# Patient Record
Sex: Male | Born: 1980 | Race: Black or African American | Hispanic: No | Smoking: Never smoker
Health system: Southern US, Community
[De-identification: ages and names within clinical notes are randomized; demographics above are authoritative.]

## PROBLEM LIST (undated history)

## (undated) DIAGNOSIS — E669 Obesity, unspecified: Secondary | ICD-10-CM

## (undated) DIAGNOSIS — K648 Other hemorrhoids: Secondary | ICD-10-CM

## (undated) DIAGNOSIS — K626 Ulcer of anus and rectum: Secondary | ICD-10-CM

## (undated) DIAGNOSIS — B20 Human immunodeficiency virus [HIV] disease: Secondary | ICD-10-CM

## (undated) DIAGNOSIS — Z21 Asymptomatic human immunodeficiency virus [HIV] infection status: Secondary | ICD-10-CM

## (undated) DIAGNOSIS — C8378 Burkitt lymphoma, lymph nodes of multiple sites: Secondary | ICD-10-CM

## (undated) DIAGNOSIS — K602 Anal fissure, unspecified: Secondary | ICD-10-CM

## (undated) HISTORY — DX: Obesity, unspecified: E66.9

## (undated) HISTORY — DX: Ulcer of anus and rectum: K62.6

## (undated) HISTORY — DX: Human immunodeficiency virus (HIV) disease: B20

## (undated) HISTORY — DX: Other hemorrhoids: K64.8

## (undated) HISTORY — DX: Anal fissure, unspecified: K60.2

## (undated) HISTORY — DX: Asymptomatic human immunodeficiency virus (hiv) infection status: Z21

---

## 2004-04-05 ENCOUNTER — Emergency Department (HOSPITAL_COMMUNITY): Admission: EM | Admit: 2004-04-05 | Discharge: 2004-04-05 | Payer: Self-pay | Admitting: Emergency Medicine

## 2005-04-18 ENCOUNTER — Emergency Department (HOSPITAL_COMMUNITY): Admission: EM | Admit: 2005-04-18 | Discharge: 2005-04-18 | Payer: Self-pay | Admitting: Emergency Medicine

## 2006-11-10 ENCOUNTER — Emergency Department (HOSPITAL_COMMUNITY): Admission: EM | Admit: 2006-11-10 | Discharge: 2006-11-11 | Payer: Self-pay | Admitting: Emergency Medicine

## 2007-03-11 ENCOUNTER — Emergency Department (HOSPITAL_COMMUNITY): Admission: EM | Admit: 2007-03-11 | Discharge: 2007-03-11 | Payer: Self-pay | Admitting: Emergency Medicine

## 2008-10-15 ENCOUNTER — Emergency Department (HOSPITAL_COMMUNITY): Admission: EM | Admit: 2008-10-15 | Discharge: 2008-10-15 | Payer: Self-pay | Admitting: Emergency Medicine

## 2010-08-30 ENCOUNTER — Emergency Department (HOSPITAL_COMMUNITY): Admission: EM | Admit: 2010-08-30 | Discharge: 2010-08-30 | Payer: Self-pay | Admitting: Family Medicine

## 2011-02-27 ENCOUNTER — Inpatient Hospital Stay (INDEPENDENT_AMBULATORY_CARE_PROVIDER_SITE_OTHER)
Admission: RE | Admit: 2011-02-27 | Discharge: 2011-02-27 | Disposition: A | Discharge status: Home / Self Care | Payer: Self-pay | Source: Ambulatory Visit | Attending: Family Medicine | Admitting: Family Medicine

## 2011-02-27 DIAGNOSIS — B9789 Other viral agents as the cause of diseases classified elsewhere: Secondary | ICD-10-CM

## 2011-02-27 DIAGNOSIS — K5289 Other specified noninfective gastroenteritis and colitis: Secondary | ICD-10-CM

## 2011-09-16 LAB — URINALYSIS, ROUTINE W REFLEX MICROSCOPIC
Bilirubin Urine: NEGATIVE
Hgb urine dipstick: NEGATIVE
Ketones, ur: NEGATIVE
Specific Gravity, Urine: 1.033 — ABNORMAL HIGH

## 2011-09-16 LAB — HERPES SIMPLEX VIRUS CULTURE: Culture: DETECTED

## 2012-06-03 ENCOUNTER — Emergency Department (HOSPITAL_COMMUNITY)
Admission: EM | Admit: 2012-06-03 | Discharge: 2012-06-04 | Disposition: A | Discharge status: Home / Self Care | Payer: No Typology Code available for payment source | Attending: Emergency Medicine | Admitting: Emergency Medicine

## 2012-06-03 ENCOUNTER — Emergency Department (HOSPITAL_COMMUNITY): Payer: No Typology Code available for payment source

## 2012-06-03 ENCOUNTER — Encounter (HOSPITAL_COMMUNITY): Payer: Self-pay

## 2012-06-03 DIAGNOSIS — M549 Dorsalgia, unspecified: Secondary | ICD-10-CM | POA: Insufficient documentation

## 2012-06-03 DIAGNOSIS — S161XXA Strain of muscle, fascia and tendon at neck level, initial encounter: Secondary | ICD-10-CM

## 2012-06-03 DIAGNOSIS — S20219A Contusion of unspecified front wall of thorax, initial encounter: Secondary | ICD-10-CM

## 2012-06-03 MED ORDER — IBUPROFEN 400 MG PO TABS
800.0000 mg | ORAL_TABLET | Freq: Once | ORAL | Status: AC
  Administered 2012-06-04: 800 mg via ORAL
  Filled 2012-06-03: qty 2

## 2012-06-03 MED ORDER — OXYCODONE-ACETAMINOPHEN 5-325 MG PO TABS
2.0000 | ORAL_TABLET | Freq: Once | ORAL | Status: AC
  Administered 2012-06-04: 2 via ORAL
  Filled 2012-06-03: qty 2

## 2012-06-03 NOTE — ED Notes (Signed)
Back pain and cramping in muscles, sts migraine headache.
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
## 2012-06-04 MED ORDER — IBUPROFEN 800 MG PO TABS: 600.0000 mg | ORAL_TABLET | Freq: Once | ORAL | Status: AC

## 2012-06-04 MED ORDER — OXYCODONE-ACETAMINOPHEN 5-325 MG PO TABS: 2.0000 | ORAL_TABLET | Freq: Four times a day (QID) | ORAL | Status: AC | PRN

## 2012-06-04 MED ORDER — CYCLOBENZAPRINE HCL 10 MG PO TABS: 5.0000 mg | ORAL_TABLET | Freq: Three times a day (TID) | ORAL | Status: AC | PRN

## 2012-06-04 NOTE — Discharge Instructions (Signed)
Your x-rays are all negative for any fractures, subluxation, or dislocations.  U. been placed in a soft cervical collar for comfort only.  Wear this for the next one to 2 days.  Pupils of the given a prescription for a nonsteroidal as well as a narcotic and muscle relaxer

## 2012-06-04 NOTE — ED Provider Notes (Signed)
Medical screening examination/treatment/procedure(s) were performed by non-physician practitioner and as supervising physician I was immediately available for consultation/collaboration.   Sallee Hogrefe, MD 06/04/12 0600 

## 2012-10-05 ENCOUNTER — Emergency Department (INDEPENDENT_AMBULATORY_CARE_PROVIDER_SITE_OTHER): Payer: Self-pay

## 2012-10-05 ENCOUNTER — Other Ambulatory Visit: Payer: Self-pay

## 2012-10-05 ENCOUNTER — Emergency Department (INDEPENDENT_AMBULATORY_CARE_PROVIDER_SITE_OTHER)
Admission: EM | Admit: 2012-10-05 | Discharge: 2012-10-05 | Disposition: A | Discharge status: Home / Self Care | Payer: Self-pay | Source: Home / Self Care | Attending: Family Medicine | Admitting: Family Medicine

## 2012-10-05 ENCOUNTER — Encounter (HOSPITAL_COMMUNITY): Payer: Self-pay | Admitting: *Deleted

## 2012-10-05 DIAGNOSIS — B349 Viral infection, unspecified: Secondary | ICD-10-CM

## 2012-10-05 DIAGNOSIS — B9789 Other viral agents as the cause of diseases classified elsewhere: Secondary | ICD-10-CM

## 2012-10-05 MED ORDER — HYDROCOD POLST-CHLORPHEN POLST 10-8 MG/5ML PO LQCR: 5.0000 mL | Freq: Two times a day (BID) | ORAL | Status: DC

## 2012-10-05 MED ORDER — ACETAMINOPHEN 325 MG PO TABS
ORAL_TABLET | ORAL | Status: AC
  Filled 2012-10-05: qty 2

## 2012-10-05 NOTE — ED Notes (Signed)
Re-checked patient's temp after x-ray was 103.1 RN and MD made aware

## 2012-10-05 NOTE — ED Provider Notes (Signed)
History     CSN: [id number]  Arrival date & time 10/05/12  1654   First MD Initiated Contact with Patient 10/05/12 1700      Chief Complaint  Patient presents with  . Chest Pain    (Consider location/radiation/quality/duration/timing/severity/associated sxs/prior treatment) Patient is a 30 y.o. male presenting with cough. The history is provided by the patient.  Cough This is a new problem. The current episode started 6 to 12 hours ago. The cough is non-productive. The maximum temperature recorded prior to his arrival was 100 to 100.9 F. The fever has been present for less than 1 day. Associated symptoms include chest pain and shortness of breath. Pertinent negatives include no rhinorrhea.    History reviewed. No pertinent past medical history.  History reviewed. No pertinent past surgical history.  Family History  Problem Relation Age of Onset  . Family history unknown: Yes    History  Substance Use Topics  . Smoking status: Never Smoker   . Smokeless tobacco: Not on file  . Alcohol Use: No      Review of Systems  Constitutional: Positive for fever.  HENT: Negative for congestion, rhinorrhea and postnasal drip.   Respiratory: Positive for cough and shortness of breath.   Cardiovascular: Positive for chest pain.  Gastrointestinal: Negative.     Allergies  Review of patient's allergies indicates no known allergies.  Home Medications   Current Outpatient Rx  Name Route Sig Dispense Refill  . HYDROCOD POLST-CPM POLST ER 10-8 MG/5ML PO LQCR Oral Take 5 mLs by mouth every 12 (twelve) hours. 115 mL 0    BP 123/64  Pulse 88  Temp 103.1 F (39.5 C) (Oral)  Resp 12  SpO2 99%  Physical Exam  Nursing note and vitals reviewed. Constitutional: He is oriented to person, place, and time. He appears well-developed and well-nourished.  HENT:  Head: Normocephalic.  Right Ear: External ear normal.  Left Ear: External ear normal.  Mouth/Throat: Oropharynx is clear  and moist.  Eyes: Conjunctivae normal are normal. Pupils are equal, round, and reactive to light.  Neck: Normal range of motion. Neck supple.  Cardiovascular: Normal rate, regular rhythm, normal heart sounds and intact distal pulses.   Pulmonary/Chest: Effort normal and breath sounds normal. He exhibits tenderness.  Abdominal: Soft. Bowel sounds are normal.  Lymphadenopathy:    He has no cervical adenopathy.  Neurological: He is alert and oriented to person, place, and time.  Skin: Skin is warm and dry.    ED Course  Procedures (including critical care time)  Labs Reviewed - No data to display Dg Chest 2 View  10/05/2012  *RADIOLOGY REPORT*  Clinical Data: Chest pain and fever  CHEST - 2 VIEW  Comparison: None.  Findings: Normal mediastinum and cardiac silhouette.  Normal pulmonary  vasculature.  No evidence of effusion, infiltrate, or pneumothorax.  No acute bony abnormality.  IMPRESSION: No acute cardiopulmonary process.   Original Report Authenticated By: Genevive Bi, M.D.      1. Viral syndrome       MDM  X-rays reviewed and report per radiologist.         Linna Hoff, MD 10/05/12 [phone]

## 2012-10-05 NOTE — ED Notes (Signed)
Pt given tylenol per vo from dr. Melrose Nakayama - temp 102.3

## 2012-10-05 NOTE — ED Notes (Signed)
Pt reports chest pain  - started at 630 am in the right arm - denies n/v/d/radiating pain

## 2013-02-02 ENCOUNTER — Emergency Department (HOSPITAL_COMMUNITY)
Admission: EM | Admit: 2013-02-02 | Discharge: 2013-02-02 | Disposition: A | Discharge status: Home / Self Care | Payer: No Typology Code available for payment source | Attending: Emergency Medicine | Admitting: Emergency Medicine

## 2013-02-02 ENCOUNTER — Emergency Department (HOSPITAL_COMMUNITY): Payer: No Typology Code available for payment source

## 2013-02-02 ENCOUNTER — Encounter (HOSPITAL_COMMUNITY): Payer: Self-pay | Admitting: Emergency Medicine

## 2013-02-02 DIAGNOSIS — S0990XA Unspecified injury of head, initial encounter: Secondary | ICD-10-CM | POA: Insufficient documentation

## 2013-02-02 DIAGNOSIS — Y92410 Unspecified street and highway as the place of occurrence of the external cause: Secondary | ICD-10-CM | POA: Insufficient documentation

## 2013-02-02 DIAGNOSIS — S335XXA Sprain of ligaments of lumbar spine, initial encounter: Secondary | ICD-10-CM | POA: Insufficient documentation

## 2013-02-02 DIAGNOSIS — S39012A Strain of muscle, fascia and tendon of lower back, initial encounter: Secondary | ICD-10-CM

## 2013-02-02 DIAGNOSIS — Y9389 Activity, other specified: Secondary | ICD-10-CM | POA: Insufficient documentation

## 2013-02-02 MED ORDER — CYCLOBENZAPRINE HCL 10 MG PO TABS: 10.0000 mg | ORAL_TABLET | Freq: Two times a day (BID) | ORAL | Status: DC | PRN

## 2013-02-02 MED ORDER — NAPROXEN 500 MG PO TABS: 500.0000 mg | ORAL_TABLET | Freq: Two times a day (BID) | ORAL | Status: DC
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
## 2013-02-03 NOTE — ED Provider Notes (Signed)
Medical screening examination/treatment/procedure(s) were performed by non-physician practitioner and as supervising physician I was immediately available for consultation/collaboration.  Esmerelda Finnigan T Zurisadai Helminiak, MD 02/03/13 2351 

## 2013-04-14 ENCOUNTER — Encounter (HOSPITAL_COMMUNITY): Payer: Self-pay | Admitting: *Deleted

## 2013-04-14 ENCOUNTER — Emergency Department (HOSPITAL_COMMUNITY)
Admission: EM | Admit: 2013-04-14 | Discharge: 2013-04-14 | Disposition: A | Discharge status: Home / Self Care | Payer: No Typology Code available for payment source | Attending: Emergency Medicine | Admitting: Emergency Medicine

## 2013-04-14 DIAGNOSIS — S335XXA Sprain of ligaments of lumbar spine, initial encounter: Secondary | ICD-10-CM | POA: Insufficient documentation

## 2013-04-14 DIAGNOSIS — S298XXA Other specified injuries of thorax, initial encounter: Secondary | ICD-10-CM | POA: Insufficient documentation

## 2013-04-14 DIAGNOSIS — Y92410 Unspecified street and highway as the place of occurrence of the external cause: Secondary | ICD-10-CM | POA: Insufficient documentation

## 2013-04-14 DIAGNOSIS — S39012A Strain of muscle, fascia and tendon of lower back, initial encounter: Secondary | ICD-10-CM

## 2013-04-14 DIAGNOSIS — Y9389 Activity, other specified: Secondary | ICD-10-CM | POA: Insufficient documentation

## 2013-04-14 MED ORDER — NAPROXEN 500 MG PO TABS: 500.0000 mg | ORAL_TABLET | Freq: Two times a day (BID) | ORAL | Status: DC

## 2013-04-14 MED ORDER — METHOCARBAMOL 500 MG PO TABS: 500.0000 mg | ORAL_TABLET | Freq: Two times a day (BID) | ORAL | Status: DC
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
## 2013-04-15 NOTE — ED Provider Notes (Signed)
Medical screening examination/treatment/procedure(s) were performed by non-physician practitioner and as supervising physician I was immediately available for consultation/collaboration.   Kirby Cortese, MD 04/15/13 0011 

## 2013-06-16 ENCOUNTER — Encounter (HOSPITAL_COMMUNITY): Payer: Self-pay | Admitting: *Deleted

## 2013-06-16 ENCOUNTER — Emergency Department (INDEPENDENT_AMBULATORY_CARE_PROVIDER_SITE_OTHER)
Admission: EM | Admit: 2013-06-16 | Discharge: 2013-06-16 | Disposition: A | Discharge status: Home / Self Care | Payer: Self-pay | Source: Home / Self Care | Attending: Emergency Medicine | Admitting: Emergency Medicine

## 2013-06-16 DIAGNOSIS — B9789 Other viral agents as the cause of diseases classified elsewhere: Secondary | ICD-10-CM

## 2013-06-16 DIAGNOSIS — B349 Viral infection, unspecified: Secondary | ICD-10-CM

## 2013-06-16 NOTE — ED Notes (Signed)
Reports waking this morning with some nausea.  Had one episode of vomiting this afternoon.  Denies any other sxs, pain, diarrhea, fevers.  Last BM today - had 2 normal BMs.

## 2013-06-16 NOTE — ED Provider Notes (Signed)
   History    CSN: [id number] Arrival date & time 06/16/13  1747  First MD Initiated Contact with Patient 06/16/13 1819     Chief Complaint  Patient presents with  . Nausea   (Consider location/radiation/quality/duration/timing/severity/associated sxs/prior Treatment) Patient is a 32 y.o. male presenting with vomiting. The history is provided by the patient. No language interpreter was used.  Emesis Severity:  Moderate Timing:  Constant Number of daily episodes:  1 Progression:  Worsening Chronicity:  New Recent urination:  Normal Relieved by:  Nothing Worsened by:  Nothing tried Pt reports he vomitted once.   Pt reports he ate several slices of pizza and feels better.  No further vomitting History reviewed. No pertinent past medical history. History reviewed. No pertinent past surgical history. No family history on file. History  Substance Use Topics  . Smoking status: Never Smoker   . Smokeless tobacco: Not on file  . Alcohol Use: No    Review of Systems  Gastrointestinal: Positive for vomiting.  All other systems reviewed and are negative.    Allergies  Review of patient's allergies indicates no known allergies.  Home Medications   Current Outpatient Rx  Name  Route  Sig  Dispense  Refill  . acetaminophen (TYLENOL) 500 MG tablet   Oral   Take 1,000 mg by mouth every 6 (six) hours as needed for pain.         . methocarbamol (ROBAXIN) 500 MG tablet   Oral   Take 1 tablet (500 mg total) by mouth 2 (two) times daily.   20 tablet   0   . methocarbamol (ROBAXIN) 500 MG tablet   Oral   Take 1 tablet (500 mg total) by mouth 2 (two) times daily.   20 tablet   0   . naproxen (NAPROSYN) 500 MG tablet   Oral   Take 1 tablet (500 mg total) by mouth 2 (two) times daily with a meal.   30 tablet   0   . naproxen (NAPROSYN) 500 MG tablet   Oral   Take 1 tablet (500 mg total) by mouth 2 (two) times daily with a meal.   30 tablet   0    BP 120/66  Pulse 67   Temp(Src) 98.4 F (36.9 C) (Oral)  Resp 20  SpO2 100% Physical Exam  Nursing note and vitals reviewed. Constitutional: He is oriented to person, place, and time. He appears well-developed and well-nourished.  HENT:  Head: Normocephalic and atraumatic.  Eyes: Pupils are equal, round, and reactive to light.  Neck: Normal range of motion.  Cardiovascular: Normal rate.   Pulmonary/Chest: Effort normal.  Abdominal: Soft.  Musculoskeletal: Normal range of motion.  Neurological: He is alert and oriented to person, place, and time. He has normal reflexes.  Skin: Skin is warm.  Psychiatric: He has a normal mood and affect.    ED Course  Procedures (including critical care time) Labs Reviewed - No data to display No results found. 1. Viral illness     MDM  Pt advised clear liquids x 12 hours.   Return if any problems.  Lonia Skinner [place], PA-C 06/16/13 1901

## 2013-06-16 NOTE — ED Provider Notes (Signed)
Medical screening examination/treatment/procedure(s) were performed by non-physician practitioner and as supervising physician I was immediately available for consultation/collaboration.  Leslee Home, M.D.  Reuben Likes, MD 06/16/13 [place]

## 2013-09-02 ENCOUNTER — Emergency Department (HOSPITAL_COMMUNITY)
Admission: EM | Admit: 2013-09-02 | Discharge: 2013-09-02 | Disposition: A | Discharge status: Home / Self Care | Payer: Self-pay | Attending: Emergency Medicine | Admitting: Emergency Medicine

## 2013-09-02 ENCOUNTER — Encounter (HOSPITAL_COMMUNITY): Payer: Self-pay | Admitting: Emergency Medicine

## 2013-09-02 DIAGNOSIS — N4889 Other specified disorders of penis: Secondary | ICD-10-CM

## 2013-09-02 DIAGNOSIS — N489 Disorder of penis, unspecified: Secondary | ICD-10-CM | POA: Insufficient documentation

## 2013-09-02 LAB — URINALYSIS, ROUTINE W REFLEX MICROSCOPIC
Bilirubin Urine: NEGATIVE
Glucose, UA: NEGATIVE mg/dL
Hgb urine dipstick: NEGATIVE
Nitrite: NEGATIVE
Specific Gravity, Urine: 1.025 (ref 1.005–1.030)
pH: 5.5 (ref 5.0–8.0)

## 2013-09-02 MED ORDER — DOXYCYCLINE HYCLATE 100 MG PO CAPS: 100.0000 mg | ORAL_CAPSULE | Freq: Two times a day (BID) | ORAL | Status: DC

## 2013-09-02 MED ORDER — CEFTRIAXONE SODIUM 250 MG IJ SOLR
250.0000 mg | Freq: Once | INTRAMUSCULAR | Status: AC
  Administered 2013-09-02: 250 mg via INTRAMUSCULAR
  Filled 2013-09-02: qty 250

## 2013-09-02 NOTE — ED Notes (Signed)
Pt c/o of lower abd pain along with burning in pelvic area and pain in testicles for about 2 days. Denies urinary problems.

## 2013-09-02 NOTE — ED Provider Notes (Signed)
CSN: [id number]     Arrival date & time 09/02/13  0803 History   First MD Initiated Contact with Patient 09/02/13 [phone]     Chief Complaint  Patient presents with  . Abdominal Pain   (Consider location/radiation/quality/duration/timing/severity/associated sxs/prior Treatment) HPI Patient is 2 days of penile pain. States he has no dysuria and no penile discharge. He has no testicular or scrotal pain. Denies fevers or chills. Patient's have recent unprotected sex and has been diagnosed with gonorrhea in the past. States the symptoms are exactly the same he was to gonorrhea. History reviewed. No pertinent past medical history. History reviewed. No pertinent past surgical history. No family history on file. History  Substance Use Topics  . Smoking status: Never Smoker   . Smokeless tobacco: Not on file  . Alcohol Use: No    Review of Systems  Constitutional: Negative for fever and chills.  Gastrointestinal: Negative for nausea, vomiting, abdominal pain, diarrhea and constipation.  Genitourinary: Positive for penile pain. Negative for dysuria, hematuria, flank pain, discharge, penile swelling, scrotal swelling and testicular pain.  Musculoskeletal: Negative for myalgias and back pain.  Skin: Negative for rash.  Neurological: Negative for numbness.  All other systems reviewed and are negative.    Allergies  Review of patient's allergies indicates no known allergies.  Home Medications  No current outpatient prescriptions on file. BP 138/67  Pulse 61  Temp(Src) 99 F (37.2 C) (Oral)  Resp 18  SpO2 96% Physical Exam  Nursing note and vitals reviewed. Constitutional: He is oriented to person, place, and time. He appears well-developed and well-nourished. No distress.  HENT:  Head: Normocephalic and atraumatic.  Mouth/Throat: Oropharynx is clear and moist.  Eyes: EOM are normal. Pupils are equal, round, and reactive to light.  Neck: Normal range of motion. Neck supple.   Cardiovascular: Normal rate and regular rhythm.   Pulmonary/Chest: Effort normal and breath sounds normal. No respiratory distress. He has no wheezes. He has no rales. He exhibits no tenderness.  Abdominal: Soft. Bowel sounds are normal. He exhibits no distension and no mass. There is no tenderness. There is no rebound and no guarding.  Genitourinary: Penis normal.  No penile discharge. No inguinal hernias present. The scrotal swelling or testicular tenderness. Testicles are in normal lie. No inguinal lymphadenopathy or rashes. No evidence of trauma.  Musculoskeletal: Normal range of motion. He exhibits no edema and no tenderness.  Neurological: He is alert and oriented to person, place, and time.  Skin: Skin is warm and dry. No rash noted. No erythema.  Psychiatric: He has a normal mood and affect. His behavior is normal.    ED Course  Procedures (including critical care time) Labs Review Labs Reviewed  URINALYSIS, ROUTINE W REFLEX MICROSCOPIC   Imaging Review No results found.  MDM  Patient declined GC culturing stating he just wished to be treated. He has no evidence of epididymitis or concern for torsion. He specifically denies any abdominal pain whatsoever. Symptoms are consistent with previous diagnosis of gonorrhea. Patient been advised to practice safe sex.  Patient to followup with [facility] to assure resolution of symptoms. Return precautions given.  Loren Racer, MD 09/02/13 [phone]

## 2013-09-02 NOTE — Progress Notes (Signed)
[facility] provided pt with a list of primary care resources and a [facility] [employer] application.  °

## 2014-05-18 ENCOUNTER — Emergency Department (HOSPITAL_COMMUNITY)
Admission: EM | Admit: 2014-05-18 | Discharge: 2014-05-18 | Disposition: A | Discharge status: Home / Self Care | Payer: Self-pay | Attending: Emergency Medicine | Admitting: Emergency Medicine

## 2014-05-18 ENCOUNTER — Emergency Department (HOSPITAL_COMMUNITY): Payer: Self-pay

## 2014-05-18 ENCOUNTER — Encounter (HOSPITAL_COMMUNITY): Payer: Self-pay | Admitting: Emergency Medicine

## 2014-05-18 DIAGNOSIS — S99929A Unspecified injury of unspecified foot, initial encounter: Secondary | ICD-10-CM

## 2014-05-18 DIAGNOSIS — Y92410 Unspecified street and highway as the place of occurrence of the external cause: Secondary | ICD-10-CM | POA: Insufficient documentation

## 2014-05-18 DIAGNOSIS — Y9389 Activity, other specified: Secondary | ICD-10-CM | POA: Insufficient documentation

## 2014-05-18 DIAGNOSIS — S336XXA Sprain of sacroiliac joint, initial encounter: Secondary | ICD-10-CM | POA: Insufficient documentation

## 2014-05-18 DIAGNOSIS — S8990XA Unspecified injury of unspecified lower leg, initial encounter: Secondary | ICD-10-CM | POA: Insufficient documentation

## 2014-05-18 DIAGNOSIS — Z792 Long term (current) use of antibiotics: Secondary | ICD-10-CM | POA: Insufficient documentation

## 2014-05-18 DIAGNOSIS — S99919A Unspecified injury of unspecified ankle, initial encounter: Secondary | ICD-10-CM

## 2014-05-18 DIAGNOSIS — S39012A Strain of muscle, fascia and tendon of lower back, initial encounter: Secondary | ICD-10-CM

## 2014-05-18 MED ORDER — IBUPROFEN 800 MG PO TABS: 800.0000 mg | ORAL_TABLET | Freq: Three times a day (TID) | ORAL | Status: DC
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
## 2014-05-18 NOTE — ED Provider Notes (Signed)
  Medical screening examination/treatment/procedure(s) were performed by non-physician practitioner and as supervising physician I was immediately available for consultation/collaboration.   EKG Interpretation None         Carmin Muskrat, MD 05/18/14 1545

## 2014-08-25 ENCOUNTER — Emergency Department (HOSPITAL_COMMUNITY): Payer: No Typology Code available for payment source

## 2014-08-25 ENCOUNTER — Encounter (HOSPITAL_COMMUNITY): Payer: Self-pay | Admitting: Emergency Medicine

## 2014-08-25 ENCOUNTER — Emergency Department (HOSPITAL_COMMUNITY)
Admission: EM | Admit: 2014-08-25 | Discharge: 2014-08-25 | Disposition: A | Discharge status: Home / Self Care | Payer: No Typology Code available for payment source | Attending: Emergency Medicine | Admitting: Emergency Medicine

## 2014-08-25 DIAGNOSIS — Z791 Long term (current) use of non-steroidal anti-inflammatories (NSAID): Secondary | ICD-10-CM | POA: Insufficient documentation

## 2014-08-25 DIAGNOSIS — S8010XA Contusion of unspecified lower leg, initial encounter: Secondary | ICD-10-CM | POA: Diagnosis not present

## 2014-08-25 DIAGNOSIS — S79919A Unspecified injury of unspecified hip, initial encounter: Secondary | ICD-10-CM | POA: Diagnosis present

## 2014-08-25 DIAGNOSIS — Y92410 Unspecified street and highway as the place of occurrence of the external cause: Secondary | ICD-10-CM | POA: Diagnosis not present

## 2014-08-25 DIAGNOSIS — R296 Repeated falls: Secondary | ICD-10-CM | POA: Diagnosis not present

## 2014-08-25 DIAGNOSIS — W19XXXA Unspecified fall, initial encounter: Secondary | ICD-10-CM

## 2014-08-25 DIAGNOSIS — Y9389 Activity, other specified: Secondary | ICD-10-CM | POA: Diagnosis not present

## 2014-08-25 DIAGNOSIS — S79929A Unspecified injury of unspecified thigh, initial encounter: Secondary | ICD-10-CM

## 2014-08-25 DIAGNOSIS — S8011XA Contusion of right lower leg, initial encounter: Secondary | ICD-10-CM

## 2014-08-25 MED ORDER — NAPROXEN 500 MG PO TABS: 500.0000 mg | ORAL_TABLET | Freq: Two times a day (BID) | ORAL | Status: DC

## 2014-08-25 MED ORDER — OXYCODONE-ACETAMINOPHEN 5-325 MG PO TABS
2.0000 | ORAL_TABLET | Freq: Once | ORAL | Status: AC
  Administered 2014-08-25: 2 via ORAL
  Filled 2014-08-25: qty 2

## 2014-08-25 MED ORDER — METHOCARBAMOL 500 MG PO TABS
500.0000 mg | ORAL_TABLET | Freq: Once | ORAL | Status: AC
  Administered 2014-08-25: 500 mg via ORAL
  Filled 2014-08-25: qty 1

## 2014-08-25 MED ORDER — METHOCARBAMOL 500 MG PO TABS: 500.0000 mg | ORAL_TABLET | Freq: Two times a day (BID) | ORAL | Status: DC

## 2014-08-25 MED ORDER — HYDROCODONE-ACETAMINOPHEN 5-325 MG PO TABS: 1.0000 | ORAL_TABLET | Freq: Four times a day (QID) | ORAL | Status: DC | PRN
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
## 2014-08-25 NOTE — Discharge Instructions (Signed)
Contusion °A contusion is a deep bruise. Contusions are the result of an injury that caused bleeding under the skin. The contusion may turn blue, purple, or yellow. Minor injuries will give you a painless contusion, but more severe contusions may stay painful and swollen for a few weeks.  °CAUSES  °A contusion is usually caused by a blow, trauma, or direct force to an area of the body. °SYMPTOMS  °· Swelling and redness of the injured area. °· Bruising of the injured area. °· Tenderness and soreness of the injured area. °· Pain. °DIAGNOSIS  °The diagnosis can be made by taking a history and physical exam. An X-ray, CT scan, or MRI may be needed to determine if there were any associated injuries, such as fractures. °TREATMENT  °Specific treatment will depend on what area of the body was injured. In general, the best treatment for a contusion is resting, icing, elevating, and applying cold compresses to the injured area. Over-the-counter medicines may also be recommended for pain control. Ask your caregiver what the best treatment is for your contusion. °HOME CARE INSTRUCTIONS  °· Put ice on the injured area. °· Put ice in a plastic bag. °· Place a towel between your skin and the bag. °· Leave the ice on for 15-20 minutes, 3-4 times a day, or as directed by your health care provider. °· Only take over-the-counter or prescription medicines for pain, discomfort, or fever as directed by your caregiver. Your caregiver may recommend avoiding anti-inflammatory medicines (aspirin, ibuprofen, and naproxen) for 48 hours because these medicines may increase bruising. °· Rest the injured area. °· If possible, elevate the injured area to reduce swelling. °SEEK IMMEDIATE MEDICAL CARE IF:  °· You have increased bruising or swelling. °· You have pain that is getting worse. °· Your swelling or pain is not relieved with medicines. °MAKE SURE YOU:  °· Understand these instructions. °· Will watch your condition. °· Will get help right  away if you are not doing well or get worse. °Document Released: 09/11/2005 Document Revised: 12/07/2013 Document Reviewed: 10/07/2011 °[employer]® Patient Information ©2015 [employer]. This information is not intended to replace advice given to you by your health care provider. Make sure you discuss any questions you have with your health care provider. °RICE: Routine Care for Injuries °The routine care of many injuries includes Rest, Ice, Compression, and Elevation (RICE). °HOME CARE INSTRUCTIONS °· Rest is needed to allow your body to heal. Routine activities can usually be resumed when comfortable. Injured tendons and bones can take up to 6 weeks to heal. Tendons are the cord-like structures that attach muscle to bone. °· Ice following an injury helps keep the swelling down and reduces pain. °¨ Put ice in a plastic bag. °¨ Place a towel between your skin and the bag. °¨ Leave the ice on for 15-20 minutes, 3-4 times a day, or as directed by your health care provider. Do this while awake, for the first 24 to 48 hours. After that, continue as directed by your caregiver. °· Compression helps keep swelling down. It also gives support and helps with discomfort. If an elastic bandage has been applied, it should be removed and reapplied every 3 to 4 hours. It should not be applied tightly, but firmly enough to keep swelling down. Watch fingers or toes for swelling, bluish discoloration, coldness, numbness, or excessive pain. If any of these problems occur, remove the bandage and reapply loosely. Contact your caregiver if these problems continue. °· Elevation helps reduce swelling   and decreases pain. With extremities, such as the arms, hands, legs, and feet, the injured area should be placed near or above the level of the heart, if possible. °SEEK IMMEDIATE MEDICAL CARE IF: °· You have persistent pain and swelling. °· You develop redness, numbness, or unexpected weakness. °· Your symptoms are getting worse rather than  improving after several days. °These symptoms may indicate that further evaluation or further X-rays are needed. Sometimes, X-rays may not show a small broken bone (fracture) until 1 week or 10 days later. Make a follow-up appointment with your caregiver. Ask when your X-ray results will be ready. Make sure you get your X-ray results. °Document Released: 03/16/2001 Document Revised: 12/07/2013 Document Reviewed: 05/03/2011 °[employer]® Patient Information ©2015 [employer]. This information is not intended to replace advice given to you by your health care provider. Make sure you discuss any questions you have with your health care provider. ° °
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
## 2014-08-29 ENCOUNTER — Emergency Department (INDEPENDENT_AMBULATORY_CARE_PROVIDER_SITE_OTHER)
Admission: EM | Admit: 2014-08-29 | Discharge: 2014-08-29 | Disposition: A | Discharge status: Home / Self Care | Payer: Self-pay | Source: Home / Self Care | Attending: Family Medicine | Admitting: Family Medicine

## 2014-08-29 ENCOUNTER — Encounter (HOSPITAL_COMMUNITY): Payer: Self-pay | Admitting: Emergency Medicine

## 2014-08-29 DIAGNOSIS — T887XXA Unspecified adverse effect of drug or medicament, initial encounter: Secondary | ICD-10-CM

## 2014-08-29 DIAGNOSIS — R110 Nausea: Secondary | ICD-10-CM

## 2014-08-29 DIAGNOSIS — M549 Dorsalgia, unspecified: Secondary | ICD-10-CM

## 2014-08-29 MED ORDER — ONDANSETRON 4 MG PO TBDP: 4.0000 mg | ORAL_TABLET | Freq: Four times a day (QID) | ORAL | Status: DC | PRN
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
## 2014-08-29 NOTE — Discharge Instructions (Signed)

## 2014-08-29 NOTE — ED Provider Notes (Signed)
Medical screening examination/treatment/procedure(s) were performed by resident physician or non-physician practitioner and as supervising physician I was immediately available for consultation/collaboration.   Pauline Good MD.   Billy Fischer, MD 08/29/14 2049
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
## 2015-04-10 ENCOUNTER — Emergency Department (HOSPITAL_COMMUNITY): Admission: EM | Admit: 2015-04-10 | Discharge: 2015-04-10 | Payer: Self-pay | Source: Home / Self Care

## 2016-03-15 ENCOUNTER — Emergency Department (HOSPITAL_COMMUNITY)
Admission: EM | Admit: 2016-03-15 | Discharge: 2016-03-15 | Disposition: A | Discharge status: Other Acute Inpatient Hospital | Payer: Self-pay | Source: Home / Self Care

## 2016-03-15 NOTE — ED Notes (Signed)
According    To     [facility]mer     Pt     Was  Supposed  To   Be  A    workmans  Comp    Case

## 2016-05-07 ENCOUNTER — Encounter (HOSPITAL_COMMUNITY): Payer: Self-pay | Admitting: Emergency Medicine

## 2016-05-07 ENCOUNTER — Emergency Department (HOSPITAL_COMMUNITY)
Admission: EM | Admit: 2016-05-07 | Discharge: 2016-05-07 | Disposition: A | Discharge status: Home / Self Care | Payer: No Typology Code available for payment source | Attending: Emergency Medicine | Admitting: Emergency Medicine

## 2016-05-07 DIAGNOSIS — Z79891 Long term (current) use of opiate analgesic: Secondary | ICD-10-CM | POA: Insufficient documentation

## 2016-05-07 DIAGNOSIS — M5416 Radiculopathy, lumbar region: Secondary | ICD-10-CM | POA: Insufficient documentation

## 2016-05-07 MED ORDER — METHOCARBAMOL 500 MG PO TABS
500.0000 mg | ORAL_TABLET | Freq: Once | ORAL | Status: AC
  Administered 2016-05-07: 500 mg via ORAL
  Filled 2016-05-07: qty 1

## 2016-05-07 MED ORDER — METHOCARBAMOL 500 MG PO TABS: 1000.0000 mg | ORAL_TABLET | Freq: Four times a day (QID) | ORAL | Status: DC | PRN

## 2016-05-07 MED ORDER — IBUPROFEN 200 MG PO TABS
400.0000 mg | ORAL_TABLET | Freq: Once | ORAL | Status: AC
  Administered 2016-05-07: 400 mg via ORAL
  Filled 2016-05-07: qty 2

## 2016-05-07 NOTE — Discharge Instructions (Signed)
For pain control you may take up to 800mg  of Motrin (also known as ibuprofen). That is usually 4 over the counter pills,  3 times a day. Take with food to minimize stomach irritation   You can also take  tylenol (acetaminophen) 975mg  (this is 3 over the counter pills) four times a day. Do not drink alcohol or combine with other medications that have acetaminophen as an ingredient (Read the labels!).    For breakthrough pain you may take Robaxin. Do not drink alcohol, drive or operate heavy machinery when taking Robaxin.  Please follow with your primary care doctor in the next 2 days for a check-up. They must obtain records for further management.   Do not hesitate to return to the Emergency Department for any new, worsening or concerning symptoms.    Lumbosacral Radiculopathy Lumbosacral radiculopathy is a condition that involves the spinal nerves and nerve roots in the low back and bottom of the spine. The condition develops when these nerves and nerve roots move out of place or become inflamed and cause symptoms. CAUSES This condition may be caused by:  Pressure from a disk that bulges out of place (herniated disk). A disk is a plate of cartilage that separates bones in the spine.  Disk degeneration.  A narrowing of the bones of the lower back (spinal stenosis).  A tumor.  An infection.  An injury that places sudden pressure on the disks that cushion the bones of your lower spine. RISK FACTORS This condition is more likely to develop in:  Males aged 30-50 years.  Females aged 79-60 years.  People who lift improperly.  People who are overweight or live a sedentary lifestyle.  People who smoke.  People who perform repetitive activities that strain the spine. SYMPTOMS Symptoms of this condition include:  Pain that goes down from the back into the legs (sciatica). This is the most common symptom. The pain may be worse with sitting, coughing, or sneezing.  Pain and numbness  in the arms and legs.  Muscle weakness.  Tingling.  Loss of bladder control or bowel control. DIAGNOSIS This condition is diagnosed with a physical exam and medical history. If the pain is lasting, you may have tests, such as:  MRI scan.  X-ray.  CT scan.  Myelogram.  Nerve conduction study. TREATMENT This condition is often treated with:  Hot packs and ice applied to affected areas.  Stretches to improve flexibility.  Exercises to strengthen back muscles.  Physical therapy.  Pain medicine.  A steroid injection in the spine. In some cases, no treatment is needed. If the condition is long-lasting (chronic), or if symptoms are severe, treatment may involve surgery or lifestyle changes, such as following a weight loss plan. HOME CARE INSTRUCTIONS Medicines  Take medicines only as directed by your health care provider.  Do not drive or operate heavy machinery while taking pain medicine. Injury Care  Apply a heat pack to the injured area as directed by your health care provider.  Apply ice to the affected area:  Put ice in a plastic bag.  Place a towel between your skin and the bag.  Leave the ice on for 20-30 minutes, every 2 hours while you are awake or as needed. Or, leave the ice on for as long as directed by your health care provider. Other Instructions  If you were shown how to do any exercises or stretches, do them as directed by your health care provider.  If your health care provider  prescribed a diet or exercise program, follow it as directed.  Keep all follow-up visits as directed by your health care provider. This is important. SEEK MEDICAL CARE IF:  Your pain does not improve over time even when taking pain medicines. SEEK IMMEDIATE MEDICAL CARE IF:  Your develop severe pain.  Your pain suddenly gets worse.  You develop increasing weakness in your legs.  You lose the ability to control your bladder or bowel.  You have difficulty walking  or balancing.  You have a fever.   This information is not intended to replace advice given to you by your health care provider. Make sure you discuss any questions you have with your health care provider.   Document Released: 12/02/2005 Document Revised: 04/18/2015 Document Reviewed: 11/28/2014 Elsevier Interactive Patient Education [employer].

## 2016-05-07 NOTE — ED Notes (Signed)
Pt has hx of work related back injury x 1 month. Back pain increased today after increased activity

## 2016-05-07 NOTE — ED Provider Notes (Signed)
CSN: [id number]     Arrival date & time 05/07/16  1834 History  By signing my name below, I, Rowan Blase, attest that this documentation has been prepared under the direction and in the presence of non-physician practitioner, Monico Blitz, PA-C. Electronically Signed: Rowan Blase, Scribe. 05/07/2016. 7:25 PM.   Chief Complaint  Patient presents with  . Back Pain    throbbing low back pain    The history is provided by the patient. No language interpreter was used.   HPI Comments:  Logan Ward is a 35 y.o. male who presents to the Emergency Department complaining of 8/10 lower back pain radiating into left buttocks onset today. 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 No alleviating factors noted or treatments attempted PTA. 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 Pt saw a doctor for follow-up a week ago. He is concerned that his current pain is an exacerbation of previous injury. Denies recent injury or trauma.  History reviewed. No pertinent past medical history. History reviewed. No pertinent past surgical history. Family History  Problem Relation Age of Onset  . Hypertension Father    Social History  Substance Use Topics  . Smoking status: Never Smoker   . Smokeless tobacco: None  . Alcohol Use: No    Review of Systems  10 systems reviewed and all are negative for acute change except as noted in the HPI.   Allergies  Review of patient's allergies indicates no known allergies.  Home Medications   Prior to Admission medications   Medication Sig Start Date End Date Taking? Authorizing Provider  doxycycline (VIBRAMYCIN) 100 MG capsule Take 1 capsule (100 mg total) by mouth 2 (two) times daily. One po bid x 7 days 09/02/13   Julianne Rice, MD  HYDROcodone-acetaminophen (NORCO/VICODIN) 5-325 MG per tablet Take 1 tablet by mouth every 6 (six) hours as  needed for moderate pain or severe pain. 08/25/14   Antonietta Breach, PA-C  ibuprofen (ADVIL,MOTRIN) 800 MG tablet Take 1 tablet (800 mg total) by mouth 3 (three) times daily. 05/18/14   Robyn M Hess, PA-C  methocarbamol (ROBAXIN) 500 MG tablet Take 1 tablet (500 mg total) by mouth 2 (two) times daily. 08/25/14   Antonietta Breach, PA-C  naproxen (NAPROSYN) 500 MG tablet Take 1 tablet (500 mg total) by mouth 2 (two) times daily. 08/25/14   Antonietta Breach, PA-C  ondansetron (ZOFRAN-ODT) 4 MG disintegrating tablet Take 1 tablet (4 mg total) by mouth every 6 (six) hours as needed for nausea. PRN for nausea or vomiting 08/29/14   Liam Graham, PA-C   BP 114/61 mmHg  Pulse 65  Temp(Src) 97.6 F (36.4 C) (Oral)  Resp 16  SpO2 99%   Physical Exam  Constitutional: He is oriented to person, place, and time. He appears well-developed and well-nourished.  HENT:  Head: Normocephalic.  Eyes: Conjunctivae and EOM are normal.  Neck: Normal range of motion.  Cardiovascular: Normal rate, regular rhythm and intact distal pulses.   Pulmonary/Chest: Effort normal.  Abdominal: Soft. He exhibits no distension. There is no tenderness.  Musculoskeletal: Normal range of motion.  Neurological: He is alert and oriented to person, place, and time.  No point tenderness to percussion of lumbar spinal processes.  No TTP or paraspinal muscular spasm. Strength is 5 out of 5 to bilateral lower extremities at hip and knee; extensor hallucis longus 5 out of 5. Ankle  strength 5 out of 5, no clonus, neurovascularly intact. No saddle anaesthesia. Patellar reflexes are 2+ bilaterally.      Psychiatric: He has a normal mood and affect.  Nursing note and vitals reviewed.   ED Course  Procedures  DIAGNOSTIC STUDIES:  Oxygen Saturation is 99% on RA, normal by my interpretation.    COORDINATION OF CARE:  7:22 PM Will start pt on high dose Ibuprofen. Discussed treatment plan with pt at bedside and pt agreed to plan.  Labs Review Labs  Reviewed - No data to display  Imaging Review No results found. I have personally reviewed and evaluated these images and lab results as part of my medical decision-making.   EKG Interpretation None      MDM   Final diagnoses:  Left lumbar radiculopathy    Filed Vitals:   05/07/16 1900  BP: 114/61  Pulse: 65  Temp: 97.6 F (36.4 C)  TempSrc: Oral  Resp: 16  SpO2: 99%    Medications  ibuprofen (ADVIL,MOTRIN) tablet 400 mg (400 mg Oral Given 05/07/16 1935)  methocarbamol (ROBAXIN) tablet 500 mg (500 mg Oral Given 05/07/16 1935)    Logan Ward is 35 y.o. male presenting with Exacerbation of back pain, radiates to the left gluteus. 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 Neuro exam without abnormality. Patient given Robaxin  Evaluation does not show pathology that would require ongoing emergent intervention or inpatient treatment. Pt is hemodynamically stable and mentating appropriately. Discussed findings and plan with patient/guardian, who agrees with care plan. All questions answered. Return precautions discussed and outpatient follow up given.    I personally performed the services described in this documentation, which was scribed in my presence. The recorded information has been reviewed and is accurate.   Monico Blitz, PA-C 05/07/16 2131  Julianne Rice, MD 05/07/16 2216

## 2016-06-27 ENCOUNTER — Encounter (HOSPITAL_COMMUNITY): Payer: Self-pay | Admitting: Emergency Medicine

## 2016-06-27 ENCOUNTER — Emergency Department (HOSPITAL_COMMUNITY)
Admission: EM | Admit: 2016-06-27 | Discharge: 2016-06-27 | Disposition: A | Discharge status: Home / Self Care | Payer: Self-pay | Attending: Emergency Medicine | Admitting: Emergency Medicine

## 2016-06-27 ENCOUNTER — Emergency Department (HOSPITAL_COMMUNITY): Payer: Self-pay

## 2016-06-27 DIAGNOSIS — R52 Pain, unspecified: Secondary | ICD-10-CM

## 2016-06-27 DIAGNOSIS — Z792 Long term (current) use of antibiotics: Secondary | ICD-10-CM | POA: Insufficient documentation

## 2016-06-27 DIAGNOSIS — Z791 Long term (current) use of non-steroidal anti-inflammatories (NSAID): Secondary | ICD-10-CM | POA: Insufficient documentation

## 2016-06-27 DIAGNOSIS — R509 Fever, unspecified: Secondary | ICD-10-CM | POA: Insufficient documentation

## 2016-06-27 DIAGNOSIS — Z79899 Other long term (current) drug therapy: Secondary | ICD-10-CM | POA: Insufficient documentation

## 2016-06-27 LAB — COMPREHENSIVE METABOLIC PANEL
ALT: 41 U/L (ref 17–63)
ANION GAP: 9 (ref 5–15)
AST: 55 U/L — ABNORMAL HIGH (ref 15–41)
Albumin: 4.8 g/dL (ref 3.5–5.0)
Alkaline Phosphatase: 75 U/L (ref 38–126)
BUN: 12 mg/dL (ref 6–20)
CHLORIDE: 103 mmol/L (ref 101–111)
CO2: 23 mmol/L (ref 22–32)
Calcium: 8.5 mg/dL — ABNORMAL LOW (ref 8.9–10.3)
Creatinine, Ser: 1.1 mg/dL (ref 0.61–1.24)
Glucose, Bld: 107 mg/dL — ABNORMAL HIGH (ref 65–99)
POTASSIUM: 4.1 mmol/L (ref 3.5–5.1)
SODIUM: 135 mmol/L (ref 135–145)
Total Bilirubin: 2.2 mg/dL — ABNORMAL HIGH (ref 0.3–1.2)
Total Protein: 8.1 g/dL (ref 6.5–8.1)

## 2016-06-27 LAB — CBC WITH DIFFERENTIAL/PLATELET
Basophils Absolute: 0 10*3/uL (ref 0.0–0.1)
Basophils Relative: 0 %
Eosinophils Absolute: 0 10*3/uL (ref 0.0–0.7)
Eosinophils Relative: 0 %
HEMATOCRIT: 41 % (ref 39.0–52.0)
HEMOGLOBIN: 13.3 g/dL (ref 13.0–17.0)
LYMPHS ABS: 0.7 10*3/uL (ref 0.7–4.0)
LYMPHS PCT: 22 %
MCH: 27.9 pg (ref 26.0–34.0)
MCHC: 32.4 g/dL (ref 30.0–36.0)
MCV: 86 fL (ref 78.0–100.0)
MONOS PCT: 10 %
Monocytes Absolute: 0.3 10*3/uL (ref 0.1–1.0)
NEUTROS ABS: 2.1 10*3/uL (ref 1.7–7.7)
NEUTROS PCT: 68 %
Platelets: 125 10*3/uL — ABNORMAL LOW (ref 150–400)
RBC: 4.77 MIL/uL (ref 4.22–5.81)
RDW: 12.9 % (ref 11.5–15.5)
WBC: 3 10*3/uL — ABNORMAL LOW (ref 4.0–10.5)

## 2016-06-27 LAB — URINALYSIS, ROUTINE W REFLEX MICROSCOPIC
Bilirubin Urine: NEGATIVE
Glucose, UA: NEGATIVE mg/dL
Ketones, ur: NEGATIVE mg/dL
Leukocytes, UA: NEGATIVE
Nitrite: NEGATIVE
PH: 6 (ref 5.0–8.0)
Protein, ur: 100 mg/dL — AB
SPECIFIC GRAVITY, URINE: 1.033 — AB (ref 1.005–1.030)

## 2016-06-27 LAB — URINE MICROSCOPIC-ADD ON

## 2016-06-27 LAB — MONONUCLEOSIS SCREEN: MONO SCREEN: NEGATIVE

## 2016-06-27 MED ORDER — SODIUM CHLORIDE 0.9 % IV BOLUS (SEPSIS)
1000.0000 mL | Freq: Once | INTRAVENOUS | Status: AC
  Administered 2016-06-27: 1000 mL via INTRAVENOUS

## 2016-06-27 MED ORDER — ACETAMINOPHEN 325 MG PO TABS
650.0000 mg | ORAL_TABLET | Freq: Once | ORAL | Status: AC | PRN
  Administered 2016-06-27: 650 mg via ORAL
  Filled 2016-06-27: qty 2

## 2016-06-27 NOTE — Progress Notes (Addendum)
CM spoke with pt who confirms uninsured [employer] resident with no pcp.  CM discussed and provided written information to assist pt with determining choice for uninsured accepting pcps, discussed the importance of pcp vs EDP services for f/u care, www.needymeds.org, www.goodrx.com, discounted pharmacies and other [employer] such as [employer] [facility] [employer], affordable care act, financial assistance, uninsured dental services, [name] med assist, DSS and  health department  Reviewed resources for [employer] uninsured accepting pcps like [facility], family medicine at [employer], [facility], [facility], local urgent care centers, [facility], [facility] family practice, general medical clinics, family services of the [place], [facility] urgent care plus others, medication resources, [facility] out patient pharmacies and housing Pt voiced understanding and appreciation of resources provided   Provided [facility] contact information  Entered in d/c instructions Please use the resources provided to you in emergency room by case manager to assist you're your choice of doctor for follow up These [employer] uninsured resources provide possible primary care providers, resources for discounted medications, housing, dental resources, affordable care act information, plus other resources for [employer]

## 2016-06-27 NOTE — Discharge Instructions (Signed)
Use ibuprofen or Tylenol as needed for fever and pain. Follow-up with your primary care provider or the health department within 2 days and be reevaluated. Or you may return to the emergency department to be reevaluated.  Her department if you experience uncontrolled fevers, neck pain, vomiting, diarrhea, abdominal pain, headache or visual changes.  Fever, Adult A fever is an increase in the body's temperature. It is usually defined as a temperature of 100F (38C) or higher. Brief mild or moderate fevers generally have no long-term effects, and they often do not require treatment. Moderate or high fevers may make you feel uncomfortable and can sometimes be a sign of a serious illness or disease. The sweating that may occur with repeated or prolonged fever may also cause dehydration. Fever is confirmed by taking a temperature with a thermometer. A measured temperature can vary with:  Age.  Time of day.  Location of the thermometer:  Mouth (oral).  Rectum (rectal).  Ear (tympanic).  Underarm (axillary).  Forehead (temporal). HOME CARE INSTRUCTIONS Pay attention to any changes in your symptoms. Take these actions to help with your condition:  Take over-the counter and prescription medicines only as told by your health care provider. Follow the dosing instructions carefully.  If you were prescribed an antibiotic medicine, take it as told by your health care provider. Do not stop taking the antibiotic even if you start to feel better.  Rest as needed.  Drink enough fluid to keep your urine clear or pale yellow. This helps to prevent dehydration.  Sponge yourself or bathe with room-temperature water to help reduce your body temperature as needed. Do not use ice water.  Do not overbundle yourself in blankets or heavy clothes. SEEK MEDICAL CARE IF:  You vomit.  You cannot eat or drink without vomiting.  You have diarrhea.  You have pain when you urinate.  Your symptoms do not  improve with treatment.  You develop new symptoms.  You develop excessive weakness. SEEK IMMEDIATE MEDICAL CARE IF:  You have shortness of breath or have trouble breathing.  You are dizzy or you faint.  You are disoriented or confused.  You develop signs of dehydration, such as a dry mouth, decreased urination, or paleness.  You develop severe pain in your abdomen.  You have persistent vomiting or diarrhea.  You develop a skin rash.  Your symptoms suddenly get worse.   This information is not intended to replace advice given to you by your health care provider. Make sure you discuss any questions you have with your health care provider.   Document Released: 05/28/2001 Document Revised: 08/23/2015 Document Reviewed: 01/26/2015 Elsevier Interactive Patient Education [employer].

## 2016-06-27 NOTE — Progress Notes (Signed)
Urine obtained and sent to the lab.(3pm)

## 2016-06-27 NOTE — ED Notes (Addendum)
Pt reports fatigue, generalized body aches, fevers, intermittent blurred vision, intermittent nausea (no v/d), and intermittent bilateral leg numbness for the past 3 days. Fever in triage, took aleve this am. Denies cough, sore throat or abd pain.

## 2016-06-27 NOTE — ED Provider Notes (Signed)
CSN: [id number]     Arrival date & time 06/27/16  0940 History  By signing my name below, I, Eustaquio Maize, attest that this documentation has been prepared under the direction and in the presence of [employer], PA-C.  Electronically Signed: Eustaquio Maize, ED Scribe. 06/27/2016. 12:31 PM.   Chief Complaint  Patient presents with  . Generalized Body Aches  . Fever   The history is provided by the patient. No language interpreter was used.    HPI Comments: Logan Ward is a 35 y.o. male who presents to the Emergency Department complaining of gradual onset, constant, increased fatigue x 3 days. Pt complains of Lightheadedness, sweats, nausea, fever with tmax of 101 yesterday, body aches. Had a headache 3 days ago but it resolved. He also complains of an intermittent pins and needle sensation in his entire body and a chalky taste in his mouth. Pt's temperature in the ED is 100.9. He has been taking Motrin and Aleve with mild relief. 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 No recent tick bite or foreign travel. No new sexual encounters. Denies vomiting, sore throat, pain with swallowing, difficulty swallowing, congestion, rhinorrhea, back pain, neck pain, neck stiffness, eye pain, chest pain, shortness of breath, diarrhea, abdominal pain, post nasal drip, cough, rash, dysuria, penile discharge, genital sores, ear pain, ear discharge, weakness, photophobia, phonophobia, or any other associated symptoms. Pt is not immunocompromised. No recent steroid use.   History reviewed. No pertinent past medical history. History reviewed. No pertinent past surgical history. Family History  Problem Relation Age of Onset  . Hypertension Father    Social History  Substance Use Topics  . Smoking status: Never Smoker   . Smokeless tobacco: None  . Alcohol Use: No    Review of Systems  Constitutional: Positive for fever, diaphoresis and fatigue.  HENT: Negative for congestion, ear  discharge, ear pain, postnasal drip, rhinorrhea, sore throat and trouble swallowing.   Eyes: Negative for photophobia.  Respiratory: Negative for cough and shortness of breath.   Cardiovascular: Negative for chest pain.  Gastrointestinal: Positive for nausea. Negative for vomiting, abdominal pain and diarrhea.  Genitourinary: Negative for dysuria, discharge and genital sores.  Musculoskeletal: Positive for myalgias. Negative for back pain, neck pain and neck stiffness.  Skin: Negative for rash.  Neurological: Positive for light-headedness and headaches. Negative for dizziness, syncope, weakness and numbness.    Allergies  Review of patient's allergies indicates no known allergies.  Home Medications   Prior to Admission medications   Medication Sig Start Date End Date Taking? Authorizing Provider  doxycycline (VIBRAMYCIN) 100 MG capsule Take 1 capsule (100 mg total) by mouth 2 (two) times daily. One po bid x 7 days 09/02/13   Julianne Rice, MD  HYDROcodone-acetaminophen (NORCO/VICODIN) 5-325 MG per tablet Take 1 tablet by mouth every 6 (six) hours as needed for moderate pain or severe pain. 08/25/14   Antonietta Breach, PA-C  ibuprofen (ADVIL,MOTRIN) 800 MG tablet Take 1 tablet (800 mg total) by mouth 3 (three) times daily. 05/18/14   Robyn M Hess, PA-C  methocarbamol (ROBAXIN) 500 MG tablet Take 2 tablets (1,000 mg total) by mouth 4 (four) times daily as needed (Pain). 05/07/16   Nicole Pisciotta, PA-C  naproxen (NAPROSYN) 500 MG tablet Take 1 tablet (500 mg total) by mouth 2 (two) times daily. 08/25/14   Antonietta Breach, PA-C  ondansetron (ZOFRAN-ODT) 4 MG disintegrating tablet Take 1 tablet (4 mg total) by mouth every 6 (six)  hours as needed for nausea. PRN for nausea or vomiting 08/29/14   Liam Graham, PA-C   BP 104/58 mmHg  Pulse 102  Temp(Src) 100.9 F (38.3 C) (Oral)  Resp 16  Ht _0  (1.854 m)  Wt 254 lb (115.214 kg)  BMI 33.52 kg/m2  SpO2 98%   Physical Exam  Constitutional: He is  oriented to person, place, and time. He appears well-developed and well-nourished. No distress.  HENT:  Head: Normocephalic and atraumatic.  Mouth/Throat: Posterior oropharyngeal edema present. No oropharyngeal exudate or posterior oropharyngeal erythema.  Mild tonsillar edema  Eyes: Conjunctivae are normal. Pupils are equal, round, and reactive to light.  Cardiovascular: Normal rate, regular rhythm and normal heart sounds.   Pulmonary/Chest: Effort normal and breath sounds normal. No respiratory distress. He has no wheezes. He has no rales.  Abdominal: Soft. There is no tenderness.  Lymphadenopathy:    He has no cervical adenopathy.  Neurological: He is alert and oriented to person, place, and time. No cranial nerve deficit. Coordination normal.  Skin: No rash noted.    ED Course  Procedures (including critical care time)  DIAGNOSTIC STUDIES: Oxygen Saturation is 98% on RA, normal by my interpretation.    COORDINATION OF CARE: 12:27 PM-Discussed treatment plan which includes mono spot with pt at bedside and pt agreed to plan.   Labs Review Labs Reviewed  COMPREHENSIVE METABOLIC PANEL - Abnormal; Notable for the following:    Glucose, Bld 107 (*)    Calcium 8.5 (*)    AST 55 (*)    Total Bilirubin 2.2 (*)    All other components within normal limits  CBC WITH DIFFERENTIAL/PLATELET - Abnormal; Notable for the following:    WBC 3.0 (*)    Platelets 125 (*)    All other components within normal limits  URINALYSIS, ROUTINE W REFLEX MICROSCOPIC (NOT AT [facility]) - Abnormal; Notable for the following:    Color, Urine AMBER (*)    APPearance CLOUDY (*)    Specific Gravity, Urine 1.033 (*)    Hgb urine dipstick TRACE (*)    Protein, ur 100 (*)    All other components within normal limits  URINE MICROSCOPIC-ADD ON - Abnormal; Notable for the following:    Squamous Epithelial / LPF 0-5 (*)    Bacteria, UA RARE (*)    All other components within normal limits  URINE CULTURE   MONONUCLEOSIS SCREEN    Imaging Review Dg Chest 2 View  06/27/2016  CLINICAL DATA:  Fever EXAM: CHEST  2 VIEW COMPARISON:  10/05/2012 FINDINGS: Cardiomediastinal silhouette is stable. No infiltrate or pleural effusion. No pulmonary edema. Bony thorax is unremarkable. IMPRESSION: No active cardiopulmonary disease. Electronically Signed   By: Lahoma Crocker M.D.   On: 06/27/2016 14:10   I have personally reviewed and evaluated these images and lab results as part of my medical decision-making.   EKG Interpretation None      MDM   Final diagnoses:  Fever, unspecified fever cause  Body aches   Patient presented with fever and tachycardia. Patient met SIRS criteria. Labs were drawn. Patient was given a liter of fluids. No leukocytosis, no acute findings on chest x-ray, urine with trace hemoglobin and rare bacteria. Unsure at this time of etiology of fever.  We'll culture urine. Mildly elevated AST and bilirubin. Other labs unremarkable. Monospot negative.  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  Patient's vital signs improved while in the ED. After liter  fluids patient was no longer febrile or tachycardic and had stable blood pressure. Patient well-appearing in no acute distress. Instructed patient to follow up with his primary care provider within 2 days to have his labs redrawn and to recheck his urine to determine the etiology of the blood in his urine. Also discussed the elevated liver enzyme and bilirubin with the patient and instructed him to follow-up with his primary care provider regarding these labs. Discussed strict return precautions to the ED. Patient was understanding to the discharge instructions.  Case discussed with Dr. Wilson Singer who agrees with the above plan.  I personally performed the services described in this documentation, which was scribed in my presence. The recorded information has been reviewed and is accurate.        Kalman Drape,  PA 06/27/16 2158  Virgel Manifold, MD 06/28/16 [phone]

## 2016-06-29 LAB — URINE CULTURE: Special Requests: NORMAL

## 2016-07-03 ENCOUNTER — Emergency Department (HOSPITAL_COMMUNITY): Payer: No Typology Code available for payment source

## 2016-07-03 ENCOUNTER — Emergency Department (HOSPITAL_COMMUNITY)
Admission: EM | Admit: 2016-07-03 | Discharge: 2016-07-03 | Disposition: A | Discharge status: Home / Self Care | Payer: Self-pay | Attending: Emergency Medicine | Admitting: Emergency Medicine

## 2016-07-03 ENCOUNTER — Encounter (HOSPITAL_COMMUNITY): Payer: Self-pay | Admitting: Emergency Medicine

## 2016-07-03 DIAGNOSIS — S86911A Strain of unspecified muscle(s) and tendon(s) at lower leg level, right leg, initial encounter: Secondary | ICD-10-CM | POA: Insufficient documentation

## 2016-07-03 DIAGNOSIS — Y999 Unspecified external cause status: Secondary | ICD-10-CM | POA: Insufficient documentation

## 2016-07-03 DIAGNOSIS — Y9389 Activity, other specified: Secondary | ICD-10-CM | POA: Insufficient documentation

## 2016-07-03 DIAGNOSIS — Y92410 Unspecified street and highway as the place of occurrence of the external cause: Secondary | ICD-10-CM | POA: Insufficient documentation

## 2016-07-03 DIAGNOSIS — T148XXA Other injury of unspecified body region, initial encounter: Secondary | ICD-10-CM

## 2016-07-03 DIAGNOSIS — S39012A Strain of muscle, fascia and tendon of lower back, initial encounter: Secondary | ICD-10-CM | POA: Insufficient documentation

## 2016-07-03 DIAGNOSIS — S161XXA Strain of muscle, fascia and tendon at neck level, initial encounter: Secondary | ICD-10-CM | POA: Insufficient documentation

## 2016-07-03 MED ORDER — NAPROXEN 500 MG PO TABS: 500.0000 mg | ORAL_TABLET | Freq: Two times a day (BID) | ORAL | Status: DC

## 2016-07-03 MED ORDER — HYDROCODONE-ACETAMINOPHEN 5-325 MG PO TABS
1.0000 | ORAL_TABLET | ORAL | Status: AC
  Administered 2016-07-03: 1 via ORAL
  Filled 2016-07-03: qty 1

## 2016-07-03 MED ORDER — CYCLOBENZAPRINE HCL 10 MG PO TABS: 10.0000 mg | ORAL_TABLET | Freq: Three times a day (TID) | ORAL | Status: DC | PRN
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
## 2016-07-03 NOTE — ED Notes (Signed)
Patient transported to X-ray 

## 2016-07-03 NOTE — Discharge Instructions (Signed)

## 2016-12-30 ENCOUNTER — Encounter (HOSPITAL_COMMUNITY): Payer: Self-pay | Admitting: *Deleted

## 2016-12-30 ENCOUNTER — Ambulatory Visit (HOSPITAL_COMMUNITY)
Admission: EM | Admit: 2016-12-30 | Discharge: 2016-12-30 | Disposition: A | Discharge status: Home / Self Care | Payer: Self-pay | Attending: Family Medicine | Admitting: Family Medicine

## 2016-12-30 DIAGNOSIS — K5900 Constipation, unspecified: Secondary | ICD-10-CM

## 2016-12-30 DIAGNOSIS — K644 Residual hemorrhoidal skin tags: Secondary | ICD-10-CM

## 2016-12-30 MED ORDER — POLYETHYLENE GLYCOL 3350 17 G PO PACK: 17.0000 g | PACK | Freq: Every day | ORAL | 0 refills | Status: DC

## 2016-12-30 MED ORDER — HYDROCORTISONE ACETATE 25 MG RE SUPP: 25.0000 mg | Freq: Two times a day (BID) | RECTAL | 0 refills | Status: DC | PRN

## 2016-12-30 NOTE — ED Provider Notes (Signed)
CSN: [id number]     Arrival date & time 12/30/16  1447 History   First MD Initiated Contact with Patient 12/30/16 1736     Chief Complaint  Patient presents with  . Rectal Bleeding   (Consider location/radiation/quality/duration/timing/severity/associated sxs/prior Treatment) HPI  Logan Ward is a 36 y.o. male presenting to UC with c/o rectal pain and constipation for about 1 week.  He has noticed small amount of blood on his stool and on the toilet paper after a BM.  Denies fever, chills, abdominal pain, nausea or vomiting.  He did take 2 stool softeners but notes he has purposely tried not to have a BM the last 2 days due to fear of the pain with his BMs.  He has tried Preparation-H w/o relief as he states the tingling sensation makes him feel like he has to have a BM.   History reviewed. No pertinent past medical history. History reviewed. No pertinent surgical history. Family History  Problem Relation Age of Onset  . Hypertension Father    Social History  Substance Use Topics  . Smoking status: Never Smoker  . Smokeless tobacco: Not on file  . Alcohol use No    Review of Systems  Constitutional: Negative for chills and fever.  Gastrointestinal: Positive for anal bleeding, blood in stool, constipation and rectal pain. Negative for abdominal pain, diarrhea, nausea and vomiting.  Genitourinary: Negative for frequency, hematuria and urgency.  Musculoskeletal: Negative for back pain and myalgias.    Allergies  Patient has no known allergies.  Home Medications   Prior to Admission medications   Medication Sig Start Date End Date Taking? Authorizing Provider  cyclobenzaprine (FLEXERIL) 10 MG tablet Take 1 tablet (10 mg total) by mouth 3 (three) times daily as needed for muscle spasms. 07/03/16   Dorie Rank, MD  doxycycline (VIBRAMYCIN) 100 MG capsule Take 1 capsule (100 mg total) by mouth 2 (two) times daily. One po bid x 7 days Patient not taking: Reported on 07/03/2016 09/02/13    Julianne Rice, MD  HYDROcodone-acetaminophen (NORCO/VICODIN) 5-325 MG per tablet Take 1 tablet by mouth every 6 (six) hours as needed for moderate pain or severe pain. Patient not taking: Reported on 07/03/2016 08/25/14   Antonietta Breach, PA-C  hydrocortisone (ANUSOL-HC) 25 MG suppository Place 1 suppository (25 mg total) rectally 2 (two) times daily as needed for hemorrhoids or itching. For 7 days 12/30/16   Noland Fordyce, PA-C  naproxen (NAPROSYN) 500 MG tablet Take 1 tablet (500 mg total) by mouth 2 (two) times daily. 07/03/16   Dorie Rank, MD  ondansetron (ZOFRAN-ODT) 4 MG disintegrating tablet Take 1 tablet (4 mg total) by mouth every 6 (six) hours as needed for nausea. PRN for nausea or vomiting Patient not taking: Reported on 07/03/2016 08/29/14   Liam Graham, PA-C  polyethylene glycol (MIRALAX / GLYCOLAX) packet Take 17 g by mouth daily. For up to 1 week 12/30/16   Noland Fordyce, PA-C   Meds Ordered and Administered this Visit  Medications - No data to display  BP 122/70 (BP Location: Left Arm)   Pulse 81   Temp 99.8 F (37.7 C) (Oral)   Resp 16   SpO2 100%  No data found.   Physical Exam  Constitutional: He is oriented to person, place, and time. He appears well-developed and well-nourished. No distress.  HENT:  Head: Normocephalic and atraumatic.  Eyes: EOM are normal.  Neck: Normal range of motion.  Cardiovascular: Normal rate.   Pulmonary/Chest: Effort normal.  Genitourinary:  Genitourinary Comments: External exam: pea-sized external hemorrhoid with raw appearing skin. Scant red blood present on hemorrhoid. No surrounding erythema, tenderness or induration.   Musculoskeletal: Normal range of motion.  Neurological: He is alert and oriented to person, place, and time.  Skin: Skin is warm and dry. He is not diaphoretic.  Psychiatric: He has a normal mood and affect. His behavior is normal.  Nursing note and vitals reviewed.   Urgent Care Course   Clinical Course      Procedures (including critical care time)  Labs Review Labs Reviewed - No data to display  Imaging Review No results found.    MDM   1. External hemorrhoid   2. Constipation, unspecified constipation type    Hx and exam c/w external hemorrhoid.  Advised pt to use stool softener to help ease pain with BMs. Also encouraged sitz baths to help ease discomfort.   May stop using the Preparation-H and start trying prescribed anusol suppositories.   F/u with PCP or [facility] if symptoms not improving in 1 week. Patient verbalized understanding and agreement with treatment plan. Pt info packet on constipation and hemorrhoids provided.     Noland Fordyce, PA-C 12/30/16 [id number]

## 2016-12-30 NOTE — ED Triage Notes (Signed)
Rectal  Pain      And  Bleeding  Not  releived   By       Prep    h    Pt  Has been   Constipated    Prior     To  The  Episode

## 2017-01-03 ENCOUNTER — Encounter (HOSPITAL_COMMUNITY): Payer: Self-pay | Admitting: Emergency Medicine

## 2017-01-03 DIAGNOSIS — K649 Unspecified hemorrhoids: Secondary | ICD-10-CM | POA: Insufficient documentation

## 2017-01-03 DIAGNOSIS — Z79899 Other long term (current) drug therapy: Secondary | ICD-10-CM | POA: Insufficient documentation

## 2017-01-03 NOTE — ED Triage Notes (Signed)
Pt reports being seen at UC 3 days ago for ractal pain and was diagnosed with internal hemorrhoids. Pt has been using medications with no relief. Pt feels as if they have moved further upward.

## 2017-01-04 ENCOUNTER — Emergency Department (HOSPITAL_COMMUNITY)
Admission: EM | Admit: 2017-01-04 | Discharge: 2017-01-04 | Disposition: A | Discharge status: Home / Self Care | Payer: Self-pay | Attending: Emergency Medicine | Admitting: Emergency Medicine

## 2017-01-04 DIAGNOSIS — K649 Unspecified hemorrhoids: Secondary | ICD-10-CM

## 2017-01-04 MED ORDER — DIBUCAINE 1 % RE OINT: 1.0000 "application " | TOPICAL_OINTMENT | RECTAL | 0 refills | Status: DC | PRN

## 2017-01-04 MED ORDER — PSYLLIUM 28 % PO PACK: 1.0000 | PACK | Freq: Two times a day (BID) | ORAL | 0 refills | Status: DC

## 2017-01-04 NOTE — Discharge Instructions (Signed)
Take your medications as prescribed. I also recommend continuing to use your Anusol suppositories to help continue to keep your stool soft.  Continue drinking fluids at home to remain hydrated (1.5-2L per day). I recommend following up with her primary care provider's office listed below within the next week if your symptoms improve. If your symptoms continue to worsen I recommend following up with the general surgery clinic listed below. Return to the emergency department if symptoms significantly worsen or new onset of fever, abdominal pain, vomiting, rectal bleeding, unable to have a bowel movement.

## 2017-01-04 NOTE — ED Provider Notes (Signed)
[facility] DEPT Provider Note   CSN: [id number] Arrival date & time: 01/03/17  1955 By signing my name below, I, Dyke Brackett, attest that this documentation has been prepared under the direction and in the presence of non-physician practitioner, Harlene Ramus, PA-C. Electronically Signed: Dyke Brackett, Scribe. 01/04/2017. 1:48 AM.   History   Chief Complaint Chief Complaint  Patient presents with  . Rectal Pain   HPI Logan Ward is a 36 y.o. male who presents to the Emergency Department complaining of persistent, moderate rectal pain x1.5 weeks. Pt was seen at Urgent Care 3 days ago for rectal pain and was diagnosed with external hemorrhoids. He has taken anusol suppository, applied preparation-H, and taken sitz baths with no relief.  Pt notes associated constipation x 1 week. He states he had a "small drop" of blood in his stool one week ago. He has been taking Miralax daily with some relief of constipation and states he is now having loose BMs.   He has never had this problem before. He denies fever, abdominal pain, nausea, vomiting, or any other associated symptoms.   The history is provided by the patient. No language interpreter was used.   History reviewed. No pertinent past medical history.  There are no active problems to display for this patient.  History reviewed. No pertinent surgical history.   Home Medications    Prior to Admission medications   Medication Sig Start Date End Date Taking? Authorizing Provider  hydrocortisone (ANUSOL-HC) 25 MG suppository Place 1 suppository (25 mg total) rectally 2 (two) times daily as needed for hemorrhoids or itching. For 7 days 12/30/16  Yes Noland Fordyce, PA-C  polyethylene glycol (MIRALAX / GLYCOLAX) packet Take 17 g by mouth daily. For up to 1 week 12/30/16  Yes Noland Fordyce, PA-C  cyclobenzaprine (FLEXERIL) 10 MG tablet Take 1 tablet (10 mg total) by mouth 3 (three) times daily as needed for muscle spasms. Patient not  taking: Reported on 01/04/2017 07/03/16   Dorie Rank, MD  dibucaine (NUPERCAINAL) 1 % OINT Place 1 application rectally as needed for pain. Do not use for more than 1 week 01/04/17   Nona Dell, PA-C  doxycycline (VIBRAMYCIN) 100 MG capsule Take 1 capsule (100 mg total) by mouth 2 (two) times daily. One po bid x 7 days Patient not taking: Reported on 07/03/2016 09/02/13   Julianne Rice, MD  HYDROcodone-acetaminophen (NORCO/VICODIN) 5-325 MG per tablet Take 1 tablet by mouth every 6 (six) hours as needed for moderate pain or severe pain. Patient not taking: Reported on 07/03/2016 08/25/14   Antonietta Breach, PA-C  naproxen (NAPROSYN) 500 MG tablet Take 1 tablet (500 mg total) by mouth 2 (two) times daily. Patient not taking: Reported on 01/04/2017 07/03/16   Dorie Rank, MD  ondansetron (ZOFRAN-ODT) 4 MG disintegrating tablet Take 1 tablet (4 mg total) by mouth every 6 (six) hours as needed for nausea. PRN for nausea or vomiting Patient not taking: Reported on 07/03/2016 08/29/14   Liam Graham, PA-C  psyllium (METAMUCIL SMOOTH TEXTURE) 28 % packet Take 1 packet by mouth 2 (two) times daily. 01/04/17   Nona Dell, PA-C    Family History Family History  Problem Relation Age of Onset  . Hypertension Father     Social History Social History  Substance Use Topics  . Smoking status: Never Smoker  . Smokeless tobacco: Not on file  . Alcohol use No     Allergies   Patient has no known allergies.  Review of Systems Review of Systems  Gastrointestinal: Positive for blood in stool, constipation, diarrhea and rectal pain.  Skin: Positive for wound.  All other systems reviewed and are negative.  Physical Exam Updated Vital Signs BP 118/58 (BP Location: Right Arm)   Pulse 73   Temp 98.9 F (37.2 C) (Oral)   Resp 18   SpO2 96%   Physical Exam  Constitutional: He is oriented to person, place, and time. He appears well-developed and well-nourished.  HENT:  Head:  Normocephalic and atraumatic.  Eyes: Conjunctivae and EOM are normal. Right eye exhibits no discharge. Left eye exhibits no discharge. No scleral icterus.  Cardiovascular: Normal rate, regular rhythm, normal heart sounds and intact distal pulses.   Pulmonary/Chest: Effort normal and breath sounds normal. No respiratory distress. He has no wheezes. He has no rales. He exhibits no tenderness.  Abdominal: Soft. Bowel sounds are normal. He exhibits no distension and no mass. There is no tenderness. There is no rebound and no guarding. No hernia.  Genitourinary: Rectal exam shows external hemorrhoid, internal hemorrhoid and tenderness. Rectal exam shows no fissure.  Genitourinary Comments: Two small non thrombose external hemorrhoids present and one small internal hemorrhoid present. TTP. No gross bleeding.   Musculoskeletal: He exhibits no edema.  Neurological: He is alert and oriented to person, place, and time.  Skin: Skin is warm and dry.  Nursing note and vitals reviewed.  ED Treatments / Results  DIAGNOSTIC STUDIES:  Oxygen Saturation is 96% on RA, adequate by my interpretation.    COORDINATION OF CARE:  1:43 AM Discussed treatment plan with pt at bedside and pt agreed to plan.  Labs (all labs ordered are listed, but only abnormal results are displayed) Labs Reviewed - No data to display  EKG  EKG Interpretation None       Radiology No results found.  Procedures Procedures (including critical care time)  Medications Ordered in ED Medications - No data to display   Initial Impression / Assessment and Plan / ED Course  I have reviewed the triage vital signs and the nursing notes.  Pertinent labs & imaging results that were available during my care of the patient were reviewed by me and considered in my medical decision making (see chart for details).     Patient presents with hemorrhoids on exam. VSS. Patient appears to have two small non-thrombosed external hemorrhoids  and one small nonthrombosed internal hemorrhoid, no active bleeding present. Chart review shows patient was seen at urgent care on 1/15 and was given rx of hydrocortisone cream and anusol suppositories. Reports using the medications without relief. Advised patient to continue using sitz baths at home along with his stool softener. Plan to discharge patient home with dibucaine ointment and continued symptomatically treatment. Patient given information to follow up with PCP outpatient for follow-up evaluation. Discussed return precautions.   Final Clinical Impressions(s) / ED Diagnoses   Final diagnoses:  Hemorrhoids, unspecified hemorrhoid type    New Prescriptions New Prescriptions   DIBUCAINE (NUPERCAINAL) 1 % OINT    Place 1 application rectally as needed for pain. Do not use for more than 1 week   PSYLLIUM (METAMUCIL SMOOTH TEXTURE) 28 % PACKET    Take 1 packet by mouth 2 (two) times daily.   I personally performed the services described in this documentation, which was scribed in my presence. The recorded information has been reviewed and is accurate.    Chesley Noon [place], [place] [id number]    Delora Fuel, MD  01/04/17 0736  

## 2017-02-08 ENCOUNTER — Encounter (HOSPITAL_COMMUNITY): Payer: Self-pay | Admitting: Emergency Medicine

## 2017-02-08 DIAGNOSIS — K626 Ulcer of anus and rectum: Secondary | ICD-10-CM | POA: Insufficient documentation

## 2017-02-08 DIAGNOSIS — Z79899 Other long term (current) drug therapy: Secondary | ICD-10-CM | POA: Insufficient documentation

## 2017-02-08 LAB — CBC
HEMATOCRIT: 35.3 % — AB (ref 39.0–52.0)
HEMOGLOBIN: 11.4 g/dL — AB (ref 13.0–17.0)
MCH: 26.9 pg (ref 26.0–34.0)
MCHC: 32.3 g/dL (ref 30.0–36.0)
MCV: 83.3 fL (ref 78.0–100.0)
Platelets: 216 10*3/uL (ref 150–400)
RBC: 4.24 MIL/uL (ref 4.22–5.81)
RDW: 13.6 % (ref 11.5–15.5)
WBC: 4.5 10*3/uL (ref 4.0–10.5)

## 2017-02-08 NOTE — ED Triage Notes (Signed)
Patient is complaining having hemorrhoids and bleeding from rectum. Patient states that he was here a few weeks back and the bleeding still has not stopped.

## 2017-02-09 ENCOUNTER — Emergency Department (HOSPITAL_COMMUNITY)
Admission: EM | Admit: 2017-02-09 | Discharge: 2017-02-09 | Disposition: A | Discharge status: Home / Self Care | Payer: Self-pay | Attending: Emergency Medicine | Admitting: Emergency Medicine

## 2017-02-09 ENCOUNTER — Emergency Department (HOSPITAL_COMMUNITY): Payer: Self-pay

## 2017-02-09 ENCOUNTER — Encounter (HOSPITAL_COMMUNITY): Payer: Self-pay | Admitting: Radiology

## 2017-02-09 DIAGNOSIS — K626 Ulcer of anus and rectum: Secondary | ICD-10-CM

## 2017-02-09 LAB — COMPREHENSIVE METABOLIC PANEL
ALBUMIN: 3.9 g/dL (ref 3.5–5.0)
ALT: 16 U/L — ABNORMAL LOW (ref 17–63)
ANION GAP: 6 (ref 5–15)
AST: 26 U/L (ref 15–41)
Alkaline Phosphatase: 55 U/L (ref 38–126)
BILIRUBIN TOTAL: 0.7 mg/dL (ref 0.3–1.2)
BUN: 10 mg/dL (ref 6–20)
CHLORIDE: 106 mmol/L (ref 101–111)
CO2: 25 mmol/L (ref 22–32)
Calcium: 9.1 mg/dL (ref 8.9–10.3)
Creatinine, Ser: 1.01 mg/dL (ref 0.61–1.24)
GFR calc Af Amer: >60 mL/min (ref >60)
GFR calc non Af Amer: >60 mL/min (ref >60)
GLUCOSE: 105 mg/dL — AB (ref 65–99)
POTASSIUM: 3.7 mmol/L (ref 3.5–5.1)
Sodium: 137 mmol/L (ref 135–145)
TOTAL PROTEIN: 8.9 g/dL — AB (ref 6.5–8.1)

## 2017-02-09 LAB — ABO/RH: ABO/RH(D): AB POS

## 2017-02-09 LAB — POC OCCULT BLOOD, ED: FECAL OCCULT BLD: POSITIVE — AB

## 2017-02-09 LAB — TYPE AND SCREEN
ABO/RH(D): AB POS
Antibody Screen: NEGATIVE

## 2017-02-09 MED ORDER — IOPAMIDOL (ISOVUE-300) INJECTION 61%
INTRAVENOUS | Status: AC
  Filled 2017-02-09: qty 100

## 2017-02-09 MED ORDER — DIBUCAINE 1 % RE OINT: 1.0000 "application " | TOPICAL_OINTMENT | Freq: Four times a day (QID) | RECTAL | 0 refills | Status: DC | PRN

## 2017-02-09 MED ORDER — IOPAMIDOL (ISOVUE-300) INJECTION 61%
100.0000 mL | Freq: Once | INTRAVENOUS | Status: AC | PRN
  Administered 2017-02-09: 100 mL via INTRAVENOUS

## 2017-02-09 MED ORDER — LIDOCAINE HCL 2 % EX GEL
1.0000 "application " | Freq: Once | CUTANEOUS | Status: AC
  Administered 2017-02-09: 1

## 2017-02-09 MED ORDER — POLYETHYLENE GLYCOL 3350 17 G PO PACK: 17.0000 g | PACK | Freq: Every day | ORAL | 0 refills | Status: DC

## 2017-02-09 MED ORDER — DOCUSATE SODIUM 100 MG PO CAPS: 100.0000 mg | ORAL_CAPSULE | Freq: Two times a day (BID) | ORAL | 0 refills | Status: DC

## 2017-02-09 NOTE — ED Notes (Signed)
No respiratory or acute distress noted alert and oriented x 3 call light in reach. 

## 2017-02-09 NOTE — ED Provider Notes (Signed)
[facility] DEPT Provider Note   CSN: [id number] Arrival date & time: 02/08/17  2315    By signing my name below, I, Macon Large, attest that this documentation has been prepared under the direction and in the presence of Merryl Hacker, MD. Electronically Signed: Macon Large, ED Scribe. 02/09/17. 1:20 AM.  History   Chief Complaint Chief Complaint  Patient presents with  . Rectal Bleeding   The history is provided by the patient. No language interpreter was used.   HPI Comments: Logan Ward is a 36 y.o. male who presents to the Emergency Department complaining of gradually worsening, moderate rectal pain onset ten days. Pt was last seen in the ED on 01/20 for similar symptoms. Per pt, he was diagnosed with external hemorrhoids. He reports associated blood in stool x 1 week. Pt notes he has been having normal stools with some straining. He states his pain is worsened with straining. He notes having gas followed by nausea when attempting to pass a bowel stool. He has taken anusol suppository and taken sitz baths with no relief. Pt also reports he has been waking up with some spotting in his underwear. He denies taking any daily medications or having sexual intercourse with the same sex. Pt denies abdominal pain, vomiting, fever, chills, and dizziness. Pt also denies seeing a GI doctor recently. Of note, patient reports that he has had some persistent symptoms since being seen in January. However, bleeding has become more significant.  History reviewed. No pertinent past medical history.  There are no active problems to display for this patient.   History reviewed. No pertinent surgical history.     Home Medications    Prior to Admission medications   Medication Sig Start Date End Date Taking? Authorizing Provider  cyclobenzaprine (FLEXERIL) 10 MG tablet Take 1 tablet (10 mg total) by mouth 3 (three) times daily as needed for muscle spasms. Patient not taking:  Reported on 01/04/2017 07/03/16   Dorie Rank, MD  dibucaine (NUPERCAINAL) 1 % OINT Place 1 application rectally 4 (four) times daily as needed for pain. 02/09/17   Merryl Hacker, MD  docusate sodium (COLACE) 100 MG capsule Take 1 capsule (100 mg total) by mouth every 12 (twelve) hours. 02/09/17   Merryl Hacker, MD  doxycycline (VIBRAMYCIN) 100 MG capsule Take 1 capsule (100 mg total) by mouth 2 (two) times daily. One po bid x 7 days Patient not taking: Reported on 07/03/2016 09/02/13   Julianne Rice, MD  HYDROcodone-acetaminophen (NORCO/VICODIN) 5-325 MG per tablet Take 1 tablet by mouth every 6 (six) hours as needed for moderate pain or severe pain. Patient not taking: Reported on 07/03/2016 08/25/14   Antonietta Breach, PA-C  hydrocortisone (ANUSOL-HC) 25 MG suppository Place 1 suppository (25 mg total) rectally 2 (two) times daily as needed for hemorrhoids or itching. For 7 days 12/30/16   Noland Fordyce, PA-C  naproxen (NAPROSYN) 500 MG tablet Take 1 tablet (500 mg total) by mouth 2 (two) times daily. Patient not taking: Reported on 01/04/2017 07/03/16   Dorie Rank, MD  ondansetron (ZOFRAN-ODT) 4 MG disintegrating tablet Take 1 tablet (4 mg total) by mouth every 6 (six) hours as needed for nausea. PRN for nausea or vomiting Patient not taking: Reported on 07/03/2016 08/29/14   Liam Graham, PA-C  polyethylene glycol [facility]) packet Take 17 g by mouth daily. 02/09/17   Merryl Hacker, MD  psyllium (METAMUCIL SMOOTH TEXTURE) 28 % packet Take 1 packet by mouth 2 (two) times  daily. 01/04/17   Nona Dell, PA-C    Family History Family History  Problem Relation Age of Onset  . Hypertension Father     Social History Social History  Substance Use Topics  . Smoking status: Never Smoker  . Smokeless tobacco: Never Used  . Alcohol use No     Allergies   Patient has no known allergies.   Review of Systems Review of Systems  Constitutional: Negative for chills and fever.    Gastrointestinal: Positive for blood in stool and nausea. Negative for abdominal pain and vomiting.  Neurological: Negative for dizziness.  All other systems reviewed and are negative.    Physical Exam Updated Vital Signs BP 116/68   Pulse 70   Temp 98.4 F (36.9 C) (Oral)   Resp 20   Ht 6\' 1"  (1.854 m)   Wt 248 lb (112.5 kg)   SpO2 100%   BMI 32.72 kg/m   Physical Exam  Constitutional: He is oriented to person, place, and time. He appears well-developed and well-nourished.  HENT:  Head: Normocephalic and atraumatic.  Cardiovascular: Normal rate and regular rhythm.   Pulmonary/Chest: Effort normal. No respiratory distress.  Abdominal: Soft. Bowel sounds are normal. There is no tenderness. There is no rebound.  Genitourinary:  Genitourinary Comments: No external hemorrhoids noted, mild tenderness to palpation over the posterior wall of the rectum, no fluctuance noted, mucousy stool noted, specks of blood, no gross blood  Musculoskeletal: He exhibits no edema.  Neurological: He is alert and oriented to person, place, and time.  Skin: Skin is warm and dry.  Psychiatric: He has a normal mood and affect.  Nursing note and vitals reviewed.    ED Treatments / Results   DIAGNOSTIC STUDIES: Oxygen Saturation is 99% on RA, normal by my interpretation.    COORDINATION OF CARE: 12:58 AM Discussed treatment plan with pt at bedside and pt agreed to plan.   Labs (all labs ordered are listed, but only abnormal results are displayed) Labs Reviewed  COMPREHENSIVE METABOLIC PANEL - Abnormal; Notable for the following:       Result Value   Glucose, Bld 105 (*)    Total Protein 8.9 (*)    ALT 16 (*)    All other components within normal limits  CBC - Abnormal; Notable for the following:    Hemoglobin 11.4 (*)    HCT 35.3 (*)    All other components within normal limits  POC OCCULT BLOOD, ED - Abnormal; Notable for the following:    Fecal Occult Bld POSITIVE (*)    All other  components within normal limits  TYPE AND SCREEN  ABO/RH    EKG  EKG Interpretation None       Radiology Ct Pelvis W Contrast  Result Date: 02/09/2017 CLINICAL DATA:  36 year old male with rectal pain. Evaluate for abscess. EXAM: CT PELVIS WITH CONTRAST TECHNIQUE: Multidetector CT imaging of the pelvis was performed using the standard protocol following the bolus administration of intravenous contrast. CONTRAST:  120mL ISOVUE-300 IOPAMIDOL (ISOVUE-300) INJECTION 61% COMPARISON:  None. FINDINGS: Urinary Tract: The visualized distal ureters and urinary bladder appear unremarkable. Bowel: There is a focal small outpouching from the left lateral rectal wall which may represent a focal ulcer or diverticula. There is no significant associated inflammatory changes. No evidence of perforation. No drainable fluid collection or abscess identified. No bowel dilatation or active inflammation identified within the pelvis. The visualized appendix appears unremarkable. Vascular/Lymphatic: The visualized vasculature appear unremarkable. Top-normal left perirectal  lymph nodes measure up to 13 mm, likely reactive. Reproductive: The prostate and seminal vesicles are grossly unremarkable. Other:  None Musculoskeletal: No suspicious bone lesions identified. IMPRESSION: 1. Small focal outpouching within the left rectal wall may represent a small ulcer. No associated inflammatory changes or evidence of perforation. No drainable fluid collection or abscess. 2. Multiple mildly enlarged perirectal lymph nodes, likely reactive. Electronically Signed   By: Anner Crete M.D.   On: 02/09/2017 03:20    Procedures Procedures (including critical care time)  Medications Ordered in ED Medications  iopamidol (ISOVUE-300) 61 % injection (not administered)  lidocaine (XYLOCAINE) 2 % jelly 1 application (1 application Other Given 02/09/17 0222)  iopamidol (ISOVUE-300) 61 % injection 100 mL (100 mLs Intravenous Contrast Given  02/09/17 0155)     Initial Impression / Assessment and Plan / ED Course  I have reviewed the triage vital signs and the nursing notes.  Pertinent labs & imaging results that were available during my care of the patient were reviewed by me and considered in my medical decision making (see chart for details).     Patient presents with persistent rectal pain and worsening rectal bleeding. Recent diagnosis of hemorrhoids in January but persistent and worsening symptoms. He is nontoxic. Vital signs are reassuring. No obvious hemorrhoids on exam. Tenderness over the posterior aspect of the rectal vault, no fluctuance or fissure noted, specks of blood but no gross blood. Mucousy stools noted. Hemoglobin is 11.4. Last hemoglobin 13 6 months ago. Given persistent symptoms, CT pelvis obtained to evaluate for deep abscess or other pathology. CT scan shows no evidence of abscess but does note likely rectal ulcer. Patient with no history of IBD. I have discussed with him continuing supportive measures including topical anesthetic and sitz baths. Avoid constipation. Follow-up with gastroenterology and general surgery provided. He was given return precautions regarding worsening bleeding.  After history, exam, and medical workup I feel the patient has been appropriately medically screened and is safe for discharge home. Pertinent diagnoses were discussed with the patient. Patient was given return precautions.  Final Clinical Impressions(s) / ED Diagnoses   Final diagnoses:  Rectal ulcer    New Prescriptions New Prescriptions   DIBUCAINE (NUPERCAINAL) 1 % OINT    Place 1 application rectally 4 (four) times daily as needed for pain.   DOCUSATE SODIUM (COLACE) 100 MG CAPSULE    Take 1 capsule (100 mg total) by mouth every 12 (twelve) hours.   POLYETHYLENE GLYCOL (MIRALAX) PACKET    Take 17 g by mouth daily.   I personally performed the services described in this documentation, which was scribed in my  presence. The recorded information has been reviewed and is accurate.     Merryl Hacker, MD 02/09/17 [phone]

## 2017-02-09 NOTE — ED Notes (Signed)
Bed: WA24 Expected date:  Expected time:  Means of arrival:  Comments: 

## 2017-02-09 NOTE — Discharge Instructions (Signed)
You were seen today for persistent rectal bleeding and pain. It appears to likely have an ulcer. There is no obvious abscess to drain. Continue supportive measures at home including sitz baths, making sure that you do not get constipated, pain medication topically. You need to follow-up with gastroenterology for colonoscopy. You may also need to follow-up with general surgery if symptoms persist. If you have worsening bleeding or any new worsening symptoms you should be reevaluated.

## 2017-02-09 NOTE — ED Notes (Signed)
No respiratory or acute distress noted alert and oriented x 3 no reaction to medication noted call light in reach. 

## 2017-03-12 ENCOUNTER — Encounter (HOSPITAL_COMMUNITY): Payer: Self-pay | Admitting: Emergency Medicine

## 2017-03-12 ENCOUNTER — Ambulatory Visit (HOSPITAL_COMMUNITY)
Admission: EM | Admit: 2017-03-12 | Discharge: 2017-03-12 | Disposition: A | Discharge status: Home / Self Care | Payer: Self-pay | Attending: Family Medicine | Admitting: Family Medicine

## 2017-03-12 DIAGNOSIS — R197 Diarrhea, unspecified: Secondary | ICD-10-CM

## 2017-03-12 DIAGNOSIS — R112 Nausea with vomiting, unspecified: Secondary | ICD-10-CM

## 2017-03-12 MED ORDER — ONDANSETRON 8 MG PO TBDP: 8.0000 mg | ORAL_TABLET | Freq: Three times a day (TID) | ORAL | 0 refills | Status: DC | PRN

## 2017-03-12 NOTE — Discharge Instructions (Signed)
Avoid dairy products for the next 24 hours

## 2017-03-12 NOTE — ED Triage Notes (Signed)
The patient presented to the [name] with a complaint of abdominal cramping with N/V/D that started today. The patient reported that he had a fever last night.

## 2017-03-12 NOTE — ED Provider Notes (Signed)
[facility]    CSN: [id number] Arrival date & time: 03/12/17  1528     History   Chief Complaint Chief Complaint  Patient presents with  . Abdominal Cramping    HPI Logan Ward is a 36 y.o. male.   The patient presented to the [facility] with a complaint of abdominal cramping with N/V/D that started yesterday. He vomited yesterday once and again this morning. He's had loose stool the entire time.. The patient reported that he had a fever last night.       History reviewed. No pertinent past medical history.  There are no active problems to display for this patient.   History reviewed. No pertinent surgical history.     Home Medications    Prior to Admission medications   Medication Sig Start Date End Date Taking? Authorizing Provider  ondansetron (ZOFRAN-ODT) 8 MG disintegrating tablet Take 1 tablet (8 mg total) by mouth every 8 (eight) hours as needed for nausea. 03/12/17   Robyn Haber, MD    Family History Family History  Problem Relation Age of Onset  . Hypertension Father     Social History Social History  Substance Use Topics  . Smoking status: Never Smoker  . Smokeless tobacco: Never Used  . Alcohol use No     Allergies   Patient has no known allergies.   Review of Systems Review of Systems  Constitutional: Positive for fatigue and fever.  HENT: Negative.   Respiratory: Negative.   Gastrointestinal: Positive for diarrhea, nausea and vomiting. Negative for abdominal pain.  Musculoskeletal: Negative.   Neurological: Negative.      Physical Exam Triage Vital Signs ED Triage Vitals  Enc Vitals Group     BP 03/12/17 1602 123/71     Pulse Rate 03/12/17 1602 75     Resp 03/12/17 1602 18     Temp 03/12/17 1602 98.8 F (37.1 C)     Temp Source 03/12/17 1602 Oral     SpO2 03/12/17 1602 99 %     Weight --      Height --      Head Circumference --      Peak Flow --      Pain Score 03/12/17 1600 8     Pain Loc --      Pain  Edu? --      Excl. in [place]? --    No data found.   Updated Vital Signs BP 123/71 (BP Location: Right Arm)   Pulse 75   Temp 98.8 F (37.1 C) (Oral)   Resp 18   SpO2 99%    Physical Exam  Constitutional: He is oriented to person, place, and time. He appears well-developed and well-nourished.  HENT:  Right Ear: External ear normal.  Left Ear: External ear normal.  Mouth/Throat: Oropharynx is clear and moist.  Eyes: Conjunctivae and EOM are normal. Pupils are equal, round, and reactive to light.  Neck: Normal range of motion. Neck supple.  Cardiovascular: Normal rate, regular rhythm and normal heart sounds.   Pulmonary/Chest: Effort normal and breath sounds normal.  Abdominal: Soft. There is no tenderness. There is no rebound and no guarding.  Hyperactive bowel sounds  Musculoskeletal: Normal range of motion.  Neurological: He is alert and oriented to person, place, and time.  Skin: Skin is warm and dry.  Nursing note and vitals reviewed.    UC Treatments / Results  Labs (all labs ordered are listed, but only abnormal results are displayed) Labs  Reviewed - No data to display  EKG  EKG Interpretation None       Radiology No results found.  Procedures Procedures (including critical care time)  Medications Ordered in UC Medications - No data to display   Initial Impression / Assessment and Plan / UC Course  I have reviewed the triage vital signs and the nursing notes.  Pertinent labs & imaging results that were available during my care of the patient were reviewed by me and considered in my medical decision making (see chart for details).     Final Clinical Impressions(s) / UC Diagnoses   Final diagnoses:  Nausea vomiting and diarrhea    New Prescriptions New Prescriptions   ONDANSETRON (ZOFRAN-ODT) 8 MG DISINTEGRATING TABLET    Take 1 tablet (8 mg total) by mouth every 8 (eight) hours as needed for nausea.     Robyn Haber, MD 03/12/17 [phone]

## 2017-06-16 ENCOUNTER — Encounter: Payer: Self-pay | Admitting: Gastroenterology

## 2017-08-05 ENCOUNTER — Other Ambulatory Visit (INDEPENDENT_AMBULATORY_CARE_PROVIDER_SITE_OTHER): Payer: PRIVATE HEALTH INSURANCE

## 2017-08-05 ENCOUNTER — Ambulatory Visit (INDEPENDENT_AMBULATORY_CARE_PROVIDER_SITE_OTHER): Payer: PRIVATE HEALTH INSURANCE | Admitting: Gastroenterology

## 2017-08-05 ENCOUNTER — Encounter (INDEPENDENT_AMBULATORY_CARE_PROVIDER_SITE_OTHER): Payer: Self-pay

## 2017-08-05 ENCOUNTER — Encounter: Payer: Self-pay | Admitting: Gastroenterology

## 2017-08-05 VITALS — BP 122/52 | HR 72 | Ht 72.0 in | Wt 240.0 lb

## 2017-08-05 DIAGNOSIS — K602 Anal fissure, unspecified: Secondary | ICD-10-CM | POA: Diagnosis not present

## 2017-08-05 DIAGNOSIS — D649 Anemia, unspecified: Secondary | ICD-10-CM

## 2017-08-05 DIAGNOSIS — K649 Unspecified hemorrhoids: Secondary | ICD-10-CM

## 2017-08-05 DIAGNOSIS — K625 Hemorrhage of anus and rectum: Secondary | ICD-10-CM | POA: Diagnosis not present

## 2017-08-05 LAB — CBC WITH DIFFERENTIAL/PLATELET
BASOS ABS: 0 10*3/uL (ref 0.0–0.1)
Basophils Relative: 0.5 % (ref 0.0–3.0)
EOS PCT: 0.4 % (ref 0.0–5.0)
Eosinophils Absolute: 0 10*3/uL (ref 0.0–0.7)
HCT: 39.4 % (ref 39.0–52.0)
HEMOGLOBIN: 12.6 g/dL — AB (ref 13.0–17.0)
LYMPHS ABS: 1.4 10*3/uL (ref 0.7–4.0)
LYMPHS PCT: 34.2 % (ref 12.0–46.0)
MCHC: 32.1 g/dL (ref 30.0–36.0)
MCV: 86.3 fl (ref 78.0–100.0)
MONOS PCT: 8.7 % (ref 3.0–12.0)
Monocytes Absolute: 0.4 10*3/uL (ref 0.1–1.0)
NEUTROS PCT: 56.2 % (ref 43.0–77.0)
Neutro Abs: 2.3 10*3/uL (ref 1.4–7.7)
Platelets: 211 10*3/uL (ref 150.0–400.0)
RBC: 4.56 Mil/uL (ref 4.22–5.81)
RDW: 13.9 % (ref 11.5–15.5)
WBC: 4.1 10*3/uL (ref 4.0–10.5)

## 2017-08-05 LAB — FERRITIN: Ferritin: 123.4 ng/mL (ref 22.0–322.0)

## 2017-08-05 MED ORDER — AMBULATORY NON FORMULARY MEDICATION: 1 refills | Status: DC

## 2017-08-05 MED ORDER — NA SULFATE-K SULFATE-MG SULF 17.5-3.13-1.6 GM/177ML PO SOLN: 1.0000 | Freq: Once | ORAL | 0 refills | Status: AC

## 2017-08-05 NOTE — Progress Notes (Signed)
HPI :  36 y/o male here for a new patient visit, self referral, for what he suspect is hemorrhoids.   He endorses red blood in the stools, and toilet, with bowel movements, ongoing for several months. This occurs with every bowel movement. He is having 2-3 BMs per day. He is constipated, having some straining. He is using suppositories PRN.  He has some having some pain when he has a bowel movement but then resolves after he is done.  No abdominal pains. He has had some leakage of blood into his underwear as well. Hgb Feb 24th 11.4, MCV 83.   No prior colonoscopy. No FH of colon cancer or Crohn's disease.   He has lost some weight, about 30 lbs - most of this occurred about 4-6 months ago, and then no further weight loss.  He is eating well. He thinks he is eating slightly less. He thinks perhaps his appetite is down a bit. Initially weight came off quickly, but has been stable over the past 1-2 months.   CT pelvis 02/09/17 - small outpouching of the left rectal wall, ? ulceration?, mildly enlarged lymph nodes.   Past Medical History:  Diagnosis Date  . Hemorrhoids   . Obesity      History reviewed. No pertinent surgical history.   Family History  Problem Relation Age of Onset  . Hypertension Father   . Throat cancer Father   . Heart attack Brother   . Colon cancer Neg Hx   . Esophageal cancer Neg Hx   . Stomach cancer Neg Hx    Social History  Substance Use Topics  . Smoking status: Never Smoker  . Smokeless tobacco: Never Used  . Alcohol use No   No current outpatient prescriptions on file.   No current facility-administered medications for this visit.    No Known Allergies   Review of Systems: All systems reviewed and negative except where noted in HPI.    No results found.  Physical Exam: BP (!) 122/52   Pulse 72   Ht 6' (1.829 m)   Wt 240 lb (108.9 kg)   BMI 32.55 kg/m  Constitutional: Pleasant,well-developed, male in no acute distress. HEENT:  Normocephalic and atraumatic. Conjunctivae are normal. No scleral icterus. Neck supple.  Cardiovascular: Normal rate, regular rhythm.  Pulmonary/chest: Effort normal and breath sounds normal. No wheezing, rales or rhonchi. Abdominal: Soft, nondistended, nontender. Bowel sounds active throughout. There are no masses palpable. No hepatomegaly DRE / Anoscopy - posterior midline anal fissure, internal hemorrhoids - inflamed / friable Extremities: no edema Lymphadenopathy: No cervical adenopathy noted. Neurological: Alert and oriented to person place and time. Skin: Skin is warm and dry. No rashes noted. Psychiatric: Normal mood and affect. Behavior is normal.   ASSESSMENT AND PLAN: 36 year old male here for new patient visit for several months worth of rectal bleeding associated mild anemia with hemoglobin of 11.4 noted in February.  His rectal exam shows a posterior midline anal fissure and inflamed internal hemorrhoids, suspect these are the cause of his bleeding symptoms. However given his reported weight loss and anemia I'm recommending a colonoscopy to ensure no polyp/mass lesion. I discussed colonoscopy with him, risks and benefits, he wished to proceed. This is scheduled tentatively for next week. In the interim we'll repeat his CBC with iron studies to ensure his anemia hasn't worsened in the interim. I'm recommending a daily fiber supplement and he should minimize straining and time spent on the toilet all possible. We will treat  the fissure with topical nitroglycerin ointment 0.125% 3 times a day. Ultimately if his hemorrhoids are thought to be the cause of his bleeding will recommend hemorrhoid banding, however will hold off on that today given the fissure noted on exam and potential for worsening pain.   He and his brother who is present with him during the visit today were in agreement with plan and verbalized understanding.   Cellar, MD [facility] Pager  [phone]

## 2017-08-05 NOTE — Patient Instructions (Addendum)
If you are age 34 or older, your body mass index should be between 23-30. Your Body mass index is 32.55 kg/m. If this is out of the aforementioned range listed, please consider follow up with your Primary Care Provider.  If you are age 95 or younger, your body mass index should be between 19-25. Your Body mass index is 32.55 kg/m. If this is out of the aformentioned range listed, please consider follow up with your Primary Care Provider.   You have been scheduled for a colonoscopy. Please follow written instructions given to you at your visit today.  Please pick up your prep supplies at the pharmacy within the next 1-3 days. If you use inhalers (even only as needed), please bring them with you on the day of your procedure. Your physician has requested that you go to www.startemmi.com and enter the access code given to you at your visit today. This web site gives a general overview about your procedure. However, you should still follow specific instructions given to you by our office regarding your preparation for the procedure.  Your physician has requested that you go to the basement for the following lab work before leaving today:  CBC, TIBD, Ferritin  We have sent the following medications to your pharmacy for you to pick up at your convenience:  Suprep  We have sent a prescription for nitroglycerin 0.125% gel to [facility]. You should apply a pea size amount to your rectum three times daily x 6-8 weeks.  [facility]'s information is below: Address: [address], [place], [place] [address]  [phone]  You have been given a handout on daily fiber supplements.

## 2017-08-06 LAB — IRON AND TIBC
%SAT: 19 % (ref 15–60)
IRON: 62 ug/dL (ref 50–180)
TIBC: 321 ug/dL (ref 250–425)
UIBC: 259 ug/dL

## 2017-08-13 ENCOUNTER — Ambulatory Visit (AMBULATORY_SURGERY_CENTER): Payer: PRIVATE HEALTH INSURANCE | Admitting: Gastroenterology

## 2017-08-13 ENCOUNTER — Encounter: Payer: Self-pay | Admitting: Gastroenterology

## 2017-08-13 VITALS — BP 106/50 | HR 52 | Temp 97.5°F | Resp 16 | Ht 72.0 in | Wt 240.0 lb

## 2017-08-13 DIAGNOSIS — K602 Anal fissure, unspecified: Secondary | ICD-10-CM | POA: Diagnosis not present

## 2017-08-13 DIAGNOSIS — K625 Hemorrhage of anus and rectum: Secondary | ICD-10-CM | POA: Diagnosis present

## 2017-08-13 DIAGNOSIS — K6289 Other specified diseases of anus and rectum: Secondary | ICD-10-CM | POA: Diagnosis not present

## 2017-08-13 MED ORDER — SODIUM CHLORIDE 0.9 % IV SOLN: 500.0000 mL | INTRAVENOUS | Status: DC

## 2017-08-13 NOTE — Progress Notes (Signed)
Called to room to assist during endoscopic procedure.  Patient ID and intended procedure confirmed with present staff. Received instructions for my participation in the procedure from the performing physician.  

## 2017-08-13 NOTE — Op Note (Signed)
[facility] Patient Name: Tasha Jindra Procedure Date: 08/13/2017 7:22 AM MRN: [id number] Endoscopist: Remo Lipps P. Shonda Mandarino MD, MD Age: 36 Referring MD:  Date of Birth: 04-Aug-1981 Gender: Male Account #: [id number] Procedure:                Colonoscopy Indications:              Hematochezia, patient reports improved symptoms                            with topical nitroglycerin Medicines:                Monitored Anesthesia Care Procedure:                Pre-Anesthesia Assessment:                           - Prior to the procedure, a History and Physical                            was performed, and patient medications and                            allergies were reviewed. The patient's tolerance of                            previous anesthesia was also reviewed. The risks                            and benefits of the procedure and the sedation                            options and risks were discussed with the patient.                            All questions were answered, and informed consent                            was obtained. Prior Anticoagulants: The patient has                            taken no previous anticoagulant or antiplatelet                            agents. ASA Grade Assessment: II - A patient with                            mild systemic disease. After reviewing the risks                            and benefits, the patient was deemed in                            satisfactory condition to undergo the procedure.  After obtaining informed consent, the colonoscope                            was passed under direct vision. Throughout the                            procedure, the patient's blood pressure, pulse, and                            oxygen saturations were monitored continuously. The                            Colonoscope was introduced through the anus with                            the intention of advancing to  the cecum. The scope                            was advanced to the sigmoid colon before the                            procedure was aborted. Medications were given. The                            colonoscopy was performed without difficulty. The                            patient tolerated the procedure well. The quality                            of the bowel preparation was inadequate. The rectum                            was photographed. Scope In: 1:76:16 AM Scope Out: 7:45:09 AM Total Procedure Duration: 0 hours 5 minutes 51 seconds  Findings:                 The perianal exam findings include posterior                            midline anal fissure.                           A large amount of solid stool was found in the                            recto-sigmoid colon and in the sigmoid colon,                            precluding visualization. The colonoscope could not                            evaluate beyond the rectosigmoid colon due to  residual stool.                           A patchy areas of ulcerated mucosa was found in the                            rectum. Biopsies were taken with a cold forceps for                            histology.                           Internal hemorrhoids were found during retroflexion. Complications:            No immediate complications. Estimated blood loss:                            Minimal. Estimated Blood Loss:     Estimated blood loss was minimal. Impression:               - Preparation of the colon was inadequate,                            procedure aborted due to poor prep.                           - Anal fissure found on perianal exam.                           - Ulcerated mucosa in the rectum. Biopsied.                           - Internal hemorrhoids.                           Unclear etiology of rectal ulcerations -                            differential includes Crohns's versus infectious                             versus NSAID related. Will await biopsies Recommendation:           - Patient has a contact number available for                            emergencies. The signs and symptoms of potential                            delayed complications were discussed with the                            patient. Return to normal activities tomorrow.                            Written discharge instructions were provided to the  patient.                           - Resume previous diet.                           - Continue present medications.                           - Await pathology results.                           - Repeat colonoscopy will be recommended because                            the bowel preparation was suboptimal. Remo Lipps P. Breann Losano MD, MD 08/13/2017 7:52:19 AM This report has been signed electronically.

## 2017-08-13 NOTE — Progress Notes (Signed)
Pt. Kept in recovery for extended period of time because he wanted to speak with Dr. Havery Moros. He had to wait for Dr. Havery Moros to finish procedure.  Discharged after speaking with Dr. Havery Moros.

## 2017-08-13 NOTE — Patient Instructions (Signed)
YOU HAD AN ENDOSCOPIC PROCEDURE TODAY AT [facility]:   Refer to the procedure report that was given to you for any specific questions about what was found during the examination.  If the procedure report does not answer your questions, please call your gastroenterologist to clarify.  If you requested that your care partner not be given the details of your procedure findings, then the procedure report has been included in a sealed envelope for you to review at your convenience later.  YOU SHOULD EXPECT: Some feelings of bloating in the abdomen. Passage of more gas than usual.  Walking can help get rid of the air that was put into your GI tract during the procedure and reduce the bloating. If you had a lower endoscopy (such as a colonoscopy or flexible sigmoidoscopy) you may notice spotting of blood in your stool or on the toilet paper. If you underwent a bowel prep for your procedure, you may not have a normal bowel movement for a few days.  Please Note:  You might notice some irritation and congestion in your nose or some drainage.  This is from the oxygen used during your procedure.  There is no need for concern and it should clear up in a day or so.  SYMPTOMS TO REPORT IMMEDIATELY:   Following lower endoscopy (colonoscopy or flexible sigmoidoscopy):  Excessive amounts of blood in the stool  Significant tenderness or worsening of abdominal pains  Swelling of the abdomen that is new, acute  Fever of 100F or higher   For urgent or emergent issues, a gastroenterologist can be reached at any hour by calling [phone].   DIET:  We do recommend a small meal at first, but then you may proceed to your regular diet.  Drink plenty of fluids but you should avoid alcoholic beverages for 24 hours.  ACTIVITY:  You should plan to take it easy for the rest of today and you should NOT DRIVE or use heavy machinery until tomorrow (because of the sedation medicines used during the test).     FOLLOW UP: Our staff will call the number listed on your records the next business day following your procedure to check on you and address any questions or concerns that you may have regarding the information given to you following your procedure. If we do not reach you, we will leave a message.  However, if you are feeling well and you are not experiencing any problems, there is no need to return our call.  We will assume that you have returned to your regular daily activities without incident.  If any biopsies were taken you will be contacted by phone or by letter within the next 1-3 weeks.  Please call us at [phone] if you have not heard about the biopsies in 3 weeks.    SIGNATURES/CONFIDENTIALITY: You and/or your care partner have signed paperwork which will be entered into your electronic medical record.  These signatures attest to the fact that that the information above on your After Visit Summary has been reviewed and is understood.  Full responsibility of the confidentiality of this discharge information lies with you and/or your care-partner.  Hemorrhoid information given.  Repeat colonoscopy is recommended due to poor prep.  Dr. Havery Moros would like to review pathology results before scheduling repeat colonoscopy.

## 2017-08-14 ENCOUNTER — Telehealth: Payer: Self-pay

## 2017-08-14 NOTE — Telephone Encounter (Signed)
  Follow up Call-  Call back number 08/13/2017  Post procedure Call Back phone  # [phone]  Permission to leave phone message Yes  Some recent data might be hidden     Patient questions:  Do you have a fever, pain , or abdominal swelling? No. Pain Score  0 *  Have you tolerated food without any problems? Yes.    Have you been able to return to your normal activities? Yes.    Do you have any questions about your discharge instructions: Diet   No. Medications  No. Follow up visit  No.  Do you have questions or concerns about your Care? No.  Actions: * If pain score is 4 or above: No action needed, pain <4.  No problems noted per pt. maw

## 2017-10-30 ENCOUNTER — Encounter: Payer: Self-pay | Admitting: *Deleted

## 2017-11-13 ENCOUNTER — Encounter (HOSPITAL_COMMUNITY): Payer: Self-pay | Admitting: Emergency Medicine

## 2017-11-13 ENCOUNTER — Ambulatory Visit (HOSPITAL_COMMUNITY)
Admission: EM | Admit: 2017-11-13 | Discharge: 2017-11-13 | Disposition: A | Discharge status: Home / Self Care | Payer: Self-pay | Attending: Emergency Medicine | Admitting: Emergency Medicine

## 2017-11-13 ENCOUNTER — Other Ambulatory Visit: Payer: Self-pay

## 2017-11-13 ENCOUNTER — Ambulatory Visit: Payer: PRIVATE HEALTH INSURANCE | Admitting: Gastroenterology

## 2017-11-13 DIAGNOSIS — R197 Diarrhea, unspecified: Secondary | ICD-10-CM

## 2017-11-13 NOTE — Discharge Instructions (Signed)
Push fluid intake.  Bland diet. May try antidiarrheal if symptoms persist in the upcoming few days. Return to be seen if diarrhea persists up to or greater than 10 days or follow up with your primary care provider. Return to be seen if develop increased abdominal pain, fevers, blood to stool, weakness or dizziness.

## 2017-11-13 NOTE — ED Provider Notes (Signed)
[facility]    CSN: [id number] Arrival date & time: 11/13/17  1436     History   Chief Complaint Chief Complaint  Patient presents with  . Abdominal Pain    HPI Logan Ward is a 36 y.o. male.   Logan Ward presents with family with concerns of frequent diarrhea. This started 11/27 originally with headache and some cold symptoms, these have since resolved. Yesterday had approximately 10 loose stools. Today has had 4 already. He feels that anything he eats or drinks comes back out. Without fevers, abdominal pain, nausea, weakness, of vomiting. Denies blood to stool. States stool is yellow colored. Without recent travel and is not on any antibiotics. Has seen gi in the past, states has a follow up appointment on 12/12. States has been urinating and has not noted increased concentration of urine. Denies any pain currently. He does experience mild cramping prior to having stool. 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 Has been drinking water and gatorade.    ROS per HPI.       Past Medical History:  Diagnosis Date  . Anal fissure   . Internal hemorrhoids   . Obesity   . Rectal ulcer     There are no active problems to display for this patient.   History reviewed. No pertinent surgical history.     Home Medications    Prior to Admission medications   Medication Sig Start Date End Date Taking? Authorizing Provider  AMBULATORY NON FORMULARY MEDICATION Medication Name: Nitroglycerine Ointment. 0.125% Apply rectally tid 08/05/17   Armbruster, Carlota Raspberry, MD    Family History Family History  Problem Relation Age of Onset  . Hypertension Father   . Throat cancer Father   . Heart attack Brother   . Colon cancer Neg Hx   . Esophageal cancer Neg Hx   . Stomach cancer Neg Hx     Social History Social History   Tobacco Use  . Smoking status: Never Smoker  . Smokeless tobacco: Never Used    Substance Use Topics  . Alcohol use: No  . Drug use: No     Allergies   Patient has no known allergies.   Review of Systems Review of Systems   Physical Exam Triage Vital Signs ED Triage Vitals  Enc Vitals Group     BP 11/13/17 1448 105/70     Pulse Rate 11/13/17 1448 (!) 111     Resp 11/13/17 1448 18     Temp 11/13/17 1448 98.3 F (36.8 C)     Temp Source 11/13/17 1448 Oral     SpO2 11/13/17 1448 97 %     Weight --      Height --      Head Circumference --      Peak Flow --      Pain Score 11/13/17 1447 3     Pain Loc --      Pain Edu? --      Excl. in [place]? --    No data found.  Updated Vital Signs BP 105/70 (BP Location: Right Arm)   Pulse 87   Temp 98.3 F (36.8 C) (Oral)   Resp 18   SpO2 97%   Visual Acuity Right Eye Distance:   Left Eye Distance:   Bilateral Distance:    Right Eye Near:   Left Eye Near:    Bilateral Near:     Physical  Exam  Constitutional: He is oriented to person, place, and time. He appears well-developed and well-nourished.  Cardiovascular: Regular rhythm. Tachycardia present.  Pulmonary/Chest: Effort normal and breath sounds normal.  Abdominal: Soft. He exhibits no distension. There is no tenderness. There is no rigidity, no rebound, no guarding, no CVA tenderness, no tenderness at McBurney's point and negative Murphy's sign.  Neurological: He is alert and oriented to person, place, and time.  Skin: Skin is warm and dry.     UC Treatments / Results  Labs (all labs ordered are listed, but only abnormal results are displayed) Labs Reviewed - No data to display  EKG  EKG Interpretation None       Radiology No results found.  Procedures Procedures (including critical care time)  Medications Ordered in UC Medications - No data to display   Initial Impression / Assessment and Plan / UC Course  I have reviewed the triage vital signs and the nursing notes.  Pertinent labs & imaging results that were available  during my care of the patient were reviewed by me and considered in my medical decision making (see chart for details).  Clinical Course as of Nov 13 1544  Thu Nov 13, 2017  1509 Patient provided glass of water to increase hydration, will recheck hr  [NB]    Clinical Course User Index [NB] Augusto Gamble B, NP    HR improved with water intake in clinic, patient tolerated. Symptoms consistent with likely viral diarrheal illness. Bland diet, push fluids. Return precautions provided. If develop pain, blood to stool, signs of dehydration, fevers, return to be seen. Keep follow up appointment with GI as already scheduled. If symptomsdo not improve in the next week to return to be seen or to follow up with PCP. Patient verbalized understanding and agreeable to plan.    Final Clinical Impressions(s) / UC Diagnoses   Final diagnoses:  Diarrhea of presumed infectious origin    ED Discharge Orders    None       Controlled Substance Prescriptions [place] Controlled Substance Registry consulted? Not Applicable   Zigmund Gottron, NP 11/13/17 1547    Zigmund Gottron, NP 11/13/17 1549

## 2017-11-13 NOTE — ED Triage Notes (Signed)
Onset Tuesday of slight headache.  On Wednesday started with diarrhea.  4 episodes of diarrhea today.  No vomiting.

## 2017-12-02 ENCOUNTER — Encounter (HOSPITAL_COMMUNITY): Payer: Self-pay | Admitting: Family Medicine

## 2017-12-02 DIAGNOSIS — R112 Nausea with vomiting, unspecified: Secondary | ICD-10-CM | POA: Insufficient documentation

## 2017-12-02 DIAGNOSIS — R1084 Generalized abdominal pain: Secondary | ICD-10-CM | POA: Insufficient documentation

## 2017-12-02 DIAGNOSIS — R197 Diarrhea, unspecified: Secondary | ICD-10-CM | POA: Insufficient documentation

## 2017-12-02 NOTE — ED Triage Notes (Addendum)
Patient reports he is experiencing mid abd pain that started all of a sudden while in the shower. Patient reports he got out of shower, had a small bowel movement, and vomited. Denies any fever. This occurred about 2 hours ago. Also, states he "passed out" while in the bath room. Patient is alert, oriented x 4, and appears in no acute distress. Patient is complaining of the wait time.

## 2017-12-03 ENCOUNTER — Emergency Department (HOSPITAL_COMMUNITY)
Admission: EM | Admit: 2017-12-03 | Discharge: 2017-12-03 | Disposition: A | Discharge status: Home / Self Care | Payer: Self-pay | Attending: Emergency Medicine | Admitting: Emergency Medicine

## 2017-12-03 DIAGNOSIS — R197 Diarrhea, unspecified: Secondary | ICD-10-CM

## 2017-12-03 DIAGNOSIS — R112 Nausea with vomiting, unspecified: Secondary | ICD-10-CM

## 2017-12-03 LAB — COMPREHENSIVE METABOLIC PANEL
ALBUMIN: 3.5 g/dL (ref 3.5–5.0)
ALT: 47 U/L (ref 17–63)
ANION GAP: 6 (ref 5–15)
AST: 62 U/L — AB (ref 15–41)
Alkaline Phosphatase: 69 U/L (ref 38–126)
BILIRUBIN TOTAL: 1.6 mg/dL — AB (ref 0.3–1.2)
BUN: 11 mg/dL (ref 6–20)
CHLORIDE: 105 mmol/L (ref 101–111)
CO2: 27 mmol/L (ref 22–32)
Calcium: 8.5 mg/dL — ABNORMAL LOW (ref 8.9–10.3)
Creatinine, Ser: 0.86 mg/dL (ref 0.61–1.24)
GFR calc Af Amer: >60 mL/min (ref >60)
GLUCOSE: 95 mg/dL (ref 65–99)
POTASSIUM: 3.5 mmol/L (ref 3.5–5.1)
Sodium: 138 mmol/L (ref 135–145)
TOTAL PROTEIN: 7.7 g/dL (ref 6.5–8.1)

## 2017-12-03 LAB — CBC WITH DIFFERENTIAL/PLATELET
BASOS ABS: 0 10*3/uL (ref 0.0–0.1)
BASOS PCT: 1 %
EOS ABS: 0 10*3/uL (ref 0.0–0.7)
EOS PCT: 1 %
HEMATOCRIT: 34 % — AB (ref 39.0–52.0)
Hemoglobin: 10.9 g/dL — ABNORMAL LOW (ref 13.0–17.0)
Lymphocytes Relative: 39 %
Lymphs Abs: 1.6 10*3/uL (ref 0.7–4.0)
MCH: 27.7 pg (ref 26.0–34.0)
MCHC: 32.1 g/dL (ref 30.0–36.0)
MCV: 86.5 fL (ref 78.0–100.0)
MONO ABS: 0.5 10*3/uL (ref 0.1–1.0)
MONOS PCT: 12 %
Neutro Abs: 1.9 10*3/uL (ref 1.7–7.7)
Neutrophils Relative %: 47 %
PLATELETS: 225 10*3/uL (ref 150–400)
RBC: 3.93 MIL/uL — ABNORMAL LOW (ref 4.22–5.81)
RDW: 14.8 % (ref 11.5–15.5)
WBC: 4 10*3/uL (ref 4.0–10.5)

## 2017-12-03 LAB — URINALYSIS, ROUTINE W REFLEX MICROSCOPIC
BILIRUBIN URINE: NEGATIVE
Bacteria, UA: NONE SEEN
GLUCOSE, UA: NEGATIVE mg/dL
KETONES UR: NEGATIVE mg/dL
LEUKOCYTES UA: NEGATIVE
Nitrite: NEGATIVE
PH: 5 (ref 5.0–8.0)
PROTEIN: NEGATIVE mg/dL
Specific Gravity, Urine: 1.023 (ref 1.005–1.030)

## 2017-12-03 LAB — LIPASE, BLOOD: LIPASE: 36 U/L (ref 11–51)

## 2017-12-03 MED ORDER — SODIUM CHLORIDE 0.9 % IV BOLUS (SEPSIS)
1000.0000 mL | Freq: Once | INTRAVENOUS | Status: AC
  Administered 2017-12-03: 1000 mL via INTRAVENOUS

## 2017-12-03 MED ORDER — LOPERAMIDE HCL 2 MG PO CAPS: 2.0000 mg | ORAL_CAPSULE | Freq: Four times a day (QID) | ORAL | 0 refills | Status: DC | PRN

## 2017-12-03 MED ORDER — DICYCLOMINE HCL 20 MG PO TABS: 20.0000 mg | ORAL_TABLET | Freq: Three times a day (TID) | ORAL | 0 refills | Status: DC

## 2017-12-03 MED ORDER — DICYCLOMINE HCL 10 MG/ML IM SOLN
20.0000 mg | Freq: Once | INTRAMUSCULAR | Status: AC
  Administered 2017-12-03: 20 mg via INTRAMUSCULAR
  Filled 2017-12-03 (×2): qty 2

## 2017-12-03 MED ORDER — PROMETHAZINE HCL 25 MG PO TABS: 25.0000 mg | ORAL_TABLET | Freq: Four times a day (QID) | ORAL | 0 refills | Status: DC | PRN

## 2017-12-03 MED ORDER — LOPERAMIDE HCL 2 MG PO CAPS
2.0000 mg | ORAL_CAPSULE | Freq: Once | ORAL | Status: AC
  Administered 2017-12-03: 2 mg via ORAL
  Filled 2017-12-03: qty 1

## 2017-12-03 MED ORDER — ONDANSETRON HCL 4 MG/2ML IJ SOLN
4.0000 mg | Freq: Once | INTRAMUSCULAR | Status: AC
  Administered 2017-12-03: 4 mg via INTRAVENOUS
  Filled 2017-12-03: qty 2

## 2017-12-03 NOTE — Discharge Instructions (Signed)
You may alternate Tylenol 1000 mg every 6 hours as needed for pain and Ibuprofen 800 mg every 8 hours as needed for pain.  Please take Ibuprofen with food. ° ° ° °To find a primary care or specialty doctor please call [phone] or [phone] to access "[facility] Find a Doctor Service." ° °You may also go on the [facility] website at [email or web address] ° °There are also multiple [facility], [facility] and [facility] practices throughout the [place] that are frequently accepting new patients. You may find a clinic that is close to your home and contact them. ° °[facility] -  °[address] °[place] [address] °[phone] ° ° °[facility] -  °[address] ° [place] [address] °[phone] ° ° °[facility] - °[address]  °[place] [address] °[phone] ° ° °

## 2017-12-03 NOTE — ED Provider Notes (Signed)
TIME SEEN: 2:32 AM  CHIEF COMPLAINT: Abdominal pain, nausea and vomiting, diarrhea  HPI: Patient is a 36 year old male with no significant past medical history who presents emergency department with diffuse crampy abdominal pain, nausea, vomiting and diarrhea.  Diarrhea present for several days.  Nausea, vomiting and crampy abdominal pain started tonight.  States that after vomiting multiple times he got up from the toilet had a syncopal event.  Denies chest pain or shortness of breath.  Reports abdominal pain is improving.  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  No recent C. difficile in his facility.  He denies bloody stool or melena, dysuria or hematuria.  No recent travel.  No history of abdominal surgeries.  No fever.  ROS: See HPI Constitutional: no fever  Eyes: no drainage  ENT: no runny nose   Cardiovascular:  no chest pain  Resp: no SOB  GI: Diarrhea and vomiting GU: no dysuria Integumentary: no rash  Allergy: no hives  Musculoskeletal: no leg swelling  Neurological: no slurred speech ROS otherwise negative  PAST MEDICAL HISTORY/PAST SURGICAL HISTORY:  Past Medical History:  Diagnosis Date  . Anal fissure   . Internal hemorrhoids   . Obesity   . Rectal ulcer     MEDICATIONS:  Prior to Admission medications   Medication Sig Start Date End Date Taking? Authorizing Provider  AMBULATORY NON FORMULARY MEDICATION Medication Name: Nitroglycerine Ointment. 0.125% Apply rectally tid 08/05/17   Armbruster, Carlota Raspberry, MD    ALLERGIES:  No Known Allergies  SOCIAL HISTORY:  Social History   Tobacco Use  . Smoking status: Never Smoker  . Smokeless tobacco: Never Used  Substance Use Topics  . Alcohol use: No    FAMILY HISTORY: Family History  Problem Relation Age of Onset  . Hypertension Father   . Throat cancer Father   . Heart attack Brother   . Colon cancer Neg Hx   . Esophageal cancer Neg Hx   . Stomach cancer  Neg Hx     EXAM: BP 121/71 (BP Location: Right Arm)   Pulse (!) 55   Temp 99.1 F (37.3 C) (Oral)   Resp 16   Ht 6\' 1"  (1.854 m)   Wt 106.6 kg (235 lb)   SpO2 100%   BMI 31.00 kg/m  CONSTITUTIONAL: Alert and oriented and responds appropriately to questions. Well-appearing; well-nourished HEAD: Normocephalic EYES: Conjunctivae clear, pupils appear equal, EOMI ENT: normal nose; moist mucous membranes NECK: Supple, no meningismus, no nuchal rigidity, no LAD  CARD: RRR; S1 and S2 appreciated; no murmurs, no clicks, no rubs, no gallops RESP: Normal chest excursion without splinting or tachypnea; breath sounds clear and equal bilaterally; no wheezes, no rhonchi, no rales, no hypoxia or respiratory distress, speaking full sentences ABD/GI: Normal bowel sounds; non-distended; soft, non-tender, no rebound, no guarding, no peritoneal signs, no hepatosplenomegaly, negative Murphy sign, no tenderness at McBurney's point BACK:  The back appears normal and is non-tender to palpation, there is no CVA tenderness EXT: Normal ROM in all joints; non-tender to palpation; no edema; normal capillary refill; no cyanosis, no calf tenderness or swelling    SKIN: Normal color for age and race; warm; no rash NEURO: Moves all extremities equally PSYCH: The patient's mood and manner are appropriate. Grooming and personal hygiene are appropriate.  MEDICAL DECISION MAKING: Patient here with likely viral gastroenteritis.  At this time he is hemodynamically stable with completely benign abdominal exam.  We will treat  symptomatically with IV fluids, Zofran, Imodium, Bentyl.  Will check labs, urine and EKG.  I do not feel this time he needs a CT of his abdomen pelvis.  Doubt appendicitis, cholelithiasis or cholecystitis, pancreatitis, bowel obstruction, diverticulitis, colitis.  ED PROGRESS: 4:25 AM  Pt's labs are unremarkable.  He reports feeling much better.  Repeat abdominal exam benign.  Urine pending.  We will fluid  challenge patient.   5:15 AM  Pt's urine shows no sign of infection and no ketones to suggest dehydration.  Drinking without difficulty.  I feel he is safe to be discharged.  Will discharge with prescriptions of Phenergan, Bentyl, Imodium.  Will provide work note.  Again suspect viral gastroenteritis.  Repeat abdominal exam benign.  At this time, I do not feel there is any life-threatening condition present. I have reviewed and discussed all results (EKG, imaging, lab, urine as appropriate) and exam findings with patient/family. I have reviewed nursing notes and appropriate previous records.  I feel the patient is safe to be discharged home without further emergent workup and can continue workup as an outpatient as needed. Discussed usual and customary return precautions. Patient/family verbalize understanding and are comfortable with this plan.  Outpatient follow-up has been provided if needed. All questions have been answered.    EKG Interpretation  Date/Time:  Wednesday December 03 2017 04:45:17 EST Ventricular Rate:  47 PR Interval:    QRS Duration: 88 QT Interval:  452 QTC Calculation: 400 R Axis:   31 Text Interpretation:  Sinus bradycardia Low voltage, precordial leads No significant change since last tracing other than rate is slower Confirmed by Tamber Burtch, Cyril Mourning [phone]) on 12/03/2017 4:48:30 AM           Dreonna Hussein, Delice Bison, DO 12/03/17 [id number]

## 2017-12-03 NOTE — ED Notes (Signed)
I attempted to collect labs and was unsuccessful. 
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
## 2019-01-28 ENCOUNTER — Ambulatory Visit (HOSPITAL_COMMUNITY)
Admission: EM | Admit: 2019-01-28 | Discharge: 2019-01-28 | Disposition: A | Discharge status: Home / Self Care | Payer: Self-pay | Attending: Family Medicine | Admitting: Family Medicine

## 2019-01-28 ENCOUNTER — Encounter (HOSPITAL_COMMUNITY): Payer: Self-pay | Admitting: Emergency Medicine

## 2019-01-28 DIAGNOSIS — Z0289 Encounter for other administrative examinations: Secondary | ICD-10-CM

## 2019-01-28 NOTE — ED Provider Notes (Signed)
[place]    CSN: [id number] Arrival date & time: 01/28/19  1846     History   Chief Complaint Chief Complaint  Patient presents with  . Letter for School/Work    HPI Logan Ward is a 38 y.o. male.   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  He states that on Monday he has had some nausea and was sent home from work.  He is scheduled to go back to work today but was unable to go without a note from work.  He had no vomiting or diarrhea.  He had no fever or chills.  He has no symptoms of influenza, or infectious disease.  He is here today simply because he needs a work note.  He feels well  Past Medical History:  Diagnosis Date  . Anal fissure   . Internal hemorrhoids   . Obesity   . Rectal ulcer     There are no active problems to display for this patient.   History reviewed. No pertinent surgical history.     Home Medications    Prior to Admission medications   Not on File    Family History Family History  Problem Relation Age of Onset  . Hypertension Father   . Throat cancer Father   . Heart attack Brother   . Colon cancer Neg Hx   . Esophageal cancer Neg Hx   . Stomach cancer Neg Hx     Social History Social History   Tobacco Use  . Smoking status: Never Smoker  . Smokeless tobacco: Never Used  Substance Use Topics  . Alcohol use: No  . Drug use: No     Allergies   Patient has no known allergies.   Review of Systems Review of Systems  Constitutional: Negative for chills and fever.  HENT: Negative for ear pain and sore throat.   Eyes: Negative for pain and visual disturbance.  Respiratory: Negative for cough and shortness of breath.   Cardiovascular: Negative for chest pain and palpitations.  Gastrointestinal: Negative for abdominal pain and vomiting.  Genitourinary: Negative for dysuria and hematuria.  Musculoskeletal: Negative for arthralgias and back pain.  Skin: Negative for color change and rash.    Neurological: Negative for seizures and syncope.  All other systems reviewed and are negative.    Physical Exam Triage Vital Signs ED Triage Vitals [01/28/19 1933]  Enc Vitals Group     BP 120/85     Pulse Rate 77     Resp 18     Temp 97.9 F (36.6 C)     Temp src      SpO2 99 %     Weight      Height      Head Circumference      Peak Flow      Pain Score 0     Pain Loc      Pain Edu?      Excl. in [place]?    No data found.  Updated Vital Signs BP 120/85   Pulse 77   Temp 97.9 F (36.6 C)   Resp 18   SpO2 99%   Visual Acuity Right Eye Distance:   Left Eye Distance:   Bilateral Distance:    Right Eye Near:   Left Eye Near:    Bilateral Near:     Physical Exam Constitutional:      General: He is not in acute distress.    Appearance:  He is well-developed.  HENT:     Head: Normocephalic and atraumatic.  Eyes:     Conjunctiva/sclera: Conjunctivae normal.     Pupils: Pupils are equal, round, and reactive to light.  Neck:     Musculoskeletal: Normal range of motion.  Cardiovascular:     Rate and Rhythm: Normal rate and regular rhythm.     Heart sounds: Normal heart sounds.  Pulmonary:     Effort: Pulmonary effort is normal. No respiratory distress.     Breath sounds: Normal breath sounds.  Abdominal:     General: Bowel sounds are normal. There is no distension.     Palpations: Abdomen is soft.     Tenderness: There is no abdominal tenderness.  Musculoskeletal: Normal range of motion.  Skin:    General: Skin is warm and dry.  Neurological:     General: No focal deficit present.     Mental Status: He is alert. Mental status is at baseline.  Psychiatric:        Mood and Affect: Mood normal.        Thought Content: Thought content normal.      UC Treatments / Results  Labs (all labs ordered are listed, but only abnormal results are displayed) Labs Reviewed - No data to display  EKG None  Radiology No results found.  Procedures Procedures  (including critical care time)  Medications Ordered in UC Medications - No data to display  Initial Impression / Assessment and Plan / UC Course  I have reviewed the triage vital signs and the nursing notes.  Pertinent labs & imaging results that were available during my care of the patient were reviewed by me and considered in my medical decision making (see chart for details).    No evidence of any illness, condition that would place patient or clients of the nursing home at risk Final Clinical Impressions(s) / UC Diagnoses   Final diagnoses:  Encounter for fitness for duty examination     Discharge Instructions     May return to full activity at the nursing home No evidence infection/disease   ED Prescriptions    None     Controlled Substance Prescriptions [place] Controlled Substance Registry consulted? Not Applicable   Raylene Everts, MD 01/28/19 2048

## 2019-01-28 NOTE — Discharge Instructions (Addendum)
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

## 2019-01-28 NOTE — ED Triage Notes (Signed)
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 States he feels 100% better but his job requires him to have a note saying he's healthy to return to work.

## 2019-08-15 ENCOUNTER — Other Ambulatory Visit: Payer: Self-pay

## 2019-08-15 ENCOUNTER — Emergency Department (HOSPITAL_COMMUNITY)
Admission: EM | Admit: 2019-08-15 | Discharge: 2019-08-15 | Disposition: A | Discharge status: Home / Self Care | Payer: Self-pay | Attending: Emergency Medicine | Admitting: Emergency Medicine

## 2019-08-15 ENCOUNTER — Encounter (HOSPITAL_COMMUNITY): Payer: Self-pay | Admitting: Emergency Medicine

## 2019-08-15 DIAGNOSIS — L03818 Cellulitis of other sites: Secondary | ICD-10-CM

## 2019-08-15 DIAGNOSIS — L03811 Cellulitis of head [any part, except face]: Secondary | ICD-10-CM | POA: Insufficient documentation

## 2019-08-15 DIAGNOSIS — L739 Follicular disorder, unspecified: Secondary | ICD-10-CM | POA: Insufficient documentation

## 2019-08-15 MED ORDER — DOXYCYCLINE HYCLATE 100 MG PO CAPS: 100.0000 mg | ORAL_CAPSULE | Freq: Two times a day (BID) | ORAL | 0 refills | Status: DC

## 2019-08-15 MED ORDER — KETOROLAC TROMETHAMINE 60 MG/2ML IM SOLN
60.0000 mg | Freq: Once | INTRAMUSCULAR | Status: AC
  Administered 2019-08-15: 60 mg via INTRAMUSCULAR
  Filled 2019-08-15: qty 2

## 2019-08-15 MED ORDER — TRAMADOL HCL 50 MG PO TABS: 50.0000 mg | ORAL_TABLET | Freq: Four times a day (QID) | ORAL | 0 refills | Status: DC | PRN
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
## 2019-08-15 NOTE — Discharge Instructions (Addendum)
Return here as needed.  Follow-up with your doctor.  Keep the area clean and dry.  You need to buy Hibiclens at the pharmacy as well.  Clean your scalp once a day with this.

## 2019-10-18 ENCOUNTER — Other Ambulatory Visit: Payer: Self-pay

## 2019-10-18 ENCOUNTER — Encounter (HOSPITAL_COMMUNITY): Payer: Self-pay | Admitting: Emergency Medicine

## 2019-10-18 ENCOUNTER — Emergency Department (HOSPITAL_COMMUNITY)
Admission: EM | Admit: 2019-10-18 | Discharge: 2019-10-18 | Disposition: A | Discharge status: Home / Self Care | Payer: Self-pay | Attending: Emergency Medicine | Admitting: Emergency Medicine

## 2019-10-18 DIAGNOSIS — Z20828 Contact with and (suspected) exposure to other viral communicable diseases: Secondary | ICD-10-CM | POA: Insufficient documentation

## 2019-10-18 DIAGNOSIS — R110 Nausea: Secondary | ICD-10-CM | POA: Insufficient documentation

## 2019-10-18 NOTE — ED Triage Notes (Signed)
Possible Covid 19 exposure Sunday. Says he has been nauseous since then. No other complaints.

## 2019-10-18 NOTE — ED Provider Notes (Signed)
[facility] DEPT Provider Note   CSN: [id number] Arrival date & time: 10/18/19  1542     History   Chief Complaint Chief Complaint  Patient presents with  . Nausea    HPI Logan Ward is a 38 y.o. male.     38yo male presents with complaint of nausea. Exposed to person with [place] yesterday as well as 5-6 days ago, person had been tested but was not in quarantine while awaiting results. Patient was exposed to this person 5-6 days ago and then again yesterday for about an hour, indoors without a mask.   Logan Ward was evaluated in Emergency Department on 10/18/2019 for the symptoms described in the history of present illness. He was evaluated in the context of the global COVID-19 pandemic, which necessitated consideration that the patient might be at risk for infection with the SARS-CoV-2 virus that causes COVID-19. Institutional protocols and algorithms that pertain to the evaluation of patients at risk for COVID-19 are in a state of rapid change based on information released by regulatory bodies including the CDC and federal and state organizations. These policies and algorithms were followed during the patient's care in the ED.      Past Medical History:  Diagnosis Date  . Anal fissure   . Internal hemorrhoids   . Obesity   . Rectal ulcer     There are no active problems to display for this patient.   History reviewed. No pertinent surgical history.      Home Medications    Prior to Admission medications   Medication Sig Start Date End Date Taking? Authorizing Provider  doxycycline (VIBRAMYCIN) 100 MG capsule Take 1 capsule (100 mg total) by mouth 2 (two) times daily. 08/15/19   Lawyer, Harrell Gave, PA-C  traMADol (ULTRAM) 50 MG tablet Take 1 tablet (50 mg total) by mouth every 6 (six) hours as needed for severe pain. 08/15/19   Dalia Heading, PA-C    Family History Family History  Problem Relation Age of Onset  .  Hypertension Father   . Throat cancer Father   . Heart attack Brother   . Colon cancer Neg Hx   . Esophageal cancer Neg Hx   . Stomach cancer Neg Hx     Social History Social History   Tobacco Use  . Smoking status: Never Smoker  . Smokeless tobacco: Never Used  Substance Use Topics  . Alcohol use: No  . Drug use: No     Allergies   Patient has no known allergies.   Review of Systems Review of Systems  Constitutional: Negative for chills and fever.  HENT: Negative for congestion, postnasal drip, rhinorrhea, sinus pressure, sinus pain, sneezing and sore throat.   Respiratory: Negative for cough and shortness of breath.   Cardiovascular: Negative for chest pain.  Gastrointestinal: Positive for nausea. Negative for abdominal pain, constipation, diarrhea and vomiting.  Genitourinary: Negative for difficulty urinating.  Musculoskeletal: Negative for arthralgias and myalgias.  Skin: Negative for rash and wound.  Allergic/Immunologic: Negative for immunocompromised state.  Neurological: Negative for weakness and headaches.  Hematological: Negative for adenopathy.  Psychiatric/Behavioral: Negative for confusion.  All other systems reviewed and are negative.    Physical Exam Updated Vital Signs BP 124/80   Pulse (!) 55   Temp 98.3 F (36.8 C) (Oral)   Resp 14   Ht 6\' 1"  (1.854 m)   Wt 106.6 kg   SpO2 100%   BMI 31.00 kg/m   Physical Exam  Vitals signs and nursing note reviewed.  Constitutional:      General: He is not in acute distress.    Appearance: He is well-developed. He is not diaphoretic.  HENT:     Head: Normocephalic and atraumatic.     Right Ear: Tympanic membrane and ear canal normal.     Left Ear: Tympanic membrane and ear canal normal.  Neck:     Musculoskeletal: Neck supple.  Cardiovascular:     Rate and Rhythm: Normal rate and regular rhythm.     Pulses: Normal pulses.     Heart sounds: Normal heart sounds.  Pulmonary:     Effort: Pulmonary  effort is normal.     Breath sounds: Normal breath sounds.  Lymphadenopathy:     Cervical: No cervical adenopathy.  Skin:    General: Skin is warm and dry.     Findings: No erythema or rash.  Neurological:     Mental Status: He is alert and oriented to person, place, and time.  Psychiatric:        Behavior: Behavior normal.      ED Treatments / Results  Labs (all labs ordered are listed, but only abnormal results are displayed) Labs Reviewed  NOVEL CORONAVIRUS, NAA (HOSP ORDER, SEND-OUT TO REF LAB; TAT 18-24 HRS)    EKG None  Radiology No results found.  Procedures Procedures (including critical care time)  Medications Ordered in ED Medications - No data to display   Initial Impression / Assessment and Plan / ED Course  I have reviewed the triage vital signs and the nursing notes.  Pertinent labs & imaging results that were available during my care of the patient were reviewed by me and considered in my medical decision making (see chart for details).  Clinical Course as of Oct 17 1646  Mon Oct 17, 4229  [id number] 38 year old male with complaint of nausea onset yesterday.  Patient is concerned individual who tested positive for Covid, states he is exposed and yesterday as well as a few days last week.  Denies any other symptoms or complaints. Send out Covid testing, advised to quarantine until he knows his results, may return to work with negative Covid swab if he does not develop any further symptoms.  If symptoms persist recommend outpatient testing.   [LM]    Clinical Course User Index [LM] Tacy Learn, PA-C      Final Clinical Impressions(s) / ED Diagnoses   Final diagnoses:  Nausea    ED Discharge Orders    None       Roque Lias 10/18/19 1647    Dorie Rank, MD 10/19/19 [phone]

## 2019-10-19 LAB — NOVEL CORONAVIRUS, NAA (HOSP ORDER, SEND-OUT TO REF LAB; TAT 18-24 HRS): SARS-CoV-2, NAA: NOT DETECTED

## 2019-12-09 ENCOUNTER — Other Ambulatory Visit: Payer: Self-pay

## 2019-12-09 ENCOUNTER — Emergency Department (HOSPITAL_COMMUNITY)
Admission: EM | Admit: 2019-12-09 | Discharge: 2019-12-09 | Disposition: A | Discharge status: Home / Self Care | Payer: Self-pay | Attending: Emergency Medicine | Admitting: Emergency Medicine

## 2019-12-09 ENCOUNTER — Encounter (HOSPITAL_COMMUNITY): Payer: Self-pay

## 2019-12-09 DIAGNOSIS — I10 Essential (primary) hypertension: Secondary | ICD-10-CM | POA: Insufficient documentation

## 2019-12-09 DIAGNOSIS — Z202 Contact with and (suspected) exposure to infections with a predominantly sexual mode of transmission: Secondary | ICD-10-CM | POA: Insufficient documentation

## 2019-12-09 LAB — URINALYSIS, ROUTINE W REFLEX MICROSCOPIC
Bacteria, UA: NONE SEEN
Bilirubin Urine: NEGATIVE
Glucose, UA: NEGATIVE mg/dL
Ketones, ur: NEGATIVE mg/dL
Nitrite: NEGATIVE
Protein, ur: NEGATIVE mg/dL
Specific Gravity, Urine: 1.02 (ref 1.005–1.030)
pH: 6 (ref 5.0–8.0)

## 2019-12-09 MED ORDER — AZITHROMYCIN 250 MG PO TABS
1000.0000 mg | ORAL_TABLET | Freq: Once | ORAL | Status: AC
  Administered 2019-12-09: 17:00:00 1000 mg via ORAL
  Filled 2019-12-09: qty 4

## 2019-12-09 MED ORDER — CEFTRIAXONE SODIUM 250 MG IJ SOLR
250.0000 mg | Freq: Once | INTRAMUSCULAR | Status: AC
  Administered 2019-12-09: 250 mg via INTRAMUSCULAR
  Filled 2019-12-09: qty 250

## 2019-12-09 MED ORDER — LIDOCAINE HCL 1 % IJ SOLN
INTRAMUSCULAR | Status: AC
  Administered 2019-12-09: 20 mL
  Filled 2019-12-09: qty 20

## 2019-12-09 NOTE — ED Triage Notes (Signed)
Pt reports he had unprotected sex on Sunday with somebody who recently tested positive for gonorrhea. Pt denies any symptoms of his own.

## 2019-12-09 NOTE — ED Provider Notes (Signed)
[facility] DEPT Provider Note   CSN: [id number] Arrival date & time: 12/09/19  1507     History Chief Complaint  Patient presents with  . Exposure to STD    Logan Ward is a 38 y.o. male presents today for concern of STI.  Patient reports that his male partner recently tested positive for gonorrhea.  Patient reports that over the past 1-2 days he has noticed a mild burning sensation only with urination that resolves when he stops urinating, nonradiating.  Patient denies any other symptoms or concerns today.  He is requesting testing and treatment for STI.  Denies fever/chills, headache, vision changes, neck pain, chest pain/shortness breath, abdominal pain, nausea/vomiting, rash/lesion, joint pain or any additional concerns. HPI     Past Medical History:  Diagnosis Date  . Anal fissure   . Internal hemorrhoids   . Obesity   . Rectal ulcer     There are no problems to display for this patient.   History reviewed. No pertinent surgical history.     Family History  Problem Relation Age of Onset  . Hypertension Father   . Throat cancer Father   . Heart attack Brother   . Colon cancer Neg Hx   . Esophageal cancer Neg Hx   . Stomach cancer Neg Hx     Social History   Tobacco Use  . Smoking status: Never Smoker  . Smokeless tobacco: Never Used  Substance Use Topics  . Alcohol use: No  . Drug use: No    Home Medications Prior to Admission medications   Medication Sig Start Date End Date Taking? Authorizing Provider  doxycycline (VIBRAMYCIN) 100 MG capsule Take 1 capsule (100 mg total) by mouth 2 (two) times daily. Patient not taking: Reported on 12/09/2019 08/15/19   Dalia Heading, PA-C  traMADol (ULTRAM) 50 MG tablet Take 1 tablet (50 mg total) by mouth every 6 (six) hours as needed for severe pain. Patient not taking: Reported on 12/09/2019 08/15/19   Dalia Heading, PA-C    Allergies    Patient has no known  allergies.  Review of Systems   Review of Systems Ten systems are reviewed and are negative for acute change except as noted in the HPI  Physical Exam Updated Vital Signs BP 133/81   Pulse 73   Temp 98.5 F (36.9 C) (Oral)   Resp 18   SpO2 99%   Physical Exam Constitutional:      General: He is not in acute distress.    Appearance: Normal appearance. He is well-developed. He is not ill-appearing or diaphoretic.  HENT:     Head: Normocephalic and atraumatic.     Right Ear: External ear normal.     Left Ear: External ear normal.     Nose: Nose normal.  Eyes:     General: Vision grossly intact. Gaze aligned appropriately.     Pupils: Pupils are equal, round, and reactive to light.  Neck:     Trachea: Trachea and phonation normal. No tracheal deviation.  Pulmonary:     Effort: Pulmonary effort is normal. No respiratory distress.  Abdominal:     General: There is no distension.     Palpations: Abdomen is soft.     Tenderness: There is no abdominal tenderness. There is no guarding or rebound.  Genitourinary:    Comments: Chaperone present during genital exam [employer].  No external genital lesions noted, no bumps on head of penis, specifically no vesicles concerning for  herpes or chancre suggestive of syphilis.  No pain with palpation of the penis/glans, no discharge or urethritis noted.  Scrotum and testicles without erythema/swelling or tenderness to palpation. Cremasteric reflex intact bilaterally. No palpable hernia noted.  Musculoskeletal:        General: Normal range of motion.     Cervical back: Normal range of motion.  Skin:    General: Skin is warm and dry.  Neurological:     Mental Status: He is alert.     GCS: GCS eye subscore is 4. GCS verbal subscore is 5. GCS motor subscore is 6.     Comments: Speech is clear and goal oriented, follows commands Major Cranial nerves without deficit, no facial droop Moves extremities without ataxia, coordination intact    Psychiatric:        Behavior: Behavior normal.     ED Results / Procedures / Treatments   Labs (all labs ordered are listed, but only abnormal results are displayed) Labs Reviewed  URINALYSIS, ROUTINE W REFLEX MICROSCOPIC - Abnormal; Notable for the following components:      Result Value   Hgb urine dipstick SMALL (*)    Leukocytes,Ua TRACE (*)    All other components within normal limits  RPR  HIV ANTIBODY (ROUTINE TESTING W REFLEX)  GC/CHLAMYDIA PROBE AMP ([place]) NOT AT [facility]    EKG None  Radiology No results found.  Procedures Procedures (including critical care time)  Medications Ordered in ED Medications  cefTRIAXone (ROCEPHIN) injection 250 mg (250 mg Intramuscular Given 12/09/19 1638)  azithromycin (ZITHROMAX) tablet 1,000 mg (1,000 mg Oral Given 12/09/19 1637)  lidocaine (XYLOCAINE) 1 % (with pres) injection (20 mLs  Given 12/09/19 1638)    ED Course  I have reviewed the triage vital signs and the nursing notes.  Pertinent labs & imaging results that were available during my care of the patient were reviewed by me and considered in my medical decision making (see chart for details).    MDM Rules/Calculators/A&P                      38 year old male presents today for concern of gonorrhea, a partner recently tested positive.  Patient reports 1-2 days of mild dysuria and no other symptoms.  Urinalysis today shows small leukocytes, hemoglobin and 21-50 WBCs.  No trichomonas seen.  Patient treated today with 1 g azithromycin and 250 mg Rocephin. Patient advised to inform and treat all sexual partners.  Pt advised on safe sex practices and understands that they have GC/Chlamydia cultures and HIV/syphilis tests pending and will result in 2-3 days. Patient encouraged to follow up at local health department for future STI checks. No concern for prostatitis, epididymitis or other emergent pathologies at this time.  At this time there does not appear to be any  evidence of an acute emergency medical condition and the patient appears stable for discharge with appropriate outpatient follow up. Diagnosis was discussed with patient who verbalizes understanding of care plan and is agreeable to discharge. I have discussed return precautions with patient who verbalizes understanding of return precautions. Patient encouraged to follow-up with their PCP. All questions answered.  Note: Portions of this report may have been transcribed using voice recognition software. Every effort was made to ensure accuracy; however, inadvertent computerized transcription errors may still be present. Final Clinical Impression(s) / ED Diagnoses Final diagnoses:  STD exposure    Rx / DC Orders ED Discharge Orders    None  Gari Crown 12/09/19 1739    Davonna Belling, MD 12/09/19 [phone]

## 2019-12-09 NOTE — Discharge Instructions (Addendum)
You have been diagnosed today with STD exposure.  At this time there does not appear to be the presence of an emergent medical condition, however there is always the potential for conditions to change. Please read and follow the below instructions.  Please return to the Emergency Department immediately for any new or worsening symptoms. Please be sure to follow up with your Primary Care Provider within one week regarding your visit today; please call their office to schedule an appointment even if you are feeling better for a follow-up visit. You have been treated presumptively today for gonorrhea and chlamydia. You have been tested today for gonorrhea and chlamydia as well as HIV and syphilis. These results will be available in approximately 3 days. You may check your MyChart account for results. Please inform all sexual partners of positive results and that they should be tested and treated as well. Please wait 2 weeks and be sure that you and your partners are symptom free before returning to sexual activity. Please use protection with every sexual encounter. Follow Up: Please followup with your primary doctor in 3 days for discussion of your diagnoses and further evaluation after today's visit; if you do not have a primary care doctor use the resource guide provided to find one; Please return to the ER for worsening symptoms, high fevers or persistent vomiting.  Get help right away if: You feel dizzy or faint. You have trouble breathing or have shortness of breath. You develop an irregular heartbeat. You have severe abdominal pain with or without shoulder pain. You develop any bumps or sores (lesions) on your skin. You develop warmth, redness, pain, or swelling around your joints, such as the knee. You have any new/concerning or worsening of symptoms.   Please read the additional information packets attached to your discharge summary.  Do not take your medicine if  develop an itchy rash,  swelling in your mouth or lips, or difficulty breathing; call 911 and seek immediate emergency medical attention if this occurs.  Note: Portions of this text may have been transcribed using voice recognition software. Every effort was made to ensure accuracy; however, inadvertent computerized transcription errors may still be present.

## 2019-12-10 LAB — RPR: RPR Ser Ql: NONREACTIVE

## 2019-12-10 LAB — HIV ANTIBODY (ROUTINE TESTING W REFLEX): HIV Screen 4th Generation wRfx: REACTIVE — AB

## 2019-12-13 LAB — PANEL 083904
HIV 1 AB: POSITIVE — AB
HIV 2 AB: NEGATIVE

## 2019-12-14 ENCOUNTER — Telehealth: Payer: Self-pay | Admitting: Infectious Disease

## 2019-12-14 NOTE — Telephone Encounter (Signed)
Can we send DIS to get pt connected to care. Appears to be new diagnosis

## 2019-12-15 NOTE — Telephone Encounter (Signed)
Thanks so much [name] 

## 2019-12-15 NOTE — Telephone Encounter (Signed)
Sent demographics, labs to DIS. Landis Gandy, RN

## 2020-11-18 ENCOUNTER — Encounter (HOSPITAL_COMMUNITY): Payer: Self-pay | Admitting: Emergency Medicine

## 2020-11-18 ENCOUNTER — Emergency Department (HOSPITAL_COMMUNITY)
Admission: EM | Admit: 2020-11-18 | Discharge: 2020-11-19 | Disposition: A | Discharge status: Home / Self Care | Payer: Self-pay | Attending: Emergency Medicine | Admitting: Emergency Medicine

## 2020-11-18 ENCOUNTER — Other Ambulatory Visit: Payer: Self-pay

## 2020-11-18 DIAGNOSIS — N342 Other urethritis: Secondary | ICD-10-CM

## 2020-11-18 DIAGNOSIS — N341 Nonspecific urethritis: Secondary | ICD-10-CM | POA: Insufficient documentation

## 2020-11-18 LAB — URINALYSIS, ROUTINE W REFLEX MICROSCOPIC
Bilirubin Urine: NEGATIVE
Glucose, UA: NEGATIVE mg/dL
Hgb urine dipstick: NEGATIVE
Ketones, ur: NEGATIVE mg/dL
Leukocytes,Ua: NEGATIVE
Nitrite: NEGATIVE
Protein, ur: NEGATIVE mg/dL
Specific Gravity, Urine: 1.025 (ref 1.005–1.030)
pH: 5 (ref 5.0–8.0)

## 2020-11-18 NOTE — ED Triage Notes (Signed)
Patient states 2 days ago he had sex with multiple people and is now having "tingling" and burning on urination. Patient states no discharge or fevers.

## 2020-11-19 MED ORDER — CEFTRIAXONE SODIUM 1 G IJ SOLR
500.0000 mg | Freq: Once | INTRAMUSCULAR | Status: AC
  Administered 2020-11-19: 500 mg via INTRAMUSCULAR
  Filled 2020-11-19: qty 10

## 2020-11-19 MED ORDER — LIDOCAINE HCL 1 % IJ SOLN
INTRAMUSCULAR | Status: AC
  Administered 2020-11-19: 2.1 mL
  Filled 2020-11-19: qty 20

## 2020-11-19 MED ORDER — DOXYCYCLINE HYCLATE 100 MG PO TABS
100.0000 mg | ORAL_TABLET | Freq: Once | ORAL | Status: AC
  Administered 2020-11-19: 100 mg via ORAL
  Filled 2020-11-19: qty 1

## 2020-11-19 MED ORDER — DOXYCYCLINE HYCLATE 100 MG PO CAPS: 100.0000 mg | ORAL_CAPSULE | Freq: Two times a day (BID) | ORAL | 0 refills | Status: DC

## 2020-11-19 NOTE — ED Notes (Signed)
Patient states he does not want blood drawn.

## 2020-11-19 NOTE — ED Provider Notes (Signed)
[facility] DEPT Provider Note   CSN: [id number] Arrival date & time: 11/18/20  2221   History Chief Complaint  Patient presents with  . SEXUALLY TRANSMITTED DISEASE    Logan Ward is a 39 y.o. male.  The history is provided by the patient.  He comes in complaining of a burning with urination today.  He denies any urethral discharge.  He does admit to having had unprotected sex.  He denies abdominal pain, nausea, vomiting.  He denies fever or chills.  Past Medical History:  Diagnosis Date  . Anal fissure   . Internal hemorrhoids   . Obesity   . Rectal ulcer     There are no problems to display for this patient.   History reviewed. No pertinent surgical history.     Family History  Problem Relation Age of Onset  . Hypertension Father   . Throat cancer Father   . Heart attack Brother   . Colon cancer Neg Hx   . Esophageal cancer Neg Hx   . Stomach cancer Neg Hx     Social History   Tobacco Use  . Smoking status: Never Smoker  . Smokeless tobacco: Never Used  Vaping Use  . Vaping Use: Never used  Substance Use Topics  . Alcohol use: No  . Drug use: No    Home Medications Prior to Admission medications   Medication Sig Start Date End Date Taking? Authorizing Provider  doxycycline (VIBRAMYCIN) 100 MG capsule Take 1 capsule (100 mg total) by mouth 2 (two) times daily. 83/4/19   Delora Fuel, MD    Allergies    Patient has no known allergies.  Review of Systems   Review of Systems  All other systems reviewed and are negative.   Physical Exam Updated Vital Signs BP 131/74 (BP Location: Left Arm)   Pulse 86   Temp 98.3 F (36.8 C) (Oral)   Resp 16   Ht 6\' 1"  (1.854 m)   Wt 117.9 kg   SpO2 99%   BMI 34.30 kg/m   Physical Exam Vitals and nursing note reviewed.   39 year old male, resting comfortably and in no acute distress. Vital signs are normal. Oxygen saturation is 99%, which is normal. Head is  normocephalic and atraumatic. PERRLA, EOMI. Oropharynx is clear. Neck is nontender and supple without adenopathy or JVD. Back is nontender and there is no CVA tenderness. Lungs are clear without rales, wheezes, or rhonchi. Chest is nontender. Heart has regular rate and rhythm without murmur. Abdomen is soft, flat, nontender without masses or hepatosplenomegaly and peristalsis is normoactive. Extremities have no cyanosis or edema, full range of motion is present. Skin is warm and dry without rash. Neurologic: Mental status is normal, cranial nerves are intact, there are no motor or sensory deficits.  ED Results / Procedures / Treatments   Labs (all labs ordered are listed, but only abnormal results are displayed) Labs Reviewed  URINALYSIS, ROUTINE W REFLEX MICROSCOPIC  RPR  HIV ANTIBODY (ROUTINE TESTING W REFLEX)  GC/CHLAMYDIA PROBE AMP ([place]) NOT AT [facility]   Procedures Procedures  Medications Ordered in ED Medications  cefTRIAXone (ROCEPHIN) injection 500 mg (has no administration in time range)  doxycycline (VIBRA-TABS) tablet 100 mg (has no administration in time range)    ED Course  I have reviewed the triage vital signs and the nursing notes.  Pertinent lab results that were available during my care of the patient were reviewed by me and considered in  my medical decision making (see chart for details).  MDM Rules/Calculators/A&P Acute urethritis-probably gonorrhea or chlamydia.  Specimens are sent for STI testing.  RPR and HIV tests have been ordered, although patient is stating that he does not want to have any blood drawn.  Importance of testing for the sexually transmitted infections that are otherwise asymptomatic was stressed.  He is given an injection of ceftriaxone and discharged with prescription for doxycycline.  Old records are reviewed, and he does have prior ED visits for sexually transmitted infections.  Final Clinical Impression(s) / ED Diagnoses Final  diagnoses:  Urethritis    Rx / DC Orders ED Discharge Orders         Ordered    doxycycline (VIBRAMYCIN) 100 MG capsule  2 times daily        11/19/20 0211           Delora Fuel, MD 17/35/67 0107

## 2020-11-20 LAB — GC/CHLAMYDIA PROBE AMP (~~LOC~~) NOT AT ARMC
Chlamydia: POSITIVE — AB
Comment: NEGATIVE
Comment: NORMAL
Neisseria Gonorrhea: NEGATIVE

## 2021-04-25 ENCOUNTER — Encounter (HOSPITAL_COMMUNITY): Payer: Self-pay | Admitting: Emergency Medicine

## 2021-04-25 ENCOUNTER — Other Ambulatory Visit: Payer: Self-pay

## 2021-04-25 ENCOUNTER — Ambulatory Visit (HOSPITAL_COMMUNITY)
Admission: EM | Admit: 2021-04-25 | Discharge: 2021-04-25 | Disposition: A | Discharge status: Home / Self Care | Payer: Self-pay | Attending: Family Medicine | Admitting: Family Medicine

## 2021-04-25 DIAGNOSIS — R21 Rash and other nonspecific skin eruption: Secondary | ICD-10-CM

## 2021-04-25 MED ORDER — PREDNISONE 20 MG PO TABS: 40.0000 mg | ORAL_TABLET | Freq: Every day | ORAL | 0 refills | Status: DC

## 2021-04-25 NOTE — ED Triage Notes (Signed)
Pt presents with rash all over xs 1 week. 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

## 2021-04-25 NOTE — Discharge Instructions (Signed)
You may take Benadryl in addition to the medication prescribed.

## 2021-04-26 NOTE — ED Provider Notes (Signed)
  Logan Ward   [id number] 04/25/21 Arrival Time: [place]:  1. Rash and nonspecific skin eruption   With hive-like properties. Unclear etiology.  Begin trial of: Meds ordered this encounter  Medications  . predniSONE (DELTASONE) 20 MG tablet    Sig: Take 2 tablets (40 mg total) by mouth daily.    Dispense:  10 tablet    Refill:  0     Follow-up Information    [facility].   Specialty: Urgent Care Why: If worsening or failing to improve as anticipated. Contact information: [place] [place] [phone]              Reviewed expectations re: course of current medical issues. Questions answered. Outlined signs and symptoms indicating need for more acute intervention. Patient verbalized understanding. After Visit Summary given.   SUBJECTIVE:  Logan Ward is a 40 y.o. male who presents with a skin complaint. Itchy rash. "All over". Approx 1 week. No new meds or exposures. 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 No tx PTA.  OBJECTIVE: Vitals:   04/25/21 1932  BP: 126/70  Pulse: 72  Resp: 18  Temp: 99 F (37.2 C)  TempSrc: Oral  SpO2: 100%    General appearance: alert; no distress HEENT: [place]; AT Neck: supple with FROM Lungs: clear to auscultation bilaterally Heart: regular rate and rhythm Extremities: no edema; moves all extremities normally Skin: warm and dry; signs of infection: no; smooth, slightly elevated and erythematous plaques of variable size over his torso and extremities Psychological: alert and cooperative; normal mood and affect  No Known Allergies  Past Medical History:  Diagnosis Date  . Anal fissure   . Internal hemorrhoids   . Obesity   . Rectal ulcer    Social History   Socioeconomic History  . Marital status: Single    Spouse name: Not on file  . Number of children: Not on file  . Years of education: Not on file  . Highest education level: Not on  file  Occupational History  . Not on file  Tobacco Use  . Smoking status: Never Smoker  . Smokeless tobacco: Never Used  Vaping Use  . Vaping Use: Never used  Substance and Sexual Activity  . Alcohol use: No  . Drug use: No  . Sexual activity: Not on file  Other Topics Concern  . Not on file  Social History Narrative  . Not on file   Social Determinants of Health   Financial Resource Strain: Not on file  Food Insecurity: Not on file  Transportation Needs: Not on file  Physical Activity: Not on file  Stress: Not on file  Social Connections: Not on file  Intimate Partner Violence: Not on file   Family History  Problem Relation Age of Onset  . Hypertension Father   . Throat cancer Father   . Heart attack Brother   . Colon cancer Neg Hx   . Esophageal cancer Neg Hx   . Stomach cancer Neg Hx    History reviewed. No pertinent surgical history.   Vanessa Kick, MD 04/26/21 [phone]

## 2021-09-22 ENCOUNTER — Ambulatory Visit (HOSPITAL_COMMUNITY)
Admission: EM | Admit: 2021-09-22 | Discharge: 2021-09-22 | Disposition: A | Discharge status: Home / Self Care | Payer: Self-pay | Attending: Physician Assistant | Admitting: Physician Assistant

## 2021-09-22 ENCOUNTER — Other Ambulatory Visit: Payer: Self-pay

## 2021-09-22 DIAGNOSIS — U071 COVID-19: Secondary | ICD-10-CM | POA: Insufficient documentation

## 2021-09-22 NOTE — ED Triage Notes (Addendum)
Pt is present today for a covid test and to be cleared to return to work.

## 2021-09-22 NOTE — Discharge Instructions (Addendum)
Your symptoms have improved so there is no need to start any medication.  We will contact you if your retest is positive but monitor your MyChart for negative results.  If you have any recurrent or worsening symptoms please return for reevaluation.

## 2021-09-22 NOTE — ED Provider Notes (Signed)
[facility]    CSN: [id number] Arrival date & time: 09/22/21  1755      History   Chief Complaint Chief Complaint  Patient presents with   Medical Clearance    HPI Logan Ward is a 40 y.o. male.   Patient presents today with a weeklong history of COVID-19 symptoms.  Reports that his symptoms have since resolved but he is required by his employer to have repeat COVID testing and they would not accept an at-home COVID test.  Reports that 1 week ago (09/15/2021) he developed some congestion and cough.  He then started feeling poorly and took an at-home COVID test that was negative.  He repeated his test on 09/16/2021 and reports it was positive.  He then continued to have congestion symptoms as well as change in sense of taste and smell.  He has been vaccinated boosted.  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  He has not had COVID in the past.  He denies any significant past medical history including asthma, COPD, smoking, diabetes, immunosuppression.  Reports that his symptoms have since resolved and he is no longer experiencing any symptoms but requires work note stating he was evaluated as well as COVID testing.   Past Medical History:  Diagnosis Date   Anal fissure    Internal hemorrhoids    Obesity    Rectal ulcer     There are no problems to display for this patient.   No past surgical history on file.     Home Medications    Prior to Admission medications   Not on File    Family History Family History  Problem Relation Age of Onset   Hypertension Father    Throat cancer Father    Heart attack Brother    Colon cancer Neg Hx    Esophageal cancer Neg Hx    Stomach cancer Neg Hx     Social History Social History   Tobacco Use   Smoking status: Never   Smokeless tobacco: Never  Vaping Use   Vaping Use: Never used  Substance Use Topics   Alcohol use: No   Drug use: No     Allergies   Patient has no  known allergies.   Review of Systems Review of Systems  Constitutional:  Negative for activity change, appetite change, fatigue and fever.  HENT:  Negative for congestion, sinus pressure, sneezing and sore throat.   Respiratory:  Negative for cough and shortness of breath.   Cardiovascular:  Negative for chest pain.  Gastrointestinal:  Negative for abdominal pain, diarrhea, nausea and vomiting.  Neurological:  Negative for dizziness, light-headedness and headaches.    Physical Exam Triage Vital Signs ED Triage Vitals  Enc Vitals Group     BP 09/22/21 1818 111/61     Pulse Rate 09/22/21 1818 67     Resp --      Temp 09/22/21 1818 97.7 F (36.5 C)     Temp src --      SpO2 09/22/21 1818 97 %     Weight --      Height --      Head Circumference --      Peak Flow --      Pain Score 09/22/21 1817 0     Pain Loc --      Pain Edu? --      Excl. in GC? --    No  data found.  Updated Vital Signs BP 111/61   Pulse 67   Temp 97.7 F (36.5 C)   SpO2 97%   Visual Acuity Right Eye Distance:   Left Eye Distance:   Bilateral Distance:    Right Eye Near:   Left Eye Near:    Bilateral Near:     Physical Exam Vitals reviewed.  Constitutional:      General: He is awake.     Appearance: Normal appearance. He is well-developed. He is not ill-appearing.     Comments: Very pleasant male appears stated age in no acute distress sitting comfortably in exam room  HENT:     Head: Normocephalic and atraumatic.     Right Ear: Tympanic membrane, ear canal and external ear normal. Tympanic membrane is not erythematous or bulging.     Left Ear: Tympanic membrane, ear canal and external ear normal. Tympanic membrane is not erythematous or bulging.     Nose: Nose normal.     Mouth/Throat:     Pharynx: Uvula midline. No oropharyngeal exudate or posterior oropharyngeal erythema.  Cardiovascular:     Rate and Rhythm: Normal rate and regular rhythm.     Heart sounds: Normal heart sounds, S1  normal and S2 normal. No murmur heard. Pulmonary:     Effort: Pulmonary effort is normal. No accessory muscle usage or respiratory distress.     Breath sounds: Normal breath sounds. No stridor. No wheezing, rhonchi or rales.     Comments: Clear to auscultation bilaterally Abdominal:     General: Bowel sounds are normal.     Palpations: Abdomen is soft.     Tenderness: There is no abdominal tenderness.  Neurological:     Mental Status: He is alert.  Psychiatric:        Behavior: Behavior is cooperative.     UC Treatments / Results  Labs (all labs ordered are listed, but only abnormal results are displayed) Labs Reviewed  SARS CORONAVIRUS 2 (TAT 6-24 HRS)    EKG   Radiology No results found.  Procedures Procedures (including critical care time)  Medications Ordered in UC Medications - No data to display  Initial Impression / Assessment and Plan / UC Course  I have reviewed the triage vital signs and the nursing notes.  Pertinent labs & imaging results that were available during my care of the patient were reviewed by me and considered in my medical decision making (see chart for details).      Vital signs and physical exam are reassuring today with no indication for emergent evaluation or imaging.  Patient reports that he is currently asymptomatic.  Discussed that there is no recommendation for repeat COVID testing but patient reports this is required by his employer.  PCR COVID test was obtained-results pending.  Discussed that this could be positive for up to 90 days and a positive test does not impact his ability to go back to work.  Patient expressed understanding.  He was given a work excuse note indicating that he was evaluated today with current CDC return to work guidelines.  Patient is able to return to work but continue to mask given positive test date of 09-16-2021.  Discussed alarm symptoms that warrant emergent evaluation.  Strict return precautions given to which  he expressed understanding.  Final Clinical Impressions(s) / UC Diagnoses   Final diagnoses:  [id number]     Discharge Instructions      Your symptoms have improved so there is no need to  start any medication.  We will contact you if your retest is positive but monitor your MyChart for negative results.  If you have any recurrent or worsening symptoms please return for reevaluation.     ED Prescriptions   None    PDMP not reviewed this encounter.   Terrilee Croak, PA-C 09/22/21 1845

## 2021-09-23 LAB — SARS CORONAVIRUS 2 (TAT 6-24 HRS): SARS Coronavirus 2: NEGATIVE

## 2021-12-03 ENCOUNTER — Emergency Department (HOSPITAL_COMMUNITY)
Admission: EM | Admit: 2021-12-03 | Discharge: 2021-12-03 | Disposition: A | Discharge status: Home / Self Care | Payer: Self-pay | Attending: Emergency Medicine | Admitting: Emergency Medicine

## 2021-12-03 ENCOUNTER — Encounter (HOSPITAL_COMMUNITY): Payer: Self-pay

## 2021-12-03 DIAGNOSIS — Z202 Contact with and (suspected) exposure to infections with a predominantly sexual mode of transmission: Secondary | ICD-10-CM | POA: Insufficient documentation

## 2021-12-03 DIAGNOSIS — K6289 Other specified diseases of anus and rectum: Secondary | ICD-10-CM | POA: Insufficient documentation

## 2021-12-03 MED ORDER — DOXYCYCLINE HYCLATE 100 MG PO CAPS: 100.0000 mg | ORAL_CAPSULE | Freq: Two times a day (BID) | ORAL | 0 refills | Status: DC

## 2021-12-03 MED ORDER — STERILE WATER FOR INJECTION IJ SOLN
INTRAMUSCULAR | Status: AC
  Administered 2021-12-03: 09:00:00 1 mL
  Filled 2021-12-03: qty 10

## 2021-12-03 MED ORDER — CEFTRIAXONE SODIUM 1 G IJ SOLR
500.0000 mg | Freq: Once | INTRAMUSCULAR | Status: AC
  Administered 2021-12-03: 09:00:00 500 mg via INTRAMUSCULAR
  Filled 2021-12-03: qty 10

## 2021-12-03 NOTE — ED Triage Notes (Signed)
Pt presents with c/o rectal burning that has been going on for 2 weeks. Pt denies any other symptoms.

## 2021-12-03 NOTE — ED Provider Notes (Signed)
[facility] DEPT Provider Note   CSN: [id number] Arrival date & time: 12/03/21  0802     History Chief Complaint  Patient presents with   Rectal Pain    DEZMOND DOWNIE is a 40 y.o. male.  HPI 40 year old male presents today complaining of rectal burning.  He states that he went to a Dance movement psychotherapist.  He had a sexual encounter with a couple.  He reports that he did not actually place his penis and neither of them or have any receptive intercourse except with toys.  He states that they called him and told him that they had gonorrhea.  He feels he is having symptoms consistent with this.  He reports a past history of gonorrhea about 6 months ago for which she was treated.  He denies ever having chlamydia, syphilis, or HIV.  He states he has been tested in the past.  Although chief complaint says abdominal pain.  Patient denies any abdominal pain.  He denies any other symptoms.  He is not having urinary tract infection symptoms or urethritis symptoms.  Specifically denies any burning with urination.     Past Medical History:  Diagnosis Date   Anal fissure    Internal hemorrhoids    Obesity    Rectal ulcer     There are no problems to display for this patient.   History reviewed. No pertinent surgical history.     Family History  Problem Relation Age of Onset   Hypertension Father    Throat cancer Father    Heart attack Brother    Colon cancer Neg Hx    Esophageal cancer Neg Hx    Stomach cancer Neg Hx     Social History   Tobacco Use   Smoking status: Never   Smokeless tobacco: Never  Vaping Use   Vaping Use: Never used  Substance Use Topics   Alcohol use: No   Drug use: No    Home Medications Prior to Admission medications   Medication Sig Start Date End Date Taking? Authorizing Provider  doxycycline (VIBRAMYCIN) 100 MG capsule Take 1 capsule (100 mg total) by mouth 2 (two) times daily. 12/03/21  Yes Pattricia Boss, MD     Allergies    Patient has no known allergies.  Review of Systems   Review of Systems  All other systems reviewed and are negative.  Physical Exam Updated Vital Signs BP (!) 145/82 (BP Location: Right Arm)    Pulse 72    Temp (!) 97.4 F (36.3 C) (Oral)    Resp 18    SpO2 100%   Physical Exam Vitals and nursing note reviewed.  Constitutional:      Appearance: Normal appearance. He is obese.  HENT:     Head: Normocephalic.     Right Ear: External ear normal.     Left Ear: External ear normal.     Nose: Nose normal.     Mouth/Throat:     Pharynx: Oropharynx is clear.  Eyes:     Pupils: Pupils are equal, round, and reactive to light.  Cardiovascular:     Rate and Rhythm: Normal rate.     Pulses: Normal pulses.  Pulmonary:     Effort: Pulmonary effort is normal.  Abdominal:     General: Abdomen is flat.     Palpations: Abdomen is soft.  Genitourinary:    Rectum: Normal.  Musculoskeletal:        General: Normal range of motion.  Cervical back: Normal range of motion.  Skin:    General: Skin is warm.     Capillary Refill: Capillary refill takes less than 2 seconds.  Neurological:     General: No focal deficit present.     Mental Status: He is alert.  Psychiatric:        Mood and Affect: Mood normal.    ED Results / Procedures / Treatments   Labs (all labs ordered are listed, but only abnormal results are displayed) Labs Reviewed  RPR  HIV ANTIBODY (ROUTINE TESTING W REFLEX)  GC/CHLAMYDIA PROBE AMP ([facility]) NOT AT [facility]    EKG None  Radiology No results found.  Procedures Procedures   Medications Ordered in ED Medications  cefTRIAXone (ROCEPHIN) injection 500 mg (has no administration in time range)    ED Course  I have reviewed the triage vital signs and the nursing notes.  Pertinent labs & imaging results that were available during my care of the patient were reviewed by me and considered in my medical decision making (see chart for  details).    MDM Rules/Calculators/A&P                          Patient presents today with known exposure to sexually transmitted infection.  He is complaining of rectal burning.  He declines urethral swab.  Rectal swab sent.  Patient offered RPR and HIV testing. Plan treat here empirically for GC and chlamydia. Patient advised regarding safer sex. He is advised regarding follow-up at health department   Final Clinical Impression(s) / ED Diagnoses Final diagnoses:  STD exposure    Rx / DC Orders ED Discharge Orders          Ordered    doxycycline (VIBRAMYCIN) 100 MG capsule  2 times daily        12/03/21 5852             Pattricia Boss, MD 12/03/21 [phone]

## 2021-12-03 NOTE — ED Notes (Signed)
Pt refused blood draw

## 2021-12-04 LAB — GC/CHLAMYDIA PROBE AMP (~~LOC~~) NOT AT ARMC
Chlamydia: NEGATIVE
Comment: NEGATIVE
Comment: NORMAL
Neisseria Gonorrhea: NEGATIVE

## 2022-02-10 ENCOUNTER — Other Ambulatory Visit: Payer: Self-pay

## 2022-02-10 ENCOUNTER — Ambulatory Visit (HOSPITAL_COMMUNITY)
Admission: EM | Admit: 2022-02-10 | Discharge: 2022-02-10 | Disposition: A | Discharge status: Home / Self Care | Payer: Self-pay

## 2022-02-10 ENCOUNTER — Encounter (HOSPITAL_COMMUNITY): Payer: Self-pay | Admitting: *Deleted

## 2022-02-10 DIAGNOSIS — R432 Parageusia: Secondary | ICD-10-CM

## 2022-02-10 DIAGNOSIS — U071 COVID-19: Secondary | ICD-10-CM

## 2022-02-10 NOTE — Discharge Instructions (Addendum)
Please remain out of work for 5 additional days.  If your fever is gone and your symptoms are improving, you may return to work and wear a mask.  If your symptoms are not improving, please stay out of work for 10 days.

## 2022-02-10 NOTE — ED Provider Notes (Signed)
[facility]    CSN: [id number] Arrival date & time: 02/10/22  1731      History   Chief Complaint Chief Complaint  Patient presents with   Headache   Diarrhea   loss of taste    HPI Logan Ward is a 41 y.o. male.   Patient reports he works at a [employer] facility.  He reports he tested positive for COVID 9 days ago and was asymptomatic until today.  He reports he woke up today with a headache, diarrhea, and a fever of 101.3.  He took some Tylenol and went back to sleep.  When he woke up again, his fever is still present.  He went to lunch and ordered crab legs, however he could not taste a crab legs.  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  Patient denies congestion, cough, shortness of breath, wheezing, chest pain, chest tightness.  He is not nauseous or vomiting.     Past Medical History:  Diagnosis Date   Anal fissure    Internal hemorrhoids    Obesity    Rectal ulcer     There are no problems to display for this patient.   History reviewed. No pertinent surgical history.     Home Medications    Prior to Admission medications   Not on File    Family History Family History  Problem Relation Age of Onset   Hypertension Father    Throat cancer Father    Heart attack Brother    Colon cancer Neg Hx    Esophageal cancer Neg Hx    Stomach cancer Neg Hx     Social History Social History   Tobacco Use   Smoking status: Never   Smokeless tobacco: Never  Vaping Use   Vaping Use: Never used  Substance Use Topics   Alcohol use: No   Drug use: No     Allergies   Patient has no known allergies.   Review of Systems Review of Systems Per HPI  Physical Exam Triage Vital Signs ED Triage Vitals  Enc Vitals Group     BP 02/10/22 1836 112/74     Pulse Rate 02/10/22 1836 62     Resp 02/10/22 1836 18     Temp 02/10/22 1836 98.9 F (37.2 C)     Temp src  --      SpO2 02/10/22 1836 98 %     Weight --      Height --      Head Circumference --      Peak Flow --      Pain Score 02/10/22 1834 8     Pain Loc --      Pain Edu? --      Excl. in [place]? --    No data found.  Updated Vital Signs BP 112/74    Pulse 62    Temp 98.9 F (37.2 C)    Resp 18    SpO2 98%   Visual Acuity Right Eye Distance:   Left Eye Distance:   Bilateral Distance:    Right Eye Near:   Left Eye Near:    Bilateral Near:     Physical Exam Vitals and nursing note reviewed.  Constitutional:      General: He is not in acute distress.    Appearance: Normal appearance. He is not ill-appearing or toxic-appearing.  HENT:  Head: Normocephalic and atraumatic.     Right Ear: Tympanic membrane, ear canal and external ear normal.     Left Ear: Tympanic membrane, ear canal and external ear normal.     Nose: No congestion or rhinorrhea.     Mouth/Throat:     Mouth: Mucous membranes are moist.     Pharynx: Oropharynx is clear. Posterior oropharyngeal erythema present. No oropharyngeal exudate.  Eyes:     General: No scleral icterus. Cardiovascular:     Rate and Rhythm: Normal rate and regular rhythm.  Pulmonary:     Effort: Pulmonary effort is normal. No respiratory distress.     Breath sounds: Normal breath sounds. No wheezing, rhonchi or rales.  Musculoskeletal:     Cervical back: Normal range of motion.  Lymphadenopathy:     Cervical: No cervical adenopathy.  Skin:    General: Skin is warm and dry.     Coloration: Skin is not jaundiced or pale.     Findings: No erythema or rash.  Neurological:     Mental Status: He is alert and oriented to person, place, and time.     Motor: No weakness.  Psychiatric:        Mood and Affect: Mood normal.        Behavior: Behavior normal.     UC Treatments / Results  Labs (all labs ordered are listed, but only abnormal results are displayed) Labs Reviewed - No data to display  EKG   Radiology No results  found.  Procedures Procedures (including critical care time)  Medications Ordered in UC Medications - No data to display  Initial Impression / Assessment and Plan / UC Course  I have reviewed the triage vital signs and the nursing notes.  Pertinent labs & imaging results that were available during my care of the patient were reviewed by me and considered in my medical decision making (see chart for details).    Patient has a positive for COVID 9 days ago.  He reports symptoms have not started until today.  He started with fever, altered taste, headache, diarrhea today.  Therefore, I encouraged he remain out of work for at least 5 more days.  If his symptoms are improved after 5 days, he may return to work as long as he wears a mask.  If not, he should remain out of work for 5 additional days.  Note given for work.  Encouraged symptomatic care with Tylenol/ibuprofen, pushing hydration with Pedialyte or sugar-free electrolyte drinks.  With any sudden onset new chest pain, dizziness, sweating, or shortness of breath, go to ED.  Final Clinical Impressions(s) / UC Diagnoses   Final diagnoses:  COVID-19 virus infection  Altered taste     Discharge Instructions      Please remain out of work for 5 additional days.  If your fever is gone and your symptoms are improving, you may return to work and wear a mask.  If your symptoms are not improving, please stay out of work for 10 days.      ED Prescriptions   None    PDMP not reviewed this encounter.   Eulogio Bear, NP 02/10/22 [phone]

## 2022-02-10 NOTE — ED Triage Notes (Signed)
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 Today Pt has had diarrhes ,HA and loss of taste.

## 2022-06-07 ENCOUNTER — Encounter (HOSPITAL_COMMUNITY): Payer: Self-pay | Admitting: Emergency Medicine

## 2022-06-07 ENCOUNTER — Other Ambulatory Visit: Payer: Self-pay

## 2022-06-07 ENCOUNTER — Emergency Department (HOSPITAL_COMMUNITY)
Admission: EM | Admit: 2022-06-07 | Discharge: 2022-06-07 | Disposition: A | Discharge status: Home / Self Care | Payer: Self-pay | Attending: Emergency Medicine | Admitting: Emergency Medicine

## 2022-06-07 DIAGNOSIS — R300 Dysuria: Secondary | ICD-10-CM | POA: Insufficient documentation

## 2022-06-07 LAB — URINALYSIS, ROUTINE W REFLEX MICROSCOPIC
Bilirubin Urine: NEGATIVE
Glucose, UA: NEGATIVE mg/dL
Hgb urine dipstick: NEGATIVE
Ketones, ur: NEGATIVE mg/dL
Leukocytes,Ua: NEGATIVE
Nitrite: NEGATIVE
Protein, ur: NEGATIVE mg/dL
Specific Gravity, Urine: 1.025 (ref 1.005–1.030)
pH: 6 (ref 5.0–8.0)

## 2022-06-07 MED ORDER — STERILE WATER FOR INJECTION IJ SOLN
INTRAMUSCULAR | Status: AC
  Filled 2022-06-07: qty 10

## 2022-06-07 MED ORDER — DOXYCYCLINE HYCLATE 100 MG PO CAPS: 100.0000 mg | ORAL_CAPSULE | Freq: Two times a day (BID) | ORAL | 0 refills | Status: AC

## 2022-06-07 MED ORDER — CEFTRIAXONE SODIUM 1 G IJ SOLR
500.0000 mg | Freq: Once | INTRAMUSCULAR | Status: AC
  Administered 2022-06-07: 500 mg via INTRAMUSCULAR
  Filled 2022-06-07: qty 10

## 2022-06-07 MED ORDER — DOXYCYCLINE HYCLATE 100 MG PO TABS
100.0000 mg | ORAL_TABLET | Freq: Once | ORAL | Status: AC
  Administered 2022-06-07: 100 mg via ORAL
  Filled 2022-06-07: qty 1

## 2022-06-07 NOTE — ED Triage Notes (Signed)
Pt would like to be tested for STD. States there is burning in his private area.

## 2022-06-10 LAB — GC/CHLAMYDIA PROBE AMP (~~LOC~~) NOT AT ARMC
Chlamydia: NEGATIVE
Comment: NEGATIVE
Comment: NORMAL
Neisseria Gonorrhea: NEGATIVE

## 2022-07-19 ENCOUNTER — Encounter (HOSPITAL_COMMUNITY): Payer: Self-pay

## 2022-07-19 ENCOUNTER — Ambulatory Visit (HOSPITAL_COMMUNITY)
Admission: EM | Admit: 2022-07-19 | Discharge: 2022-07-19 | Disposition: A | Discharge status: Home / Self Care | Payer: Self-pay | Attending: Urgent Care | Admitting: Urgent Care

## 2022-07-19 DIAGNOSIS — K1120 Sialoadenitis, unspecified: Secondary | ICD-10-CM | POA: Insufficient documentation

## 2022-07-19 LAB — CBC WITH DIFFERENTIAL/PLATELET
Abs Immature Granulocytes: 0.01 10*3/uL (ref 0.00–0.07)
Basophils Absolute: 0 10*3/uL (ref 0.0–0.1)
Basophils Relative: 1 %
Eosinophils Absolute: 0 10*3/uL (ref 0.0–0.5)
Eosinophils Relative: 1 %
HCT: 40.3 % (ref 39.0–52.0)
Hemoglobin: 13.3 g/dL (ref 13.0–17.0)
Immature Granulocytes: 0 %
Lymphocytes Relative: 55 %
Lymphs Abs: 1.9 10*3/uL (ref 0.7–4.0)
MCH: 28.2 pg (ref 26.0–34.0)
MCHC: 33 g/dL (ref 30.0–36.0)
MCV: 85.4 fL (ref 80.0–100.0)
Monocytes Absolute: 0.3 10*3/uL (ref 0.1–1.0)
Monocytes Relative: 9 %
Neutro Abs: 1.1 10*3/uL — ABNORMAL LOW (ref 1.7–7.7)
Neutrophils Relative %: 34 %
Platelets: 155 10*3/uL (ref 150–400)
RBC: 4.72 MIL/uL (ref 4.22–5.81)
RDW: 12.5 % (ref 11.5–15.5)
WBC: 3.3 10*3/uL — ABNORMAL LOW (ref 4.0–10.5)
nRBC: 0 % (ref 0.0–0.2)

## 2022-07-19 MED ORDER — AMOXICILLIN-POT CLAVULANATE 875-125 MG PO TABS: 1.0000 | ORAL_TABLET | Freq: Two times a day (BID) | ORAL | 0 refills | Status: DC

## 2022-07-19 NOTE — Discharge Instructions (Signed)
You are most likely suffering with a salivary gland stone causing obstruction. Initial treatment includes continuing to eat tart hard candies such as lemon drops or war heads.  It may be helpful to apply a warm compress to the area and massage the gland.  Maintain hydration by drinking plenty of water.  If you take any over-the-counter medications such as Zyrtec or Benadryl, stop these immediately. We drew a CBC today to determine if there may be a bacterial component. I have prescribed you an antibiotic to help prevent a bacterial infection. Please call ENT today to schedule follow-up as imaging with ultrasound may be necessary.

## 2022-07-19 NOTE — ED Triage Notes (Signed)
Pt c/o swelling to rt side of neck x1wk. Denies pain.

## 2022-07-19 NOTE — ED Provider Notes (Signed)
[facility]    CSN: [id number] Arrival date & time: 07/19/22  1114      History   Chief Complaint Chief Complaint  Patient presents with   swollen neck    HPI Logan Ward is a 41 y.o. male.   Pleasant 41 year old male with no known medical history who takes no daily medications presents today due to right sided neck swelling for the past 4 days.  States it started out small and has gotten bigger.  Denies any pain.  Denies any recent URI symptoms.  States he is fully vaccinated against measles mumps and rubella.  No known COVID exposures.  Denies any dental pain or infections.  Denies fevers.  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  He has been doing this for the past 2 days and it has not helped.  He denies any headache or difficulty moving his neck.  Patient able to fully open his jaw.  Denies any recent fatigue or sore throat.     Past Medical History:  Diagnosis Date   Anal fissure    Internal hemorrhoids    Obesity    Rectal ulcer     There are no problems to display for this patient.   History reviewed. No pertinent surgical history.     Home Medications    Prior to Admission medications   Medication Sig Start Date End Date Taking? Authorizing Provider  amoxicillin-clavulanate (AUGMENTIN) 875-125 MG tablet Take 1 tablet by mouth 2 (two) times daily with a meal for 7 days. 07/19/22 07/26/22 Yes Dayzee Trower L, PA    Family History Family History  Problem Relation Age of Onset   Hypertension Father    Throat cancer Father    Heart attack Brother    Colon cancer Neg Hx    Esophageal cancer Neg Hx    Stomach cancer Neg Hx     Social History Social History   Tobacco Use   Smoking status: Never   Smokeless tobacco: Never  Vaping Use   Vaping Use: Never used  Substance Use Topics   Alcohol use: No   Drug use: No     Allergies   Patient has no known  allergies.   Review of Systems Review of Systems  Constitutional:  Negative for appetite change, fatigue and fever.  HENT:  Positive for facial swelling (R neck). Negative for sore throat.   Hematological:  Negative for adenopathy.  All other systems reviewed and are negative.    Physical Exam Triage Vital Signs ED Triage Vitals [07/19/22 1132]  Enc Vitals Group     BP 124/76     Pulse Rate 64     Resp 18     Temp 98.8 F (37.1 C)     Temp Source Oral     SpO2 98 %     Weight      Height      Head Circumference      Peak Flow      Pain Score 0     Pain Loc      Pain Edu?      Excl. in [place]?    No data found.  Updated Vital Signs BP 124/76 (BP Location: Left Arm)   Pulse 64   Temp 98.8 F (37.1 C) (Oral)   Resp 18   SpO2 98%   Visual Acuity Right Eye Distance:  Left Eye Distance:   Bilateral Distance:    Right Eye Near:   Left Eye Near:    Bilateral Near:     Physical Exam Vitals and nursing note reviewed.  Constitutional:      General: He is not in acute distress.    Appearance: Normal appearance. He is not ill-appearing, toxic-appearing or diaphoretic.  HENT:     Head: Normocephalic and atraumatic.     Right Ear: Tympanic membrane, ear canal and external ear normal. There is no impacted cerumen.     Left Ear: Tympanic membrane, ear canal and external ear normal. There is no impacted cerumen.     Nose: Nose normal. No congestion or rhinorrhea.     Mouth/Throat:     Lips: Pink.     Mouth: Mucous membranes are moist. No oral lesions.     Pharynx: Oropharynx is clear. Uvula midline. No oropharyngeal exudate, posterior oropharyngeal erythema or uvula swelling.     Tonsils: No tonsillar exudate.     Comments: Missing tooth on R lower jaw, but no obvious signs of infection Eyes:     General: No scleral icterus.       Right eye: No discharge.        Left eye: No discharge.     Extraocular Movements: Extraocular movements intact.     Conjunctiva/sclera:  Conjunctivae normal.     Pupils: Pupils are equal, round, and reactive to light.  Neck:     Thyroid: No thyroid mass, thyromegaly or thyroid tenderness.      Comments: Golf-ball sized indurated mobile non-tender mass, likely swollen R submandibular glad. No lymphadenopathy Normal appearing skin FROM neck No lymphadenopathy Cardiovascular:     Rate and Rhythm: Normal rate.     Pulses: Normal pulses.  Pulmonary:     Effort: Pulmonary effort is normal. No respiratory distress.  Musculoskeletal:     Cervical back: Normal range of motion and neck supple. No edema, erythema, rigidity or tenderness. No pain with movement, spinous process tenderness or muscular tenderness. Normal range of motion.  Lymphadenopathy:     Cervical: No cervical adenopathy.     Right cervical: No superficial, deep or posterior cervical adenopathy.    Left cervical: No superficial, deep or posterior cervical adenopathy.  Skin:    General: Skin is warm and dry.     Coloration: Skin is not jaundiced.     Findings: No bruising, erythema or rash.  Neurological:     General: No focal deficit present.     Mental Status: He is alert and oriented to person, place, and time.      UC Treatments / Results  Labs (all labs ordered are listed, but only abnormal results are displayed) Labs Reviewed  CBC WITH DIFFERENTIAL/PLATELET    EKG   Radiology No results found.  Procedures Procedures (including critical care time)  Medications Ordered in UC Medications - No data to display  Initial Impression / Assessment and Plan / UC Course  I have reviewed the triage vital signs and the nursing notes.  Pertinent labs & imaging results that were available during my care of the patient were reviewed by me and considered in my medical decision making (see chart for details).     Sialoadenitis - CBC obtained to assess for possible developing infection. Pt is fully vaccinated against MMR. No URI or dental sx. Suspect  likely secondary to stone, however pt has failed conservative management. Will refer to ENT to see if advanced imaging needed.  Will start augmentin to prevent secondary bacterial infection and to cover for possible dental cause given his poor dentition on R lower jaw. Follow up with ENT next week, head to ER if severe pain or fever develops.   Final Clinical Impressions(s) / UC Diagnoses   Final diagnoses:  Sialoadenitis     Discharge Instructions      You are most likely suffering with a salivary gland stone causing obstruction. Initial treatment includes continuing to eat tart hard candies such as lemon drops or war heads.  It may be helpful to apply a warm compress to the area and massage the gland.  Maintain hydration by drinking plenty of water.  If you take any over-the-counter medications such as Zyrtec or Benadryl, stop these immediately. We drew a CBC today to determine if there may be a bacterial component. I have prescribed you an antibiotic to help prevent a bacterial infection. Please call ENT today to schedule follow-up as imaging with ultrasound may be necessary.     ED Prescriptions     Medication Sig Dispense Auth. Provider   amoxicillin-clavulanate (AUGMENTIN) 875-125 MG tablet Take 1 tablet by mouth 2 (two) times daily with a meal for 7 days. 14 tablet Lucky Trotta L, [place]      PDMP not reviewed this encounter.   Chaney Malling, [place] 07/19/22 1244

## 2022-07-23 ENCOUNTER — Other Ambulatory Visit (HOSPITAL_COMMUNITY): Payer: Self-pay

## 2022-07-23 ENCOUNTER — Emergency Department (HOSPITAL_COMMUNITY): Payer: Self-pay

## 2022-07-23 ENCOUNTER — Telehealth (HOSPITAL_COMMUNITY): Payer: Self-pay | Admitting: Pharmacy Technician

## 2022-07-23 ENCOUNTER — Encounter (HOSPITAL_COMMUNITY): Payer: Self-pay

## 2022-07-23 ENCOUNTER — Emergency Department (HOSPITAL_COMMUNITY)
Admission: EM | Admit: 2022-07-23 | Discharge: 2022-07-23 | Disposition: A | Discharge status: Home / Self Care | Payer: Self-pay | Attending: Emergency Medicine | Admitting: Emergency Medicine

## 2022-07-23 ENCOUNTER — Other Ambulatory Visit: Payer: Self-pay

## 2022-07-23 DIAGNOSIS — R221 Localized swelling, mass and lump, neck: Secondary | ICD-10-CM

## 2022-07-23 DIAGNOSIS — B20 Human immunodeficiency virus [HIV] disease: Secondary | ICD-10-CM

## 2022-07-23 DIAGNOSIS — A749 Chlamydial infection, unspecified: Secondary | ICD-10-CM

## 2022-07-23 DIAGNOSIS — A539 Syphilis, unspecified: Secondary | ICD-10-CM

## 2022-07-23 LAB — CBC WITH DIFFERENTIAL/PLATELET
Abs Immature Granulocytes: 0.01 10*3/uL (ref 0.00–0.07)
Basophils Absolute: 0 10*3/uL (ref 0.0–0.1)
Basophils Relative: 1 %
Eosinophils Absolute: 0 10*3/uL (ref 0.0–0.5)
Eosinophils Relative: 1 %
HCT: 42.7 % (ref 39.0–52.0)
Hemoglobin: 13.6 g/dL (ref 13.0–17.0)
Immature Granulocytes: 1 %
Lymphocytes Relative: 51 %
Lymphs Abs: 1.1 10*3/uL (ref 0.7–4.0)
MCH: 28.2 pg (ref 26.0–34.0)
MCHC: 31.9 g/dL (ref 30.0–36.0)
MCV: 88.6 fL (ref 80.0–100.0)
Monocytes Absolute: 0.3 10*3/uL (ref 0.1–1.0)
Monocytes Relative: 12 %
Neutro Abs: 0.7 10*3/uL — ABNORMAL LOW (ref 1.7–7.7)
Neutrophils Relative %: 34 %
Platelets: 160 10*3/uL (ref 150–400)
RBC: 4.82 MIL/uL (ref 4.22–5.81)
RDW: 12.9 % (ref 11.5–15.5)
WBC: 2.1 10*3/uL — ABNORMAL LOW (ref 4.0–10.5)
nRBC: 0 % (ref 0.0–0.2)

## 2022-07-23 LAB — BASIC METABOLIC PANEL
Anion gap: 5 (ref 5–15)
BUN: 11 mg/dL (ref 6–20)
CO2: 25 mmol/L (ref 22–32)
Calcium: 9.1 mg/dL (ref 8.9–10.3)
Chloride: 109 mmol/L (ref 98–111)
Creatinine, Ser: 0.99 mg/dL (ref 0.61–1.24)
GFR, Estimated: >60 mL/min (ref >60)
Glucose, Bld: 96 mg/dL (ref 70–99)
Potassium: 4.2 mmol/L (ref 3.5–5.1)
Sodium: 139 mmol/L (ref 135–145)

## 2022-07-23 LAB — RAPID HIV SCREEN (HIV 1/2 AB+AG)
HIV 1/2 Antibodies: REACTIVE — AB
HIV-1 P24 Antigen - HIV24: NONREACTIVE

## 2022-07-23 LAB — MONONUCLEOSIS SCREEN: Mono Screen: NEGATIVE

## 2022-07-23 MED ORDER — SODIUM CHLORIDE (PF) 0.9 % IJ SOLN
INTRAMUSCULAR | Status: AC
  Filled 2022-07-23: qty 50

## 2022-07-23 MED ORDER — BICTEGRAVIR-EMTRICITAB-TENOFOV 50-200-25 MG PO TABS
1.0000 | ORAL_TABLET | Freq: Every day | ORAL | Status: DC
  Administered 2022-07-23: 1 via ORAL
  Filled 2022-07-23: qty 1

## 2022-07-23 MED ORDER — BICTEGRAVIR-EMTRICITAB-TENOFOV 50-200-25 MG PO TABS
1.0000 | ORAL_TABLET | Freq: Every day | ORAL | 0 refills | Status: DC
Start: 2022-07-23 — End: 2022-08-29
  Filled 2022-07-23 (×2): qty 30, 30d supply, fill #0

## 2022-07-23 MED ORDER — AMOXICILLIN-POT CLAVULANATE 875-125 MG PO TABS
1.0000 | ORAL_TABLET | Freq: Two times a day (BID) | ORAL | 0 refills | Status: AC
  Filled 2022-07-23: qty 14, 7d supply, fill #0

## 2022-07-23 MED ORDER — IOHEXOL 300 MG/ML  SOLN
75.0000 mL | Freq: Once | INTRAMUSCULAR | Status: AC | PRN
  Administered 2022-07-23: 75 mL via INTRAVENOUS

## 2022-07-23 NOTE — Discharge Instructions (Addendum)
You were seen in the ED today with neck mass/swelling. We are starting you on Augmentin for the next week. Please take as directed. After speaking with our ENT on call, they agree with a plan for outpatient follow up and you can call [phone]. They can help coordinate a follow up when you call that number. You may also benefit from seeing a dentist for dental x-ray to make sure this is not the source of swelling. I have listed the name/information for a dentist on this form.   The infectious disease doctors will also be following up with you at an appointment on Thursday. Please keep that appointment.

## 2022-07-23 NOTE — Consult Note (Addendum)
[facility] for Infectious Disease    Date of Admission:  07/23/2022     Reason for Consult: Positive HIV test     Referring Physician: Dr Laverta Baltimore   ASSESSMENT:    41 y.o. 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  I suspect he may have fairly advanced disease given evidence of leukopenia and having been untreated for the past several years at least.  Risk factors include having sex with men and women. Right sided neck swelling: ED provider discussed with ENT and will plan for Augmentin empirically with close outpatient follow up.  No signs of abscess or deep space infection on imaging right now. Given concern for advanced HIV would also consider atypical infection such as MAC.  Will need to be monitored closely. History of syphilis: Seen at urgent care in May 2022 with RPR titer 1:16 and prescribed doxycycline x 7 days.  Not entirely clear if he took this treat course through to completion but nonetheless was inadequately treated from what is available in the chart. History of chlamydia urethritis: Treated with doxycycline x7 days in December 2021.  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  RECOMMENDATIONS:    Discussed with patient my suspicion for true HIV infection.  I recommend and he agrees to start [facility] today and will give meds to the bedside Appointment at [facility] made for this Thursday 07/25/22 Check RPR again but recommend re-treatment for late latent syphilis which can be done outpatient Check oral GC/CT today given concern for oropharyngeal infection Will plan to check urinary and rectal swabs as well in the clinic For now, agree with Augmentin and ENT follow up Monitor closely.  If neck swelling worsens with ART initiation would consider unmasking of OI such as MAC or possibly malignancy and may need biopsy if more prominent LAD  develops (or does not improve) Check viral load and CD 4 count Will defer other HIV intake labs for clinic visit  ADDENDUM 3:24 PM -- notified by RN in the ER that lab does not have swabs to do oral GC/CT so will defer testing to his follow up appointment with [facility] on 07/25/22   Active Problems:   * No active hospital problems. *   MEDICATIONS:    Scheduled Meds:  bictegravir-emtricitabine-tenofovir AF  1 tablet Oral Daily   Continuous Infusions: PRN Meds:.  HPI:    Logan Ward is a 41 y.o. male with no significant past medical history who presented to the emergency department at [facility] today for evaluation of slowly worsening neck swelling.  Patient was recently evaluated at the urgent care center on 07/19/2022 due to a similar issue with right-sided neck swelling.  He was prescribed Augmentin at that time for possible bacterial infection, however, the urgent care provider felt that this may be secondary to a stone.  He was advised to follow-up with ENT following that urgent care visit, however, he presented today with gradual worsening despite 2 to 3 days of Augmentin.  He underwent a CT scan of the neck with contrast this morning to further evaluate and was noted to have nonspecific infectious or inflammatory process of the right neck and submandibular region without low-density component to suggest an abscess or evidence of any deep space extension.  He also had mild nonsuppurative lymph node enlargement that was felt to likely be reactive.  These  findings were discussed with ENT on-call who reviewed the imaging.  They agree with need for ENT follow-up and recommended continuing Augmentin as well.  Lab work was obtained as part of his workup in the emergency department which was notable for normal kidney function, WBC 2.1, mononucleosis screen negative.  Of note his rapid HIV screen was reactive with HIV/2 antibodies.  P24 antigen was nonreactive.  This has been sent for HIV  1/2 antibody differentiation assay.  Of note, in 2020 patient also had a reactive fourth-generation screening test with subsequent positive HIV-1 antibody confirming the result at that time (labeled in Epic as Panel [id number]).  There has been no further HIV testing that I see since that time.  It was attempted to get patient established into care in December 2020 at the time of this positive test however he was unable to be connected to care for unclear reasons.  He reports today that he was told that his testing was a false positive.  He also reports that he has had follow-up HIV testing since that time which has been negative.    Since December 2020 he was seen at urgent care in December 2021 for urethritis and tested positive for chlamydia (treated with doxycycline).  Seen as well in May 2022 for rectal pain.  He was noted to have rectal lesions consistent with syphilis per the documentation.  He was prescribed doxycycline 100 mg twice daily x 7 days at that time which he reports taking.  His RPR at that time was 1: 16.  His HSV 2 PCR was also positive at that time.     Past Medical History:  Diagnosis Date   Anal fissure    Internal hemorrhoids    Obesity    Rectal ulcer     Social History   Tobacco Use   Smoking status: Never   Smokeless tobacco: Never  Vaping Use   Vaping Use: Never used  Substance Use Topics   Alcohol use: No   Drug use: No    Family History  Problem Relation Age of Onset   Hypertension Father    Throat cancer Father    Heart attack Brother    Colon cancer Neg Hx    Esophageal cancer Neg Hx    Stomach cancer Neg Hx     No Known Allergies  Review of Systems  Constitutional: Negative.   HENT:         Right sided mandibular and neck swelling.   Eyes: Negative.   Respiratory: Negative.    Cardiovascular: Negative.   Gastrointestinal: Negative.   Genitourinary: Negative.   Musculoskeletal: Negative.   Skin: Negative.   All other systems reviewed and  are negative.   OBJECTIVE:   Blood pressure 123/80, pulse (!) 54, temperature 98.3 F (36.8 C), temperature source Oral, resp. rate 17, height _0  (1.854 m), weight 127 kg, SpO2 100 %. Body mass index is 36.94 kg/m.  Physical Exam Constitutional:      General: He is not in acute distress.    Appearance: Normal appearance.  HENT:     Head: Normocephalic and atraumatic.     Comments: Right sided mandibular and neck swelling appreciated. No warmth, erythema.  Able to speak in full sentences and swallow with issues.     Nose: Nose normal.  Eyes:     Extraocular Movements: Extraocular movements intact.     Conjunctiva/sclera: Conjunctivae normal.  Pulmonary:     Effort: Pulmonary effort is normal. No respiratory  distress.  Abdominal:     General: There is no distension.     Palpations: Abdomen is soft.  Musculoskeletal:        General: Normal range of motion.  Skin:    General: Skin is warm and dry.  Neurological:     General: No focal deficit present.     Mental Status: He is alert and oriented to person, place, and time.  Psychiatric:        Mood and Affect: Mood normal.        Behavior: Behavior normal.      Lab Results: Lab Results  Component Value Date   WBC 2.1 (L) 07/23/2022   HGB 13.6 07/23/2022   HCT 42.7 07/23/2022   MCV 88.6 07/23/2022   PLT 160 07/23/2022    Lab Results  Component Value Date   NA 139 07/23/2022   K 4.2 07/23/2022   CO2 25 07/23/2022   GLUCOSE 96 07/23/2022   BUN 11 07/23/2022   CREATININE 0.99 07/23/2022   CALCIUM 9.1 07/23/2022   GFRNONAA >60 07/23/2022   GFRAA >60 12/03/2017    Lab Results  Component Value Date   ALT 47 12/03/2017   AST 62 (H) 12/03/2017   ALKPHOS 69 12/03/2017   BILITOT 1.6 (H) 12/03/2017    No results found for: "CRP"  No results found for: "ESRSEDRATE"  I have reviewed the micro and lab results in Epic.  Imaging: CT Soft Tissue Neck W Contrast  Result Date: 07/23/2022 CLINICAL DATA:  Right  lateral neck pain for 1 week EXAM: CT NECK WITH CONTRAST TECHNIQUE: Multidetector CT imaging of the neck was performed using the standard protocol following the bolus administration of intravenous contrast. RADIATION DOSE REDUCTION: This exam was performed according to the departmental dose-optimization program which includes automated exposure control, adjustment of the mA and/or kV according to patient size and/or use of iterative reconstruction technique. CONTRAST:  6m OMNIPAQUE IOHEXOL 300 MG/ML  SOLN COMPARISON:  No prior CT neck, correlation is made with CT cervical spine 07/03/2020 FINDINGS: In the right submandibular and lower right face region, there is increased soft tissue that appears to be involved with the platysma, measuring up to 9.5 x 5.0 x 3.4 cm (AP x TR x CC) (series 3, image 65 and series 7, image 36) without a low-density component or evidence of deep space extension. No evidence of significant contrast enhancement. Fat stranding extends into the left submandibular subcutaneous fat. Pharynx and larynx: Normal. No mass or swelling. Salivary glands: Inflammatory changes lateral to the right submandibular gland, without significant stranding about the majority of the submandibular gland, suggesting that this is not actually source of the inflammatory process in the right neck. The left submandibular gland and bilateral parotid glands are unremarkable without evidence of inflammation, mass, or stone. Thyroid: Normal. Lymph nodes: Large left level 1A lymph node measures up to 11 mm in short axis (series 3, image 69), likely reactive. Possible enlarged right level 1 B lymph node measures up to 8 mm in short axis (series 3, image 56). Vascular: Patent. Limited intracranial: Negative. Visualized orbits: Negative. Mastoids and visualized paranasal sinuses: Clear. Skeleton: No acute osseous abnormality. Upper chest: No focal pulmonary opacity or pleural effusion. IMPRESSION: Nonspecific infectious or  inflammatory process of the right neck and submandibular region, without low-density component to suggest abscess or evidence of deep space extension. Mild non-suppurative nodal enlargement, likely reactive. Electronically Signed   By: AMerilyn BabaM.D.   On: 07/23/2022 12:04  Imaging independently reviewed in Epic.  Rayn[facility] for [facility]ious [facility] [phone] pager 07/23/2022, 2:34 PM

## 2022-07-23 NOTE — ED Notes (Signed)
CRITICAL VALUE STICKER  CRITICAL VALUE:HIV antibody reactive, antigen non reactive.   RECEIVER (on-site recipient of call):I. Evans  DATE & TIME NOTIFIED: 07/23/22 1319  MESSENGER (representative from lab):Kerlandia  MD NOTIFIED: [place]  RESPONSE:

## 2022-07-23 NOTE — ED Triage Notes (Signed)
Patient c/o right lateral neck pain x 1 week. Patient denies any injury , numbness or tingling of extremities.

## 2022-07-23 NOTE — Telephone Encounter (Signed)
Patient Advocate Encounter  Completed and sent [employer] Advancing Access application for [place] for this patient who is uninsured.      BIN      [id number] PCN    [id number] [id number]    [id number] ID        [id number]     Lyndel Safe, [place] Patient Advocate Specialist [place] Patient Advocate Team Direct Number: [phone]  Fax: [phone]

## 2022-07-23 NOTE — ED Provider Notes (Signed)
Emergency Department Provider Note   I have reviewed the triage vital signs and the nursing notes.   HISTORY  Chief Complaint Neck Pain   HPI Logan Ward is a 41 y.o. male with PMH reviewed below who presented to the emergency department for evaluation of neck swelling, gradually worsening over the past week.  No injury.  Patient notes that he has had progressive swelling to this area.  No fever.  No radiation into the arms or lower back.  No difficulty breathing or swallowing.  He states that he was evaluated by Urgent Care and curbside with ENT, and diagnosed with sialoadenitis.  He has been treating as instructed but symptoms persist.   Past Medical History:  Diagnosis Date   Anal fissure    Internal hemorrhoids    Obesity    Rectal ulcer     Review of Systems  Constitutional: No fever/chills ENT: No sore throat. Positive right lateral and anterior neck swelling.  Cardiovascular: Denies chest pain. Respiratory: Denies shortness of breath. Gastrointestinal: No abdominal pain. Musculoskeletal: Negative for back pain. Skin: Negative for rash. Neurological: Negative for headaches.   ____________________________________________   PHYSICAL EXAM:  VITAL SIGNS: ED Triage Vitals  Enc Vitals Group     BP 07/23/22 0835 138/79     Pulse Rate 07/23/22 0835 61     Resp 07/23/22 0835 18     Temp 07/23/22 0835 98.5 F (36.9 C)     Temp Source 07/23/22 0835 Oral     SpO2 07/23/22 0835 97 %     Weight 07/23/22 0835 280 lb (127 kg)     Height 07/23/22 0835 '6\' 1"'$  (1.854 m)   Constitutional: Alert and oriented. Well appearing and in no acute distress. Eyes: Conjunctivae are normal.  Head: Atraumatic. Nose: No congestion/rhinnorhea. Mouth/Throat: Mucous membranes are moist.  Oropharynx non-erythematous.  No peritonsillar abscess.  Patient speaking in a clear voice and managing oral secretions.  Neck: No stridor.  Patient with asymmetric swelling to the left lateral and  anterior neck without overlying erythema or cellulitis.  No fluctuance.  The area is mildly tender to palpation.  Cardiovascular: Normal rate, regular rhythm. Good peripheral circulation. Grossly normal heart sounds.   Respiratory: Normal respiratory effort.  No retractions. Lungs CTAB. Gastrointestinal: Soft and nontender. No distention.  Musculoskeletal: No gross deformities of extremities. Neurologic:  Normal speech and language. No gross focal neurologic deficits are appreciated.  Skin:  Skin is warm, dry and intact. No rash noted.   ____________________________________________   LABS (all labs ordered are listed, but only abnormal results are displayed)  Labs Reviewed  CBC WITH DIFFERENTIAL/PLATELET - Abnormal; Notable for the following components:      Result Value   WBC 2.1 (*)    Neutro Abs 0.7 (*)    All other components within normal limits  RAPID HIV SCREEN (HIV 1/2 AB+AG) - Abnormal; Notable for the following components:   HIV 1/2 Antibodies Reactive (*)    All other components within normal limits  HIV-1/2 AB - DIFFERENTIATION - Abnormal; Notable for the following components:   Note SEE COMMENT (*)    All other components within normal limits  T-HELPER CELLS (CD4) COUNT (NOT AT [facility]) - Abnormal; Notable for the following components:   CD4 T Cell Abs 115 (*)    CD4 % Helper T Cell 13 (*)    All other components within normal limits  BASIC METABOLIC PANEL  MONONUCLEOSIS SCREEN  PATHOLOGIST SMEAR REVIEW  HIV-1 RNA  QUANT-NO REFLEX-BLD  RPR   ____________________________________________  RADIOLOGY  CT Soft Tissue Neck W Contrast  Result Date: 07/23/2022 CLINICAL DATA:  Right lateral neck pain for 1 week EXAM: CT NECK WITH CONTRAST TECHNIQUE: Multidetector CT imaging of the neck was performed using the standard protocol following the bolus administration of intravenous contrast. RADIATION DOSE REDUCTION: This exam was performed according to the departmental  dose-optimization program which includes automated exposure control, adjustment of the mA and/or kV according to patient size and/or use of iterative reconstruction technique. CONTRAST:  82m OMNIPAQUE IOHEXOL 300 MG/ML  SOLN COMPARISON:  No prior CT neck, correlation is made with CT cervical spine 07/03/2020 FINDINGS: In the right submandibular and lower right face region, there is increased soft tissue that appears to be involved with the platysma, measuring up to 9.5 x 5.0 x 3.4 cm (AP x TR x CC) (series 3, image 65 and series 7, image 36) without a low-density component or evidence of deep space extension. No evidence of significant contrast enhancement. Fat stranding extends into the left submandibular subcutaneous fat. Pharynx and larynx: Normal. No mass or swelling. Salivary glands: Inflammatory changes lateral to the right submandibular gland, without significant stranding about the majority of the submandibular gland, suggesting that this is not actually source of the inflammatory process in the right neck. The left submandibular gland and bilateral parotid glands are unremarkable without evidence of inflammation, mass, or stone. Thyroid: Normal. Lymph nodes: Large left level 1A lymph node measures up to 11 mm in short axis (series 3, image 69), likely reactive. Possible enlarged right level 1 B lymph node measures up to 8 mm in short axis (series 3, image 56). Vascular: Patent. Limited intracranial: Negative. Visualized orbits: Negative. Mastoids and visualized paranasal sinuses: Clear. Skeleton: No acute osseous abnormality. Upper chest: No focal pulmonary opacity or pleural effusion. IMPRESSION: Nonspecific infectious or inflammatory process of the right neck and submandibular region, without low-density component to suggest abscess or evidence of deep space extension. Mild non-suppurative nodal enlargement, likely reactive. Electronically Signed   By: AMerilyn BabaM.D.   On: 07/23/2022 12:04     ____________________________________________   PROCEDURES  Procedure(s) performed:   Procedures  None  ____________________________________________   INITIAL IMPRESSION / ASSESSMENT AND PLAN / ED COURSE  Pertinent labs & imaging results that were available during my care of the patient were reviewed by me and considered in my medical decision making (see chart for details).   This patient is Presenting for Evaluation of neck swelling, which does require a range of treatment options, and is a complaint that involves a high risk of morbidity and mortality.  The Differential Diagnoses include sialoadenitis, goiter, neck abscess, lymphoma, mononucleosis, etc.  Critical Interventions-    Medications  sodium chloride (PF) 0.9 % injection (  Given by Other 07/23/22 1148)  iohexol (OMNIPAQUE) 300 MG/ML solution 75 mL (75 mLs Intravenous Contrast Given 07/23/22 1130)    Reassessment after intervention: Symptoms improved.    I decided to review pertinent External Data, and in summary patient seen in urgent care on 8/4 with presumptive diagnosis as discussed above. No imaging at that time.   Clinical Laboratory Tests Ordered, included CBC with leukopenia and absolute neutrophil count of 0.7.  Monoscreen negative.  No acute kidney injury or electrolyte disturbance.  Radiologic Tests Ordered, included CT neck. I independently interpreted the images and agree with radiology interpretation.   Cardiac Monitor Tracing which shows NSR.   Social Determinants of Health Risk patient is  a non-smoker.   Consult complete with ENT on call,. Dr. Marcelline Deist. Reviewed CT imaging and case by phone. Agrees with need for ENT follow up and Augmentin x 1 week. May benefit form dental follow up for panorex prior to ENT follow up. Will give ENT contact information to establish with local provider.   Infectious disease, Dr. Juleen China. Appreciate consultation on this patient. See consult note and recommendations.    Medical Decision Making: Summary:  Patient presents emergency department with right neck swelling.  This does not appear to be obstructing or impeding on the airway requiring immediate management.  Sialoadenitis is a consideration but given the persistent symptoms, despite treatment, do plan for CT imaging of the neck along with labs.  Have added a monoscreen but no fevers.  Patient has been describing some fatigue.   Reevaluation with update and discussion with patient.  His positive HIV antibody test seen from 2020, patient recalls this and states that he had a confirmatory test which was negative although I do not see this clearly in the chart.  Plan to repeat a rapid HIV test here and send for PCR confirmation as needed.   Considered admission but patient with close infectious disease follow-up as well as ENT contact information for follow-up appointment.  Discussed tricked ED return precautions.   Disposition: discharge  ____________________________________________  FINAL CLINICAL IMPRESSION(S) / ED DIAGNOSES  Final diagnoses:  Neck swelling     NEW OUTPATIENT MEDICATIONS STARTED DURING THIS VISIT:  Discharge Medication List as of 07/23/2022  3:15 PM     START taking these medications   Details  bictegravir-emtricitabine-tenofovir AF (BIKTARVY) 50-200-25 MG TABS tablet Take 1 tablet by mouth daily., Starting Tue 07/23/2022, Normal        Note:  This document was prepared using Dragon voice recognition software and may include unintentional dictation errors.  Nanda Quinton, MD, [facility] Emergency Medicine    Norvel Wenker, [facility], MD 07/24/22 2229

## 2022-07-23 NOTE — ED Notes (Signed)
Pt to CT

## 2022-07-24 LAB — HIV-1 RNA QUANT-NO REFLEX-BLD
HIV 1 RNA Quant: 54600 copies/mL
LOG10 HIV-1 RNA: 4.737 log10copy/mL

## 2022-07-24 LAB — HIV-1/2 AB - DIFFERENTIATION
HIV 1 Ab: REACTIVE
HIV 2 Ab: UNDETERMINED

## 2022-07-24 LAB — T-HELPER CELLS (CD4) COUNT (NOT AT ARMC)
CD4 % Helper T Cell: 13 % — ABNORMAL LOW (ref 33–65)
CD4 T Cell Abs: 115 /uL — ABNORMAL LOW (ref 400–1790)

## 2022-07-24 LAB — RPR: RPR Ser Ql: NONREACTIVE

## 2022-07-24 LAB — PATHOLOGIST SMEAR REVIEW

## 2022-07-25 ENCOUNTER — Ambulatory Visit (INDEPENDENT_AMBULATORY_CARE_PROVIDER_SITE_OTHER): Payer: Self-pay | Admitting: Family

## 2022-07-25 ENCOUNTER — Ambulatory Visit: Payer: Self-pay

## 2022-07-25 ENCOUNTER — Ambulatory Visit (INDEPENDENT_AMBULATORY_CARE_PROVIDER_SITE_OTHER): Payer: Self-pay | Admitting: Pharmacist

## 2022-07-25 ENCOUNTER — Other Ambulatory Visit (HOSPITAL_COMMUNITY)
Admission: RE | Admit: 2022-07-25 | Discharge: 2022-07-25 | Disposition: A | Discharge status: Home / Self Care | Payer: Self-pay | Source: Ambulatory Visit | Attending: Family | Admitting: Family

## 2022-07-25 ENCOUNTER — Other Ambulatory Visit (HOSPITAL_COMMUNITY): Payer: Self-pay

## 2022-07-25 ENCOUNTER — Telehealth: Payer: Self-pay | Admitting: Internal Medicine

## 2022-07-25 ENCOUNTER — Other Ambulatory Visit: Payer: Self-pay

## 2022-07-25 ENCOUNTER — Encounter: Payer: Self-pay | Admitting: Family

## 2022-07-25 VITALS — BP 109/70 | HR 94 | Resp 16 | Ht 73.0 in | Wt 284.0 lb

## 2022-07-25 DIAGNOSIS — Z8619 Personal history of other infectious and parasitic diseases: Secondary | ICD-10-CM

## 2022-07-25 DIAGNOSIS — Z113 Encounter for screening for infections with a predominantly sexual mode of transmission: Secondary | ICD-10-CM

## 2022-07-25 DIAGNOSIS — B20 Human immunodeficiency virus [HIV] disease: Secondary | ICD-10-CM | POA: Insufficient documentation

## 2022-07-25 DIAGNOSIS — A749 Chlamydial infection, unspecified: Secondary | ICD-10-CM

## 2022-07-25 DIAGNOSIS — R221 Localized swelling, mass and lump, neck: Secondary | ICD-10-CM

## 2022-07-25 DIAGNOSIS — A549 Gonococcal infection, unspecified: Secondary | ICD-10-CM

## 2022-07-25 MED ORDER — AZITHROMYCIN 250 MG PO TABS
1000.0000 mg | ORAL_TABLET | Freq: Once | ORAL | Status: AC
  Administered 2022-07-25: 1000 mg via ORAL

## 2022-07-25 MED ORDER — SULFAMETHOXAZOLE-TRIMETHOPRIM 800-160 MG PO TABS: 1.0000 | ORAL_TABLET | Freq: Every day | ORAL | 0 refills | Status: DC

## 2022-07-25 MED ORDER — CEFTRIAXONE SODIUM 500 MG IJ SOLR
500.0000 mg | Freq: Once | INTRAMUSCULAR | Status: AC
  Administered 2022-07-25: 500 mg via INTRAMUSCULAR

## 2022-07-25 NOTE — Progress Notes (Signed)
HPI: Logan Ward is a 41 y.o. male who presents to the [facility] today to initiate care for a newly diagnosed HIV infection.  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  Previous Medications   AMOXICILLIN-CLAVULANATE (AUGMENTIN) 875-125 MG TABLET    Take 1 tablet by mouth every 12 (twelve) hours for 7 days.   BICTEGRAVIR-EMTRICITABINE-TENOFOVIR AF (BIKTARVY) 50-200-25 MG TABS TABLET    Take 1 tablet by mouth daily.  Modified Medications   No medications on file  Discontinued Medications   No medications on file    Allergies: No Known Allergies  Past Medical History: Past Medical History:  Diagnosis Date   Anal fissure    HIV infection (Logan Ward)    Internal hemorrhoids    Obesity    Rectal ulcer     Social History: Social History   Socioeconomic History   Marital status: Single    Spouse name: Not on file   Number of children: Not on file   Years of education: Not on file   Highest education level: Not on file  Occupational History   Not on file  Tobacco Use   Smoking status: Never   Smokeless tobacco: Never  Vaping Use   Vaping Use: Never used  Substance and Sexual Activity   Alcohol use: No   Drug use: No   Sexual activity: Not on file  Other Topics Concern   Not on file  Social History Narrative   Not on file   Social Determinants of Health   Financial Resource Strain: Not on file  Food Insecurity: Not on file  Transportation Needs: Not on file  Physical Activity: Not on file  Stress: Not on file  Social Connections: Not on file    Labs: Lab Results  Component Value Date   HIV1RNAQUANT 54,600 07/23/2022   CD4TABS 115 (L) 07/23/2022    RPR and STI Lab Results  Component Value Date   LABRPR NON REACTIVE 07/23/2022   LABRPR NON REACTIVE 12/09/2019     STI Results GC CT  06/07/2022  3:45 PM Negative  Negative   12/03/2021  8:13 AM Negative  Negative   11/19/2020  1:00 AM Negative  Positive     Hepatitis B No results found for: "HEPBSAB", "HEPBSAG", "HEPBCAB" Hepatitis C No results found for: "HEPCAB", "HCVRNAPCRQN" Hepatitis A No results found for: "HAV" Lipids: No results found for: "CHOL", "TRIG", "HDL", "CHOLHDL", "VLDL", "LDLCALC"  Current HIV Regimen: Treatment naive  Assessment: Maxten is here today to initiate care with Marya Amsler for his newly diagnosed HIV infection.  He is treatment naive with unknown initial HIV viral load which is pending. Fourth generation test returned positive for antibodies but negative for p24 antigen; differentiation test positive for HIV-1. CD4 count returned at 115 this week. Started on [place] while inpatient; starting Bactrim today. Patient uninsured and received free month of Biktarvy through [facility] while inpatient. Met with Deanna today for ADAP application.   Explained that Phillips Odor is a one pill once daily medication with or without food and the importance of not missing any doses. Explained resistance and how it develops and why it is so important to take Biktarvy daily and not skip days or doses. Counseled patient to take it around the same time each day. Counseled on what to do  if dose is missed, if closer to missed dose take immediately, if closer to next dose then skip and resume normal schedule. Cautioned on possible side effects the first week or so including nausea, diarrhea, dizziness, and headaches but that they should resolve after the first couple of weeks. I reviewed patient medications and found no drug interactions. Counseled patient to separate Biktarvy from divalent cations including multivitamins. Discussed with patient to call clinic if he starts a new medication or herbal supplement. I gave the patient my card and told him to call me with any  issues/questions/concerns.  Plan: Continue Biktarvy  Counseled patient on [place], PharmD, CPP, [place] Clinical Pharmacist Practitioner Infectious Diseases [place] for Infectious Disease 07/25/2022, 4:10 PM

## 2022-07-25 NOTE — Telephone Encounter (Signed)
error 

## 2022-07-25 NOTE — Progress Notes (Signed)
Brief Narrative   Patient ID: Logan Ward, male    DOB: 14-May-1981, 41 y.o.   MRN: [id number]  Logan Ward is a 41 y/o AA male diagnosed with HIV disease in December 2020 and starting care after ED visit on 07/25/22 with initial viral load of 54,600 and CD4 count of 115. Risk factor for HIV acquisition is bisexual contact.No history of opportunistic infection. Enters care at [facility] Stage 3. Initial lab work include Genotype are in progress. Started on Biktarvy and Bactrim.   Subjective:    Chief Complaint  Patient presents with   HIV Positive/AIDS    HPI:  Logan Ward is a 41 y.o. male with history of chlamydia and syphilis  presenting today for newly diagnosed HIV disease.  Logan Ward was initially seen in the [facility] on 07/19/22 with swelling of his neck and prescribed Augmentin for possible bacterial source. Recommended to see ENT however ended up in the ED at [facility] with worsening symptoms despite Augmentin. CT neck with contrast with non-specific infectious or inflammatory process of the right neck and submandibular region with no evidence of abscess; and nonsuppurative lymph node enlargement believed to be reactive. Recommendation was to continue Augmentin and follow up with ENT. As part of the lab work a rapid HIV test was reactive with HIV 1/2 antibodies. Previous blood work completed on 12/09/19 with positive 4th generation test and positive HIV-1 antibody. Referred to [facility] at the time in attempts to establish care. ED lab work on 07/23/22 with RNA level of 54,600 and CD4 count 115. Noted to have positive RPR titer at Urgent Care in May of 2022 and treated with 7 days of doxycycline and also had received 2.4 million units Bicillin. Current RPR titer is non-reactive. Risk factor for HIV is bisexual activity. He was started on Biktarvy with follow up at [facility].   Logan Ward has started his Biktarvy and has had no adverse side effects. Feeling better today. Has concerns about the  swelling in his neck and is taking the Augmentin as prescribed. Denies fevers, chills, night sweats, headaches, changes in vision, neck pain/stiffness, nausea, diarrhea, vomiting, lesions or rashes. Does not recall his last negative HIV test prior to December 2020 although believes testing in October/November 2022 was negative. Risk factor for HIV is bisexual contact. Works full time as a Marine scientist and has stable housing and good access to food. Has applied for financial assistance with [employer] and [employer]. Also has [employer] [facility] for 30 days and was given a 30 day supply of medication yesterday in the ED. Has questions about medications and the plan of care. No current recreational or illicit drug use, tobacco use or alcohol consumption. Condoms offered.   No Known Allergies    Outpatient Medications Prior to Visit  Medication Sig Dispense Refill   amoxicillin-clavulanate (AUGMENTIN) 875-125 MG tablet Take 1 tablet by mouth every 12 (twelve) hours for 7 days. 14 tablet 0   bictegravir-emtricitabine-tenofovir AF (BIKTARVY) 50-200-25 MG TABS tablet Take 1 tablet by mouth daily. 30 tablet 0   No facility-administered medications prior to visit.     Past Medical History:  Diagnosis Date   Anal fissure    HIV infection ([place])    Internal hemorrhoids    Obesity    Rectal ulcer      History reviewed. No pertinent surgical history.    Review of Systems  Constitutional:  Negative for appetite change, chills, fatigue, fever and unexpected weight  change.  Eyes:  Negative for visual disturbance.  Respiratory:  Negative for cough, chest tightness, shortness of breath and wheezing.   Cardiovascular:  Negative for chest pain and leg swelling.  Gastrointestinal:  Negative for abdominal pain, constipation, diarrhea, nausea and vomiting.  Genitourinary:  Negative for dysuria, flank pain, frequency, genital sores, hematuria and urgency.  Skin:  Negative for rash.   Allergic/Immunologic: Negative for immunocompromised state.  Neurological:  Negative for dizziness and headaches.      Objective:    BP 109/70   Pulse 94   Resp 16   Ht 6' 1"  (1.854 m)   Wt 284 lb (128.8 kg)   SpO2 99%   BMI 37.47 kg/m  Nursing note and vital signs reviewed.  Physical Exam Constitutional:      General: He is not in acute distress.    Appearance: He is well-developed.  Eyes:     Conjunctiva/sclera: Conjunctivae normal.  Cardiovascular:     Rate and Rhythm: Normal rate and regular rhythm.     Heart sounds: Normal heart sounds. No murmur heard.    No friction rub. No gallop.  Pulmonary:     Effort: Pulmonary effort is normal. No respiratory distress.     Breath sounds: Normal breath sounds. No wheezing or rales.  Chest:     Chest wall: No tenderness.  Abdominal:     General: Bowel sounds are normal.     Palpations: Abdomen is soft.     Tenderness: There is no abdominal tenderness.  Musculoskeletal:     Cervical back: Neck supple.  Lymphadenopathy:     Cervical: No cervical adenopathy.  Skin:    General: Skin is warm and dry.     Findings: No rash.  Neurological:     Mental Status: He is alert and oriented to person, place, and time.  Psychiatric:        Behavior: Behavior normal.        Thought Content: Thought content normal.        Judgment: Judgment normal.         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)    Logan Ward a 41 y/o AA male diagnosed with HIV disease in December 2020 and starting care after ED visit on 07/25/22 with initial  viral load of 54,600 and CD4 count of 115. Risk factor for HIV acquisition is bisexual contact. Reviewed lab work and discussed plan of care and medications. Check lab work today including HIV Genotype. Continue current dose of Biktarvy. Financial assistance applied for. Add Bactrim for OI prophylaxis as he is at risk for opportunistic infection and currently classified as AIDS entering care at [name] Stage 3. Met with pharmacy and clinic orientation provided. Plan for follow up in 1 month or sooner if needed.       Relevant Medications   sulfamethoxazole-trimethoprim (BACTRIM DS) 800-160 MG tablet   Other Relevant Orders   HIV RNA, RTPCR W/R GT (RTI, PI,INT)   HLA B*[id number]  Screening for STDs (sexually transmitted diseases) - Primary    Screening for STD for gonorrhea and chlamydia and requesting treatment. Given 500 mg Ceftriaxone IM once for gonorrhea and 1 gram Azithromycin for chlamydia.       Relevant Orders   Urine cytology ancillary only   Cytology (oral, anal, urethral) ancillary only   Cytology (oral, anal, urethral) ancillary only   History of syphilis    RPR completed on 07/23/22 was non-reactive. Previous titer of 1:16 and was treated with 2.4 million units Bicillin and doxycycline. No indications at present for additional treatment at this time.       Neck swelling    Possibly AIDS/HIV related versus infectious. Currently on Augmentin and will follow closely given his risk for opportunistic infection. May need additional imaging or biopsy if symptoms worsen or do not improve.       Other Visit Diagnoses     Gonorrhea       Relevant Medications   sulfamethoxazole-trimethoprim (BACTRIM DS) 800-160 MG tablet   cefTRIAXone (ROCEPHIN) injection 500 mg (Completed)   azithromycin (ZITHROMAX) tablet 1,000 mg (Completed)   Chlamydia       Relevant Medications   sulfamethoxazole-trimethoprim (BACTRIM DS) 800-160 MG tablet   cefTRIAXone (ROCEPHIN) injection 500 mg (Completed)    azithromycin (ZITHROMAX) tablet 1,000 mg (Completed)        I am having Logan Ward start on sulfamethoxazole-trimethoprim. I am also having him maintain his bictegravir-emtricitabine-tenofovir AF and amoxicillin-clavulanate. We administered cefTRIAXone and azithromycin.   Meds ordered this encounter  Medications   sulfamethoxazole-trimethoprim (BACTRIM DS) 800-160 MG tablet    Sig: Take 1 tablet by mouth daily.    Dispense:  28 tablet    Refill:  0    Has GoodRx card / coupon    Order Specific Question:   Supervising Provider    Answer:   Carlyle Basques [[id number]]   cefTRIAXone (ROCEPHIN) injection 500 mg   azithromycin (ZITHROMAX) tablet 1,000 mg     Follow-up: Return in about 1 month (around 08/25/2022), or if symptoms worsen or fail to improve.   Terri Piedra, MSN, FNP-C Nurse Practitioner [facility] for Infectious Disease [place] number: [phone]

## 2022-07-25 NOTE — Patient Instructions (Signed)
Nice to see you.  We will check your swabs today.   Continue to take your medication daily as prescribed.  Refills have been sent to the pharmacy. [facility] on [address] for follow up in 1 months or sooner if needed with lab work on the same day.  Have a great day and stay safe!

## 2022-07-26 DIAGNOSIS — R221 Localized swelling, mass and lump, neck: Secondary | ICD-10-CM | POA: Insufficient documentation

## 2022-07-26 DIAGNOSIS — Z7189 Other specified counseling: Secondary | ICD-10-CM | POA: Insufficient documentation

## 2022-07-26 DIAGNOSIS — Z8619 Personal history of other infectious and parasitic diseases: Secondary | ICD-10-CM | POA: Insufficient documentation

## 2022-07-26 DIAGNOSIS — Z113 Encounter for screening for infections with a predominantly sexual mode of transmission: Secondary | ICD-10-CM | POA: Insufficient documentation

## 2022-07-26 LAB — URINE CYTOLOGY ANCILLARY ONLY
Chlamydia: NEGATIVE
Comment: NEGATIVE
Comment: NORMAL
Neisseria Gonorrhea: NEGATIVE

## 2022-07-26 LAB — CYTOLOGY, (ORAL, ANAL, URETHRAL) ANCILLARY ONLY
Chlamydia: NEGATIVE
Chlamydia: NEGATIVE
Comment: NEGATIVE
Comment: NEGATIVE
Comment: NORMAL
Comment: NORMAL
Neisseria Gonorrhea: NEGATIVE
Neisseria Gonorrhea: NEGATIVE

## 2022-07-26 NOTE — Assessment & Plan Note (Addendum)
Mr. Hadden a 41 y/o AA male diagnosed with HIV disease in December 2020 and starting care after ED visit on 07/25/22 with initial viral load of 54,600 and CD4 count of 115. Risk factor for HIV acquisition is bisexual contact. Reviewed lab work and discussed plan of care and medications. Check lab work today including HIV Genotype. Continue current dose of Biktarvy. Financial assistance applied for. Add Bactrim for OI prophylaxis as he is at risk for opportunistic infection and currently classified as AIDS entering care at [facility] Stage 3. Met with pharmacy and clinic orientation provided. Plan for follow up in 1 month or sooner if needed.

## 2022-07-26 NOTE — Assessment & Plan Note (Signed)
RPR completed on 07/23/22 was non-reactive. Previous titer of 1:16 and was treated with 2.4 million units Bicillin and doxycycline. No indications at present for additional treatment at this time.

## 2022-07-26 NOTE — Assessment & Plan Note (Signed)
Screening for STD for gonorrhea and chlamydia and requesting treatment. Given 500 mg Ceftriaxone IM once for gonorrhea and 1 gram Azithromycin for chlamydia.

## 2022-07-26 NOTE — Assessment & Plan Note (Signed)
Possibly AIDS/HIV related versus infectious. Currently on Augmentin and will follow closely given his risk for opportunistic infection. May need additional imaging or biopsy if symptoms worsen or do not improve.

## 2022-07-29 ENCOUNTER — Telehealth: Payer: Self-pay

## 2022-07-29 NOTE — Telephone Encounter (Signed)
I attempted to contact patient to relay lab results. Patient did not answer and no secured voicemail setup.  Logan Ward

## 2022-07-29 NOTE — Telephone Encounter (Signed)
Patient called back regarding missed call. Relayed result information. Did not have any questions at this time. Leatrice Jewels, RMA

## 2022-07-29 NOTE — Telephone Encounter (Signed)
-----   Message from [place], Normanna sent at 07/29/2022  8:57 AM EDT ----- Please inform Mr. Doubleday that his STD testing is negative. His viral load is already starting to come down as well which is good. Continue with Biktarvy and Bactrim.

## 2022-08-07 LAB — HIV-1 GENOTYPE: HIV-1 Genotype: DETECTED — AB

## 2022-08-07 LAB — HIV RNA, RTPCR W/R GT (RTI, PI,INT)
HIV 1 RNA Quant: 9320 copies/mL — ABNORMAL HIGH
HIV-1 RNA Quant, Log: 3.97 Log copies/mL — ABNORMAL HIGH

## 2022-08-07 LAB — HIV-1 INTEGRASE GENOTYPE

## 2022-08-07 LAB — HLA B*5701: HLA-B*5701 w/rflx HLA-B High: NEGATIVE

## 2022-08-21 ENCOUNTER — Encounter: Payer: Self-pay | Admitting: Family

## 2022-08-28 ENCOUNTER — Ambulatory Visit (INDEPENDENT_AMBULATORY_CARE_PROVIDER_SITE_OTHER): Payer: 59 | Admitting: Family

## 2022-08-28 ENCOUNTER — Encounter: Payer: Self-pay | Admitting: Family

## 2022-08-28 ENCOUNTER — Other Ambulatory Visit: Payer: Self-pay

## 2022-08-28 ENCOUNTER — Other Ambulatory Visit (HOSPITAL_COMMUNITY): Payer: Self-pay

## 2022-08-28 VITALS — BP 130/75 | HR 87 | Temp 98.5°F | Ht 73.0 in | Wt 292.0 lb

## 2022-08-28 DIAGNOSIS — B20 Human immunodeficiency virus [HIV] disease: Secondary | ICD-10-CM

## 2022-08-28 DIAGNOSIS — R221 Localized swelling, mass and lump, neck: Secondary | ICD-10-CM | POA: Diagnosis not present

## 2022-08-28 MED ORDER — SULFAMETHOXAZOLE-TRIMETHOPRIM 800-160 MG PO TABS: 1.0000 | ORAL_TABLET | Freq: Every day | ORAL | 2 refills | Status: DC

## 2022-08-28 NOTE — Assessment & Plan Note (Addendum)
Logan Ward has good adherence and tolerance to Biktarvy supplemented with Bactrim for OI prophylaxis.  Reviewed previous lab work and discussed plan of care.  Reviewed that undetectable equals un-transmittable and the need to continue Bactrim for at least 3 months from the time his CD4 count is above 200 or his viral load remains undetectable and CD4 count greater than 100.  Check lab work today.  Complete financial assistance.  Continue current dose of Biktarvy and Bactrim.  Plan for follow-up in 1 month or sooner if needed with lab work on the same day.

## 2022-08-28 NOTE — Assessment & Plan Note (Signed)
Neck is significantly improved and treated for cellulitis with broad-spectrum coverage with moxifloxacin and linezolid.  Complete antibiotics as prescribed with no additional antibiotic needed at this time.

## 2022-08-28 NOTE — Patient Instructions (Signed)
Nice to see you.  We will check your lab work today.  Continue to take your medication as prescribed.   Plan for follow up in 1 month or sooner if needed.

## 2022-08-28 NOTE — Progress Notes (Signed)
Brief Narrative   Patient ID: Logan Ward, male    DOB: September 01, 1981, 41 y.o.   MRN: [id number]  Mr. Scheff is a 41 y/o AA male diagnosed with HIV disease in December 2020 and starting care after ED visit on 07/25/22 with initial viral load of 54,600 and CD4 count of 115. Risk factor for HIV acquisition is bisexual contact.No history of opportunistic infection. Enters care at [facility] Stage 3. Initial lab work include Genotype are in progress. Started on Biktarvy and Bactrim.   Subjective:    Chief Complaint  Patient presents with   Follow-up    HPI:  Logan Ward is a 41 y.o. male with HIV/AIDS last seen on 07/25/2022 for new patient appointment and to establish care for HIV.  Initial viral load was from 54,600 with CD4 count of 115.  Started on Biktarvy and Bactrim.  Was also having continued neck swelling with antibiotics prescribed for cellulitis with Augmentin.  Subsequently hospitalized on 08/05/2022 with cellulitis of the neck with antibiotic coverage broadened to vancomycin and Zosyn.  He was discharged on linezolid and moxifloxacin with end date of 9/11.  Here today for routine follow-up.  Mr. Dorner has continued to take his moxifloxacin and linezolid as prescribed and has 1 dose remaining.  He has also been taking his Biktarvy as prescribed with no significant adverse side effects.  Awaiting financial assistance approval as additional information is needed.  Neck is feeling better today.  Has concerns about obtaining medication. Denies fevers, chills, night sweats, headaches, changes in vision, neck pain/stiffness, nausea, diarrhea, vomiting, lesions or rashes.  Ability to obtain medication has him depressed as well as the bills he has been receiving from the hospital.    No Known Allergies    Outpatient Medications Prior to Visit  Medication Sig Dispense Refill   bictegravir-emtricitabine-tenofovir AF (BIKTARVY) 50-200-25 MG TABS tablet Take 1 tablet by mouth daily. 30 tablet 0    sulfamethoxazole-trimethoprim (BACTRIM DS) 800-160 MG tablet Take 1 tablet by mouth daily. 28 tablet 0   No facility-administered medications prior to visit.     Past Medical History:  Diagnosis Date   Anal fissure    HIV infection ([place])    Internal hemorrhoids    Obesity    Rectal ulcer      History reviewed. No pertinent surgical history.    Review of Systems  Constitutional:  Negative for appetite change, chills, fatigue, fever and unexpected weight change.  Eyes:  Negative for visual disturbance.  Respiratory:  Negative for cough, chest tightness, shortness of breath and wheezing.   Cardiovascular:  Negative for chest pain and leg swelling.  Gastrointestinal:  Negative for abdominal pain, constipation, diarrhea, nausea and vomiting.  Genitourinary:  Negative for dysuria, flank pain, frequency, genital sores, hematuria and urgency.  Skin:  Negative for rash.  Allergic/Immunologic: Negative for immunocompromised state.  Neurological:  Negative for dizziness and headaches.      Objective:    BP 130/75   Pulse 87   Temp 98.5 F (36.9 C) (Oral)   Ht '6\' 1"'$  (1.854 m)   Wt 292 lb (132.5 kg)   BMI 38.52 kg/m  Nursing note and vital signs reviewed.  Physical Exam Constitutional:      General: He is not in acute distress.    Appearance: He is well-developed.  Eyes:     Conjunctiva/sclera: Conjunctivae normal.  Cardiovascular:     Rate and Rhythm: Normal rate and regular rhythm.     Heart  sounds: Normal heart sounds. No murmur heard.    No friction rub. No gallop.  Pulmonary:     Effort: Pulmonary effort is normal. No respiratory distress.     Breath sounds: Normal breath sounds. No wheezing or rales.  Chest:     Chest wall: No tenderness.  Abdominal:     General: Bowel sounds are normal.     Palpations: Abdomen is soft.     Tenderness: There is no abdominal tenderness.  Musculoskeletal:     Cervical back: Neck supple.  Lymphadenopathy:     Cervical: No  cervical adenopathy.  Skin:    General: Skin is warm and dry.     Findings: No rash.  Neurological:     Mental Status: He is alert and oriented to person, place, and time.  Psychiatric:        Behavior: Behavior normal.        Thought Content: Thought content normal.        Judgment: Judgment normal.         08/28/2022   11:04 AM 07/25/2022    2:37 PM  Depression screen PHQ 2/9  Decreased Interest 0 0  Down, Depressed, Hopeless 1 0  PHQ - 2 Score 1 0       Assessment & Plan:    Patient Active Problem List   Diagnosis Date Noted   Screening for STDs (sexually transmitted diseases) 07/26/2022   History of syphilis 07/26/2022   Neck swelling 07/26/2022   AIDS (acquired immune deficiency syndrome) ([place]) 07/25/2022     Problem List Items Addressed This Visit       Other   AIDS (acquired immune deficiency syndrome) ([place]) - Primary    Mr. Petit has good adherence and tolerance to [employer] supplemented with Bactrim for OI prophylaxis.  Reviewed previous lab work and discussed plan of care.  Reviewed that undetectable equals un-transmittable and the need to continue Bactrim for at least 3 months from the time his CD4 count is above 200 or his viral load remains undetectable and CD4 count greater than 100.  Check lab work today.  Complete financial assistance.  Continue current dose of Biktarvy and Bactrim.  Plan for follow-up in 1 month or sooner if needed with lab work on the same day.      Relevant Medications   sulfamethoxazole-trimethoprim (BACTRIM DS) 800-160 MG tablet   Other Relevant Orders   HIV-1 RNA quant-no reflex-bld   T-helper cell (CD4)- ([facility] clinic only)   Neck swelling    Neck is significantly improved and treated for cellulitis with broad-spectrum coverage with moxifloxacin and linezolid.  Complete antibiotics as prescribed with no additional antibiotic needed at this time.        I am having Semir L. Meadow maintain his  bictegravir-emtricitabine-tenofovir AF and sulfamethoxazole-trimethoprim.   Meds ordered this encounter  Medications   sulfamethoxazole-trimethoprim (BACTRIM DS) 800-160 MG tablet    Sig: Take 1 tablet by mouth daily.    Dispense:  30 tablet    Refill:  2    Has GoodRx card / coupon    Order Specific Question:   Supervising Provider    Answer:   Carlyle Basques [[id number]]     Follow-up: Return in about 1 month (around 09/27/2022), or if symptoms worsen or fail to improve.   Terri Piedra, MSN, FNP-C Nurse Practitioner [facility] for Infectious Disease [place] number: [phone]

## 2022-08-29 ENCOUNTER — Ambulatory Visit: Payer: Self-pay | Admitting: Family

## 2022-08-29 ENCOUNTER — Other Ambulatory Visit: Payer: Self-pay | Admitting: Pharmacist

## 2022-08-29 DIAGNOSIS — B20 Human immunodeficiency virus [HIV] disease: Secondary | ICD-10-CM

## 2022-08-29 LAB — T-HELPER CELL (CD4) - (RCID CLINIC ONLY)
CD4 % Helper T Cell: 19 % — ABNORMAL LOW (ref 33–65)
CD4 T Cell Abs: 162 /uL — ABNORMAL LOW (ref 400–1790)

## 2022-08-29 MED ORDER — BIKTARVY 50-200-25 MG PO TABS: 1.0000 | ORAL_TABLET | Freq: Every day | ORAL | 0 refills | Status: DC

## 2022-08-29 NOTE — Progress Notes (Signed)
Medication Samples have been provided to the patient.  Drug name: Biktarvy        Strength: 50/200/25 mg       Qty: 4 bottles (28 tablets)   LOT: [place]   Exp.Date: 08/2024  Dosing instructions: Take one tablet by mouth once daily  The patient has been instructed regarding the correct time, dose, and frequency of taking this medication, including desired effects and most common side effects.   Matei Magnone L. Eber Hong, PharmD, BCIDP, AAHIVP, CPP Clinical Pharmacist Practitioner Infectious Diseases [place] for Infectious Disease 11/27/2020, 10:07 AM

## 2022-08-30 LAB — HIV-1 RNA QUANT-NO REFLEX-BLD
HIV 1 RNA Quant: <20 Copies/mL — ABNORMAL HIGH
HIV-1 RNA Quant, Log: <1.3 Log cps/mL — ABNORMAL HIGH

## 2022-09-03 ENCOUNTER — Telehealth: Payer: Self-pay

## 2022-09-03 NOTE — Telephone Encounter (Signed)
Spoke with [place], he will come in Thursday afternoon 9/21 to see [name], NP.   Beryle Flock, RN

## 2022-09-03 NOTE — Telephone Encounter (Signed)
Spoke with Logan Ward, relayed that his viral load is undetectable and CD4 count has improved. Advised him to continue medications as prescribed.   He expresses concern over neck/chest swelling that was an issue at a previous appointment. States he does not have any involvement with his throat this time, but does have some swelling in his chest area. Denies shortness of breath, difficulty breathing, or chest pain. Recommended he go to the emergency department if these symptoms develop.   He would like to know if he needs to come in to see Marya Amsler sooner. Will route to provider.   Beryle Flock, RN

## 2022-09-03 NOTE — Telephone Encounter (Signed)
-----   Message from [place], [place] sent at 09/03/2022 12:33 PM EDT ----- Please inform Mr. Vanderloop that his viral load is undetectable and CD4 count is improved to 162. Continue with medications.

## 2022-09-05 ENCOUNTER — Encounter: Payer: Self-pay | Admitting: Infectious Diseases

## 2022-09-05 ENCOUNTER — Telehealth: Payer: Self-pay

## 2022-09-05 ENCOUNTER — Other Ambulatory Visit: Payer: Self-pay

## 2022-09-05 ENCOUNTER — Ambulatory Visit (INDEPENDENT_AMBULATORY_CARE_PROVIDER_SITE_OTHER): Payer: Self-pay | Admitting: Infectious Diseases

## 2022-09-05 VITALS — BP 110/40 | HR 77 | Temp 97.8°F | Wt 292.0 lb

## 2022-09-05 DIAGNOSIS — B20 Human immunodeficiency virus [HIV] disease: Secondary | ICD-10-CM

## 2022-09-05 DIAGNOSIS — R590 Localized enlarged lymph nodes: Secondary | ICD-10-CM

## 2022-09-05 MED ORDER — AMOXICILLIN-POT CLAVULANATE 875-125 MG PO TABS: 1.0000 | ORAL_TABLET | Freq: Two times a day (BID) | ORAL | 0 refills | Status: AC

## 2022-09-05 MED ORDER — BIKTARVY 50-200-25 MG PO TABS: 1.0000 | ORAL_TABLET | Freq: Every day | ORAL | 5 refills | Status: DC

## 2022-09-05 MED ORDER — SULFAMETHOXAZOLE-TRIMETHOPRIM 800-160 MG PO TABS: 1.0000 | ORAL_TABLET | Freq: Every day | ORAL | 5 refills | Status: DC

## 2022-09-05 NOTE — Progress Notes (Signed)
Name: Logan Ward  DOB: 1981/10/03 MRN: [id number] PCP: Pcp, No    Subjective:   Chief Complaint  Patient presents with   Follow-up     HPI: Rodolphe is here for follow up regarding progressive swelling and firmness of the manubrium and left chest. This was evident to a lesser degree over the course if this problem (6w) but feels that "this drained and got bigger now since stopping antibiotics."   LOV with Marya Amsler 08/28/2022. Dx with HIV in 2020, AIDS(+). Last VL in Aug 2023 54,600 and CD4 115. He was started on Bactrim and Biktarvy for prophylaxis and treatment.   He has had trouble with neck swelling, which is the reason he wanted appointment today.  Hospitalized 08/05/2022 at [facility] in [place] with cellulitis; started on vanc/zosyn and D/C on linezolid and moxifloxacin through 9/11. CT scan showed extensive soft tissue swelling compatible with cellulitis and myositis. ENT recommended outpatient FU (this has not been scheduled). ID saw and felt like source was odontogenic - dentistry was consulted. Panorex w/o any acute findings. This has been an ongoing problem for him since early August of this year.   Has had worsening in swelling overlying chest under clavicle area. Large, full and feels tight at times. Has not had any fevers/chills or night sweats. No weight loss. No pain over any of the areas described. Has not had any follow up with ENT. Scheduled visit with dentistry soon through [facility].  Taking only biktarvy and bactrim prophy.       08/28/2022   11:04 AM  Depression screen PHQ 2/9  Decreased Interest 0  Down, Depressed, Hopeless 1  PHQ - 2 Score 1    Review of Systems  Constitutional:  Negative for chills, fever, malaise/fatigue and weight loss.  Respiratory: Negative.    Cardiovascular: Negative.   Gastrointestinal: Negative.   Musculoskeletal: Negative.   Skin:  Positive for rash (increased papular rash to face and chest.).    Past Medical History:   Diagnosis Date   Anal fissure    HIV infection ([place])    Internal hemorrhoids    Obesity    Rectal ulcer     Outpatient Medications Prior to Visit  Medication Sig Dispense Refill   bictegravir-emtricitabine-tenofovir AF (BIKTARVY) 50-200-25 MG TABS tablet Take 1 tablet by mouth daily for 28 days. 28 tablet 0   sulfamethoxazole-trimethoprim (BACTRIM DS) 800-160 MG tablet Take 1 tablet by mouth daily. 30 tablet 2   No facility-administered medications prior to visit.     No Known Allergies  Social History   Tobacco Use   Smoking status: Never   Smokeless tobacco: Never  Vaping Use   Vaping Use: Never used  Substance Use Topics   Alcohol use: No   Drug use: No    Family History  Problem Relation Age of Onset   Hypertension Father    Throat cancer Father    Heart attack Brother    Colon cancer Neg Hx    Esophageal cancer Neg Hx    Stomach cancer Neg Hx     Social History   Substance and Sexual Activity  Sexual Activity Yes   Comment: given condoms     Objective:   Vitals:   09/05/22 1522  BP: (!) 110/40  Pulse: 77  Temp: 97.8 F (36.6 C)  TempSrc: Oral  SpO2: 99%  Weight: 292 lb (132.5 kg)   Body mass index is 38.52 kg/m.  Physical Exam Neck:  Comments: 2-3 cm mobile mass noted under the chin. Nontender. No erythema noted to surrounding skin.  Cardiovascular:     Rate and Rhythm: Normal rate.  Chest:       Comments: Large irregularly shaped area of fullness overlying manubrium, fuller on the left anterior boarder.  Musculoskeletal:     Cervical back: Normal range of motion. No rigidity or tenderness.  Lymphadenopathy:     Head:     Left side of head: No submandibular adenopathy.            Lab Results Lab Results  Component Value Date   WBC 2.1 (L) 07/23/2022   HGB 13.6 07/23/2022   HCT 42.7 07/23/2022   MCV 88.6 07/23/2022   PLT 160 07/23/2022    Lab Results  Component Value Date   CREATININE 0.99 07/23/2022   BUN 11  07/23/2022   NA 139 07/23/2022   K 4.2 07/23/2022   CL 109 07/23/2022   CO2 25 07/23/2022    Lab Results  Component Value Date   ALT 47 12/03/2017   AST 62 (H) 12/03/2017   ALKPHOS 69 12/03/2017   BILITOT 1.6 (H) 12/03/2017    No results found for: "CHOL", "HDL", "LDLCALC", "LDLDIRECT", "TRIG", "CHOLHDL" HIV 1 RNA Quant  Date Value  08/28/2022 <20 Copies/mL (H)  07/25/2022 9,320 copies/mL (H)  07/23/2022 54,600 copies/mL   CD4 T Cell Abs (/uL)  Date Value  08/28/2022 162 (L)  07/23/2022 115 (L)     Assessment & Plan:   Problem List Items Addressed This Visit       Unprioritized   Submental lymphadenopathy - Primary    Persistent ~3cm submental mass - presumably lymph node. No overlying cellulitis. Would have expected this to have some improvement given antibiotics for deep head/neck infection. Will proceed with IR eval for FNA for biopsy of the mass - pathology and AFB stains (doubt NTM but maybe...) would be helpful.   Unusual irregular fullness/firmness that spans about 6-7 cm as pictured across manubrium down the left lateral sternum. Non-tender. No overlying cellulitis.   Will arrange repeat contrasted soft tissue h/n CT scan to follow for changes.   Keep appt with Marya Amsler as scheduled in 2-3 weeks to follow up results.       Relevant Orders   CT SOFT TISSUE NECK W CONTRAST   IR Radiologist Eval & Mgmt   AIDS (acquired immune deficiency syndrome) ([place])    Reviewed most recent labs - increase in CD4 to 163, VL < 20 on Biktarvy. Continue Bactrim prophy as prescribed. New orders sent in to [facility] for refills and educated about new pharmacy info.   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 Topical lotions/steroids and oral antihistamines PRN.        Relevant Medications   bictegravir-emtricitabine-tenofovir AF (BIKTARVY) 50-200-25 MG TABS tablet    sulfamethoxazole-trimethoprim (BACTRIM DS) 800-160 MG tablet   Other Visit Diagnoses     HIV disease (HCC)       Relevant Medications   bictegravir-emtricitabine-tenofovir AF (BIKTARVY) 50-200-25 MG TABS tablet   sulfamethoxazole-trimethoprim (BACTRIM DS) 800-160 MG tablet      Total encounter time: 45 min including face to face time, record review and time spent personally discussing with IR regarding testing moving forward.   [name], MSN, NP-C [facility] for Infectious [place] Pager: [phone] Office: [phone]  09/05/22  4:27 PM

## 2022-09-05 NOTE — Telephone Encounter (Signed)
Per [name], Np reached out to IR to schedule appointment for Biopsy of Thyroid.  Patient would like appointment any day after 3 pm. Would like to have this done before appointment with Elna Breslow, [place]. Will update patient once appointment is scheduled.   Leatrice Jewels, RMA

## 2022-09-05 NOTE — Patient Instructions (Signed)
Will get imaging scheduled to evaluate for changes of the area - I think an MRI may be more helpful to better view the structures.   Will discuss with radiology about possible needle biopsy of your lump under the chin; may need to refer you to ENT for this.   Please go to the dentist as scheduled.   Will send in an antibiotic to have if you develop any pain, chills/sweating/fevers, redness over the chest.   Please come back as scheduled to see Marya Amsler

## 2022-09-05 NOTE — Assessment & Plan Note (Signed)
Persistent ~3cm submental mass - presumably lymph node. No overlying cellulitis. Would have expected this to have some improvement given antibiotics for deep head/neck infection. Will proceed with IR eval for FNA for biopsy of the mass - pathology and AFB stains (doubt NTM but maybe...) would be helpful.   Unusual irregular fullness/firmness that spans about 6-7 cm as pictured across manubrium down the left lateral sternum. Non-tender. No overlying cellulitis.   Will arrange repeat contrasted soft tissue h/n CT scan to follow for changes.   Keep appt with Logan Ward as scheduled in 2-3 weeks to follow up results.

## 2022-09-05 NOTE — Assessment & Plan Note (Signed)
Reviewed most recent labs - increase in CD4 to 163, VL < 20 on Biktarvy. Continue Bactrim prophy as prescribed. New orders sent in to [facility] for refills and educated about new pharmacy info.   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 Topical lotions/steroids and oral antihistamines PRN.

## 2022-09-06 ENCOUNTER — Telehealth (HOSPITAL_COMMUNITY): Payer: Self-pay | Admitting: Radiology

## 2022-09-06 NOTE — Telephone Encounter (Signed)
Received update for Interventional  Radiology that request for biopsy has been approved. Patient will be contacted to schedule appointment. Can call [phone] if he would like to call and schedule.  Patient made aware.  Leatrice Jewels, RMA

## 2022-09-06 NOTE — Telephone Encounter (Signed)
Request for pt to have biopsy. Reviewed and approved by Dr. Ruthann Cancer for right submandibular mass biopsy US guided. JM

## 2022-09-11 ENCOUNTER — Other Ambulatory Visit (HOSPITAL_COMMUNITY): Payer: Self-pay | Admitting: Family Medicine

## 2022-09-11 ENCOUNTER — Ambulatory Visit (HOSPITAL_COMMUNITY): Payer: 59

## 2022-09-11 ENCOUNTER — Telehealth: Payer: Self-pay

## 2022-09-11 NOTE — Telephone Encounter (Signed)
Error

## 2022-09-11 NOTE — Telephone Encounter (Signed)
Received call from Chrystine Oiler [phone]) in Interventional Radiology, requesting clarification from [name] on biopsy sample request.  Message sent to provider; provider confirmed she discussed with him today.  Binnie Kand, RN

## 2022-09-27 ENCOUNTER — Ambulatory Visit: Payer: 59 | Admitting: Family

## 2022-09-27 NOTE — Progress Notes (Deleted)
    Brief Narrative   Patient ID: Logan Ward, male    DOB: [birth year], 41 y.o.   MRN: [id number]  Mr. Golubski is a 41 y/o AA male diagnosed with HIV disease in December 2020 and starting care after ED visit on 07/25/22 with initial viral load of 54,600 and CD4 count of 115. Risk factor for HIV acquisition is bisexual contact.No history of opportunistic infection. Enters care at [facility] Stage 3. Initial lab work include Genotype are in progress. Started on Biktarvy and Bactrim.  Subjective:    No chief complaint on file.   HPI:  Logan Ward is a 41 y.o. male with HIV disease last seen on 09/05/2022 by [name], NP with concern for continued submental lymphadenopathy.  Follow-up CT scan and referral to IR were ordered and currently on hold as he was having improvement.  Last lab work with viral load that was undetectable and CD4 count of 162.  Here today for follow-up.   No Known Allergies    Outpatient Medications Prior to Visit  Medication Sig Dispense Refill   amoxicillin-clavulanate (AUGMENTIN) 875-125 MG tablet Take 1 tablet by mouth 2 (two) times daily for 10 days. 20 tablet 0   bictegravir-emtricitabine-tenofovir AF (BIKTARVY) 50-200-25 MG TABS tablet Take 1 tablet by mouth daily. 30 tablet 5   sulfamethoxazole-trimethoprim (BACTRIM DS) 800-160 MG tablet Take 1 tablet by mouth daily. 30 tablet 5   No facility-administered medications prior to visit.     Past Medical History:  Diagnosis Date   Anal fissure    HIV infection ([place])    Internal hemorrhoids    Obesity    Rectal ulcer      No past surgical history on file.    Review of Systems    Objective:    There were no vitals taken for this visit. Nursing note and vital signs reviewed.  Physical Exam      08/28/2022   11:04 AM 07/25/2022    2:37 PM  Depression screen PHQ 2/9  Decreased Interest 0 0  Down, Depressed, Hopeless 1 0  PHQ - 2 Score 1 0       Assessment & Plan:    Patient Active  Problem List   Diagnosis Date Noted   Submental lymphadenopathy 09/05/2022   Screening for STDs (sexually transmitted diseases) 07/26/2022   History of syphilis 07/26/2022   Neck swelling 07/26/2022   AIDS (acquired immune deficiency syndrome) ([place]) 07/25/2022     Problem List Items Addressed This Visit   None    I am having Logan Ward maintain his Biktarvy and sulfamethoxazole-trimethoprim.   No orders of the defined types were placed in this encounter.    Follow-up: No follow-ups on file.   Terri Piedra, MSN, FNP-C Nurse Practitioner [facility] for Infectious Disease [place] number: [phone]

## 2022-10-03 ENCOUNTER — Other Ambulatory Visit (HOSPITAL_COMMUNITY): Payer: Self-pay

## 2022-10-04 ENCOUNTER — Other Ambulatory Visit (HOSPITAL_COMMUNITY): Payer: Self-pay

## 2022-10-04 ENCOUNTER — Telehealth: Payer: Self-pay

## 2022-10-04 NOTE — Telephone Encounter (Signed)
Patient called office stating he is having issues filling his medication from [facility]. Called [facility] and spoke with [place] who states primary insurance is denying prescription. Patient will need to enroll in [place].  Spoke with Shelly Coss tech who was able to confirm this. States that [place] requires patient's to enroll in this program to help cover prescription. Can offer patient one week of samples while he does this.  Attempted to call back patient back. Not able to reach him. Left voicemail requesting call back. Leatrice Jewels, RMA

## 2022-10-09 NOTE — Telephone Encounter (Signed)
Spoke with patient and relayed that he needs to enroll in [place]. Patient does not have his insurance card; provided phone number for [employer] per [place] [phone]) and advised patient to call [employer] and request enrollment.   Offered patient additional week of samples to ensure no missed doses prior to his appointment on 11/2. Patient stated he would come in for samples tomorrow morning.  Binnie Kand, RN

## 2022-10-14 ENCOUNTER — Other Ambulatory Visit (HOSPITAL_COMMUNITY): Payer: Self-pay

## 2022-10-16 ENCOUNTER — Other Ambulatory Visit: Payer: Self-pay | Admitting: Pharmacist

## 2022-10-16 DIAGNOSIS — B20 Human immunodeficiency virus [HIV] disease: Secondary | ICD-10-CM

## 2022-10-16 MED ORDER — BICTEGRAVIR-EMTRICITAB-TENOFOV 50-200-25 MG PO TABS: 1.0000 | ORAL_TABLET | Freq: Every day | ORAL | 0 refills | Status: AC

## 2022-10-16 NOTE — Progress Notes (Signed)
Medication Samples have been provided to the patient.  Drug name: Biktarvy        Strength: 50/200/25 mg       Qty: 7 tablets (1 bottles) LOT: [place]   Exp.Date: 9/25  Dosing instructions: Take one tablet by mouth once daily  The patient has been instructed regarding the correct time, dose, and frequency of taking this medication, including desired effects and most common side effects.   Alfonse Spruce, PharmD, CPP, BCIDP, [place] Clinical Pharmacist Practitioner Infectious [place] for Infectious Disease

## 2022-10-17 ENCOUNTER — Other Ambulatory Visit (HOSPITAL_COMMUNITY): Payer: Self-pay

## 2022-10-17 ENCOUNTER — Ambulatory Visit: Payer: 59 | Admitting: Family

## 2022-10-18 ENCOUNTER — Other Ambulatory Visit: Payer: Self-pay | Admitting: Pharmacist

## 2022-10-18 DIAGNOSIS — B20 Human immunodeficiency virus [HIV] disease: Secondary | ICD-10-CM

## 2022-10-18 MED ORDER — BICTEGRAVIR-EMTRICITAB-TENOFOV 50-200-25 MG PO TABS: 1.0000 | ORAL_TABLET | Freq: Every day | ORAL | 0 refills | Status: DC

## 2022-10-18 NOTE — Progress Notes (Signed)
Medication Samples have been provided to the patient.  Drug name: Biktarvy        Strength: 50/200/25 mg       Qty: 14 tablets (2 bottles) LOT: [place]   Exp.Date: 9/25  Dosing instructions: Take one tablet by mouth once daily  The patient has been instructed regarding the correct time, dose, and frequency of taking this medication, including desired effects and most common side effects.   Per sample documentation, patient given 1 bottle of Biktarvy samples on 10/30 and given 2 bottles on 11/3.   Alfonse Spruce, PharmD, CPP, BCIDP, [place] Clinical Pharmacist Practitioner Infectious [place] for Infectious Disease

## 2022-10-21 ENCOUNTER — Other Ambulatory Visit: Payer: 59

## 2022-10-21 ENCOUNTER — Other Ambulatory Visit: Payer: Self-pay

## 2022-10-21 DIAGNOSIS — Z79899 Other long term (current) drug therapy: Secondary | ICD-10-CM

## 2022-10-21 DIAGNOSIS — B20 Human immunodeficiency virus [HIV] disease: Secondary | ICD-10-CM

## 2022-10-22 LAB — T-HELPER CELL (CD4) - (RCID CLINIC ONLY)
CD4 % Helper T Cell: 22 % — ABNORMAL LOW (ref 33–65)
CD4 T Cell Abs: 501 /uL (ref 400–1790)

## 2022-10-23 ENCOUNTER — Ambulatory Visit: Payer: 59 | Admitting: Family

## 2022-10-23 LAB — LIPID PANEL
Cholesterol: 104 mg/dL (ref <200)
HDL: 31 mg/dL — ABNORMAL LOW (ref >=40)
LDL Cholesterol (Calc): 48 mg/dL (calc)
Non-HDL Cholesterol (Calc): 73 mg/dL (calc) (ref <130)
Total CHOL/HDL Ratio: 3.4 (calc) (ref <5.0)
Triglycerides: 175 mg/dL — ABNORMAL HIGH (ref <150)

## 2022-10-23 LAB — COMPLETE METABOLIC PANEL WITH GFR
AG Ratio: 1.5 (calc) (ref 1.0–2.5)
ALT: 22 U/L (ref 9–46)
AST: 25 U/L (ref 10–40)
Albumin: 4.5 g/dL (ref 3.6–5.1)
Alkaline phosphatase (APISO): 56 U/L (ref 36–130)
BUN: 12 mg/dL (ref 7–25)
CO2: 25 mmol/L (ref 20–32)
Calcium: 9.2 mg/dL (ref 8.6–10.3)
Chloride: 103 mmol/L (ref 98–110)
Creat: 1.05 mg/dL (ref 0.60–1.26)
Globulin: 3 g/dL (calc) (ref 1.9–3.7)
Glucose, Bld: 79 mg/dL (ref 65–99)
Potassium: 4.4 mmol/L (ref 3.5–5.3)
Sodium: 135 mmol/L (ref 135–146)
Total Bilirubin: 0.6 mg/dL (ref 0.2–1.2)
Total Protein: 7.5 g/dL (ref 6.1–8.1)
eGFR: 93 mL/min/{1.73_m2} (ref >=60)

## 2022-10-23 LAB — CBC WITH DIFFERENTIAL/PLATELET
Absolute Monocytes: 280 cells/uL (ref 200–950)
Basophils Absolute: 20 cells/uL (ref 0–200)
Basophils Relative: 0.5 %
Eosinophils Absolute: 40 cells/uL (ref 15–500)
Eosinophils Relative: 1 %
HCT: 40 % (ref 38.5–50.0)
Hemoglobin: 13 g/dL — ABNORMAL LOW (ref 13.2–17.1)
Lymphs Abs: 2644 cells/uL (ref 850–3900)
MCH: 27.9 pg (ref 27.0–33.0)
MCHC: 32.5 g/dL (ref 32.0–36.0)
MCV: 85.8 fL (ref 80.0–100.0)
MPV: 10.7 fL (ref 7.5–12.5)
Monocytes Relative: 7 %
Neutro Abs: 1016 cells/uL — ABNORMAL LOW (ref 1500–7800)
Neutrophils Relative %: 25.4 %
Platelets: 227 10*3/uL (ref 140–400)
RBC: 4.66 10*6/uL (ref 4.20–5.80)
RDW: 13.6 % (ref 11.0–15.0)
Total Lymphocyte: 66.1 %
WBC: 4 10*3/uL (ref 3.8–10.8)

## 2022-10-23 LAB — HIV-1 RNA QUANT-NO REFLEX-BLD
HIV 1 RNA Quant: 324 Copies/mL — ABNORMAL HIGH
HIV-1 RNA Quant, Log: 2.51 Log cps/mL — ABNORMAL HIGH

## 2022-10-23 NOTE — Progress Notes (Unsigned)
Brief Narrative   Patient ID: Logan Ward, male    DOB: 07-Apr-1981, 41 y.o.   MRN: [id number]  Logan Ward is a 41 y/o AA male diagnosed with HIV disease in December 2020 and starting care after ED visit on 07/25/22 with initial viral load of 54,600 and CD4 count of 115. Risk factor for HIV acquisition is bisexual contact.No history of opportunistic infection. Enters care at [facility] Stage 3. Initial lab work include Genotype are in progress. Started on Biktarvy and Bactrim.    Subjective:    No chief complaint on file.   HPI:  Logan Ward is a 41 y.o. male with HIV disease last seen on 08/28/22 with well controlled virus and good adherence and tolerance to [place] with Bactrim for OI prophylaxis. Viral load was undetectable and CD4 count 162. Subsequently seen on 09/05/22 for submental lymphadenopathy with recommendations for CT scan and potential biopsy. These were cancelled. Most recently lab work completed on 10/21/22 with viral load pending and CD4 count 501. Renal function, hepatic function and electrolytes within normal ranges. Here today for follow up.   No Known Allergies    Outpatient Medications Prior to Visit  Medication Sig Dispense Refill   bictegravir-emtricitabine-tenofovir AF (BIKTARVY) 50-200-25 MG TABS tablet Take 1 tablet by mouth daily. 30 tablet 5   bictegravir-emtricitabine-tenofovir AF (BIKTARVY) 50-200-25 MG TABS tablet Take 1 tablet by mouth daily for 14 days. 14 tablet 0   sulfamethoxazole-trimethoprim (BACTRIM DS) 800-160 MG tablet Take 1 tablet by mouth daily. 30 tablet 5   No facility-administered medications prior to visit.     Past Medical History:  Diagnosis Date   Anal fissure    HIV infection ([place])    Internal hemorrhoids    Obesity    Rectal ulcer      No past surgical history on file.    Review of Systems  Constitutional:  Negative for appetite change, chills, fatigue, fever and unexpected weight change.  Eyes:  Negative for visual  disturbance.  Respiratory:  Negative for cough, chest tightness, shortness of breath and wheezing.   Cardiovascular:  Negative for chest pain and leg swelling.  Gastrointestinal:  Negative for abdominal pain, constipation, diarrhea, nausea and vomiting.  Genitourinary:  Negative for dysuria, flank pain, frequency, genital sores, hematuria and urgency.  Skin:  Negative for rash.  Allergic/Immunologic: Negative for immunocompromised state.  Neurological:  Negative for dizziness and headaches.      Objective:    There were no vitals taken for this visit. Nursing note and vital signs reviewed.  Physical Exam Constitutional:      General: He is not in acute distress.    Appearance: He is well-developed.  Eyes:     Conjunctiva/sclera: Conjunctivae normal.  Cardiovascular:     Rate and Rhythm: Normal rate and regular rhythm.     Heart sounds: Normal heart sounds. No murmur heard.    No friction rub. No gallop.  Pulmonary:     Effort: Pulmonary effort is normal. No respiratory distress.     Breath sounds: Normal breath sounds. No wheezing or rales.  Chest:     Chest wall: No tenderness.  Abdominal:     General: Bowel sounds are normal.     Palpations: Abdomen is soft.     Tenderness: There is no abdominal tenderness.  Musculoskeletal:     Cervical back: Neck supple.  Lymphadenopathy:     Cervical: No cervical adenopathy.  Skin:    General: Skin is warm  and dry.     Findings: No rash.  Neurological:     Mental Status: He is alert and oriented to person, place, and time.  Psychiatric:        Behavior: Behavior normal.        Thought Content: Thought content normal.        Judgment: Judgment normal.         08/28/2022   11:04 AM 07/25/2022    2:37 PM  Depression screen PHQ 2/9  Decreased Interest 0 0  Down, Depressed, Hopeless 1 0  PHQ - 2 Score 1 0       Assessment & Plan:    Patient Active Problem List   Diagnosis Date Noted   Submental lymphadenopathy  09/05/2022   Screening for STDs (sexually transmitted diseases) 07/26/2022   History of syphilis 07/26/2022   Neck swelling 07/26/2022   AIDS (acquired immune deficiency syndrome) ([place]) 07/25/2022     Problem List Items Addressed This Visit   None    I am having Logan Ward maintain his Biktarvy, sulfamethoxazole-trimethoprim, and bictegravir-emtricitabine-tenofovir AF.   No orders of the defined types were placed in this encounter.    Follow-up: No follow-ups on file.   Terri Piedra, MSN, FNP-C Nurse Practitioner [facility] for Infectious Disease [place] number: [phone]

## 2022-10-24 ENCOUNTER — Other Ambulatory Visit (HOSPITAL_COMMUNITY)
Admission: RE | Admit: 2022-10-24 | Discharge: 2022-10-24 | Disposition: A | Discharge status: Home / Self Care | Payer: 59 | Source: Ambulatory Visit | Attending: Family | Admitting: Family

## 2022-10-24 ENCOUNTER — Ambulatory Visit (INDEPENDENT_AMBULATORY_CARE_PROVIDER_SITE_OTHER): Payer: 59 | Admitting: Family

## 2022-10-24 ENCOUNTER — Ambulatory Visit: Payer: 59

## 2022-10-24 ENCOUNTER — Encounter: Payer: Self-pay | Admitting: Family

## 2022-10-24 ENCOUNTER — Other Ambulatory Visit: Payer: Self-pay

## 2022-10-24 VITALS — BP 117/70 | HR 75 | Temp 98.1°F | Resp 16 | Ht 73.0 in | Wt 277.0 lb

## 2022-10-24 DIAGNOSIS — B20 Human immunodeficiency virus [HIV] disease: Secondary | ICD-10-CM

## 2022-10-24 DIAGNOSIS — Z202 Contact with and (suspected) exposure to infections with a predominantly sexual mode of transmission: Secondary | ICD-10-CM | POA: Diagnosis present

## 2022-10-24 DIAGNOSIS — R221 Localized swelling, mass and lump, neck: Secondary | ICD-10-CM | POA: Diagnosis not present

## 2022-10-24 MED ORDER — AZITHROMYCIN 250 MG PO TABS
1000.0000 mg | ORAL_TABLET | Freq: Once | ORAL | Status: AC
  Administered 2022-10-24: 1000 mg via ORAL

## 2022-10-24 MED ORDER — AZITHROMYCIN 250 MG PO TABS: 1000.0000 mg | ORAL_TABLET | Freq: Once | ORAL | Status: DC

## 2022-10-24 NOTE — Progress Notes (Signed)
HIV Intake   Logan Ward (41 y.o., Ward)    Diagnosis Date:  December 2020  Into care date:  07/25/2022   Initial VL:   54,600  Initial CD4:  155   Risk factors: MSM  Referring agency/circumstances of diagnosis: [facility]/DIS referral  Last (-) HIV test: Logan/24/2020  PrEP history:   N/a   Patient Bio:  Logan Ward diagnosed with HIV disease in December 2020 and starting care after ED visit on 07/25/22   Discussed effect of ART on VL and CD4, discussed U=U, natural progression of disease and risk for other STIs.    Housing: n/a  Transportation: n/a  Food Security: n/a  Sexual Health:   Sex/gender of partners: Ward  STI history: recent exposure to chlamydia; cytology results pending. Treated 10/24/22 with Azithromycin '1000mg'$  PO once today.   Methods of protection: condoms  Family planning/contraception use or interest: n/a  Pap (cervical/anal) history: n/a  Number of children and pregnancy history: n/a  History of sexual abuse or commercial sex work: n/a  Current relationship status: single   Substance Use  Tobacco/alcohol/substances: never/drinks occasionally    Interest in treatment: n/a   Mental Health  Dx history and medications:n/a   Current symptoms: n/a  Interest in treatment/counseling: declines   Primary Care  Other medical conditions: neck swelling- improving  Medications:  Biktarvy Bactrim  PCP: none- referral made  Discussed importance of primary care for management of non-ID conditions.   Dental  Dental concerns: none. Dental referral made previously   Patient counseled on prevention of other STIs. Discussed recommended vaccines for persons living with HIV.     Medication  Barriers to adherence: previously had issues with pharmacy, but now resolved   Medication tolerance: tolerates medications well   Referrals made: [facility].     Additional notes: sent link for Mychart  set up. Previous Dental referral already place. Patient currently awaiting appointment. Patient denies any barriers at this time. Follow up sceduled  Eugenia Mcalpine, LPN

## 2022-10-24 NOTE — Assessment & Plan Note (Addendum)
Logan Ward has decent adherence and good tolerance to [employer]. Low level viremia with most recent lab work likely consistant with missed doses of medication. Fortunately his CD4 count has recovered. Continue current dose of Biktarvy. Will need to continue Bactrim for at least the next 2 months. Plan for follow up in 2 month or sooner if needed.

## 2022-10-24 NOTE — Assessment & Plan Note (Signed)
Logan Ward continues to have neck swelling which is concerning that this is beyond a reactive process or infection and certainly malignancy cannot be ruled out. Will obtain CT scan of neck for follow up and anticipate will need referral to IR for biopsy. No antibiotics needed at the present time. Await CT scan results. Advised to seek further care if dysphasia or shortness of breath develops.

## 2022-10-24 NOTE — Assessment & Plan Note (Signed)
Logan Ward was recently exposed to chlamydia and is currently without symptoms. Will treat with 1 g Azithromycin once. Discussed importance of safe sexual practice and condom use. Condoms and STD testing offered.

## 2022-10-24 NOTE — Telephone Encounter (Signed)
A user error has taken place: encounter opened in error, closed for administrative reasons.

## 2022-10-24 NOTE — Patient Instructions (Addendum)
Nice to see you.  Continue to take your medication daily as prescribed.  Refills have been sent to the pharmacy.  Plan for follow up in 2 months or sooner if needed with lab work on the same day.  Have a great day and stay safe!

## 2022-10-25 LAB — CYTOLOGY, (ORAL, ANAL, URETHRAL) ANCILLARY ONLY
Chlamydia: NEGATIVE
Comment: NEGATIVE
Comment: NORMAL
Neisseria Gonorrhea: NEGATIVE

## 2022-11-04 ENCOUNTER — Ambulatory Visit (HOSPITAL_COMMUNITY)
Admission: RE | Admit: 2022-11-04 | Discharge: 2022-11-04 | Disposition: A | Discharge status: Home / Self Care | Payer: 59 | Source: Ambulatory Visit | Attending: Infectious Diseases | Admitting: Infectious Diseases

## 2022-11-04 DIAGNOSIS — R590 Localized enlarged lymph nodes: Secondary | ICD-10-CM | POA: Diagnosis not present

## 2022-11-04 MED ORDER — IOHEXOL 350 MG/ML SOLN
75.0000 mL | Freq: Once | INTRAVENOUS | Status: AC | PRN
  Administered 2022-11-04: 75 mL via INTRAVENOUS

## 2022-11-05 ENCOUNTER — Telehealth: Payer: Self-pay | Admitting: Infectious Diseases

## 2022-11-05 DIAGNOSIS — R221 Localized swelling, mass and lump, neck: Secondary | ICD-10-CM

## 2022-11-05 DIAGNOSIS — R590 Localized enlarged lymph nodes: Secondary | ICD-10-CM

## 2022-11-05 DIAGNOSIS — J9859 Other diseases of mediastinum, not elsewhere classified: Secondary | ICD-10-CM

## 2022-11-05 NOTE — Addendum Note (Signed)
Addended by: Maverick Callas on: 11/05/2022 11:45 AM   Modules accepted: Orders

## 2022-11-05 NOTE — Telephone Encounter (Signed)
I spoke with Phylliss Blakes and went over the results. Plan discussed with further CT scan of chest/abdomen/pelvis and IR to obtain core needle biopsy.   He is agreeable to proceed.  Mobile number is the best contact.  [place] location works well for him.

## 2022-11-05 NOTE — Progress Notes (Signed)
D/W radiologist over the phone. Will ask Marya Amsler who typically sees the patient to order c/a/p with contrast to further clarify extend of the mass and place order for core needle biopsy with IR colleagues.

## 2022-11-05 NOTE — Telephone Encounter (Addendum)
I tried to give Logan Ward a call on his mobile number.  Message left to call back for results.  Logan Ward had a CT scan of the head and neck recently that showed increased process of enlarged lymph nodes and submandibular-mediastinal mass along the anterior chest wall adjacent to the sternoclavicular joint on the right measuring 4.2 x 3.4 x 5.6 cm.  This is increased since last CT scan several months ago earlier this year.  These findings are concerning for a malignancy.  Potentials include Kaposi sarcoma or lymphoma.   We need to place an additional order for CT scan of the chest abdomen pelvis to look for other visceral disease.  We will also place an order for a core needle biopsy of the large mass for pathology and AFB + fungal stain.  I will try to call him again this afternoon to go over results.

## 2022-11-10 ENCOUNTER — Emergency Department (HOSPITAL_COMMUNITY): Payer: 59

## 2022-11-10 ENCOUNTER — Other Ambulatory Visit: Payer: Self-pay

## 2022-11-10 ENCOUNTER — Encounter (HOSPITAL_COMMUNITY): Payer: Self-pay

## 2022-11-10 ENCOUNTER — Inpatient Hospital Stay (HOSPITAL_COMMUNITY)
Admission: EM | Admit: 2022-11-10 | Discharge: 2022-11-22 | DRG: 969 | Disposition: A | Discharge status: Home / Self Care | Payer: 59 | Attending: Internal Medicine | Admitting: Internal Medicine

## 2022-11-10 DIAGNOSIS — R0602 Shortness of breath: Principal | ICD-10-CM

## 2022-11-10 DIAGNOSIS — K5903 Drug induced constipation: Secondary | ICD-10-CM

## 2022-11-10 DIAGNOSIS — K1230 Oral mucositis (ulcerative), unspecified: Secondary | ICD-10-CM | POA: Diagnosis not present

## 2022-11-10 DIAGNOSIS — C8378 Burkitt lymphoma, lymph nodes of multiple sites: Principal | ICD-10-CM

## 2022-11-10 DIAGNOSIS — Z6837 Body mass index (BMI) 37.0-37.9, adult: Secondary | ICD-10-CM | POA: Diagnosis not present

## 2022-11-10 DIAGNOSIS — Z5111 Encounter for antineoplastic chemotherapy: Secondary | ICD-10-CM | POA: Diagnosis not present

## 2022-11-10 DIAGNOSIS — E883 Tumor lysis syndrome: Secondary | ICD-10-CM | POA: Diagnosis not present

## 2022-11-10 DIAGNOSIS — R222 Localized swelling, mass and lump, trunk: Secondary | ICD-10-CM | POA: Diagnosis present

## 2022-11-10 DIAGNOSIS — R110 Nausea: Secondary | ICD-10-CM

## 2022-11-10 DIAGNOSIS — Z8619 Personal history of other infectious and parasitic diseases: Secondary | ICD-10-CM | POA: Diagnosis not present

## 2022-11-10 DIAGNOSIS — Z1152 Encounter for screening for COVID-19: Secondary | ICD-10-CM | POA: Diagnosis not present

## 2022-11-10 DIAGNOSIS — Z808 Family history of malignant neoplasm of other organs or systems: Secondary | ICD-10-CM

## 2022-11-10 DIAGNOSIS — B20 Human immunodeficiency virus [HIV] disease: Secondary | ICD-10-CM | POA: Diagnosis present

## 2022-11-10 DIAGNOSIS — R221 Localized swelling, mass and lump, neck: Secondary | ICD-10-CM

## 2022-11-10 DIAGNOSIS — R519 Headache, unspecified: Secondary | ICD-10-CM

## 2022-11-10 DIAGNOSIS — E669 Obesity, unspecified: Secondary | ICD-10-CM | POA: Diagnosis present

## 2022-11-10 DIAGNOSIS — R112 Nausea with vomiting, unspecified: Secondary | ICD-10-CM

## 2022-11-10 DIAGNOSIS — Z79899 Other long term (current) drug therapy: Secondary | ICD-10-CM

## 2022-11-10 DIAGNOSIS — Z7189 Other specified counseling: Secondary | ICD-10-CM

## 2022-11-10 DIAGNOSIS — Z0189 Encounter for other specified special examinations: Secondary | ICD-10-CM | POA: Diagnosis not present

## 2022-11-10 LAB — COMPREHENSIVE METABOLIC PANEL
ALT: 14 U/L (ref 0–44)
AST: 31 U/L (ref 15–41)
Albumin: 3.9 g/dL (ref 3.5–5.0)
Alkaline Phosphatase: 54 U/L (ref 38–126)
Anion gap: 13 (ref 5–15)
BUN: 8 mg/dL (ref 6–20)
CO2: 22 mmol/L (ref 22–32)
Calcium: 9.1 mg/dL (ref 8.9–10.3)
Chloride: 103 mmol/L (ref 98–111)
Creatinine, Ser: 1.11 mg/dL (ref 0.61–1.24)
GFR, Estimated: >60 mL/min (ref >60)
Glucose, Bld: 95 mg/dL (ref 70–99)
Potassium: 4.4 mmol/L (ref 3.5–5.1)
Sodium: 138 mmol/L (ref 135–145)
Total Bilirubin: 1.2 mg/dL (ref 0.3–1.2)
Total Protein: 7.3 g/dL (ref 6.5–8.1)

## 2022-11-10 LAB — CBC WITH DIFFERENTIAL/PLATELET
Abs Immature Granulocytes: 0.01 10*3/uL (ref 0.00–0.07)
Basophils Absolute: 0 10*3/uL (ref 0.0–0.1)
Basophils Relative: 0 %
Eosinophils Absolute: 0.1 10*3/uL (ref 0.0–0.5)
Eosinophils Relative: 1 %
HCT: 42.2 % (ref 39.0–52.0)
Hemoglobin: 13.4 g/dL (ref 13.0–17.0)
Immature Granulocytes: 0 %
Lymphocytes Relative: 53 %
Lymphs Abs: 2.5 10*3/uL (ref 0.7–4.0)
MCH: 27.3 pg (ref 26.0–34.0)
MCHC: 31.8 g/dL (ref 30.0–36.0)
MCV: 86.1 fL (ref 80.0–100.0)
Monocytes Absolute: 0.5 10*3/uL (ref 0.1–1.0)
Monocytes Relative: 10 %
Neutro Abs: 1.7 10*3/uL (ref 1.7–7.7)
Neutrophils Relative %: 36 %
Platelets: 185 10*3/uL (ref 150–400)
RBC: 4.9 MIL/uL (ref 4.22–5.81)
RDW: 14.3 % (ref 11.5–15.5)
WBC: 4.8 10*3/uL (ref 4.0–10.5)
nRBC: 0 % (ref 0.0–0.2)

## 2022-11-10 LAB — RESP PANEL BY RT-PCR (FLU A&B, COVID) ARPGX2
Influenza A by PCR: NEGATIVE
Influenza B by PCR: NEGATIVE
SARS Coronavirus 2 by RT PCR: NEGATIVE

## 2022-11-10 LAB — C-REACTIVE PROTEIN: CRP: 4.2 mg/dL — ABNORMAL HIGH (ref <1.0)

## 2022-11-10 LAB — SEDIMENTATION RATE: Sed Rate: 10 mm/hr (ref 0–16)

## 2022-11-10 MED ORDER — ACETAMINOPHEN 325 MG PO TABS
650.0000 mg | ORAL_TABLET | Freq: Four times a day (QID) | ORAL | Status: DC | PRN
  Administered 2022-11-13 – 2022-11-22 (×5): 650 mg via ORAL
  Filled 2022-11-10 (×5): qty 2

## 2022-11-10 MED ORDER — ACETAMINOPHEN 500 MG PO TABS
1000.0000 mg | ORAL_TABLET | Freq: Once | ORAL | Status: AC
  Administered 2022-11-10: 1000 mg via ORAL
  Filled 2022-11-10: qty 2

## 2022-11-10 MED ORDER — ONDANSETRON HCL 4 MG/2ML IJ SOLN
4.0000 mg | Freq: Four times a day (QID) | INTRAMUSCULAR | Status: DC | PRN
  Administered 2022-11-21: 4 mg via INTRAVENOUS
  Filled 2022-11-10 (×2): qty 2

## 2022-11-10 MED ORDER — ONDANSETRON HCL 4 MG PO TABS
4.0000 mg | ORAL_TABLET | Freq: Four times a day (QID) | ORAL | Status: DC | PRN
  Administered 2022-11-13: 4 mg via ORAL
  Filled 2022-11-10: qty 1

## 2022-11-10 MED ORDER — BICTEGRAVIR-EMTRICITAB-TENOFOV 50-200-25 MG PO TABS
1.0000 | ORAL_TABLET | Freq: Every day | ORAL | Status: DC
  Administered 2022-11-10 – 2022-11-22 (×13): 1 via ORAL
  Filled 2022-11-10 (×13): qty 1

## 2022-11-10 MED ORDER — ACETAMINOPHEN 650 MG RE SUPP: 650.0000 mg | Freq: Four times a day (QID) | RECTAL | Status: DC | PRN

## 2022-11-10 MED ORDER — IOHEXOL 350 MG/ML SOLN
100.0000 mL | Freq: Once | INTRAVENOUS | Status: AC | PRN
  Administered 2022-11-10: 100 mL via INTRAVENOUS

## 2022-11-10 MED ORDER — SULFAMETHOXAZOLE-TRIMETHOPRIM 800-160 MG PO TABS
1.0000 | ORAL_TABLET | Freq: Every day | ORAL | Status: DC
  Administered 2022-11-10 – 2022-11-19 (×10): 1 via ORAL
  Filled 2022-11-10 (×11): qty 1

## 2022-11-10 MED ORDER — IOHEXOL 350 MG/ML SOLN: 100.0000 mL | Freq: Once | INTRAVENOUS | Status: DC | PRN

## 2022-11-10 NOTE — ED Notes (Signed)
Pt refused to put a gown on

## 2022-11-10 NOTE — H&P (Signed)
History and Physical    Logan Ward  PRF:[id number]  DOB: May 10, 1981  DOA: 11/10/2022 PCP: Pcp, No   Patient coming from: Home  Chief Complaint: Mass in chest  HPI: Logan Ward is a 41 y.o. male with medical history of HIV and syphilis presents with a rapidly growing mass in the right anterior chest wall.  He states that it has been there for about a month and a half however, for the past week it has grown from the size of a quarter to the size it is now.  It is associated with severe pain.  He states that today he has noticed some shortness of breath which he is also attributing to this mass.  He has not had any cough, sore throat or runny nose.  No complaints of fevers.   ED Course: CT imaging performed  Review of Systems:  All other systems reviewed and apart from HPI, are negative.  Past Medical History:  Diagnosis Date   Anal fissure    HIV infection ([place])    Internal hemorrhoids    Obesity    Rectal ulcer     History reviewed. No pertinent surgical history.  Social History:   reports that he has never smoked. He has never used smokeless tobacco. He reports that he does not drink alcohol and does not use drugs.  No Known Allergies  Family History  Problem Relation Age of Onset   Hypertension Father    Throat cancer Father    Heart attack Brother    Colon cancer Neg Hx    Esophageal cancer Neg Hx    Stomach cancer Neg Hx      Prior to Admission medications   Medication Sig Start Date End Date Taking? Authorizing Provider  bictegravir-emtricitabine-tenofovir AF (BIKTARVY) 50-200-25 MG TABS tablet Take 1 tablet by mouth daily. 09/05/22   [place] Callas, NP  sulfamethoxazole-trimethoprim (BACTRIM DS) 800-160 MG tablet Take 1 tablet by mouth daily. 09/05/22   [place] Callas, NP    Physical Exam: Wt Readings from Last 3 Encounters:  10/24/22 125.6 kg  09/05/22 132.5 kg  08/28/22 132.5 kg   Vitals:   11/10/22 1603 11/10/22 1605 11/10/22 1630  11/10/22 1700  BP: 135/72  119/84 120/72  Pulse: 79 82 81 83  Resp: (!) 24 (!) '23 17 17  '$ Temp:      SpO2: 99% 98% 99% 99%      Constitutional:  Calm & comfortable Eyes: PERRLA, lids and conjunctivae normal ENT:  Mucous membranes are moist.  Pharynx clear of exudate   Normal dentition.  Respiratory:  Clear to auscultation bilaterally  Normal respiratory effort.  Chest wall:  Cardiovascular:  S1 & S2 heard, regular rate and rhythm No Murmurs Abdomen:  Non distended No tenderness, No masses Bowel sounds normal Extremities:  No clubbing / cyanosis No pedal edema  Skin:  No rashes, lesions or ulcers Neurologic:  AAO x 3 CN 2-12 grossly intact Sensation intact Strength 5/5 in all 4 extremities Psychiatric:  Normal Mood and affect    Labs on Admission: I have personally reviewed following labs and imaging studies  CBC: Recent Labs  Lab 11/10/22 1353  WBC 4.8  NEUTROABS 1.7  HGB 13.4  HCT 42.2  MCV 86.1  PLT 935   Basic Metabolic Panel: Recent Labs  Lab 11/10/22 1353  NA 138  K 4.4  CL 103  CO2 22  GLUCOSE 95  BUN 8  CREATININE 1.11  CALCIUM 9.1  GFR: CrCl cannot be calculated (Unknown ideal weight.). Liver Function Tests: Recent Labs  Lab 11/10/22 1353  AST 31  ALT 14  ALKPHOS 54  BILITOT 1.2  PROT 7.3  ALBUMIN 3.9   No results for input(s): "LIPASE", "AMYLASE" in the last 168 hours. No results for input(s): "AMMONIA" in the last 168 hours. Coagulation Profile: No results for input(s): "INR", "PROTIME" in the last 168 hours. Cardiac Enzymes: No results for input(s): "CKTOTAL", "CKMB", "CKMBINDEX", "TROPONINI" in the last 168 hours. BNP (last 3 results) No results for input(s): "PROBNP" in the last 8760 hours. HbA1C: No results for input(s): "HGBA1C" in the last 72 hours. CBG: No results for input(s): "GLUCAP" in the last 168 hours. Lipid Profile: No results for input(s): "CHOL", "HDL", "LDLCALC", "TRIG", "CHOLHDL", "LDLDIRECT"  in the last 72 hours. Thyroid Function Tests: No results for input(s): "TSH", "T4TOTAL", "FREET4", "T3FREE", "THYROIDAB" in the last 72 hours. Anemia Panel: No results for input(s): "VITAMINB12", "FOLATE", "FERRITIN", "TIBC", "IRON", "RETICCTPCT" in the last 72 hours. Urine analysis:    Component Value Date/Time   COLORURINE YELLOW 06/07/2022 [place] 06/07/2022 1545   LABSPEC 1.025 06/07/2022 1545   PHURINE 6.0 06/07/2022 1545   GLUCOSEU NEGATIVE 06/07/2022 1545   HGBUR NEGATIVE 06/07/2022 1545   [place] 06/07/2022 1545   KETONESUR NEGATIVE 06/07/2022 1545   PROTEINUR NEGATIVE 06/07/2022 1545   UROBILINOGEN 1.0 09/02/2013 0854   NITRITE NEGATIVE 06/07/2022 1545   LEUKOCYTESUR NEGATIVE 06/07/2022 1545   Sepsis Labs: '@LABRCNTIP'$ (procalcitonin:4,lacticidven:4) ) Recent Results (from the past 240 hour(s))  Resp Panel by RT-PCR (Flu A&B, Covid) Anterior Nasal Swab     Status: None   Collection Time: 11/10/22  2:03 PM   Specimen: Anterior Nasal Swab  Result Value Ref Range Status   SARS Coronavirus 2 by RT PCR NEGATIVE NEGATIVE Final    Comment: (NOTE) SARS-CoV-2 target nucleic acids are NOT DETECTED.  The SARS-CoV-2 RNA is generally detectable in upper respiratory specimens during the acute phase of infection. The lowest concentration of SARS-CoV-2 viral copies this assay can detect is 138 copies/mL. A negative result does not preclude SARS-Cov-2 infection and should not be used as the sole basis for treatment or other patient management decisions. A negative result may occur with  improper specimen collection/handling, submission of specimen other than nasopharyngeal swab, presence of viral mutation(s) within the areas targeted by this assay, and inadequate number of viral copies(<138 copies/mL). A negative result must be combined with clinical observations, patient history, and epidemiological information. The expected result is Negative.  Fact  Sheet for Patients:  EntrepreneurPulse.com.au  Fact Sheet for Healthcare Providers:  IncredibleEmployment.be  This test is no t yet approved or cleared by the [place] FDA and  has been authorized for detection and/or diagnosis of SARS-CoV-2 by FDA under an Emergency Use Authorization (EUA). This EUA will remain  in effect (meaning this test can be used) for the duration of the COVID-19 declaration under Section 564(b)(1) of the Act, 21 U.S.C.section 360bbb-3(b)(1), unless the authorization is terminated  or revoked sooner.       Influenza A by PCR NEGATIVE NEGATIVE Final   Influenza B by PCR NEGATIVE NEGATIVE Final    Comment: (NOTE) The Xpert Xpress SARS-CoV-2/FLU/RSV plus assay is intended as an aid in the diagnosis of influenza from Nasopharyngeal swab specimens and should not be used as a sole basis for treatment. Nasal washings and aspirates are unacceptable for Xpert Xpress SARS-CoV-2/FLU/RSV testing.  Fact Sheet for Patients: EntrepreneurPulse.com.au  Fact Sheet for Healthcare Providers: IncredibleEmployment.be  This test is not yet approved or cleared by the [place] FDA and has been authorized for detection and/or diagnosis of SARS-CoV-2 by FDA under an Emergency Use Authorization (EUA). This EUA will remain in effect (meaning this test can be used) for the duration of the COVID-19 declaration under Section 564(b)(1) of the Act, 21 U.S.C. section 360bbb-3(b)(1), unless the authorization is terminated or revoked.  Performed at [facility], [place] [address], [place], [place] [address]      Radiological Exams on Admission: CT Soft Tissue Neck W Contrast  Result Date: 11/10/2022 CLINICAL DATA:  Neck mass with history of HIV. EXAM: CT NECK WITH CONTRAST TECHNIQUE: Multidetector CT imaging of the neck was performed using the standard protocol following the bolus administration of  intravenous contrast. RADIATION DOSE REDUCTION: This exam was performed according to the departmental dose-optimization program which includes automated exposure control, adjustment of the mA and/or kV according to patient size and/or use of iterative reconstruction technique. CONTRAST:  148m OMNIPAQUE IOHEXOL 350 MG/ML SOLN COMPARISON:  CT neck 6 days ago. FINDINGS: Pharynx and larynx: The nasal cavity and nasopharynx are unremarkable. The oral cavity and oropharynx are unremarkable. The parapharyngeal spaces are clear. The hypopharynx and larynx are unremarkable. The vocal folds are normal in appearance. There is no retropharyngeal fluid collection.  The airway is patent. Salivary glands: The parotid and submandibular glands are unremarkable. Thyroid: Unremarkable. Lymph nodes: There is enlarged centrally necrotic right level I lymph node measuring up to 2.1 cm in short axis, similar to the CT from 6 days ago. The large infiltrative centrally necrotic soft tissue mass throughout the right neck measuring up to approximately 8.1 cm TV x 5.9 cm AP by 7.4 cm cc is also not significantly changed in size. Inferior extent into the upper chest anterior to the thyroid with centrally necrotic appearing tissue measuring 4.9 cm x 5.4 cm is unchanged when measured again using similar technique. Overlying skin thickening is similar. The mass is inseparable from the sternocleidomastoid muscle. There are numerous additional prominent right-sided cervical chain lymph nodes measuring up to 1.0 cm at level IIA (3-38), and 1.1 cm in short axis in the posterior triangle (3-43). Vascular: The major vasculature of the neck is unremarkable. Limited intracranial: The imaged portions of the posterior fossa are unremarkable. Visualized orbits: Not included within the field of view. Mastoids and visualized paranasal sinuses: Clear. Skeleton: There is no acute osseous abnormality or suspicious osseous lesion. Upper chest: Assessed on the  separately dictated CT chest. Other: None. IMPRESSION: No significant interval change since the CT neck from 6 days prior. Unchanged infiltrative soft tissue mass in the right neck extending to the upper chest anterior to the clavicle with central necrosis and surrounding lymphadenopathy. Differential again includes malignancy (sarcoma, lymphoma), or infection (including TB. Sampling is recommended. Electronically Signed   By: PValetta MoleM.D.   On: 11/10/2022 16:27   CT CHEST ABDOMEN PELVIS W CONTRAST  Result Date: 11/10/2022 CLINICAL DATA:  Lymphoma, neck mass, metastatic disease evaluation * Tracking Code: BO * EXAM: CT CHEST, ABDOMEN, AND PELVIS WITH CONTRAST TECHNIQUE: Multidetector CT imaging of the chest, abdomen and pelvis was performed following the standard protocol during bolus administration of intravenous contrast. RADIATION DOSE REDUCTION: This exam was performed according to the departmental dose-optimization program which includes automated exposure control, adjustment of the mA and/or kV according to patient size and/or use of iterative reconstruction technique. CONTRAST:  1096mOMNIPAQUE IOHEXOL  350 MG/ML SOLN COMPARISON:  CT neck, 11/04/2022, CT pelvis, 02/09/2017 FINDINGS: CT CHEST FINDINGS Cardiovascular: No significant vascular findings. Normal heart size. No pericardial effusion. Mediastinum/Nodes: Enlarged right lower cervical, supraclavicular, and bilateral axillary lymph nodes, largest right axillary nodes measuring up to 2.2 x 1.7 cm (series 3, image 18). Diffuse fat stranding throughout the superior mediastinum (series 3, image 19), thyroid gland, trachea, and esophagus demonstrate no significant findings. Lungs/Pleura: Lungs are clear. No pleural effusion or pneumothorax. Musculoskeletal: Partially imaged, heterogeneous mass centered in the anterior right neck, incompletely imaged and better assessed by simultaneous dedicated examination of the neck (series 3, image 1). No acute  osseous findings. CT ABDOMEN PELVIS FINDINGS Hepatobiliary: No solid liver abnormality is seen. No gallstones, gallbladder wall thickening, or biliary dilatation. Pancreas: Unremarkable. No pancreatic ductal dilatation or surrounding inflammatory changes. Spleen: Normal in size without significant abnormality. Adrenals/Urinary Tract: Adrenal glands are unremarkable. Kidneys are normal, without renal calculi, solid lesion, or hydronephrosis. Bladder is unremarkable. Stomach/Bowel: Stomach is within normal limits. Appendix appears normal. No evidence of bowel wall thickening, distention, or inflammatory changes. Vascular/Lymphatic: No significant vascular findings are present. Prominent subcentimeter retroperitoneal and iliac lymph nodes, for example a right external iliac node measuring 1.1 x 0.8 cm (series 3, image 102). Reproductive: No mass or other abnormality. Other: No abdominal wall hernia or abnormality. No ascites. Diffuse retroperitoneal fat stranding (series 3, image 80). Musculoskeletal: No acute osseous findings. IMPRESSION: 1. Partially imaged, heterogeneous mass centered in the anterior right neck, incompletely imaged and better assessed by simultaneous dedicated examination of the neck. As previously reported, general differential considerations include both malignancy and infectious process such as scrofula. 2. Enlarged right lower cervical, supraclavicular, and bilateral axillary lymph nodes, which may be reactive although are generally concerning for metastatic disease. 3. Prominent subcentimeter retroperitoneal and iliac lymph nodes, nonspecific although as above concerning for additional nodal metastatic disease. 4. Diffuse fat stranding throughout the superior mediastinum, of uncertain nature, possibly reflecting mediastinal infectious involvement or alternately vascular/lymphatic congestion. Prior thoracic radiotherapy could likewise have this appearance. 5. Diffuse retroperitoneal fat  stranding, as above of uncertain significance, possibly infectious or inflammatory, for example incidental and related to pancreatitis, or as above perhaps related to prior abdominal radiotherapy. 6. No mass or evidence of organ metastatic disease in the abdomen or pelvis. Electronically Signed   By: Delanna Ahmadi M.D.   On: 11/10/2022 16:26       Assessment/Plan Principal Problem:   Chest mass -According to the patient, the mass has grown rapidly over the past couple of weeks - CT scan reveals: 8 cm x 5.9 cm x 7.4 cm soft tissue mass with centralized necrosis extending anterior to the thyroid and into the upper chest with surrounding lymphadenopathy -We will ask for an IR consult for biopsy-patient may also need an ID evaluation as underlying etiology of this mass might be infectious  Active Problems:   AIDS (acquired immune deficiency syndrome) ([place]) -CD4 count in 11/6 was 501 -Continue Biktarvy and Bactrim    History of syphilis   DVT prophylaxis: SCDs  Code Status: Full code  Consults called: IR for biopsy  Admission status:  Level of care: Med-Surg  [name] MD [facility]    11/10/2022, 5:58 PM

## 2022-11-10 NOTE — ED Notes (Signed)
Patient transported to CT 

## 2022-11-10 NOTE — ED Notes (Signed)
Patient transported to X-ray 

## 2022-11-10 NOTE — ED Triage Notes (Signed)
Patient had biopsy of right upper chest/ lower neck on Tuesday, this am became SOB and swelling noted to neck. No cough, feels like he can't get a good breath.

## 2022-11-10 NOTE — ED Provider Notes (Signed)
[facility] DEPARTMENT Provider Note   CSN: [id number] Arrival date & time: 11/10/22  1313     History  Chief Complaint  Patient presents with  . Shortness of Breath    Logan Ward is a 41 y.o. male. With past medical history of syphilis, AIDS who presents to the emergency department with shortness of breath.  He states that he has had increasing size of lumps in his neck since last week. He states this morning he woke up and felt like he couldn't breath. States he is having difficulty taking a full breath. He denies cough or fever. Denies chest pain, palpitations, lightheadedness or dizziness.  Notably, the lumps on his neck are not new. On chart review he is followed by infectious disease for AIDS. Appears he was admitted to [facility] on 08/05/2022 for neck swelling.  It appears he was diagnosed with cellulitis and myositis of the anterior neck.  He had ENT and ID consult and was eventually placed on linezolid and moxifloxacin.  He did have some improvement in his symptoms as noted on his follow-up appointment to ID on 08/28/2022.  He was then seen again on 09/05/2022 over progressive swelling and firmness of the manubrium and left chest.  There was an order for CT and potential biopsy that was canceled for unknown reason.  He was seen on 10/24/2022 with mild improvement in his symptoms.  Again a CT scan of the neck and referral for IR for biopsy was ordered.  He had a CT done on 11/04/2022 which showed a multi spatial infiltrative soft tissue mass that extends into the mediastinum and superiorly into the submandibular region.  Findings were concerning for lymphoma versus sarcoma.  ID tried to call him on 11/05/2022 to discuss the results of his CT as well as need for core needle biopsy of the mass.  Appears these are scheduled for 11/18/2022.  HPI     Home Medications Prior to Admission medications   Medication Sig Start Date End Date  Taking? Authorizing Provider  bictegravir-emtricitabine-tenofovir AF (BIKTARVY) 50-200-25 MG TABS tablet Take 1 tablet by mouth daily. 09/05/22   [place] Callas, NP  sulfamethoxazole-trimethoprim (BACTRIM DS) 800-160 MG tablet Take 1 tablet by mouth daily. 09/05/22   [place] Callas, NP      Allergies    Patient has no known allergies.    Review of Systems   Review of Systems  Musculoskeletal:  Positive for neck pain.  All other systems reviewed and are negative.   Physical Exam Updated Vital Signs BP 120/71   Pulse 79   Temp 98.3 F (36.8 C)   Resp (!) 26   SpO2 95%  Physical Exam Vitals and nursing note reviewed.  Constitutional:      General: He is not in acute distress.    Appearance: Normal appearance. He is not ill-appearing or toxic-appearing.  HENT:     Head: Normocephalic.     Mouth/Throat:     Mouth: Mucous membranes are moist.     Pharynx: Oropharynx is clear.     Comments: Oropharynx is clear without swelling. The tongue is not swollen and there is no obvious sublingual edema. No stridor or wheezing noted  Eyes:     General: No scleral icterus.    Extraocular Movements: Extraocular movements intact.     Pupils: Pupils are equal, round, and reactive to light.  Neck:     Comments: Large, at least 5cm submandibular mass  on the right  Smaller maybe 2cm mass that is just superior to the larger mass seen on imaging below. Picture below for chest mass. 5-6cm hard, non-mobile mass to the right chest that overlays the clavicle  Cardiovascular:     Rate and Rhythm: Normal rate and regular rhythm.     Heart sounds: No murmur heard. Pulmonary:     Effort: Pulmonary effort is normal. No respiratory distress.     Breath sounds: Normal breath sounds. No stridor. No wheezing or rales.  Chest:     Chest wall: Tenderness present.  Abdominal:     General: Bowel sounds are normal.     Palpations: Abdomen is soft.  Musculoskeletal:        General: Normal range of  motion.  Lymphadenopathy:     Cervical: Cervical adenopathy present.  Skin:    General: Skin is warm and dry.     Capillary Refill: Capillary refill takes less than 2 seconds.  Neurological:     General: No focal deficit present.     Mental Status: He is alert and oriented to person, place, and time. Mental status is at baseline.  Psychiatric:        Mood and Affect: Mood normal.        Behavior: Behavior normal.     ED Results / Procedures / Treatments   Labs (all labs ordered are listed, but only abnormal results are displayed) Labs Reviewed  RESP PANEL BY RT-PCR (FLU A&B, COVID) [id number]  COMPREHENSIVE METABOLIC PANEL  CBC WITH DIFFERENTIAL/PLATELET  SEDIMENTATION RATE  C-REACTIVE PROTEIN  QUANTIFERON-TB GOLD PLUS  I-STAT VENOUS BLOOD GAS, ED   EKG None  Radiology No results found.  Procedures Procedures    Medications Ordered in ED Medications - No data to display  ED Course/ Medical Decision Making/ A&P Clinical Course as of 11/10/22 1509  Sun Nov 10, 2022  1504 PMH HIV on biktarvy here w/ shortness of breath. Concern for lymphoma. Mass effect on neck. CT soft tissue neck, CT CAP ordered. Labs ordered. [AW]    Clinical Course User Index [AW] Luster Landsberg, MD                           Medical Decision Making Amount and/or Complexity of Data Reviewed Labs: ordered. Radiology: ordered.  Care of patient handed off to Dr. Ernie Hew, ED resident at this time. Patient is pending CT soft tissue neck and CT CAP. Will need completed lab work up as well. Disposition pending imaging. If his symptoms are not improving may be able to admit for observation.  Ordered inflammatory markers, TB to help r/o scrofulus although likely lymphoma vs sarcoma in the setting of HIV/AIDS Final Clinical Impression(s) / ED Diagnoses Final diagnoses:  None    Rx / DC Orders ED Discharge Orders     None         [name], PA-C 11/10/22 1509    Elgie Congo,  MD 11/10/22 1559

## 2022-11-11 ENCOUNTER — Encounter (HOSPITAL_COMMUNITY): Payer: Self-pay | Admitting: Internal Medicine

## 2022-11-11 ENCOUNTER — Other Ambulatory Visit: Payer: Self-pay

## 2022-11-11 ENCOUNTER — Other Ambulatory Visit (HOSPITAL_COMMUNITY): Payer: Self-pay

## 2022-11-11 DIAGNOSIS — R222 Localized swelling, mass and lump, trunk: Secondary | ICD-10-CM

## 2022-11-11 DIAGNOSIS — B20 Human immunodeficiency virus [HIV] disease: Secondary | ICD-10-CM

## 2022-11-11 MED ORDER — TRAMADOL HCL 50 MG PO TABS
100.0000 mg | ORAL_TABLET | Freq: Four times a day (QID) | ORAL | Status: DC
  Administered 2022-11-11 – 2022-11-18 (×20): 100 mg via ORAL
  Filled 2022-11-11 (×24): qty 2

## 2022-11-11 MED ORDER — BIKTARVY 50-200-25 MG PO TABS
1.0000 | ORAL_TABLET | Freq: Every day | ORAL | 5 refills | Status: DC
  Filled 2022-11-11: qty 30, 30d supply, fill #0

## 2022-11-11 NOTE — Consult Note (Signed)
Reason for Consult: chest wall and neck swelling  HPI:  Logan Ward is an 41 y.o. male with HIV disease, CD 4 count of 500. He has had ongoing submental skin infection and chest wall mass where he was hospitalized on 8/21 at [facility] and treated with 3 wk of moxifloxacin plus linezolid which seem to improve his symptoms but not completely. Of late, his symptoms have worsened over the course of the last month, especially in the last week. No fever, chills, nightsweats. Increased sensitivity to the mass on chest wall from clothing rubbing against it.  CT today shows significant swelling of the anterior chest and necrotic soft tissue in the left neck.  Past Medical History:  Diagnosis Date   Anal fissure    HIV infection ([place])    Internal hemorrhoids    Obesity    Rectal ulcer     History reviewed. No pertinent surgical history.  Family History  Problem Relation Age of Onset   Hypertension Father    Throat cancer Father    Heart attack Brother    Colon cancer Neg Hx    Esophageal cancer Neg Hx    Stomach cancer Neg Hx     Social History:  reports that he has never smoked. He has never used smokeless tobacco. He reports that he does not drink alcohol and does not use drugs.  Allergies: No Known Allergies  Prior to Admission medications   Medication Sig Start Date End Date Taking? Authorizing Provider  sulfamethoxazole-trimethoprim (BACTRIM DS) 800-160 MG tablet Take 1 tablet by mouth daily. 09/05/22  Yes [place] Callas, NP  bictegravir-emtricitabine-tenofovir AF (BIKTARVY) 50-200-25 MG TABS tablet Take 1 tablet by mouth daily. 11/11/22   [place] Circle, FNP    Medications: I have reviewed the patient's current medications. Scheduled:  bictegravir-emtricitabine-tenofovir AF  1 tablet Oral Daily   sulfamethoxazole-trimethoprim  1 tablet Oral Daily   traMADol  100 mg Oral Q6H   Continuous: IRS:[id number] **OR** acetaminophen, iohexol, ondansetron **OR** ondansetron  (ZOFRAN) IV  Results for orders placed or performed during the hospital encounter of 11/10/22 (from the past 48 hour(s))  Comprehensive metabolic panel     Status: None   Collection Time: 11/10/22  1:53 PM  Result Value Ref Range   Sodium 138 135 - 145 mmol/L   Potassium 4.4 3.5 - 5.1 mmol/L    Comment: HEMOLYSIS AT THIS LEVEL MAY AFFECT RESULT   Chloride 103 98 - 111 mmol/L   CO2 22 22 - 32 mmol/L   Glucose, Bld 95 70 - 99 mg/dL    Comment: Glucose reference range applies only to samples taken after fasting for at least 8 hours.   BUN 8 6 - 20 mg/dL   Creatinine, Ser 1.11 0.61 - 1.24 mg/dL   Calcium 9.1 8.9 - 10.3 mg/dL   Total Protein 7.3 6.5 - 8.1 g/dL   Albumin 3.9 3.5 - 5.0 g/dL   AST 31 15 - 41 U/L    Comment: HEMOLYSIS AT THIS LEVEL MAY AFFECT RESULT   ALT 14 0 - 44 U/L    Comment: HEMOLYSIS AT THIS LEVEL MAY AFFECT RESULT   Alkaline Phosphatase 54 38 - 126 U/L   Total Bilirubin 1.2 0.3 - 1.2 mg/dL    Comment: HEMOLYSIS AT THIS LEVEL MAY AFFECT RESULT   GFR, Estimated >60 >60 mL/min    Comment: (NOTE) Calculated using the CKD-EPI Creatinine Equation (2021)    Anion gap 13 5 - 15    Comment:  Performed at [facility], [place] [address], [place], [place] [address]  CBC with Differential     Status: None   Collection Time: 11/10/22  1:53 PM  Result Value Ref Range   WBC 4.8 4.0 - 10.5 K/uL   RBC 4.90 4.22 - 5.81 MIL/uL   Hemoglobin 13.4 13.0 - 17.0 g/dL   HCT 42.2 39.0 - 52.0 %   MCV 86.1 80.0 - 100.0 fL   MCH 27.3 26.0 - 34.0 pg   MCHC 31.8 30.0 - 36.0 g/dL   RDW 14.3 11.5 - 15.5 %   Platelets 185 150 - 400 K/uL   nRBC 0.0 0.0 - 0.2 %   Neutrophils Relative % 36 %   Neutro Abs 1.7 1.7 - 7.7 K/uL   Lymphocytes Relative 53 %   Lymphs Abs 2.5 0.7 - 4.0 K/uL   Monocytes Relative 10 %   Monocytes Absolute 0.5 0.1 - 1.0 K/uL   Eosinophils Relative 1 %   Eosinophils Absolute 0.1 0.0 - 0.5 K/uL   Basophils Relative 0 %   Basophils Absolute 0.0 0.0 - 0.1 K/uL    Immature Granulocytes 0 %   Abs Immature Granulocytes 0.01 0.00 - 0.07 K/uL    Comment: Performed at [facility], [address] [place], [place] [address]  Sedimentation rate     Status: None   Collection Time: 11/10/22  1:53 PM  Result Value Ref Range   Sed Rate 10 0 - 16 mm/hr    Comment: Performed at [facility] [address] [place], [place] [address]  Resp Panel by RT-PCR (Flu A&B, Covid) Anterior Nasal Swab     Status: None   Collection Time: 11/10/22  2:03 PM   Specimen: Anterior Nasal Swab  Result Value Ref Range   SARS Coronavirus 2 by RT PCR NEGATIVE NEGATIVE    Comment: (NOTE) SARS-CoV-2 target nucleic acids are NOT DETECTED.  The SARS-CoV-2 RNA is generally detectable in upper respiratory specimens during the acute phase of infection. The lowest concentration of SARS-CoV-2 viral copies this assay can detect is 138 copies/mL. A negative result does not preclude SARS-Cov-2 infection and should not be used as the sole basis for treatment or other patient management decisions. A negative result may occur with  improper specimen collection/handling, submission of specimen other than nasopharyngeal swab, presence of viral mutation(s) within the areas targeted by this assay, and inadequate number of viral copies(<138 copies/mL). A negative result must be combined with clinical observations, patient history, and epidemiological information. The expected result is Negative.  Fact Sheet for Patients:  EntrepreneurPulse.com.au  Fact Sheet for Healthcare Providers:  IncredibleEmployment.be  This test is no t yet approved or cleared by the [place] FDA and  has been authorized for detection and/or diagnosis of SARS-CoV-2 by FDA under an Emergency Use Authorization (EUA). This EUA will remain  in effect (meaning this test can be used) for the duration of the COVID-19 declaration under Section 564(b)(1) of the Act,  21 U.S.C.section 360bbb-3(b)(1), unless the authorization is terminated  or revoked sooner.       Influenza A by PCR NEGATIVE NEGATIVE   Influenza B by PCR NEGATIVE NEGATIVE    Comment: (NOTE) The Xpert Xpress SARS-CoV-2/FLU/RSV plus assay is intended as an aid in the diagnosis of influenza from Nasopharyngeal swab specimens and should not be used as a sole basis for treatment. Nasal washings and aspirates are unacceptable for Xpert Xpress SARS-CoV-2/FLU/RSV testing.  Fact Sheet for  Patients: EntrepreneurPulse.com.au  Fact Sheet for Healthcare Providers: IncredibleEmployment.be  This test is not yet approved or cleared by the [place] FDA and has been authorized for detection and/or diagnosis of SARS-CoV-2 by FDA under an Emergency Use Authorization (EUA). This EUA will remain in effect (meaning this test can be used) for the duration of the COVID-19 declaration under Section 564(b)(1) of the Act, 21 U.S.C. section 360bbb-3(b)(1), unless the authorization is terminated or revoked.  Performed at [facility], [place] [address] [place], [place] [address]   C-reactive protein     Status: Abnormal   Collection Time: 11/10/22  3:00 PM  Result Value Ref Range   CRP 4.2 (H) <1.0 mg/dL    Comment: Performed at [facility], [place] [address], [place], [place] [address]    CT Soft Tissue Neck W Contrast  Result Date: 11/10/2022 CLINICAL DATA:  Neck mass with history of HIV. EXAM: CT NECK WITH CONTRAST TECHNIQUE: Multidetector CT imaging of the neck was performed using the standard protocol following the bolus administration of intravenous contrast. RADIATION DOSE REDUCTION: This exam was performed according to the departmental dose-optimization program which includes automated exposure control, adjustment of the mA and/or kV according to patient size and/or use of iterative reconstruction technique. CONTRAST:  118m OMNIPAQUE IOHEXOL 350  MG/ML SOLN COMPARISON:  CT neck 6 days ago. FINDINGS: Pharynx and larynx: The nasal cavity and nasopharynx are unremarkable. The oral cavity and oropharynx are unremarkable. The parapharyngeal spaces are clear. The hypopharynx and larynx are unremarkable. The vocal folds are normal in appearance. There is no retropharyngeal fluid collection.  The airway is patent. Salivary glands: The parotid and submandibular glands are unremarkable. Thyroid: Unremarkable. Lymph nodes: There is enlarged centrally necrotic right level I lymph node measuring up to 2.1 cm in short axis, similar to the CT from 6 days ago. The large infiltrative centrally necrotic soft tissue mass throughout the right neck measuring up to approximately 8.1 cm TV x 5.9 cm AP by 7.4 cm cc is also not significantly changed in size. Inferior extent into the upper chest anterior to the thyroid with centrally necrotic appearing tissue measuring 4.9 cm x 5.4 cm is unchanged when measured again using similar technique. Overlying skin thickening is similar. The mass is inseparable from the sternocleidomastoid muscle. There are numerous additional prominent right-sided cervical chain lymph nodes measuring up to 1.0 cm at level IIA (3-38), and 1.1 cm in short axis in the posterior triangle (3-43). Vascular: The major vasculature of the neck is unremarkable. Limited intracranial: The imaged portions of the posterior fossa are unremarkable. Visualized orbits: Not included within the field of view. Mastoids and visualized paranasal sinuses: Clear. Skeleton: There is no acute osseous abnormality or suspicious osseous lesion. Upper chest: Assessed on the separately dictated CT chest. Other: None. IMPRESSION: No significant interval change since the CT neck from 6 days prior. Unchanged infiltrative soft tissue mass in the right neck extending to the upper chest anterior to the clavicle with central necrosis and surrounding lymphadenopathy. Differential again includes  malignancy (sarcoma, lymphoma), or infection (including TB. Sampling is recommended. Electronically Signed   By: PValetta MoleM.D.   On: 11/10/2022 16:27   CT CHEST ABDOMEN PELVIS W CONTRAST  Result Date: 11/10/2022 CLINICAL DATA:  Lymphoma, neck mass, metastatic disease evaluation * Tracking Code: BO * EXAM: CT CHEST, ABDOMEN, AND PELVIS WITH CONTRAST TECHNIQUE: Multidetector CT imaging of the chest, abdomen and pelvis was performed following the standard protocol during  bolus administration of intravenous contrast. RADIATION DOSE REDUCTION: This exam was performed according to the departmental dose-optimization program which includes automated exposure control, adjustment of the mA and/or kV according to patient size and/or use of iterative reconstruction technique. CONTRAST:  147m OMNIPAQUE IOHEXOL 350 MG/ML SOLN COMPARISON:  CT neck, 11/04/2022, CT pelvis, 02/09/2017 FINDINGS: CT CHEST FINDINGS Cardiovascular: No significant vascular findings. Normal heart size. No pericardial effusion. Mediastinum/Nodes: Enlarged right lower cervical, supraclavicular, and bilateral axillary lymph nodes, largest right axillary nodes measuring up to 2.2 x 1.7 cm (series 3, image 18). Diffuse fat stranding throughout the superior mediastinum (series 3, image 19), thyroid gland, trachea, and esophagus demonstrate no significant findings. Lungs/Pleura: Lungs are clear. No pleural effusion or pneumothorax. Musculoskeletal: Partially imaged, heterogeneous mass centered in the anterior right neck, incompletely imaged and better assessed by simultaneous dedicated examination of the neck (series 3, image 1). No acute osseous findings. CT ABDOMEN PELVIS FINDINGS Hepatobiliary: No solid liver abnormality is seen. No gallstones, gallbladder wall thickening, or biliary dilatation. Pancreas: Unremarkable. No pancreatic ductal dilatation or surrounding inflammatory changes. Spleen: Normal in size without significant abnormality.  Adrenals/Urinary Tract: Adrenal glands are unremarkable. Kidneys are normal, without renal calculi, solid lesion, or hydronephrosis. Bladder is unremarkable. Stomach/Bowel: Stomach is within normal limits. Appendix appears normal. No evidence of bowel wall thickening, distention, or inflammatory changes. Vascular/Lymphatic: No significant vascular findings are present. Prominent subcentimeter retroperitoneal and iliac lymph nodes, for example a right external iliac node measuring 1.1 x 0.8 cm (series 3, image 102). Reproductive: No mass or other abnormality. Other: No abdominal wall hernia or abnormality. No ascites. Diffuse retroperitoneal fat stranding (series 3, image 80). Musculoskeletal: No acute osseous findings. IMPRESSION: 1. Partially imaged, heterogeneous mass centered in the anterior right neck, incompletely imaged and better assessed by simultaneous dedicated examination of the neck. As previously reported, general differential considerations include both malignancy and infectious process such as scrofula. 2. Enlarged right lower cervical, supraclavicular, and bilateral axillary lymph nodes, which may be reactive although are generally concerning for metastatic disease. 3. Prominent subcentimeter retroperitoneal and iliac lymph nodes, nonspecific although as above concerning for additional nodal metastatic disease. 4. Diffuse fat stranding throughout the superior mediastinum, of uncertain nature, possibly reflecting mediastinal infectious involvement or alternately vascular/lymphatic congestion. Prior thoracic radiotherapy could likewise have this appearance. 5. Diffuse retroperitoneal fat stranding, as above of uncertain significance, possibly infectious or inflammatory, for example incidental and related to pancreatitis, or as above perhaps related to prior abdominal radiotherapy. 6. No mass or evidence of organ metastatic disease in the abdomen or pelvis. Electronically Signed   By: ADelanna AhmadiM.D.    On: 11/10/2022 16:26   DG Chest 2 View  Result Date: 11/10/2022 CLINICAL DATA:  Dyspnea EXAM: CHEST - 2 VIEW COMPARISON:  06/27/2016 FINDINGS: Small areas of consolidation or volume loss identified right middle lobe and lingula. No pneumothorax or pleural effusion. Normal pulmonary vasculature. Unremarkable cardiac silhouette. Unremarkable osseous structures. IMPRESSION: Medial bibasilar subsegmental atelectasis or consolidation. Electronically Signed   By: JSammie BenchM.D.   On: 11/10/2022 14:53    Review of Systems -  Per above, increase swelling to lesions of right side of neck and irght sided chest wall. Otherwise 12 point ros is negative.  Blood pressure 135/79, pulse 87, temperature 98.5 F (36.9 C), temperature source Oral, resp. rate 15, height '6\' 1"'$  (1.854 m), weight 127.7 kg, SpO2 99 %. General appearance: alert and cooperative Head: Normocephalic, without obvious abnormality, atraumatic Eyes: Pupils are equal, round,  reactive to light. Extraocular motion is intact.  Ears: Examination of the ears shows normal auricles and external auditory canals bilaterally.  Nose: Nasal examination shows normal mucosa, septum, turbinates.  Face: Facial examination shows no asymmetry. Palpation of the face elicit no significant tenderness.  Mouth: Oral cavity examination shows no mucosal abnormality. No significant trismus is noted.  Neck: Multiple firm nodular lesions with in neck. Neuro: Cranial nerves 2-12 are all grossly in tact.   Assessment/Plan: Skin and soft tissue infection of the neck and anterior chest wall. -The patient's neck CT scan is reviewed.  The CT showed necrotic soft tissue mass, without drainable abscess.  Based on his history, this is likely infectious in etiology. -Agree with core biopsy by IR, with tissue culture. -IV antibiotic per infectious disease.   -No ENT intervention is recommended at this time.  Aleka Twitty W Kymorah Korf 11/11/2022, 6:24 PM

## 2022-11-11 NOTE — Consult Note (Signed)
[address]       [name] [address]             [phone]                    Logan Ward [place] Medical Record #[id number] Date of Birth: 05-Oct-1981  Referring: No ref. provider found Primary Care: Pcp, No Primary Cardiologist: None  Chief Complaint:    Chief Complaint  Patient presents with   Shortness of Breath    History of Present Illness:    [name] 41 y.o. male w/ HIV of HIV presents with several days of anterior neck, and chest wall pain accompanied by swelling and redness.  He stated that over the past week he has noticed growth over the area on his chest wall.  He only has pain with palpation.    Past Medical History:  Diagnosis Date   Anal fissure    HIV infection ([place])    Internal hemorrhoids    Obesity    Rectal ulcer     History reviewed. No pertinent surgical history.  Family History  Problem Relation Age of Onset   Hypertension Father    Throat cancer Father    Heart attack Brother    Colon cancer Neg Hx    Esophageal cancer Neg Hx    Stomach cancer Neg Hx      Social History   Tobacco Use  Smoking Status Never  Smokeless Tobacco Never    Social History   Substance and Sexual Activity  Alcohol Use No     No Known Allergies  Current Facility-Administered Medications  Medication Dose Route Frequency Provider Last Rate Last Admin   acetaminophen (TYLENOL) tablet 650 mg  650 mg Oral Q6H PRN [name], MD       Or   acetaminophen (TYLENOL) suppository 650 mg  650 mg Rectal Q6H PRN [name], MD       bictegravir-emtricitabine-tenofovir AF (BIKTARVY) 50-200-25 MG per tablet 1 tablet  1 tablet Oral Daily [name], MD   1 tablet at 11/11/22 1041   iohexol (OMNIPAQUE) 350 MG/ML injection 100 mL  100 mL Intravenous Once PRN [name], PA-C       ondansetron (ZOFRAN) tablet 4 mg  4 mg Oral Q6H PRN [name], MD       Or   ondansetron (ZOFRAN) injection 4 mg  4 mg Intravenous Q6H  PRN [name], [name], MD       sulfamethoxazole-trimethoprim (BACTRIM DS) 800-160 MG per tablet 1 tablet  1 tablet Oral Daily [name], MD   1 tablet at 11/11/22 1041   traMADol (ULTRAM) tablet 100 mg  100 mg Oral Q6H [name], [name], MD   100 mg at 11/11/22 1249   Current Outpatient Medications  Medication Sig Dispense Refill   sulfamethoxazole-trimethoprim (BACTRIM DS) 800-160 MG tablet Take 1 tablet by mouth daily. 30 tablet 5   bictegravir-emtricitabine-tenofovir AF (BIKTARVY) 50-200-25 MG TABS tablet Take 1 tablet by mouth daily. 30 tablet 5    Review of Systems  Constitutional:  Negative for chills, fever and malaise/fatigue.  HENT:  Positive for sore throat.   Musculoskeletal:  Positive for neck pain.    PHYSICAL EXAMINATION: BP 113/74   Pulse 80   Temp 98.3 F (36.8 C)   Resp 16   Wt 123.4 kg   SpO2 100%   BMI 35.89 kg/m   Physical Exam Constitutional:  General: He is not in acute distress.    Appearance: He is well-developed.  HENT:     Head:      Mouth/Throat:     Comments: Swelling along the right neck Swelling and induration along the right neck and clavicle. No obvious area of fluctuance Pulmonary:     Effort: Pulmonary effort is normal.  Chest:     Chest wall: Mass present.    Musculoskeletal:        General: Normal range of motion.  Skin:    General: Skin is warm and dry.  Neurological:     General: No focal deficit present.     Mental Status: He is alert and oriented to person, place, and time.      Diagnostic Studies & Laboratory data:     Recent Radiology Findings:   CT Soft Tissue Neck W Contrast  Result Date: 11/10/2022 CLINICAL DATA:  Neck mass with history of HIV. EXAM: CT NECK WITH CONTRAST TECHNIQUE: Multidetector CT imaging of the neck was performed using the standard protocol following the bolus administration of intravenous contrast. RADIATION DOSE REDUCTION: This exam was performed according to the departmental  dose-optimization program which includes automated exposure control, adjustment of the mA and/or kV according to patient size and/or use of iterative reconstruction technique. CONTRAST:  158m OMNIPAQUE IOHEXOL 350 MG/ML SOLN COMPARISON:  CT neck 6 days ago. FINDINGS: Pharynx and larynx: The nasal cavity and nasopharynx are unremarkable. The oral cavity and oropharynx are unremarkable. The parapharyngeal spaces are clear. The hypopharynx and larynx are unremarkable. The vocal folds are normal in appearance. There is no retropharyngeal fluid collection.  The airway is patent. Salivary glands: The parotid and submandibular glands are unremarkable. Thyroid: Unremarkable. Lymph nodes: There is enlarged centrally necrotic right level I lymph node measuring up to 2.1 cm in short axis, similar to the CT from 6 days ago. The large infiltrative centrally necrotic soft tissue mass throughout the right neck measuring up to approximately 8.1 cm TV x 5.9 cm AP by 7.4 cm cc is also not significantly changed in size. Inferior extent into the upper chest anterior to the thyroid with centrally necrotic appearing tissue measuring 4.9 cm x 5.4 cm is unchanged when measured again using similar technique. Overlying skin thickening is similar. The mass is inseparable from the sternocleidomastoid muscle. There are numerous additional prominent right-sided cervical chain lymph nodes measuring up to 1.0 cm at level IIA (3-38), and 1.1 cm in short axis in the posterior triangle (3-43). Vascular: The major vasculature of the neck is unremarkable. Limited intracranial: The imaged portions of the posterior fossa are unremarkable. Visualized orbits: Not included within the field of view. Mastoids and visualized paranasal sinuses: Clear. Skeleton: There is no acute osseous abnormality or suspicious osseous lesion. Upper chest: Assessed on the separately dictated CT chest. Other: None. IMPRESSION: No significant interval change since the CT neck  from 6 days prior. Unchanged infiltrative soft tissue mass in the right neck extending to the upper chest anterior to the clavicle with central necrosis and surrounding lymphadenopathy. Differential again includes malignancy (sarcoma, lymphoma), or infection (including TB. Sampling is recommended. Electronically Signed   By: PValetta MoleM.D.   On: 11/10/2022 16:27   CT CHEST ABDOMEN PELVIS W CONTRAST  Result Date: 11/10/2022 CLINICAL DATA:  Lymphoma, neck mass, metastatic disease evaluation * Tracking Code: BO * EXAM: CT CHEST, ABDOMEN, AND PELVIS WITH CONTRAST TECHNIQUE: Multidetector CT imaging of the chest, abdomen and pelvis was performed following the  standard protocol during bolus administration of intravenous contrast. RADIATION DOSE REDUCTION: This exam was performed according to the departmental dose-optimization program which includes automated exposure control, adjustment of the mA and/or kV according to patient size and/or use of iterative reconstruction technique. CONTRAST:  171m OMNIPAQUE IOHEXOL 350 MG/ML SOLN COMPARISON:  CT neck, 11/04/2022, CT pelvis, 02/09/2017 FINDINGS: CT CHEST FINDINGS Cardiovascular: No significant vascular findings. Normal heart size. No pericardial effusion. Mediastinum/Nodes: Enlarged right lower cervical, supraclavicular, and bilateral axillary lymph nodes, largest right axillary nodes measuring up to 2.2 x 1.7 cm (series 3, image 18). Diffuse fat stranding throughout the superior mediastinum (series 3, image 19), thyroid gland, trachea, and esophagus demonstrate no significant findings. Lungs/Pleura: Lungs are clear. No pleural effusion or pneumothorax. Musculoskeletal: Partially imaged, heterogeneous mass centered in the anterior right neck, incompletely imaged and better assessed by simultaneous dedicated examination of the neck (series 3, image 1). No acute osseous findings. CT ABDOMEN PELVIS FINDINGS Hepatobiliary: No solid liver abnormality is seen. No  gallstones, gallbladder wall thickening, or biliary dilatation. Pancreas: Unremarkable. No pancreatic ductal dilatation or surrounding inflammatory changes. Spleen: Normal in size without significant abnormality. Adrenals/Urinary Tract: Adrenal glands are unremarkable. Kidneys are normal, without renal calculi, solid lesion, or hydronephrosis. Bladder is unremarkable. Stomach/Bowel: Stomach is within normal limits. Appendix appears normal. No evidence of bowel wall thickening, distention, or inflammatory changes. Vascular/Lymphatic: No significant vascular findings are present. Prominent subcentimeter retroperitoneal and iliac lymph nodes, for example a right external iliac node measuring 1.1 x 0.8 cm (series 3, image 102). Reproductive: No mass or other abnormality. Other: No abdominal wall hernia or abnormality. No ascites. Diffuse retroperitoneal fat stranding (series 3, image 80). Musculoskeletal: No acute osseous findings. IMPRESSION: 1. Partially imaged, heterogeneous mass centered in the anterior right neck, incompletely imaged and better assessed by simultaneous dedicated examination of the neck. As previously reported, general differential considerations include both malignancy and infectious process such as scrofula. 2. Enlarged right lower cervical, supraclavicular, and bilateral axillary lymph nodes, which may be reactive although are generally concerning for metastatic disease. 3. Prominent subcentimeter retroperitoneal and iliac lymph nodes, nonspecific although as above concerning for additional nodal metastatic disease. 4. Diffuse fat stranding throughout the superior mediastinum, of uncertain nature, possibly reflecting mediastinal infectious involvement or alternately vascular/lymphatic congestion. Prior thoracic radiotherapy could likewise have this appearance. 5. Diffuse retroperitoneal fat stranding, as above of uncertain significance, possibly infectious or inflammatory, for example incidental  and related to pancreatitis, or as above perhaps related to prior abdominal radiotherapy. 6. No mass or evidence of organ metastatic disease in the abdomen or pelvis. Electronically Signed   By: ADelanna AhmadiM.D.   On: 11/10/2022 16:26   DG Chest 2 View  Result Date: 11/10/2022 CLINICAL DATA:  Dyspnea EXAM: CHEST - 2 VIEW COMPARISON:  06/27/2016 FINDINGS: Small areas of consolidation or volume loss identified right middle lobe and lingula. No pneumothorax or pleural effusion. Normal pulmonary vasculature. Unremarkable cardiac silhouette. Unremarkable osseous structures. IMPRESSION: Medial bibasilar subsegmental atelectasis or consolidation. Electronically Signed   By: JSammie BenchM.D.   On: 11/10/2022 14:53   CT SOFT TISSUE NECK W CONTRAST  Result Date: 11/05/2022 CLINICAL DATA:  Neck mass. EXAM: CT NECK WITH CONTRAST TECHNIQUE: Multidetector CT imaging of the neck was performed using the standard protocol following the bolus administration of intravenous contrast. RADIATION DOSE REDUCTION: This exam was performed according to the departmental dose-optimization program which includes automated exposure control, adjustment of the mA and/or kV according to patient size and/or  use of iterative reconstruction technique. CONTRAST:  37m OMNIPAQUE IOHEXOL 350 MG/ML SOLN COMPARISON:  CT Neck 07/23/22 FINDINGS: Pharynx and larynx: Normal. No mass or swelling. Salivary glands: No inflammation, mass, or stone. Thyroid: Normal. Lymph nodes: There are prominent lymph nodes, for example a 1.2 cm level 1 a lymph node, unchanged from prior exam. There is also a prominent 8 mm right level 3 lymph, slightly increased in size from prior exam. Along the inferior aspect of the right submandibular gland there is a 2.8 x 1.8 cm lesion with central hypodensity (series 3, image 56) which could represent a centrally necrotic lymph node. Vascular: There is mass effect on the right internal jugular vein (series 3, image 68).  Limited intracranial: Negative. Visualized orbits: Negative. Mastoids and visualized paranasal sinuses: Clear. Skeleton: No acute or aggressive process. Upper chest: See below.  No focal pulmonary nodule is visualized. Other: There is marked interval worsening in the degree soft tissue abnormality seen in the right neck compared to prior exam dated 07/23/2022. There is now a large soft tissue mass along the anterior chest wall, adjacent to the sternoclavicular joint on the right measuring 4.2 x 3 4 x 5.6 cm (series 3, image 5). There is infiltrative soft tissue surrounding this mass that extends superiorly along the supraclavicular region to the level of the hyoid bone and submandibular region (series 3, image 64). Inferiorly this mass likely now also extends into the mediastinum where there is soft tissue stranding (series 3, image 115). Infiltrative soft tissues also seen along the left supraclavicular region (series 3, image 77), but to a lesser degree than the right. Portions of this mass appear to have a cutaneous component, for example along the right aspect of the chin (series 3, image 59), as well as the portion adjacent to the right sternoclavicular joint (series 3, image 79). IMPRESSION: 1. Marked interval worsening in the degree of soft tissue abnormality seen in the right neck compared to prior exam dated 07/23/2022. There is now a multi spatial infiltrative soft tissue mass involving a large portion of the right neck. Inferiorly this mass extends into the mediastinum and superiorly this mass extends to the submandibular region. Infiltrative soft tissue is also seen along the left supraclavicular region. Portions of this mass appear to have a cutaneous component, for example along the right aspect of the chin, as well as the right sternoclavicular joint. Findings are concerning for malignancy, with differential considerations including lymphoma or sarcoma. Tissue sampling is recommended. Additionally CT  of the chest is also recommended to fully characterize the mediastinal extent of this mass. 2. 2.8 x 1.8 cm lesion with central hypodensity along the inferior aspect of the right submandibular gland could represent a centrally necrotic lymph node. Electronically Signed   By: HMarin RobertsM.D.   On: 11/05/2022 10:29       I have independently reviewed the above radiology studies  and reviewed the findings with the patient.   Recent Lab Findings: Lab Results  Component Value Date   WBC 4.8 11/10/2022   HGB 13.4 11/10/2022   HCT 42.2 11/10/2022   PLT 185 11/10/2022   GLUCOSE 95 11/10/2022   CHOL 104 10/21/2022   TRIG 175 (H) 10/21/2022   HDL 31 (L) 10/21/2022   LDLCALC 48 10/21/2022   ALT 14 11/10/2022   AST 31 11/10/2022   NA 138 11/10/2022   K 4.4 11/10/2022   CL 103 11/10/2022   CREATININE 1.11 11/10/2022   BUN 8 11/10/2022  CO2 22 11/10/2022        Assessment / Plan:   41yo male with HIV and enlarging neck and anterior chest wall mass.  On review of the CT scan, it all appears in continuity.  I am concern that this may be an infection originating in the neck.  ENT input would be appreciated.  I will be available for any assistance required for the anterior chest wall component.        Lajuana Matte 11/11/2022 4:22 PM

## 2022-11-11 NOTE — Progress Notes (Signed)
[facility] Progress Note  Patient: Logan Ward     XIP:[id number]  DOA: 11/10/2022   PCP: Pcp, No       Brief hospital course: Logan Ward is a 41 y.o. male with medical history of HIV and syphilis presents with a rapidly growing mass in the right anterior chest wall.  He states that it has been there for about a month and a half however, for the past week it has grown from the size of a quarter to the size it is now.  It is associated with severe pain.  He states that today he has noticed some shortness of breath which he is also attributing to this mass.  He has not had any cough, sore throat or runny nose.  No complaints of fevers.   Subjective:  Increasing pain in mass in chest.  Assessment and Plan: Principal Problem:   Chest mass -According to the patient, the mass has grown rapidly over the past couple of weeks - is has increased in size since admission - CT scan reveals: 8 cm x 5.9 cm x 7.4 cm soft tissue mass with centralized necrosis extending anterior to the thyroid and into the upper chest with surrounding lymphadenopathy -We will ask for an IR consult for biopsy - will also an ID evaluation as underlying etiology of this mass might be infectious   Active Problems:   AIDS (acquired immune deficiency syndrome) ([place]) -CD4 count in 11/6 was 501 -Continue Biktarvy and Bactrim     History of syphilis       Code Status: Full Code Consultants: ID, IR Level of Care: Level of care: Med-Surg Total time on patient care: 35 DVT prophylaxis:  SCDs Start: 11/10/22 1746     Objective:   Vitals:   11/11/22 0030 11/11/22 0205 11/11/22 0730 11/11/22 1030  BP:  (!) 137/95 116/76   Pulse: 74 75 95 85  Resp: (!) '23 19 19   '$ Temp:  98.4 F (36.9 C) 98.1 F (36.7 C)   TempSrc:  Oral Oral   SpO2: 96% 100% 98% 100%   There were no vitals filed for this visit. Exam: General exam: Appears comfortable  HEENT: oral mucosa moist Respiratory system: Clear to  auscultation.  Chest wall: increasing size of right anterior chest mass - significant erythema and tenderness Cardiovascular system: S1 & S2 heard  Gastrointestinal system: Abdomen soft, non-tender, nondistended. Normal bowel sounds   Extremities: No cyanosis, clubbing or edema Psychiatry:  Mood & affect appropriate.      CBC: Recent Labs  Lab 11/10/22 1353  WBC 4.8  NEUTROABS 1.7  HGB 13.4  HCT 42.2  MCV 86.1  PLT 673   Basic Metabolic Panel: Recent Labs  Lab 11/10/22 1353  NA 138  K 4.4  CL 103  CO2 22  GLUCOSE 95  BUN 8  CREATININE 1.11  CALCIUM 9.1   GFR: CrCl cannot be calculated (Unknown ideal weight.).  Scheduled Meds:  bictegravir-emtricitabine-tenofovir AF  1 tablet Oral Daily   sulfamethoxazole-trimethoprim  1 tablet Oral Daily   traMADol  100 mg Oral Q6H   Continuous Infusions: Imaging and lab data was personally reviewed CT Soft Tissue Neck W Contrast  Result Date: 11/10/2022 CLINICAL DATA:  Neck mass with history of HIV. EXAM: CT NECK WITH CONTRAST TECHNIQUE: Multidetector CT imaging of the neck was performed using the standard protocol following the bolus administration of intravenous contrast. RADIATION DOSE REDUCTION: This exam was performed according to the departmental dose-optimization program  which includes automated exposure control, adjustment of the mA and/or kV according to patient size and/or use of iterative reconstruction technique. CONTRAST:  157m OMNIPAQUE IOHEXOL 350 MG/ML SOLN COMPARISON:  CT neck 6 days ago. FINDINGS: Pharynx and larynx: The nasal cavity and nasopharynx are unremarkable. The oral cavity and oropharynx are unremarkable. The parapharyngeal spaces are clear. The hypopharynx and larynx are unremarkable. The vocal folds are normal in appearance. There is no retropharyngeal fluid collection.  The airway is patent. Salivary glands: The parotid and submandibular glands are unremarkable. Thyroid: Unremarkable. Lymph nodes: There  is enlarged centrally necrotic right level I lymph node measuring up to 2.1 cm in short axis, similar to the CT from 6 days ago. The large infiltrative centrally necrotic soft tissue mass throughout the right neck measuring up to approximately 8.1 cm TV x 5.9 cm AP by 7.4 cm cc is also not significantly changed in size. Inferior extent into the upper chest anterior to the thyroid with centrally necrotic appearing tissue measuring 4.9 cm x 5.4 cm is unchanged when measured again using similar technique. Overlying skin thickening is similar. The mass is inseparable from the sternocleidomastoid muscle. There are numerous additional prominent right-sided cervical chain lymph nodes measuring up to 1.0 cm at level IIA (3-38), and 1.1 cm in short axis in the posterior triangle (3-43). Vascular: The major vasculature of the neck is unremarkable. Limited intracranial: The imaged portions of the posterior fossa are unremarkable. Visualized orbits: Not included within the field of view. Mastoids and visualized paranasal sinuses: Clear. Skeleton: There is no acute osseous abnormality or suspicious osseous lesion. Upper chest: Assessed on the separately dictated CT chest. Other: None. IMPRESSION: No significant interval change since the CT neck from 6 days prior. Unchanged infiltrative soft tissue mass in the right neck extending to the upper chest anterior to the clavicle with central necrosis and surrounding lymphadenopathy. Differential again includes malignancy (sarcoma, lymphoma), or infection (including TB. Sampling is recommended. Electronically Signed   By: PValetta MoleM.D.   On: 11/10/2022 16:27   CT CHEST ABDOMEN PELVIS W CONTRAST  Result Date: 11/10/2022 CLINICAL DATA:  Lymphoma, neck mass, metastatic disease evaluation * Tracking Code: BO * EXAM: CT CHEST, ABDOMEN, AND PELVIS WITH CONTRAST TECHNIQUE: Multidetector CT imaging of the chest, abdomen and pelvis was performed following the standard protocol during  bolus administration of intravenous contrast. RADIATION DOSE REDUCTION: This exam was performed according to the departmental dose-optimization program which includes automated exposure control, adjustment of the mA and/or kV according to patient size and/or use of iterative reconstruction technique. CONTRAST:  1057mOMNIPAQUE IOHEXOL 350 MG/ML SOLN COMPARISON:  CT neck, 11/04/2022, CT pelvis, 02/09/2017 FINDINGS: CT CHEST FINDINGS Cardiovascular: No significant vascular findings. Normal heart size. No pericardial effusion. Mediastinum/Nodes: Enlarged right lower cervical, supraclavicular, and bilateral axillary lymph nodes, largest right axillary nodes measuring up to 2.2 x 1.7 cm (series 3, image 18). Diffuse fat stranding throughout the superior mediastinum (series 3, image 19), thyroid gland, trachea, and esophagus demonstrate no significant findings. Lungs/Pleura: Lungs are clear. No pleural effusion or pneumothorax. Musculoskeletal: Partially imaged, heterogeneous mass centered in the anterior right neck, incompletely imaged and better assessed by simultaneous dedicated examination of the neck (series 3, image 1). No acute osseous findings. CT ABDOMEN PELVIS FINDINGS Hepatobiliary: No solid liver abnormality is seen. No gallstones, gallbladder wall thickening, or biliary dilatation. Pancreas: Unremarkable. No pancreatic ductal dilatation or surrounding inflammatory changes. Spleen: Normal in size without significant abnormality. Adrenals/Urinary Tract: Adrenal glands are unremarkable.  Kidneys are normal, without renal calculi, solid lesion, or hydronephrosis. Bladder is unremarkable. Stomach/Bowel: Stomach is within normal limits. Appendix appears normal. No evidence of bowel wall thickening, distention, or inflammatory changes. Vascular/Lymphatic: No significant vascular findings are present. Prominent subcentimeter retroperitoneal and iliac lymph nodes, for example a right external iliac node measuring 1.1 x  0.8 cm (series 3, image 102). Reproductive: No mass or other abnormality. Other: No abdominal wall hernia or abnormality. No ascites. Diffuse retroperitoneal fat stranding (series 3, image 80). Musculoskeletal: No acute osseous findings. IMPRESSION: 1. Partially imaged, heterogeneous mass centered in the anterior right neck, incompletely imaged and better assessed by simultaneous dedicated examination of the neck. As previously reported, general differential considerations include both malignancy and infectious process such as scrofula. 2. Enlarged right lower cervical, supraclavicular, and bilateral axillary lymph nodes, which may be reactive although are generally concerning for metastatic disease. 3. Prominent subcentimeter retroperitoneal and iliac lymph nodes, nonspecific although as above concerning for additional nodal metastatic disease. 4. Diffuse fat stranding throughout the superior mediastinum, of uncertain nature, possibly reflecting mediastinal infectious involvement or alternately vascular/lymphatic congestion. Prior thoracic radiotherapy could likewise have this appearance. 5. Diffuse retroperitoneal fat stranding, as above of uncertain significance, possibly infectious or inflammatory, for example incidental and related to pancreatitis, or as above perhaps related to prior abdominal radiotherapy. 6. No mass or evidence of organ metastatic disease in the abdomen or pelvis. Electronically Signed   By: Delanna Ahmadi M.D.   On: 11/10/2022 16:26   DG Chest 2 View  Result Date: 11/10/2022 CLINICAL DATA:  Dyspnea EXAM: CHEST - 2 VIEW COMPARISON:  06/27/2016 FINDINGS: Small areas of consolidation or volume loss identified right middle lobe and lingula. No pneumothorax or pleural effusion. Normal pulmonary vasculature. Unremarkable cardiac silhouette. Unremarkable osseous structures. IMPRESSION: Medial bibasilar subsegmental atelectasis or consolidation. Electronically Signed   By: Sammie Bench M.D.    On: 11/10/2022 14:53    LOS: 1 day   Author: [name]  11/11/2022 12:34 PM  To contact [facility]>   Check the care team in [facility] and look for the attending/consulting [place] provider listed  Log into [email or web address] and use [place]'s universal password   Go to> "[facility]"  and find provider  If you still have difficulty reaching the provider, please page the [facility] (Director on Call) for the Hospitalists listed on amion

## 2022-11-11 NOTE — Progress Notes (Signed)
[facility] Progress Note  Patient: Logan Ward     WUJ:[id number]  DOA: 11/10/2022   PCP: Pcp, No       Brief hospital course: ABDIRAHMAN CHITTUM is a 41 y.o. male with medical history of HIV and syphilis presents with a rapidly growing mass in the right anterior chest wall.  He states that it has been there for about a month and a half however, for the past week it has grown from the size of a quarter to the size it is now.  It is associated with severe pain.  He states that today he has noticed some shortness of breath which he is also attributing to this mass.  He has not had any cough, sore throat or runny nose.  No complaints of fevers.   Subjective:  Increasing pain in mass in chest.  Assessment and Plan: Principal Problem:   Chest mass -According to the patient, the mass has grown rapidly over the past couple of weeks - is has increased in size since admission - CT scan reveals: 8 cm x 5.9 cm x 7.4 cm soft tissue mass with centralized necrosis extending anterior to the thyroid and into the upper chest with surrounding lymphadenopathy -We will ask for an IR consult for biopsy - I have asked for an ID evaluation - ID has asked for surgical consult for I and D- I have consulted general surgery   Active Problems:   AIDS (acquired immune deficiency syndrome) ([place]) -CD4 count in 11/6 was 501 -Continue Biktarvy and Bactrim     History of syphilis       Code Status: Full Code Consultants: ID, IR Level of Care: Level of care: Med-Surg Total time on patient care: 35 DVT prophylaxis:  SCDs Start: 11/10/22 1746     Objective:   Vitals:   11/11/22 0030 11/11/22 0205 11/11/22 0730 11/11/22 1030  BP:  (!) 137/95 116/76   Pulse: 74 75 95 85  Resp: (!) '23 19 19   '$ Temp:  98.4 F (36.9 C) 98.1 F (36.7 C)   TempSrc:  Oral Oral   SpO2: 96% 100% 98% 100%   There were no vitals filed for this visit. Exam: General exam: Appears comfortable  HEENT: oral mucosa  moist Respiratory system: Clear to auscultation.  Chest wall: increasing size of right anterior chest mass - significant erythema and tenderness Cardiovascular system: S1 & S2 heard  Gastrointestinal system: Abdomen soft, non-tender, nondistended. Normal bowel sounds   Extremities: No cyanosis, clubbing or edema Psychiatry:  Mood & affect appropriate.      CBC: Recent Labs  Lab 11/10/22 1353  WBC 4.8  NEUTROABS 1.7  HGB 13.4  HCT 42.2  MCV 86.1  PLT 956    Basic Metabolic Panel: Recent Labs  Lab 11/10/22 1353  NA 138  K 4.4  CL 103  CO2 22  GLUCOSE 95  BUN 8  CREATININE 1.11  CALCIUM 9.1    GFR: CrCl cannot be calculated (Unknown ideal weight.).  Scheduled Meds:  bictegravir-emtricitabine-tenofovir AF  1 tablet Oral Daily   sulfamethoxazole-trimethoprim  1 tablet Oral Daily   traMADol  100 mg Oral Q6H   Continuous Infusions: Imaging and lab data was personally reviewed CT Soft Tissue Neck W Contrast  Result Date: 11/10/2022 CLINICAL DATA:  Neck mass with history of HIV. EXAM: CT NECK WITH CONTRAST TECHNIQUE: Multidetector CT imaging of the neck was performed using the standard protocol following the bolus administration of intravenous contrast. RADIATION DOSE  REDUCTION: This exam was performed according to the departmental dose-optimization program which includes automated exposure control, adjustment of the mA and/or kV according to patient size and/or use of iterative reconstruction technique. CONTRAST:  191m OMNIPAQUE IOHEXOL 350 MG/ML SOLN COMPARISON:  CT neck 6 days ago. FINDINGS: Pharynx and larynx: The nasal cavity and nasopharynx are unremarkable. The oral cavity and oropharynx are unremarkable. The parapharyngeal spaces are clear. The hypopharynx and larynx are unremarkable. The vocal folds are normal in appearance. There is no retropharyngeal fluid collection.  The airway is patent. Salivary glands: The parotid and submandibular glands are unremarkable.  Thyroid: Unremarkable. Lymph nodes: There is enlarged centrally necrotic right level I lymph node measuring up to 2.1 cm in short axis, similar to the CT from 6 days ago. The large infiltrative centrally necrotic soft tissue mass throughout the right neck measuring up to approximately 8.1 cm TV x 5.9 cm AP by 7.4 cm cc is also not significantly changed in size. Inferior extent into the upper chest anterior to the thyroid with centrally necrotic appearing tissue measuring 4.9 cm x 5.4 cm is unchanged when measured again using similar technique. Overlying skin thickening is similar. The mass is inseparable from the sternocleidomastoid muscle. There are numerous additional prominent right-sided cervical chain lymph nodes measuring up to 1.0 cm at level IIA (3-38), and 1.1 cm in short axis in the posterior triangle (3-43). Vascular: The major vasculature of the neck is unremarkable. Limited intracranial: The imaged portions of the posterior fossa are unremarkable. Visualized orbits: Not included within the field of view. Mastoids and visualized paranasal sinuses: Clear. Skeleton: There is no acute osseous abnormality or suspicious osseous lesion. Upper chest: Assessed on the separately dictated CT chest. Other: None. IMPRESSION: No significant interval change since the CT neck from 6 days prior. Unchanged infiltrative soft tissue mass in the right neck extending to the upper chest anterior to the clavicle with central necrosis and surrounding lymphadenopathy. Differential again includes malignancy (sarcoma, lymphoma), or infection (including TB. Sampling is recommended. Electronically Signed   By: PValetta MoleM.D.   On: 11/10/2022 16:27   CT CHEST ABDOMEN PELVIS W CONTRAST  Result Date: 11/10/2022 CLINICAL DATA:  Lymphoma, neck mass, metastatic disease evaluation * Tracking Code: BO * EXAM: CT CHEST, ABDOMEN, AND PELVIS WITH CONTRAST TECHNIQUE: Multidetector CT imaging of the chest, abdomen and pelvis was  performed following the standard protocol during bolus administration of intravenous contrast. RADIATION DOSE REDUCTION: This exam was performed according to the departmental dose-optimization program which includes automated exposure control, adjustment of the mA and/or kV according to patient size and/or use of iterative reconstruction technique. CONTRAST:  1066mOMNIPAQUE IOHEXOL 350 MG/ML SOLN COMPARISON:  CT neck, 11/04/2022, CT pelvis, 02/09/2017 FINDINGS: CT CHEST FINDINGS Cardiovascular: No significant vascular findings. Normal heart size. No pericardial effusion. Mediastinum/Nodes: Enlarged right lower cervical, supraclavicular, and bilateral axillary lymph nodes, largest right axillary nodes measuring up to 2.2 x 1.7 cm (series 3, image 18). Diffuse fat stranding throughout the superior mediastinum (series 3, image 19), thyroid gland, trachea, and esophagus demonstrate no significant findings. Lungs/Pleura: Lungs are clear. No pleural effusion or pneumothorax. Musculoskeletal: Partially imaged, heterogeneous mass centered in the anterior right neck, incompletely imaged and better assessed by simultaneous dedicated examination of the neck (series 3, image 1). No acute osseous findings. CT ABDOMEN PELVIS FINDINGS Hepatobiliary: No solid liver abnormality is seen. No gallstones, gallbladder wall thickening, or biliary dilatation. Pancreas: Unremarkable. No pancreatic ductal dilatation or surrounding inflammatory changes. Spleen: Normal  in size without significant abnormality. Adrenals/Urinary Tract: Adrenal glands are unremarkable. Kidneys are normal, without renal calculi, solid lesion, or hydronephrosis. Bladder is unremarkable. Stomach/Bowel: Stomach is within normal limits. Appendix appears normal. No evidence of bowel wall thickening, distention, or inflammatory changes. Vascular/Lymphatic: No significant vascular findings are present. Prominent subcentimeter retroperitoneal and iliac lymph nodes, for  example a right external iliac node measuring 1.1 x 0.8 cm (series 3, image 102). Reproductive: No mass or other abnormality. Other: No abdominal wall hernia or abnormality. No ascites. Diffuse retroperitoneal fat stranding (series 3, image 80). Musculoskeletal: No acute osseous findings. IMPRESSION: 1. Partially imaged, heterogeneous mass centered in the anterior right neck, incompletely imaged and better assessed by simultaneous dedicated examination of the neck. As previously reported, general differential considerations include both malignancy and infectious process such as scrofula. 2. Enlarged right lower cervical, supraclavicular, and bilateral axillary lymph nodes, which may be reactive although are generally concerning for metastatic disease. 3. Prominent subcentimeter retroperitoneal and iliac lymph nodes, nonspecific although as above concerning for additional nodal metastatic disease. 4. Diffuse fat stranding throughout the superior mediastinum, of uncertain nature, possibly reflecting mediastinal infectious involvement or alternately vascular/lymphatic congestion. Prior thoracic radiotherapy could likewise have this appearance. 5. Diffuse retroperitoneal fat stranding, as above of uncertain significance, possibly infectious or inflammatory, for example incidental and related to pancreatitis, or as above perhaps related to prior abdominal radiotherapy. 6. No mass or evidence of organ metastatic disease in the abdomen or pelvis. Electronically Signed   By: Delanna Ahmadi M.D.   On: 11/10/2022 16:26   DG Chest 2 View  Result Date: 11/10/2022 CLINICAL DATA:  Dyspnea EXAM: CHEST - 2 VIEW COMPARISON:  06/27/2016 FINDINGS: Small areas of consolidation or volume loss identified right middle lobe and lingula. No pneumothorax or pleural effusion. Normal pulmonary vasculature. Unremarkable cardiac silhouette. Unremarkable osseous structures. IMPRESSION: Medial bibasilar subsegmental atelectasis or  consolidation. Electronically Signed   By: Sammie Bench M.D.   On: 11/10/2022 14:53    LOS: 1 day   Author: [name]  11/11/2022 12:48 PM  To contact [facility]>   Check the care team in [facility] and look for the attending/consulting [place] provider listed  Log into [email or web address] and use [place]'s universal password   Go to> "[facility]"  and find provider  If you still have difficulty reaching the provider, please page the [facility] (Director on Call) for the Hospitalists listed on amion

## 2022-11-11 NOTE — Consult Note (Signed)
Chief Complaint: Patient was seen in consultation today for right chest wall mass biopsy Chief Complaint  Patient presents with   Shortness of Breath   at the request of [name]   Referring Physician(s): [name]   Supervising Physician: Michaelle Birks  Patient Status: [facility] - ED  History of Present Illness: Logan Ward is a 41 y.o. male with PMHs of  HIV and syphilis with a rapidly growing mass in the right anterior chest wall.   Patient had neck swelling sine August 2023, he was admitted to [place] on 08/05/2022 for neck swelling, diagnosed with cellulitis and myositis of the anterior neck, had ENT and ID consult and placed on linezolid and moxifloxacin. His symptoms improved a little but he started to have progressive swelling and firmness of the manubrium and left chest, CT neck was obtained on 11/21 and plan was to schedule patient for biopsy as outpatient.   Patient presented to ED on 11/10/22 due to SOB, CT neck and CT CAP which showed no significant interval change since the CT neck from 6 days prior as well as enlarged right lower cervical, supraclavicular, bilateral axillary lymph nodes,  retroperitoneal and iliac lymph nodes.   IR was requested for right chest wall mass biopsy.  Case reviewed and approved for US guided right neck/chest wall mass biopsy by Dr. Maryelizabeth Kaufmann.  PA met with patient at bedside who is agreeable to proceed. Also spoke with family via speaker phone at his request.    Past Medical History:  Diagnosis Date   Anal fissure    HIV infection ([place])    Internal hemorrhoids    Obesity    Rectal ulcer     History reviewed. No pertinent surgical history.  Allergies: Patient has no known allergies.  Medications: Prior to Admission medications   Medication Sig Start Date End Date Taking? Authorizing Provider  bictegravir-emtricitabine-tenofovir AF (BIKTARVY) 50-200-25 MG TABS tablet Take 1 tablet by mouth daily.  09/05/22  Yes [facility] Callas, NP  sulfamethoxazole-trimethoprim (BACTRIM DS) 800-160 MG tablet Take 1 tablet by mouth daily. 09/05/22  Yes [place] Callas, NP     Family History  Problem Relation Age of Onset   Hypertension Father    Throat cancer Father    Heart attack Brother    Colon cancer Neg Hx    Esophageal cancer Neg Hx    Stomach cancer Neg Hx     Social History   Socioeconomic History   Marital status: Single    Spouse name: Not on file   Number of children: Not on file   Years of education: Not on file   Highest education level: Not on file  Occupational History   Not on file  Tobacco Use   Smoking status: Never   Smokeless tobacco: Never  Vaping Use   Vaping Use: Never used  Substance and Sexual Activity   Alcohol use: No   Drug use: No   Sexual activity: Yes    Comment: given condoms  Other Topics Concern   Not on file  Social History Narrative   Not on file   Social Determinants of Health   Financial Resource Strain: Low Risk  (10/24/2022)   Overall Financial Resource Strain (CARDIA)    Difficulty of Paying Living Expenses: Not hard at all  Food Insecurity: No Food Insecurity (10/24/2022)   Hunger Vital Sign    Worried About Running Out of Food in the Last Year: Never true  Ran Out of Food in the Last Year: Never true  Transportation Needs: No Transportation Needs (10/24/2022)   PRAPARE - Hydrologist (Medical): No    Lack of Transportation (Non-Medical): No  Physical Activity: Sufficiently Active (10/24/2022)   Exercise Vital Sign    Days of Exercise per Week: 3 days    Minutes of Exercise per Session: 60 min  Recent Concern: Physical Activity - Insufficiently Active (10/24/2022)   Exercise Vital Sign    Days of Exercise per Week: 3 days    Minutes of Exercise per Session: 20 min  Stress: No Stress Concern Present (10/24/2022)   [place]     Feeling of Stress : Not at all  Social Connections: Unknown (10/24/2022)   Social Connection and Isolation Panel [NHANES]    Frequency of Communication with Friends and Family: Twice a week    Frequency of Social Gatherings with Friends and Family: Twice a week    Attends Religious Services: 1 to 4 times per year    Active Member of [employer] or Organizations: No    Attends Archivist Meetings: 1 to 4 times per year    Marital Status: Patient refused     Review of Systems: A 12 point ROS discussed and pertinent positives are indicated in the HPI above.  All other systems are negative.  Vital Signs: BP 113/74   Pulse 80   Temp 98.3 F (36.8 C)   Resp 16   SpO2 100%    Physical Exam Vitals reviewed.  Constitutional:      General: He is not in acute distress.    Appearance: He is well-developed.  Cardiovascular:     Rate and Rhythm: Normal rate and regular rhythm.  Pulmonary:     Effort: Pulmonary effort is normal.     Breath sounds: Normal breath sounds.  Musculoskeletal:     Cervical back: Normal range of motion and neck supple. Tenderness (large palpable mass at the right neck base/manubrium) present.  Lymphadenopathy:     Cervical: Cervical adenopathy present.  Skin:    General: Skin is warm and dry.  Neurological:     General: No focal deficit present.     Mental Status: He is alert and oriented to person, place, and time. Mental status is at baseline.  Psychiatric:        Mood and Affect: Mood normal.        Behavior: Behavior normal.        Thought Content: Thought content normal.        Judgment: Judgment normal.     MD Evaluation Airway: WNL Heart: WNL Abdomen: WNL Chest/ Lungs: WNL ASA  Classification: 3 Mallampati/Airway Score: Two  Imaging: CT Soft Tissue Neck W Contrast  Result Date: 11/10/2022 CLINICAL DATA:  Neck mass with history of HIV. EXAM: CT NECK WITH CONTRAST TECHNIQUE: Multidetector CT imaging of the neck was performed using the  standard protocol following the bolus administration of intravenous contrast. RADIATION DOSE REDUCTION: This exam was performed according to the departmental dose-optimization program which includes automated exposure control, adjustment of the mA and/or kV according to patient size and/or use of iterative reconstruction technique. CONTRAST:  125m OMNIPAQUE IOHEXOL 350 MG/ML SOLN COMPARISON:  CT neck 6 days ago. FINDINGS: Pharynx and larynx: The nasal cavity and nasopharynx are unremarkable. The oral cavity and oropharynx are unremarkable. The parapharyngeal spaces are clear. The hypopharynx and larynx are unremarkable. The  vocal folds are normal in appearance. There is no retropharyngeal fluid collection.  The airway is patent. Salivary glands: The parotid and submandibular glands are unremarkable. Thyroid: Unremarkable. Lymph nodes: There is enlarged centrally necrotic right level I lymph node measuring up to 2.1 cm in short axis, similar to the CT from 6 days ago. The large infiltrative centrally necrotic soft tissue mass throughout the right neck measuring up to approximately 8.1 cm TV x 5.9 cm AP by 7.4 cm cc is also not significantly changed in size. Inferior extent into the upper chest anterior to the thyroid with centrally necrotic appearing tissue measuring 4.9 cm x 5.4 cm is unchanged when measured again using similar technique. Overlying skin thickening is similar. The mass is inseparable from the sternocleidomastoid muscle. There are numerous additional prominent right-sided cervical chain lymph nodes measuring up to 1.0 cm at level IIA (3-38), and 1.1 cm in short axis in the posterior triangle (3-43). Vascular: The major vasculature of the neck is unremarkable. Limited intracranial: The imaged portions of the posterior fossa are unremarkable. Visualized orbits: Not included within the field of view. Mastoids and visualized paranasal sinuses: Clear. Skeleton: There is no acute osseous abnormality or  suspicious osseous lesion. Upper chest: Assessed on the separately dictated CT chest. Other: None. IMPRESSION: No significant interval change since the CT neck from 6 days prior. Unchanged infiltrative soft tissue mass in the right neck extending to the upper chest anterior to the clavicle with central necrosis and surrounding lymphadenopathy. Differential again includes malignancy (sarcoma, lymphoma), or infection (including TB. Sampling is recommended. Electronically Signed   By: Valetta Mole M.D.   On: 11/10/2022 16:27   CT CHEST ABDOMEN PELVIS W CONTRAST  Result Date: 11/10/2022 CLINICAL DATA:  Lymphoma, neck mass, metastatic disease evaluation * Tracking Code: BO * EXAM: CT CHEST, ABDOMEN, AND PELVIS WITH CONTRAST TECHNIQUE: Multidetector CT imaging of the chest, abdomen and pelvis was performed following the standard protocol during bolus administration of intravenous contrast. RADIATION DOSE REDUCTION: This exam was performed according to the departmental dose-optimization program which includes automated exposure control, adjustment of the mA and/or kV according to patient size and/or use of iterative reconstruction technique. CONTRAST:  136m OMNIPAQUE IOHEXOL 350 MG/ML SOLN COMPARISON:  CT neck, 11/04/2022, CT pelvis, 02/09/2017 FINDINGS: CT CHEST FINDINGS Cardiovascular: No significant vascular findings. Normal heart size. No pericardial effusion. Mediastinum/Nodes: Enlarged right lower cervical, supraclavicular, and bilateral axillary lymph nodes, largest right axillary nodes measuring up to 2.2 x 1.7 cm (series 3, image 18). Diffuse fat stranding throughout the superior mediastinum (series 3, image 19), thyroid gland, trachea, and esophagus demonstrate no significant findings. Lungs/Pleura: Lungs are clear. No pleural effusion or pneumothorax. Musculoskeletal: Partially imaged, heterogeneous mass centered in the anterior right neck, incompletely imaged and better assessed by simultaneous dedicated  examination of the neck (series 3, image 1). No acute osseous findings. CT ABDOMEN PELVIS FINDINGS Hepatobiliary: No solid liver abnormality is seen. No gallstones, gallbladder wall thickening, or biliary dilatation. Pancreas: Unremarkable. No pancreatic ductal dilatation or surrounding inflammatory changes. Spleen: Normal in size without significant abnormality. Adrenals/Urinary Tract: Adrenal glands are unremarkable. Kidneys are normal, without renal calculi, solid lesion, or hydronephrosis. Bladder is unremarkable. Stomach/Bowel: Stomach is within normal limits. Appendix appears normal. No evidence of bowel wall thickening, distention, or inflammatory changes. Vascular/Lymphatic: No significant vascular findings are present. Prominent subcentimeter retroperitoneal and iliac lymph nodes, for example a right external iliac node measuring 1.1 x 0.8 cm (series 3, image 102). Reproductive: No mass or  other abnormality. Other: No abdominal wall hernia or abnormality. No ascites. Diffuse retroperitoneal fat stranding (series 3, image 80). Musculoskeletal: No acute osseous findings. IMPRESSION: 1. Partially imaged, heterogeneous mass centered in the anterior right neck, incompletely imaged and better assessed by simultaneous dedicated examination of the neck. As previously reported, general differential considerations include both malignancy and infectious process such as scrofula. 2. Enlarged right lower cervical, supraclavicular, and bilateral axillary lymph nodes, which may be reactive although are generally concerning for metastatic disease. 3. Prominent subcentimeter retroperitoneal and iliac lymph nodes, nonspecific although as above concerning for additional nodal metastatic disease. 4. Diffuse fat stranding throughout the superior mediastinum, of uncertain nature, possibly reflecting mediastinal infectious involvement or alternately vascular/lymphatic congestion. Prior thoracic radiotherapy could likewise have  this appearance. 5. Diffuse retroperitoneal fat stranding, as above of uncertain significance, possibly infectious or inflammatory, for example incidental and related to pancreatitis, or as above perhaps related to prior abdominal radiotherapy. 6. No mass or evidence of organ metastatic disease in the abdomen or pelvis. Electronically Signed   By: Delanna Ahmadi M.D.   On: 11/10/2022 16:26   DG Chest 2 View  Result Date: 11/10/2022 CLINICAL DATA:  Dyspnea EXAM: CHEST - 2 VIEW COMPARISON:  06/27/2016 FINDINGS: Small areas of consolidation or volume loss identified right middle lobe and lingula. No pneumothorax or pleural effusion. Normal pulmonary vasculature. Unremarkable cardiac silhouette. Unremarkable osseous structures. IMPRESSION: Medial bibasilar subsegmental atelectasis or consolidation. Electronically Signed   By: Sammie Bench M.D.   On: 11/10/2022 14:53   CT SOFT TISSUE NECK W CONTRAST  Result Date: 11/05/2022 CLINICAL DATA:  Neck mass. EXAM: CT NECK WITH CONTRAST TECHNIQUE: Multidetector CT imaging of the neck was performed using the standard protocol following the bolus administration of intravenous contrast. RADIATION DOSE REDUCTION: This exam was performed according to the departmental dose-optimization program which includes automated exposure control, adjustment of the mA and/or kV according to patient size and/or use of iterative reconstruction technique. CONTRAST:  63m OMNIPAQUE IOHEXOL 350 MG/ML SOLN COMPARISON:  CT Neck 07/23/22 FINDINGS: Pharynx and larynx: Normal. No mass or swelling. Salivary glands: No inflammation, mass, or stone. Thyroid: Normal. Lymph nodes: There are prominent lymph nodes, for example a 1.2 cm level 1 a lymph node, unchanged from prior exam. There is also a prominent 8 mm right level 3 lymph, slightly increased in size from prior exam. Along the inferior aspect of the right submandibular gland there is a 2.8 x 1.8 cm lesion with central hypodensity (series 3,  image 56) which could represent a centrally necrotic lymph node. Vascular: There is mass effect on the right internal jugular vein (series 3, image 68). Limited intracranial: Negative. Visualized orbits: Negative. Mastoids and visualized paranasal sinuses: Clear. Skeleton: No acute or aggressive process. Upper chest: See below.  No focal pulmonary nodule is visualized. Other: There is marked interval worsening in the degree soft tissue abnormality seen in the right neck compared to prior exam dated 07/23/2022. There is now a large soft tissue mass along the anterior chest wall, adjacent to the sternoclavicular joint on the right measuring 4.2 x 3 4 x 5.6 cm (series 3, image 5). There is infiltrative soft tissue surrounding this mass that extends superiorly along the supraclavicular region to the level of the hyoid bone and submandibular region (series 3, image 64). Inferiorly this mass likely now also extends into the mediastinum where there is soft tissue stranding (series 3, image 115). Infiltrative soft tissues also seen along the left supraclavicular region (series  3, image 77), but to a lesser degree than the right. Portions of this mass appear to have a cutaneous component, for example along the right aspect of the chin (series 3, image 59), as well as the portion adjacent to the right sternoclavicular joint (series 3, image 79). IMPRESSION: 1. Marked interval worsening in the degree of soft tissue abnormality seen in the right neck compared to prior exam dated 07/23/2022. There is now a multi spatial infiltrative soft tissue mass involving a large portion of the right neck. Inferiorly this mass extends into the mediastinum and superiorly this mass extends to the submandibular region. Infiltrative soft tissue is also seen along the left supraclavicular region. Portions of this mass appear to have a cutaneous component, for example along the right aspect of the chin, as well as the right sternoclavicular joint.  Findings are concerning for malignancy, with differential considerations including lymphoma or sarcoma. Tissue sampling is recommended. Additionally CT of the chest is also recommended to fully characterize the mediastinal extent of this mass. 2. 2.8 x 1.8 cm lesion with central hypodensity along the inferior aspect of the right submandibular gland could represent a centrally necrotic lymph node. Electronically Signed   By: Marin Roberts M.D.   On: 11/05/2022 10:29    Labs:  CBC: Recent Labs    07/19/22 1143 07/23/22 0918 10/21/22 1535 11/10/22 1353  WBC 3.3* 2.1* 4.0 4.8  HGB 13.3 13.6 13.0* 13.4  HCT 40.3 42.7 40.0 42.2  PLT 155 160 227 185    COAGS: No results for input(s): "INR", "APTT" in the last 8760 hours.  BMP: Recent Labs    07/23/22 0918 10/21/22 1535 11/10/22 1353  NA 139 135 138  K 4.2 4.4 4.4  CL 109 103 103  CO2 _0 GLUCOSE 96 79 95  BUN _1 CALCIUM 9.1 9.2 9.1  CREATININE 0.99 1.05 1.11  GFRNONAA >60  --  >60    LIVER FUNCTION TESTS: Recent Labs    10/21/22 1535 11/10/22 1353  BILITOT 0.6 1.2  AST 25 31  ALT 22 14  ALKPHOS  --  54  PROT 7.5 7.3  ALBUMIN  --  3.9    TUMOR MARKERS: No results for input(s): "AFPTM", "CEA", "CA199", "CHROMGRNA" in the last 8760 hours.  Assessment and Plan: 41 y.o. male with hx HIV who presents with enlarging tender right neck/chest wall mass, he is in need of biopsy.   The procedure is scheduled for Tuesday pending IR/Us schedule.  NPO at MN  Risks and benefits of right neck/chest wall mass was discussed with the patient and/or patient's family including, but not limited to bleeding, infection, damage to adjacent structures or low yield requiring additional tests.  All of the questions were answered and there is agreement to proceed.  Consent signed and in IR.    Thank you for this interesting consult.  I greatly enjoyed meeting RITHIK ODEA and look forward to participating in their care.  A  copy of this report was sent to the requesting provider on this date.  Electronically Signed: Docia Barrier, PA 11/11/2022, 2:19 PM   I spent a total of  40 Minutes   in face to face in clinical consultation, greater than 50% of which was counseling/coordinating care for right neck/chest wall mass biopsy.

## 2022-11-11 NOTE — Progress Notes (Signed)
[facility] Progress Note  Patient: Logan Ward     PYP:[id number]  DOA: 11/10/2022   PCP: Pcp, No       Brief hospital course: Logan Ward is a 41 y.o. male with medical history of HIV and syphilis presents with a rapidly growing mass in the right anterior chest wall.  He states that it has been there for about a month and a half however, for the past week it has grown from the size of a quarter to the size it is now.  It is associated with severe pain.  He states that today he has noticed some shortness of breath which he is also attributing to this mass.  He has not had any cough, sore throat or runny nose.  No complaints of fevers.   Subjective:  Increasing pain in mass in chest.  Assessment and Plan: Principal Problem:   Chest mass -According to the patient, the mass has grown rapidly over the past couple of weeks - is has increased in size since admission - CT scan reveals: 8 cm x 5.9 cm x 7.4 cm soft tissue mass with centralized necrosis extending anterior to the thyroid and into the upper chest with surrounding lymphadenopathy - I have asked for an ID evaluation - ID has asked for surgical consult for I and D - I consulted general surgery who then asked me to consult CT surgery- have consulted CT surgery     Active Problems:   AIDS (acquired immune deficiency syndrome) ([place]) -CD4 count in 11/6 was 501 -Continue Biktarvy and Bactrim     History of syphilis       Code Status: Full Code Consultants: ID, IR Level of Care: Level of care: Med-Surg Total time on patient care: 35 DVT prophylaxis:  SCDs Start: 11/10/22 1746     Objective:   Vitals:   11/11/22 0205 11/11/22 0730 11/11/22 1030 11/11/22 1250  BP: (!) 137/95 116/76  113/74  Pulse: 75 95 85 80  Resp: '19 19  16  '$ Temp: 98.4 F (36.9 C) 98.1 F (36.7 C)  98.3 F (36.8 C)  TempSrc: Oral Oral    SpO2: 100% 98% 100% 100%   There were no vitals filed for this visit. Exam: General exam:  Appears comfortable  HEENT: oral mucosa moist Respiratory system: Clear to auscultation.  Chest wall: increasing size of right anterior chest mass - significant erythema and tenderness Cardiovascular system: S1 & S2 heard  Gastrointestinal system: Abdomen soft, non-tender, nondistended. Normal bowel sounds   Extremities: No cyanosis, clubbing or edema Psychiatry:  Mood & affect appropriate.      CBC: Recent Labs  Lab 11/10/22 1353  WBC 4.8  NEUTROABS 1.7  HGB 13.4  HCT 42.2  MCV 86.1  PLT 245    Basic Metabolic Panel: Recent Labs  Lab 11/10/22 1353  NA 138  K 4.4  CL 103  CO2 22  GLUCOSE 95  BUN 8  CREATININE 1.11  CALCIUM 9.1    GFR: CrCl cannot be calculated (Unknown ideal weight.).  Scheduled Meds:  bictegravir-emtricitabine-tenofovir AF  1 tablet Oral Daily   sulfamethoxazole-trimethoprim  1 tablet Oral Daily   traMADol  100 mg Oral Q6H   Continuous Infusions: Imaging and lab data was personally reviewed CT Soft Tissue Neck W Contrast  Result Date: 11/10/2022 CLINICAL DATA:  Neck mass with history of HIV. EXAM: CT NECK WITH CONTRAST TECHNIQUE: Multidetector CT imaging of the neck was performed using the standard protocol following the  bolus administration of intravenous contrast. RADIATION DOSE REDUCTION: This exam was performed according to the departmental dose-optimization program which includes automated exposure control, adjustment of the mA and/or kV according to patient size and/or use of iterative reconstruction technique. CONTRAST:  117m OMNIPAQUE IOHEXOL 350 MG/ML SOLN COMPARISON:  CT neck 6 days ago. FINDINGS: Pharynx and larynx: The nasal cavity and nasopharynx are unremarkable. The oral cavity and oropharynx are unremarkable. The parapharyngeal spaces are clear. The hypopharynx and larynx are unremarkable. The vocal folds are normal in appearance. There is no retropharyngeal fluid collection.  The airway is patent. Salivary glands: The parotid and  submandibular glands are unremarkable. Thyroid: Unremarkable. Lymph nodes: There is enlarged centrally necrotic right level I lymph node measuring up to 2.1 cm in short axis, similar to the CT from 6 days ago. The large infiltrative centrally necrotic soft tissue mass throughout the right neck measuring up to approximately 8.1 cm TV x 5.9 cm AP by 7.4 cm cc is also not significantly changed in size. Inferior extent into the upper chest anterior to the thyroid with centrally necrotic appearing tissue measuring 4.9 cm x 5.4 cm is unchanged when measured again using similar technique. Overlying skin thickening is similar. The mass is inseparable from the sternocleidomastoid muscle. There are numerous additional prominent right-sided cervical chain lymph nodes measuring up to 1.0 cm at level IIA (3-38), and 1.1 cm in short axis in the posterior triangle (3-43). Vascular: The major vasculature of the neck is unremarkable. Limited intracranial: The imaged portions of the posterior fossa are unremarkable. Visualized orbits: Not included within the field of view. Mastoids and visualized paranasal sinuses: Clear. Skeleton: There is no acute osseous abnormality or suspicious osseous lesion. Upper chest: Assessed on the separately dictated CT chest. Other: None. IMPRESSION: No significant interval change since the CT neck from 6 days prior. Unchanged infiltrative soft tissue mass in the right neck extending to the upper chest anterior to the clavicle with central necrosis and surrounding lymphadenopathy. Differential again includes malignancy (sarcoma, lymphoma), or infection (including TB. Sampling is recommended. Electronically Signed   By: PValetta MoleM.D.   On: 11/10/2022 16:27   CT CHEST ABDOMEN PELVIS W CONTRAST  Result Date: 11/10/2022 CLINICAL DATA:  Lymphoma, neck mass, metastatic disease evaluation * Tracking Code: BO * EXAM: CT CHEST, ABDOMEN, AND PELVIS WITH CONTRAST TECHNIQUE: Multidetector CT imaging of the  chest, abdomen and pelvis was performed following the standard protocol during bolus administration of intravenous contrast. RADIATION DOSE REDUCTION: This exam was performed according to the departmental dose-optimization program which includes automated exposure control, adjustment of the mA and/or kV according to patient size and/or use of iterative reconstruction technique. CONTRAST:  1033mOMNIPAQUE IOHEXOL 350 MG/ML SOLN COMPARISON:  CT neck, 11/04/2022, CT pelvis, 02/09/2017 FINDINGS: CT CHEST FINDINGS Cardiovascular: No significant vascular findings. Normal heart size. No pericardial effusion. Mediastinum/Nodes: Enlarged right lower cervical, supraclavicular, and bilateral axillary lymph nodes, largest right axillary nodes measuring up to 2.2 x 1.7 cm (series 3, image 18). Diffuse fat stranding throughout the superior mediastinum (series 3, image 19), thyroid gland, trachea, and esophagus demonstrate no significant findings. Lungs/Pleura: Lungs are clear. No pleural effusion or pneumothorax. Musculoskeletal: Partially imaged, heterogeneous mass centered in the anterior right neck, incompletely imaged and better assessed by simultaneous dedicated examination of the neck (series 3, image 1). No acute osseous findings. CT ABDOMEN PELVIS FINDINGS Hepatobiliary: No solid liver abnormality is seen. No gallstones, gallbladder wall thickening, or biliary dilatation. Pancreas: Unremarkable. No pancreatic ductal  dilatation or surrounding inflammatory changes. Spleen: Normal in size without significant abnormality. Adrenals/Urinary Tract: Adrenal glands are unremarkable. Kidneys are normal, without renal calculi, solid lesion, or hydronephrosis. Bladder is unremarkable. Stomach/Bowel: Stomach is within normal limits. Appendix appears normal. No evidence of bowel wall thickening, distention, or inflammatory changes. Vascular/Lymphatic: No significant vascular findings are present. Prominent subcentimeter retroperitoneal  and iliac lymph nodes, for example a right external iliac node measuring 1.1 x 0.8 cm (series 3, image 102). Reproductive: No mass or other abnormality. Other: No abdominal wall hernia or abnormality. No ascites. Diffuse retroperitoneal fat stranding (series 3, image 80). Musculoskeletal: No acute osseous findings. IMPRESSION: 1. Partially imaged, heterogeneous mass centered in the anterior right neck, incompletely imaged and better assessed by simultaneous dedicated examination of the neck. As previously reported, general differential considerations include both malignancy and infectious process such as scrofula. 2. Enlarged right lower cervical, supraclavicular, and bilateral axillary lymph nodes, which may be reactive although are generally concerning for metastatic disease. 3. Prominent subcentimeter retroperitoneal and iliac lymph nodes, nonspecific although as above concerning for additional nodal metastatic disease. 4. Diffuse fat stranding throughout the superior mediastinum, of uncertain nature, possibly reflecting mediastinal infectious involvement or alternately vascular/lymphatic congestion. Prior thoracic radiotherapy could likewise have this appearance. 5. Diffuse retroperitoneal fat stranding, as above of uncertain significance, possibly infectious or inflammatory, for example incidental and related to pancreatitis, or as above perhaps related to prior abdominal radiotherapy. 6. No mass or evidence of organ metastatic disease in the abdomen or pelvis. Electronically Signed   By: Delanna Ahmadi M.D.   On: 11/10/2022 16:26   DG Chest 2 View  Result Date: 11/10/2022 CLINICAL DATA:  Dyspnea EXAM: CHEST - 2 VIEW COMPARISON:  06/27/2016 FINDINGS: Small areas of consolidation or volume loss identified right middle lobe and lingula. No pneumothorax or pleural effusion. Normal pulmonary vasculature. Unremarkable cardiac silhouette. Unremarkable osseous structures. IMPRESSION: Medial bibasilar subsegmental  atelectasis or consolidation. Electronically Signed   By: Sammie Bench M.D.   On: 11/10/2022 14:53    LOS: 1 day   Author: [name]  11/11/2022 1:55 PM  To contact [facility]>   Check the care team in [facility] and look for the attending/consulting [facility] provider listed  Log into [email or web address] and use [facility]'s universal password   Go to> "[facility]"  and find provider  If you still have difficulty reaching the provider, please page the [facility] (Director on Call) for the Hospitalists listed on amion

## 2022-11-11 NOTE — ED Provider Notes (Signed)
  Physical Exam  BP 118/72   Pulse 80   Temp 97.7 F (36.5 C)   Resp (!) 25   SpO2 97%   Physical Exam  Procedures  Procedures  ED Course / MDM   Clinical Course as of 11/11/22 0054  Sun Nov 10, 2022  1504 PMH HIV on biktarvy here w/ shortness of breath. Concern for lymphoma. Mass effect on neck. CT soft tissue neck, CT CAP ordered. Labs ordered. [AW]    Clinical Course User Index [AW] Luster Landsberg, MD   Medical Decision Making Amount and/or Complexity of Data Reviewed Labs: ordered. Radiology: ordered.  Risk OTC drugs. Decision regarding hospitalization.  I discussed patient with IR.  They note that if the patient is admitted they will build to perform the biopsy inpatient.  On my reevaluation of the patient he was hemodynamically stable.  He did endorse some continued shortness of breath.  No stridor appreciated.  CT scan showed persistent mass which was noted on prior imaging.  Enlarged lymph nodes noted throughout.  I discussed the patient with the admitting team.  Patient is admitted to the hospitalist service.       Luster Landsberg, MD 11/11/22 7902    Teressa Lower, MD 11/11/22 1328

## 2022-11-11 NOTE — Consult Note (Addendum)
[facility] for [facility]ious [facility]  Total days of antibiotics 1 Reason for Consult:neck and chest wall mass    Referring Physician: rizwan  Principal Problem:   Chest mass Active Problems:   AIDS (acquired immune deficiency syndrome) ([place])   History of syphilis    HPI: Logan Ward is a 41 y.o. male with HIV disease, CD 4 count of 500/VL 324 ( improved in the last 3 months of taking biktarvy). Has had some gaps with insurance coverage. Missed 3 days of biktarvy last week. He is now establishing care over the last 2 months at [facility], previously at [facility] has had ongoing submental SSTI and smaller chest wall mass where he was hospitalized on 8/21 at [facility] and treated for 3 wk of moxifloxacin plus linezolid which seem to improve his symptoms but not completely. Of late, his symptoms have worsened over the course of the last month, especially in the last week. No fever, chills, nightsweats. Increased sensitivity to the mass on chest wall from clothing rubbing against it.  Hospitalist felt the anterior wall lesion had grown since yesterday.  Cd 4 count of 115/VL 54,600 in August 2023  Past Medical History:  Diagnosis Date   Anal fissure    HIV infection ([place])    Internal hemorrhoids    Obesity    Rectal ulcer     Allergies: No Known Allergies   MEDICATIONS:  bictegravir-emtricitabine-tenofovir AF  1 tablet Oral Daily   sulfamethoxazole-trimethoprim  1 tablet Oral Daily   traMADol  100 mg Oral Q6H    Social History   Tobacco Use   Smoking status: Never   Smokeless tobacco: Never  Vaping Use   Vaping Use: Never used  Substance Use Topics   Alcohol use: No   Drug use: No    Family History  Problem Relation Age of Onset   Hypertension Father    Throat cancer Father    Heart attack Brother    Colon cancer Neg Hx    Esophageal cancer Neg Hx    Stomach cancer Neg Hx     Review of Systems -  Per above, increase swelling to lesions of right side of neck  and irght sided chest wall. Otherwise 12 point ros is negative.  OBJECTIVE: Temp:  [97.7 F (36.5 C)-98.4 F (36.9 C)] 98.3 F (36.8 C) (11/27 1250) Pulse Rate:  [74-95] 80 (11/27 1250) Resp:  [12-26] 16 (11/27 1250) BP: (111-137)/(67-106) 113/74 (11/27 1250) SpO2:  [95 %-100 %] 100 % (11/27 1250) Physical Exam  Constitutional: He is oriented to person, place, and time. He appears well-developed and well-nourished. No distress.  HENT:  Mouth/Throat: Oropharynx is clear and moist. No oropharyngeal exudate.  Chest wall. Large firm tender raised lesion measuring 11 x 7cm TTP. No fluctuance Neck: submental firm nodular lesion measuring 9 x2 cm. Smaller anterior neck firm lesion measuring 5.5cm by 2cm. Cardiovascular: Normal rate, regular rhythm and normal heart sounds. Exam reveals no gallop and no friction rub.  No murmur heard.  Pulmonary/Chest: Effort normal and breath sounds normal. No respiratory distress. He has no wheezes.  Abdominal: Soft. Bowel sounds are normal. He exhibits no distension. There is no tenderness.  Lymphadenopathy:  He has no cervical adenopathy.  Neurological: He is alert and oriented to person, place, and time.  Skin: Skin is warm and dry. No rash noted. No erythema.  Psychiatric: He has a normal mood and affect. His behavior is normal.    LABS: Results for  orders placed or performed during the hospital encounter of 11/10/22 (from the past 48 hour(s))  Comprehensive metabolic panel     Status: None   Collection Time: 11/10/22  1:53 PM  Result Value Ref Range   Sodium 138 135 - 145 mmol/L   Potassium 4.4 3.5 - 5.1 mmol/L    Comment: HEMOLYSIS AT THIS LEVEL MAY AFFECT RESULT   Chloride 103 98 - 111 mmol/L   CO2 22 22 - 32 mmol/L   Glucose, Bld 95 70 - 99 mg/dL    Comment: Glucose reference range applies only to samples taken after fasting for at least 8 hours.   BUN 8 6 - 20 mg/dL   Creatinine, Ser 1.11 0.61 - 1.24 mg/dL   Calcium 9.1 8.9 - 10.3 mg/dL    Total Protein 7.3 6.5 - 8.1 g/dL   Albumin 3.9 3.5 - 5.0 g/dL   AST 31 15 - 41 U/L    Comment: HEMOLYSIS AT THIS LEVEL MAY AFFECT RESULT   ALT 14 0 - 44 U/L    Comment: HEMOLYSIS AT THIS LEVEL MAY AFFECT RESULT   Alkaline Phosphatase 54 38 - 126 U/L   Total Bilirubin 1.2 0.3 - 1.2 mg/dL    Comment: HEMOLYSIS AT THIS LEVEL MAY AFFECT RESULT   GFR, Estimated >60 >60 mL/min    Comment: (NOTE) Calculated using the CKD-EPI Creatinine Equation (2021)    Anion gap 13 5 - 15    Comment: Performed at [facility], [place] [address] [place], [place] [address]  CBC with Differential     Status: None   Collection Time: 11/10/22  1:53 PM  Result Value Ref Range   WBC 4.8 4.0 - 10.5 K/uL   RBC 4.90 4.22 - 5.81 MIL/uL   Hemoglobin 13.4 13.0 - 17.0 g/dL   HCT 42.2 39.0 - 52.0 %   MCV 86.1 80.0 - 100.0 fL   MCH 27.3 26.0 - 34.0 pg   MCHC 31.8 30.0 - 36.0 g/dL   RDW 14.3 11.5 - 15.5 %   Platelets 185 150 - 400 K/uL   nRBC 0.0 0.0 - 0.2 %   Neutrophils Relative % 36 %   Neutro Abs 1.7 1.7 - 7.7 K/uL   Lymphocytes Relative 53 %   Lymphs Abs 2.5 0.7 - 4.0 K/uL   Monocytes Relative 10 %   Monocytes Absolute 0.5 0.1 - 1.0 K/uL   Eosinophils Relative 1 %   Eosinophils Absolute 0.1 0.0 - 0.5 K/uL   Basophils Relative 0 %   Basophils Absolute 0.0 0.0 - 0.1 K/uL   Immature Granulocytes 0 %   Abs Immature Granulocytes 0.01 0.00 - 0.07 K/uL    Comment: Performed at [facility], [address] [place], [place] [address]  Sedimentation rate     Status: None   Collection Time: 11/10/22  1:53 PM  Result Value Ref Range   Sed Rate 10 0 - 16 mm/hr    Comment: Performed at [facility] [address] [place], [place] [address]  Resp Panel by RT-PCR (Flu A&B, Covid) Anterior Nasal Swab     Status: None   Collection Time: 11/10/22  2:03 PM   Specimen: Anterior Nasal Swab  Result Value Ref Range   SARS Coronavirus 2 by RT PCR NEGATIVE NEGATIVE    Comment: (NOTE) SARS-CoV-2 target  nucleic acids are NOT DETECTED.  The SARS-CoV-2 RNA is generally detectable in upper respiratory specimens during the acute phase of infection.  The lowest concentration of SARS-CoV-2 viral copies this assay can detect is 138 copies/mL. A negative result does not preclude SARS-Cov-2 infection and should not be used as the sole basis for treatment or other patient management decisions. A negative result may occur with  improper specimen collection/handling, submission of specimen other than nasopharyngeal swab, presence of viral mutation(s) within the areas targeted by this assay, and inadequate number of viral copies(<138 copies/mL). A negative result must be combined with clinical observations, patient history, and epidemiological information. The expected result is Negative.  Fact Sheet for Patients:  EntrepreneurPulse.com.au  Fact Sheet for Healthcare Providers:  IncredibleEmployment.be  This test is no t yet approved or cleared by the [place] FDA and  has been authorized for detection and/or diagnosis of SARS-CoV-2 by FDA under an Emergency Use Authorization (EUA). This EUA will remain  in effect (meaning this test can be used) for the duration of the COVID-19 declaration under Section 564(b)(1) of the Act, 21 U.S.C.section 360bbb-3(b)(1), unless the authorization is terminated  or revoked sooner.       Influenza A by PCR NEGATIVE NEGATIVE   Influenza B by PCR NEGATIVE NEGATIVE    Comment: (NOTE) The Xpert Xpress SARS-CoV-2/FLU/RSV plus assay is intended as an aid in the diagnosis of influenza from Nasopharyngeal swab specimens and should not be used as a sole basis for treatment. Nasal washings and aspirates are unacceptable for Xpert Xpress SARS-CoV-2/FLU/RSV testing.  Fact Sheet for Patients: EntrepreneurPulse.com.au  Fact Sheet for Healthcare Providers: IncredibleEmployment.be  This test  is not yet approved or cleared by the [place] FDA and has been authorized for detection and/or diagnosis of SARS-CoV-2 by FDA under an Emergency Use Authorization (EUA). This EUA will remain in effect (meaning this test can be used) for the duration of the COVID-19 declaration under Section 564(b)(1) of the Act, 21 U.S.C. section 360bbb-3(b)(1), unless the authorization is terminated or revoked.  Performed at [facility], [place] [address], [place], [place] [address]   C-reactive protein     Status: Abnormal   Collection Time: 11/10/22  3:00 PM  Result Value Ref Range   CRP 4.2 (H) <1.0 mg/dL    Comment: Performed at [facility], [place] [address], [place],  [address]    MICRO:  IMAGING:  IMPRESSION: 1. Partially imaged, heterogeneous mass centered in the anterior right neck, incompletely imaged and better assessed by simultaneous dedicated examination of the neck. As previously reported, general differential considerations include both malignancy and infectious process such as scrofula. 2. Enlarged right lower cervical, supraclavicular, and bilateral axillary lymph nodes, which may be reactive although are generally concerning for metastatic disease. 3. Prominent subcentimeter retroperitoneal and iliac lymph nodes, nonspecific although as above concerning for additional nodal metastatic disease. 4. Diffuse fat stranding throughout the superior mediastinum, of uncertain nature, possibly reflecting mediastinal infectious involvement or alternately vascular/lymphatic congestion. Prior thoracic radiotherapy could likewise have this appearance. 5. Diffuse retroperitoneal fat stranding, as above of uncertain significance, possibly infectious or inflammatory, for example incidental and related to pancreatitis, or as above perhaps related to prior abdominal radiotherapy. 6. No mass or evidence of organ metastatic disease in the abdomen  or pelvis.   Assessment/Plan:  41yo M with hiv disease, CD 4 count 500/VL 300. With worsening chest wall lesion with stable (yet large) submental soft tissue mass in the right neck extending to the upper chest anterior to the clavicle with central necrosis and surrounding LN described as possibly infections vs. Inflammatory  vs. Malignancy.  - will hold abtx until biopsy and then start vancomycin and amp/sub (previously thought to be odontogenic)  -if he starts to develop fevers, then we can start abtx before biopsy - please get samples for aerobic/anaerobic, fungal, AFB culture in addition to sufficient tissue specimen for pathology to evaluated for lymphoma  Hiv disease = continue on biktarvy and oi proph bactrim.

## 2022-11-12 ENCOUNTER — Telehealth: Payer: Self-pay

## 2022-11-12 ENCOUNTER — Other Ambulatory Visit (HOSPITAL_COMMUNITY): Payer: Self-pay

## 2022-11-12 ENCOUNTER — Inpatient Hospital Stay (HOSPITAL_COMMUNITY): Payer: 59

## 2022-11-12 DIAGNOSIS — R222 Localized swelling, mass and lump, trunk: Secondary | ICD-10-CM | POA: Diagnosis not present

## 2022-11-12 DIAGNOSIS — R221 Localized swelling, mass and lump, neck: Secondary | ICD-10-CM | POA: Diagnosis not present

## 2022-11-12 LAB — QUANTIFERON-TB GOLD PLUS (RQFGPL)
QuantiFERON Mitogen Value: >10 IU/mL
QuantiFERON Nil Value: 0.08 IU/mL
QuantiFERON TB1 Ag Value: 0.09 IU/mL
QuantiFERON TB2 Ag Value: 0.09 IU/mL

## 2022-11-12 LAB — QUANTIFERON-TB GOLD PLUS: QuantiFERON-TB Gold Plus: NEGATIVE

## 2022-11-12 MED ORDER — MIDAZOLAM HCL 5 MG/5ML IJ SOLN
INTRAMUSCULAR | Status: AC | PRN
  Administered 2022-11-12: .5 mg via INTRAVENOUS

## 2022-11-12 MED ORDER — LIDOCAINE HCL (PF) 1 % IJ SOLN: 5.0000 mL | Freq: Once | INTRAMUSCULAR | Status: DC

## 2022-11-12 MED ORDER — LIDOCAINE HCL (PF) 1 % IJ SOLN
INTRAMUSCULAR | Status: AC
  Administered 2022-11-12: 5 mL via INTRADERMAL
  Filled 2022-11-12: qty 30

## 2022-11-12 MED ORDER — VANCOMYCIN HCL 2000 MG/400ML IV SOLN
2000.0000 mg | Freq: Once | INTRAVENOUS | Status: AC
  Administered 2022-11-12: 2000 mg via INTRAVENOUS
  Filled 2022-11-12: qty 400

## 2022-11-12 MED ORDER — VANCOMYCIN HCL 1500 MG/300ML IV SOLN
1500.0000 mg | Freq: Two times a day (BID) | INTRAVENOUS | Status: DC
  Administered 2022-11-13 – 2022-11-14 (×3): 1500 mg via INTRAVENOUS
  Filled 2022-11-12 (×3): qty 300

## 2022-11-12 MED ORDER — FENTANYL CITRATE (PF) 100 MCG/2ML IJ SOLN
INTRAMUSCULAR | Status: AC
  Filled 2022-11-12: qty 2

## 2022-11-12 MED ORDER — MIDAZOLAM HCL 2 MG/2ML IJ SOLN
INTRAMUSCULAR | Status: AC
  Filled 2022-11-12: qty 4

## 2022-11-12 MED ORDER — FENTANYL CITRATE (PF) 100 MCG/2ML IJ SOLN
INTRAMUSCULAR | Status: AC | PRN
  Administered 2022-11-12: 25 ug via INTRAVENOUS
  Administered 2022-11-12: 50 ug via INTRAVENOUS

## 2022-11-12 MED ORDER — SODIUM CHLORIDE 0.9 % IV SOLN
3.0000 g | Freq: Four times a day (QID) | INTRAVENOUS | Status: DC
  Administered 2022-11-12 – 2022-11-14 (×8): 3 g via INTRAVENOUS
  Filled 2022-11-12 (×8): qty 8

## 2022-11-12 MED ORDER — MIDAZOLAM HCL 2 MG/2ML IJ SOLN
INTRAMUSCULAR | Status: AC | PRN
  Administered 2022-11-12 (×2): 1 mg via INTRAVENOUS

## 2022-11-12 NOTE — Progress Notes (Signed)
[facility]  Date of Admission:  11/10/2022      Total days of antibiotics 0         ASSESSMENT: Logan Ward is a 41 y.o. male here for evaluation and treatment for rapidly growing mass to anterior right chest. CT imaging with submental soft tissue mass in the right neck extending to the upper chest anterior to the clavicle with central necrosis and surrounding lymphadenopathy described to be possibly infectious/inflammatory or malignant.  Imaging reviewed - no contact with underlying bone structures, fortunately. He is slated to have core biopsies today for better characterization of the mass. Plan will be to start IV Vancomycin + Unasyn once we obtain sample so not to confound interpretation. Also recommend AFB smear and cultures, pathology/cytology, fungal cultures and routine bacterial cultures.  CT surgery and ENT have seen - no interventions planned   Continue biktarvy / bactrim prophy - last VL 3 weeks ago 324 after short med lapse with insurance. CD4 up to 500 with a notable rapid reconstitution from a few months back.   PLAN: HOLD antibiotics until core biopsy done Orders in for pathology and micro including AFB smear/culture, fungal culture, routine bacterial cultures and pathology / cytology  Continue bactrim prophylaxis Continue Biktarvy daily for HIV treatment  START Vanco/Unasyn after biopsy obtained   Principal Problem:   Chest mass Active Problems:   AIDS (acquired immune deficiency syndrome) (HCC)   History of syphilis    bictegravir-emtricitabine-tenofovir AF  1 tablet Oral Daily   sulfamethoxazole-trimethoprim  1 tablet Oral Daily   traMADol  100 mg Oral Q6H    SUBJECTIVE: Slight soreness around neck - bed is more comfortable that the ER stretcher.  No other concerns.  Tegaderm over mass feels better as it is quite sensitive    Review of Systems: Review of Systems  Constitutional:  Negative for chills, fever,  malaise/fatigue and weight loss.  Respiratory: Negative.    Cardiovascular: Negative.   Gastrointestinal: Negative.   Genitourinary: Negative.   Musculoskeletal: Negative.   All other systems reviewed and are negative.   No Known Allergies  OBJECTIVE: Vitals:   11/11/22 1755 11/11/22 1920 11/12/22 0541 11/12/22 0750  BP: 135/79 125/80 106/64 122/84  Pulse: 87 83 76 79  Resp: '15 17 17 15  '$ Temp: 98.5 F (36.9 C) 98.8 F (37.1 C) 98.4 F (36.9 C) 98.3 F (36.8 C)  TempSrc: Oral Oral  Oral  SpO2: 99% 100% 98% 97%  Weight: 127.7 kg     Height: '6\' 1"'$  (1.854 m)      Body mass index is 37.14 kg/m.  Physical Exam Constitutional:      Appearance: He is well-developed. He is not ill-appearing.  Cardiovascular:     Rate and Rhythm: Normal rate and regular rhythm.  Pulmonary:     Effort: Pulmonary effort is normal.     Breath sounds: Normal breath sounds.  Chest:     Chest wall: Mass present.  Abdominal:     Palpations: Abdomen is soft.  Skin:    General: Skin is warm and dry.  Neurological:     Mental Status: He is alert and oriented to person, place, and time.     Lab Results Lab Results  Component Value Date   WBC 4.8 11/10/2022   HGB 13.4 11/10/2022   HCT 42.2 11/10/2022   MCV 86.1 11/10/2022   PLT 185 11/10/2022    Lab Results  Component  Value Date   CREATININE 1.11 11/10/2022   BUN 8 11/10/2022   NA 138 11/10/2022   K 4.4 11/10/2022   CL 103 11/10/2022   CO2 22 11/10/2022    Lab Results  Component Value Date   ALT 14 11/10/2022   AST 31 11/10/2022   ALKPHOS 54 11/10/2022   BILITOT 1.2 11/10/2022     Microbiology: Recent Results (from the past 240 hour(s))  Resp Panel by RT-PCR (Flu A&B, Covid) Anterior Nasal Swab     Status: None   Collection Time: 11/10/22  2:03 PM   Specimen: Anterior Nasal Swab  Result Value Ref Range Status   SARS Coronavirus 2 by RT PCR NEGATIVE NEGATIVE Final    Comment: (NOTE) SARS-CoV-2 target nucleic acids are NOT  DETECTED.  The SARS-CoV-2 RNA is generally detectable in upper respiratory specimens during the acute phase of infection. The lowest concentration of SARS-CoV-2 viral copies this assay can detect is 138 copies/mL. A negative result does not preclude SARS-Cov-2 infection and should not be used as the sole basis for treatment or other patient management decisions. A negative result may occur with  improper specimen collection/handling, submission of specimen other than nasopharyngeal swab, presence of viral mutation(s) within the areas targeted by this assay, and inadequate number of viral copies(<138 copies/mL). A negative result must be combined with clinical observations, patient history, and epidemiological information. The expected result is Negative.  Fact Sheet for Patients:  EntrepreneurPulse.com.au  Fact Sheet for Healthcare Providers:  IncredibleEmployment.be  This test is no t yet approved or cleared by the [place] FDA and  has been authorized for detection and/or diagnosis of SARS-CoV-2 by FDA under an Emergency Use Authorization (EUA). This EUA will remain  in effect (meaning this test can be used) for the duration of the COVID-19 declaration under Section 564(b)(1) of the Act, 21 U.S.C.section 360bbb-3(b)(1), unless the authorization is terminated  or revoked sooner.       Influenza A by PCR NEGATIVE NEGATIVE Final   Influenza B by PCR NEGATIVE NEGATIVE Final    Comment: (NOTE) The Xpert Xpress SARS-CoV-2/FLU/RSV plus assay is intended as an aid in the diagnosis of influenza from Nasopharyngeal swab specimens and should not be used as a sole basis for treatment. Nasal washings and aspirates are unacceptable for Xpert Xpress SARS-CoV-2/FLU/RSV testing.  Fact Sheet for Patients: EntrepreneurPulse.com.au  Fact Sheet for Healthcare Providers: IncredibleEmployment.be  This test is not yet  approved or cleared by the [place] FDA and has been authorized for detection and/or diagnosis of SARS-CoV-2 by FDA under an Emergency Use Authorization (EUA). This EUA will remain in effect (meaning this test can be used) for the duration of the COVID-19 declaration under Section 564(b)(1) of the Act, 21 U.S.C. section 360bbb-3(b)(1), unless the authorization is terminated or revoked.  Performed at [facility], [place], [place], [place] [address]      [name], MSN, NP-C [facility] [email or web address] Pager: [phone] Office: [phone] [facility] Main Line: [facility] Communication Welcome

## 2022-11-12 NOTE — Procedures (Signed)
Interventional Radiology Procedure Note  Procedure: US guided biopsy of right neck mass  Indication: Right neck mass  Findings: Please refer to procedural dictation for full description.  Complications: None  EBL: < 10 mL  Miachel Roux, MD [phone]

## 2022-11-12 NOTE — Progress Notes (Signed)
Pharmacy Antibiotic Note  Logan Ward is a 41 y.o. male admitted on 11/10/2022 with a rapidly growing mass to the anterior right chest. He is now s/p IR biopsy of mass today.  Pharmacy has been consulted for vancomycin dosing. WBC WNL and patient afebrile. SCr most recently 1.11 on 11/26 with CrCl > 100 ml/min.   Plan: Vancomycin 2000 mg x 1 then 1500 mg every 12 hours  Predicted AUC 532  Monitor renal function, culture data  Will repeat BMET tomorrow morning  Also on Unasyn 3 gm IV Q 6 hours    Height: '6\' 1"'$  (185.4 cm) Weight: 127.7 kg (281 lb 8.4 oz) IBW/kg (Calculated) : 79.9  Temp (24hrs), Avg:98.5 F (36.9 C), Min:98.3 F (36.8 C), Max:98.8 F (37.1 C)  Recent Labs  Lab 11/10/22 1353  WBC 4.8  CREATININE 1.11    Estimated Creatinine Clearance: 125.1 mL/min (by C-G formula based on SCr of 1.11 mg/dL).    No Known Allergies    Thank you for allowing pharmacy to be a part of this patient's care.  Jimmy Footman, PharmD, BCPS, [facility] Clinical Pharmacist Phone: [phone] 11/12/2022 12:39 PM

## 2022-11-12 NOTE — Progress Notes (Signed)
[facility] Progress Note  Patient: Logan Ward     WGY:[id number]  DOA: 11/10/2022   PCP: Pcp, No       Brief hospital course: ANDREZ LIEURANCE is a 41 y.o. male with medical history of HIV and syphilis presents with a rapidly growing mass in the right anterior chest wall.  He states that it has been there for about a month and a half however, for the past week it has grown from the size of a quarter to the size it is now.  It is associated with severe pain.  He states that today he has noticed some shortness of breath which he is also attributing to this mass.  He has not had any cough, sore throat or runny nose.  No complaints of fevers.   Subjective:  No new complaints.   Assessment and Plan: Principal Problem:   Chest mass -According to the patient, the mass has grown rapidly over the past couple of weeks - is has increased in size since admission - CT scan reveals: 8 cm x 5.9 cm x 7.4 cm soft tissue mass with centralized necrosis extending anterior to the thyroid and into the upper chest with surrounding lymphadenopathy - waiting on biopsy of mass- IR consulted for this - ? Underlying infection ie MAC or fungal- appreciate ID evaluation- holding off on antibiotics and other treatments until biopsy done     Active Problems:   AIDS (acquired immune deficiency syndrome) (HCC) -CD4 count in 11/6 was 501 -Continue Biktarvy and Bactrim    History of syphilis  Obesity Body mass index is 37.14 kg/m.        Code Status: Full Code Consultants: ID, IR Level of Care: Level of care: Med-Surg Total time on patient care: 35 DVT prophylaxis:  SCDs Start: 11/10/22 1746     Objective:   Vitals:   11/11/22 1755 11/11/22 1920 11/12/22 0541 11/12/22 0750  BP: 135/79 125/80 106/64 122/84  Pulse: 87 83 76 79  Resp: _0 Temp: 98.5 F (36.9 C) 98.8 F (37.1 C) 98.4 F (36.9 C) 98.3 F (36.8 C)  TempSrc: Oral Oral  Oral  SpO2: 99% 100% 98% 97%  Weight: 127.7  kg     Height: _1  (1.854 m)      Filed Weights   11/11/22 0730 11/11/22 1755  Weight: 123.4 kg 127.7 kg   Exam: General exam: Appears comfortable  HEENT: oral mucosa moist - firm mass in right neck extending into the right upper chest- erythema noted on chest wall mass with tendernss Respiratory system: Clear to auscultation. Cardiovascular system: S1 & S2 heard  Gastrointestinal system: Abdomen soft, non-tender, nondistended. Normal bowel sounds   Extremities: No cyanosis, clubbing or edema Psychiatry:  Mood & affect appropriate.      CBC: Recent Labs  Lab 11/10/22 1353  WBC 4.8  NEUTROABS 1.7  HGB 13.4  HCT 42.2  MCV 86.1  PLT 779    Basic Metabolic Panel: Recent Labs  Lab 11/10/22 1353  NA 138  K 4.4  CL 103  CO2 22  GLUCOSE 95  BUN 8  CREATININE 1.11  CALCIUM 9.1    GFR: Estimated Creatinine Clearance: 125.1 mL/min (by C-G formula based on SCr of 1.11 mg/dL).  Scheduled Meds:  bictegravir-emtricitabine-tenofovir AF  1 tablet Oral Daily   sulfamethoxazole-trimethoprim  1 tablet Oral Daily   traMADol  100 mg Oral Q6H   Continuous Infusions: Imaging and lab data was personally reviewed  CT Soft Tissue Neck W Contrast  Result Date: 11/10/2022 CLINICAL DATA:  Neck mass with history of HIV. EXAM: CT NECK WITH CONTRAST TECHNIQUE: Multidetector CT imaging of the neck was performed using the standard protocol following the bolus administration of intravenous contrast. RADIATION DOSE REDUCTION: This exam was performed according to the departmental dose-optimization program which includes automated exposure control, adjustment of the mA and/or kV according to patient size and/or use of iterative reconstruction technique. CONTRAST:  134m OMNIPAQUE IOHEXOL 350 MG/ML SOLN COMPARISON:  CT neck 6 days ago. FINDINGS: Pharynx and larynx: The nasal cavity and nasopharynx are unremarkable. The oral cavity and oropharynx are unremarkable. The parapharyngeal spaces are  clear. The hypopharynx and larynx are unremarkable. The vocal folds are normal in appearance. There is no retropharyngeal fluid collection.  The airway is patent. Salivary glands: The parotid and submandibular glands are unremarkable. Thyroid: Unremarkable. Lymph nodes: There is enlarged centrally necrotic right level I lymph node measuring up to 2.1 cm in short axis, similar to the CT from 6 days ago. The large infiltrative centrally necrotic soft tissue mass throughout the right neck measuring up to approximately 8.1 cm TV x 5.9 cm AP by 7.4 cm cc is also not significantly changed in size. Inferior extent into the upper chest anterior to the thyroid with centrally necrotic appearing tissue measuring 4.9 cm x 5.4 cm is unchanged when measured again using similar technique. Overlying skin thickening is similar. The mass is inseparable from the sternocleidomastoid muscle. There are numerous additional prominent right-sided cervical chain lymph nodes measuring up to 1.0 cm at level IIA (3-38), and 1.1 cm in short axis in the posterior triangle (3-43). Vascular: The major vasculature of the neck is unremarkable. Limited intracranial: The imaged portions of the posterior fossa are unremarkable. Visualized orbits: Not included within the field of view. Mastoids and visualized paranasal sinuses: Clear. Skeleton: There is no acute osseous abnormality or suspicious osseous lesion. Upper chest: Assessed on the separately dictated CT chest. Other: None. IMPRESSION: No significant interval change since the CT neck from 6 days prior. Unchanged infiltrative soft tissue mass in the right neck extending to the upper chest anterior to the clavicle with central necrosis and surrounding lymphadenopathy. Differential again includes malignancy (sarcoma, lymphoma), or infection (including TB. Sampling is recommended. Electronically Signed   By: PValetta MoleM.D.   On: 11/10/2022 16:27   CT CHEST ABDOMEN PELVIS W CONTRAST  Result  Date: 11/10/2022 CLINICAL DATA:  Lymphoma, neck mass, metastatic disease evaluation * Tracking Code: BO * EXAM: CT CHEST, ABDOMEN, AND PELVIS WITH CONTRAST TECHNIQUE: Multidetector CT imaging of the chest, abdomen and pelvis was performed following the standard protocol during bolus administration of intravenous contrast. RADIATION DOSE REDUCTION: This exam was performed according to the departmental dose-optimization program which includes automated exposure control, adjustment of the mA and/or kV according to patient size and/or use of iterative reconstruction technique. CONTRAST:  1064mOMNIPAQUE IOHEXOL 350 MG/ML SOLN COMPARISON:  CT neck, 11/04/2022, CT pelvis, 02/09/2017 FINDINGS: CT CHEST FINDINGS Cardiovascular: No significant vascular findings. Normal heart size. No pericardial effusion. Mediastinum/Nodes: Enlarged right lower cervical, supraclavicular, and bilateral axillary lymph nodes, largest right axillary nodes measuring up to 2.2 x 1.7 cm (series 3, image 18). Diffuse fat stranding throughout the superior mediastinum (series 3, image 19), thyroid gland, trachea, and esophagus demonstrate no significant findings. Lungs/Pleura: Lungs are clear. No pleural effusion or pneumothorax. Musculoskeletal: Partially imaged, heterogeneous mass centered in the anterior right neck, incompletely imaged and better assessed  by simultaneous dedicated examination of the neck (series 3, image 1). No acute osseous findings. CT ABDOMEN PELVIS FINDINGS Hepatobiliary: No solid liver abnormality is seen. No gallstones, gallbladder wall thickening, or biliary dilatation. Pancreas: Unremarkable. No pancreatic ductal dilatation or surrounding inflammatory changes. Spleen: Normal in size without significant abnormality. Adrenals/Urinary Tract: Adrenal glands are unremarkable. Kidneys are normal, without renal calculi, solid lesion, or hydronephrosis. Bladder is unremarkable. Stomach/Bowel: Stomach is within normal limits.  Appendix appears normal. No evidence of bowel wall thickening, distention, or inflammatory changes. Vascular/Lymphatic: No significant vascular findings are present. Prominent subcentimeter retroperitoneal and iliac lymph nodes, for example a right external iliac node measuring 1.1 x 0.8 cm (series 3, image 102). Reproductive: No mass or other abnormality. Other: No abdominal wall hernia or abnormality. No ascites. Diffuse retroperitoneal fat stranding (series 3, image 80). Musculoskeletal: No acute osseous findings. IMPRESSION: 1. Partially imaged, heterogeneous mass centered in the anterior right neck, incompletely imaged and better assessed by simultaneous dedicated examination of the neck. As previously reported, general differential considerations include both malignancy and infectious process such as scrofula. 2. Enlarged right lower cervical, supraclavicular, and bilateral axillary lymph nodes, which may be reactive although are generally concerning for metastatic disease. 3. Prominent subcentimeter retroperitoneal and iliac lymph nodes, nonspecific although as above concerning for additional nodal metastatic disease. 4. Diffuse fat stranding throughout the superior mediastinum, of uncertain nature, possibly reflecting mediastinal infectious involvement or alternately vascular/lymphatic congestion. Prior thoracic radiotherapy could likewise have this appearance. 5. Diffuse retroperitoneal fat stranding, as above of uncertain significance, possibly infectious or inflammatory, for example incidental and related to pancreatitis, or as above perhaps related to prior abdominal radiotherapy. 6. No mass or evidence of organ metastatic disease in the abdomen or pelvis. Electronically Signed   By: Delanna Ahmadi M.D.   On: 11/10/2022 16:26   DG Chest 2 View  Result Date: 11/10/2022 CLINICAL DATA:  Dyspnea EXAM: CHEST - 2 VIEW COMPARISON:  06/27/2016 FINDINGS: Small areas of consolidation or volume loss identified  right middle lobe and lingula. No pneumothorax or pleural effusion. Normal pulmonary vasculature. Unremarkable cardiac silhouette. Unremarkable osseous structures. IMPRESSION: Medial bibasilar subsegmental atelectasis or consolidation. Electronically Signed   By: Sammie Bench M.D.   On: 11/10/2022 14:53    LOS: 2 days   Author: [name] Aila Terra  11/12/2022 11:15 AM  To contact [facility]>   Check the care team in [facility] and look for the attending/consulting [place] provider listed  Log into [email or web address] and use [facility] universal password   Go to> "[facility]"  and find provider  If you still have difficulty reaching the provider, please page the [facility] (Director on Call) for the Hospitalists listed on amion

## 2022-11-12 NOTE — Telephone Encounter (Signed)
[facility] Patient Advocate Encounter  Patient's medications have been couriered to [facility] from [facility] and will be picked up .  Biktarvy  Marsden Zaino , CPhT [facility] Phone: [phone] Fax:  [phone]  

## 2022-11-13 DIAGNOSIS — B20 Human immunodeficiency virus [HIV] disease: Secondary | ICD-10-CM

## 2022-11-13 DIAGNOSIS — R222 Localized swelling, mass and lump, trunk: Secondary | ICD-10-CM | POA: Diagnosis not present

## 2022-11-13 DIAGNOSIS — Z8619 Personal history of other infectious and parasitic diseases: Secondary | ICD-10-CM

## 2022-11-13 LAB — BASIC METABOLIC PANEL
Anion gap: 10 (ref 5–15)
BUN: 6 mg/dL (ref 6–20)
CO2: 23 mmol/L (ref 22–32)
Calcium: 8.8 mg/dL — ABNORMAL LOW (ref 8.9–10.3)
Chloride: 99 mmol/L (ref 98–111)
Creatinine, Ser: 1.06 mg/dL (ref 0.61–1.24)
GFR, Estimated: >60 mL/min (ref >60)
Glucose, Bld: 99 mg/dL (ref 70–99)
Potassium: 4.2 mmol/L (ref 3.5–5.1)
Sodium: 132 mmol/L — ABNORMAL LOW (ref 135–145)

## 2022-11-13 LAB — CYTOLOGY - NON PAP

## 2022-11-13 NOTE — Plan of Care (Signed)
0700:Pt resting in bed at beginning of shift, pt alert and oriented x4, pt on room air, pt denies pain at this time, pt independent and ambulatory

## 2022-11-13 NOTE — Progress Notes (Signed)
[facility]  Date of Admission:  11/10/2022      Total days of antibiotics 1         ASSESSMENT: Logan Ward is a 41 y.o. male here for evaluation and treatment for rapidly growing mass to anterior right chest. CT imaging with submental soft tissue mass in the right neck extending to the upper chest anterior to the clavicle with central necrosis and surrounding lymphadenopathy described to be possibly infectious/inflammatory or malignant. Imaging reviewed - no contact with underlying bone structures, fortunately. He is now status post core biopsy of the mass with initiation of vancomycin and Unasyn. AFB smear, cultures, pathology/cytology, fungal cultures are pending. Cultures no growth after less than 24 hours. Pain has improved after the biopsy. Remains afebrile, no leukocytosis. CT surgery and ENT have seen - no interventions planned   Continue biktarvy / bactrim prophy - last VL 3 weeks ago 324 after short med lapse with insurance. CD4 up to 500 with a notable rapid reconstitution from a few months back.  PLAN: Continue vancomycin and Unasyn Follow-up cytology/pathology, bacterial culture, AFB smear/culture, fungal culture Continue bactrim prophylaxis Continue Biktarvy daily for HIV treatment  Can likely discharge tomorrow pending further information from culture   Principal Problem:   Chest mass Active Problems:   AIDS (acquired immune deficiency syndrome) (HCC)   History of syphilis   bictegravir-emtricitabine-tenofovir AF  1 tablet Oral Daily   lidocaine (PF)  5 mL Intradermal Once   sulfamethoxazole-trimethoprim  1 tablet Oral Daily   traMADol  100 mg Oral Q6H    SUBJECTIVE: US guided biopsy of the right neck mass was done yesterday with some improvement in pain Evaluated at bedside laying comfortably in bed. Reported 1 episode of chills when he woke up but denies any fevers, shortness of breath, trouble swallowing or sore  throat.  Review of Systems: Review of Systems  Constitutional:  Positive for chills. Negative for fever and malaise/fatigue.  Respiratory: Negative.    Cardiovascular: Negative.   Gastrointestinal: Negative.   Genitourinary: Negative.   Musculoskeletal: Negative.   All other systems reviewed and are negative.  No Known Allergies  OBJECTIVE: Vitals:   11/12/22 1332 11/12/22 1948 11/13/22 0328 11/13/22 0733  BP: 125/77 (!) 140/82 116/72 119/80  Pulse: 75 83 85 80  Resp: '15 18 16 18  '$ Temp: 97.8 F (36.6 C) 98.6 F (37 C) 98.6 F (37 C) 98.2 F (36.8 C)  TempSrc: Oral Oral Oral Oral  SpO2: 100% 98% 99% 98%  Weight:      Height:       Body mass index is 37.14 kg/m.  Physician Exam General: Pleasant, well-appearing man laying in bed. No acute distress. HEENT: Moist mucous membrane. No tongue swelling. Chest wall: Large right neck/chest wall mass, warm to touch. CV: RRR. No murmurs, rubs, or gallops. No LE edema Pulmonary: Comfortable on room air. Lungs clear to auscultation. No stridor or wheezing.  Abdominal: Soft, nontender, nondistended. Normal bowel sounds. Extremities: Palpable radial and DP pulses. Normal ROM. Skin: Warm and dry. No obvious rash or lesions. Neuro: A&Ox3. Moves all extremities. Normal sensation. No focal deficit. Psych: Normal mood and affect  Lab Results Lab Results  Component Value Date   WBC 4.8 11/10/2022   HGB 13.4 11/10/2022   HCT 42.2 11/10/2022   MCV 86.1 11/10/2022   PLT 185 11/10/2022    Lab Results  Component Value Date   CREATININE 1.06 11/13/2022  BUN 6 11/13/2022   NA 132 (L) 11/13/2022   K 4.2 11/13/2022   CL 99 11/13/2022   CO2 23 11/13/2022    Lab Results  Component Value Date   ALT 14 11/10/2022   AST 31 11/10/2022   ALKPHOS 54 11/10/2022   BILITOT 1.2 11/10/2022     Microbiology: Recent Results (from the past 240 hour(s))  Resp Panel by RT-PCR (Flu A&B, Covid) Anterior Nasal Swab     Status: None    Collection Time: 11/10/22  2:03 PM   Specimen: Anterior Nasal Swab  Result Value Ref Range Status   SARS Coronavirus 2 by RT PCR NEGATIVE NEGATIVE Final    Comment: (NOTE) SARS-CoV-2 target nucleic acids are NOT DETECTED.  The SARS-CoV-2 RNA is generally detectable in upper respiratory specimens during the acute phase of infection. The lowest concentration of SARS-CoV-2 viral copies this assay can detect is 138 copies/mL. A negative result does not preclude SARS-Cov-2 infection and should not be used as the sole basis for treatment or other patient management decisions. A negative result may occur with  improper specimen collection/handling, submission of specimen other than nasopharyngeal swab, presence of viral mutation(s) within the areas targeted by this assay, and inadequate number of viral copies(<138 copies/mL). A negative result must be combined with clinical observations, patient history, and epidemiological information. The expected result is Negative.  Fact Sheet for Patients:  EntrepreneurPulse.com.au  Fact Sheet for Healthcare Providers:  IncredibleEmployment.be  This test is no t yet approved or cleared by the [place] FDA and  has been authorized for detection and/or diagnosis of SARS-CoV-2 by FDA under an Emergency Use Authorization (EUA). This EUA will remain  in effect (meaning this test can be used) for the duration of the COVID-19 declaration under Section 564(b)(1) of the Act, 21 U.S.C.section 360bbb-3(b)(1), unless the authorization is terminated  or revoked sooner.       Influenza A by PCR NEGATIVE NEGATIVE Final   Influenza B by PCR NEGATIVE NEGATIVE Final    Comment: (NOTE) The Xpert Xpress SARS-CoV-2/FLU/RSV plus assay is intended as an aid in the diagnosis of influenza from Nasopharyngeal swab specimens and should not be used as a sole basis for treatment. Nasal washings and aspirates are unacceptable for  Xpert Xpress SARS-CoV-2/FLU/RSV testing.  Fact Sheet for Patients: EntrepreneurPulse.com.au  Fact Sheet for Healthcare Providers: IncredibleEmployment.be  This test is not yet approved or cleared by the [place] FDA and has been authorized for detection and/or diagnosis of SARS-CoV-2 by FDA under an Emergency Use Authorization (EUA). This EUA will remain in effect (meaning this test can be used) for the duration of the COVID-19 declaration under Section 564(b)(1) of the Act, 21 U.S.C. section 360bbb-3(b)(1), unless the authorization is terminated or revoked.  Performed at [facility], [place] [address] [place], [place] [address]   Aerobic/Anaerobic Culture w Gram Stain (surgical/deep wound)     Status: None (Preliminary result)   Collection Time: 11/12/22 12:16 PM   Specimen: Tissue  Result Value Ref Range Status   Specimen Description TISSUE  Final   Special Requests NONE  Final   Gram Stain   Final    NO WBC SEEN NO ORGANISMS SEEN Performed at [facility], [place] [address] [place], [place] [address]    Culture PENDING  Incomplete   Report Status PENDING  Incomplete

## 2022-11-13 NOTE — Progress Notes (Signed)
Subjective: Patient reports improvement in his pain. Abx started yesterday.  Objective: Vital signs in last 24 hours: Temp:  [97.8 F (36.6 C)-98.6 F (37 C)] 98.6 F (37 C) (11/29 0328) Pulse Rate:  [75-85] 85 (11/29 0328) Resp:  [11-28] 16 (11/29 0328) BP: (116-140)/(72-89) 116/72 (11/29 0328) SpO2:  [89 %-100 %] 99 % (11/29 0328)  Physical Exam: General appearance: alert and cooperative Head: Normocephalic, without obvious abnormality, atraumatic Eyes: Pupils are equal, round, reactive to light. Extraocular motion is intact.  Ears: Examination of the ears shows normal auricles and external auditory canals bilaterally.  Nose: Nasal examination shows normal mucosa, septum, turbinates.  Face: Facial examination shows no asymmetry. Palpation of the face elicit no significant tenderness.  Mouth: Oral cavity examination shows no mucosal abnormality. No significant trismus is noted.  Neck: Multiple firm nodular lesions with in neck. Large right anterior chest wall swelling. Neuro: Cranial nerves 2-12 are all grossly in tact.   Recent Labs    11/10/22 1353  WBC 4.8  HGB 13.4  HCT 42.2  PLT 185   Recent Labs    11/10/22 1353  NA 138  K 4.4  CL 103  CO2 22  GLUCOSE 95  BUN 8  CREATININE 1.11  CALCIUM 9.1    Medications: I have reviewed the patient's current medications. Scheduled:  bictegravir-emtricitabine-tenofovir AF  1 tablet Oral Daily   lidocaine (PF)  5 mL Intradermal Once   sulfamethoxazole-trimethoprim  1 tablet Oral Daily   traMADol  100 mg Oral Q6H   Continuous:  ampicillin-sulbactam (UNASYN) IV 3 g (11/13/22 0625)   vancomycin 1,500 mg (11/13/22 0154)   UJW:[id number] **OR** acetaminophen, iohexol, ondansetron **OR** ondansetron (ZOFRAN) IV  Assessment/Plan: Skin and soft tissue infection of the neck and anterior chest wall.  - No airway difficulty. - Awaiting biopsy results. - Abx as per ID. - No ENT intervention needed at this time.   LOS: 3  days   Keturah Yerby W Araf Clugston 11/13/2022, 7:33 AM

## 2022-11-13 NOTE — Progress Notes (Signed)
PROGRESS NOTE    Logan Ward  OIN:[id number] DOB: 1980/12/28 DOA: 11/10/2022 PCP: Pcp, No   Brief Narrative:  41 y.o. male with medical history of HIV/AIDS and syphilis presented with a rapidly growing mass in the right anterior chest wall.  On presentation, CT scan revealed 8 cm x 5.9 cm x 7.4 cm soft tissue mass with centralized necrosis extending anterior to the thyroid and into the upper chest with surrounding lymphadenopathy.  ID/cardiothoracic surgery/ENT/IR were consulted.  He underwent ultrasound-guided biopsy of the right neck mass by IR on 11/12/2022.  IV antibiotics were subsequently started after the biopsy.  Assessment & Plan:   Soft tissue mass in the right neck/chest -Imaging as above. -ID/cardiothoracic surgery/ENT/IR were consulted.  He underwent ultrasound-guided biopsy of the right neck mass by IR on 11/12/2022.  IV antibiotics were subsequently started after the biopsy. -Follow further recommendations from ID.  ENT recommending no ENT intervention needed at this time.  HIV/AIDS -CD4 count on 10/21/2022 was 501 -ID following.  Continue Biktarvy and Bactrim  History of syphilis  Obesity -Outpatient follow-up  DVT prophylaxis: SCDs Code Status: Full Family Communication: None at bedside Disposition Plan: Status is: Inpatient Remains inpatient appropriate because: Of severity of illness  Consultants: ID/CT surgery/ENT/IR  Procedures: As above  Antimicrobials:  Anti-infectives (From admission, onward)    Start     Dose/Rate Route Frequency Ordered Stop   11/13/22 0200  vancomycin (VANCOREADY) IVPB 1500 mg/300 mL        1,500 mg 150 mL/hr over 120 Minutes Intravenous Every 12 hours 11/12/22 1245     11/12/22 1400  vancomycin (VANCOREADY) IVPB 2000 mg/400 mL        2,000 mg 200 mL/hr over 120 Minutes Intravenous  Once 11/12/22 1245 11/12/22 1738   11/12/22 1330  Ampicillin-Sulbactam (UNASYN) 3 g in sodium chloride 0.9 % 100 mL IVPB        3 g 200 mL/hr  over 30 Minutes Intravenous Every 6 hours 11/12/22 1238     11/10/22 1900  bictegravir-emtricitabine-tenofovir AF (BIKTARVY) 50-200-25 MG per tablet 1 tablet        1 tablet Oral Daily 11/10/22 1744     11/10/22 1745  sulfamethoxazole-trimethoprim (BACTRIM DS) 800-160 MG per tablet 1 tablet        1 tablet Oral Daily 11/10/22 1744          Subjective: Patient seen and examined at bedside.  Complains of some right neck/chest wall pain.  No fever, worsening shortness of breath, vomiting reported.  Objective: Vitals:   11/12/22 1332 11/12/22 1948 11/13/22 0328 11/13/22 0733  BP: 125/77 (!) 140/82 116/72 119/80  Pulse: 75 83 85 80  Resp: '15 18 16 18  '$ Temp: 97.8 F (36.6 C) 98.6 F (37 C) 98.6 F (37 C) 98.2 F (36.8 C)  TempSrc: Oral Oral Oral Oral  SpO2: 100% 98% 99% 98%  Weight:      Height:        Intake/Output Summary (Last 24 hours) at 11/13/2022 1043 Last data filed at 11/13/2022 0830 Gross per 24 hour  Intake 2016.59 ml  Output --  Net 2016.59 ml   Filed Weights   11/11/22 0730 11/11/22 1755  Weight: 123.4 kg 127.7 kg    Examination:  General exam: Appears calm and comfortable.  Currently on room air.  No distress. ENT: Right neck/chest wall mass noted. Respiratory system: Bilateral decreased breath sounds at bases with some scattered crackles Cardiovascular system: S1 & S2 heard, Rate controlled  Gastrointestinal system: Abdomen is obese, nondistended, soft and nontender. Normal bowel sounds heard. Extremities: No cyanosis, clubbing, edema   Data Reviewed: I have personally reviewed following labs and imaging studies  CBC: Recent Labs  Lab 11/10/22 1353  WBC 4.8  NEUTROABS 1.7  HGB 13.4  HCT 42.2  MCV 86.1  PLT 614   Basic Metabolic Panel: Recent Labs  Lab 11/10/22 1353 11/13/22 0628  NA 138 132*  K 4.4 4.2  CL 103 99  CO2 22 23  GLUCOSE 95 99  BUN 8 6  CREATININE 1.11 1.06  CALCIUM 9.1 8.8*   GFR: Estimated Creatinine Clearance: 131  mL/min (by C-G formula based on SCr of 1.06 mg/dL). Liver Function Tests: Recent Labs  Lab 11/10/22 1353  AST 31  ALT 14  ALKPHOS 54  BILITOT 1.2  PROT 7.3  ALBUMIN 3.9   No results for input(s): "LIPASE", "AMYLASE" in the last 168 hours. No results for input(s): "AMMONIA" in the last 168 hours. Coagulation Profile: No results for input(s): "INR", "PROTIME" in the last 168 hours. Cardiac Enzymes: No results for input(s): "CKTOTAL", "CKMB", "CKMBINDEX", "TROPONINI" in the last 168 hours. BNP (last 3 results) No results for input(s): "PROBNP" in the last 8760 hours. HbA1C: No results for input(s): "HGBA1C" in the last 72 hours. CBG: No results for input(s): "GLUCAP" in the last 168 hours. Lipid Profile: No results for input(s): "CHOL", "HDL", "LDLCALC", "TRIG", "CHOLHDL", "LDLDIRECT" in the last 72 hours. Thyroid Function Tests: No results for input(s): "TSH", "T4TOTAL", "FREET4", "T3FREE", "THYROIDAB" in the last 72 hours. Anemia Panel: No results for input(s): "VITAMINB12", "FOLATE", "FERRITIN", "TIBC", "IRON", "RETICCTPCT" in the last 72 hours. Sepsis Labs: No results for input(s): "PROCALCITON", "LATICACIDVEN" in the last 168 hours.  Recent Results (from the past 240 hour(s))  Resp Panel by RT-PCR (Flu A&B, Covid) Anterior Nasal Swab     Status: None   Collection Time: 11/10/22  2:03 PM   Specimen: Anterior Nasal Swab  Result Value Ref Range Status   SARS Coronavirus 2 by RT PCR NEGATIVE NEGATIVE Final    Comment: (NOTE) SARS-CoV-2 target nucleic acids are NOT DETECTED.  The SARS-CoV-2 RNA is generally detectable in upper respiratory specimens during the acute phase of infection. The lowest concentration of SARS-CoV-2 viral copies this assay can detect is 138 copies/mL. A negative result does not preclude SARS-Cov-2 infection and should not be used as the sole basis for treatment or other patient management decisions. A negative result may occur with  improper  specimen collection/handling, submission of specimen other than nasopharyngeal swab, presence of viral mutation(s) within the areas targeted by this assay, and inadequate number of viral copies(<138 copies/mL). A negative result must be combined with clinical observations, patient history, and epidemiological information. The expected result is Negative.  Fact Sheet for Patients:  EntrepreneurPulse.com.au  Fact Sheet for Healthcare Providers:  IncredibleEmployment.be  This test is no t yet approved or cleared by the [place] FDA and  has been authorized for detection and/or diagnosis of SARS-CoV-2 by FDA under an Emergency Use Authorization (EUA). This EUA will remain  in effect (meaning this test can be used) for the duration of the COVID-19 declaration under Section 564(b)(1) of the Act, 21 U.S.C.section 360bbb-3(b)(1), unless the authorization is terminated  or revoked sooner.       Influenza A by PCR NEGATIVE NEGATIVE Final   Influenza B by PCR NEGATIVE NEGATIVE Final    Comment: (NOTE) The Xpert Xpress SARS-CoV-2/FLU/RSV plus assay is intended as an  aid in the diagnosis of influenza from Nasopharyngeal swab specimens and should not be used as a sole basis for treatment. Nasal washings and aspirates are unacceptable for Xpert Xpress SARS-CoV-2/FLU/RSV testing.  Fact Sheet for Patients: EntrepreneurPulse.com.au  Fact Sheet for Healthcare Providers: IncredibleEmployment.be  This test is not yet approved or cleared by the [place] FDA and has been authorized for detection and/or diagnosis of SARS-CoV-2 by FDA under an Emergency Use Authorization (EUA). This EUA will remain in effect (meaning this test can be used) for the duration of the COVID-19 declaration under Section 564(b)(1) of the Act, 21 U.S.C. section 360bbb-3(b)(1), unless the authorization is terminated or revoked.  Performed at  [facility], [place] [address] [place], [place] [address]   Aerobic/Anaerobic Culture w Gram Stain (surgical/deep wound)     Status: None (Preliminary result)   Collection Time: 11/12/22 12:16 PM   Specimen: Tissue  Result Value Ref Range Status   Specimen Description TISSUE  Final   Special Requests NONE  Final   Gram Stain   Final    NO WBC SEEN NO ORGANISMS SEEN Performed at [facility], [place] [address], [place], [place] [address]    Culture PENDING  Incomplete   Report Status PENDING  Incomplete         Radiology Studies: [place] CORE BIOPSY (SOFT TISSUE)  Result Date: 11/12/2022 INDICATION: 41 year old gentleman with history of lymphoma presents to IR for biopsy of right neck mass EXAM: Ultrasound-guided biopsy of right neck mass MEDICATIONS: None. ANESTHESIA/SEDATION: Moderate (conscious) sedation was employed during this procedure. A total of Versed 2.5 mg and Fentanyl 75 mcg was administered intravenously. Moderate Sedation Time: 10 minutes. The patient's level of consciousness and vital signs were monitored continuously by radiology nursing throughout the procedure under my direct supervision. COMPLICATIONS: None immediate. PROCEDURE: Informed written consent was obtained from the patient after a thorough discussion of the procedural risks, benefits and alternatives. All questions were addressed. Maximal Sterile Barrier Technique was utilized including caps, mask, sterile gowns, sterile gloves, sterile drape, hand hygiene and skin antiseptic. A timeout was performed prior to the initiation of the procedure. Patient position supine on the ultrasound table. Right neck skin prepped and draped in usual sterile fashion. Following local lidocaine administration, 5- 18 gauge cores were obtained from the right neck mass utilizing continuous ultrasound guidance. Two cores were sent for Gram stain and culture in sterile saline. Three cores were sent for histologic analysis in formalin.  Needle removed and hemostasis achieved with 5 minutes of manual compression. Post procedure ultrasound images showed no evidence of significant hemorrhage. IMPRESSION: Ultrasound-guided biopsy of right neck mass. Electronically Signed   By: Miachel Roux M.D.   On: 11/12/2022 17:17        Scheduled Meds:  bictegravir-emtricitabine-tenofovir AF  1 tablet Oral Daily   lidocaine (PF)  5 mL Intradermal Once   sulfamethoxazole-trimethoprim  1 tablet Oral Daily   traMADol  100 mg Oral Q6H   Continuous Infusions:  ampicillin-sulbactam (UNASYN) IV 3 g (11/13/22 0625)   vancomycin 1,500 mg (11/13/22 0154)          Aline August, MD [facility] 11/13/2022, 10:43 AM

## 2022-11-14 ENCOUNTER — Inpatient Hospital Stay (HOSPITAL_COMMUNITY): Payer: 59

## 2022-11-14 ENCOUNTER — Other Ambulatory Visit (HOSPITAL_COMMUNITY): Payer: Self-pay

## 2022-11-14 DIAGNOSIS — C8378 Burkitt lymphoma, lymph nodes of multiple sites: Secondary | ICD-10-CM | POA: Diagnosis not present

## 2022-11-14 DIAGNOSIS — B20 Human immunodeficiency virus [HIV] disease: Secondary | ICD-10-CM | POA: Diagnosis not present

## 2022-11-14 DIAGNOSIS — Z8619 Personal history of other infectious and parasitic diseases: Secondary | ICD-10-CM | POA: Diagnosis not present

## 2022-11-14 DIAGNOSIS — Z0189 Encounter for other specified special examinations: Secondary | ICD-10-CM | POA: Diagnosis not present

## 2022-11-14 DIAGNOSIS — R222 Localized swelling, mass and lump, trunk: Secondary | ICD-10-CM | POA: Diagnosis not present

## 2022-11-14 DIAGNOSIS — Z7189 Other specified counseling: Secondary | ICD-10-CM

## 2022-11-14 LAB — ECHOCARDIOGRAM COMPLETE
Area-P 1/2: 3.63 cm2
Height: 73 in
MV M vel: 0.97 m/s
MV Peak grad: 3.7 mmHg
S' Lateral: 2.4 cm
Weight: 4504.44 oz

## 2022-11-14 LAB — BASIC METABOLIC PANEL
Anion gap: 11 (ref 5–15)
BUN: 7 mg/dL (ref 6–20)
CO2: 20 mmol/L — ABNORMAL LOW (ref 22–32)
Calcium: 8.9 mg/dL (ref 8.9–10.3)
Chloride: 103 mmol/L (ref 98–111)
Creatinine, Ser: 1.05 mg/dL (ref 0.61–1.24)
GFR, Estimated: >60 mL/min (ref >60)
Glucose, Bld: 97 mg/dL (ref 70–99)
Potassium: 4.3 mmol/L (ref 3.5–5.1)
Sodium: 134 mmol/L — ABNORMAL LOW (ref 135–145)

## 2022-11-14 LAB — ACID FAST SMEAR (AFB, MYCOBACTERIA): Acid Fast Smear: NEGATIVE

## 2022-11-14 LAB — SURGICAL PATHOLOGY

## 2022-11-14 LAB — LACTATE DEHYDROGENASE: LDH: 954 U/L — ABNORMAL HIGH (ref 98–192)

## 2022-11-14 LAB — URIC ACID: Uric Acid, Serum: 9.1 mg/dL — ABNORMAL HIGH (ref 3.7–8.6)

## 2022-11-14 MED ORDER — ALLOPURINOL 100 MG PO TABS
100.0000 mg | ORAL_TABLET | Freq: Two times a day (BID) | ORAL | Status: DC
  Administered 2022-11-14 – 2022-11-22 (×16): 100 mg via ORAL
  Filled 2022-11-14 (×16): qty 1

## 2022-11-14 MED ORDER — BISACODYL 5 MG PO TBEC: 5.0000 mg | DELAYED_RELEASE_TABLET | Freq: Every day | ORAL | Status: DC | PRN

## 2022-11-14 MED ORDER — SENNOSIDES-DOCUSATE SODIUM 8.6-50 MG PO TABS
1.0000 | ORAL_TABLET | Freq: Two times a day (BID) | ORAL | Status: DC
  Administered 2022-11-14 – 2022-11-22 (×10): 1 via ORAL
  Filled 2022-11-14 (×16): qty 1

## 2022-11-14 MED ORDER — POLYETHYLENE GLYCOL 3350 17 G PO PACK
17.0000 g | PACK | Freq: Every day | ORAL | Status: DC
  Administered 2022-11-15 – 2022-11-22 (×4): 17 g via ORAL
  Filled 2022-11-14 (×8): qty 1

## 2022-11-14 MED ORDER — PERFLUTREN LIPID MICROSPHERE
1.0000 mL | INTRAVENOUS | Status: AC | PRN
  Administered 2022-11-14: 4 mL via INTRAVENOUS

## 2022-11-14 MED ORDER — DOXYCYCLINE HYCLATE 100 MG PO CAPS
100.0000 mg | ORAL_CAPSULE | Freq: Two times a day (BID) | ORAL | 0 refills | Status: DC
  Filled 2022-11-14: qty 28, 14d supply, fill #0

## 2022-11-14 MED ORDER — FLEET ENEMA 7-19 GM/118ML RE ENEM: 1.0000 | ENEMA | Freq: Every day | RECTAL | Status: DC | PRN

## 2022-11-14 MED ORDER — DOXYCYCLINE HYCLATE 100 MG PO TABS
100.0000 mg | ORAL_TABLET | Freq: Two times a day (BID) | ORAL | Status: DC
  Administered 2022-11-14: 100 mg via ORAL
  Filled 2022-11-14: qty 1

## 2022-11-14 MED ORDER — AMOXICILLIN-POT CLAVULANATE 875-125 MG PO TABS
1.0000 | ORAL_TABLET | Freq: Two times a day (BID) | ORAL | 0 refills | Status: DC
  Filled 2022-11-14: qty 28, 14d supply, fill #0

## 2022-11-14 MED ORDER — AMOXICILLIN-POT CLAVULANATE 875-125 MG PO TABS
1.0000 | ORAL_TABLET | Freq: Two times a day (BID) | ORAL | Status: DC
  Administered 2022-11-14: 1 via ORAL
  Filled 2022-11-14: qty 1

## 2022-11-14 MED ORDER — DEXAMETHASONE 4 MG PO TABS
6.0000 mg | ORAL_TABLET | Freq: Two times a day (BID) | ORAL | Status: DC
  Administered 2022-11-14 – 2022-11-22 (×15): 6 mg via ORAL
  Filled 2022-11-14 (×6): qty 2
  Filled 2022-11-14 (×2): qty 1
  Filled 2022-11-14 (×3): qty 2
  Filled 2022-11-14: qty 1
  Filled 2022-11-14 (×4): qty 2

## 2022-11-14 MED ORDER — MAGNESIUM HYDROXIDE 400 MG/5ML PO SUSP: 30.0000 mL | Freq: Every day | ORAL | Status: DC | PRN

## 2022-11-14 NOTE — Progress Notes (Signed)
  Echocardiogram 2D Echocardiogram has been performed.  Logan Ward 11/14/2022, 4:51 PM

## 2022-11-14 NOTE — Plan of Care (Signed)
0700: Pt resting in bed at beginning of shift, pt alert and oriented x4, pt on room air, pt independent ambulatory, pt possible D/C today per MD holding off on D/C per infectious diease

## 2022-11-14 NOTE — Progress Notes (Addendum)
Pathology finalized from core needle biopsy: -  Consistent with a B-cell lymphoma with high-grade features  I spoke with Dr. Burr Medico with Oncology who will have their team see the patient.  We spoke with Logan Ward about these findings and answered preliminary questions he had.  With no clinical improvement and negative GS/Culture @ 48h will stop PO antibiotics. I will notify our [facility] ID team of his transfer.   Dr. Starla Link updated as well.    [name], MSN, NP-C [facility] [email or web address] Pager: [phone] Office: [phone] [facility] Main Line: [facility] Welcome

## 2022-11-14 NOTE — Progress Notes (Signed)
PROGRESS NOTE    Logan Ward  XBJ:[id number] DOB: Feb 24, 1981 DOA: 11/10/2022 PCP: Pcp, No   Brief Narrative:  41 y.o. male with medical history of HIV/AIDS and syphilis presented with a rapidly growing mass in the right anterior chest wall.  On presentation, CT scan revealed 8 cm x 5.9 cm x 7.4 cm soft tissue mass with centralized necrosis extending anterior to the thyroid and into the upper chest with surrounding lymphadenopathy.  ID/cardiothoracic surgery/ENT/IR were consulted.  He underwent ultrasound-guided biopsy of the right neck mass by IR on 11/12/2022.  IV antibiotics were subsequently started after the biopsy.  Assessment & Plan:   Soft tissue mass in the right neck/chest -Imaging as above. -ID/cardiothoracic surgery/ENT/IR were consulted.  He underwent ultrasound-guided biopsy of the right neck mass by IR on 11/12/2022.  IV antibiotics were subsequently started after the biopsy.  Final pathology pending -Follow further recommendations from ID.  ENT recommending no ENT intervention needed at this time.  HIV/AIDS -CD4 count on 10/21/2022 was 501 -ID following.  Continue Biktarvy and Bactrim  Hyponatremia -Mild.  Encourage oral intake  History of syphilis  Obesity -Outpatient follow-up  DVT prophylaxis: SCDs Code Status: Full Family Communication: None at bedside Disposition Plan: Status is: Inpatient Remains inpatient appropriate because: Of severity of illness  Consultants: ID/CT surgery/ENT/IR  Procedures: As above  Antimicrobials:  Anti-infectives (From admission, onward)    Start     Dose/Rate Route Frequency Ordered Stop   11/14/22 1015  amoxicillin-clavulanate (AUGMENTIN) 875-125 MG per tablet 1 tablet        1 tablet Oral Every 12 hours 11/14/22 0929     11/14/22 1015  doxycycline (VIBRA-TABS) tablet 100 mg        100 mg Oral Every 12 hours 11/14/22 0929     11/14/22 0000  amoxicillin-clavulanate (AUGMENTIN) 875-125 MG tablet  Status:  Discontinued         1 tablet Oral 2 times daily 11/14/22 0923 11/14/22    11/14/22 0000  doxycycline (VIBRAMYCIN) 100 MG capsule  Status:  Discontinued        100 mg Oral 2 times daily 11/14/22 0923 11/14/22    11/13/22 0200  vancomycin (VANCOREADY) IVPB 1500 mg/300 mL  Status:  Discontinued        1,500 mg 150 mL/hr over 120 Minutes Intravenous Every 12 hours 11/12/22 1245 11/14/22 0929   11/12/22 1400  vancomycin (VANCOREADY) IVPB 2000 mg/400 mL        2,000 mg 200 mL/hr over 120 Minutes Intravenous  Once 11/12/22 1245 11/12/22 1738   11/12/22 1330  Ampicillin-Sulbactam (UNASYN) 3 g in sodium chloride 0.9 % 100 mL IVPB  Status:  Discontinued        3 g 200 mL/hr over 30 Minutes Intravenous Every 6 hours 11/12/22 1238 11/14/22 0929   11/10/22 1900  bictegravir-emtricitabine-tenofovir AF (BIKTARVY) 50-200-25 MG per tablet 1 tablet        1 tablet Oral Daily 11/10/22 1744     11/10/22 1745  sulfamethoxazole-trimethoprim (BACTRIM DS) 800-160 MG per tablet 1 tablet        1 tablet Oral Daily 11/10/22 1744          Subjective: Patient seen and examined at bedside.  No worsening shortness of breath, vomiting, fever reported.  Complains of constipation.  Complains of intermittent right chest wall and neck pain. Objective: Vitals:   11/13/22 0733 11/13/22 1544 11/13/22 1959 11/14/22 0348  BP: 119/80 120/67 120/77 119/70  Pulse: 80 93 74  73  Resp: '18 18  16  '$ Temp: 98.2 F (36.8 C) 97.9 F (36.6 C) 97.6 F (36.4 C) 97.9 F (36.6 C)  TempSrc: Oral Oral    SpO2: 98% 97% 98% 98%  Weight:      Height:        Intake/Output Summary (Last 24 hours) at 11/14/2022 1023 Last data filed at 11/14/2022 0043 Gross per 24 hour  Intake 441.68 ml  Output --  Net 441.68 ml    Filed Weights   11/11/22 0730 11/11/22 1755  Weight: 123.4 kg 127.7 kg    Examination:  General exam: No acute distress currently.  On room air.   ENT: Right neck/chest wall mass noted. Respiratory system: Decreased breath sounds  at bases bilaterally with scattered crackles  cardiovascular system: Rate mostly controlled; S1 and S2 are heard gastrointestinal system: Abdomen is obese, distended slightly; soft and nontender.  Bowel sounds heard normally  extremities: No clubbing or edema  Data Reviewed: I have personally reviewed following labs and imaging studies  CBC: Recent Labs  Lab 11/10/22 1353  WBC 4.8  NEUTROABS 1.7  HGB 13.4  HCT 42.2  MCV 86.1  PLT 300    Basic Metabolic Panel: Recent Labs  Lab 11/10/22 1353 11/13/22 0628 11/14/22 0817  NA 138 132* 134*  K 4.4 4.2 4.3  CL 103 99 103  CO2 22 23 20*  GLUCOSE 95 99 97  BUN '8 6 7  '$ CREATININE 1.11 1.06 1.05  CALCIUM 9.1 8.8* 8.9    GFR: Estimated Creatinine Clearance: 132.3 mL/min (by C-G formula based on SCr of 1.05 mg/dL). Liver Function Tests: Recent Labs  Lab 11/10/22 1353  AST 31  ALT 14  ALKPHOS 54  BILITOT 1.2  PROT 7.3  ALBUMIN 3.9    No results for input(s): "LIPASE", "AMYLASE" in the last 168 hours. No results for input(s): "AMMONIA" in the last 168 hours. Coagulation Profile: No results for input(s): "INR", "PROTIME" in the last 168 hours. Cardiac Enzymes: No results for input(s): "CKTOTAL", "CKMB", "CKMBINDEX", "TROPONINI" in the last 168 hours. BNP (last 3 results) No results for input(s): "PROBNP" in the last 8760 hours. HbA1C: No results for input(s): "HGBA1C" in the last 72 hours. CBG: No results for input(s): "GLUCAP" in the last 168 hours. Lipid Profile: No results for input(s): "CHOL", "HDL", "LDLCALC", "TRIG", "CHOLHDL", "LDLDIRECT" in the last 72 hours. Thyroid Function Tests: No results for input(s): "TSH", "T4TOTAL", "FREET4", "T3FREE", "THYROIDAB" in the last 72 hours. Anemia Panel: No results for input(s): "VITAMINB12", "FOLATE", "FERRITIN", "TIBC", "IRON", "RETICCTPCT" in the last 72 hours. Sepsis Labs: No results for input(s): "PROCALCITON", "LATICACIDVEN" in the last 168 hours.  Recent Results  (from the past 240 hour(s))  Resp Panel by RT-PCR (Flu A&B, Covid) Anterior Nasal Swab     Status: None   Collection Time: 11/10/22  2:03 PM   Specimen: Anterior Nasal Swab  Result Value Ref Range Status   SARS Coronavirus 2 by RT PCR NEGATIVE NEGATIVE Final    Comment: (NOTE) SARS-CoV-2 target nucleic acids are NOT DETECTED.  The SARS-CoV-2 RNA is generally detectable in upper respiratory specimens during the acute phase of infection. The lowest concentration of SARS-CoV-2 viral copies this assay can detect is 138 copies/mL. A negative result does not preclude SARS-Cov-2 infection and should not be used as the sole basis for treatment or other patient management decisions. A negative result may occur with  improper specimen collection/handling, submission of specimen other than nasopharyngeal swab, presence of viral  mutation(s) within the areas targeted by this assay, and inadequate number of viral copies(<138 copies/mL). A negative result must be combined with clinical observations, patient history, and epidemiological information. The expected result is Negative.  Fact Sheet for Patients:  EntrepreneurPulse.com.au  Fact Sheet for Healthcare Providers:  IncredibleEmployment.be  This test is no t yet approved or cleared by the [place] FDA and  has been authorized for detection and/or diagnosis of SARS-CoV-2 by FDA under an Emergency Use Authorization (EUA). This EUA will remain  in effect (meaning this test can be used) for the duration of the COVID-19 declaration under Section 564(b)(1) of the Act, 21 U.S.C.section 360bbb-3(b)(1), unless the authorization is terminated  or revoked sooner.       Influenza A by PCR NEGATIVE NEGATIVE Final   Influenza B by PCR NEGATIVE NEGATIVE Final    Comment: (NOTE) The Xpert Xpress SARS-CoV-2/FLU/RSV plus assay is intended as an aid in the diagnosis of influenza from Nasopharyngeal swab specimens  and should not be used as a sole basis for treatment. Nasal washings and aspirates are unacceptable for Xpert Xpress SARS-CoV-2/FLU/RSV testing.  Fact Sheet for Patients: EntrepreneurPulse.com.au  Fact Sheet for Healthcare Providers: IncredibleEmployment.be  This test is not yet approved or cleared by the [place] FDA and has been authorized for detection and/or diagnosis of SARS-CoV-2 by FDA under an Emergency Use Authorization (EUA). This EUA will remain in effect (meaning this test can be used) for the duration of the COVID-19 declaration under Section 564(b)(1) of the Act, 21 U.S.C. section 360bbb-3(b)(1), unless the authorization is terminated or revoked.  Performed at [facility], [place] [address] [place], [place] [address]   Aerobic/Anaerobic Culture w Gram Stain (surgical/deep wound)     Status: None (Preliminary result)   Collection Time: 11/12/22 12:16 PM   Specimen: Tissue  Result Value Ref Range Status   Specimen Description TISSUE  Final   Special Requests NONE  Final   Gram Stain NO WBC SEEN NO ORGANISMS SEEN   Final   Culture   Final    NO GROWTH < 24 HOURS Performed at [facility], [place] [address] [place], [place] [address]    Report Status PENDING  Incomplete         Radiology Studies: [place] CORE BIOPSY (SOFT TISSUE)  Result Date: 11/12/2022 INDICATION: 41 year old gentleman with history of lymphoma presents to IR for biopsy of right neck mass EXAM: Ultrasound-guided biopsy of right neck mass MEDICATIONS: None. ANESTHESIA/SEDATION: Moderate (conscious) sedation was employed during this procedure. A total of Versed 2.5 mg and Fentanyl 75 mcg was administered intravenously. Moderate Sedation Time: 10 minutes. The patient's level of consciousness and vital signs were monitored continuously by radiology nursing throughout the procedure under my direct supervision. COMPLICATIONS: None immediate. PROCEDURE:  Informed written consent was obtained from the patient after a thorough discussion of the procedural risks, benefits and alternatives. All questions were addressed. Maximal Sterile Barrier Technique was utilized including caps, mask, sterile gowns, sterile gloves, sterile drape, hand hygiene and skin antiseptic. A timeout was performed prior to the initiation of the procedure. Patient position supine on the ultrasound table. Right neck skin prepped and draped in usual sterile fashion. Following local lidocaine administration, 5- 18 gauge cores were obtained from the right neck mass utilizing continuous ultrasound guidance. Two cores were sent for Gram stain and culture in sterile saline. Three cores were sent for histologic analysis in formalin. Needle removed and hemostasis achieved with 5 minutes of manual  compression. Post procedure ultrasound images showed no evidence of significant hemorrhage. IMPRESSION: Ultrasound-guided biopsy of right neck mass. Electronically Signed   By: Miachel Roux M.D.   On: 11/12/2022 17:17        Scheduled Meds:  amoxicillin-clavulanate  1 tablet Oral Q12H   bictegravir-emtricitabine-tenofovir AF  1 tablet Oral Daily   doxycycline  100 mg Oral Q12H   lidocaine (PF)  5 mL Intradermal Once   polyethylene glycol  17 g Oral Daily   senna-docusate  1 tablet Oral BID   sulfamethoxazole-trimethoprim  1 tablet Oral Daily   traMADol  100 mg Oral Q6H   Continuous Infusions:          Aline August, MD [facility] 11/14/2022, 10:23 AM

## 2022-11-14 NOTE — Consult Note (Signed)
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 male who has been referred to [place] by Dr Starla Link MD for evaluation and management of newly diagnosed Burkitt's lymphoma in the setting of known history of HIV/AIDS.  Patient has a history of HIV/AIDS [last CD4 count on 10/21/2022 of 501 and viral load of 324 copies per mL, acquired through by sexual contact, diagnosed 3 to 4 months ago, follows with Marya Amsler Calone], syphilis, obesity presented to the hospital on 11/10/2022 with rapidly growing right anterior chest wall mass.  CT of the neck on 11/10/2022 showed large infiltrative centrally necrotic soft tissue mass throughout the right neck and extending to the upper chest anterior to the clavicle measuring 8.1 x 5.9 x 7.4 cm in size.  There are numerous additional prominent right sided cervical chain lymph nodes measuring 1 cm at level 2A and 1.1 cm in the posterior triangle. CT chest abdomen pelvis done on 11/10/2022 showed enlarged right lower cervical, supraclavicular and bilaterally axillary lymph nodes.  Largest right axillary lymph node measuring 2.2 x 1.7 cm.  Diffuse fat stranding throughout the superior mediastinum.  Prominent subcentimeter retroperitoneal and iliac lymph nodes.  Diffuse retroperitoneal fat stranding of uncertain significance.  No evidence of mass or evidence of organ metastatic disease in the abdomen or pelvis.  Patient had an ultrasound-guided core needle biopsy of the right neck mass on 11/12/2022 which shows B-cell non-Hodgkin's lymphoma with high-grade features and findings consistent with Burkitt's lymphoma.  FISH testing for confirmatory purposes are currently pending.  Patient was treated with IV vancomycin and Unasyn empirically  by infectious disease for concerns of possible infection.  He is on Biktarvy for his HIV treatment and continues to be on Bactrim prophylaxis for PCP.  Hematology/oncology was consulted for further evaluation and management of his newly diagnosed Burkitt's lymphoma.  Patient notes that he first presented with right neck swelling in August and presented to [facility] and was empirically treated with antibiotics and steroids with improvement in his right neck swelling. About 2 weeks ago he started noticing right neck swelling again and this rapidly enlarged in size and started causing some effect on his breathing which is why it came to the emergency room.  He notes no weight loss.  No fevers no chills no night sweats.  No other acute new focal symptoms other than the right neck pain and swelling. No swallowing problems or acute shortness of breath at this time.  No new headaches or focal neurological deficits.  MEDICAL HISTORY:  Past Medical History:  Diagnosis Date   Anal fissure    HIV infection ([place])    Internal hemorrhoids    Obesity    Rectal ulcer     SURGICAL HISTORY: Rt cervical LN biopsy  SOCIAL HISTORY: Social History   Socioeconomic History   Marital status: Single    Spouse name: Not on file   Number of children: Not on file   Years of education: Not on file   Highest education level: Not on file  Occupational History   Not on file  Tobacco Use   Smoking status: Never   Smokeless tobacco: Never  Vaping Use   Vaping Use: Never used  Substance and Sexual Activity   Alcohol use: No  Drug use: No   Sexual activity: Yes    Comment: given condoms  Other Topics Concern   Not on file  Social History Narrative   Not on file   Social Determinants of Health   Financial Resource Strain: Low Risk  (10/24/2022)   Overall Financial Resource Strain (CARDIA)    Difficulty of Paying Living Expenses: Not hard at all  Food Insecurity: No Food  Insecurity (11/11/2022)   Hunger Vital Sign    Worried About Running Out of Food in the Last Year: Never true    Ran Out of Food in the Last Year: Never true  Transportation Needs: No Transportation Needs (11/11/2022)   PRAPARE - Hydrologist (Medical): No    Lack of Transportation (Non-Medical): No  Physical Activity: Sufficiently Active (10/24/2022)   Exercise Vital Sign    Days of Exercise per Week: 3 days    Minutes of Exercise per Session: 60 min  Recent Concern: Physical Activity - Insufficiently Active (10/24/2022)   Exercise Vital Sign    Days of Exercise per Week: 3 days    Minutes of Exercise per Session: 20 min  Stress: No Stress Concern Present (10/24/2022)   [place]    Feeling of Stress : Not at all  Social Connections: Unknown (10/24/2022)   Social Connection and Isolation Panel [NHANES]    Frequency of Communication with Friends and Family: Twice a week    Frequency of Social Gatherings with Friends and Family: Twice a week    Attends Religious Services: 1 to 4 times per year    Active Member of [employer] or Organizations: No    Attends Archivist Meetings: 1 to 4 times per year    Marital Status: Patient refused  Intimate Partner Violence: Not At Risk (11/11/2022)   Humiliation, Afraid, Rape, and Kick questionnaire    Fear of Current or Ex-Partner: No    Emotionally Abused: No    Physically Abused: No    Sexually Abused: No  Patient notes that he is married but separated.  No children. He is bisexual Works as a Clinical research associate.  FAMILY HISTORY: Family History  Problem Relation Age of Onset   Hypertension Father    Throat cancer Father    Heart attack Brother    Colon cancer Neg Hx    Esophageal cancer Neg Hx    Stomach cancer Neg Hx     ALLERGIES:  has No Known Allergies.  MEDICATIONS:  Current Facility-Administered Medications  Medication Dose  Route Frequency Provider Last Rate Last Admin   acetaminophen (TYLENOL) tablet 650 mg  650 mg Oral Q6H PRN [name], MD   650 mg at 11/13/22 1839   Or   acetaminophen (TYLENOL) suppository 650 mg  650 mg Rectal Q6H PRN [name], MD       amoxicillin-clavulanate (AUGMENTIN) 875-125 MG per tablet 1 tablet  1 tablet Oral Q12H Carlyle Basques, MD   1 tablet at 11/14/22 1027   bictegravir-emtricitabine-tenofovir AF (BIKTARVY) 50-200-25 MG per tablet 1 tablet  1 tablet Oral Daily [name], MD   1 tablet at 11/14/22 0918   bisacodyl (DULCOLAX) EC tablet 5 mg  5 mg Oral Daily PRN Mansy, Jan A, MD       doxycycline (VIBRA-TABS) tablet 100 mg  100 mg Oral Q12H Carlyle Basques, MD   100 mg at 11/14/22 1027   iohexol (OMNIPAQUE) 350 MG/ML injection 100 mL  100 mL Intravenous Once PRN Theodis Blaze E, PA-C       lidocaine (PF) (XYLOCAINE) 1 % injection 5 mL  5 mL Intradermal Once Mir, Paula Libra, MD       magnesium hydroxide (MILK OF MAGNESIA) suspension 30 mL  30 mL Oral Daily PRN Mansy, Jan A, MD       ondansetron (ZOFRAN) tablet 4 mg  4 mg Oral Q6H PRN [name], MD   4 mg at 11/13/22 1430   Or   ondansetron (ZOFRAN) injection 4 mg  4 mg Intravenous Q6H PRN [name], [name], MD       polyethylene glycol (MIRALAX / GLYCOLAX) packet 17 g  17 g Oral Daily Mansy, Jan A, MD       senna-docusate (Senokot-S) tablet 1 tablet  1 tablet Oral BID Aline August, MD   1 tablet at 11/14/22 1027   sodium phosphate (FLEET) 7-19 GM/118ML enema 1 enema  1 enema Rectal Daily PRN Mansy, Jan A, MD       sulfamethoxazole-trimethoprim (BACTRIM DS) 800-160 MG per tablet 1 tablet  1 tablet Oral Daily [name], MD   1 tablet at 11/14/22 0918   traMADol (ULTRAM) tablet 100 mg  100 mg Oral Q6H [name], MD   100 mg at 11/14/22 0636    REVIEW OF SYSTEMS:    10 Point review of Systems was done is negative except as noted above.  PHYSICAL EXAMINATION: ECOG PERFORMANCE STATUS: 1 - Symptomatic but  completely ambulatory  . Vitals:   11/14/22 0348 11/14/22 1128  BP: 119/70 115/83  Pulse: 73 93  Resp: 16 15  Temp: 97.9 F (36.6 C) 98.4 F (36.9 C)  SpO2: 98% 98%   Filed Weights   11/11/22 0730 11/11/22 1755  Weight: 272 lb 0.8 oz (123.4 kg) 281 lb 8.4 oz (127.7 kg)   .Body mass index is 37.14 kg/m.  GENERAL:alert, in no acute distress and comfortable SKIN: no acute rashes, no significant lesions EYES: conjunctiva are pink and non-injected, sclera anicteric OROPHARYNX: MMM, no exudates, no oropharyngeal erythema or ulceration NECK: supple, no JVD LYMPH: Palpable enlarged right lower cervical and right posterior cervical lymphadenopathy as well as bilateral axillary lymphadenopathy. LUNGS: clear to auscultation b/l with normal respiratory effort HEART: regular rate & rhythm ABDOMEN:  normoactive bowel sounds , non tender, not distended. Extremity: no pedal edema PSYCH: alert & oriented x 3 with fluent speech NEURO: no focal motor/sensory deficits  LABORATORY DATA:  I have reviewed the data as listed  .    Latest Ref Rng & Units 11/10/2022    1:53 PM 10/21/2022    3:35 PM 07/23/2022    9:18 AM  CBC  WBC 4.0 - 10.5 K/uL 4.8  4.0  2.1   Hemoglobin 13.0 - 17.0 g/dL 13.4  13.0  13.6   Hematocrit 39.0 - 52.0 % 42.2  40.0  42.7   Platelets 150 - 400 K/uL 185  227  160     .    Latest Ref Rng & Units 11/14/2022    8:17 AM 11/13/2022    6:28 AM 11/10/2022    1:53 PM  CMP  Glucose 70 - 99 mg/dL 97  99  95   BUN 6 - 20 mg/dL _0 Creatinine 0.61 - 1.24 mg/dL 1.05  1.06  1.11   Sodium 135 - 145 mmol/L 134  132  138   Potassium 3.5 - 5.1 mmol/L 4.3  4.2  4.4  Chloride 98 - 111 mmol/L 103  99  103   CO2 22 - 32 mmol/L _0 Calcium 8.9 - 10.3 mg/dL 8.9  8.8  9.1   Total Protein 6.5 - 8.1 g/dL   7.3   Total Bilirubin 0.3 - 1.2 mg/dL   1.2   Alkaline Phos 38 - 126 U/L   54   AST 15 - 41 U/L   31   ALT 0 - 44 U/L   14      SURGICAL PATHOLOGY CASE:  [id number] PATIENT: Logan Ward Surgical Pathology Report     Clinical History: right neck mass (cm)     FINAL MICROSCOPIC DIAGNOSIS:  A. LYMPH NODE, RIGHT NECK, NEEDLE CORE BIOPSY: -  Consistent with a B-cell lymphoma with high-grade features, see note  Note: The needle core biopsies consist predominantly of geographic tumoral type necrosis.  Focally there are sheets of moderate-sized CD20/PAX5 positive lymphoid cells that have a high apoptotic/mitotic index with a "starry sky" appearance.  Additionally, while there is limited material the cells of interest are positive for CD10 and BCL6 and negative for both Bcl-2 and MUM1.  CD3/CD5 highlights background T cells CD138, CD30, cyclin D1 and EBV ISH are negative.  In the areas of viability the proliferation rate is greater than 90%.  While the evaluation is limited due to the extensive necrosis the overall findings are consistent with a B-cell lymphoma and most consistent with a Burkitt lymphoma.  FISH testing for confirmatory purposes will be attempted; however, given the extensive necrosis there may be insufficient material.  GROSS DESCRIPTION:  The specimen is received in formalin and consists of 3 cores of tan-red soft tissue, ranging from 1.4 to 1.6 cm in length by 0.1 cm in diameter. The specimen is entirely submitted in 1 cassette.  Craig Staggers 11/12/2022)    Final Diagnosis performed by Tilford Pillar DO.   Electronically signed 11/14/2022      RADIOGRAPHIC STUDIES: I have personally reviewed the radiological images as listed and agreed with the findings in the report. [place] CORE BIOPSY (SOFT TISSUE)  Result Date: 11/12/2022 INDICATION: 41 year old gentleman with history of lymphoma presents to IR for biopsy of right neck mass EXAM: Ultrasound-guided biopsy of right neck mass MEDICATIONS: None. ANESTHESIA/SEDATION: Moderate (conscious) sedation was employed during this procedure. A total of Versed 2.5 mg and Fentanyl  75 mcg was administered intravenously. Moderate Sedation Time: 10 minutes. The patient's level of consciousness and vital signs were monitored continuously by radiology nursing throughout the procedure under my direct supervision. COMPLICATIONS: None immediate. PROCEDURE: Informed written consent was obtained from the patient after a thorough discussion of the procedural risks, benefits and alternatives. All questions were addressed. Maximal Sterile Barrier Technique was utilized including caps, mask, sterile gowns, sterile gloves, sterile drape, hand hygiene and skin antiseptic. A timeout was performed prior to the initiation of the procedure. Patient position supine on the ultrasound table. Right neck skin prepped and draped in usual sterile fashion. Following local lidocaine administration, 5- 18 gauge cores were obtained from the right neck mass utilizing continuous ultrasound guidance. Two cores were sent for Gram stain and culture in sterile saline. Three cores were sent for histologic analysis in formalin. Needle removed and hemostasis achieved with 5 minutes of manual compression. Post procedure ultrasound images showed no evidence of significant hemorrhage. IMPRESSION: Ultrasound-guided biopsy of right neck mass. Electronically Signed   By: Miachel Roux M.D.   On: 11/12/2022 17:17   CT Soft  Tissue Neck W Contrast  Result Date: 11/10/2022 CLINICAL DATA:  Neck mass with history of HIV. EXAM: CT NECK WITH CONTRAST TECHNIQUE: Multidetector CT imaging of the neck was performed using the standard protocol following the bolus administration of intravenous contrast. RADIATION DOSE REDUCTION: This exam was performed according to the departmental dose-optimization program which includes automated exposure control, adjustment of the mA and/or kV according to patient size and/or use of iterative reconstruction technique. CONTRAST:  196m OMNIPAQUE IOHEXOL 350 MG/ML SOLN COMPARISON:  CT neck 6 days ago. FINDINGS:  Pharynx and larynx: The nasal cavity and nasopharynx are unremarkable. The oral cavity and oropharynx are unremarkable. The parapharyngeal spaces are clear. The hypopharynx and larynx are unremarkable. The vocal folds are normal in appearance. There is no retropharyngeal fluid collection.  The airway is patent. Salivary glands: The parotid and submandibular glands are unremarkable. Thyroid: Unremarkable. Lymph nodes: There is enlarged centrally necrotic right level I lymph node measuring up to 2.1 cm in short axis, similar to the CT from 6 days ago. The large infiltrative centrally necrotic soft tissue mass throughout the right neck measuring up to approximately 8.1 cm TV x 5.9 cm AP by 7.4 cm cc is also not significantly changed in size. Inferior extent into the upper chest anterior to the thyroid with centrally necrotic appearing tissue measuring 4.9 cm x 5.4 cm is unchanged when measured again using similar technique. Overlying skin thickening is similar. The mass is inseparable from the sternocleidomastoid muscle. There are numerous additional prominent right-sided cervical chain lymph nodes measuring up to 1.0 cm at level IIA (3-38), and 1.1 cm in short axis in the posterior triangle (3-43). Vascular: The major vasculature of the neck is unremarkable. Limited intracranial: The imaged portions of the posterior fossa are unremarkable. Visualized orbits: Not included within the field of view. Mastoids and visualized paranasal sinuses: Clear. Skeleton: There is no acute osseous abnormality or suspicious osseous lesion. Upper chest: Assessed on the separately dictated CT chest. Other: None. IMPRESSION: No significant interval change since the CT neck from 6 days prior. Unchanged infiltrative soft tissue mass in the right neck extending to the upper chest anterior to the clavicle with central necrosis and surrounding lymphadenopathy. Differential again includes malignancy (sarcoma, lymphoma), or infection (including  TB. Sampling is recommended. Electronically Signed   By: PValetta MoleM.D.   On: 11/10/2022 16:27   CT CHEST ABDOMEN PELVIS W CONTRAST  Result Date: 11/10/2022 CLINICAL DATA:  Lymphoma, neck mass, metastatic disease evaluation * Tracking Code: BO * EXAM: CT CHEST, ABDOMEN, AND PELVIS WITH CONTRAST TECHNIQUE: Multidetector CT imaging of the chest, abdomen and pelvis was performed following the standard protocol during bolus administration of intravenous contrast. RADIATION DOSE REDUCTION: This exam was performed according to the departmental dose-optimization program which includes automated exposure control, adjustment of the mA and/or kV according to patient size and/or use of iterative reconstruction technique. CONTRAST:  1050mOMNIPAQUE IOHEXOL 350 MG/ML SOLN COMPARISON:  CT neck, 11/04/2022, CT pelvis, 02/09/2017 FINDINGS: CT CHEST FINDINGS Cardiovascular: No significant vascular findings. Normal heart size. No pericardial effusion. Mediastinum/Nodes: Enlarged right lower cervical, supraclavicular, and bilateral axillary lymph nodes, largest right axillary nodes measuring up to 2.2 x 1.7 cm (series 3, image 18). Diffuse fat stranding throughout the superior mediastinum (series 3, image 19), thyroid gland, trachea, and esophagus demonstrate no significant findings. Lungs/Pleura: Lungs are clear. No pleural effusion or pneumothorax. Musculoskeletal: Partially imaged, heterogeneous mass centered in the anterior right neck, incompletely imaged and better assessed by simultaneous  dedicated examination of the neck (series 3, image 1). No acute osseous findings. CT ABDOMEN PELVIS FINDINGS Hepatobiliary: No solid liver abnormality is seen. No gallstones, gallbladder wall thickening, or biliary dilatation. Pancreas: Unremarkable. No pancreatic ductal dilatation or surrounding inflammatory changes. Spleen: Normal in size without significant abnormality. Adrenals/Urinary Tract: Adrenal glands are unremarkable. Kidneys  are normal, without renal calculi, solid lesion, or hydronephrosis. Bladder is unremarkable. Stomach/Bowel: Stomach is within normal limits. Appendix appears normal. No evidence of bowel wall thickening, distention, or inflammatory changes. Vascular/Lymphatic: No significant vascular findings are present. Prominent subcentimeter retroperitoneal and iliac lymph nodes, for example a right external iliac node measuring 1.1 x 0.8 cm (series 3, image 102). Reproductive: No mass or other abnormality. Other: No abdominal wall hernia or abnormality. No ascites. Diffuse retroperitoneal fat stranding (series 3, image 80). Musculoskeletal: No acute osseous findings. IMPRESSION: 1. Partially imaged, heterogeneous mass centered in the anterior right neck, incompletely imaged and better assessed by simultaneous dedicated examination of the neck. As previously reported, general differential considerations include both malignancy and infectious process such as scrofula. 2. Enlarged right lower cervical, supraclavicular, and bilateral axillary lymph nodes, which may be reactive although are generally concerning for metastatic disease. 3. Prominent subcentimeter retroperitoneal and iliac lymph nodes, nonspecific although as above concerning for additional nodal metastatic disease. 4. Diffuse fat stranding throughout the superior mediastinum, of uncertain nature, possibly reflecting mediastinal infectious involvement or alternately vascular/lymphatic congestion. Prior thoracic radiotherapy could likewise have this appearance. 5. Diffuse retroperitoneal fat stranding, as above of uncertain significance, possibly infectious or inflammatory, for example incidental and related to pancreatitis, or as above perhaps related to prior abdominal radiotherapy. 6. No mass or evidence of organ metastatic disease in the abdomen or pelvis. Electronically Signed   By: Delanna Ahmadi M.D.   On: 11/10/2022 16:26   DG Chest 2 View  Result Date:  11/10/2022 CLINICAL DATA:  Dyspnea EXAM: CHEST - 2 VIEW COMPARISON:  06/27/2016 FINDINGS: Small areas of consolidation or volume loss identified right middle lobe and lingula. No pneumothorax or pleural effusion. Normal pulmonary vasculature. Unremarkable cardiac silhouette. Unremarkable osseous structures. IMPRESSION: Medial bibasilar subsegmental atelectasis or consolidation. Electronically Signed   By: Sammie Bench M.D.   On: 11/10/2022 14:53   CT SOFT TISSUE NECK W CONTRAST  Result Date: 11/05/2022 CLINICAL DATA:  Neck mass. EXAM: CT NECK WITH CONTRAST TECHNIQUE: Multidetector CT imaging of the neck was performed using the standard protocol following the bolus administration of intravenous contrast. RADIATION DOSE REDUCTION: This exam was performed according to the departmental dose-optimization program which includes automated exposure control, adjustment of the mA and/or kV according to patient size and/or use of iterative reconstruction technique. CONTRAST:  87m OMNIPAQUE IOHEXOL 350 MG/ML SOLN COMPARISON:  CT Neck 07/23/22 FINDINGS: Pharynx and larynx: Normal. No mass or swelling. Salivary glands: No inflammation, mass, or stone. Thyroid: Normal. Lymph nodes: There are prominent lymph nodes, for example a 1.2 cm level 1 a lymph node, unchanged from prior exam. There is also a prominent 8 mm right level 3 lymph, slightly increased in size from prior exam. Along the inferior aspect of the right submandibular gland there is a 2.8 x 1.8 cm lesion with central hypodensity (series 3, image 56) which could represent a centrally necrotic lymph node. Vascular: There is mass effect on the right internal jugular vein (series 3, image 68). Limited intracranial: Negative. Visualized orbits: Negative. Mastoids and visualized paranasal sinuses: Clear. Skeleton: No acute or aggressive process. Upper chest: See below.  No  focal pulmonary nodule is visualized. Other: There is marked interval worsening in the degree  soft tissue abnormality seen in the right neck compared to prior exam dated 07/23/2022. There is now a large soft tissue mass along the anterior chest wall, adjacent to the sternoclavicular joint on the right measuring 4.2 x 3 4 x 5.6 cm (series 3, image 5). There is infiltrative soft tissue surrounding this mass that extends superiorly along the supraclavicular region to the level of the hyoid bone and submandibular region (series 3, image 64). Inferiorly this mass likely now also extends into the mediastinum where there is soft tissue stranding (series 3, image 115). Infiltrative soft tissues also seen along the left supraclavicular region (series 3, image 77), but to a lesser degree than the right. Portions of this mass appear to have a cutaneous component, for example along the right aspect of the chin (series 3, image 59), as well as the portion adjacent to the right sternoclavicular joint (series 3, image 79). IMPRESSION: 1. Marked interval worsening in the degree of soft tissue abnormality seen in the right neck compared to prior exam dated 07/23/2022. There is now a multi spatial infiltrative soft tissue mass involving a large portion of the right neck. Inferiorly this mass extends into the mediastinum and superiorly this mass extends to the submandibular region. Infiltrative soft tissue is also seen along the left supraclavicular region. Portions of this mass appear to have a cutaneous component, for example along the right aspect of the chin, as well as the right sternoclavicular joint. Findings are concerning for malignancy, with differential considerations including lymphoma or sarcoma. Tissue sampling is recommended. Additionally CT of the chest is also recommended to fully characterize the mediastinal extent of this mass. 2. 2.8 x 1.8 cm lesion with central hypodensity along the inferior aspect of the right submandibular gland could represent a centrally necrotic lymph node. Electronically Signed   By:  Marin Roberts M.D.   On: 11/05/2022 10:29    ASSESSMENT & PLAN:   41 year old male with HIV/AIDS with newly diagnosed Burkitt's lymphoma  #1 Newly diagnosed Burkitt's lymphoma Presenting with rapidly growing right neck mass extending into the chest, bilateral x-ray lymphadenopathy and also concern for possible involvement of retroperitoneal lymph nodes. Concerning for at least stage II possibly stage III disease. Some borderline lymphadenopathy in the abdomen could also be from his recently diagnosed HIV/AIDS. No clinical symptoms suggestive of CNS involvement at this time. No constitutional symptoms. No significant cytopenias to suggest bone marrow involvement.  First presented in August at [facility] and was treated empirically with steroids and antibiotics with improvement in swelling prior to additional progression.  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  Patient reports this was completely treated.  Plan -I had a detailed discussion with the patient regarding his new diagnosis of Burkitt's lymphoma. We discussed his diagnosis, natural history of the disease, causative etiologies, prognosis and treatment considerations in detail. -Discussed all available lab results and imaging studies as well as pathology results in detail with the patient. -Patient would likely need an inpatient PET CT scan if this is possible but if not possible would not delay treatment and would get this done as outpatient. -Unilateral bone marrow aspiration and biopsy for accurate staging as soon as possible-discussed with IR. -Echocardiogram -PICC line placement.  Discussed with IR they are unable to place the port in a relatively short time.[name]  Will eventually need a Port-A-Cath placement. -Will add labs including LDH and uric acid. -Will start patient on allopurinol 100 mg p.o. twice daily for tumor lysis syndrome prophylaxis -Will start  the patient on dexamethasone 6 mg twice daily to start shrinking the tumor pending initiation of systemic therapies. -Orders placed for bone marrow biopsy and Port-A-Cath, PET scan and echocardiogram. -Patient will need to be transferred to [facility] 6 E. for initiation of dose adjusted EPOCH-R chemotherapy hopefully from Monday 11/18/2022. -Will plan to do staging lumbar puncture and first dose of methotrexate for CNS prophylaxis while at [facility]. -Patient notes that he would not want his HIV/AIDS diagnosis to be shared with any family members and at this time notes that if he loses decisional capacity he would not want his wife to make decisions on his behalf but prefers for his mother to make those decisions. -He prefers to be the one to share information with his other family members. He will continue to need close follow-up with ID for continued management and optimization of his HIV treatment since this will have a bearing on his lymphoma treatment success rates. -On Bactrim for PJP prophylaxis. -Orders for dose adjusted EPOCH-R were placed.  -Oncology will continue to follow -Hospitalist to help transfer patient to [facility] under hospitalist service to 6 E..  The total time spent in the appointment was 110 minutes*.  All of the patient's questions were answered with apparent satisfaction. The patient knows to call the clinic with any problems, questions or concerns.   Sullivan Lone MD MS AAHIVMS [facility] Hematology/Oncology Physician [facility]  .*Total Encounter Time as defined by the Centers for Medicare and Medicaid Services includes, in addition to the face-to-face time of a patient visit (documented in the note above) non-face-to-face time: obtaining and reviewing outside history, ordering and reviewing medications, tests or procedures, care coordination (communications with other health care professionals or caregivers) and  documentation in the medical record.  11/14/2022 12:32 PM

## 2022-11-14 NOTE — Progress Notes (Signed)
[facility] for Infectious Disease  Date of Admission:  11/10/2022     Total days of antibiotics 3        ASSESSMENT: Logan Ward is a 41 y.o. male here for evaluation and treatment for rapidly growing mass to anterior right chest. CT imaging with submental soft tissue mass in the right neck extending to the upper chest anterior to the clavicle with central necrosis and surrounding lymphadenopathy described to be possibly infectious/inflammatory or malignant. Imaging reviewed - no contact with underlying bone structures, fortunately. He is now status post core biopsy of the mass with initiation of broad-spectrum antibiotics (Vanco and Unasyn). 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 Will consult heme-onc for further recommendations and management of recently diagnosed malignancy. Tissue culture NGTD.  Continue biktarvy / bactrim prophy - last VL 3 weeks ago 324 after short med lapse with insurance. CD4 up to 500 with a notable rapid reconstitution from a few months back.  PLAN: Hematology/oncology consulted, appreciate assistance Discontinue Vanc and Unasyn Start Doxy and Augmentin, can likely discontinue these if cultures remain negative. Follow-up AFB smear/culture, fungal culture Continue bactrim prophylaxis Continue Biktarvy daily for HIV treatment    Principal Problem:   Chest mass Active Problems:   AIDS (acquired immune deficiency syndrome) (HCC)   History of syphilis   bictegravir-emtricitabine-tenofovir AF  1 tablet Oral Daily   lidocaine (PF)  5 mL Intradermal Once   polyethylene glycol  17 g Oral Daily   sulfamethoxazole-trimethoprim  1 tablet Oral Daily   traMADol  100 mg Oral Q6H    SUBJECTIVE: No acute events overnight Chest wall mass slightly more painful today compared to yesterday Denies any fevers, trouble swallowing or shortness of breath Endorses some  constipation, last BM yesterday  No Known Allergies  OBJECTIVE: Vitals:   11/13/22 0733 11/13/22 1544 11/13/22 1959 11/14/22 0348  BP: 119/80 120/67 120/77 119/70  Pulse: 80 93 74 73  Resp: '18 18  16  '$ Temp: 98.2 F (36.8 C) 97.9 F (36.6 C) 97.6 F (36.4 C) 97.9 F (36.6 C)  TempSrc: Oral Oral    SpO2: 98% 97% 98% 98%  Weight:      Height:       Body mass index is 37.14 kg/m.  Physician Exam General: Pleasant, well-appearing man laying in bed. No acute distress. HEENT: Moist mucous membrane. No tongue swelling. Chest wall: Large right neck/chest wall mass, warm to touch. CV: RRR. No murmurs, rubs, or gallops. No LE edema Pulmonary: Comfortable on room air. Lungs clear to auscultation. No stridor or wheezing.  Abdominal: Soft, nontender, nondistended. Normal bowel sounds. Extremities: Palpable radial and DP pulses. Normal ROM. Skin: Warm and dry. No obvious rash or lesions. Neuro: A&Ox3. Moves all extremities. Normal sensation. No focal deficit. Psych: Normal mood and affect  Lab Results Lab Results  Component Value Date   WBC 4.8 11/10/2022   HGB 13.4 11/10/2022   HCT 42.2 11/10/2022   MCV 86.1 11/10/2022   PLT 185 11/10/2022    Lab Results  Component Value Date   CREATININE 1.06 11/13/2022   BUN 6 11/13/2022   NA 132 (L) 11/13/2022   K 4.2 11/13/2022   CL 99 11/13/2022   CO2 23 11/13/2022    Lab Results  Component Value Date   ALT 14 11/10/2022   AST 31 11/10/2022   ALKPHOS 54 11/10/2022   BILITOT 1.2 11/10/2022  Microbiology: Recent Results (from the past 240 hour(s))  Resp Panel by RT-PCR (Flu A&B, Covid) Anterior Nasal Swab     Status: None   Collection Time: 11/10/22  2:03 PM   Specimen: Anterior Nasal Swab  Result Value Ref Range Status   SARS Coronavirus 2 by RT PCR NEGATIVE NEGATIVE Final    Comment: (NOTE) SARS-CoV-2 target nucleic acids are NOT DETECTED.  The SARS-CoV-2 RNA is generally detectable in upper respiratory specimens  during the acute phase of infection. The lowest concentration of SARS-CoV-2 viral copies this assay can detect is 138 copies/mL. A negative result does not preclude SARS-Cov-2 infection and should not be used as the sole basis for treatment or other patient management decisions. A negative result may occur with  improper specimen collection/handling, submission of specimen other than nasopharyngeal swab, presence of viral mutation(s) within the areas targeted by this assay, and inadequate number of viral copies(<138 copies/mL). A negative result must be combined with clinical observations, patient history, and epidemiological information. The expected result is Negative.  Fact Sheet for Patients:  EntrepreneurPulse.com.au  Fact Sheet for Healthcare Providers:  IncredibleEmployment.be  This test is no t yet approved or cleared by the [place] FDA and  has been authorized for detection and/or diagnosis of SARS-CoV-2 by FDA under an Emergency Use Authorization (EUA). This EUA will remain  in effect (meaning this test can be used) for the duration of the COVID-19 declaration under Section 564(b)(1) of the Act, 21 U.S.C.section 360bbb-3(b)(1), unless the authorization is terminated  or revoked sooner.       Influenza A by PCR NEGATIVE NEGATIVE Final   Influenza B by PCR NEGATIVE NEGATIVE Final    Comment: (NOTE) The Xpert Xpress SARS-CoV-2/FLU/RSV plus assay is intended as an aid in the diagnosis of influenza from Nasopharyngeal swab specimens and should not be used as a sole basis for treatment. Nasal washings and aspirates are unacceptable for Xpert Xpress SARS-CoV-2/FLU/RSV testing.  Fact Sheet for Patients: EntrepreneurPulse.com.au  Fact Sheet for Healthcare Providers: IncredibleEmployment.be  This test is not yet approved or cleared by the [place] FDA and has been authorized for detection  and/or diagnosis of SARS-CoV-2 by FDA under an Emergency Use Authorization (EUA). This EUA will remain in effect (meaning this test can be used) for the duration of the COVID-19 declaration under Section 564(b)(1) of the Act, 21 U.S.C. section 360bbb-3(b)(1), unless the authorization is terminated or revoked.  Performed at [facility], [place] [address] [place], [place] [address]   Aerobic/Anaerobic Culture w Gram Stain (surgical/deep wound)     Status: None (Preliminary result)   Collection Time: 11/12/22 12:16 PM   Specimen: Tissue  Result Value Ref Range Status   Specimen Description TISSUE  Final   Special Requests NONE  Final   Gram Stain NO WBC SEEN NO ORGANISMS SEEN   Final   Culture   Final    NO GROWTH < 24 HOURS Performed at [facility], [place] [address] [place], [place] [address]    Report Status PENDING  Incomplete    Lacinda Axon, MD 11/14/2022, 2:02 PM IM Resident, PGY-3 Oswaldo Milian 41:10

## 2022-11-14 NOTE — Progress Notes (Signed)
START OFF PATHWAY REGIMEN - Lymphoma and CLL   [id number]:DA-EPOCH-R q21 Days:   A cycle is every 21 days:     Prednisone      Rituximab-xxxx      Etoposide      Doxorubicin      Vincristine      Cyclophosphamide      Filgrastim-xxxx   **Always confirm dose/schedule in your pharmacy ordering system**  Patient Characteristics: High Grade Lymphoma (such as Burkitt), Treating as Specialist or Consulting with Specialist Disease Type: Not Applicable Disease Type: High Grade Lymphoma (such as Burkitt) Disease Type: Not Applicable Please indicate whether you are: A specialist Intent of Therapy: Curative Intent, Discussed with Patient

## 2022-11-15 ENCOUNTER — Inpatient Hospital Stay (HOSPITAL_COMMUNITY): Payer: 59

## 2022-11-15 ENCOUNTER — Other Ambulatory Visit: Payer: Self-pay

## 2022-11-15 ENCOUNTER — Inpatient Hospital Stay: Payer: Self-pay

## 2022-11-15 DIAGNOSIS — B20 Human immunodeficiency virus [HIV] disease: Secondary | ICD-10-CM | POA: Diagnosis not present

## 2022-11-15 DIAGNOSIS — R222 Localized swelling, mass and lump, trunk: Secondary | ICD-10-CM | POA: Diagnosis not present

## 2022-11-15 DIAGNOSIS — Z8619 Personal history of other infectious and parasitic diseases: Secondary | ICD-10-CM | POA: Diagnosis not present

## 2022-11-15 DIAGNOSIS — C8378 Burkitt lymphoma, lymph nodes of multiple sites: Secondary | ICD-10-CM | POA: Diagnosis not present

## 2022-11-15 LAB — BASIC METABOLIC PANEL
Anion gap: 9 (ref 5–15)
BUN: 9 mg/dL (ref 6–20)
CO2: 23 mmol/L (ref 22–32)
Calcium: 9.8 mg/dL (ref 8.9–10.3)
Chloride: 101 mmol/L (ref 98–111)
Creatinine, Ser: 1.06 mg/dL (ref 0.61–1.24)
GFR, Estimated: >60 mL/min (ref >60)
Glucose, Bld: 131 mg/dL — ABNORMAL HIGH (ref 70–99)
Potassium: 4.8 mmol/L (ref 3.5–5.1)
Sodium: 133 mmol/L — ABNORMAL LOW (ref 135–145)

## 2022-11-15 NOTE — Consult Note (Signed)
HPI:  The patient has had a H&P performed within the last 30 days, all history, medications, and exam have been reviewed. The patient denies any interval changes since the H&P.  Previous biopsy results came back with findings of lymphoma.  Plan is to start chemotherapy soon.  IR consulted for bone marrow biopsy and port placement.    Expected to transfer to [facility] today  Medications: Prior to Admission medications   Medication Sig Start Date End Date Taking? Authorizing Provider  sulfamethoxazole-trimethoprim (BACTRIM DS) 800-160 MG tablet Take 1 tablet by mouth daily. 09/05/22  Yes [place] Callas, NP  bictegravir-emtricitabine-tenofovir AF (BIKTARVY) 50-200-25 MG TABS tablet Take 1 tablet by mouth daily. 11/11/22   Golden Circle, FNP     Vital Signs: BP (!) 108/59 (BP Location: Right Arm)   Pulse 68   Temp 97.9 F (36.6 C) (Oral)   Resp 18   Ht _0  (1.854 m)   Wt 281 lb 8.4 oz (127.7 kg)   SpO2 100%   BMI 37.14 kg/m   Physical Exam Constitutional:      General: He is not in acute distress.    Appearance: He is well-developed. He is not ill-appearing.  Eyes:     Extraocular Movements: Extraocular movements intact.  Neck:     Comments: Right neck mass visible Cardiovascular:     Rate and Rhythm: Normal rate and regular rhythm.  Pulmonary:     Effort: Pulmonary effort is normal.     Breath sounds: Normal breath sounds.  Skin:    General: Skin is warm and dry.     Capillary Refill: Capillary refill takes less than 2 seconds.  Neurological:     General: No focal deficit present.     Mental Status: He is alert and oriented to person, place, and time.  Psychiatric:        Mood and Affect: Mood normal.        Behavior: Behavior normal.     Mallampati Score:  MD Evaluation Airway: WNL Heart: WNL Abdomen: WNL Chest/ Lungs: WNL ASA  Classification: 3 Mallampati/Airway Score: Two  Labs:  CBC: Recent Labs    07/19/22 1143 07/23/22 0918 10/21/22 1535  11/10/22 1353  WBC 3.3* 2.1* 4.0 4.8  HGB 13.3 13.6 13.0* 13.4  HCT 40.3 42.7 40.0 42.2  PLT 155 160 227 185    COAGS: No results for input(s): "INR", "APTT" in the last 8760 hours.  BMP: Recent Labs    11/10/22 1353 11/13/22 0628 11/14/22 0817 11/15/22 0800  NA 138 132* 134* 133*  K 4.4 4.2 4.3 4.8  CL 103 99 103 101  CO2 22 23 20* 23  GLUCOSE 95 99 97 131*  BUN _1 CALCIUM 9.1 8.8* 8.9 9.8  CREATININE 1.11 1.06 1.05 1.06  GFRNONAA >60 >60 >60 >60    LIVER FUNCTION TESTS: Recent Labs    10/21/22 1535 11/10/22 1353  BILITOT 0.6 1.2  AST 25 31  ALT 22 14  ALKPHOS  --  54  PROT 7.5 7.3  ALBUMIN  --  3.9    Assessment/Plan:   Chest mass confirmed to be lymphoma.  Patient transferring to [facility] to begin treatment.  IR asked to perform bone marrow biopsy and place port.  We had set up a time for the bone marrow here at [facility] for next week, but given transfer, will need to have it arranged at [facility].  CBC w/ diff will need to be drawn  same day as BM bx.  Port can be placed same day or on separate day given these are done in two different locations.  Will likely need to be left sided placement due to right neck/chest mass over the right clavicle. Patient voices understanding and is in agreement to proceed.    Consents to be signed at [facility].  Will make NPO Monday 12/4 0001 in hopes IR schedule can accommodate one or both of these.  SignedPasty Spillers 11/15/2022, 3:33 PM   I spent a total of  25 Minutes   in face to face in clinical consultation, greater than 50% of which was counseling/coordinating care for lymphoma.

## 2022-11-15 NOTE — Progress Notes (Signed)
[facility]  HEMATOLOGY/ONCOLOGY INPATIENT PROGRESS NOTE  Date of Service: 11/15/2022  Inpatient Attending: .Aline August, MD   SUBJECTIVE  Patient was seen in medical oncology follow-up for his newly diagnosed Burkitt's lymphoma. He notes that he is tolerating the dexamethasone well and notes decrease in size of his right neck and right chest wall masses. His uric acid level was elevated and he had a significantly elevated LDH level as expected. He has been started on allopurinol and was recommended to drink at least 2 L of water daily. Echocardiogram showed normal ejection fraction. PET CT scan,  bone marrow biopsy and PICC line placement pending. IR noted Port-A-Cath could not be done immediately therefore PICC line was ordered. Patient has not been transferred to [facility] awaiting starting chemotherapy hopefully on Monday. No fevers no chills no night sweats.  No new shortness of breath or chest pain..    OBJECTIVE:  NAD  PHYSICAL EXAMINATION: . Vitals:   11/15/22 0742 11/15/22 1548 11/15/22 1630 11/15/22 2153  BP: (!) 108/59 136/87 138/74 (!) 125/59  Pulse: 68 76 72 70  Resp: _0 Temp: 97.9 F (36.6 C) 98.2 F (36.8 C) 99.2 F (37.3 C) 98.4 F (36.9 C)  TempSrc: Oral Oral Oral Oral  SpO2: 100% 98% 99% 94%  Weight:      Height:       Filed Weights   11/11/22 0730 11/11/22 1755  Weight: 272 lb 0.8 oz (123.4 kg) 281 lb 8.4 oz (127.7 kg)   .Body mass index is 37.14 kg/m.  GENERAL:alert, in no acute distress and comfortable LYMPH: Right neck mass and right chest wall mass reduced in size LUNGS: clear to auscultation with normal respiratory effort HEART: regular rate & rhythm,  no murmurs and no lower extremity edema ABDOMEN: abdomen soft, non-tender, normoactive bowel sounds  NEURO: no focal motor/sensory deficits  MEDICAL HISTORY:  Past Medical History:  Diagnosis Date   Anal fissure    HIV infection ([place])    Internal hemorrhoids    Obesity     Rectal ulcer     SURGICAL HISTORY: History reviewed. No pertinent surgical history.  SOCIAL HISTORY: Social History   Socioeconomic History   Marital status: Single    Spouse name: Not on file   Number of children: Not on file   Years of education: Not on file   Highest education level: Not on file  Occupational History   Not on file  Tobacco Use   Smoking status: Never   Smokeless tobacco: Never  Vaping Use   Vaping Use: Never used  Substance and Sexual Activity   Alcohol use: No   Drug use: No   Sexual activity: Yes    Comment: given condoms  Other Topics Concern   Not on file  Social History Narrative   Not on file   Social Determinants of Health   Financial Resource Strain: Low Risk  (10/24/2022)   Overall Financial Resource Strain (CARDIA)    Difficulty of Paying Living Expenses: Not hard at all  Food Insecurity: No Food Insecurity (11/11/2022)   Hunger Vital Sign    Worried About Running Out of Food in the Last Year: Never true    [place] in the Last Year: Never true  Transportation Needs: No Transportation Needs (11/11/2022)   PRAPARE - Hydrologist (Medical): No    Lack of Transportation (Non-Medical): No  Physical Activity: Sufficiently Active (10/24/2022)   Exercise Vital  Sign    Days of Exercise per Week: 3 days    Minutes of Exercise per Session: 60 min  Recent Concern: Physical Activity - Insufficiently Active (10/24/2022)   Exercise Vital Sign    Days of Exercise per Week: 3 days    Minutes of Exercise per Session: 20 min  Stress: No Stress Concern Present (10/24/2022)   Sharon of Stress : Not at all  Social Connections: Unknown (10/24/2022)   Social Connection and Isolation Panel [NHANES]    Frequency of Communication with Friends and Family: Twice a week    Frequency of Social Gatherings with Friends and Family: Twice a week     Attends Religious Services: 1 to 4 times per year    Active Member of [employer] or Organizations: No    Attends Archivist Meetings: 1 to 4 times per year    Marital Status: Patient refused  Intimate Partner Violence: Not At Risk (11/11/2022)   Humiliation, Afraid, Rape, and Kick questionnaire    Fear of Current or Ex-Partner: No    Emotionally Abused: No    Physically Abused: No    Sexually Abused: No    FAMILY HISTORY: Family History  Problem Relation Age of Onset   Hypertension Father    Throat cancer Father    Heart attack Brother    Colon cancer Neg Hx    Esophageal cancer Neg Hx    Stomach cancer Neg Hx     ALLERGIES:  has No Known Allergies.  MEDICATIONS:  Scheduled Meds:  allopurinol  100 mg Oral BID   bictegravir-emtricitabine-tenofovir AF  1 tablet Oral Daily   dexamethasone  6 mg Oral BID WC   lidocaine (PF)  5 mL Intradermal Once   polyethylene glycol  17 g Oral Daily   senna-docusate  1 tablet Oral BID   sulfamethoxazole-trimethoprim  1 tablet Oral Daily   traMADol  100 mg Oral Q6H   Continuous Infusions: PRN Meds:.acetaminophen **OR** acetaminophen, bisacodyl, iohexol, magnesium hydroxide, ondansetron **OR** ondansetron (ZOFRAN) IV, sodium phosphate  REVIEW OF SYSTEMS:    10 Point review of Systems was done is negative except as noted above.   LABORATORY DATA:  I have reviewed the data as listed  .    Latest Ref Rng & Units 11/10/2022    1:53 PM 10/21/2022    3:35 PM 07/23/2022    9:18 AM  CBC  WBC 4.0 - 10.5 K/uL 4.8  4.0  2.1   Hemoglobin 13.0 - 17.0 g/dL 13.4  13.0  13.6   Hematocrit 39.0 - 52.0 % 42.2  40.0  42.7   Platelets 150 - 400 K/uL 185  227  160     .    Latest Ref Rng & Units 11/15/2022    8:00 AM 11/14/2022    8:17 AM 11/13/2022    6:28 AM  CMP  Glucose 70 - 99 mg/dL 131  97  99   BUN 6 - 20 mg/dL _0 Creatinine 0.61 - 1.24 mg/dL 1.06  1.05  1.06   Sodium 135 - 145 mmol/L 133  134  132   Potassium 3.5 - 5.1  mmol/L 4.8  4.3  4.2   Chloride 98 - 111 mmol/L 101  103  99   CO2 22 - 32 mmol/L _1 Calcium 8.9 - 10.3 mg/dL 9.8  8.9  8.8    .  Lab Results  Component Value Date   LDH 954 (H) 11/14/2022   Uric acid 9.1   RADIOGRAPHIC STUDIES: I have personally reviewed the radiological images as listed and agreed with the findings in the report. [place] EKG SITE RITE  Result Date: 11/15/2022 If Site Rite image not attached, placement could not be confirmed due to current cardiac rhythm.  ECHOCARDIOGRAM COMPLETE  Result Date: 11/14/2022    ECHOCARDIOGRAM REPORT   Patient Name:   SAFAL HALDERMAN Date of Exam: 11/14/2022 Medical Rec #:  [id number]      Height:       73.0 in Accession #:    [id number]     Weight:       281.5 lb Date of Birth:  [birth year]       BSA:          2.488 m Patient Age:    41 years       BP:           129/79 mmHg Patient Gender: M              HR:           75 bpm. Exam Location:  Inpatient Procedure: 2D Echo, Color Doppler, Cardiac Doppler and Intracardiac            Opacification Agent Indications:    Chemo Z09  History:        Patient has no prior history of Echocardiogram examinations.                 Chest Mass; Signs/Symptoms:Shortness of Breath.  Sonographer:    Greer Pickerel Referring Phys: [id number] Brunetta Genera  Sonographer Comments: Image acquisition challenging due to patient body habitus and Image acquisition challenging due to respiratory motion. Global longitudinal strain was attempted. IMPRESSIONS  1. Left ventricular ejection fraction, by estimation, is 60 to 65%. The left ventricle has normal function. The left ventricle has no regional wall motion abnormalities. There is mild left ventricular hypertrophy. Left ventricular diastolic parameters were normal.  2. Right ventricular systolic function is normal. The right ventricular size is normal. Tricuspid regurgitation signal is inadequate for assessing PA pressure.  3. The mitral valve is normal in structure. No  evidence of mitral valve regurgitation.  4. The aortic valve is tricuspid. Aortic valve regurgitation is not visualized.  5. The inferior vena cava is normal in size with greater than 50% respiratory variability, suggesting right atrial pressure of 3 mmHg. Comparison(s): No prior Echocardiogram. FINDINGS  Left Ventricle: Left ventricular ejection fraction, by estimation, is 60 to 65%. The left ventricle has normal function. The left ventricle has no regional wall motion abnormalities. Definity contrast agent was given IV to delineate the left ventricular  endocardial borders. The left ventricular internal cavity size was normal in size. There is mild left ventricular hypertrophy. Left ventricular diastolic parameters were normal. Right Ventricle: The right ventricular size is normal. No increase in right ventricular wall thickness. Right ventricular systolic function is normal. Tricuspid regurgitation signal is inadequate for assessing PA pressure. Left Atrium: Left atrial size was normal in size. Right Atrium: Right atrial size was normal in size. Pericardium: There is no evidence of pericardial effusion. Mitral Valve: The mitral valve is normal in structure. No evidence of mitral valve regurgitation. Tricuspid Valve: The tricuspid valve is normal in structure. Tricuspid valve regurgitation is not demonstrated. Aortic Valve: The aortic valve is tricuspid. Aortic valve regurgitation is not visualized. Pulmonic Valve: Pulmonic valve regurgitation is not  visualized. Aorta: The aortic root and ascending aorta are structurally normal, with no evidence of dilitation. Venous: The inferior vena cava is normal in size with greater than 50% respiratory variability, suggesting right atrial pressure of 3 mmHg. IAS/Shunts: No atrial level shunt detected by color flow Doppler.  LEFT VENTRICLE PLAX 2D LVIDd:         3.60 cm   Diastology LVIDs:         2.40 cm   LV e' medial:    6.85 cm/s LV PW:         1.40 cm   LV E/e' medial:   13.3 LV IVS:        0.80 cm   LV e' lateral:   12.70 cm/s LVOT diam:     2.10 cm   LV E/e' lateral: 7.1 LV SV:         77 LV SV Index:   31 LVOT Area:     3.46 cm  RIGHT VENTRICLE RV S prime:     17.70 cm/s TAPSE (M-mode): 2.8 cm LEFT ATRIUM           Index        RIGHT ATRIUM           Index LA diam:      3.10 cm 1.25 cm/m   RA Area:     16.80 cm LA Vol (A2C): 46.4 ml 18.65 ml/m  RA Volume:   40.80 ml  16.40 ml/m LA Vol (A4C): 36.6 ml 14.71 ml/m  AORTIC VALVE LVOT Vmax:   134.00 cm/s LVOT Vmean:  81.400 cm/s LVOT VTI:    0.223 m  AORTA Ao Root diam: 3.80 cm Ao Asc diam:  3.20 cm MITRAL VALVE MV Area (PHT): 3.63 cm    SHUNTS MV Decel Time: 209 msec    Systemic VTI:  0.22 m MR Peak grad: 3.7 mmHg     Systemic Diam: 2.10 cm MR Vmax:      96.60 cm/s MV E velocity: 90.80 cm/s MV A velocity: 76.70 cm/s MV E/A ratio:  1.18 Landscape architect signed by Phineas Inches Signature Date/Time: 11/14/2022/4:58:35 PM    Final    [place] CORE BIOPSY (SOFT TISSUE)  Result Date: 11/12/2022 INDICATION: 41 year old gentleman with history of lymphoma presents to IR for biopsy of right neck mass EXAM: Ultrasound-guided biopsy of right neck mass MEDICATIONS: None. ANESTHESIA/SEDATION: Moderate (conscious) sedation was employed during this procedure. A total of Versed 2.5 mg and Fentanyl 75 mcg was administered intravenously. Moderate Sedation Time: 10 minutes. The patient's level of consciousness and vital signs were monitored continuously by radiology nursing throughout the procedure under my direct supervision. COMPLICATIONS: None immediate. PROCEDURE: Informed written consent was obtained from the patient after a thorough discussion of the procedural risks, benefits and alternatives. All questions were addressed. Maximal Sterile Barrier Technique was utilized including caps, mask, sterile gowns, sterile gloves, sterile drape, hand hygiene and skin antiseptic. A timeout was performed prior to the initiation of the procedure.  Patient position supine on the ultrasound table. Right neck skin prepped and draped in usual sterile fashion. Following local lidocaine administration, 5- 18 gauge cores were obtained from the right neck mass utilizing continuous ultrasound guidance. Two cores were sent for Gram stain and culture in sterile saline. Three cores were sent for histologic analysis in formalin. Needle removed and hemostasis achieved with 5 minutes of manual compression. Post procedure ultrasound images showed no evidence of significant hemorrhage. IMPRESSION: Ultrasound-guided biopsy of right neck mass.  Electronically Signed   By: Miachel Roux M.D.   On: 11/12/2022 17:17   CT Soft Tissue Neck W Contrast  Result Date: 11/10/2022 CLINICAL DATA:  Neck mass with history of HIV. EXAM: CT NECK WITH CONTRAST TECHNIQUE: Multidetector CT imaging of the neck was performed using the standard protocol following the bolus administration of intravenous contrast. RADIATION DOSE REDUCTION: This exam was performed according to the departmental dose-optimization program which includes automated exposure control, adjustment of the mA and/or kV according to patient size and/or use of iterative reconstruction technique. CONTRAST:  139m OMNIPAQUE IOHEXOL 350 MG/ML SOLN COMPARISON:  CT neck 6 days ago. FINDINGS: Pharynx and larynx: The nasal cavity and nasopharynx are unremarkable. The oral cavity and oropharynx are unremarkable. The parapharyngeal spaces are clear. The hypopharynx and larynx are unremarkable. The vocal folds are normal in appearance. There is no retropharyngeal fluid collection.  The airway is patent. Salivary glands: The parotid and submandibular glands are unremarkable. Thyroid: Unremarkable. Lymph nodes: There is enlarged centrally necrotic right level I lymph node measuring up to 2.1 cm in short axis, similar to the CT from 6 days ago. The large infiltrative centrally necrotic soft tissue mass throughout the right neck measuring up  to approximately 8.1 cm TV x 5.9 cm AP by 7.4 cm cc is also not significantly changed in size. Inferior extent into the upper chest anterior to the thyroid with centrally necrotic appearing tissue measuring 4.9 cm x 5.4 cm is unchanged when measured again using similar technique. Overlying skin thickening is similar. The mass is inseparable from the sternocleidomastoid muscle. There are numerous additional prominent right-sided cervical chain lymph nodes measuring up to 1.0 cm at level IIA (3-38), and 1.1 cm in short axis in the posterior triangle (3-43). Vascular: The major vasculature of the neck is unremarkable. Limited intracranial: The imaged portions of the posterior fossa are unremarkable. Visualized orbits: Not included within the field of view. Mastoids and visualized paranasal sinuses: Clear. Skeleton: There is no acute osseous abnormality or suspicious osseous lesion. Upper chest: Assessed on the separately dictated CT chest. Other: None. IMPRESSION: No significant interval change since the CT neck from 6 days prior. Unchanged infiltrative soft tissue mass in the right neck extending to the upper chest anterior to the clavicle with central necrosis and surrounding lymphadenopathy. Differential again includes malignancy (sarcoma, lymphoma), or infection (including TB. Sampling is recommended. Electronically Signed   By: PValetta MoleM.D.   On: 11/10/2022 16:27   CT CHEST ABDOMEN PELVIS W CONTRAST  Result Date: 11/10/2022 CLINICAL DATA:  Lymphoma, neck mass, metastatic disease evaluation * Tracking Code: BO * EXAM: CT CHEST, ABDOMEN, AND PELVIS WITH CONTRAST TECHNIQUE: Multidetector CT imaging of the chest, abdomen and pelvis was performed following the standard protocol during bolus administration of intravenous contrast. RADIATION DOSE REDUCTION: This exam was performed according to the departmental dose-optimization program which includes automated exposure control, adjustment of the mA and/or kV  according to patient size and/or use of iterative reconstruction technique. CONTRAST:  1064mOMNIPAQUE IOHEXOL 350 MG/ML SOLN COMPARISON:  CT neck, 11/04/2022, CT pelvis, 02/09/2017 FINDINGS: CT CHEST FINDINGS Cardiovascular: No significant vascular findings. Normal heart size. No pericardial effusion. Mediastinum/Nodes: Enlarged right lower cervical, supraclavicular, and bilateral axillary lymph nodes, largest right axillary nodes measuring up to 2.2 x 1.7 cm (series 3, image 18). Diffuse fat stranding throughout the superior mediastinum (series 3, image 19), thyroid gland, trachea, and esophagus demonstrate no significant findings. Lungs/Pleura: Lungs are clear. No pleural effusion or pneumothorax.  Musculoskeletal: Partially imaged, heterogeneous mass centered in the anterior right neck, incompletely imaged and better assessed by simultaneous dedicated examination of the neck (series 3, image 1). No acute osseous findings. CT ABDOMEN PELVIS FINDINGS Hepatobiliary: No solid liver abnormality is seen. No gallstones, gallbladder wall thickening, or biliary dilatation. Pancreas: Unremarkable. No pancreatic ductal dilatation or surrounding inflammatory changes. Spleen: Normal in size without significant abnormality. Adrenals/Urinary Tract: Adrenal glands are unremarkable. Kidneys are normal, without renal calculi, solid lesion, or hydronephrosis. Bladder is unremarkable. Stomach/Bowel: Stomach is within normal limits. Appendix appears normal. No evidence of bowel wall thickening, distention, or inflammatory changes. Vascular/Lymphatic: No significant vascular findings are present. Prominent subcentimeter retroperitoneal and iliac lymph nodes, for example a right external iliac node measuring 1.1 x 0.8 cm (series 3, image 102). Reproductive: No mass or other abnormality. Other: No abdominal wall hernia or abnormality. No ascites. Diffuse retroperitoneal fat stranding (series 3, image 80). Musculoskeletal: No acute  osseous findings. IMPRESSION: 1. Partially imaged, heterogeneous mass centered in the anterior right neck, incompletely imaged and better assessed by simultaneous dedicated examination of the neck. As previously reported, general differential considerations include both malignancy and infectious process such as scrofula. 2. Enlarged right lower cervical, supraclavicular, and bilateral axillary lymph nodes, which may be reactive although are generally concerning for metastatic disease. 3. Prominent subcentimeter retroperitoneal and iliac lymph nodes, nonspecific although as above concerning for additional nodal metastatic disease. 4. Diffuse fat stranding throughout the superior mediastinum, of uncertain nature, possibly reflecting mediastinal infectious involvement or alternately vascular/lymphatic congestion. Prior thoracic radiotherapy could likewise have this appearance. 5. Diffuse retroperitoneal fat stranding, as above of uncertain significance, possibly infectious or inflammatory, for example incidental and related to pancreatitis, or as above perhaps related to prior abdominal radiotherapy. 6. No mass or evidence of organ metastatic disease in the abdomen or pelvis. Electronically Signed   By: Delanna Ahmadi M.D.   On: 11/10/2022 16:26   DG Chest 2 View  Result Date: 11/10/2022 CLINICAL DATA:  Dyspnea EXAM: CHEST - 2 VIEW COMPARISON:  06/27/2016 FINDINGS: Small areas of consolidation or volume loss identified right middle lobe and lingula. No pneumothorax or pleural effusion. Normal pulmonary vasculature. Unremarkable cardiac silhouette. Unremarkable osseous structures. IMPRESSION: Medial bibasilar subsegmental atelectasis or consolidation. Electronically Signed   By: Sammie Bench M.D.   On: 11/10/2022 14:53   CT SOFT TISSUE NECK W CONTRAST  Result Date: 11/05/2022 CLINICAL DATA:  Neck mass. EXAM: CT NECK WITH CONTRAST TECHNIQUE: Multidetector CT imaging of the neck was performed using the  standard protocol following the bolus administration of intravenous contrast. RADIATION DOSE REDUCTION: This exam was performed according to the departmental dose-optimization program which includes automated exposure control, adjustment of the mA and/or kV according to patient size and/or use of iterative reconstruction technique. CONTRAST:  41m OMNIPAQUE IOHEXOL 350 MG/ML SOLN COMPARISON:  CT Neck 07/23/22 FINDINGS: Pharynx and larynx: Normal. No mass or swelling. Salivary glands: No inflammation, mass, or stone. Thyroid: Normal. Lymph nodes: There are prominent lymph nodes, for example a 1.2 cm level 1 a lymph node, unchanged from prior exam. There is also a prominent 8 mm right level 3 lymph, slightly increased in size from prior exam. Along the inferior aspect of the right submandibular gland there is a 2.8 x 1.8 cm lesion with central hypodensity (series 3, image 56) which could represent a centrally necrotic lymph node. Vascular: There is mass effect on the right internal jugular vein (series 3, image 68). Limited intracranial: Negative. Visualized orbits: Negative.  Mastoids and visualized paranasal sinuses: Clear. Skeleton: No acute or aggressive process. Upper chest: See below.  No focal pulmonary nodule is visualized. Other: There is marked interval worsening in the degree soft tissue abnormality seen in the right neck compared to prior exam dated 07/23/2022. There is now a large soft tissue mass along the anterior chest wall, adjacent to the sternoclavicular joint on the right measuring 4.2 x 3 4 x 5.6 cm (series 3, image 5). There is infiltrative soft tissue surrounding this mass that extends superiorly along the supraclavicular region to the level of the hyoid bone and submandibular region (series 3, image 64). Inferiorly this mass likely now also extends into the mediastinum where there is soft tissue stranding (series 3, image 115). Infiltrative soft tissues also seen along the left supraclavicular  region (series 3, image 77), but to a lesser degree than the right. Portions of this mass appear to have a cutaneous component, for example along the right aspect of the chin (series 3, image 59), as well as the portion adjacent to the right sternoclavicular joint (series 3, image 79). IMPRESSION: 1. Marked interval worsening in the degree of soft tissue abnormality seen in the right neck compared to prior exam dated 07/23/2022. There is now a multi spatial infiltrative soft tissue mass involving a large portion of the right neck. Inferiorly this mass extends into the mediastinum and superiorly this mass extends to the submandibular region. Infiltrative soft tissue is also seen along the left supraclavicular region. Portions of this mass appear to have a cutaneous component, for example along the right aspect of the chin, as well as the right sternoclavicular joint. Findings are concerning for malignancy, with differential considerations including lymphoma or sarcoma. Tissue sampling is recommended. Additionally CT of the chest is also recommended to fully characterize the mediastinal extent of this mass. 2. 2.8 x 1.8 cm lesion with central hypodensity along the inferior aspect of the right submandibular gland could represent a centrally necrotic lymph node. Electronically Signed   By: Marin Roberts M.D.   On: 11/05/2022 10:29    ASSESSMENT & PLAN:         SURGICAL PATHOLOGY CASE: [id number] PATIENT: [facility] Surgical Pathology Report     Clinical History: right neck mass (cm)     FINAL MICROSCOPIC DIAGNOSIS:  A. LYMPH NODE, RIGHT NECK, NEEDLE CORE BIOPSY: -  Consistent with a B-cell lymphoma with high-grade features, see note  Note: The needle core biopsies consist predominantly of geographic tumoral type necrosis.  Focally there are sheets of moderate-sized CD20/PAX5 positive lymphoid cells that have a high apoptotic/mitotic index with a "starry sky" appearance.  Additionally,  while there is limited material the cells of interest are positive for CD10 and BCL6 and negative for both Bcl-2 and MUM1.  CD3/CD5 highlights background T cells CD138, CD30, cyclin D1 and EBV ISH are negative.  In the areas of viability the proliferation rate is greater than 90%.  While the evaluation is limited due to the extensive necrosis the overall findings are consistent with a B-cell lymphoma and most consistent with a Burkitt lymphoma.  FISH testing for confirmatory purposes will be attempted; however, given the extensive necrosis there may be insufficient material.  GROSS DESCRIPTION:  The specimen is received in formalin and consists of 3 cores of tan-red soft tissue, ranging from 1.4 to 1.6 cm in length by 0.1 cm in diameter. The specimen is entirely submitted in 1 cassette.  Craig Staggers 11/12/2022)    Final Diagnosis performed  by Tilford Pillar DO.   Electronically signed 11/14/2022          RADIOGRAPHIC STUDIES: I have personally reviewed the radiological images as listed and agreed with the findings in the report.  Imaging Results  [place] CORE BIOPSY (SOFT TISSUE)   Result Date: 11/12/2022 INDICATION: 41 year old gentleman with history of lymphoma presents to IR for biopsy of right neck mass EXAM: Ultrasound-guided biopsy of right neck mass MEDICATIONS: None. ANESTHESIA/SEDATION: Moderate (conscious) sedation was employed during this procedure. A total of Versed 2.5 mg and Fentanyl 75 mcg was administered intravenously. Moderate Sedation Time: 10 minutes. The patient's level of consciousness and vital signs were monitored continuously by radiology nursing throughout the procedure under my direct supervision. COMPLICATIONS: None immediate. PROCEDURE: Informed written consent was obtained from the patient after a thorough discussion of the procedural risks, benefits and alternatives. All questions were addressed. Maximal Sterile Barrier Technique was utilized including caps, mask,  sterile gowns, sterile gloves, sterile drape, hand hygiene and skin antiseptic. A timeout was performed prior to the initiation of the procedure. Patient position supine on the ultrasound table. Right neck skin prepped and draped in usual sterile fashion. Following local lidocaine administration, 5- 18 gauge cores were obtained from the right neck mass utilizing continuous ultrasound guidance. Two cores were sent for Gram stain and culture in sterile saline. Three cores were sent for histologic analysis in formalin. Needle removed and hemostasis achieved with 5 minutes of manual compression. Post procedure ultrasound images showed no evidence of significant hemorrhage. IMPRESSION: Ultrasound-guided biopsy of right neck mass. Electronically Signed   By: Miachel Roux M.D.   On: 11/12/2022 17:17    CT Soft Tissue Neck W Contrast   Result Date: 11/10/2022 CLINICAL DATA:  Neck mass with history of HIV. EXAM: CT NECK WITH CONTRAST TECHNIQUE: Multidetector CT imaging of the neck was performed using the standard protocol following the bolus administration of intravenous contrast. RADIATION DOSE REDUCTION: This exam was performed according to the departmental dose-optimization program which includes automated exposure control, adjustment of the mA and/or kV according to patient size and/or use of iterative reconstruction technique. CONTRAST:  139m OMNIPAQUE IOHEXOL 350 MG/ML SOLN COMPARISON:  CT neck 6 days ago. FINDINGS: Pharynx and larynx: The nasal cavity and nasopharynx are unremarkable. The oral cavity and oropharynx are unremarkable. The parapharyngeal spaces are clear. The hypopharynx and larynx are unremarkable. The vocal folds are normal in appearance. There is no retropharyngeal fluid collection.  The airway is patent. Salivary glands: The parotid and submandibular glands are unremarkable. Thyroid: Unremarkable. Lymph nodes: There is enlarged centrally necrotic right level I lymph node measuring up to 2.1 cm  in short axis, similar to the CT from 6 days ago. The large infiltrative centrally necrotic soft tissue mass throughout the right neck measuring up to approximately 8.1 cm TV x 5.9 cm AP by 7.4 cm cc is also not significantly changed in size. Inferior extent into the upper chest anterior to the thyroid with centrally necrotic appearing tissue measuring 4.9 cm x 5.4 cm is unchanged when measured again using similar technique. Overlying skin thickening is similar. The mass is inseparable from the sternocleidomastoid muscle. There are numerous additional prominent right-sided cervical chain lymph nodes measuring up to 1.0 cm at level IIA (3-38), and 1.1 cm in short axis in the posterior triangle (3-43). Vascular: The major vasculature of the neck is unremarkable. Limited intracranial: The imaged portions of the posterior fossa are unremarkable. Visualized orbits: Not included within the field of  view. Mastoids and visualized paranasal sinuses: Clear. Skeleton: There is no acute osseous abnormality or suspicious osseous lesion. Upper chest: Assessed on the separately dictated CT chest. Other: None. IMPRESSION: No significant interval change since the CT neck from 6 days prior. Unchanged infiltrative soft tissue mass in the right neck extending to the upper chest anterior to the clavicle with central necrosis and surrounding lymphadenopathy. Differential again includes malignancy (sarcoma, lymphoma), or infection (including TB. Sampling is recommended. Electronically Signed   By: Valetta Mole M.D.   On: 11/10/2022 16:27    CT CHEST ABDOMEN PELVIS W CONTRAST   Result Date: 11/10/2022 CLINICAL DATA:  Lymphoma, neck mass, metastatic disease evaluation * Tracking Code: BO * EXAM: CT CHEST, ABDOMEN, AND PELVIS WITH CONTRAST TECHNIQUE: Multidetector CT imaging of the chest, abdomen and pelvis was performed following the standard protocol during bolus administration of intravenous contrast. RADIATION DOSE REDUCTION: This  exam was performed according to the departmental dose-optimization program which includes automated exposure control, adjustment of the mA and/or kV according to patient size and/or use of iterative reconstruction technique. CONTRAST:  185m OMNIPAQUE IOHEXOL 350 MG/ML SOLN COMPARISON:  CT neck, 11/04/2022, CT pelvis, 02/09/2017 FINDINGS: CT CHEST FINDINGS Cardiovascular: No significant vascular findings. Normal heart size. No pericardial effusion. Mediastinum/Nodes: Enlarged right lower cervical, supraclavicular, and bilateral axillary lymph nodes, largest right axillary nodes measuring up to 2.2 x 1.7 cm (series 3, image 18). Diffuse fat stranding throughout the superior mediastinum (series 3, image 19), thyroid gland, trachea, and esophagus demonstrate no significant findings. Lungs/Pleura: Lungs are clear. No pleural effusion or pneumothorax. Musculoskeletal: Partially imaged, heterogeneous mass centered in the anterior right neck, incompletely imaged and better assessed by simultaneous dedicated examination of the neck (series 3, image 1). No acute osseous findings. CT ABDOMEN PELVIS FINDINGS Hepatobiliary: No solid liver abnormality is seen. No gallstones, gallbladder wall thickening, or biliary dilatation. Pancreas: Unremarkable. No pancreatic ductal dilatation or surrounding inflammatory changes. Spleen: Normal in size without significant abnormality. Adrenals/Urinary Tract: Adrenal glands are unremarkable. Kidneys are normal, without renal calculi, solid lesion, or hydronephrosis. Bladder is unremarkable. Stomach/Bowel: Stomach is within normal limits. Appendix appears normal. No evidence of bowel wall thickening, distention, or inflammatory changes. Vascular/Lymphatic: No significant vascular findings are present. Prominent subcentimeter retroperitoneal and iliac lymph nodes, for example a right external iliac node measuring 1.1 x 0.8 cm (series 3, image 102). Reproductive: No mass or other abnormality.  Other: No abdominal wall hernia or abnormality. No ascites. Diffuse retroperitoneal fat stranding (series 3, image 80). Musculoskeletal: No acute osseous findings. IMPRESSION: 1. Partially imaged, heterogeneous mass centered in the anterior right neck, incompletely imaged and better assessed by simultaneous dedicated examination of the neck. As previously reported, general differential considerations include both malignancy and infectious process such as scrofula. 2. Enlarged right lower cervical, supraclavicular, and bilateral axillary lymph nodes, which may be reactive although are generally concerning for metastatic disease. 3. Prominent subcentimeter retroperitoneal and iliac lymph nodes, nonspecific although as above concerning for additional nodal metastatic disease. 4. Diffuse fat stranding throughout the superior mediastinum, of uncertain nature, possibly reflecting mediastinal infectious involvement or alternately vascular/lymphatic congestion. Prior thoracic radiotherapy could likewise have this appearance. 5. Diffuse retroperitoneal fat stranding, as above of uncertain significance, possibly infectious or inflammatory, for example incidental and related to pancreatitis, or as above perhaps related to prior abdominal radiotherapy. 6. No mass or evidence of organ metastatic disease in the abdomen or pelvis. Electronically Signed   By: ADelanna AhmadiM.D.   On: 11/10/2022  16:26    DG Chest 2 View   Result Date: 11/10/2022 CLINICAL DATA:  Dyspnea EXAM: CHEST - 2 VIEW COMPARISON:  06/27/2016 FINDINGS: Small areas of consolidation or volume loss identified right middle lobe and lingula. No pneumothorax or pleural effusion. Normal pulmonary vasculature. Unremarkable cardiac silhouette. Unremarkable osseous structures. IMPRESSION: Medial bibasilar subsegmental atelectasis or consolidation. Electronically Signed   By: Sammie Bench M.D.   On: 11/10/2022 14:53    CT SOFT TISSUE NECK W CONTRAST   Result  Date: 11/05/2022 CLINICAL DATA:  Neck mass. EXAM: CT NECK WITH CONTRAST TECHNIQUE: Multidetector CT imaging of the neck was performed using the standard protocol following the bolus administration of intravenous contrast. RADIATION DOSE REDUCTION: This exam was performed according to the departmental dose-optimization program which includes automated exposure control, adjustment of the mA and/or kV according to patient size and/or use of iterative reconstruction technique. CONTRAST:  17m OMNIPAQUE IOHEXOL 350 MG/ML SOLN COMPARISON:  CT Neck 07/23/22 FINDINGS: Pharynx and larynx: Normal. No mass or swelling. Salivary glands: No inflammation, mass, or stone. Thyroid: Normal. Lymph nodes: There are prominent lymph nodes, for example a 1.2 cm level 1 a lymph node, unchanged from prior exam. There is also a prominent 8 mm right level 3 lymph, slightly increased in size from prior exam. Along the inferior aspect of the right submandibular gland there is a 2.8 x 1.8 cm lesion with central hypodensity (series 3, image 56) which could represent a centrally necrotic lymph node. Vascular: There is mass effect on the right internal jugular vein (series 3, image 68). Limited intracranial: Negative. Visualized orbits: Negative. Mastoids and visualized paranasal sinuses: Clear. Skeleton: No acute or aggressive process. Upper chest: See below.  No focal pulmonary nodule is visualized. Other: There is marked interval worsening in the degree soft tissue abnormality seen in the right neck compared to prior exam dated 07/23/2022. There is now a large soft tissue mass along the anterior chest wall, adjacent to the sternoclavicular joint on the right measuring 4.2 x 3 4 x 5.6 cm (series 3, image 5). There is infiltrative soft tissue surrounding this mass that extends superiorly along the supraclavicular region to the level of the hyoid bone and submandibular region (series 3, image 64). Inferiorly this mass likely now also extends into  the mediastinum where there is soft tissue stranding (series 3, image 115). Infiltrative soft tissues also seen along the left supraclavicular region (series 3, image 77), but to a lesser degree than the right. Portions of this mass appear to have a cutaneous component, for example along the right aspect of the chin (series 3, image 59), as well as the portion adjacent to the right sternoclavicular joint (series 3, image 79). IMPRESSION: 1. Marked interval worsening in the degree of soft tissue abnormality seen in the right neck compared to prior exam dated 07/23/2022. There is now a multi spatial infiltrative soft tissue mass involving a large portion of the right neck. Inferiorly this mass extends into the mediastinum and superiorly this mass extends to the submandibular region. Infiltrative soft tissue is also seen along the left supraclavicular region. Portions of this mass appear to have a cutaneous component, for example along the right aspect of the chin, as well as the right sternoclavicular joint. Findings are concerning for malignancy, with differential considerations including lymphoma or sarcoma. Tissue sampling is recommended. Additionally CT of the chest is also recommended to fully characterize the mediastinal extent of this mass. 2. 2.8 x 1.8 cm lesion with central  hypodensity along the inferior aspect of the right submandibular gland could represent a centrally necrotic lymph node. Electronically Signed   By: Marin Roberts M.D.   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 Concerning for at least stage II possibly stage III disease. Some borderline lymphadenopathy in the abdomen could also be from  his recently diagnosed HIV/AIDS. No clinical symptoms suggestive of CNS involvement at this time. No constitutional symptoms. No significant cytopenias to suggest bone marrow involvement.   First presented in August at [facility] and was treated empirically with steroids and antibiotics with improvement in swelling prior to additional progression.   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  Patient reports this was completely treated.   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  On allopurinol for TLS prophylaxis Plan -Patient's labs were reviewed with him in detail including signs of tumor lysis syndrome uric acid 9.1.  LDH significantly elevated from his Burkitt's lymphoma. -Patient notes clinical improvement in his right cervical and right upper chest wall lymphadenopathy from his dexamethasone.  She will continue with the 6 mg twice daily. -Continue allopurinol for tumor lysis syndrome prophylaxis. --PICC line placement.  Discussed with IR they are unable to place the port in a relatively short time..  Will eventually need a Port-A-Cath placement. -Echocardiogram shows normal ejection fraction .  CT scan ordered and currently pending CT-guided bone marrow aspiration and biopsy currently pending -Daily CBC, CMP and uric acid. -Will plan to do diagnostic lumbar puncture and first dose of methotrexate for CNS prophylaxis while at [facility]. -Patient notes that he would not want his HIV/AIDS diagnosis to be shared with any family members and at this time notes that if he loses decisional capacity he would not want his wife to make decisions on his behalf but prefers for his mother to make those decisions. -He prefers to be the one to share information with his other family members. -On Bactrim for PJP prophylaxis. -Plan to start [place] on Monday, 11/18/2022. -Patient's questions were  answered and he was given reading material regarding Burkitt's lymphoma. -Oncology will continue to follow-will see patient on Monday.    The total time spent in the appointment was 50 minutes*.  All of the patient's questions were answered with apparent satisfaction. The patient knows to call the clinic with any problems, questions or concerns.   Sullivan Lone MD MS AAHIVMS [facility] Hematology/Oncology Physician [facility]  .*Total Encounter Time as defined by the Centers for Medicare and Medicaid Services includes, in addition to the face-to-face time of a patient visit (documented in the note above) non-face-to-face time: obtaining and reviewing outside history, ordering and reviewing medications, tests or procedures, care coordination (communications with other health care professionals or caregivers) and documentation in the medical record.

## 2022-11-15 NOTE — Progress Notes (Signed)
Report called to [employer] at [facility], patient awaiting transfer.

## 2022-11-15 NOTE — Progress Notes (Signed)
PROGRESS NOTE    Logan Ward  BHA:[id number] DOB: 1981/07/07 DOA: 11/10/2022 PCP: Pcp, No   Brief Narrative:  41 y.o. male with medical history of HIV/AIDS and syphilis presented with a rapidly growing mass in the right anterior chest wall.  On presentation, CT scan revealed 8 cm x 5.9 cm x 7.4 cm soft tissue mass with centralized necrosis extending anterior to the thyroid and into the upper chest with surrounding lymphadenopathy.  ID/cardiothoracic surgery/ENT/IR were consulted.  He underwent ultrasound-guided biopsy of the right neck mass by IR on 11/12/2022.  IV antibiotics were subsequently started after the biopsy.  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  Oncology was consulted  Assessment & Plan:   New diagnosis of Burkitt's lymphoma Soft tissue mass in the right neck/chest -Imaging as above. -ID/cardiothoracic surgery/ENT/IR were consulted.  He underwent ultrasound-guided biopsy of the right neck mass by IR on 11/12/2022.  IV antibiotics were subsequently started after the biopsy but discontinued on 11/14/2022 by ID after pathology resulted.  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  Oncology has been consulted and is recommending bone marrow biopsy/inpatient PET scan/port placement and transfer to [facility].  Will initiate transfer to [facility]. -Patient has been started on allopurinol by oncology.  HIV/AIDS -CD4 count on 10/21/2022 was 501 -ID following.  Continue Biktarvy and Bactrim  Hyponatremia -Mild.  Encourage oral intake.  No labs today.  History of syphilis  Obesity -Outpatient follow-up  DVT prophylaxis: SCDs Code Status: Full Family Communication: None at bedside Disposition Plan: Status is: Inpatient Remains inpatient appropriate because: Of severity of illness  Consultants: ID/CT surgery/ENT/IR/oncology  Procedures: As above  Antimicrobials:  Anti-infectives (From admission, onward)     Start     Dose/Rate Route Frequency Ordered Stop   11/14/22 1015  amoxicillin-clavulanate (AUGMENTIN) 875-125 MG per tablet 1 tablet  Status:  Discontinued        1 tablet Oral Every 12 hours 11/14/22 0929 11/14/22 1541   11/14/22 1015  doxycycline (VIBRA-TABS) tablet 100 mg  Status:  Discontinued        100 mg Oral Every 12 hours 11/14/22 0929 11/14/22 1541   11/14/22 0000  amoxicillin-clavulanate (AUGMENTIN) 875-125 MG tablet  Status:  Discontinued        1 tablet Oral 2 times daily 11/14/22 0923 11/14/22    11/14/22 0000  doxycycline (VIBRAMYCIN) 100 MG capsule  Status:  Discontinued        100 mg Oral 2 times daily 11/14/22 0923 11/14/22    11/13/22 0200  vancomycin (VANCOREADY) IVPB 1500 mg/300 mL  Status:  Discontinued        1,500 mg 150 mL/hr over 120 Minutes Intravenous Every 12 hours 11/12/22 1245 11/14/22 0929   11/12/22 1400  vancomycin (VANCOREADY) IVPB 2000 mg/400 mL        2,000 mg 200 mL/hr over 120 Minutes Intravenous  Once 11/12/22 1245 11/12/22 1738   11/12/22 1330  Ampicillin-Sulbactam (UNASYN) 3 g in sodium chloride 0.9 % 100 mL IVPB  Status:  Discontinued        3 g 200 mL/hr over 30 Minutes Intravenous Every 6 hours 11/12/22 1238 11/14/22 0929   11/10/22 1900  bictegravir-emtricitabine-tenofovir AF (BIKTARVY) 50-200-25 MG per tablet 1 tablet        1 tablet Oral Daily 11/10/22 1744     11/10/22 1745  sulfamethoxazole-trimethoprim (BACTRIM DS) 800-160 MG per tablet 1 tablet        1 tablet Oral  Daily 11/10/22 1744          Subjective: Patient seen and examined at bedside.  Denies worsening shortness breath, fever, vomiting.  Still complains of intermittent right neck and chest wall pain.   Objective: Vitals:   11/14/22 1128 11/14/22 1611 11/15/22 0415 11/15/22 0742  BP: 115/83 129/76 108/66 (!) 108/59  Pulse: 93 79  68  Resp: _0 Temp: 98.4 F (36.9 C)  98.1 F (36.7 C) 97.9 F (36.6 C)  TempSrc: Oral  Oral Oral  SpO2: 98% 100% 100% 100%   Weight:      Height:        Intake/Output Summary (Last 24 hours) at 11/15/2022 0800 Last data filed at 11/14/2022 0900 Gross per 24 hour  Intake 240 ml  Output --  Net 240 ml    Filed Weights   11/11/22 0730 11/11/22 1755  Weight: 123.4 kg 127.7 kg    Examination:  General exam: Currently on room air.  No distress currently.   ENT: Right neck/chest wall mass noted. Respiratory system: Bilateral decreased breath sounds at bases with some crackles  cardiovascular system: S1-S2 heard; mostly rate controlled  gastrointestinal system: Abdomen is obese, mildly distended; soft and nontender.  Normal bowel sounds are heard extremities: No cyanosis or clubbing  Data Reviewed: I have personally reviewed following labs and imaging studies  CBC: Recent Labs  Lab 11/10/22 1353  WBC 4.8  NEUTROABS 1.7  HGB 13.4  HCT 42.2  MCV 86.1  PLT 865    Basic Metabolic Panel: Recent Labs  Lab 11/10/22 1353 11/13/22 0628 11/14/22 0817  NA 138 132* 134*  K 4.4 4.2 4.3  CL 103 99 103  CO2 22 23 20*  GLUCOSE 95 99 97  BUN _1 CREATININE 1.11 1.06 1.05  CALCIUM 9.1 8.8* 8.9    GFR: Estimated Creatinine Clearance: 132.3 mL/min (by C-G formula based on SCr of 1.05 mg/dL). Liver Function Tests: Recent Labs  Lab 11/10/22 1353  AST 31  ALT 14  ALKPHOS 54  BILITOT 1.2  PROT 7.3  ALBUMIN 3.9    No results for input(s): "LIPASE", "AMYLASE" in the last 168 hours. No results for input(s): "AMMONIA" in the last 168 hours. Coagulation Profile: No results for input(s): "INR", "PROTIME" in the last 168 hours. Cardiac Enzymes: No results for input(s): "CKTOTAL", "CKMB", "CKMBINDEX", "TROPONINI" in the last 168 hours. BNP (last 3 results) No results for input(s): "PROBNP" in the last 8760 hours. HbA1C: No results for input(s): "HGBA1C" in the last 72 hours. CBG: No results for input(s): "GLUCAP" in the last 168 hours. Lipid Profile: No results for input(s): "CHOL", "HDL",  "LDLCALC", "TRIG", "CHOLHDL", "LDLDIRECT" in the last 72 hours. Thyroid Function Tests: No results for input(s): "TSH", "T4TOTAL", "FREET4", "T3FREE", "THYROIDAB" in the last 72 hours. Anemia Panel: No results for input(s): "VITAMINB12", "FOLATE", "FERRITIN", "TIBC", "IRON", "RETICCTPCT" in the last 72 hours. Sepsis Labs: No results for input(s): "PROCALCITON", "LATICACIDVEN" in the last 168 hours.  Recent Results (from the past 240 hour(s))  Resp Panel by RT-PCR (Flu A&B, Covid) Anterior Nasal Swab     Status: None   Collection Time: 11/10/22  2:03 PM   Specimen: Anterior Nasal Swab  Result Value Ref Range Status   SARS Coronavirus 2 by RT PCR NEGATIVE NEGATIVE Final    Comment: (NOTE) SARS-CoV-2 target nucleic acids are NOT DETECTED.  The SARS-CoV-2 RNA is generally detectable in upper respiratory specimens during the acute phase of infection.  The lowest concentration of SARS-CoV-2 viral copies this assay can detect is 138 copies/mL. A negative result does not preclude SARS-Cov-2 infection and should not be used as the sole basis for treatment or other patient management decisions. A negative result may occur with  improper specimen collection/handling, submission of specimen other than nasopharyngeal swab, presence of viral mutation(s) within the areas targeted by this assay, and inadequate number of viral copies(<138 copies/mL). A negative result must be combined with clinical observations, patient history, and epidemiological information. The expected result is Negative.  Fact Sheet for Patients:  EntrepreneurPulse.com.au  Fact Sheet for Healthcare Providers:  IncredibleEmployment.be  This test is no t yet approved or cleared by the [place] FDA and  has been authorized for detection and/or diagnosis of SARS-CoV-2 by FDA under an Emergency Use Authorization (EUA). This EUA will remain  in effect (meaning this test can be used) for  the duration of the COVID-19 declaration under Section 564(b)(1) of the Act, 21 U.S.C.section 360bbb-3(b)(1), unless the authorization is terminated  or revoked sooner.       Influenza A by PCR NEGATIVE NEGATIVE Final   Influenza B by PCR NEGATIVE NEGATIVE Final    Comment: (NOTE) The Xpert Xpress SARS-CoV-2/FLU/RSV plus assay is intended as an aid in the diagnosis of influenza from Nasopharyngeal swab specimens and should not be used as a sole basis for treatment. Nasal washings and aspirates are unacceptable for Xpert Xpress SARS-CoV-2/FLU/RSV testing.  Fact Sheet for Patients: EntrepreneurPulse.com.au  Fact Sheet for Healthcare Providers: IncredibleEmployment.be  This test is not yet approved or cleared by the [place] FDA and has been authorized for detection and/or diagnosis of SARS-CoV-2 by FDA under an Emergency Use Authorization (EUA). This EUA will remain in effect (meaning this test can be used) for the duration of the COVID-19 declaration under Section 564(b)(1) of the Act, 21 U.S.C. section 360bbb-3(b)(1), unless the authorization is terminated or revoked.  Performed at [facility], [place] [address] [place], [place] [address]   Aerobic/Anaerobic Culture w Gram Stain (surgical/deep wound)     Status: None (Preliminary result)   Collection Time: 11/12/22 12:16 PM   Specimen: Tissue  Result Value Ref Range Status   Specimen Description TISSUE  Final   Special Requests NONE  Final   Gram Stain NO WBC SEEN NO ORGANISMS SEEN   Final   Culture   Final    NO GROWTH 2 DAYS Performed at [facility], [place] [address], [place], [place] [address]    Report Status PENDING  Incomplete  Acid Fast Smear (AFB)     Status: None   Collection Time: 11/12/22 12:16 PM   Specimen: Soft Tissue Mass Excision  Result Value Ref Range Status   AFB Specimen Processing Concentration  Final   Acid Fast Smear Negative  Final     Comment: (NOTE) Performed At: [facility] [place], [place] [id number] Rush Farmer MD KC:[id number]    Source (AFB) TISSUE  Final    Comment: Performed at [facility], [place] [address], [place], [place] [address]         Radiology Studies: ECHOCARDIOGRAM COMPLETE  Result Date: 11/14/2022    ECHOCARDIOGRAM REPORT   Patient Name:   ELSIE SAKUMA Date of Exam: 11/14/2022 Medical Rec #:  [id number]      Height:       73.0 in Accession #:    [id number]     Weight:  281.5 lb Date of Birth:  September 02, 1981       BSA:          2.488 m Patient Age:    41 years       BP:           129/79 mmHg Patient Gender: M              HR:           75 bpm. Exam Location:  Inpatient Procedure: 2D Echo, Color Doppler, Cardiac Doppler and Intracardiac            Opacification Agent Indications:    Chemo Z09  History:        Patient has no prior history of Echocardiogram examinations.                 Chest Mass; Signs/Symptoms:Shortness of Breath.  Sonographer:    Greer Pickerel Referring Phys: [id number] Brunetta Genera  Sonographer Comments: Image acquisition challenging due to patient body habitus and Image acquisition challenging due to respiratory motion. Global longitudinal strain was attempted. IMPRESSIONS  1. Left ventricular ejection fraction, by estimation, is 60 to 65%. The left ventricle has normal function. The left ventricle has no regional wall motion abnormalities. There is mild left ventricular hypertrophy. Left ventricular diastolic parameters were normal.  2. Right ventricular systolic function is normal. The right ventricular size is normal. Tricuspid regurgitation signal is inadequate for assessing PA pressure.  3. The mitral valve is normal in structure. No evidence of mitral valve regurgitation.  4. The aortic valve is tricuspid. Aortic valve regurgitation is not visualized.  5. The inferior vena cava is normal in size with greater than 50% respiratory variability,  suggesting right atrial pressure of 3 mmHg. Comparison(s): No prior Echocardiogram. FINDINGS  Left Ventricle: Left ventricular ejection fraction, by estimation, is 60 to 65%. The left ventricle has normal function. The left ventricle has no regional wall motion abnormalities. Definity contrast agent was given IV to delineate the left ventricular  endocardial borders. The left ventricular internal cavity size was normal in size. There is mild left ventricular hypertrophy. Left ventricular diastolic parameters were normal. Right Ventricle: The right ventricular size is normal. No increase in right ventricular wall thickness. Right ventricular systolic function is normal. Tricuspid regurgitation signal is inadequate for assessing PA pressure. Left Atrium: Left atrial size was normal in size. Right Atrium: Right atrial size was normal in size. Pericardium: There is no evidence of pericardial effusion. Mitral Valve: The mitral valve is normal in structure. No evidence of mitral valve regurgitation. Tricuspid Valve: The tricuspid valve is normal in structure. Tricuspid valve regurgitation is not demonstrated. Aortic Valve: The aortic valve is tricuspid. Aortic valve regurgitation is not visualized. Pulmonic Valve: Pulmonic valve regurgitation is not visualized. Aorta: The aortic root and ascending aorta are structurally normal, with no evidence of dilitation. Venous: The inferior vena cava is normal in size with greater than 50% respiratory variability, suggesting right atrial pressure of 3 mmHg. IAS/Shunts: No atrial level shunt detected by color flow Doppler.  LEFT VENTRICLE PLAX 2D LVIDd:         3.60 cm   Diastology LVIDs:         2.40 cm   LV e' medial:    6.85 cm/s LV PW:         1.40 cm   LV E/e' medial:  13.3 LV IVS:        0.80 cm   LV e' lateral:  12.70 cm/s LVOT diam:     2.10 cm   LV E/e' lateral: 7.1 LV SV:         77 LV SV Index:   31 LVOT Area:     3.46 cm  RIGHT VENTRICLE RV S prime:     17.70 cm/s TAPSE  (M-mode): 2.8 cm LEFT ATRIUM           Index        RIGHT ATRIUM           Index LA diam:      3.10 cm 1.25 cm/m   RA Area:     16.80 cm LA Vol (A2C): 46.4 ml 18.65 ml/m  RA Volume:   40.80 ml  16.40 ml/m LA Vol (A4C): 36.6 ml 14.71 ml/m  AORTIC VALVE LVOT Vmax:   134.00 cm/s LVOT Vmean:  81.400 cm/s LVOT VTI:    0.223 m  AORTA Ao Root diam: 3.80 cm Ao Asc diam:  3.20 cm MITRAL VALVE MV Area (PHT): 3.63 cm    SHUNTS MV Decel Time: 209 msec    Systemic VTI:  0.22 m MR Peak grad: 3.7 mmHg     Systemic Diam: 2.10 cm MR Vmax:      96.60 cm/s MV E velocity: 90.80 cm/s MV A velocity: 76.70 cm/s MV E/A ratio:  1.18 Landscape architect signed by Phineas Inches Signature Date/Time: 11/14/2022/4:58:35 PM    Final         Scheduled Meds:  allopurinol  100 mg Oral BID   bictegravir-emtricitabine-tenofovir AF  1 tablet Oral Daily   dexamethasone  6 mg Oral BID WC   lidocaine (PF)  5 mL Intradermal Once   polyethylene glycol  17 g Oral Daily   senna-docusate  1 tablet Oral BID   sulfamethoxazole-trimethoprim  1 tablet Oral Daily   traMADol  100 mg Oral Q6H   Continuous Infusions:          Aline August, MD [facility] 11/15/2022, 8:00 AM

## 2022-11-15 NOTE — Progress Notes (Signed)
[facility] Infectious Disease  Date of Admission:  11/10/2022     Total days of antibiotics 3        ASSESSMENT: Logan Ward is a 41 y.o. male here for evaluation and treatment for rapidly growing mass to anterior right chest. CT imaging with submental soft tissue mass in the right neck extending to the upper chest anterior to the clavicle with central necrosis and surrounding lymphadenopathy described to be possibly infectious/inflammatory or malignant. Imaging reviewed - no contact with underlying bone structures, fortunately. He is now s/p core biopsy of the mass with initiation of broad-spectrum antibiotics (Vanco and Unasyn). Biopsy results on 11/30 showed B-cell non-Hodgkin's lymphoma with high-grade features which is consistent with Burkitt's lymphoma. Oncology on board and initiating treatment and further work up. Abx discontinued. Chest mass remain tender, but no respiratory concerns.   Continue biktarvy / bactrim prophy - last VL 3 weeks ago 324 after short med lapse with insurance. CD4 up to 500 with a notable rapid reconstitution from a few months back.  PLAN: Heme/Onc following, appreciate their assistance Pending transfer to [facility] to start chemo Continue decadron and allopurinol Follow-up AFB smear/culture, fungal culture Continue bactrim prophylaxis Continue Biktarvy daily for HIV treatment    Principal Problem:   Chest mass Active Problems:   AIDS (acquired immune deficiency syndrome) (HCC)   History of syphilis   Burkitt lymphoma of lymph nodes of multiple regions [facility])   Counseling regarding advance care planning and goals of care   allopurinol  100 mg Oral BID   bictegravir-emtricitabine-tenofovir AF  1 tablet Oral Daily   dexamethasone  6 mg Oral BID WC   lidocaine (PF)  5 mL Intradermal Once   polyethylene glycol  17 g Oral Daily   senna-docusate  1 tablet Oral BID   sulfamethoxazole-trimethoprim  1 tablet Oral Daily   traMADol  100 mg  Oral Q6H    SUBJECTIVE: Spoke with family about cancer diagnosis yesterday Mass on chest still slight tender.  Bowel regimen did not work yesterday.  Denies any SOB or trouble swallowing  No Known Allergies  OBJECTIVE: Vitals:   11/14/22 1128 11/14/22 1611 11/15/22 0415 11/15/22 0742  BP: 115/83 129/76 108/66 (!) 108/59  Pulse: 93 79  68  Resp: '15 15 18 18  '$ Temp: 98.4 F (36.9 C)  98.1 F (36.7 C) 97.9 F (36.6 C)  TempSrc: Oral  Oral Oral  SpO2: 98% 100% 100% 100%  Weight:      Height:       Body mass index is 37.14 kg/m.  Physician Exam General: Pleasant, well-appearing man laying in bed. No acute distress. HEENT: Moist mucous membrane. No tongue swelling. Chest wall: Large right neck/chest wall mass, warm to touch, mild increase in size.  CV: RRR. No murmurs, rubs, or gallops. No LE edema Pulmonary: Comfortable on room air. Lungs clear to auscultation. No stridor or wheezing.  Abdominal: Soft, nontender, nondistended. Normal bowel sounds. Extremities: Palpable radial and DP pulses. Normal ROM. Skin: Warm and dry. No obvious rash or lesions. Neuro: A&Ox3. Moves all extremities. Normal sensation. No focal deficit. Psych: Normal mood and affect  Lab Results Lab Results  Component Value Date   WBC 4.8 11/10/2022   HGB 13.4 11/10/2022   HCT 42.2 11/10/2022   MCV 86.1 11/10/2022   PLT 185 11/10/2022    Lab Results  Component Value Date   CREATININE 1.06 11/15/2022   BUN 9 11/15/2022   NA  133 (L) 11/15/2022   K 4.8 11/15/2022   CL 101 11/15/2022   CO2 23 11/15/2022    Lab Results  Component Value Date   ALT 14 11/10/2022   AST 31 11/10/2022   ALKPHOS 54 11/10/2022   BILITOT 1.2 11/10/2022     Microbiology: Recent Results (from the past 240 hour(s))  Resp Panel by RT-PCR (Flu A&B, Covid) Anterior Nasal Swab     Status: None   Collection Time: 11/10/22  2:03 PM   Specimen: Anterior Nasal Swab  Result Value Ref Range Status   SARS Coronavirus 2 by RT  PCR NEGATIVE NEGATIVE Final    Comment: (NOTE) SARS-CoV-2 target nucleic acids are NOT DETECTED.  The SARS-CoV-2 RNA is generally detectable in upper respiratory specimens during the acute phase of infection. The lowest concentration of SARS-CoV-2 viral copies this assay can detect is 138 copies/mL. A negative result does not preclude SARS-Cov-2 infection and should not be used as the sole basis for treatment or other patient management decisions. A negative result may occur with  improper specimen collection/handling, submission of specimen other than nasopharyngeal swab, presence of viral mutation(s) within the areas targeted by this assay, and inadequate number of viral copies(<138 copies/mL). A negative result must be combined with clinical observations, patient history, and epidemiological information. The expected result is Negative.  Fact Sheet for Patients:  EntrepreneurPulse.com.au  Fact Sheet for Healthcare Providers:  IncredibleEmployment.be  This test is no t yet approved or cleared by the [place] FDA and  has been authorized for detection and/or diagnosis of SARS-CoV-2 by FDA under an Emergency Use Authorization (EUA). This EUA will remain  in effect (meaning this test can be used) for the duration of the COVID-19 declaration under Section 564(b)(1) of the Act, 21 U.S.C.section 360bbb-3(b)(1), unless the authorization is terminated  or revoked sooner.       Influenza A by PCR NEGATIVE NEGATIVE Final   Influenza B by PCR NEGATIVE NEGATIVE Final    Comment: (NOTE) The Xpert Xpress SARS-CoV-2/FLU/RSV plus assay is intended as an aid in the diagnosis of influenza from Nasopharyngeal swab specimens and should not be used as a sole basis for treatment. Nasal washings and aspirates are unacceptable for Xpert Xpress SARS-CoV-2/FLU/RSV testing.  Fact Sheet for Patients: EntrepreneurPulse.com.au  Fact Sheet for  Healthcare Providers: IncredibleEmployment.be  This test is not yet approved or cleared by the [place] FDA and has been authorized for detection and/or diagnosis of SARS-CoV-2 by FDA under an Emergency Use Authorization (EUA). This EUA will remain in effect (meaning this test can be used) for the duration of the COVID-19 declaration under Section 564(b)(1) of the Act, 21 U.S.C. section 360bbb-3(b)(1), unless the authorization is terminated or revoked.  Performed at [facility], [place] [address], [place], [place] [address]   Aerobic/Anaerobic Culture w Gram Stain (surgical/deep wound)     Status: None (Preliminary result)   Collection Time: 11/12/22 12:16 PM   Specimen: Tissue  Result Value Ref Range Status   Specimen Description TISSUE  Final   Special Requests NONE  Final   Gram Stain NO WBC SEEN NO ORGANISMS SEEN   Final   Culture   Final    NO GROWTH 2 DAYS Performed at [facility], [place] [address], [place], [place] [address]    Report Status PENDING  Incomplete  Acid Fast Smear (AFB)     Status: None   Collection Time: 11/12/22 12:16 PM   Specimen: Soft Tissue Mass  Excision  Result Value Ref Range Status   AFB Specimen Processing Concentration  Final   Acid Fast Smear Negative  Final    Comment: (NOTE) Performed At: [facility] [place], [place] [id number] Rush Farmer MD OI:[id number]    Source (AFB) TISSUE  Final    Comment: Performed at [facility], [place] [address], [place], [place] [address]    Lacinda Axon, MD 11/15/2022, 11:27 AM IM Resident, PGY-3 Oswaldo Milian 41:10

## 2022-11-15 NOTE — Progress Notes (Signed)
Patient  left unit at 1600 with care link to be transferred to [facility]

## 2022-11-16 DIAGNOSIS — R222 Localized swelling, mass and lump, trunk: Secondary | ICD-10-CM | POA: Diagnosis not present

## 2022-11-16 LAB — COMPREHENSIVE METABOLIC PANEL
ALT: 21 U/L (ref 0–44)
AST: 22 U/L (ref 15–41)
Albumin: 4 g/dL (ref 3.5–5.0)
Alkaline Phosphatase: 49 U/L (ref 38–126)
Anion gap: 10 (ref 5–15)
BUN: 16 mg/dL (ref 6–20)
CO2: 24 mmol/L (ref 22–32)
Calcium: 9.6 mg/dL (ref 8.9–10.3)
Chloride: 102 mmol/L (ref 98–111)
Creatinine, Ser: 1.06 mg/dL (ref 0.61–1.24)
GFR, Estimated: >60 mL/min (ref >60)
Glucose, Bld: 123 mg/dL — ABNORMAL HIGH (ref 70–99)
Potassium: 4 mmol/L (ref 3.5–5.1)
Sodium: 136 mmol/L (ref 135–145)
Total Bilirubin: 0.7 mg/dL (ref 0.3–1.2)
Total Protein: 8.3 g/dL — ABNORMAL HIGH (ref 6.5–8.1)

## 2022-11-16 LAB — CBC WITH DIFFERENTIAL/PLATELET
Abs Immature Granulocytes: 0.06 10*3/uL (ref 0.00–0.07)
Basophils Absolute: 0 10*3/uL (ref 0.0–0.1)
Basophils Relative: 0 %
Eosinophils Absolute: 0 10*3/uL (ref 0.0–0.5)
Eosinophils Relative: 0 %
HCT: 42.4 % (ref 39.0–52.0)
Hemoglobin: 13.2 g/dL (ref 13.0–17.0)
Immature Granulocytes: 1 %
Lymphocytes Relative: 29 %
Lymphs Abs: 2.6 10*3/uL (ref 0.7–4.0)
MCH: 26.2 pg (ref 26.0–34.0)
MCHC: 31.1 g/dL (ref 30.0–36.0)
MCV: 84.3 fL (ref 80.0–100.0)
Monocytes Absolute: 0.5 10*3/uL (ref 0.1–1.0)
Monocytes Relative: 5 %
Neutro Abs: 5.8 10*3/uL (ref 1.7–7.7)
Neutrophils Relative %: 65 %
Platelets: 255 10*3/uL (ref 150–400)
RBC: 5.03 MIL/uL (ref 4.22–5.81)
RDW: 13.9 % (ref 11.5–15.5)
WBC: 9 10*3/uL (ref 4.0–10.5)
nRBC: 0 % (ref 0.0–0.2)

## 2022-11-16 LAB — URIC ACID: Uric Acid, Serum: 7.3 mg/dL (ref 3.7–8.6)

## 2022-11-16 NOTE — Progress Notes (Signed)
Spoke with RN re PICC placement tonight.  Pt requests to have PICC placed in the morning.

## 2022-11-16 NOTE — Progress Notes (Incomplete)
ID Brief Note ( Chart reviewed, not seen in person)  Patient transferred from [facility] to [facility] Friday evening in the setting of new diagnosis of Burkitt's lymphoma. Oncology following, plan for BM biopsy, port placement and chemotherapy EPOCH-R q21 D starting Monday.  Afebrile, CMP unremarkable   HIV - On Biktarvy and Bactrim ppx ( improved VL in the last 3 months of Biktarvy noted)  Dr Drue Second back from Monday   Lab Results  Component Value Date   HIV1RNAQUANT 324 (H) 10/21/2022    Lab Results  Component Value Date   CD4TABS 501 10/21/2022   CD4TABS 162 (L) 08/28/2022   CD4TABS 115 (L) 07/23/2022      Latest Ref Rng & Units 11/17/2022    9:28 AM 11/16/2022    7:02 AM 11/10/2022    1:53 PM  CBC  WBC 4.0 - 10.5 K/uL 9.4  9.0  4.8   Hemoglobin 13.0 - 17.0 g/dL 16.1  09.6  04.5   Hematocrit 39.0 - 52.0 % 39.8  42.4  42.2   Platelets 150 - 400 K/uL 251  255  185       Latest Ref Rng & Units 11/17/2022    9:28 AM 11/16/2022    7:02 AM 11/15/2022    8:00 AM  CMP  Glucose 70 - 99 mg/dL 89  409  811   BUN 6 - 20 mg/dL 19  16  9    Creatinine 0.61 - 1.24 mg/dL 9.14  7.82  9.56   Sodium 135 - 145 mmol/L 136  136  133   Potassium 3.5 - 5.1 mmol/L 4.4  4.0  4.8   Chloride 98 - 111 mmol/L 103  102  101   CO2 22 - 32 mmol/L 26  24  23    Calcium 8.9 - 10.3 mg/dL 9.2  9.6  9.8   Total Protein 6.5 - 8.1 g/dL 7.9  8.3    Total Bilirubin 0.3 - 1.2 mg/dL 1.3  0.7    Alkaline Phos 38 - 126 U/L 48  49    AST 15 - 41 U/L 28  22    ALT 0 - 44 U/L 17  21      Results for orders placed or performed during the hospital encounter of 11/10/22  Resp Panel by RT-PCR (Flu A&B, Covid) Anterior Nasal Swab     Status: None   Collection Time: 11/10/22  2:03 PM   Specimen: Anterior Nasal Swab  Result Value Ref Range Status   SARS Coronavirus 2 by RT PCR NEGATIVE NEGATIVE Final    Comment: (NOTE) SARS-CoV-2 target nucleic acids are NOT DETECTED.  The SARS-CoV-2 RNA is generally detectable in upper  respiratory specimens during the acute phase of infection. The lowest concentration of SARS-CoV-2 viral copies this assay can detect is 138 copies/mL. A negative result does not preclude SARS-Cov-2 infection and should not be used as the sole basis for treatment or other patient management decisions. A negative result may occur with  improper specimen collection/handling, submission of specimen other than nasopharyngeal swab, presence of viral mutation(s) within the areas targeted by this assay, and inadequate number of viral copies(<138 copies/mL). A negative result must be combined with clinical observations, patient history, and epidemiological information. The expected result is Negative.  Fact Sheet for Patients:  BloggerCourse.com  Fact Sheet for Healthcare Providers:  SeriousBroker.it  This test is no t yet approved or cleared by the [place] FDA and  has been authorized for detection and/or diagnosis of  SARS-CoV-2 by FDA under an Emergency Use Authorization (EUA). This EUA will remain  in effect (meaning this test can be used) for the duration of the COVID-19 declaration under Section 564(b)(1) of the Act, 21 U.S.C.section 360bbb-3(b)(1), unless the authorization is terminated  or revoked sooner.       Influenza A by PCR NEGATIVE NEGATIVE Final   Influenza B by PCR NEGATIVE NEGATIVE Final    Comment: (NOTE) The Xpert Xpress SARS-CoV-2/FLU/RSV plus assay is intended as an aid in the diagnosis of influenza from Nasopharyngeal swab specimens and should not be used as a sole basis for treatment. Nasal washings and aspirates are unacceptable for Xpert Xpress SARS-CoV-2/FLU/RSV testing.  Fact Sheet for Patients: BloggerCourse.com  Fact Sheet for Healthcare Providers: SeriousBroker.it  This test is not yet approved or cleared by the [place] FDA and has been  authorized for detection and/or diagnosis of SARS-CoV-2 by FDA under an Emergency Use Authorization (EUA). This EUA will remain in effect (meaning this test can be used) for the duration of the COVID-19 declaration under Section 564(b)(1) of the Act, 21 U.S.C. section 360bbb-3(b)(1), unless the authorization is terminated or revoked.  Performed at [facility], [address] [place], [place] [address]   Aerobic/Anaerobic Culture w Gram Stain (surgical/deep wound)     Status: None (Preliminary result)   Collection Time: 11/12/22 12:16 PM   Specimen: Tissue  Result Value Ref Range Status   Specimen Description TISSUE  Final   Special Requests NONE  Final   Gram Stain NO WBC SEEN NO ORGANISMS SEEN   Final   Culture   Final    NO GROWTH 4 DAYS NO ANAEROBES ISOLATED; CULTURE IN PROGRESS FOR 5 DAYS Performed at [facility], [address] [place], [place] [address]    Report Status PENDING  Incomplete  Acid Fast Smear (AFB)     Status: None   Collection Time: 11/12/22 12:16 PM   Specimen: Soft Tissue Mass Excision  Result Value Ref Range Status   AFB Specimen Processing Concentration  Final   Acid Fast Smear Negative  Final    Comment: (NOTE) Performed At: [facility] [address] [place], [place] [id number] Jolene Schimke MD WG:[id number]    Source (AFB) TISSUE  Final    Comment: Performed at [facility], [address] [place], [place] [address]   11/12/22 FINAL MICROSCOPIC DIAGNOSIS:  A. LYMPH NODE, RIGHT NECK, NEEDLE CORE BIOPSY: -  Consistent with a B-cell lymphoma with high-grade features, see note  Note: The needle core biopsies consist predominantly of geographic tumoral type necrosis.  Focally there are sheets of moderate-sized CD20/PAX5 positive lymphoid cells that have a high apoptotic/mitotic index with a "starry sky" appearance.  Additionally, while there is limited material the cells of interest are positive for CD10 and  BCL6 and negative for both Bcl-2 and MUM1.  CD3/CD5 highlights background T cells CD138, CD30, cyclin D1 and EBV ISH are negative.  In the areas of viability the proliferation rate is greater than 90%.  While the evaluation is limited due to the extensive necrosis the overall findings are consistent with a B-cell lymphoma and most consistent with a Burkitt lymphoma.  FISH testing for confirmatory purposes will be attempted; however, given the extensive necrosis there may be insufficient material.  Odette Fraction, MD Infectious Disease Physician [facility] [address] [place], [place] [address] Phone: [phone]  Fax: [phone]

## 2022-11-16 NOTE — Progress Notes (Signed)
PROGRESS NOTE    [name]  JHE:[id number] DOB: Dec 27, 1980 DOA: 11/10/2022 PCP: Merryl Hacker, No     Brief Narrative:  Logan Ward is a 41 y.o. male with medical history of HIV/AIDS and syphilis presented with a rapidly growing mass in the right anterior chest wall.  On presentation, CT scan revealed 8 cm x 5.9 cm x 7.4 cm soft tissue mass with centralized necrosis extending anterior to the thyroid and into the upper chest with surrounding lymphadenopathy.  ID/cardiothoracic surgery/ENT/IR were consulted.  He underwent ultrasound-guided biopsy of the right neck mass by IR on 11/12/2022.  IV antibiotics were subsequently started after the biopsy.  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  Oncology was consulted.  Patient was transferred to [facility] 12/1 and planning to undergo bone marrow biopsy,  port placement, PET and initiate chemotherapy 12/4.  New events last 24 hours / Subjective: Patient without any physical complaints.  Doing well overall.  Asking if he can go off unit to eat with family.  Assessment & Plan:   Principal Problem:   Chest mass Active Problems:   AIDS (acquired immune deficiency syndrome) ([place])   History of syphilis   Burkitt lymphoma of lymph nodes of multiple regions [facility])   Counseling regarding advance care planning and goals of care   New diagnosis Burkitt's lymphoma -Status post ultrasound-guided biopsy of right neck mass by IR 11/28 -Final pathology consistent with B-cell lymphoma -Oncology consulted, planning for bone marrow biopsy, port placement, chemotherapy 12/4 -Prophylactic allopurinol for tumor lysis syndrome -Decadron  HIV/AIDS -Appreciate infectious disease -Continue Biktarvy, Bactrim    DVT prophylaxis:  SCDs Start: 11/10/22 1746  Code Status: Full code Family Communication: No family at bedside Disposition Plan:  Status is: Inpatient Remains inpatient appropriate because: Starting chemotherapy on  Monday   Antimicrobials:  Anti-infectives (From admission, onward)    Start     Dose/Rate Route Frequency Ordered Stop   11/14/22 1015  amoxicillin-clavulanate (AUGMENTIN) 875-125 MG per tablet 1 tablet  Status:  Discontinued        1 tablet Oral Every 12 hours 11/14/22 0929 11/14/22 1541   11/14/22 1015  doxycycline (VIBRA-TABS) tablet 100 mg  Status:  Discontinued        100 mg Oral Every 12 hours 11/14/22 0929 11/14/22 1541   11/14/22 0000  amoxicillin-clavulanate (AUGMENTIN) 875-125 MG tablet  Status:  Discontinued        1 tablet Oral 2 times daily 11/14/22 0923 11/14/22    11/14/22 0000  doxycycline (VIBRAMYCIN) 100 MG capsule  Status:  Discontinued        100 mg Oral 2 times daily 11/14/22 0923 11/14/22    11/13/22 0200  vancomycin (VANCOREADY) IVPB 1500 mg/300 mL  Status:  Discontinued        1,500 mg 150 mL/hr over 120 Minutes Intravenous Every 12 hours 11/12/22 1245 11/14/22 0929   11/12/22 1400  vancomycin (VANCOREADY) IVPB 2000 mg/400 mL        2,000 mg 200 mL/hr over 120 Minutes Intravenous  Once 11/12/22 1245 11/12/22 1738   11/12/22 1330  Ampicillin-Sulbactam (UNASYN) 3 g in sodium chloride 0.9 % 100 mL IVPB  Status:  Discontinued        3 g 200 mL/hr over 30 Minutes Intravenous Every 6 hours 11/12/22 1238 11/14/22 0929   11/10/22 1900  bictegravir-emtricitabine-tenofovir AF (BIKTARVY) 50-200-25 MG per tablet 1 tablet        1 tablet Oral Daily 11/10/22 1744  11/10/22 1745  sulfamethoxazole-trimethoprim (BACTRIM DS) 800-160 MG per tablet 1 tablet        1 tablet Oral Daily 11/10/22 1744          Objective: Vitals:   11/15/22 1548 11/15/22 1630 11/15/22 2153 11/16/22 0539  BP: 136/87 138/74 (!) 125/59 119/61  Pulse: 76 72 70 (!) 59  Resp: _0 Temp: 98.2 F (36.8 C) 99.2 F (37.3 C) 98.4 F (36.9 C) 98.5 F (36.9 C)  TempSrc: Oral Oral Oral Oral  SpO2: 98% 99% 94% 95%  Weight:      Height:       No intake or output data in the 24 hours ending  11/16/22 1229 Filed Weights   11/11/22 0730 11/11/22 1755  Weight: 123.4 kg 127.7 kg    Examination:  General exam: Appears calm and comfortable  Respiratory system: Clear to auscultation. Respiratory effort normal. No respiratory distress. No conversational dyspnea.  Cardiovascular system: S1 & S2 heard, RRR. No murmurs. No pedal edema. Gastrointestinal system: Abdomen is nondistended, soft and nontender. Normal bowel sounds heard. Central nervous system: Alert and oriented. No focal neurological deficits. Speech clear.  Extremities: Symmetric in appearance  Skin: No rashes, lesions or ulcers on exposed skin  Psychiatry: Judgement and insight appear normal. Mood & affect appropriate.   Data Reviewed: I have personally reviewed following labs and imaging studies  CBC: Recent Labs  Lab 11/10/22 1353 11/16/22 0702  WBC 4.8 9.0  NEUTROABS 1.7 5.8  HGB 13.4 13.2  HCT 42.2 42.4  MCV 86.1 84.3  PLT 185 355   Basic Metabolic Panel: Recent Labs  Lab 11/10/22 1353 11/13/22 0628 11/14/22 0817 11/15/22 0800 11/16/22 0702  NA 138 132* 134* 133* 136  K 4.4 4.2 4.3 4.8 4.0  CL 103 99 103 101 102  CO2 22 23 20* 23 24  GLUCOSE 95 99 97 131* 123*  BUN _1 CREATININE 1.11 1.06 1.05 1.06 1.06  CALCIUM 9.1 8.8* 8.9 9.8 9.6   GFR: Estimated Creatinine Clearance: 131 mL/min (by C-G formula based on SCr of 1.06 mg/dL). Liver Function Tests: Recent Labs  Lab 11/10/22 1353 11/16/22 0702  AST 31 22  ALT 14 21  ALKPHOS 54 49  BILITOT 1.2 0.7  PROT 7.3 8.3*  ALBUMIN 3.9 4.0   No results for input(s): "LIPASE", "AMYLASE" in the last 168 hours. No results for input(s): "AMMONIA" in the last 168 hours. Coagulation Profile: No results for input(s): "INR", "PROTIME" in the last 168 hours. Cardiac Enzymes: No results for input(s): "CKTOTAL", "CKMB", "CKMBINDEX", "TROPONINI" in the last 168 hours. BNP (last 3 results) No results for input(s): "PROBNP" in the last 8760  hours. HbA1C: No results for input(s): "HGBA1C" in the last 72 hours. CBG: No results for input(s): "GLUCAP" in the last 168 hours. Lipid Profile: No results for input(s): "CHOL", "HDL", "LDLCALC", "TRIG", "CHOLHDL", "LDLDIRECT" in the last 72 hours. Thyroid Function Tests: No results for input(s): "TSH", "T4TOTAL", "FREET4", "T3FREE", "THYROIDAB" in the last 72 hours. Anemia Panel: No results for input(s): "VITAMINB12", "FOLATE", "FERRITIN", "TIBC", "IRON", "RETICCTPCT" in the last 72 hours. Sepsis Labs: No results for input(s): "PROCALCITON", "LATICACIDVEN" in the last 168 hours.  Recent Results (from the past 240 hour(s))  Resp Panel by RT-PCR (Flu A&B, Covid) Anterior Nasal Swab     Status: None   Collection Time: 11/10/22  2:03 PM   Specimen: Anterior Nasal Swab  Result Value Ref Range Status   SARS  Coronavirus 2 by RT PCR NEGATIVE NEGATIVE Final    Comment: (NOTE) SARS-CoV-2 target nucleic acids are NOT DETECTED.  The SARS-CoV-2 RNA is generally detectable in upper respiratory specimens during the acute phase of infection. The lowest concentration of SARS-CoV-2 viral copies this assay can detect is 138 copies/mL. A negative result does not preclude SARS-Cov-2 infection and should not be used as the sole basis for treatment or other patient management decisions. A negative result may occur with  improper specimen collection/handling, submission of specimen other than nasopharyngeal swab, presence of viral mutation(s) within the areas targeted by this assay, and inadequate number of viral copies(<138 copies/mL). A negative result must be combined with clinical observations, patient history, and epidemiological information. The expected result is Negative.  Fact Sheet for Patients:  EntrepreneurPulse.com.au  Fact Sheet for Healthcare Providers:  IncredibleEmployment.be  This test is no t yet approved or cleared by the [place] FDA  and  has been authorized for detection and/or diagnosis of SARS-CoV-2 by FDA under an Emergency Use Authorization (EUA). This EUA will remain  in effect (meaning this test can be used) for the duration of the COVID-19 declaration under Section 564(b)(1) of the Act, 21 U.S.C.section 360bbb-3(b)(1), unless the authorization is terminated  or revoked sooner.       Influenza A by PCR NEGATIVE NEGATIVE Final   Influenza B by PCR NEGATIVE NEGATIVE Final    Comment: (NOTE) The Xpert Xpress SARS-CoV-2/FLU/RSV plus assay is intended as an aid in the diagnosis of influenza from Nasopharyngeal swab specimens and should not be used as a sole basis for treatment. Nasal washings and aspirates are unacceptable for Xpert Xpress SARS-CoV-2/FLU/RSV testing.  Fact Sheet for Patients: EntrepreneurPulse.com.au  Fact Sheet for Healthcare Providers: IncredibleEmployment.be  This test is not yet approved or cleared by the [place] FDA and has been authorized for detection and/or diagnosis of SARS-CoV-2 by FDA under an Emergency Use Authorization (EUA). This EUA will remain in effect (meaning this test can be used) for the duration of the COVID-19 declaration under Section 564(b)(1) of the Act, 21 U.S.C. section 360bbb-3(b)(1), unless the authorization is terminated or revoked.  Performed at [facility], [place] [address] [place], [place] [address]   Aerobic/Anaerobic Culture w Gram Stain (surgical/deep wound)     Status: None (Preliminary result)   Collection Time: 11/12/22 12:16 PM   Specimen: Tissue  Result Value Ref Range Status   Specimen Description TISSUE  Final   Special Requests NONE  Final   Gram Stain NO WBC SEEN NO ORGANISMS SEEN   Final   Culture   Final    NO GROWTH 4 DAYS NO ANAEROBES ISOLATED; CULTURE IN PROGRESS FOR 5 DAYS Performed at [facility], [place] [address], [place], [place] [address]    Report Status PENDING   Incomplete  Acid Fast Smear (AFB)     Status: None   Collection Time: 11/12/22 12:16 PM   Specimen: Soft Tissue Mass Excision  Result Value Ref Range Status   AFB Specimen Processing Concentration  Final   Acid Fast Smear Negative  Final    Comment: (NOTE) Performed At: [facility] [place], [place] [id number] Rush Farmer MD QI:[id number]    Source (AFB) TISSUE  Final    Comment: Performed at [facility], [place] [address] [place],  [address]      Radiology Studies: [place] EKG SITE RITE  Result Date: 11/15/2022 If [facility] image not attached, placement  could not be confirmed due to current cardiac rhythm.  ECHOCARDIOGRAM COMPLETE  Result Date: 11/14/2022    ECHOCARDIOGRAM REPORT   Patient Name:   Logan Ward Date of Exam: 11/14/2022 Medical Rec #:  [id number]      Height:       73.0 in Accession #:    [id number]     Weight:       281.5 lb Date of Birth:  18-Sep-1981       BSA:          2.488 m Patient Age:    70 years       BP:           129/79 mmHg Patient Gender: M              HR:           75 bpm. Exam Location:  Inpatient Procedure: 2D Echo, Color Doppler, Cardiac Doppler and Intracardiac            Opacification Agent Indications:    Chemo Z09  History:        Patient has no prior history of Echocardiogram examinations.                 Chest Mass; Signs/Symptoms:Shortness of Breath.  Sonographer:    Greer Pickerel Referring Phys: [id number] Brunetta Genera  Sonographer Comments: Image acquisition challenging due to patient body habitus and Image acquisition challenging due to respiratory motion. Global longitudinal strain was attempted. IMPRESSIONS  1. Left ventricular ejection fraction, by estimation, is 60 to 65%. The left ventricle has normal function. The left ventricle has no regional wall motion abnormalities. There is mild left ventricular hypertrophy. Left ventricular diastolic parameters were normal.  2. Right ventricular systolic function is  normal. The right ventricular size is normal. Tricuspid regurgitation signal is inadequate for assessing PA pressure.  3. The mitral valve is normal in structure. No evidence of mitral valve regurgitation.  4. The aortic valve is tricuspid. Aortic valve regurgitation is not visualized.  5. The inferior vena cava is normal in size with greater than 50% respiratory variability, suggesting right atrial pressure of 3 mmHg. Comparison(s): No prior Echocardiogram. FINDINGS  Left Ventricle: Left ventricular ejection fraction, by estimation, is 60 to 65%. The left ventricle has normal function. The left ventricle has no regional wall motion abnormalities. Definity contrast agent was given IV to delineate the left ventricular  endocardial borders. The left ventricular internal cavity size was normal in size. There is mild left ventricular hypertrophy. Left ventricular diastolic parameters were normal. Right Ventricle: The right ventricular size is normal. No increase in right ventricular wall thickness. Right ventricular systolic function is normal. Tricuspid regurgitation signal is inadequate for assessing PA pressure. Left Atrium: Left atrial size was normal in size. Right Atrium: Right atrial size was normal in size. Pericardium: There is no evidence of pericardial effusion. Mitral Valve: The mitral valve is normal in structure. No evidence of mitral valve regurgitation. Tricuspid Valve: The tricuspid valve is normal in structure. Tricuspid valve regurgitation is not demonstrated. Aortic Valve: The aortic valve is tricuspid. Aortic valve regurgitation is not visualized. Pulmonic Valve: Pulmonic valve regurgitation is not visualized. Aorta: The aortic root and ascending aorta are structurally normal, with no evidence of dilitation. Venous: The inferior vena cava is normal in size with greater than 50% respiratory variability, suggesting right atrial pressure of 3 mmHg. IAS/Shunts: No atrial level shunt detected by color  flow Doppler.  LEFT VENTRICLE PLAX  2D LVIDd:         3.60 cm   Diastology LVIDs:         2.40 cm   LV e' medial:    6.85 cm/s LV PW:         1.40 cm   LV E/e' medial:  13.3 LV IVS:        0.80 cm   LV e' lateral:   12.70 cm/s LVOT diam:     2.10 cm   LV E/e' lateral: 7.1 LV SV:         77 LV SV Index:   31 LVOT Area:     3.46 cm  RIGHT VENTRICLE RV S prime:     17.70 cm/s TAPSE (M-mode): 2.8 cm LEFT ATRIUM           Index        RIGHT ATRIUM           Index LA diam:      3.10 cm 1.25 cm/m   RA Area:     16.80 cm LA Vol (A2C): 46.4 ml 18.65 ml/m  RA Volume:   40.80 ml  16.40 ml/m LA Vol (A4C): 36.6 ml 14.71 ml/m  AORTIC VALVE LVOT Vmax:   134.00 cm/s LVOT Vmean:  81.400 cm/s LVOT VTI:    0.223 m  AORTA Ao Root diam: 3.80 cm Ao Asc diam:  3.20 cm MITRAL VALVE MV Area (PHT): 3.63 cm    SHUNTS MV Decel Time: 209 msec    Systemic VTI:  0.22 m MR Peak grad: 3.7 mmHg     Systemic Diam: 2.10 cm MR Vmax:      96.60 cm/s MV E velocity: 90.80 cm/s MV A velocity: 76.70 cm/s MV E/A ratio:  1.18 Landscape architect signed by Phineas Inches Signature Date/Time: 11/14/2022/4:58:35 PM    Final       Scheduled Meds:  allopurinol  100 mg Oral BID   bictegravir-emtricitabine-tenofovir AF  1 tablet Oral Daily   dexamethasone  6 mg Oral BID WC   polyethylene glycol  17 g Oral Daily   senna-docusate  1 tablet Oral BID   sulfamethoxazole-trimethoprim  1 tablet Oral Daily   traMADol  100 mg Oral Q6H   Continuous Infusions:   LOS: 6 days     Dessa Phi, DO [facility] 11/16/2022, 12:29 PM   Available via Epic secure chat 7am-7pm After these hours, please refer to coverage provider listed on [email or web address]

## 2022-11-17 DIAGNOSIS — R222 Localized swelling, mass and lump, trunk: Secondary | ICD-10-CM | POA: Diagnosis not present

## 2022-11-17 LAB — CBC WITH DIFFERENTIAL/PLATELET
Abs Immature Granulocytes: 0.08 10*3/uL — ABNORMAL HIGH (ref 0.00–0.07)
Basophils Absolute: 0 10*3/uL (ref 0.0–0.1)
Basophils Relative: 0 %
Eosinophils Absolute: 0 10*3/uL (ref 0.0–0.5)
Eosinophils Relative: 0 %
HCT: 39.8 % (ref 39.0–52.0)
Hemoglobin: 12.5 g/dL — ABNORMAL LOW (ref 13.0–17.0)
Immature Granulocytes: 1 %
Lymphocytes Relative: 34 %
Lymphs Abs: 3.2 10*3/uL (ref 0.7–4.0)
MCH: 26.7 pg (ref 26.0–34.0)
MCHC: 31.4 g/dL (ref 30.0–36.0)
MCV: 84.9 fL (ref 80.0–100.0)
Monocytes Absolute: 0.7 10*3/uL (ref 0.1–1.0)
Monocytes Relative: 8 %
Neutro Abs: 5.4 10*3/uL (ref 1.7–7.7)
Neutrophils Relative %: 57 %
Platelets: 251 10*3/uL (ref 150–400)
RBC: 4.69 MIL/uL (ref 4.22–5.81)
RDW: 14.1 % (ref 11.5–15.5)
WBC: 9.4 10*3/uL (ref 4.0–10.5)
nRBC: 0.2 % (ref 0.0–0.2)

## 2022-11-17 LAB — COMPREHENSIVE METABOLIC PANEL
ALT: 17 U/L (ref 0–44)
AST: 28 U/L (ref 15–41)
Albumin: 3.9 g/dL (ref 3.5–5.0)
Alkaline Phosphatase: 48 U/L (ref 38–126)
Anion gap: 7 (ref 5–15)
BUN: 19 mg/dL (ref 6–20)
CO2: 26 mmol/L (ref 22–32)
Calcium: 9.2 mg/dL (ref 8.9–10.3)
Chloride: 103 mmol/L (ref 98–111)
Creatinine, Ser: 0.84 mg/dL (ref 0.61–1.24)
GFR, Estimated: >60 mL/min (ref >60)
Glucose, Bld: 89 mg/dL (ref 70–99)
Potassium: 4.4 mmol/L (ref 3.5–5.1)
Sodium: 136 mmol/L (ref 135–145)
Total Bilirubin: 1.3 mg/dL — ABNORMAL HIGH (ref 0.3–1.2)
Total Protein: 7.9 g/dL (ref 6.5–8.1)

## 2022-11-17 LAB — AEROBIC/ANAEROBIC CULTURE W GRAM STAIN (SURGICAL/DEEP WOUND)
Culture: NO GROWTH
Gram Stain: NONE SEEN

## 2022-11-17 LAB — URIC ACID: Uric Acid, Serum: 6.3 mg/dL (ref 3.7–8.6)

## 2022-11-17 MED ORDER — CHLORHEXIDINE GLUCONATE CLOTH 2 % EX PADS
6.0000 | MEDICATED_PAD | Freq: Every day | CUTANEOUS | Status: DC
  Administered 2022-11-17 – 2022-11-22 (×6): 6 via TOPICAL

## 2022-11-17 MED ORDER — SODIUM CHLORIDE 0.9% FLUSH
10.0000 mL | Freq: Two times a day (BID) | INTRAVENOUS | Status: DC
  Administered 2022-11-17 – 2022-11-19 (×5): 10 mL

## 2022-11-17 MED ORDER — SODIUM CHLORIDE 0.9% FLUSH: 10.0000 mL | INTRAVENOUS | Status: DC | PRN

## 2022-11-17 NOTE — Progress Notes (Signed)
Peripherally Inserted Central Catheter Placement  The IV Nurse has discussed with the patient and/or persons authorized to consent for the patient, the purpose of this procedure and the potential benefits and risks involved with this procedure.  The benefits include less needle sticks, lab draws from the catheter, and the patient may be discharged home with the catheter. Risks include, but not limited to, infection, bleeding, blood clot (thrombus formation), and puncture of an artery; nerve damage and irregular heartbeat and possibility to perform a PICC exchange if needed/ordered by physician.  Alternatives to this procedure were also discussed.  Bard Power PICC patient education guide, fact sheet on infection prevention and patient information card has been provided to patient /or left at bedside.    PICC Placement Documentation  PICC Double Lumen 11/17/22 Left Brachial 46 cm 0 cm (Active)  Indication for Insertion or Continuance of Line Prolonged intravenous therapies;Chronic illness with exacerbations (CF, Sickle Cell, etc.) 11/17/22 0859  Exposed Catheter (cm) 0 cm 11/17/22 0859  Site Assessment Clean, Dry, Intact 11/17/22 0859  Lumen #1 Status Flushed;Saline locked;Blood return noted 11/17/22 0859  Lumen #2 Status Flushed;Saline locked;Blood return noted 11/17/22 0859  Dressing Type Transparent;Securing device 11/17/22 0859  Dressing Status Antimicrobial disc in place;Clean, Dry, Intact 11/17/22 0859  Safety Lock Not Applicable 76/72/09 4709  Line Care Connections checked and tightened 11/17/22 0859  Line Adjustment (NICU/IV Team Only) No 11/17/22 0859  Dressing Intervention New dressing 11/17/22 0859  Dressing Change Due 11/24/22 11/17/22 0859       Rolena Infante 11/17/2022, 8:59 AM

## 2022-11-17 NOTE — Progress Notes (Signed)
PROGRESS NOTE    Logan Ward  QAS:[id number] DOB: 1981/05/02 DOA: 11/10/2022 PCP: Merryl Hacker, No     Brief Narrative:  Logan Ward is a 41 y.o. male with medical history of HIV/AIDS and syphilis presented with a rapidly growing mass in the right anterior chest wall.  On presentation, CT scan revealed 8 cm x 5.9 cm x 7.4 cm soft tissue mass with centralized necrosis extending anterior to the thyroid and into the upper chest with surrounding lymphadenopathy.  ID/cardiothoracic surgery/ENT/IR were consulted.  He underwent ultrasound-guided biopsy of the right neck mass by IR on 11/12/2022.  IV antibiotics were subsequently started after the biopsy.  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  Oncology was consulted.  Patient was transferred to [facility] 12/1 and planning to undergo bone marrow biopsy,  port placement, PET and initiate chemotherapy 12/4.  New events last 24 hours / Subjective: PICC line was placed this morning.  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 [facility])   Counseling regarding advance care planning and goals of care   New diagnosis Burkitt's lymphoma -Status post ultrasound-guided biopsy of right neck mass by IR 11/28 -Final pathology consistent with B-cell lymphoma -Oncology consulted, planning for bone marrow biopsy, port placement, chemotherapy 12/4 -Prophylactic allopurinol for tumor lysis syndrome -Decadron  HIV/AIDS -Appreciate infectious disease -Continue Biktarvy, Bactrim    DVT prophylaxis:  SCDs Start: 11/10/22 1746  Code Status: Full code Family Communication: No family at bedside Disposition Plan:  Status is: Inpatient Remains inpatient appropriate because: Starting chemotherapy on Monday   Antimicrobials:  Anti-infectives (From  admission, onward)    Start     Dose/Rate Route Frequency Ordered Stop   11/14/22 1015  amoxicillin-clavulanate (AUGMENTIN) 875-125 MG per tablet 1 tablet  Status:  Discontinued        1 tablet Oral Every 12 hours 11/14/22 0929 11/14/22 1541   11/14/22 1015  doxycycline (VIBRA-TABS) tablet 100 mg  Status:  Discontinued        100 mg Oral Every 12 hours 11/14/22 0929 11/14/22 1541   11/14/22 0000  amoxicillin-clavulanate (AUGMENTIN) 875-125 MG tablet  Status:  Discontinued        1 tablet Oral 2 times daily 11/14/22 0923 11/14/22    11/14/22 0000  doxycycline (VIBRAMYCIN) 100 MG capsule  Status:  Discontinued        100 mg Oral 2 times daily 11/14/22 0923 11/14/22    11/13/22 0200  vancomycin (VANCOREADY) IVPB 1500 mg/300 mL  Status:  Discontinued        1,500 mg 150 mL/hr over 120 Minutes Intravenous Every 12 hours 11/12/22 1245 11/14/22 0929   11/12/22 1400  vancomycin (VANCOREADY) IVPB 2000 mg/400 mL        2,000 mg 200 mL/hr over 120 Minutes Intravenous  Once 11/12/22 1245 11/12/22 1738   11/12/22 1330  Ampicillin-Sulbactam (UNASYN) 3 g in sodium chloride 0.9 % 100 mL IVPB  Status:  Discontinued        3 g 200 mL/hr over 30 Minutes Intravenous Every 6 hours 11/12/22 1238 11/14/22 0929   11/10/22 1900  bictegravir-emtricitabine-tenofovir AF (BIKTARVY) 50-200-25 MG per tablet 1 tablet        1 tablet Oral Daily 11/10/22 1744     11/10/22 1745  sulfamethoxazole-trimethoprim (BACTRIM DS) 800-160 MG  per tablet 1 tablet        1 tablet Oral Daily 11/10/22 1744          Objective: Vitals:   11/16/22 0539 11/16/22 1317 11/16/22 2143 11/17/22 0633  BP: 119/61 136/68 127/74 (!) 115/55  Pulse: (!) 59 69 72 65  Resp: _0 Temp: 98.5 F (36.9 C) 98.4 F (36.9 C) 97.7 F (36.5 C) 98.3 F (36.8 C)  TempSrc: Oral Oral Oral Oral  SpO2: 95% 96% 98% 98%  Weight:      Height:        Intake/Output Summary (Last 24 hours) at 11/17/2022 1016 Last data filed at 11/17/2022 0942 Gross  per 24 hour  Intake 10 ml  Output --  Net 10 ml   Filed Weights   11/11/22 0730 11/11/22 1755  Weight: 123.4 kg 127.7 kg    Examination:  General exam: Appears calm and comfortable without distress  Data Reviewed: I have personally reviewed following labs and imaging studies  CBC: Recent Labs  Lab 11/10/22 1353 11/16/22 0702 11/17/22 0928  WBC 4.8 9.0 9.4  NEUTROABS 1.7 5.8 5.4  HGB 13.4 13.2 12.5*  HCT 42.2 42.4 39.8  MCV 86.1 84.3 84.9  PLT 185 255 578    Basic Metabolic Panel: Recent Labs  Lab 11/13/22 0628 11/14/22 0817 11/15/22 0800 11/16/22 0702 11/17/22 0928  NA 132* 134* 133* 136 136  K 4.2 4.3 4.8 4.0 4.4  CL 99 103 101 102 103  CO2 23 20* _1 GLUCOSE 99 97 131* 123* 89  BUN _2 CREATININE 1.06 1.05 1.06 1.06 0.84  CALCIUM 8.8* 8.9 9.8 9.6 9.2    GFR: Estimated Creatinine Clearance: 165.3 mL/min (by C-G formula based on SCr of 0.84 mg/dL). Liver Function Tests: Recent Labs  Lab 11/10/22 1353 11/16/22 0702 11/17/22 0928  AST _3 ALT _4 ALKPHOS 54 49 48  BILITOT 1.2 0.7 1.3*  PROT 7.3 8.3* 7.9  ALBUMIN 3.9 4.0 3.9    No results for input(s): "LIPASE", "AMYLASE" in the last 168 hours. No results for input(s): "AMMONIA" in the last 168 hours. Coagulation Profile: No results for input(s): "INR", "PROTIME" in the last 168 hours. Cardiac Enzymes: No results for input(s): "CKTOTAL", "CKMB", "CKMBINDEX", "TROPONINI" in the last 168 hours. BNP (last 3 results) No results for input(s): "PROBNP" in the last 8760 hours. HbA1C: No results for input(s): "HGBA1C" in the last 72 hours. CBG: No results for input(s): "GLUCAP" in the last 168 hours. Lipid Profile: No results for input(s): "CHOL", "HDL", "LDLCALC", "TRIG", "CHOLHDL", "LDLDIRECT" in the last 72 hours. Thyroid Function Tests: No results for input(s): "TSH", "T4TOTAL", "FREET4", "T3FREE", "THYROIDAB" in the last 72 hours. Anemia Panel: No results for  input(s): "VITAMINB12", "FOLATE", "FERRITIN", "TIBC", "IRON", "RETICCTPCT" in the last 72 hours. Sepsis Labs: No results for input(s): "PROCALCITON", "LATICACIDVEN" in the last 168 hours.  Recent Results (from the past 240 hour(s))  Resp Panel by RT-PCR (Flu A&B, Covid) Anterior Nasal Swab     Status: None   Collection Time: 11/10/22  2:03 PM   Specimen: Anterior Nasal Swab  Result Value Ref Range Status   SARS Coronavirus 2 by RT PCR NEGATIVE NEGATIVE Final    Comment: (NOTE) SARS-CoV-2 target nucleic acids are NOT DETECTED.  The SARS-CoV-2 RNA is generally detectable in upper respiratory specimens during the acute phase of infection. The lowest concentration of SARS-CoV-2 viral copies this assay  can detect is 138 copies/mL. A negative result does not preclude SARS-Cov-2 infection and should not be used as the sole basis for treatment or other patient management decisions. A negative result may occur with  improper specimen collection/handling, submission of specimen other than nasopharyngeal swab, presence of viral mutation(s) within the areas targeted by this assay, and inadequate number of viral copies(<138 copies/mL). A negative result must be combined with clinical observations, patient history, and epidemiological information. The expected result is Negative.  Fact Sheet for Patients:  EntrepreneurPulse.com.au  Fact Sheet for Healthcare Providers:  IncredibleEmployment.be  This test is no t yet approved or cleared by the [place] FDA and  has been authorized for detection and/or diagnosis of SARS-CoV-2 by FDA under an Emergency Use Authorization (EUA). This EUA will remain  in effect (meaning this test can be used) for the duration of the COVID-19 declaration under Section 564(b)(1) of the Act, 21 U.S.C.section 360bbb-3(b)(1), unless the authorization is terminated  or revoked sooner.       Influenza A by PCR NEGATIVE NEGATIVE  Final   Influenza B by PCR NEGATIVE NEGATIVE Final    Comment: (NOTE) The Xpert Xpress SARS-CoV-2/FLU/RSV plus assay is intended as an aid in the diagnosis of influenza from Nasopharyngeal swab specimens and should not be used as a sole basis for treatment. Nasal washings and aspirates are unacceptable for Xpert Xpress SARS-CoV-2/FLU/RSV testing.  Fact Sheet for Patients: EntrepreneurPulse.com.au  Fact Sheet for Healthcare Providers: IncredibleEmployment.be  This test is not yet approved or cleared by the [place] FDA and has been authorized for detection and/or diagnosis of SARS-CoV-2 by FDA under an Emergency Use Authorization (EUA). This EUA will remain in effect (meaning this test can be used) for the duration of the COVID-19 declaration under Section 564(b)(1) of the Act, 21 U.S.C. section 360bbb-3(b)(1), unless the authorization is terminated or revoked.  Performed at [facility], [place] [address] [place], [place] [address]   Aerobic/Anaerobic Culture w Gram Stain (surgical/deep wound)     Status: None (Preliminary result)   Collection Time: 11/12/22 12:16 PM   Specimen: Tissue  Result Value Ref Range Status   Specimen Description TISSUE  Final   Special Requests NONE  Final   Gram Stain NO WBC SEEN NO ORGANISMS SEEN   Final   Culture   Final    NO GROWTH 4 DAYS NO ANAEROBES ISOLATED; CULTURE IN PROGRESS FOR 5 DAYS Performed at [facility], [place] [address], [place], [place] [address]    Report Status PENDING  Incomplete  Acid Fast Smear (AFB)     Status: None   Collection Time: 11/12/22 12:16 PM   Specimen: Soft Tissue Mass Excision  Result Value Ref Range Status   AFB Specimen Processing Concentration  Final   Acid Fast Smear Negative  Final    Comment: (NOTE) Performed At: [facility] [place], [place] [id number] Rush Farmer MD [id number]    Source (AFB) TISSUE  Final     Comment: Performed at [facility], [place] [address], [place], [place] [address]      Radiology Studies: [place] EKG SITE RITE  Result Date: 11/15/2022 If Site Rite image not attached, placement could not be confirmed due to current cardiac rhythm.     Scheduled Meds:  allopurinol  100 mg Oral BID   bictegravir-emtricitabine-tenofovir AF  1 tablet Oral Daily   Chlorhexidine Gluconate Cloth  6 each Topical Daily   dexamethasone  6  mg Oral BID WC   polyethylene glycol  17 g Oral Daily   senna-docusate  1 tablet Oral BID   sodium chloride flush  10-40 mL Intracatheter Q12H   sulfamethoxazole-trimethoprim  1 tablet Oral Daily   traMADol  100 mg Oral Q6H   Continuous Infusions:   LOS: 7 days     Dessa Phi, DO [facility] 11/17/2022, 10:16 AM   Available via Epic secure chat 7am-7pm After these hours, please refer to coverage provider listed on [email or web address]

## 2022-11-18 ENCOUNTER — Inpatient Hospital Stay (HOSPITAL_COMMUNITY): Payer: 59

## 2022-11-18 ENCOUNTER — Other Ambulatory Visit (HOSPITAL_COMMUNITY): Payer: Self-pay

## 2022-11-18 ENCOUNTER — Ambulatory Visit (HOSPITAL_COMMUNITY): Payer: 59

## 2022-11-18 DIAGNOSIS — R222 Localized swelling, mass and lump, trunk: Secondary | ICD-10-CM | POA: Diagnosis not present

## 2022-11-18 DIAGNOSIS — Z5111 Encounter for antineoplastic chemotherapy: Secondary | ICD-10-CM

## 2022-11-18 DIAGNOSIS — Z7189 Other specified counseling: Secondary | ICD-10-CM

## 2022-11-18 DIAGNOSIS — C8378 Burkitt lymphoma, lymph nodes of multiple sites: Secondary | ICD-10-CM | POA: Diagnosis not present

## 2022-11-18 HISTORY — PX: IR IMAGING GUIDED PORT INSERTION: IMG5740

## 2022-11-18 LAB — COMPREHENSIVE METABOLIC PANEL
ALT: 14 U/L (ref 0–44)
AST: 27 U/L (ref 15–41)
Albumin: 3.5 g/dL (ref 3.5–5.0)
Alkaline Phosphatase: 43 U/L (ref 38–126)
Anion gap: 7 (ref 5–15)
BUN: 13 mg/dL (ref 6–20)
CO2: 24 mmol/L (ref 22–32)
Calcium: 9.1 mg/dL (ref 8.9–10.3)
Chloride: 102 mmol/L (ref 98–111)
Creatinine, Ser: 1.04 mg/dL (ref 0.61–1.24)
GFR, Estimated: >60 mL/min (ref >60)
Glucose, Bld: 109 mg/dL — ABNORMAL HIGH (ref 70–99)
Potassium: 4.6 mmol/L (ref 3.5–5.1)
Sodium: 133 mmol/L — ABNORMAL LOW (ref 135–145)
Total Bilirubin: 1.2 mg/dL (ref 0.3–1.2)
Total Protein: 7 g/dL (ref 6.5–8.1)

## 2022-11-18 LAB — CBC WITH DIFFERENTIAL/PLATELET
Abs Immature Granulocytes: 0.13 10*3/uL — ABNORMAL HIGH (ref 0.00–0.07)
Basophils Absolute: 0 10*3/uL (ref 0.0–0.1)
Basophils Relative: 0 %
Eosinophils Absolute: 0 10*3/uL (ref 0.0–0.5)
Eosinophils Relative: 0 %
HCT: 40.6 % (ref 39.0–52.0)
Hemoglobin: 12.7 g/dL — ABNORMAL LOW (ref 13.0–17.0)
Immature Granulocytes: 2 %
Lymphocytes Relative: 32 %
Lymphs Abs: 2.6 10*3/uL (ref 0.7–4.0)
MCH: 26.7 pg (ref 26.0–34.0)
MCHC: 31.3 g/dL (ref 30.0–36.0)
MCV: 85.3 fL (ref 80.0–100.0)
Monocytes Absolute: 0.6 10*3/uL (ref 0.1–1.0)
Monocytes Relative: 8 %
Neutro Abs: 4.9 10*3/uL (ref 1.7–7.7)
Neutrophils Relative %: 58 %
Platelets: 266 10*3/uL (ref 150–400)
RBC: 4.76 MIL/uL (ref 4.22–5.81)
RDW: 14.3 % (ref 11.5–15.5)
WBC: 8.3 10*3/uL (ref 4.0–10.5)
nRBC: 0.6 % — ABNORMAL HIGH (ref 0.0–0.2)

## 2022-11-18 LAB — URIC ACID: Uric Acid, Serum: 5.3 mg/dL (ref 3.7–8.6)

## 2022-11-18 MED ORDER — FENTANYL CITRATE (PF) 100 MCG/2ML IJ SOLN
INTRAMUSCULAR | Status: AC | PRN
  Administered 2022-11-18: 25 ug via INTRAVENOUS
  Administered 2022-11-18: 50 ug via INTRAVENOUS
  Administered 2022-11-18: 25 ug via INTRAVENOUS

## 2022-11-18 MED ORDER — VINCRISTINE SULFATE CHEMO INJECTION 1 MG/ML
Freq: Once | INTRAVENOUS | Status: AC
  Filled 2022-11-18: qty 13

## 2022-11-18 MED ORDER — MIDAZOLAM HCL 2 MG/2ML IJ SOLN
INTRAMUSCULAR | Status: AC | PRN
  Administered 2022-11-18: .5 mg via INTRAVENOUS
  Administered 2022-11-18: 1 mg via INTRAVENOUS
  Administered 2022-11-18: .5 mg via INTRAVENOUS

## 2022-11-18 MED ORDER — SODIUM CHLORIDE 0.9 % IV SOLN
Freq: Once | INTRAVENOUS | Status: AC
  Administered 2022-11-18: 8 mg via INTRAVENOUS
  Filled 2022-11-18: qty 4

## 2022-11-18 MED ORDER — LIDOCAINE-EPINEPHRINE 1 %-1:100000 IJ SOLN
INTRAMUSCULAR | Status: AC
  Administered 2022-11-18: 20 mL via INTRADERMAL
  Filled 2022-11-18: qty 1

## 2022-11-18 MED ORDER — LIDOCAINE-PRILOCAINE 2.5-2.5 % EX CREA
TOPICAL_CREAM | CUTANEOUS | 3 refills | Status: DC
  Filled 2022-11-18: qty 30, 30d supply, fill #0
  Filled 2022-11-18: qty 30, fill #0

## 2022-11-18 MED ORDER — HEPARIN SOD (PORK) LOCK FLUSH 100 UNIT/ML IV SOLN
INTRAVENOUS | Status: AC
  Administered 2022-11-18: 5 [IU]
  Filled 2022-11-18: qty 5

## 2022-11-18 MED ORDER — MIDAZOLAM HCL 2 MG/2ML IJ SOLN
INTRAMUSCULAR | Status: AC | PRN
  Administered 2022-11-18 (×2): 1 mg via INTRAVENOUS

## 2022-11-18 MED ORDER — MIDAZOLAM HCL 2 MG/2ML IJ SOLN
INTRAMUSCULAR | Status: AC
  Filled 2022-11-18: qty 4

## 2022-11-18 MED ORDER — FENTANYL CITRATE (PF) 100 MCG/2ML IJ SOLN
INTRAMUSCULAR | Status: AC | PRN
  Administered 2022-11-18 (×2): 50 ug via INTRAVENOUS

## 2022-11-18 MED ORDER — LIDOCAINE HCL (PF) 1 % IJ SOLN
INTRAMUSCULAR | Status: AC | PRN
  Administered 2022-11-18: 10 mL

## 2022-11-18 MED ORDER — SODIUM BICARBONATE/SODIUM CHLORIDE MOUTHWASH
Freq: Four times a day (QID) | OROMUCOSAL | Status: DC
  Administered 2022-11-20: 1 via OROMUCOSAL
  Filled 2022-11-18: qty 1000

## 2022-11-18 MED ORDER — FENTANYL CITRATE (PF) 100 MCG/2ML IJ SOLN
INTRAMUSCULAR | Status: AC
  Filled 2022-11-18: qty 4

## 2022-11-18 NOTE — Procedures (Signed)
Interventional Radiology Procedure Note  Procedure: Single Lumen Power Port Placement    Access:  Left internal jugular vein  Findings: Catheter tip positioned at cavoatrial junction. Port is ready for immediate use.   Complications: None  EBL: < 10 mL  Recommendations:  - Ok to shower in 24 hours - Do not submerge for 7 days - Routine line care    Tarron Krolak, MD   

## 2022-11-18 NOTE — Procedures (Signed)
Interventional Radiology Procedure Note  Procedure: CT guided aspirate and core biopsy of right iliac bone  Complications: None  Recommendations: - Bedrest supine x 1 hrs - Hydrocodone PRN  Pain - Follow biopsy results   Kennadee Walthour, MD   

## 2022-11-18 NOTE — Progress Notes (Signed)
PROGRESS NOTE    Logan Ward  YJE:[id number] DOB: 09-Nov-1981 DOA: 11/10/2022 PCP: Merryl Hacker, No     Brief Narrative:  Logan Ward is a 41 y.o. male with medical history of HIV/AIDS and syphilis presented with a rapidly growing mass in the right anterior chest wall.  On presentation, CT scan revealed 8 cm x 5.9 cm x 7.4 cm soft tissue mass with centralized necrosis extending anterior to the thyroid and into the upper chest with surrounding lymphadenopathy.  ID/cardiothoracic surgery/ENT/IR were consulted.  He underwent ultrasound-guided biopsy of the right neck mass by IR on 11/12/2022.  IV antibiotics were subsequently started after the biopsy.  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  Oncology was consulted.  Patient was transferred to [name] 12/1 and planning to undergo bone marrow biopsy,  port placement, PET and initiate chemotherapy 12/4. PICC line placed 12/3  New events last 24 hours / Subjective: Patient to undergo bone marrow biopsy, port placement and to start chemotherapy today.  He voices no new physical complaints or concerns.  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 [facility])   Counseling regarding advance care planning and goals of care   New diagnosis Burkitt's lymphoma -Status post ultrasound-guided biopsy of right neck mass by IR 11/28 -Final pathology consistent with B-cell lymphoma -Oncology consulted, planning for bone marrow biopsy, port placement, chemotherapy 12/4 -Prophylactic allopurinol for tumor lysis syndrome -Decadron  HIV/AIDS -Appreciate infectious disease -Continue Biktarvy, Bactrim    DVT prophylaxis:  SCDs Start: 11/10/22 1746  Code Status: Full code Family Communication: No family at bedside Disposition Plan:  Status is: Inpatient Remains inpatient appropriate  because: Starting chemotherapy today   Antimicrobials:  Anti-infectives (From admission, onward)    Start     Dose/Rate Route Frequency Ordered Stop   11/14/22 1015  amoxicillin-clavulanate (AUGMENTIN) 875-125 MG per tablet 1 tablet  Status:  Discontinued        1 tablet Oral Every 12 hours 11/14/22 0929 11/14/22 1541   11/14/22 1015  doxycycline (VIBRA-TABS) tablet 100 mg  Status:  Discontinued        100 mg Oral Every 12 hours 11/14/22 0929 11/14/22 1541   11/14/22 0000  amoxicillin-clavulanate (AUGMENTIN) 875-125 MG tablet  Status:  Discontinued        1 tablet Oral 2 times daily 11/14/22 0923 11/14/22    11/14/22 0000  doxycycline (VIBRAMYCIN) 100 MG capsule  Status:  Discontinued        100 mg Oral 2 times daily 11/14/22 0923 11/14/22    11/13/22 0200  vancomycin (VANCOREADY) IVPB 1500 mg/300 mL  Status:  Discontinued        1,500 mg 150 mL/hr over 120 Minutes Intravenous Every 12 hours 11/12/22 1245 11/14/22 0929   11/12/22 1400  vancomycin (VANCOREADY) IVPB 2000 mg/400 mL        2,000 mg 200 mL/hr over 120 Minutes Intravenous  Once 11/12/22 1245 11/12/22 1738   11/12/22 1330  Ampicillin-Sulbactam (UNASYN) 3 g in sodium chloride 0.9 % 100 mL IVPB  Status:  Discontinued        3 g 200 mL/hr over 30 Minutes Intravenous Every 6 hours 11/12/22 1238 11/14/22 0929   11/10/22 1900  bictegravir-emtricitabine-tenofovir AF (BIKTARVY) 50-200-25 MG per tablet 1 tablet        1 tablet Oral Daily 11/10/22  1744     11/10/22 1745  sulfamethoxazole-trimethoprim (BACTRIM DS) 800-160 MG per tablet 1 tablet        1 tablet Oral Daily 11/10/22 1744          Objective: Vitals:   11/18/22 1115 11/18/22 1120 11/18/22 1125 11/18/22 1130  BP: 139/83 (!) 150/82 (!) 152/81 (!) 146/81  Pulse: 70 74 76 84  Resp: (!) _0 (!) 23  Temp:      TempSrc:      SpO2: 99% 98% 97% 98%  Weight:      Height:        Intake/Output Summary (Last 24 hours) at 11/18/2022 1225 Last data filed at 11/17/2022  2100 Gross per 24 hour  Intake 240 ml  Output --  Net 240 ml    Filed Weights   11/11/22 0730 11/11/22 1755  Weight: 123.4 kg 127.7 kg   Examination: General exam: Appears calm and comfortable  Respiratory system: Clear to auscultation. Respiratory effort normal. Cardiovascular system: S1 & S2 heard, RRR. No pedal edema. Gastrointestinal system: Abdomen is nondistended, soft and nontender. Normal bowel sounds heard. Central nervous system: Alert and oriented. Non focal exam. Speech clear  Extremities: Symmetric in appearance bilaterally  Skin: No rashes, lesions or ulcers on exposed skin , right chest wall mass present  Psychiatry: Judgement and insight appear stable. Mood & affect appropriate.    Data Reviewed: I have personally reviewed following labs and imaging studies  CBC: Recent Labs  Lab 11/16/22 0702 11/17/22 0928 11/18/22 0500  WBC 9.0 9.4 8.3  NEUTROABS 5.8 5.4 4.9  HGB 13.2 12.5* 12.7*  HCT 42.4 39.8 40.6  MCV 84.3 84.9 85.3  PLT 255 251 188    Basic Metabolic Panel: Recent Labs  Lab 11/14/22 0817 11/15/22 0800 11/16/22 0702 11/17/22 0928 11/18/22 0500  NA 134* 133* 136 136 133*  K 4.3 4.8 4.0 4.4 4.6  CL 103 101 102 103 102  CO2 20* _1 GLUCOSE 97 131* 123* 89 109*  BUN _2 CREATININE 1.05 1.06 1.06 0.84 1.04  CALCIUM 8.9 9.8 9.6 9.2 9.1    GFR: Estimated Creatinine Clearance: 133.5 mL/min (by C-G formula based on SCr of 1.04 mg/dL). Liver Function Tests: Recent Labs  Lab 11/16/22 0702 11/17/22 0928 11/18/22 0500  AST _3 ALT _4 ALKPHOS 49 48 43  BILITOT 0.7 1.3* 1.2  PROT 8.3* 7.9 7.0  ALBUMIN 4.0 3.9 3.5    No results for input(s): "LIPASE", "AMYLASE" in the last 168 hours. No results for input(s): "AMMONIA" in the last 168 hours. Coagulation Profile: No results for input(s): "INR", "PROTIME" in the last 168 hours. Cardiac Enzymes: No results for input(s): "CKTOTAL", "CKMB", "CKMBINDEX",  "TROPONINI" in the last 168 hours. BNP (last 3 results) No results for input(s): "PROBNP" in the last 8760 hours. HbA1C: No results for input(s): "HGBA1C" in the last 72 hours. CBG: No results for input(s): "GLUCAP" in the last 168 hours. Lipid Profile: No results for input(s): "CHOL", "HDL", "LDLCALC", "TRIG", "CHOLHDL", "LDLDIRECT" in the last 72 hours. Thyroid Function Tests: No results for input(s): "TSH", "T4TOTAL", "FREET4", "T3FREE", "THYROIDAB" in the last 72 hours. Anemia Panel: No results for input(s): "VITAMINB12", "FOLATE", "FERRITIN", "TIBC", "IRON", "RETICCTPCT" in the last 72 hours. Sepsis Labs: No results for input(s): "PROCALCITON", "LATICACIDVEN" in the last 168 hours.  Recent Results (from the past 240 hour(s))  Resp Panel by RT-PCR (Flu A&B, Covid)  Anterior Nasal Swab     Status: None   Collection Time: 11/10/22  2:03 PM   Specimen: Anterior Nasal Swab  Result Value Ref Range Status   SARS Coronavirus 2 by RT PCR NEGATIVE NEGATIVE Final    Comment: (NOTE) SARS-CoV-2 target nucleic acids are NOT DETECTED.  The SARS-CoV-2 RNA is generally detectable in upper respiratory specimens during the acute phase of infection. The lowest concentration of SARS-CoV-2 viral copies this assay can detect is 138 copies/mL. A negative result does not preclude SARS-Cov-2 infection and should not be used as the sole basis for treatment or other patient management decisions. A negative result may occur with  improper specimen collection/handling, submission of specimen other than nasopharyngeal swab, presence of viral mutation(s) within the areas targeted by this assay, and inadequate number of viral copies(<138 copies/mL). A negative result must be combined with clinical observations, patient history, and epidemiological information. The expected result is Negative.  Fact Sheet for Patients:  EntrepreneurPulse.com.au  Fact Sheet for Healthcare Providers:   IncredibleEmployment.be  This test is no t yet approved or cleared by the [place] FDA and  has been authorized for detection and/or diagnosis of SARS-CoV-2 by FDA under an Emergency Use Authorization (EUA). This EUA will remain  in effect (meaning this test can be used) for the duration of the COVID-19 declaration under Section 564(b)(1) of the Act, 21 U.S.C.section 360bbb-3(b)(1), unless the authorization is terminated  or revoked sooner.       Influenza A by PCR NEGATIVE NEGATIVE Final   Influenza B by PCR NEGATIVE NEGATIVE Final    Comment: (NOTE) The Xpert Xpress SARS-CoV-2/FLU/RSV plus assay is intended as an aid in the diagnosis of influenza from Nasopharyngeal swab specimens and should not be used as a sole basis for treatment. Nasal washings and aspirates are unacceptable for Xpert Xpress SARS-CoV-2/FLU/RSV testing.  Fact Sheet for Patients: EntrepreneurPulse.com.au  Fact Sheet for Healthcare Providers: IncredibleEmployment.be  This test is not yet approved or cleared by the [place] FDA and has been authorized for detection and/or diagnosis of SARS-CoV-2 by FDA under an Emergency Use Authorization (EUA). This EUA will remain in effect (meaning this test can be used) for the duration of the COVID-19 declaration under Section 564(b)(1) of the Act, 21 U.S.C. section 360bbb-3(b)(1), unless the authorization is terminated or revoked.  Performed at [facility], [place] [address], [place], [place] [address]   Aerobic/Anaerobic Culture w Gram Stain (surgical/deep wound)     Status: None   Collection Time: 11/12/22 12:16 PM   Specimen: Tissue  Result Value Ref Range Status   Specimen Description TISSUE  Final   Special Requests NONE  Final   Gram Stain NO WBC SEEN NO ORGANISMS SEEN   Final   Culture   Final    No growth aerobically or anaerobically. Performed at [facility], [place] [address], [place], [place] [address]    Report Status 11/17/2022 FINAL  Final  Acid Fast Smear (AFB)     Status: None   Collection Time: 11/12/22 12:16 PM   Specimen: Soft Tissue Mass Excision  Result Value Ref Range Status   AFB Specimen Processing Concentration  Final   Acid Fast Smear Negative  Final    Comment: (NOTE) Performed At: [facility] [place], [place] [id number] Rush Farmer MD XB:[id number]    Source (AFB) TISSUE  Final    Comment: Performed at [facility], [place] [address] [place], [place]  [address]      Radiology Studies: IR IMAGING GUIDED PORT INSERTION  Result Date: 11/18/2022 INDICATION: 41 year old male with history of Burkitt lymphoma requiring central venous access for chemotherapy administration. EXAM: IMPLANTED PORT A CATH PLACEMENT WITH ULTRASOUND AND FLUOROSCOPIC GUIDANCE COMPARISON:  None Available. MEDICATIONS: None. ANESTHESIA/SEDATION: Moderate (conscious) sedation was employed during this procedure. A total of Versed 2 mg and Fentanyl 100 mcg was administered intravenously. Moderate Sedation Time: 22 minutes. The patient's level of consciousness and vital signs were monitored continuously by radiology nursing throughout the procedure under my direct supervision. CONTRAST:  None FLUOROSCOPY TIME:  Three mGy COMPLICATIONS: None immediate. PROCEDURE: The procedure, risks, benefits, and alternatives were explained to the patient. Questions regarding the procedure were encouraged and answered. The patient understands and consents to the procedure. The left neck and chest were prepped with chlorhexidine in a sterile fashion, and a sterile drape was applied covering the operative field. Maximum barrier sterile technique with sterile gowns and gloves were used for the procedure. A timeout was performed prior to the initiation of the procedure. Ultrasound was used to examine the jugular vein which was compressible and free of internal echoes. A skin  marker was used to demarcate the planned venotomy and port pocket incision sites. Local anesthesia was provided to these sites and the subcutaneous tunnel track with 1% lidocaine with 1:100,000 epinephrine. A small incision was created at the jugular access site and blunt dissection was performed of the subcutaneous tissues. Under ultrasound guidance, the jugular vein was accessed with a 21 ga micropuncture needle and an 0.018" wire was inserted to the superior vena cava. Real-time ultrasound guidance was utilized for vascular access including the acquisition of a permanent ultrasound image documenting patency of the accessed vessel. A 5 Fr micopuncture set was then used, through which a 0.035" Rosen wire was passed under fluoroscopic guidance into the inferior vena cava. An 8 Fr dilator was then placed over the wire. A subcutaneous port pocket was then created along the upper chest wall utilizing a combination of sharp and blunt dissection. The pocket was irrigated with sterile saline, packed with gauze, and observed for hemorrhage. A single lumen "ISP" sized power injectable port was chosen for placement. The 8 Fr catheter was tunneled from the port pocket site to the venotomy incision. The port was placed in the pocket. The external catheter was trimmed to appropriate length. The dilator was exchanged for an 8 Fr peel-away sheath under fluoroscopic guidance. The catheter was then placed through the sheath and the sheath was removed. Final catheter positioning was confirmed and documented with a fluoroscopic spot radiograph. The port was accessed with a Huber needle, aspirated, and flushed with heparinized saline. The deep dermal layer of the port pocket incision was closed with interrupted 3-0 Vicryl suture. The skin was opposed with a running subcuticular 4-0 Monocryl suture. Dermabond was then placed over the port pocket and neck incisions. The patient tolerated the procedure well without immediate post  procedural complication. FINDINGS: After catheter placement, the tip lies within the superior cavoatrial junction. The catheter aspirates and flushes normally and is ready for immediate use. IMPRESSION: Successful placement of a power injectable Port-A-Cath via the left internal jugular vein. The catheter is ready for immediate use. Ruthann Cancer, MD [facility] Radiology Electronically Signed   By: Ruthann Cancer M.D.   On: 11/18/2022 12:01   CT BONE MARROW BIOPSY & ASPIRATION  Result Date: 11/18/2022 INDICATION: 41 year old male with history of Burkitt  lymphoma. EXAM: CT-GUIDED BONE MARROW BIOPSY AND ASPIRATION MEDICATIONS: None ANESTHESIA/SEDATION: Fentanyl 100 mcg IV; Versed 2 mg IV Sedation Time: 16 minutes; The patient was continuously monitored during the procedure by the interventional radiology nurse under my direct supervision. COMPLICATIONS: None immediate. PROCEDURE: Informed consent was obtained from the patient following an explanation of the procedure, risks, benefits and alternatives. The patient understands, agrees and consents for the procedure. All questions were addressed. A time out was performed prior to the initiation of the procedure. The patient was positioned prone and non-contrast localization CT was performed of the pelvis to demonstrate the iliac marrow spaces. The operative site was prepped and draped in the usual sterile fashion. Under sterile conditions and local anesthesia, a 22 gauge spinal needle was utilized for procedural planning. Next, an 11 gauge coaxial bone biopsy needle was advanced into the right iliac marrow space. Needle position was confirmed with CT imaging. Initially, a bone marrow aspiration was performed. Next, a bone marrow biopsy was obtained with the 11 gauge outer bone marrow device. Samples were prepared with the cytotechnologist and deemed adequate. The needle was removed and superficial hemostasis was  obtained with manual compression. A dressing was applied. The patient tolerated the procedure well without immediate post procedural complication. IMPRESSION: Successful CT guided right iliac bone marrow aspiration and core biopsy. Ruthann Cancer, MD [facility] Radiology Electronically Signed   By: Ruthann Cancer M.D.   On: 11/18/2022 11:34      Scheduled Meds:  allopurinol  100 mg Oral BID   bictegravir-emtricitabine-tenofovir AF  1 tablet Oral Daily   Chlorhexidine Gluconate Cloth  6 each Topical Daily   dexamethasone  6 mg Oral BID WC   DOXOrubicin (ADRIAMYCIN) 26 mg, etoposide (VEPESID) 128 mg, vinCRIStine (ONCOVIN) 1 mg in sodium chloride 0.9 % 1,000 mL chemo infusion   Intravenous Once   fentaNYL       midazolam       polyethylene glycol  17 g Oral Daily   senna-docusate  1 tablet Oral BID   sodium chloride flush  10-40 mL Intracatheter Q12H   sulfamethoxazole-trimethoprim  1 tablet Oral Daily   traMADol  100 mg Oral Q6H   Continuous Infusions:  ondansetron (ZOFRAN) 8 mg, dexamethasone (DECADRON) 10 mg in sodium chloride 0.9 % 50 mL IVPB 8 mg (11/18/22 1208)     LOS: 8 days     Dessa Phi, DO [facility] 11/18/2022, 12:25 PM   Available via Epic secure chat 7am-7pm After these hours, please refer to coverage provider listed on [email or web address]

## 2022-11-18 NOTE — Sedation Documentation (Signed)
Sample obtained 

## 2022-11-18 NOTE — Progress Notes (Addendum)
Chemotherapy Day 1:  Adriamycin '26mg'$  Etoposide 128 mg Vincristine '1mg'$   Administering via Port infusing over 22 hours.  Blood return positive.  Will continue to monitor.  Pt laying in bed talking on the phone. Consent signed.  Chemotherapy Precautions in place and educated to patient.

## 2022-11-18 NOTE — Progress Notes (Signed)
Per Dr. Irene Limbo, holding PO Prednisone in setting of HIV & pt getting daily Dexamethasone.  Kennith Center, Pharm.D., CPP 11/18/2022'@10'$ :29 AM

## 2022-11-18 NOTE — Progress Notes (Signed)
PICC line removed per order.  Pressure held to achieve hemostasis.  Vaseline/gauze/tegaderm applied and aftercare instructions reviewed with pt who verbalized understanding.  No complications.

## 2022-11-19 ENCOUNTER — Other Ambulatory Visit (HOSPITAL_COMMUNITY): Payer: Self-pay

## 2022-11-19 ENCOUNTER — Inpatient Hospital Stay (HOSPITAL_COMMUNITY): Payer: 59

## 2022-11-19 DIAGNOSIS — K5903 Drug induced constipation: Secondary | ICD-10-CM

## 2022-11-19 DIAGNOSIS — C8378 Burkitt lymphoma, lymph nodes of multiple sites: Secondary | ICD-10-CM | POA: Diagnosis not present

## 2022-11-19 DIAGNOSIS — Z5111 Encounter for antineoplastic chemotherapy: Secondary | ICD-10-CM | POA: Diagnosis not present

## 2022-11-19 DIAGNOSIS — R222 Localized swelling, mass and lump, trunk: Secondary | ICD-10-CM | POA: Diagnosis not present

## 2022-11-19 LAB — CBC WITH DIFFERENTIAL/PLATELET
Abs Immature Granulocytes: 0.1 10*3/uL — ABNORMAL HIGH (ref 0.00–0.07)
Basophils Absolute: 0 10*3/uL (ref 0.0–0.1)
Basophils Relative: 0 %
Eosinophils Absolute: 0 10*3/uL (ref 0.0–0.5)
Eosinophils Relative: 0 %
HCT: 39.7 % (ref 39.0–52.0)
Hemoglobin: 12.7 g/dL — ABNORMAL LOW (ref 13.0–17.0)
Immature Granulocytes: 1 %
Lymphocytes Relative: 28 %
Lymphs Abs: 2.1 10*3/uL (ref 0.7–4.0)
MCH: 27 pg (ref 26.0–34.0)
MCHC: 32 g/dL (ref 30.0–36.0)
MCV: 84.3 fL (ref 80.0–100.0)
Monocytes Absolute: 0.6 10*3/uL (ref 0.1–1.0)
Monocytes Relative: 8 %
Neutro Abs: 4.8 10*3/uL (ref 1.7–7.7)
Neutrophils Relative %: 63 %
Platelets: 244 10*3/uL (ref 150–400)
RBC: 4.71 MIL/uL (ref 4.22–5.81)
RDW: 14.3 % (ref 11.5–15.5)
WBC: 7.6 10*3/uL (ref 4.0–10.5)
nRBC: 0.8 % — ABNORMAL HIGH (ref 0.0–0.2)

## 2022-11-19 LAB — COMPREHENSIVE METABOLIC PANEL
ALT: 14 U/L (ref 0–44)
AST: 20 U/L (ref 15–41)
Albumin: 3.4 g/dL — ABNORMAL LOW (ref 3.5–5.0)
Alkaline Phosphatase: 45 U/L (ref 38–126)
Anion gap: 7 (ref 5–15)
BUN: 17 mg/dL (ref 6–20)
CO2: 24 mmol/L (ref 22–32)
Calcium: 8.9 mg/dL (ref 8.9–10.3)
Chloride: 101 mmol/L (ref 98–111)
Creatinine, Ser: 0.93 mg/dL (ref 0.61–1.24)
GFR, Estimated: >60 mL/min (ref >60)
Glucose, Bld: 109 mg/dL — ABNORMAL HIGH (ref 70–99)
Potassium: 4.4 mmol/L (ref 3.5–5.1)
Sodium: 132 mmol/L — ABNORMAL LOW (ref 135–145)
Total Bilirubin: 1 mg/dL (ref 0.3–1.2)
Total Protein: 7.3 g/dL (ref 6.5–8.1)

## 2022-11-19 LAB — URIC ACID: Uric Acid, Serum: 4.9 mg/dL (ref 3.7–8.6)

## 2022-11-19 MED ORDER — TRAMADOL HCL 50 MG PO TABS
100.0000 mg | ORAL_TABLET | Freq: Four times a day (QID) | ORAL | Status: DC | PRN
  Administered 2022-11-19: 100 mg via ORAL
  Filled 2022-11-19 (×2): qty 2

## 2022-11-19 MED ORDER — SODIUM CHLORIDE 0.9 % IV SOLN
Freq: Once | INTRAVENOUS | Status: AC
  Administered 2022-11-19: 8 mg via INTRAVENOUS
  Filled 2022-11-19: qty 4

## 2022-11-19 MED ORDER — VINCRISTINE SULFATE CHEMO INJECTION 1 MG/ML
Freq: Once | INTRAVENOUS | Status: AC
  Filled 2022-11-19: qty 13

## 2022-11-19 MED ORDER — FLUDEOXYGLUCOSE F - 18 (FDG) INJECTION
14.3200 | Freq: Once | INTRAVENOUS | Status: AC | PRN
  Administered 2022-11-19: 14.32 via INTRAVENOUS

## 2022-11-19 MED ORDER — GADOBUTROL 1 MMOL/ML IV SOLN
10.0000 mL | Freq: Once | INTRAVENOUS | Status: AC | PRN
  Administered 2022-11-19: 10 mL via INTRAVENOUS

## 2022-11-19 NOTE — Progress Notes (Signed)
Initial Nutrition Assessment  DOCUMENTATION CODES:   Not applicable  INTERVENTION:  - Regular diet. - Ensure Enlive po BID, each supplement provides 350 kcal and 20 grams of protein. - Encourage intake of protein rich food sources at all meals.  - Monitor weight trends.   - Patient is due for a new weight, none taken since admission. New weight ordered.   NUTRITION DIAGNOSIS:   Increased nutrient needs related to cancer and cancer related treatments, catabolic illness as evidenced by estimated needs.  GOAL:   Patient will meet greater than or equal to 90% of their needs  MONITOR:   PO intake, Supplement acceptance, Weight trends  REASON FOR ASSESSMENT:   Malnutrition Screening Tool    ASSESSMENT:   41 y.o. male with medical history of HIV/AIDS and syphilis presented with a rapidly growing mass in the right anterior chest wall. Underwent ultrasound-guided biopsy of the right neck mass 11/12/2022. 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    Patient was transferred to [facility] 12/1.   Underwent bone marrow biopsy,  port placement, PET and initiation of chemotherapy on 12/4.   Met with patient at bedside this AM. He reports a UBW of of 268# and denied recent changes in weight. Admits his weight tends to fluctuate as he is very active. Per EMR, patient weighed at 292# in September and now weighed at 272#, which could indicate a 20# or 6.8% weight loss in 2 months, significant. Patient reports this is likely accurate.  He reports his intake of daily meals depends on the day. When he works he will get something for breakfast as he works early in the morning but if he is off will only eat lunch and dinner. Endorses great appetite at baseline.  Current appetite remains good and patient is noted toi be consuming 75-100% of meals the past 3 days. However, patient notes he would eat even better if he wasn't NPO so often.  Discussed increased calorie and protein  requirements with new diagnosis and importance of trying to eat 3 meals a day, focusing on protein rich food sources. Patient agreeable to try nutrition supplements to support intake, only likes strawberry.   Medications reviewed and include: Decadron, Miralax, Senokot, Zofran  Labs reviewed:  Na 132   NUTRITION - FOCUSED PHYSICAL EXAM:  Flowsheet Row Most Recent Value  Orbital Region No depletion  Upper Arm Region No depletion  Thoracic and Lumbar Region No depletion  Buccal Region No depletion  Temple Region Mild depletion  Clavicle Bone Region No depletion  Clavicle and Acromion Bone Region No depletion  Scapular Bone Region Unable to assess  Dorsal Hand No depletion  Patellar Region No depletion  Anterior Thigh Region No depletion  Posterior Calf Region No depletion  Edema (RD Assessment) None  Hair Reviewed  Eyes Reviewed  Mouth Reviewed  Skin Reviewed  Nails Reviewed       Diet Order:   Diet Order             Diet regular Room service appropriate? Yes; Fluid consistency: Thin  Diet effective now                   EDUCATION NEEDS:  Education needs have been addressed  Skin:  Skin Assessment: Reviewed RN Assessment  Last BM:  12/3  Height:  Ht Readings from Last 1 Encounters:  11/11/22 _0  (1.854 m)   Weight:  Wt Readings from Last 1 Encounters:  11/11/22 123.4 kg  Ideal Body Weight:  83.6 kg  BMI:  Body mass index is 35.9 kg/m.  Estimated Nutritional Needs:  Kcal:  1829-9371 kcals Protein:  150-170 grams Fluid:  </= 2.6L   Samson Frederic RD, LDN For contact information, refer to [facility].

## 2022-11-19 NOTE — Progress Notes (Signed)
HEMATOLOGY/ONCOLOGY INPATIENT PROGRESS NOTE  Date of Service: 11/19/2022  Inpatient Attending: .Dessa Phi, DO   SUBJECTIVE  Patient was seen in follow-up for medical oncology.  Receiving cycle 1 day 2 of dose adjusted EPOCH-R treatment for his newly diagnosed Burkitt's lymphoma. Denies any acute issues overnight.  No nausea no vomiting.  Notes some grade 1 constipation. Had his bone marrow aspiration and biopsy on 11/28/2022 without any acute issues. he will be having his PET CT scan and MRI brain later today No tingling or numbness in the hands or feet.  Notes that the right chest wall mass is shrinking progressively and the right neck lesions have already shrunk significantly.   OBJECTIVE:  NAD  PHYSICAL EXAMINATION: . Vitals:   11/18/22 1130 11/18/22 1326 11/18/22 2122 11/19/22 0450  BP: (!) 146/81 (!) 141/81 127/66 (!) 115/54  Pulse: 84 78 72 64  Resp: (!) _0 Temp:  97.7 F (36.5 C) 98 F (36.7 C) 98 F (36.7 C)  TempSrc:  Oral Oral Oral  SpO2: 98% 97% 97% 98%  Weight:      Height:       Filed Weights   11/11/22 0730 11/11/22 1755  Weight: 123.4 kg 127.7 kg   .Body mass index is 37.14 kg/m. [name] GENERAL:alert, in no acute distress and comfortable SKIN: no acute rashes, no significant lesions EYES: conjunctiva are pink and non-injected, sclera anicteric OROPHARYNX: MMM, no exudates, no oropharyngeal erythema or ulceration NECK: supple, no JVD LYMPH: Palpable lymphadenopathy bilateral neck more on the right neck and right upper chest wall LUNGS: clear to auscultation b/l with normal respiratory effort HEART: regular rate & rhythm ABDOMEN:  normoactive bowel sounds , non tender, not distended. Extremity: no pedal edema PSYCH: alert & oriented x 3 with fluent speech NEURO: no focal motor/sensory deficits   MEDICAL HISTORY:  Past Medical History:  Diagnosis Date   Anal fissure    HIV infection ([place])    Internal hemorrhoids    Obesity     Rectal ulcer     SURGICAL HISTORY: Past Surgical History:  Procedure Laterality Date   IR IMAGING GUIDED PORT INSERTION  11/18/2022    SOCIAL HISTORY: Social History   Socioeconomic History   Marital status: Single    Spouse name: Not on file   Number of children: Not on file   Years of education: Not on file   Highest education level: Not on file  Occupational History   Not on file  Tobacco Use   Smoking status: Never   Smokeless tobacco: Never  Vaping Use   Vaping Use: Never used  Substance and Sexual Activity   Alcohol use: No   Drug use: No   Sexual activity: Yes    Comment: given condoms  Other Topics Concern   Not on file  Social History Narrative   Not on file   Social Determinants of Health   Financial Resource Strain: Low Risk  (10/24/2022)   Overall Financial Resource Strain (CARDIA)    Difficulty of Paying Living Expenses: Not hard at all  Food Insecurity: No Food Insecurity (11/11/2022)   Hunger Vital Sign    Worried About Running Out of Food in the Last Year: Never true    Ran Out of Food in the Last Year: Never true  Transportation Needs: No Transportation Needs (11/11/2022)   PRAPARE - Hydrologist (Medical): No    Lack of Transportation (Non-Medical): No  Physical Activity: Sufficiently  Active (10/24/2022)   Exercise Vital Sign    Days of Exercise per Week: 3 days    Minutes of Exercise per Session: 60 min  Recent Concern: Physical Activity - Insufficiently Active (10/24/2022)   Exercise Vital Sign    Days of Exercise per Week: 3 days    Minutes of Exercise per Session: 20 min  Stress: No Stress Concern Present (10/24/2022)   [place]    Feeling of Stress : Not at all  Social Connections: Unknown (10/24/2022)   Social Connection and Isolation Panel [NHANES]    Frequency of Communication with Friends and Family: Twice a week    Frequency of Social  Gatherings with Friends and Family: Twice a week    Attends Religious Services: 1 to 4 times per year    Active Member of [employer] or Organizations: No    Attends Archivist Meetings: 1 to 4 times per year    Marital Status: Patient refused  Intimate Partner Violence: Not At Risk (11/11/2022)   Humiliation, Afraid, Rape, and Kick questionnaire    Fear of Current or Ex-Partner: No    Emotionally Abused: No    Physically Abused: No    Sexually Abused: No    FAMILY HISTORY: Family History  Problem Relation Age of Onset   Hypertension Father    Throat cancer Father    Heart attack Brother    Colon cancer Neg Hx    Esophageal cancer Neg Hx    Stomach cancer Neg Hx     ALLERGIES:  has No Known Allergies.  MEDICATIONS:  Scheduled Meds:  allopurinol  100 mg Oral BID   bictegravir-emtricitabine-tenofovir AF  1 tablet Oral Daily   Chlorhexidine Gluconate Cloth  6 each Topical Daily   dexamethasone  6 mg Oral BID WC   DOXOrubicin (ADRIAMYCIN) 26 mg, etoposide (VEPESID) 128 mg, vinCRIStine (ONCOVIN) 1 mg in sodium chloride 0.9 % 1,000 mL chemo infusion   Intravenous Once   polyethylene glycol  17 g Oral Daily   senna-docusate  1 tablet Oral BID   sodium bicarbonate/sodium chloride   Mouth Rinse QID   sodium chloride flush  10-40 mL Intracatheter Q12H   sulfamethoxazole-trimethoprim  1 tablet Oral Daily   Continuous Infusions:  ondansetron (ZOFRAN) 8 mg, dexamethasone (DECADRON) 10 mg in sodium chloride 0.9 % 50 mL IVPB     PRN Meds:.acetaminophen **OR** acetaminophen, bisacodyl, iohexol, magnesium hydroxide, ondansetron **OR** ondansetron (ZOFRAN) IV, sodium chloride flush, sodium phosphate, traMADol  REVIEW OF SYSTEMS:    10 Point review of Systems was done is negative except as noted above.   LABORATORY DATA:  I have reviewed the data as listed  .    Latest Ref Rng & Units 11/19/2022    5:45 AM 11/18/2022    5:00 AM 11/17/2022    9:28 AM  CBC  WBC 4.0 - 10.5 K/uL  7.6  8.3  9.4   Hemoglobin 13.0 - 17.0 g/dL 12.7  12.7  12.5   Hematocrit 39.0 - 52.0 % 39.7  40.6  39.8   Platelets 150 - 400 K/uL 244  266  251     .    Latest Ref Rng & Units 11/19/2022    5:45 AM 11/18/2022    5:00 AM 11/17/2022    9:28 AM  CMP  Glucose 70 - 99 mg/dL 109  109  89   BUN 6 - 20 mg/dL 17  13  19  Creatinine 0.61 - 1.24 mg/dL 0.93  1.04  0.84   Sodium 135 - 145 mmol/L 132  133  136   Potassium 3.5 - 5.1 mmol/L 4.4  4.6  4.4   Chloride 98 - 111 mmol/L 101  102  103   CO2 22 - 32 mmol/L _0 Calcium 8.9 - 10.3 mg/dL 8.9  9.1  9.2   Total Protein 6.5 - 8.1 g/dL 7.3  7.0  7.9   Total Bilirubin 0.3 - 1.2 mg/dL 1.0  1.2  1.3   Alkaline Phos 38 - 126 U/L 45  43  48   AST 15 - 41 U/L _1 ALT 0 - 44 U/L _2 RADIOGRAPHIC STUDIES: I have personally reviewed the radiological images as listed and agreed with the findings in the report. IR IMAGING GUIDED PORT INSERTION  Result Date: 11/18/2022 INDICATION: 41 year old male with history of Burkitt lymphoma requiring central venous access for chemotherapy administration. EXAM: IMPLANTED PORT A CATH PLACEMENT WITH ULTRASOUND AND FLUOROSCOPIC GUIDANCE COMPARISON:  None Available. MEDICATIONS: None. ANESTHESIA/SEDATION: Moderate (conscious) sedation was employed during this procedure. A total of Versed 2 mg and Fentanyl 100 mcg was administered intravenously. Moderate Sedation Time: 22 minutes. The patient's level of consciousness and vital signs were monitored continuously by radiology nursing throughout the procedure under my direct supervision. CONTRAST:  None FLUOROSCOPY TIME:  Three mGy COMPLICATIONS: None immediate. PROCEDURE: The procedure, risks, benefits, and alternatives were explained to the patient. Questions regarding the procedure were encouraged and answered. The patient understands and consents to the procedure. The left neck and chest were prepped with chlorhexidine in a sterile fashion, and a  sterile drape was applied covering the operative field. Maximum barrier sterile technique with sterile gowns and gloves were used for the procedure. A timeout was performed prior to the initiation of the procedure. Ultrasound was used to examine the jugular vein which was compressible and free of internal echoes. A skin marker was used to demarcate the planned venotomy and port pocket incision sites. Local anesthesia was provided to these sites and the subcutaneous tunnel track with 1% lidocaine with 1:100,000 epinephrine. A small incision was created at the jugular access site and blunt dissection was performed of the subcutaneous tissues. Under ultrasound guidance, the jugular vein was accessed with a 21 ga micropuncture needle and an 0.018" wire was inserted to the superior vena cava. Real-time ultrasound guidance was utilized for vascular access including the acquisition of a permanent ultrasound image documenting patency of the accessed vessel. A 5 Fr micopuncture set was then used, through which a 0.035" Rosen wire was passed under fluoroscopic guidance into the inferior vena cava. An 8 Fr dilator was then placed over the wire. A subcutaneous port pocket was then created along the upper chest wall utilizing a combination of sharp and blunt dissection. The pocket was irrigated with sterile saline, packed with gauze, and observed for hemorrhage. A single lumen "ISP" sized power injectable port was chosen for placement. The 8 Fr catheter was tunneled from the port pocket site to the venotomy incision. The port was placed in the pocket. The external catheter was trimmed to appropriate length. The dilator was exchanged for an 8 Fr peel-away sheath under fluoroscopic guidance. The catheter was then placed through the sheath and the sheath was removed. Final catheter positioning was confirmed and documented with a fluoroscopic spot radiograph. The port was  accessed with a Huber needle, aspirated, and flushed with  heparinized saline. The deep dermal layer of the port pocket incision was closed with interrupted 3-0 Vicryl suture. The skin was opposed with a running subcuticular 4-0 Monocryl suture. Dermabond was then placed over the port pocket and neck incisions. The patient tolerated the procedure well without immediate post procedural complication. FINDINGS: After catheter placement, the tip lies within the superior cavoatrial junction. The catheter aspirates and flushes normally and is ready for immediate use. IMPRESSION: Successful placement of a power injectable Port-A-Cath via the left internal jugular vein. The catheter is ready for immediate use. Ruthann Cancer, MD [facility] Radiology Electronically Signed   By: Ruthann Cancer M.D.   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 EXAM: CT-GUIDED BONE MARROW BIOPSY AND ASPIRATION MEDICATIONS: None ANESTHESIA/SEDATION: Fentanyl 100 mcg IV; Versed 2 mg IV Sedation Time: 16 minutes; The patient was continuously monitored during the procedure by the interventional radiology nurse under my direct supervision. COMPLICATIONS: None immediate. PROCEDURE: Informed consent was obtained from the patient following an explanation of the procedure, risks, benefits and alternatives. The patient understands, agrees and consents for the procedure. All questions were addressed. A time out was performed prior to the initiation of the procedure. The patient was positioned prone and non-contrast localization CT was performed of the pelvis to demonstrate the iliac marrow spaces. The operative site was prepped and draped in the usual sterile fashion. Under sterile conditions and local anesthesia, a 22 gauge spinal needle was utilized for procedural planning. Next, an 11 gauge coaxial bone biopsy needle was advanced into the right iliac marrow  space. Needle position was confirmed with CT imaging. Initially, a bone marrow aspiration was performed. Next, a bone marrow biopsy was obtained with the 11 gauge outer bone marrow device. Samples were prepared with the cytotechnologist and deemed adequate. The needle was removed and superficial hemostasis was obtained with manual compression. A dressing was applied. The patient tolerated the procedure well without immediate post procedural complication. IMPRESSION: Successful CT guided right iliac bone marrow aspiration and core biopsy. Ruthann Cancer, MD [facility] Radiology Electronically Signed   By: Ruthann Cancer M.D.   On: 11/18/2022 11:34   [place] EKG SITE RITE  Result Date: 11/15/2022 If Site Rite image not attached, placement could not be confirmed due to current cardiac rhythm.  ECHOCARDIOGRAM COMPLETE  Result Date: 11/14/2022    ECHOCARDIOGRAM REPORT   Patient Name:   Logan Ward Date of Exam: 11/14/2022 Medical Rec #:  [id number]      Height:       73.0 in Accession #:    [id number]     Weight:       281.5 lb Date of Birth:  [birth year]       BSA:          2.488 m Patient Age:    41 years       BP:           129/79 mmHg Patient Gender: M              HR:           75 bpm. Exam Location:  Inpatient Procedure: 2D Echo, Color Doppler, Cardiac Doppler and Intracardiac            Opacification Agent Indications:    Chemo Z09  History:        Patient has no prior history of Echocardiogram examinations.                 Chest Mass; Signs/Symptoms:Shortness of Breath.  Sonographer:    Greer Pickerel Referring Phys: [id number] Brunetta Genera  Sonographer Comments: Image acquisition challenging due to patient body habitus and Image acquisition challenging due to respiratory motion. Global longitudinal strain was attempted. IMPRESSIONS  1. Left ventricular ejection fraction, by estimation, is 60 to 65%. The left ventricle has normal function. The left ventricle  has no regional wall motion abnormalities. There is mild left ventricular hypertrophy. Left ventricular diastolic parameters were normal.  2. Right ventricular systolic function is normal. The right ventricular size is normal. Tricuspid regurgitation signal is inadequate for assessing PA pressure.  3. The mitral valve is normal in structure. No evidence of mitral valve regurgitation.  4. The aortic valve is tricuspid. Aortic valve regurgitation is not visualized.  5. The inferior vena cava is normal in size with greater than 50% respiratory variability, suggesting right atrial pressure of 3 mmHg. Comparison(s): No prior Echocardiogram. FINDINGS  Left Ventricle: Left ventricular ejection fraction, by estimation, is 60 to 65%. The left ventricle has normal function. The left ventricle has no regional wall motion abnormalities. Definity contrast agent was given IV to delineate the left ventricular  endocardial borders. The left ventricular internal cavity size was normal in size. There is mild left ventricular hypertrophy. Left ventricular diastolic parameters were normal. Right Ventricle: The right ventricular size is normal. No increase in right ventricular wall thickness. Right ventricular systolic function is normal. Tricuspid regurgitation signal is inadequate for assessing PA pressure. Left Atrium: Left atrial size was normal in size. Right Atrium: Right atrial size was normal in size. Pericardium: There is no evidence of pericardial effusion. Mitral Valve: The mitral valve is normal in structure. No evidence of mitral valve regurgitation. Tricuspid Valve: The tricuspid valve is normal in structure. Tricuspid valve regurgitation is not demonstrated. Aortic Valve: The aortic valve is tricuspid. Aortic valve regurgitation is not visualized. Pulmonic Valve: Pulmonic valve regurgitation is not visualized. Aorta: The aortic root and ascending aorta are structurally normal, with no evidence of dilitation. Venous: The  inferior vena cava is normal in size with greater than 50% respiratory variability, suggesting right atrial pressure of 3 mmHg. IAS/Shunts: No atrial level shunt detected by color flow Doppler.  LEFT VENTRICLE PLAX 2D LVIDd:         3.60 cm   Diastology LVIDs:         2.40 cm   LV e' medial:    6.85 cm/s LV PW:         1.40 cm   LV E/e' medial:  13.3 LV IVS:        0.80 cm   LV e' lateral:   12.70 cm/s LVOT diam:     2.10 cm   LV E/e' lateral: 7.1 LV SV:         77 LV SV Index:   31 LVOT Area:     3.46 cm  RIGHT VENTRICLE RV S prime:     17.70 cm/s TAPSE (M-mode): 2.8 cm LEFT ATRIUM           Index        RIGHT ATRIUM           Index LA diam:      3.10 cm 1.25 cm/m   RA Area:     16.80  cm LA Vol (A2C): 46.4 ml 18.65 ml/m  RA Volume:   40.80 ml  16.40 ml/m LA Vol (A4C): 36.6 ml 14.71 ml/m  AORTIC VALVE LVOT Vmax:   134.00 cm/s LVOT Vmean:  81.400 cm/s LVOT VTI:    0.223 m  AORTA Ao Root diam: 3.80 cm Ao Asc diam:  3.20 cm MITRAL VALVE MV Area (PHT): 3.63 cm    SHUNTS MV Decel Time: 209 msec    Systemic VTI:  0.22 m MR Peak grad: 3.7 mmHg     Systemic Diam: 2.10 cm MR Vmax:      96.60 cm/s MV E velocity: 90.80 cm/s MV A velocity: 76.70 cm/s MV E/A ratio:  1.18 Landscape architect signed by Phineas Inches Signature Date/Time: 11/14/2022/4:58:35 PM    Final    [place] CORE BIOPSY (SOFT TISSUE)  Result Date: 11/12/2022 INDICATION: 41 year old gentleman with history of lymphoma presents to IR for biopsy of right neck mass EXAM: Ultrasound-guided biopsy of right neck mass MEDICATIONS: None. ANESTHESIA/SEDATION: Moderate (conscious) sedation was employed during this procedure. A total of Versed 2.5 mg and Fentanyl 75 mcg was administered intravenously. Moderate Sedation Time: 10 minutes. The patient's level of consciousness and vital signs were monitored continuously by radiology nursing throughout the procedure under my direct supervision. COMPLICATIONS: None immediate. PROCEDURE: Informed written consent was  obtained from the patient after a thorough discussion of the procedural risks, benefits and alternatives. All questions were addressed. Maximal Sterile Barrier Technique was utilized including caps, mask, sterile gowns, sterile gloves, sterile drape, hand hygiene and skin antiseptic. A timeout was performed prior to the initiation of the procedure. Patient position supine on the ultrasound table. Right neck skin prepped and draped in usual sterile fashion. Following local lidocaine administration, 5- 18 gauge cores were obtained from the right neck mass utilizing continuous ultrasound guidance. Two cores were sent for Gram stain and culture in sterile saline. Three cores were sent for histologic analysis in formalin. Needle removed and hemostasis achieved with 5 minutes of manual compression. Post procedure ultrasound images showed no evidence of significant hemorrhage. IMPRESSION: Ultrasound-guided biopsy of right neck mass. Electronically Signed   By: Miachel Roux M.D.   On: 11/12/2022 17:17   CT Soft Tissue Neck W Contrast  Result Date: 11/10/2022 CLINICAL DATA:  Neck mass with history of HIV. EXAM: CT NECK WITH CONTRAST TECHNIQUE: Multidetector CT imaging of the neck was performed using the standard protocol following the bolus administration of intravenous contrast. RADIATION DOSE REDUCTION: This exam was performed according to the departmental dose-optimization program which includes automated exposure control, adjustment of the mA and/or kV according to patient size and/or use of iterative reconstruction technique. CONTRAST:  122m OMNIPAQUE IOHEXOL 350 MG/ML SOLN COMPARISON:  CT neck 6 days ago. FINDINGS: Pharynx and larynx: The nasal cavity and nasopharynx are unremarkable. The oral cavity and oropharynx are unremarkable. The parapharyngeal spaces are clear. The hypopharynx and larynx are unremarkable. The vocal folds are normal in appearance. There is no retropharyngeal fluid collection.  The airway  is patent. Salivary glands: The parotid and submandibular glands are unremarkable. Thyroid: Unremarkable. Lymph nodes: There is enlarged centrally necrotic right level I lymph node measuring up to 2.1 cm in short axis, similar to the CT from 6 days ago. The large infiltrative centrally necrotic soft tissue mass throughout the right neck measuring up to approximately 8.1 cm TV x 5.9 cm AP by 7.4 cm cc is also not significantly changed in size. Inferior extent into the upper chest  anterior to the thyroid with centrally necrotic appearing tissue measuring 4.9 cm x 5.4 cm is unchanged when measured again using similar technique. Overlying skin thickening is similar. The mass is inseparable from the sternocleidomastoid muscle. There are numerous additional prominent right-sided cervical chain lymph nodes measuring up to 1.0 cm at level IIA (3-38), and 1.1 cm in short axis in the posterior triangle (3-43). Vascular: The major vasculature of the neck is unremarkable. Limited intracranial: The imaged portions of the posterior fossa are unremarkable. Visualized orbits: Not included within the field of view. Mastoids and visualized paranasal sinuses: Clear. Skeleton: There is no acute osseous abnormality or suspicious osseous lesion. Upper chest: Assessed on the separately dictated CT chest. Other: None. IMPRESSION: No significant interval change since the CT neck from 6 days prior. Unchanged infiltrative soft tissue mass in the right neck extending to the upper chest anterior to the clavicle with central necrosis and surrounding lymphadenopathy. Differential again includes malignancy (sarcoma, lymphoma), or infection (including TB. Sampling is recommended. Electronically Signed   By: Valetta Mole M.D.   On: 11/10/2022 16:27   CT CHEST ABDOMEN PELVIS W CONTRAST  Result Date: 11/10/2022 CLINICAL DATA:  Lymphoma, neck mass, metastatic disease evaluation * Tracking Code: BO * EXAM: CT CHEST, ABDOMEN, AND PELVIS WITH  CONTRAST TECHNIQUE: Multidetector CT imaging of the chest, abdomen and pelvis was performed following the standard protocol during bolus administration of intravenous contrast. RADIATION DOSE REDUCTION: This exam was performed according to the departmental dose-optimization program which includes automated exposure control, adjustment of the mA and/or kV according to patient size and/or use of iterative reconstruction technique. CONTRAST:  170m OMNIPAQUE IOHEXOL 350 MG/ML SOLN COMPARISON:  CT neck, 11/04/2022, CT pelvis, 02/09/2017 FINDINGS: CT CHEST FINDINGS Cardiovascular: No significant vascular findings. Normal heart size. No pericardial effusion. Mediastinum/Nodes: Enlarged right lower cervical, supraclavicular, and bilateral axillary lymph nodes, largest right axillary nodes measuring up to 2.2 x 1.7 cm (series 3, image 18). Diffuse fat stranding throughout the superior mediastinum (series 3, image 19), thyroid gland, trachea, and esophagus demonstrate no significant findings. Lungs/Pleura: Lungs are clear. No pleural effusion or pneumothorax. Musculoskeletal: Partially imaged, heterogeneous mass centered in the anterior right neck, incompletely imaged and better assessed by simultaneous dedicated examination of the neck (series 3, image 1). No acute osseous findings. CT ABDOMEN PELVIS FINDINGS Hepatobiliary: No solid liver abnormality is seen. No gallstones, gallbladder wall thickening, or biliary dilatation. Pancreas: Unremarkable. No pancreatic ductal dilatation or surrounding inflammatory changes. Spleen: Normal in size without significant abnormality. Adrenals/Urinary Tract: Adrenal glands are unremarkable. Kidneys are normal, without renal calculi, solid lesion, or hydronephrosis. Bladder is unremarkable. Stomach/Bowel: Stomach is within normal limits. Appendix appears normal. No evidence of bowel wall thickening, distention, or inflammatory changes. Vascular/Lymphatic: No significant vascular findings  are present. Prominent subcentimeter retroperitoneal and iliac lymph nodes, for example a right external iliac node measuring 1.1 x 0.8 cm (series 3, image 102). Reproductive: No mass or other abnormality. Other: No abdominal wall hernia or abnormality. No ascites. Diffuse retroperitoneal fat stranding (series 3, image 80). Musculoskeletal: No acute osseous findings. IMPRESSION: 1. Partially imaged, heterogeneous mass centered in the anterior right neck, incompletely imaged and better assessed by simultaneous dedicated examination of the neck. As previously reported, general differential considerations include both malignancy and infectious process such as scrofula. 2. Enlarged right lower cervical, supraclavicular, and bilateral axillary lymph nodes, which may be reactive although are generally concerning for metastatic disease. 3. Prominent subcentimeter retroperitoneal and iliac lymph nodes, nonspecific although as  above concerning for additional nodal metastatic disease. 4. Diffuse fat stranding throughout the superior mediastinum, of uncertain nature, possibly reflecting mediastinal infectious involvement or alternately vascular/lymphatic congestion. Prior thoracic radiotherapy could likewise have this appearance. 5. Diffuse retroperitoneal fat stranding, as above of uncertain significance, possibly infectious or inflammatory, for example incidental and related to pancreatitis, or as above perhaps related to prior abdominal radiotherapy. 6. No mass or evidence of organ metastatic disease in the abdomen or pelvis. Electronically Signed   By: Delanna Ahmadi M.D.   On: 11/10/2022 16:26   DG Chest 2 View  Result Date: 11/10/2022 CLINICAL DATA:  Dyspnea EXAM: CHEST - 2 VIEW COMPARISON:  06/27/2016 FINDINGS: Small areas of consolidation or volume loss identified right middle lobe and lingula. No pneumothorax or pleural effusion. Normal pulmonary vasculature. Unremarkable cardiac silhouette. Unremarkable osseous  structures. IMPRESSION: Medial bibasilar subsegmental atelectasis or consolidation. Electronically Signed   By: Sammie Bench M.D.   On: 11/10/2022 14:53   CT SOFT TISSUE NECK W CONTRAST  Result Date: 11/05/2022 CLINICAL DATA:  Neck mass. EXAM: CT NECK WITH CONTRAST TECHNIQUE: Multidetector CT imaging of the neck was performed using the standard protocol following the bolus administration of intravenous contrast. RADIATION DOSE REDUCTION: This exam was performed according to the departmental dose-optimization program which includes automated exposure control, adjustment of the mA and/or kV according to patient size and/or use of iterative reconstruction technique. CONTRAST:  72m OMNIPAQUE IOHEXOL 350 MG/ML SOLN COMPARISON:  CT Neck 07/23/22 FINDINGS: Pharynx and larynx: Normal. No mass or swelling. Salivary glands: No inflammation, mass, or stone. Thyroid: Normal. Lymph nodes: There are prominent lymph nodes, for example a 1.2 cm level 1 a lymph node, unchanged from prior exam. There is also a prominent 8 mm right level 3 lymph, slightly increased in size from prior exam. Along the inferior aspect of the right submandibular gland there is a 2.8 x 1.8 cm lesion with central hypodensity (series 3, image 56) which could represent a centrally necrotic lymph node. Vascular: There is mass effect on the right internal jugular vein (series 3, image 68). Limited intracranial: Negative. Visualized orbits: Negative. Mastoids and visualized paranasal sinuses: Clear. Skeleton: No acute or aggressive process. Upper chest: See below.  No focal pulmonary nodule is visualized. Other: There is marked interval worsening in the degree soft tissue abnormality seen in the right neck compared to prior exam dated 07/23/2022. There is now a large soft tissue mass along the anterior chest wall, adjacent to the sternoclavicular joint on the right measuring 4.2 x 3 4 x 5.6 cm (series 3, image 5). There is infiltrative soft tissue  surrounding this mass that extends superiorly along the supraclavicular region to the level of the hyoid bone and submandibular region (series 3, image 64). Inferiorly this mass likely now also extends into the mediastinum where there is soft tissue stranding (series 3, image 115). Infiltrative soft tissues also seen along the left supraclavicular region (series 3, image 77), but to a lesser degree than the right. Portions of this mass appear to have a cutaneous component, for example along the right aspect of the chin (series 3, image 59), as well as the portion adjacent to the right sternoclavicular joint (series 3, image 79). IMPRESSION: 1. Marked interval worsening in the degree of soft tissue abnormality seen in the right neck compared to prior exam dated 07/23/2022. There is now a multi spatial infiltrative soft tissue mass involving a large portion of the right neck. Inferiorly this mass extends into the  mediastinum and superiorly this mass extends to the submandibular region. Infiltrative soft tissue is also seen along the left supraclavicular region. Portions of this mass appear to have a cutaneous component, for example along the right aspect of the chin, as well as the right sternoclavicular joint. Findings are concerning for malignancy, with differential considerations including lymphoma or sarcoma. Tissue sampling is recommended. Additionally CT of the chest is also recommended to fully characterize the mediastinal extent of this mass. 2. 2.8 x 1.8 cm lesion with central hypodensity along the inferior aspect of the right submandibular gland could represent a centrally necrotic lymph node. Electronically Signed   By: Marin Roberts M.D.   On: 11/05/2022 10:29    ASSESSMENT & PLAN:    41 year old male with HIV/AIDS with newly diagnosed Burkitt's lymphoma   #1 Newly diagnosed Burkitt's lymphoma Presenting with rapidly growing right neck mass extending into the chest, bilateral x-ray lymphadenopathy  and also concern for possible involvement of retroperitoneal lymph nodes. Concerning for at least stage II possibly stage III disease. Some borderline lymphadenopathy in the abdomen could also be from his recently diagnosed HIV/AIDS. No clinical symptoms suggestive of CNS involvement at this time. No constitutional symptoms. No significant cytopenias to suggest bone marrow involvement. Discussed with patient and he is not interested in fertility preservation or sperm banking. First presented in August at [facility] and was treated empirically with steroids and antibiotics with improvement in swelling prior to additional progression.   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  Patient reports this was completely treated.   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  On allopurinol for TLS prophylaxis   PLAN: -Patient notes no acute toxicities from cycle 1 day 2 of daEPOCH-R chemotherapy for his newly diagnosed Burkitt's lymphoma. -Labs today are stable -Continue allopurinol for TLS prophylaxis -Rituxan and Neulasta as outpatient at [facility] on 11/25/2022 -Will be going down for PET CT scan and MRI brain today on 11/19/2022 -He will need fluoroscopically guided diagnostic lumbar puncture with intrathecal methotrexate for CNS prophylaxis on 11/20/2022.  Orders placed. -Salt and baking soda mouthwashes 4 times a day to reduce mucositis. -On MiraLAX and senna S for constipation -Daily CBC, CMP and uric acid. -On Bactrim for PJP prophylaxis.  -HIV AIDS management per infectious disease currently on Biktarvy.  The total time spent in the appointment was 25 minutes*.  All of the patient's questions were answered with apparent satisfaction. The patient knows to call the clinic with any problems, questions or concerns.   Sullivan Lone MD MS AAHIVMS [facility] Hematology/Oncology  Physician [facility]  .*Total Encounter Time as defined by the Centers for Medicare and Medicaid Services includes, in addition to the face-to-face time of a patient visit (documented in the note above) non-face-to-face time: obtaining and reviewing outside history, ordering and reviewing medications, tests or procedures, care coordination (communications with other health care professionals or caregivers) and documentation in the medical record.   11/19/2022 11:57 AM    I, Cleda Mccreedy, am acting as a Education administrator for Sullivan Lone, MD. .I have reviewed the above documentation for accuracy and completeness, and I agree with the above. Sullivan Lone MD

## 2022-11-19 NOTE — Progress Notes (Signed)
PROGRESS NOTE    Logan Ward  OIZ:[id number] DOB: February 15, 1981 DOA: 11/10/2022 PCP: Merryl Hacker, No     Brief Narrative:  Logan Ward is a 41 y.o. male with medical history of HIV/AIDS and syphilis presented with a rapidly growing mass in the right anterior chest wall.  On presentation, CT scan revealed 8 cm x 5.9 cm x 7.4 cm soft tissue mass with centralized necrosis extending anterior to the thyroid and into the upper chest with surrounding lymphadenopathy.  ID/cardiothoracic surgery/ENT/IR were consulted.  He underwent ultrasound-guided biopsy of the right neck mass by IR on 11/12/2022.  IV antibiotics were subsequently started after the biopsy.  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  Oncology was consulted.  Patient was transferred to [facility] 12/1 and planning to undergo bone marrow biopsy,  port placement, PET and initiate chemotherapy 12/4. PICC line placed 12/3 but since he was able to get his port placed, PICC line was subsequently removed.  He underwent bone marrow biopsy and port placement 12/4 and was started on chemotherapy 12/4.  New events last 24 hours / Subjective: Patient is n.p.o. for nuc med study.  He is asking for his pain medication  Assessment & Plan:   Principal Problem:   Chest mass Active Problems:   AIDS (acquired immune deficiency syndrome) ([place])   Goals of care, counseling/discussion   History of syphilis   Burkitt lymphoma of lymph nodes of multiple regions [facility])   Counseling regarding advance care planning and goals of care   Encounter for antineoplastic chemotherapy   New diagnosis Burkitt's lymphoma -Status post ultrasound-guided biopsy of right neck mass by IR 11/28 -Final pathology consistent with B-cell lymphoma -Oncology consulted -Status post bone marrow biopsy and port placement 12/4.  Started chemotherapy 12/4  -Prophylactic allopurinol for tumor lysis syndrome -Decadron -MRI brain, PET scan, LP pending for  staging  HIV/AIDS -Appreciate infectious disease -Continue Biktarvy, Bactrim    DVT prophylaxis:  SCDs Start: 11/10/22 1746  Code Status: Full code Family Communication: No family at bedside Disposition Plan:  Status is: Inpatient Remains inpatient appropriate because: Continue chemotherapy   Antimicrobials:  Anti-infectives (From admission, onward)    Start     Dose/Rate Route Frequency Ordered Stop   11/14/22 1015  amoxicillin-clavulanate (AUGMENTIN) 875-125 MG per tablet 1 tablet  Status:  Discontinued        1 tablet Oral Every 12 hours 11/14/22 0929 11/14/22 1541   11/14/22 1015  doxycycline (VIBRA-TABS) tablet 100 mg  Status:  Discontinued        100 mg Oral Every 12 hours 11/14/22 0929 11/14/22 1541   11/14/22 0000  amoxicillin-clavulanate (AUGMENTIN) 875-125 MG tablet  Status:  Discontinued        1 tablet Oral 2 times daily 11/14/22 0923 11/14/22    11/14/22 0000  doxycycline (VIBRAMYCIN) 100 MG capsule  Status:  Discontinued        100 mg Oral 2 times daily 11/14/22 0923 11/14/22    11/13/22 0200  vancomycin (VANCOREADY) IVPB 1500 mg/300 mL  Status:  Discontinued        1,500 mg 150 mL/hr over 120 Minutes Intravenous Every 12 hours 11/12/22 1245 11/14/22 0929   11/12/22 1400  vancomycin (VANCOREADY) IVPB 2000 mg/400 mL        2,000 mg 200 mL/hr over 120 Minutes Intravenous  Once 11/12/22 1245 11/12/22 1738   11/12/22 1330  Ampicillin-Sulbactam (UNASYN) 3 g in sodium chloride 0.9 % 100 mL IVPB  Status:  Discontinued  3 g 200 mL/hr over 30 Minutes Intravenous Every 6 hours 11/12/22 1238 11/14/22 0929   11/10/22 1900  bictegravir-emtricitabine-tenofovir AF (BIKTARVY) 50-200-25 MG per tablet 1 tablet        1 tablet Oral Daily 11/10/22 1744     11/10/22 1745  sulfamethoxazole-trimethoprim (BACTRIM DS) 800-160 MG per tablet 1 tablet        1 tablet Oral Daily 11/10/22 1744          Objective: Vitals:   11/18/22 1130 11/18/22 1326 11/18/22 2122 11/19/22  0450  BP: (!) 146/81 (!) 141/81 127/66 (!) 115/54  Pulse: 84 78 72 64  Resp: (!) _0 Temp:  97.7 F (36.5 C) 98 F (36.7 C) 98 F (36.7 C)  TempSrc:  Oral Oral Oral  SpO2: 98% 97% 97% 98%  Weight:      Height:        Intake/Output Summary (Last 24 hours) at 11/19/2022 1046 Last data filed at 11/19/2022 0600 Gross per 24 hour  Intake 1367.4 ml  Output --  Net 1367.4 ml    Filed Weights   11/11/22 0730 11/11/22 1755  Weight: 123.4 kg 127.7 kg   Examination: General exam: Appears calm and comfortable    Data Reviewed: I have personally reviewed following labs and imaging studies  CBC: Recent Labs  Lab 11/16/22 0702 11/17/22 0928 11/18/22 0500 11/19/22 0545  WBC 9.0 9.4 8.3 7.6  NEUTROABS 5.8 5.4 4.9 4.8  HGB 13.2 12.5* 12.7* 12.7*  HCT 42.4 39.8 40.6 39.7  MCV 84.3 84.9 85.3 84.3  PLT 255 251 266 299    Basic Metabolic Panel: Recent Labs  Lab 11/15/22 0800 11/16/22 0702 11/17/22 0928 11/18/22 0500 11/19/22 0545  NA 133* 136 136 133* 132*  K 4.8 4.0 4.4 4.6 4.4  CL 101 102 103 102 101  CO2 _1 GLUCOSE 131* 123* 89 109* 109*  BUN _2 CREATININE 1.06 1.06 0.84 1.04 0.93  CALCIUM 9.8 9.6 9.2 9.1 8.9    GFR: Estimated Creatinine Clearance: 149.3 mL/min (by C-G formula based on SCr of 0.93 mg/dL). Liver Function Tests: Recent Labs  Lab 11/16/22 0702 11/17/22 0928 11/18/22 0500 11/19/22 0545  AST _3 ALT _4 ALKPHOS 49 48 43 45  BILITOT 0.7 1.3* 1.2 1.0  PROT 8.3* 7.9 7.0 7.3  ALBUMIN 4.0 3.9 3.5 3.4*    No results for input(s): "LIPASE", "AMYLASE" in the last 168 hours. No results for input(s): "AMMONIA" in the last 168 hours. Coagulation Profile: No results for input(s): "INR", "PROTIME" in the last 168 hours. Cardiac Enzymes: No results for input(s): "CKTOTAL", "CKMB", "CKMBINDEX", "TROPONINI" in the last 168 hours. BNP (last 3 results) No results for input(s): "PROBNP" in the last 8760  hours. HbA1C: No results for input(s): "HGBA1C" in the last 72 hours. CBG: No results for input(s): "GLUCAP" in the last 168 hours. Lipid Profile: No results for input(s): "CHOL", "HDL", "LDLCALC", "TRIG", "CHOLHDL", "LDLDIRECT" in the last 72 hours. Thyroid Function Tests: No results for input(s): "TSH", "T4TOTAL", "FREET4", "T3FREE", "THYROIDAB" in the last 72 hours. Anemia Panel: No results for input(s): "VITAMINB12", "FOLATE", "FERRITIN", "TIBC", "IRON", "RETICCTPCT" in the last 72 hours. Sepsis Labs: No results for input(s): "PROCALCITON", "LATICACIDVEN" in the last 168 hours.  Recent Results (from the past 240 hour(s))  Resp Panel by RT-PCR (Flu A&B, Covid) Anterior Nasal Swab     Status: None  Collection Time: 11/10/22  2:03 PM   Specimen: Anterior Nasal Swab  Result Value Ref Range Status   SARS Coronavirus 2 by RT PCR NEGATIVE NEGATIVE Final    Comment: (NOTE) SARS-CoV-2 target nucleic acids are NOT DETECTED.  The SARS-CoV-2 RNA is generally detectable in upper respiratory specimens during the acute phase of infection. The lowest concentration of SARS-CoV-2 viral copies this assay can detect is 138 copies/mL. A negative result does not preclude SARS-Cov-2 infection and should not be used as the sole basis for treatment or other patient management decisions. A negative result may occur with  improper specimen collection/handling, submission of specimen other than nasopharyngeal swab, presence of viral mutation(s) within the areas targeted by this assay, and inadequate number of viral copies(<138 copies/mL). A negative result must be combined with clinical observations, patient history, and epidemiological information. The expected result is Negative.  Fact Sheet for Patients:  EntrepreneurPulse.com.au  Fact Sheet for Healthcare Providers:  IncredibleEmployment.be  This test is no t yet approved or cleared by the [place] FDA  and  has been authorized for detection and/or diagnosis of SARS-CoV-2 by FDA under an Emergency Use Authorization (EUA). This EUA will remain  in effect (meaning this test can be used) for the duration of the COVID-19 declaration under Section 564(b)(1) of the Act, 21 U.S.C.section 360bbb-3(b)(1), unless the authorization is terminated  or revoked sooner.       Influenza A by PCR NEGATIVE NEGATIVE Final   Influenza B by PCR NEGATIVE NEGATIVE Final    Comment: (NOTE) The Xpert Xpress SARS-CoV-2/FLU/RSV plus assay is intended as an aid in the diagnosis of influenza from Nasopharyngeal swab specimens and should not be used as a sole basis for treatment. Nasal washings and aspirates are unacceptable for Xpert Xpress SARS-CoV-2/FLU/RSV testing.  Fact Sheet for Patients: EntrepreneurPulse.com.au  Fact Sheet for Healthcare Providers: IncredibleEmployment.be  This test is not yet approved or cleared by the [place] FDA and has been authorized for detection and/or diagnosis of SARS-CoV-2 by FDA under an Emergency Use Authorization (EUA). This EUA will remain in effect (meaning this test can be used) for the duration of the COVID-19 declaration under Section 564(b)(1) of the Act, 21 U.S.C. section 360bbb-3(b)(1), unless the authorization is terminated or revoked.  Performed at [facility], [place] [address] [place], [place] [address]   Aerobic/Anaerobic Culture w Gram Stain (surgical/deep wound)     Status: None   Collection Time: 11/12/22 12:16 PM   Specimen: Tissue  Result Value Ref Range Status   Specimen Description TISSUE  Final   Special Requests NONE  Final   Gram Stain NO WBC SEEN NO ORGANISMS SEEN   Final   Culture   Final    No growth aerobically or anaerobically. Performed at [facility], [place] [address], [place], [place] [address]    Report Status 11/17/2022 FINAL  Final  Acid Fast Smear (AFB)     Status: None    Collection Time: 11/12/22 12:16 PM   Specimen: Soft Tissue Mass Excision  Result Value Ref Range Status   AFB Specimen Processing Concentration  Final   Acid Fast Smear Negative  Final    Comment: (NOTE) Performed At: [facility] [place], [place] [id number] Rush Farmer MD AF:[id number]    Source (AFB) TISSUE  Final    Comment: Performed at [facility], [place] [address], [place], [place] [address]      Radiology Studies: IR IMAGING GUIDED  PORT INSERTION  Result Date: 11/18/2022 INDICATION: 41 year old male with history of Burkitt lymphoma requiring central venous access for chemotherapy administration. EXAM: IMPLANTED PORT A CATH PLACEMENT WITH ULTRASOUND AND FLUOROSCOPIC GUIDANCE COMPARISON:  None Available. MEDICATIONS: None. ANESTHESIA/SEDATION: Moderate (conscious) sedation was employed during this procedure. A total of Versed 2 mg and Fentanyl 100 mcg was administered intravenously. Moderate Sedation Time: 22 minutes. The patient's level of consciousness and vital signs were monitored continuously by radiology nursing throughout the procedure under my direct supervision. CONTRAST:  None FLUOROSCOPY TIME:  Three mGy COMPLICATIONS: None immediate. PROCEDURE: The procedure, risks, benefits, and alternatives were explained to the patient. Questions regarding the procedure were encouraged and answered. The patient understands and consents to the procedure. The left neck and chest were prepped with chlorhexidine in a sterile fashion, and a sterile drape was applied covering the operative field. Maximum barrier sterile technique with sterile gowns and gloves were used for the procedure. A timeout was performed prior to the initiation of the procedure. Ultrasound was used to examine the jugular vein which was compressible and free of internal echoes. A skin marker was used to demarcate the planned venotomy and port pocket incision sites. Local anesthesia was provided to  these sites and the subcutaneous tunnel track with 1% lidocaine with 1:100,000 epinephrine. A small incision was created at the jugular access site and blunt dissection was performed of the subcutaneous tissues. Under ultrasound guidance, the jugular vein was accessed with a 21 ga micropuncture needle and an 0.018" wire was inserted to the superior vena cava. Real-time ultrasound guidance was utilized for vascular access including the acquisition of a permanent ultrasound image documenting patency of the accessed vessel. A 5 Fr micopuncture set was then used, through which a 0.035" Rosen wire was passed under fluoroscopic guidance into the inferior vena cava. An 8 Fr dilator was then placed over the wire. A subcutaneous port pocket was then created along the upper chest wall utilizing a combination of sharp and blunt dissection. The pocket was irrigated with sterile saline, packed with gauze, and observed for hemorrhage. A single lumen "ISP" sized power injectable port was chosen for placement. The 8 Fr catheter was tunneled from the port pocket site to the venotomy incision. The port was placed in the pocket. The external catheter was trimmed to appropriate length. The dilator was exchanged for an 8 Fr peel-away sheath under fluoroscopic guidance. The catheter was then placed through the sheath and the sheath was removed. Final catheter positioning was confirmed and documented with a fluoroscopic spot radiograph. The port was accessed with a Huber needle, aspirated, and flushed with heparinized saline. The deep dermal layer of the port pocket incision was closed with interrupted 3-0 Vicryl suture. The skin was opposed with a running subcuticular 4-0 Monocryl suture. Dermabond was then placed over the port pocket and neck incisions. The patient tolerated the procedure well without immediate post procedural complication. FINDINGS: After catheter placement, the tip lies within the superior cavoatrial junction. The  catheter aspirates and flushes normally and is ready for immediate use. IMPRESSION: Successful placement of a power injectable Port-A-Cath via the left internal jugular vein. The catheter is ready for immediate use. Ruthann Cancer, MD [facility] Radiology Electronically Signed   By: Ruthann Cancer M.D.   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 EXAM: CT-GUIDED BONE MARROW BIOPSY AND ASPIRATION MEDICATIONS: None ANESTHESIA/SEDATION:  Fentanyl 100 mcg IV; Versed 2 mg IV Sedation Time: 16 minutes; The patient was continuously monitored during the procedure by the interventional radiology nurse under my direct supervision. COMPLICATIONS: None immediate. PROCEDURE: Informed consent was obtained from the patient following an explanation of the procedure, risks, benefits and alternatives. The patient understands, agrees and consents for the procedure. All questions were addressed. A time out was performed prior to the initiation of the procedure. The patient was positioned prone and non-contrast localization CT was performed of the pelvis to demonstrate the iliac marrow spaces. The operative site was prepped and draped in the usual sterile fashion. Under sterile conditions and local anesthesia, a 22 gauge spinal needle was utilized for procedural planning. Next, an 11 gauge coaxial bone biopsy needle was advanced into the right iliac marrow space. Needle position was confirmed with CT imaging. Initially, a bone marrow aspiration was performed. Next, a bone marrow biopsy was obtained with the 11 gauge outer bone marrow device. Samples were prepared with the cytotechnologist and deemed adequate. The needle was removed and superficial hemostasis was obtained with manual compression. A dressing was applied. The patient tolerated the procedure well without immediate post  procedural complication. IMPRESSION: Successful CT guided right iliac bone marrow aspiration and core biopsy. Ruthann Cancer, MD [name] Radiology Electronically Signed   By: Ruthann Cancer M.D.   On: 11/18/2022 11:34      Scheduled Meds:  allopurinol  100 mg Oral BID   bictegravir-emtricitabine-tenofovir AF  1 tablet Oral Daily   Chlorhexidine Gluconate Cloth  6 each Topical Daily   dexamethasone  6 mg Oral BID WC   DOXOrubicin (ADRIAMYCIN) 26 mg, etoposide (VEPESID) 128 mg, vinCRIStine (ONCOVIN) 1 mg in sodium chloride 0.9 % 1,000 mL chemo infusion   Intravenous Once   polyethylene glycol  17 g Oral Daily   senna-docusate  1 tablet Oral BID   sodium bicarbonate/sodium chloride   Mouth Rinse QID   sodium chloride flush  10-40 mL Intracatheter Q12H   sulfamethoxazole-trimethoprim  1 tablet Oral Daily   Continuous Infusions:  ondansetron (ZOFRAN) 8 mg, dexamethasone (DECADRON) 10 mg in sodium chloride 0.9 % 50 mL IVPB       LOS: 9 days     Dessa Phi, DO [facility] 11/19/2022, 10:46 AM   Available via Epic secure chat 7am-7pm After these hours, please refer to coverage provider listed on [email or web address]

## 2022-11-20 ENCOUNTER — Encounter: Payer: Self-pay | Admitting: Hematology

## 2022-11-20 ENCOUNTER — Inpatient Hospital Stay (HOSPITAL_COMMUNITY): Payer: 59

## 2022-11-20 DIAGNOSIS — R222 Localized swelling, mass and lump, trunk: Secondary | ICD-10-CM | POA: Diagnosis not present

## 2022-11-20 DIAGNOSIS — Z5111 Encounter for antineoplastic chemotherapy: Secondary | ICD-10-CM | POA: Diagnosis not present

## 2022-11-20 DIAGNOSIS — C8378 Burkitt lymphoma, lymph nodes of multiple sites: Secondary | ICD-10-CM | POA: Diagnosis not present

## 2022-11-20 LAB — COMPREHENSIVE METABOLIC PANEL
ALT: 18 U/L (ref 0–44)
AST: 22 U/L (ref 15–41)
Albumin: 3.4 g/dL — ABNORMAL LOW (ref 3.5–5.0)
Alkaline Phosphatase: 44 U/L (ref 38–126)
Anion gap: 8 (ref 5–15)
BUN: 20 mg/dL (ref 6–20)
CO2: 23 mmol/L (ref 22–32)
Calcium: 8.6 mg/dL — ABNORMAL LOW (ref 8.9–10.3)
Chloride: 98 mmol/L (ref 98–111)
Creatinine, Ser: 0.95 mg/dL (ref 0.61–1.24)
GFR, Estimated: >60 mL/min (ref >60)
Glucose, Bld: 122 mg/dL — ABNORMAL HIGH (ref 70–99)
Potassium: 4.7 mmol/L (ref 3.5–5.1)
Sodium: 129 mmol/L — ABNORMAL LOW (ref 135–145)
Total Bilirubin: 1.1 mg/dL (ref 0.3–1.2)
Total Protein: 7.1 g/dL (ref 6.5–8.1)

## 2022-11-20 LAB — SURGICAL PATHOLOGY

## 2022-11-20 LAB — CSF CELL COUNT WITH DIFFERENTIAL
Lymphs, CSF: 91 % — ABNORMAL HIGH (ref 40–80)
Monocyte-Macrophage-Spinal Fluid: 9 % — ABNORMAL LOW (ref 15–45)
RBC Count, CSF: 22 /mm3 — ABNORMAL HIGH
Tube #: 4
WBC, CSF: 9 /mm3 — ABNORMAL HIGH (ref 0–5)

## 2022-11-20 LAB — CBC WITH DIFFERENTIAL/PLATELET
Abs Immature Granulocytes: 0.05 10*3/uL (ref 0.00–0.07)
Basophils Absolute: 0 10*3/uL (ref 0.0–0.1)
Basophils Relative: 0 %
Eosinophils Absolute: 0 10*3/uL (ref 0.0–0.5)
Eosinophils Relative: 0 %
HCT: 41.7 % (ref 39.0–52.0)
Hemoglobin: 13.3 g/dL (ref 13.0–17.0)
Immature Granulocytes: 1 %
Lymphocytes Relative: 29 %
Lymphs Abs: 1.1 10*3/uL (ref 0.7–4.0)
MCH: 27 pg (ref 26.0–34.0)
MCHC: 31.9 g/dL (ref 30.0–36.0)
MCV: 84.6 fL (ref 80.0–100.0)
Monocytes Absolute: 0.1 10*3/uL (ref 0.1–1.0)
Monocytes Relative: 2 %
Neutro Abs: 2.5 10*3/uL (ref 1.7–7.7)
Neutrophils Relative %: 68 %
Platelets: 230 10*3/uL (ref 150–400)
RBC: 4.93 MIL/uL (ref 4.22–5.81)
RDW: 14.3 % (ref 11.5–15.5)
WBC: 3.7 10*3/uL — ABNORMAL LOW (ref 4.0–10.5)
nRBC: 0 % (ref 0.0–0.2)

## 2022-11-20 LAB — GLUCOSE, CAPILLARY: Glucose-Capillary: 91 mg/dL (ref 70–99)

## 2022-11-20 LAB — GLUCOSE, CSF: Glucose, CSF: 75 mg/dL — ABNORMAL HIGH (ref 40–70)

## 2022-11-20 LAB — URIC ACID: Uric Acid, Serum: 5.8 mg/dL (ref 3.7–8.6)

## 2022-11-20 LAB — PROTEIN, CSF: Total  Protein, CSF: 31 mg/dL (ref 15–45)

## 2022-11-20 MED ORDER — SODIUM CHLORIDE 0.9 % IV SOLN
Freq: Once | INTRAVENOUS | Status: AC
  Administered 2022-11-20: 18 mg via INTRAVENOUS
  Filled 2022-11-20: qty 4

## 2022-11-20 MED ORDER — METOPROLOL TARTRATE 5 MG/5ML IV SOLN: 5.0000 mg | INTRAVENOUS | Status: DC | PRN

## 2022-11-20 MED ORDER — IPRATROPIUM-ALBUTEROL 0.5-2.5 (3) MG/3ML IN SOLN: 3.0000 mL | RESPIRATORY_TRACT | Status: DC | PRN

## 2022-11-20 MED ORDER — HYDRALAZINE HCL 20 MG/ML IJ SOLN: 10.0000 mg | INTRAMUSCULAR | Status: DC | PRN

## 2022-11-20 MED ORDER — SULFAMETHOXAZOLE-TRIMETHOPRIM 800-160 MG PO TABS
1.0000 | ORAL_TABLET | Freq: Every day | ORAL | Status: DC
  Administered 2022-11-22: 1 via ORAL
  Filled 2022-11-20: qty 1

## 2022-11-20 MED ORDER — ENSURE ENLIVE PO LIQD
237.0000 mL | Freq: Two times a day (BID) | ORAL | Status: DC
  Administered 2022-11-20 – 2022-11-22 (×2): 237 mL via ORAL

## 2022-11-20 MED ORDER — TRAZODONE HCL 50 MG PO TABS: 50.0000 mg | ORAL_TABLET | Freq: Every evening | ORAL | Status: DC | PRN

## 2022-11-20 MED ORDER — GUAIFENESIN 100 MG/5ML PO LIQD: 5.0000 mL | ORAL | Status: DC | PRN

## 2022-11-20 MED ORDER — LIDOCAINE HCL (PF) 1 % IJ SOLN
INTRAMUSCULAR | Status: AC
  Administered 2022-11-20: 5 mL via INTRADERMAL
  Filled 2022-11-20: qty 5

## 2022-11-20 MED ORDER — SODIUM CHLORIDE (PF) 0.9 % IJ SOLN
Freq: Once | INTRAMUSCULAR | Status: AC
  Filled 2022-11-20: qty 0.48

## 2022-11-20 MED ORDER — VINCRISTINE SULFATE CHEMO INJECTION 1 MG/ML
Freq: Once | INTRAVENOUS | Status: AC
  Filled 2022-11-20: qty 13

## 2022-11-20 MED ORDER — OXYCODONE HCL 5 MG PO TABS: 5.0000 mg | ORAL_TABLET | ORAL | Status: DC | PRN

## 2022-11-20 MED ORDER — LIDOCAINE HCL 1 % IJ SOLN
5.0000 mL | Freq: Once | INTRAMUSCULAR | Status: DC
  Filled 2022-11-20: qty 5

## 2022-11-20 NOTE — Progress Notes (Signed)
[facility]    Date of Admission:  11/10/2022     ID: Logan Ward is a 41 y.o. 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 (Cervical, neck, and axillary mass)  Principal Problem:   Chest mass Active Problems:   AIDS (acquired immune deficiency syndrome) (HCC)   Goals of care, counseling/discussion   History of syphilis   Burkitt lymphoma of lymph nodes of multiple regions [facility])   Counseling regarding advance care planning and goals of care   Encounter for antineoplastic chemotherapy    Subjective: Afebrile. Feeling that mass is decreasing in size.  Medications:   allopurinol  100 mg Oral BID   bictegravir-emtricitabine-tenofovir AF  1 tablet Oral Daily   Chlorhexidine Gluconate Cloth  6 each Topical Daily   dexamethasone  6 mg Oral BID WC   DOXOrubicin (ADRIAMYCIN) 26 mg, etoposide (VEPESID) 128 mg, vinCRIStine (ONCOVIN) 1 mg in sodium chloride 0.9 % 1,000 mL chemo infusion   Intravenous Once   feeding supplement  237 mL Oral BID BM   lidocaine  5 mL Other Once   polyethylene glycol  17 g Oral Daily   senna-docusate  1 tablet Oral BID   sodium bicarbonate/sodium chloride   Mouth Rinse QID   sodium chloride flush  10-40 mL Intracatheter Q12H   [START ON 11/22/2022] sulfamethoxazole-trimethoprim  1 tablet Oral Daily    Objective: Vital signs in last 24 hours: Temp:  [98 F (36.7 C)-98.3 F (36.8 C)] 98.1 F (36.7 C) (12/06 1354) Pulse Rate:  [58-77] 71 (12/06 1354) Resp:  [18-20] 20 (12/06 1354) BP: (112-122)/(60-69) 117/60 (12/06 1354) SpO2:  [97 %-99 %] 97 % (12/06 1354)  Physical Exam  Constitutional: He is oriented to person, place, and time. He appears well-developed and well-nourished. No distress.  HENT:  Mouth/Throat: Oropharynx is clear and moist. No oropharyngeal exudate.  Cardiovascular: Normal rate, regular rhythm and normal heart sounds. Exam reveals no gallop and no  friction rub.  No murmur heard.  Chest wall: lymphadenopathy... port in place. Pulmonary/Chest: Effort normal and breath sounds normal. No respiratory distress. He has no wheezes.  Abdominal: Soft. Bowel sounds are normal. He exhibits no distension. There is no tenderness.  Lymphadenopathy:  He has no cervical adenopathy.  Neurological: He is alert and oriented to person, place, and time.  Skin: Skin is warm and dry. No rash noted. No erythema.  Psychiatric: He has a normal mood and affect. His behavior is normal.    Lab Results Recent Labs    11/19/22 0545 11/20/22 0552  WBC 7.6 3.7*  HGB 12.7* 13.3  HCT 39.7 41.7  NA 132* 129*  K 4.4 4.7  CL 101 98  CO2 24 23  BUN 17 20  CREATININE 0.93 0.95   Liver Panel Recent Labs    11/19/22 0545 11/20/22 0552  PROT 7.3 7.1  ALBUMIN 3.4* 3.4*  AST 20 22  ALT 14 18  ALKPHOS 45 44  BILITOT 1.0 1.1   Lab Results  Component Value Date   ESRSEDRATE 10 11/10/2022   Lab Results  Component Value Date   CRP 4.2 (H) 11/10/2022    Microbiology: --------------- Studies/Results: DG Fluoro Guide Spinal/SI Jt Inj Left  Result Date: 11/20/2022 CLINICAL DATA:  Burkitt lymphoma, chemotherapy injection. EXAM: DIAGNOSTIC LUMBAR PUNCTURE UNDER FLUOROSCOPIC GUIDANCE COMPARISON:  None Available. FLUOROSCOPY: Radiation Exposure Index (as provided by the fluoroscopic device): 14.9 mGy Kerma PROCEDURE: Procedure was discussed  with the patient including risks and benefits. Patient's questions were answered. Written informed consent for the procedure was obtained. Timeout protocol followed. L3-4 disc space was localized under C-arm fluoroscopy. Skin prepped and draped in usual sterile fashion. Skin and soft tissues anesthetized with  10 mL of 1% lidocaine. Under sterile technique, 20 gauge spinal needle advanced into spinal canal. Clear colorless CSF was encountered. 7 mL of CSF was obtained for requested analysis. Patient was then injected with 12 mg  of methotrexate intrathecally. Procedure tolerated well by patient without immediate complication. IMPRESSION: Successful lumbar puncture with intrathecal methotrexate administration. Electronically Signed   By: Lorin Picket M.D.   On: 11/20/2022 14:08   DG FL GUIDED LUMBAR PUNCTURE  Result Date: 11/20/2022 Lorin Picket, MD     11/20/2022  2:08 PM CLINICAL DATA: [Burkitt lymphoma, chemotherapy injection.] EXAM: DIAGNOSTIC LUMBAR PUNCTURE UNDER FLUOROSCOPIC GUIDANCE COMPARISON: [None Available.] FLUOROSCOPY: Radiation Exposure Index (as provided by the fluoroscopic device): [14.9] [mGy Kerma] PROCEDURE:   Procedure was discussed with the patient including risks and benefits.  Patient's questions were answered. Written informed consent for the procedure was obtained. Timeout protocol followed.  [L3-4] disc space was localized under C-arm fluoroscopy.  Skin prepped and draped in usual sterile fashion. Skin and soft tissues anesthetized with '[]'$  10 mL of 1% lidocaine. Under sterile technique, [20 gauge] spinal needle advanced into spinal canal. Clear colorless CSF was encountered. 7 mL of CSF was obtained for requested analysis. Patient was then injected with 12 mg of methotrexate intrathecally. Procedure tolerated well by patient without immediate complication.   IMPRESSION: [Successful lumbar puncture with intrathecal methotrexate administration.] Lorin Picket, mD  MR BRAIN W WO CONTRAST  Result Date: 11/19/2022 CLINICAL DATA:  Lymphoma staging. EXAM: MRI HEAD WITHOUT AND WITH CONTRAST TECHNIQUE: Multiplanar, multiecho pulse sequences of the brain and surrounding structures were obtained without and with intravenous contrast. CONTRAST:  52m GADAVIST GADOBUTROL 1 MMOL/ML IV SOLN COMPARISON:  None Available. FINDINGS: Brain: No acute infarction, hemorrhage, hydrocephalus, extra-axial collection or mass lesion. Vascular: Normal flow voids. Skull and upper cervical spine: Normal marrow signal.  Sinuses/Orbits: Negative. Other: None. IMPRESSION: No evidence of intracranial metastatic disease. Electronically Signed   By: HMarin RobertsM.D.   On: 11/19/2022 16:17   NM PET Image Initial (PI) Skull Base To Thigh (F-18 FDG)  Result Date: 11/19/2022 CLINICAL DATA:  Initial treatment strategy for Burkitt's lymphoma. Initiation of chemotherapy same day. EXAM: NUCLEAR MEDICINE PET SKULL BASE TO THIGH TECHNIQUE: 14.3 mCi F-18 FDG was injected intravenously. Full-ring PET imaging was performed from the skull base to thigh after the radiotracer. CT data was obtained and used for attenuation correction and anatomic localization. Fasting blood glucose: 14.3 mg/dl COMPARISON:  None Available. FINDINGS: Mediastinal blood pool activity: SUV max 2.7 Liver activity: SUV max 3.8 NECK: A mat of intensely hypermetabolic RIGHT enlarged cervical lymph nodes involve the RIGHT level 2, level 3 and RIGHT supraclavicular nodes. Nodal mass beneath the sternocleidomastoid muscle measures 27 mm on image 40 with SUV max equal 20.23 RIGHT neck mass superficial to the sternocleidomastoid muscle extends to the skin surface above the RIGHT clavicle measuring 5.1 cm with SUV max equal 33 point 1 (image 44). Incidental CT findings: None. CHEST: Hypermetabolic RIGHT jugular nodal conglomerateextends into the upper thoracic inlet and RIGHT upper mediastinum with SUV max equal 17 on image 50. No lower paratracheal adenopathy.  No hilar adenopathy. There are hypermetabolic RIGHT axillary nodes which are relatively small but intensely hypermetabolic. For example 8 mm  node on image 58/4 with SUV max equal 8.6. Incidental CT findings: No suspicious pulmonary nodules. Port in the anterior chest wall with tip in distal SVC. ABDOMEN/PELVIS: No abnormal hypermetabolic activity within the liver, pancreas, adrenal glands, or spleen. No hypermetabolic lymph nodes in the abdomen or pelvis. Spleen is normal volume and normal metabolic activity. Activity  along the anterior LEFT chest wall related to port ejection is incidental/artifactual. Incidental CT findings: None. SKELETON: No abnormal marrow activity suggest lymphoma involvement. Incidental CT findings: None. IMPRESSION: 1. Enlarged intense hypermetabolic RIGHT cervical nodal mass extending from the level 2 nodal station to the RIGHT supraclavicular nodal station. Findings consistent high-grade lymphoma 2. Superficial RIGHT neck mass extends to the skin surface superficial to the RIGHT sternocleidomastoid muscle. Intense metabolic activity consistent with high-grade lymphoma. 3. Hypermetabolic small RIGHT axillary nodal disease. 4. No evidence of spleen involvement or solid organ involvement below the diaphragms. 5. No evidence of marrow involvement. Electronically Signed   By: Suzy Bouchard M.D.   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  Will have follow up appt in ID clinic for hiv management.in the next 2-5 wk  [facility] Pager: [phone]  11/20/2022, 5:12 PM

## 2022-11-20 NOTE — Progress Notes (Signed)
PROGRESS NOTE    Logan Ward  XHB:[id number] DOB: 03-08-1981 DOA: 11/10/2022 PCP: Pcp, No   Brief Narrative:  41 y.o. male with medical history of HIV/AIDS and syphilis presented with a rapidly growing mass in the right anterior chest wall.  On presentation, CT scan revealed 8 cm x 5.9 cm x 7.4 cm soft tissue mass with centralized necrosis extending anterior to the thyroid and into the upper chest with surrounding lymphadenopathy.  ID/cardiothoracic surgery/ENT/IR were consulted.  He underwent ultrasound-guided biopsy of the right neck mass by IR on 11/12/2022.  IV antibiotics were subsequently started after the biopsy.  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  Oncology was consulted.  Patient was transferred to [facility] 12/1 and planning to undergo bone marrow biopsy,  port placement, PET and initiate chemotherapy 12/4. PICC line placed 12/3 but since he was able to get his port placed, PICC line was subsequently removed.  He underwent bone marrow biopsy and port placement 12/4 and was started on chemotherapy 12/4.    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  PICC line removed, Port-A-Cath in place.  Currently there is no evidence of CNS involvement  and port placement 12/4.  Started chemotherapy 12/4  -Final pathology consistent with B-cell lymphoma -Status post bone marrow biopsy - pending.  -Prophylactic allopurinol for tumor lysis syndrome -Decadron twice daily -MRI brain is negative - PET scan shows hypermetabolic regions in  cervical, neck and axillary mass consistent with high-grade lymphoma -Per oncology diagnostic lumbar puncture and intrathecal methotrexate on 12/6   HIV/AIDS -On Biktarvy. - Bactrim for PJP prophylaxis    DVT prophylaxis: SCDs Start: 11/10/22 1746 Code Status: Full code Family Communication:    Status is: Inpatient Maintain hospital stay until cleared by oncology   Nutritional status    Signs/Symptoms: estimated needs  Interventions: Ensure Enlive (each supplement provides 350kcal and 20 grams of protein), Refer to RD note for recommendations  Body mass index is 37.14 kg/m.         Subjective:  Feeling well no complaints at this time  Examination:  General exam: Appears calm and comfortable  Respiratory system: Clear to auscultation. Respiratory effort normal. Cardiovascular system: S1 & S2 heard, RRR. No JVD, murmurs, rubs, gallops or clicks. No pedal edema. Gastrointestinal system: Abdomen is nondistended, soft and nontender. No organomegaly or masses felt. Normal bowel sounds heard. Central nervous system: Alert and oriented. No focal neurological deficits. Extremities: Symmetric 5 x 5 power. Skin: No rashes, lesions or ulcers Psychiatry: Judgement and insight appear normal. Mood & affect appropriate.     Objective: Vitals:   11/19/22 0450 11/19/22 1340 11/19/22 2004 11/20/22 0441  BP: (!) 115/54 120/73 112/68 122/69  Pulse: 64 (!) 58 77 (!) 58  Resp: _0 Temp: 98 F (36.7 C) 98 F (36.7 C) 98.3 F (36.8 C) 98 F (36.7 C)  TempSrc: Oral Oral Oral Oral  SpO2: 98% 99% 99% 99%  Weight:      Height:        Intake/Output  Summary (Last 24 hours) at 11/20/2022 0754 Last data filed at 11/20/2022 0304 Gross per 24 hour  Intake 678.6 ml  Output --  Net 678.6 ml   Filed Weights   11/11/22 0730 11/11/22 1755  Weight: 123.4 kg 127.7 kg     Data Reviewed:   CBC: Recent Labs  Lab 11/16/22 0702 11/17/22 0928 11/18/22 0500 11/19/22 0545  11/20/22 0552  WBC 9.0 9.4 8.3 7.6 3.7*  NEUTROABS 5.8 5.4 4.9 4.8 2.5  HGB 13.2 12.5* 12.7* 12.7* 13.3  HCT 42.4 39.8 40.6 39.7 41.7  MCV 84.3 84.9 85.3 84.3 84.6  PLT 255 251 266 244 619   Basic Metabolic Panel: Recent Labs  Lab 11/16/22 0702 11/17/22 0928 11/18/22 0500 11/19/22 0545 11/20/22 0552  NA 136 136 133* 132* 129*  K 4.0 4.4 4.6 4.4 4.7  CL 102 103 102 101 98  CO2 _0 GLUCOSE 123* 89 109* 109* 122*  BUN _1 CREATININE 1.06 0.84 1.04 0.93 0.95  CALCIUM 9.6 9.2 9.1 8.9 8.6*   GFR: Estimated Creatinine Clearance: 146.2 mL/min (by C-G formula based on SCr of 0.95 mg/dL). Liver Function Tests: Recent Labs  Lab 11/16/22 0702 11/17/22 0928 11/18/22 0500 11/19/22 0545 11/20/22 0552  AST _2 ALT _3 ALKPHOS 49 48 43 45 44  BILITOT 0.7 1.3* 1.2 1.0 1.1  PROT 8.3* 7.9 7.0 7.3 7.1  ALBUMIN 4.0 3.9 3.5 3.4* 3.4*   No results for input(s): "LIPASE", "AMYLASE" in the last 168 hours. No results for input(s): "AMMONIA" in the last 168 hours. Coagulation Profile: No results for input(s): "INR", "PROTIME" in the last 168 hours. Cardiac Enzymes: No results for input(s): "CKTOTAL", "CKMB", "CKMBINDEX", "TROPONINI" in the last 168 hours. BNP (last 3 results) No results for input(s): "PROBNP" in the last 8760 hours. HbA1C: No results for input(s): "HGBA1C" in the last 72 hours. CBG: No results for input(s): "GLUCAP" in the last 168 hours. Lipid Profile: No results for input(s): "CHOL", "HDL", "LDLCALC", "TRIG", "CHOLHDL", "LDLDIRECT" in the last 72 hours. Thyroid Function Tests: No results for input(s): "TSH", "T4TOTAL", "FREET4", "T3FREE", "THYROIDAB" in the last 72 hours. Anemia Panel: No results for input(s): "VITAMINB12", "FOLATE", "FERRITIN", "TIBC", "IRON", "RETICCTPCT" in the last 72 hours. Sepsis Labs: No results for input(s): "PROCALCITON", "LATICACIDVEN" in the last 168 hours.  Recent Results (from the past  240 hour(s))  Resp Panel by RT-PCR (Flu A&B, Covid) Anterior Nasal Swab     Status: None   Collection Time: 11/10/22  2:03 PM   Specimen: Anterior Nasal Swab  Result Value Ref Range Status   SARS Coronavirus 2 by RT PCR NEGATIVE NEGATIVE Final    Comment: (NOTE) SARS-CoV-2 target nucleic acids are NOT DETECTED.  The SARS-CoV-2 RNA is generally detectable in upper respiratory specimens during the acute phase of infection. The lowest concentration of SARS-CoV-2 viral copies this assay can detect is 138 copies/mL. A negative result does not preclude SARS-Cov-2 infection and should not be used as the sole basis for treatment or other patient management decisions. A negative result may occur with  improper specimen collection/handling, submission of specimen other than nasopharyngeal swab, presence of viral mutation(s) within the areas targeted by this assay, and inadequate number of viral copies(<138 copies/mL). A negative result must be combined with clinical observations, patient history, and epidemiological information. The expected result is Negative.  Fact Sheet for Patients:  EntrepreneurPulse.com.au  Fact Sheet  for Healthcare Providers:  IncredibleEmployment.be  This test is no t yet approved or cleared by the [place] and  has been authorized for detection and/or diagnosis of SARS-CoV-2 by FDA under an Emergency Use Authorization (EUA). This EUA will remain  in effect (meaning this test can be used) for the duration of the COVID-19 declaration under Section 564(b)(1) of the Act, 21 U.S.C.section 360bbb-3(b)(1), unless the authorization is terminated  or revoked sooner.       Influenza A by PCR NEGATIVE NEGATIVE Final   Influenza B by PCR NEGATIVE NEGATIVE Final    Comment: (NOTE) The Xpert Xpress SARS-CoV-2/FLU/RSV plus assay is intended as an aid in the diagnosis of influenza from Nasopharyngeal swab specimens and should not  be used as a sole basis for treatment. Nasal washings and aspirates are unacceptable for Xpert Xpress SARS-CoV-2/FLU/RSV testing.  Fact Sheet for Patients: EntrepreneurPulse.com.au  Fact Sheet for Healthcare Providers: IncredibleEmployment.be  This test is not yet approved or cleared by the [place] FDA and has been authorized for detection and/or diagnosis of SARS-CoV-2 by FDA under an Emergency Use Authorization (EUA). This EUA will remain in effect (meaning this test can be used) for the duration of the COVID-19 declaration under Section 564(b)(1) of the Act, 21 U.S.C. section 360bbb-3(b)(1), unless the authorization is terminated or revoked.  Performed at [facility], [place] [address], [place], [place] [address]   Aerobic/Anaerobic Culture w Gram Stain (surgical/deep wound)     Status: None   Collection Time: 11/12/22 12:16 PM   Specimen: Tissue  Result Value Ref Range Status   Specimen Description TISSUE  Final   Special Requests NONE  Final   Gram Stain NO WBC SEEN NO ORGANISMS SEEN   Final   Culture   Final    No growth aerobically or anaerobically. Performed at [facility], [place] [address] [place], [place] [address]    Report Status 11/17/2022 FINAL  Final  Acid Fast Smear (AFB)     Status: None   Collection Time: 11/12/22 12:16 PM   Specimen: Soft Tissue Mass Excision  Result Value Ref Range Status   AFB Specimen Processing Concentration  Final   Acid Fast Smear Negative  Final    Comment: (NOTE) Performed At: [facility] [place], [place] [id number] Rush Farmer MD NL:[id number]    Source (AFB) TISSUE  Final    Comment: Performed at [facility], [place] [address], [place], [place] [address]         Radiology Studies: MR BRAIN W WO CONTRAST  Result Date: 11/19/2022 CLINICAL DATA:  Lymphoma staging. EXAM: MRI HEAD WITHOUT AND WITH CONTRAST TECHNIQUE: Multiplanar, multiecho  pulse sequences of the brain and surrounding structures were obtained without and with intravenous contrast. CONTRAST:  42m GADAVIST GADOBUTROL 1 MMOL/ML IV SOLN COMPARISON:  None Available. FINDINGS: Brain: No acute infarction, hemorrhage, hydrocephalus, extra-axial collection or mass lesion. Vascular: Normal flow voids. Skull and upper cervical spine: Normal marrow signal. Sinuses/Orbits: Negative. Other: None. IMPRESSION: No evidence of intracranial metastatic disease. Electronically Signed   By: HMarin RobertsM.D.   On: 11/19/2022 16:17   NM PET Image Initial (PI) Skull Base To Thigh (F-18 FDG)  Result Date: 11/19/2022 CLINICAL DATA:  Initial treatment strategy for Burkitt's lymphoma. Initiation of chemotherapy same day. EXAM: NUCLEAR MEDICINE PET SKULL BASE TO THIGH TECHNIQUE: 14.3 mCi F-18 FDG was injected intravenously. Full-ring PET imaging was performed from the skull base to thigh after  the radiotracer. CT data was obtained and used for attenuation correction and anatomic localization. Fasting blood glucose: 14.3 mg/dl COMPARISON:  None Available. FINDINGS: Mediastinal blood pool activity: SUV max 2.7 Liver activity: SUV max 3.8 NECK: A mat of intensely hypermetabolic RIGHT enlarged cervical lymph nodes involve the RIGHT level 2, level 3 and RIGHT supraclavicular nodes. Nodal mass beneath the sternocleidomastoid muscle measures 27 mm on image 40 with SUV max equal 20.23 RIGHT neck mass superficial to the sternocleidomastoid muscle extends to the skin surface above the RIGHT clavicle measuring 5.1 cm with SUV max equal 33 point 1 (image 44). Incidental CT findings: None. CHEST: Hypermetabolic RIGHT jugular nodal conglomerateextends into the upper thoracic inlet and RIGHT upper mediastinum with SUV max equal 17 on image 50. No lower paratracheal adenopathy.  No hilar adenopathy. There are hypermetabolic RIGHT axillary nodes which are relatively small but intensely hypermetabolic. For example 8 mm node  on image 58/4 with SUV max equal 8.6. Incidental CT findings: No suspicious pulmonary nodules. Port in the anterior chest wall with tip in distal SVC. ABDOMEN/PELVIS: No abnormal hypermetabolic activity within the liver, pancreas, adrenal glands, or spleen. No hypermetabolic lymph nodes in the abdomen or pelvis. Spleen is normal volume and normal metabolic activity. Activity along the anterior LEFT chest wall related to port ejection is incidental/artifactual. Incidental CT findings: None. SKELETON: No abnormal marrow activity suggest lymphoma involvement. Incidental CT findings: None. IMPRESSION: 1. Enlarged intense hypermetabolic RIGHT cervical nodal mass extending from the level 2 nodal station to the RIGHT supraclavicular nodal station. Findings consistent high-grade lymphoma 2. Superficial RIGHT neck mass extends to the skin surface superficial to the RIGHT sternocleidomastoid muscle. Intense metabolic activity consistent with high-grade lymphoma. 3. Hypermetabolic small RIGHT axillary nodal disease. 4. No evidence of spleen involvement or solid organ involvement below the diaphragms. 5. No evidence of marrow involvement. Electronically Signed   By: Suzy Bouchard M.D.   On: 11/19/2022 16:02   IR IMAGING GUIDED PORT INSERTION  Result Date: 11/18/2022 INDICATION: 41 year old male with history of Burkitt lymphoma requiring central venous access for chemotherapy administration. EXAM: IMPLANTED PORT A CATH PLACEMENT WITH ULTRASOUND AND FLUOROSCOPIC GUIDANCE COMPARISON:  None Available. MEDICATIONS: None. ANESTHESIA/SEDATION: Moderate (conscious) sedation was employed during this procedure. A total of Versed 2 mg and Fentanyl 100 mcg was administered intravenously. Moderate Sedation Time: 22 minutes. The patient's level of consciousness and vital signs were monitored continuously by radiology nursing throughout the procedure under my direct supervision. CONTRAST:  None FLUOROSCOPY TIME:  Three mGy  COMPLICATIONS: None immediate. PROCEDURE: The procedure, risks, benefits, and alternatives were explained to the patient. Questions regarding the procedure were encouraged and answered. The patient understands and consents to the procedure. The left neck and chest were prepped with chlorhexidine in a sterile fashion, and a sterile drape was applied covering the operative field. Maximum barrier sterile technique with sterile gowns and gloves were used for the procedure. A timeout was performed prior to the initiation of the procedure. Ultrasound was used to examine the jugular vein which was compressible and free of internal echoes. A skin marker was used to demarcate the planned venotomy and port pocket incision sites. Local anesthesia was provided to these sites and the subcutaneous tunnel track with 1% lidocaine with 1:100,000 epinephrine. A small incision was created at the jugular access site and blunt dissection was performed of the subcutaneous tissues. Under ultrasound guidance, the jugular vein was accessed with a 21 ga micropuncture needle and an 0.018" wire was inserted to  the superior vena cava. Real-time ultrasound guidance was utilized for vascular access including the acquisition of a permanent ultrasound image documenting patency of the accessed vessel. A 5 Fr micopuncture set was then used, through which a 0.035" Rosen wire was passed under fluoroscopic guidance into the inferior vena cava. An 8 Fr dilator was then placed over the wire. A subcutaneous port pocket was then created along the upper chest wall utilizing a combination of sharp and blunt dissection. The pocket was irrigated with sterile saline, packed with gauze, and observed for hemorrhage. A single lumen "ISP" sized power injectable port was chosen for placement. The 8 Fr catheter was tunneled from the port pocket site to the venotomy incision. The port was placed in the pocket. The external catheter was trimmed to appropriate length.  The dilator was exchanged for an 8 Fr peel-away sheath under fluoroscopic guidance. The catheter was then placed through the sheath and the sheath was removed. Final catheter positioning was confirmed and documented with a fluoroscopic spot radiograph. The port was accessed with a Huber needle, aspirated, and flushed with heparinized saline. The deep dermal layer of the port pocket incision was closed with interrupted 3-0 Vicryl suture. The skin was opposed with a running subcuticular 4-0 Monocryl suture. Dermabond was then placed over the port pocket and neck incisions. The patient tolerated the procedure well without immediate post procedural complication. FINDINGS: After catheter placement, the tip lies within the superior cavoatrial junction. The catheter aspirates and flushes normally and is ready for immediate use. IMPRESSION: Successful placement of a power injectable Port-A-Cath via the left internal jugular vein. The catheter is ready for immediate use. Ruthann Cancer, MD [facility] Electronically Signed   By: Ruthann Cancer M.D.   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 EXAM: CT-GUIDED BONE MARROW BIOPSY AND ASPIRATION MEDICATIONS: None ANESTHESIA/SEDATION: Fentanyl 100 mcg IV; Versed 2 mg IV Sedation Time: 16 minutes; The patient was continuously monitored during the procedure by the interventional radiology nurse under my direct supervision. COMPLICATIONS: None immediate. PROCEDURE: Informed consent was obtained from the patient following an explanation of the procedure, risks, benefits and alternatives. The patient understands, agrees and consents for the procedure. All questions were addressed. A time out was performed prior to the initiation of the procedure. The patient was positioned prone and non-contrast localization CT was performed  of the pelvis to demonstrate the iliac marrow spaces. The operative site was prepped and draped in the usual sterile fashion. Under sterile conditions and local anesthesia, a 22 gauge spinal needle was utilized for procedural planning. Next, an 11 gauge coaxial bone biopsy needle was advanced into the right iliac marrow space. Needle position was confirmed with CT imaging. Initially, a bone marrow aspiration was performed. Next, a bone marrow biopsy was obtained with the 11 gauge outer bone marrow device. Samples were prepared with the cytotechnologist and deemed adequate. The needle was removed and superficial hemostasis was obtained with manual compression. A dressing was applied. The patient tolerated the procedure well without immediate post procedural complication. IMPRESSION: Successful CT guided right iliac bone marrow aspiration and core biopsy. Ruthann Cancer, MD [facility] Radiology Electronically Signed   By: Ruthann Cancer M.D.   On: 11/18/2022 11:34        Scheduled Meds:  allopurinol  100 mg Oral BID   bictegravir-emtricitabine-tenofovir AF  1  tablet Oral Daily   Chlorhexidine Gluconate Cloth  6 each Topical Daily   dexamethasone  6 mg Oral BID WC   DOXOrubicin (ADRIAMYCIN) 26 mg, etoposide (VEPESID) 128 mg, vinCRIStine (ONCOVIN) 1 mg in sodium chloride 0.9 % 1,000 mL chemo infusion   Intravenous Once   polyethylene glycol  17 g Oral Daily   senna-docusate  1 tablet Oral BID   sodium bicarbonate/sodium chloride   Mouth Rinse QID   sodium chloride flush  10-40 mL Intracatheter Q12H   sulfamethoxazole-trimethoprim  1 tablet Oral Daily   Continuous Infusions:   LOS: 10 days   Time spent= 35 mins    Kayci [place] Arsenio Loader, MD [facility]  If 7PM-7AM, please contact night-coverage  11/20/2022, 7:54 AM

## 2022-11-20 NOTE — Procedures (Signed)
CLINICAL DATA: [Burkitt lymphoma, chemotherapy injection.] EXAM: DIAGNOSTIC LUMBAR PUNCTURE UNDER FLUOROSCOPIC GUIDANCE COMPARISON: [None Available.] FLUOROSCOPY: Radiation Exposure Index (as provided by the fluoroscopic device): [14.9] [mGy Kerma] PROCEDURE:   Procedure was discussed with the patient including risks and benefits.   Patient's questions were answered.  Written informed consent for the procedure was obtained.  Timeout protocol followed.    [L3-4] disc space was localized under C-arm fluoroscopy.   Skin prepped and draped in usual sterile fashion.  Skin and soft tissues anesthetized with '[]'$  10 mL of 1% lidocaine.  Under sterile technique, [20 gauge] spinal needle advanced into spinal canal.  Clear colorless CSF was encountered.  7 mL of CSF was obtained for requested analysis.  Patient was then injected with 12 mg of methotrexate intrathecally.  Procedure tolerated well by patient without immediate complication.     IMPRESSION: [Successful lumbar puncture with intrathecal methotrexate administration.]  Lorin Picket, mD

## 2022-11-21 ENCOUNTER — Encounter: Payer: Self-pay | Admitting: Hematology

## 2022-11-21 DIAGNOSIS — R519 Headache, unspecified: Secondary | ICD-10-CM | POA: Diagnosis not present

## 2022-11-21 DIAGNOSIS — Z5111 Encounter for antineoplastic chemotherapy: Secondary | ICD-10-CM | POA: Diagnosis not present

## 2022-11-21 DIAGNOSIS — R110 Nausea: Secondary | ICD-10-CM

## 2022-11-21 DIAGNOSIS — C8378 Burkitt lymphoma, lymph nodes of multiple sites: Secondary | ICD-10-CM | POA: Diagnosis not present

## 2022-11-21 DIAGNOSIS — R222 Localized swelling, mass and lump, trunk: Secondary | ICD-10-CM | POA: Diagnosis not present

## 2022-11-21 DIAGNOSIS — K5903 Drug induced constipation: Secondary | ICD-10-CM

## 2022-11-21 LAB — CBC WITH DIFFERENTIAL/PLATELET
Abs Immature Granulocytes: 0.03 10*3/uL (ref 0.00–0.07)
Basophils Absolute: 0 10*3/uL (ref 0.0–0.1)
Basophils Relative: 0 %
Eosinophils Absolute: 0 10*3/uL (ref 0.0–0.5)
Eosinophils Relative: 0 %
HCT: 41.1 % (ref 39.0–52.0)
Hemoglobin: 13.3 g/dL (ref 13.0–17.0)
Immature Granulocytes: 1 %
Lymphocytes Relative: 21 %
Lymphs Abs: 1 10*3/uL (ref 0.7–4.0)
MCH: 26.7 pg (ref 26.0–34.0)
MCHC: 32.4 g/dL (ref 30.0–36.0)
MCV: 82.4 fL (ref 80.0–100.0)
Monocytes Absolute: 0.3 10*3/uL (ref 0.1–1.0)
Monocytes Relative: 6 %
Neutro Abs: 3.3 10*3/uL (ref 1.7–7.7)
Neutrophils Relative %: 72 %
Platelets: 237 10*3/uL (ref 150–400)
RBC: 4.99 MIL/uL (ref 4.22–5.81)
RDW: 14.1 % (ref 11.5–15.5)
WBC: 4.5 10*3/uL (ref 4.0–10.5)
nRBC: 0.7 % — ABNORMAL HIGH (ref 0.0–0.2)

## 2022-11-21 LAB — COMPREHENSIVE METABOLIC PANEL
ALT: 16 U/L (ref 0–44)
AST: 20 U/L (ref 15–41)
Albumin: 3.4 g/dL — ABNORMAL LOW (ref 3.5–5.0)
Alkaline Phosphatase: 44 U/L (ref 38–126)
Anion gap: 10 (ref 5–15)
BUN: 20 mg/dL (ref 6–20)
CO2: 23 mmol/L (ref 22–32)
Calcium: 8.8 mg/dL — ABNORMAL LOW (ref 8.9–10.3)
Chloride: 100 mmol/L (ref 98–111)
Creatinine, Ser: 0.91 mg/dL (ref 0.61–1.24)
GFR, Estimated: >60 mL/min (ref >60)
Glucose, Bld: 127 mg/dL — ABNORMAL HIGH (ref 70–99)
Potassium: 4.5 mmol/L (ref 3.5–5.1)
Sodium: 133 mmol/L — ABNORMAL LOW (ref 135–145)
Total Bilirubin: 1.7 mg/dL — ABNORMAL HIGH (ref 0.3–1.2)
Total Protein: 7.5 g/dL (ref 6.5–8.1)

## 2022-11-21 LAB — URIC ACID: Uric Acid, Serum: 5.6 mg/dL (ref 3.7–8.6)

## 2022-11-21 LAB — MAGNESIUM: Magnesium: 2.7 mg/dL — ABNORMAL HIGH (ref 1.7–2.4)

## 2022-11-21 LAB — PHOSPHORUS: Phosphorus: 4.6 mg/dL (ref 2.5–4.6)

## 2022-11-21 LAB — CYTOLOGY - NON PAP

## 2022-11-21 MED ORDER — SODIUM CHLORIDE 0.9 % IV SOLN
Freq: Once | INTRAVENOUS | Status: AC
  Administered 2022-11-21: 8 mg via INTRAVENOUS
  Filled 2022-11-21: qty 4

## 2022-11-21 MED ORDER — SODIUM CHLORIDE 0.9 % IV BOLUS
1000.0000 mL | Freq: Once | INTRAVENOUS | Status: AC
  Administered 2022-11-21: 1000 mL via INTRAVENOUS

## 2022-11-21 MED ORDER — ACETAMINOPHEN 500 MG PO TABS: 1000.0000 mg | ORAL_TABLET | Freq: Once | ORAL | Status: DC

## 2022-11-21 MED ORDER — SODIUM CHLORIDE 0.9 % IV SOLN
750.0000 mg/m2 | Freq: Once | INTRAVENOUS | Status: AC
  Administered 2022-11-22: 1920 mg via INTRAVENOUS
  Filled 2022-11-21: qty 96

## 2022-11-21 MED ORDER — VINCRISTINE SULFATE CHEMO INJECTION 1 MG/ML
Freq: Once | INTRAVENOUS | Status: AC
  Filled 2022-11-21: qty 13

## 2022-11-21 MED ORDER — MECLIZINE HCL 25 MG PO TABS
25.0000 mg | ORAL_TABLET | Freq: Three times a day (TID) | ORAL | Status: DC | PRN
  Administered 2022-11-21: 25 mg via ORAL
  Filled 2022-11-21 (×2): qty 1

## 2022-11-21 MED ORDER — LORAZEPAM 2 MG/ML IJ SOLN: 0.5000 mg | Freq: Three times a day (TID) | INTRAMUSCULAR | Status: DC | PRN

## 2022-11-21 MED ORDER — SODIUM CHLORIDE 0.9 % IV SOLN
Freq: Once | INTRAVENOUS | Status: AC
  Administered 2022-11-22: 36 mg via INTRAVENOUS
  Filled 2022-11-21: qty 8

## 2022-11-21 MED ORDER — PROCHLORPERAZINE EDISYLATE 10 MG/2ML IJ SOLN
10.0000 mg | Freq: Four times a day (QID) | INTRAMUSCULAR | Status: DC | PRN
  Administered 2022-11-21: 10 mg via INTRAVENOUS
  Filled 2022-11-21: qty 2

## 2022-11-21 MED ORDER — PROCHLORPERAZINE EDISYLATE 10 MG/2ML IJ SOLN: 10.0000 mg | Freq: Once | INTRAMUSCULAR | Status: DC

## 2022-11-21 NOTE — Progress Notes (Signed)
HEMATOLOGY/ONCOLOGY INPATIENT PROGRESS NOTE  Date of Service: 11/20/2022  Inpatient Attending: .Damita Lack, MD   SUBJECTIVE  Patient was seen in medical oncology follow-up.  Receiving cycle 1 day 4 of dose adjusted EPOCH-R chemotherapy.  He had a puncture diagnostic as well as received intrathecal methotrexate for CNS prophylaxis. Having mild headache and nausea this morning.  Recommended bedrest hydration coffee intake to help with any spinal headaches. One-time dose of Tylenol and Compazine. Had a small bowel movement this morning.  Recommended not straining for bowel movements s/p recent spinal tap. Discussed bone marrow biopsy results which showed no overt evidence of involvement by lymphoma. Spinal tap results pending.   OBJECTIVE:  NAD  PHYSICAL EXAMINATION: . Vitals:   11/20/22 0441 11/20/22 1354 11/20/22 1948 11/21/22 0517  BP: 122/69 117/60 110/60 109/66  Pulse: (!) 58 71 75 61  Resp: _0 Temp: 98 F (36.7 C) 98.1 F (36.7 C) 98.4 F (36.9 C) 98.4 F (36.9 C)  TempSrc: Oral Oral Oral Oral  SpO2: 99% 97% 98% 98%  Weight:      Height:       Filed Weights   11/11/22 0730 11/11/22 1755  Weight: 272 lb 0.8 oz (123.4 kg) 281 lb 8.4 oz (127.7 kg)   .Body mass index is 37.14 kg/m.    MEDICAL HISTORY:  Past Medical History:  Diagnosis Date   Anal fissure    HIV infection ([place])    Internal hemorrhoids    Obesity    Rectal ulcer     SURGICAL HISTORY: Past Surgical History:  Procedure Laterality Date   IR IMAGING GUIDED PORT INSERTION  11/18/2022    SOCIAL HISTORY: Social History   Socioeconomic History   Marital status: Single    Spouse name: Not on file   Number of children: Not on file   Years of education: Not on file   Highest education level: Not on file  Occupational History   Not on file  Tobacco Use   Smoking status: Never   Smokeless tobacco: Never  Vaping Use   Vaping Use: Never used  Substance and Sexual  Activity   Alcohol use: No   Drug use: No   Sexual activity: Yes    Comment: given condoms  Other Topics Concern   Not on file  Social History Narrative   Not on file   Social Determinants of Health   Financial Resource Strain: Low Risk  (10/24/2022)   Overall Financial Resource Strain (CARDIA)    Difficulty of Paying Living Expenses: Not hard at all  Food Insecurity: No Food Insecurity (11/11/2022)   Hunger Vital Sign    Worried About Running Out of Food in the Last Year: Never true    Ran Out of Food in the Last Year: Never true  Transportation Needs: No Transportation Needs (11/11/2022)   PRAPARE - Hydrologist (Medical): No    Lack of Transportation (Non-Medical): No  Physical Activity: Sufficiently Active (10/24/2022)   Exercise Vital Sign    Days of Exercise per Week: 3 days    Minutes of Exercise per Session: 60 min  Recent Concern: Physical Activity - Insufficiently Active (10/24/2022)   Exercise Vital Sign    Days of Exercise per Week: 3 days    Minutes of Exercise per Session: 20 min  Stress: No Stress Concern Present (10/24/2022)   [place]    Feeling of Stress :  Not at all  Social Connections: Unknown (10/24/2022)   Social Connection and Isolation Panel [NHANES]    Frequency of Communication with Friends and Family: Twice a week    Frequency of Social Gatherings with Friends and Family: Twice a week    Attends Religious Services: 1 to 4 times per year    Active Member of [employer] or Organizations: No    Attends Archivist Meetings: 1 to 4 times per year    Marital Status: Patient refused  Intimate Partner Violence: Not At Risk (11/11/2022)   Humiliation, Afraid, Rape, and Kick questionnaire    Fear of Current or Ex-Partner: No    Emotionally Abused: No    Physically Abused: No    Sexually Abused: No    FAMILY HISTORY: Family History  Problem Relation Age of  Onset   Hypertension Father    Throat cancer Father    Heart attack Brother    Colon cancer Neg Hx    Esophageal cancer Neg Hx    Stomach cancer Neg Hx     ALLERGIES:  has No Known Allergies.  MEDICATIONS:  Scheduled Meds:  allopurinol  100 mg Oral BID   bictegravir-emtricitabine-tenofovir AF  1 tablet Oral Daily   Chlorhexidine Gluconate Cloth  6 each Topical Daily   dexamethasone  6 mg Oral BID WC   DOXOrubicin (ADRIAMYCIN) 26 mg, etoposide (VEPESID) 128 mg, vinCRIStine (ONCOVIN) 1 mg in sodium chloride 0.9 % 1,000 mL chemo infusion   Intravenous Once   feeding supplement  237 mL Oral BID BM   lidocaine  5 mL Other Once   polyethylene glycol  17 g Oral Daily   senna-docusate  1 tablet Oral BID   sodium bicarbonate/sodium chloride   Mouth Rinse QID   sodium chloride flush  10-40 mL Intracatheter Q12H   [START ON 11/22/2022] sulfamethoxazole-trimethoprim  1 tablet Oral Daily   Continuous Infusions:   PRN Meds:.acetaminophen **OR** acetaminophen, bisacodyl, guaiFENesin, hydrALAZINE, iohexol, ipratropium-albuterol, magnesium hydroxide, meclizine, metoprolol tartrate, ondansetron **OR** ondansetron (ZOFRAN) IV, oxyCODONE, sodium chloride flush, sodium phosphate, traMADol, traZODone  REVIEW OF SYSTEMS:   10 Point review of Systems was done is negative except as noted above.  LABORATORY DATA:  I have reviewed the data as listed  .    Latest Ref Rng & Units 11/21/2022    4:55 AM 11/20/2022    5:52 AM 11/19/2022    5:45 AM  CBC  WBC 4.0 - 10.5 K/uL 4.5  3.7  7.6   Hemoglobin 13.0 - 17.0 g/dL 13.3  13.3  12.7   Hematocrit 39.0 - 52.0 % 41.1  41.7  39.7   Platelets 150 - 400 K/uL 237  230  244    .    Latest Ref Rng & Units 11/21/2022    4:55 AM 11/20/2022    5:52 AM 11/19/2022    5:45 AM  CMP  Glucose 70 - 99 mg/dL 127  122  109   BUN 6 - 20 mg/dL _0 Creatinine 0.61 - 1.24 mg/dL 0.91  0.95  0.93   Sodium 135 - 145 mmol/L 133  129  132   Potassium 3.5 - 5.1  mmol/L 4.5  4.7  4.4   Chloride 98 - 111 mmol/L 100  98  101   CO2 22 - 32 mmol/L _1 Calcium 8.9 - 10.3 mg/dL 8.8  8.6  8.9   Total Protein 6.5 - 8.1 g/dL 7.5  7.1  7.3   Total Bilirubin 0.3 - 1.2 mg/dL 1.7  1.1  1.0   Alkaline Phos 38 - 126 U/L 44  44  45   AST 15 - 41 U/L _0 ALT 0 - 44 U/L _1 Uric acid 5.6 Magnesium 2.7 Phosphorus 4.6   Surgical Pathology CASE: [id number] PATIENT: Rachel Moulds Bone Marrow Report     Clinical History: Burkitt's lymphoma for staging  ([place])  ([place])     DIAGNOSIS:  BONE MARROW, ASPIRATE, CLOT, CORE: -Normocellular bone marrow for age with trilineage hematopoiesis -See comment  PERIPHERAL BLOOD: -Mild normocytic-normochromic anemia -Mild neutrophilic left shift  COMMENT:  There is no definitive or diagnostic evidence of a B-cell lymphoproliferative process in this material.  Correlation with cytogenetic studies is recommended.    RADIOGRAPHIC STUDIES: I have personally reviewed the radiological images as listed and agreed with the findings in the report. DG Fluoro Guide Spinal/SI Jt Inj Left  Result Date: 11/20/2022 CLINICAL DATA:  Burkitt lymphoma, chemotherapy injection. EXAM: DIAGNOSTIC LUMBAR PUNCTURE UNDER FLUOROSCOPIC GUIDANCE COMPARISON:  None Available. FLUOROSCOPY: Radiation Exposure Index (as provided by the fluoroscopic device): 14.9 mGy Kerma PROCEDURE: Procedure was discussed with the patient including risks and benefits. Patient's questions were answered. Written informed consent for the procedure was obtained. Timeout protocol followed. L3-4 disc space was localized under C-arm fluoroscopy. Skin prepped and draped in usual sterile fashion. Skin and soft tissues anesthetized with  10 mL of 1% lidocaine. Under sterile technique, 20 gauge spinal needle advanced into spinal canal. Clear colorless CSF was encountered. 7 mL of CSF was obtained for requested analysis. Patient was then injected with  12 mg of methotrexate intrathecally. Procedure tolerated well by patient without immediate complication. IMPRESSION: Successful lumbar puncture with intrathecal methotrexate administration. Electronically Signed   By: Lorin Picket M.D.   On: 11/20/2022 14:08   DG FL GUIDED LUMBAR PUNCTURE  Result Date: 11/20/2022 Lorin Picket, MD     11/20/2022  2:08 PM CLINICAL DATA: [Burkitt lymphoma, chemotherapy injection.] EXAM: DIAGNOSTIC LUMBAR PUNCTURE UNDER FLUOROSCOPIC GUIDANCE COMPARISON: [None Available.] FLUOROSCOPY: Radiation Exposure Index (as provided by the fluoroscopic device): [14.9] [mGy Kerma] PROCEDURE:   Procedure was discussed with the patient including risks and benefits.  Patient's questions were answered. Written informed consent for the procedure was obtained. Timeout protocol followed.  [L3-4] disc space was localized under C-arm fluoroscopy.  Skin prepped and draped in usual sterile fashion. Skin and soft tissues anesthetized with _2  10 mL of 1% lidocaine. Under sterile technique, [20 gauge] spinal needle advanced into spinal canal. Clear colorless CSF was encountered. 7 mL of CSF was obtained for requested analysis. Patient was then injected with 12 mg of methotrexate intrathecally. Procedure tolerated well by patient without immediate complication.   IMPRESSION: [Successful lumbar puncture with intrathecal methotrexate administration.] Lorin Picket, mD  MR BRAIN W WO CONTRAST  Result Date: 11/19/2022 CLINICAL DATA:  Lymphoma staging. EXAM: MRI HEAD WITHOUT AND WITH CONTRAST TECHNIQUE: Multiplanar, multiecho pulse sequences of the brain and surrounding structures were obtained without and with intravenous contrast. CONTRAST:  69m GADAVIST GADOBUTROL 1 MMOL/ML IV SOLN COMPARISON:  None Available. FINDINGS: Brain: No acute infarction, hemorrhage, hydrocephalus, extra-axial collection or mass lesion. Vascular: Normal flow voids. Skull and upper cervical spine: Normal marrow signal.  Sinuses/Orbits: Negative. Other: None. IMPRESSION: No evidence of intracranial metastatic disease. Electronically Signed   By: HMarin RobertsM.D.   On: 11/19/2022 16:17  NM PET Image Initial (PI) Skull Base To Thigh (F-18 FDG)  Result Date: 11/19/2022 CLINICAL DATA:  Initial treatment strategy for Burkitt's lymphoma. Initiation of chemotherapy same day. EXAM: NUCLEAR MEDICINE PET SKULL BASE TO THIGH TECHNIQUE: 14.3 mCi F-18 FDG was injected intravenously. Full-ring PET imaging was performed from the skull base to thigh after the radiotracer. CT data was obtained and used for attenuation correction and anatomic localization. Fasting blood glucose: 14.3 mg/dl COMPARISON:  None Available. FINDINGS: Mediastinal blood pool activity: SUV max 2.7 Liver activity: SUV max 3.8 NECK: A mat of intensely hypermetabolic RIGHT enlarged cervical lymph nodes involve the RIGHT level 2, level 3 and RIGHT supraclavicular nodes. Nodal mass beneath the sternocleidomastoid muscle measures 27 mm on image 40 with SUV max equal 20.23 RIGHT neck mass superficial to the sternocleidomastoid muscle extends to the skin surface above the RIGHT clavicle measuring 5.1 cm with SUV max equal 33 point 1 (image 44). Incidental CT findings: None. CHEST: Hypermetabolic RIGHT jugular nodal conglomerateextends into the upper thoracic inlet and RIGHT upper mediastinum with SUV max equal 17 on image 50. No lower paratracheal adenopathy.  No hilar adenopathy. There are hypermetabolic RIGHT axillary nodes which are relatively small but intensely hypermetabolic. For example 8 mm node on image 58/4 with SUV max equal 8.6. Incidental CT findings: No suspicious pulmonary nodules. Port in the anterior chest wall with tip in distal SVC. ABDOMEN/PELVIS: No abnormal hypermetabolic activity within the liver, pancreas, adrenal glands, or spleen. No hypermetabolic lymph nodes in the abdomen or pelvis. Spleen is normal volume and normal metabolic activity. Activity  along the anterior LEFT chest wall related to port ejection is incidental/artifactual. Incidental CT findings: None. SKELETON: No abnormal marrow activity suggest lymphoma involvement. Incidental CT findings: None. IMPRESSION: 1. Enlarged intense hypermetabolic RIGHT cervical nodal mass extending from the level 2 nodal station to the RIGHT supraclavicular nodal station. Findings consistent high-grade lymphoma 2. Superficial RIGHT neck mass extends to the skin surface superficial to the RIGHT sternocleidomastoid muscle. Intense metabolic activity consistent with high-grade lymphoma. 3. Hypermetabolic small RIGHT axillary nodal disease. 4. No evidence of spleen involvement or solid organ involvement below the diaphragms. 5. No evidence of marrow involvement. Electronically Signed   By: Suzy Bouchard M.D.   On: 11/19/2022 16:02   IR IMAGING GUIDED PORT INSERTION  Result Date: 11/18/2022 INDICATION: 41 year old male with history of Burkitt lymphoma requiring central venous access for chemotherapy administration. EXAM: IMPLANTED PORT A CATH PLACEMENT WITH ULTRASOUND AND FLUOROSCOPIC GUIDANCE COMPARISON:  None Available. MEDICATIONS: None. ANESTHESIA/SEDATION: Moderate (conscious) sedation was employed during this procedure. A total of Versed 2 mg and Fentanyl 100 mcg was administered intravenously. Moderate Sedation Time: 22 minutes. The patient's level of consciousness and vital signs were monitored continuously by radiology nursing throughout the procedure under my direct supervision. CONTRAST:  None FLUOROSCOPY TIME:  Three mGy COMPLICATIONS: None immediate. PROCEDURE: The procedure, risks, benefits, and alternatives were explained to the patient. Questions regarding the procedure were encouraged and answered. The patient understands and consents to the procedure. The left neck and chest were prepped with chlorhexidine in a sterile fashion, and a sterile drape was applied covering the operative field. Maximum  barrier sterile technique with sterile gowns and gloves were used for the procedure. A timeout was performed prior to the initiation of the procedure. Ultrasound was used to examine the jugular vein which was compressible and free of internal echoes. A skin marker was used to demarcate the planned venotomy and port pocket incision sites. Local  anesthesia was provided to these sites and the subcutaneous tunnel track with 1% lidocaine with 1:100,000 epinephrine. A small incision was created at the jugular access site and blunt dissection was performed of the subcutaneous tissues. Under ultrasound guidance, the jugular vein was accessed with a 21 ga micropuncture needle and an 0.018" wire was inserted to the superior vena cava. Real-time ultrasound guidance was utilized for vascular access including the acquisition of a permanent ultrasound image documenting patency of the accessed vessel. A 5 Fr micopuncture set was then used, through which a 0.035" Rosen wire was passed under fluoroscopic guidance into the inferior vena cava. An 8 Fr dilator was then placed over the wire. A subcutaneous port pocket was then created along the upper chest wall utilizing a combination of sharp and blunt dissection. The pocket was irrigated with sterile saline, packed with gauze, and observed for hemorrhage. A single lumen "ISP" sized power injectable port was chosen for placement. The 8 Fr catheter was tunneled from the port pocket site to the venotomy incision. The port was placed in the pocket. The external catheter was trimmed to appropriate length. The dilator was exchanged for an 8 Fr peel-away sheath under fluoroscopic guidance. The catheter was then placed through the sheath and the sheath was removed. Final catheter positioning was confirmed and documented with a fluoroscopic spot radiograph. The port was accessed with a Huber needle, aspirated, and flushed with heparinized saline. The deep dermal layer of the port pocket  incision was closed with interrupted 3-0 Vicryl suture. The skin was opposed with a running subcuticular 4-0 Monocryl suture. Dermabond was then placed over the port pocket and neck incisions. The patient tolerated the procedure well without immediate post procedural complication. FINDINGS: After catheter placement, the tip lies within the superior cavoatrial junction. The catheter aspirates and flushes normally and is ready for immediate use. IMPRESSION: Successful placement of a power injectable Port-A-Cath via the left internal jugular vein. The catheter is ready for immediate use. Ruthann Cancer, MD [facility] Radiology Electronically Signed   By: Ruthann Cancer M.D.   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 EXAM: CT-GUIDED BONE MARROW BIOPSY AND ASPIRATION MEDICATIONS: None ANESTHESIA/SEDATION: Fentanyl 100 mcg IV; Versed 2 mg IV Sedation Time: 16 minutes; The patient was continuously monitored during the procedure by the interventional radiology nurse under my direct supervision. COMPLICATIONS: None immediate. PROCEDURE: Informed consent was obtained from the patient following an explanation of the procedure, risks, benefits and alternatives. The patient understands, agrees and consents for the procedure. All questions were addressed. A time out was performed prior to the initiation of the procedure. The patient was positioned prone and non-contrast localization CT was performed of the pelvis to demonstrate the iliac marrow spaces. The operative site was prepped and draped in the usual sterile fashion. Under sterile conditions and local anesthesia, a 22 gauge spinal needle was utilized for procedural planning. Next, an 11 gauge coaxial bone biopsy needle was advanced into the right iliac marrow space. Needle position was confirmed with CT imaging.  Initially, a bone marrow aspiration was performed. Next, a bone marrow biopsy was obtained with the 11 gauge outer bone marrow device. Samples were prepared with the cytotechnologist and deemed adequate. The needle was removed and superficial hemostasis was obtained with manual compression. A dressing was applied. The patient tolerated the procedure well without immediate post procedural complication. IMPRESSION:  Successful CT guided right iliac bone marrow aspiration and core biopsy. Ruthann Cancer, MD [name] Radiology Electronically Signed   By: Ruthann Cancer M.D.   On: 11/18/2022 11:34   [place] EKG SITE RITE  Result Date: 11/15/2022 If Site Rite image not attached, placement could not be confirmed due to current cardiac rhythm.  ECHOCARDIOGRAM COMPLETE  Result Date: 11/14/2022    ECHOCARDIOGRAM REPORT   Patient Name:   XAYNE BRUMBAUGH Date of Exam: 11/14/2022 Medical Rec #:  [id number]      Height:       73.0 in Accession #:    [id number]     Weight:       281.5 lb Date of Birth:  1981-03-16       BSA:          2.488 m Patient Age:    78 years       BP:           129/79 mmHg Patient Gender: M              HR:           75 bpm. Exam Location:  Inpatient Procedure: 2D Echo, Color Doppler, Cardiac Doppler and Intracardiac            Opacification Agent Indications:    Chemo Z09  History:        Patient has no prior history of Echocardiogram examinations.                 Chest Mass; Signs/Symptoms:Shortness of Breath.  Sonographer:    Greer Pickerel Referring Phys: [id number] Brunetta Genera  Sonographer Comments: Image acquisition challenging due to patient body habitus and Image acquisition challenging due to respiratory motion. Global longitudinal strain was attempted. IMPRESSIONS  1. Left ventricular ejection fraction, by estimation, is 60 to 65%. The left ventricle has normal function. The left ventricle has no regional wall motion abnormalities. There is mild  left ventricular hypertrophy. Left ventricular diastolic parameters were normal.  2. Right ventricular systolic function is normal. The right ventricular size is normal. Tricuspid regurgitation signal is inadequate for assessing PA pressure.  3. The mitral valve is normal in structure. No evidence of mitral valve regurgitation.  4. The aortic valve is tricuspid. Aortic valve regurgitation is not visualized.  5. The inferior vena cava is normal in size with greater than 50% respiratory variability, suggesting right atrial pressure of 3 mmHg. Comparison(s): No prior Echocardiogram. FINDINGS  Left Ventricle: Left ventricular ejection fraction, by estimation, is 60 to 65%. The left ventricle has normal function. The left ventricle has no regional wall motion abnormalities. Definity contrast agent was given IV to delineate the left ventricular  endocardial borders. The left ventricular internal cavity size was normal in size. There is mild left ventricular hypertrophy. Left ventricular diastolic parameters were normal. Right Ventricle: The right ventricular size is normal. No increase in right ventricular wall thickness. Right ventricular systolic function is normal. Tricuspid regurgitation signal is inadequate for assessing PA pressure. Left Atrium: Left atrial size was normal in size. Right Atrium: Right atrial size was normal in size. Pericardium: There is no evidence of pericardial effusion. Mitral Valve: The mitral valve is normal in structure. No evidence of mitral valve regurgitation. Tricuspid Valve: The tricuspid valve is normal in structure. Tricuspid valve regurgitation is not demonstrated. Aortic Valve: The aortic valve is tricuspid. Aortic valve regurgitation is not visualized. Pulmonic Valve: Pulmonic valve regurgitation is not visualized.  Aorta: The aortic root and ascending aorta are structurally normal, with no evidence of dilitation. Venous: The inferior vena cava is normal in size with greater than 50%  respiratory variability, suggesting right atrial pressure of 3 mmHg. IAS/Shunts: No atrial level shunt detected by color flow Doppler.  LEFT VENTRICLE PLAX 2D LVIDd:         3.60 cm   Diastology LVIDs:         2.40 cm   LV e' medial:    6.85 cm/s LV PW:         1.40 cm   LV E/e' medial:  13.3 LV IVS:        0.80 cm   LV e' lateral:   12.70 cm/s LVOT diam:     2.10 cm   LV E/e' lateral: 7.1 LV SV:         77 LV SV Index:   31 LVOT Area:     3.46 cm  RIGHT VENTRICLE RV S prime:     17.70 cm/s TAPSE (M-mode): 2.8 cm LEFT ATRIUM           Index        RIGHT ATRIUM           Index LA diam:      3.10 cm 1.25 cm/m   RA Area:     16.80 cm LA Vol (A2C): 46.4 ml 18.65 ml/m  RA Volume:   40.80 ml  16.40 ml/m LA Vol (A4C): 36.6 ml 14.71 ml/m  AORTIC VALVE LVOT Vmax:   134.00 cm/s LVOT Vmean:  81.400 cm/s LVOT VTI:    0.223 m  AORTA Ao Root diam: 3.80 cm Ao Asc diam:  3.20 cm MITRAL VALVE MV Area (PHT): 3.63 cm    SHUNTS MV Decel Time: 209 msec    Systemic VTI:  0.22 m MR Peak grad: 3.7 mmHg     Systemic Diam: 2.10 cm MR Vmax:      96.60 cm/s MV E velocity: 90.80 cm/s MV A velocity: 76.70 cm/s MV E/A ratio:  1.18 Landscape architect signed by Phineas Inches Signature Date/Time: 11/14/2022/4:58:35 PM    Final    [place] CORE BIOPSY (SOFT TISSUE)  Result Date: 11/12/2022 INDICATION: 41 year old gentleman with history of lymphoma presents to IR for biopsy of right neck mass EXAM: Ultrasound-guided biopsy of right neck mass MEDICATIONS: None. ANESTHESIA/SEDATION: Moderate (conscious) sedation was employed during this procedure. A total of Versed 2.5 mg and Fentanyl 75 mcg was administered intravenously. Moderate Sedation Time: 10 minutes. The patient's level of consciousness and vital signs were monitored continuously by radiology nursing throughout the procedure under my direct supervision. COMPLICATIONS: None immediate. PROCEDURE: Informed written consent was obtained from the patient after a thorough discussion of the  procedural risks, benefits and alternatives. All questions were addressed. Maximal Sterile Barrier Technique was utilized including caps, mask, sterile gowns, sterile gloves, sterile drape, hand hygiene and skin antiseptic. A timeout was performed prior to the initiation of the procedure. Patient position supine on the ultrasound table. Right neck skin prepped and draped in usual sterile fashion. Following local lidocaine administration, 5- 18 gauge cores were obtained from the right neck mass utilizing continuous ultrasound guidance. Two cores were sent for Gram stain and culture in sterile saline. Three cores were sent for histologic analysis in formalin. Needle removed and hemostasis achieved with 5 minutes of manual compression. Post procedure ultrasound images showed no evidence of significant hemorrhage. IMPRESSION: Ultrasound-guided biopsy of right neck mass. Electronically  Signed   By: Miachel Roux M.D.   On: 11/12/2022 17:17   CT Soft Tissue Neck W Contrast  Result Date: 11/10/2022 CLINICAL DATA:  Neck mass with history of HIV. EXAM: CT NECK WITH CONTRAST TECHNIQUE: Multidetector CT imaging of the neck was performed using the standard protocol following the bolus administration of intravenous contrast. RADIATION DOSE REDUCTION: This exam was performed according to the departmental dose-optimization program which includes automated exposure control, adjustment of the mA and/or kV according to patient size and/or use of iterative reconstruction technique. CONTRAST:  126m OMNIPAQUE IOHEXOL 350 MG/ML SOLN COMPARISON:  CT neck 6 days ago. FINDINGS: Pharynx and larynx: The nasal cavity and nasopharynx are unremarkable. The oral cavity and oropharynx are unremarkable. The parapharyngeal spaces are clear. The hypopharynx and larynx are unremarkable. The vocal folds are normal in appearance. There is no retropharyngeal fluid collection.  The airway is patent. Salivary glands: The parotid and submandibular  glands are unremarkable. Thyroid: Unremarkable. Lymph nodes: There is enlarged centrally necrotic right level I lymph node measuring up to 2.1 cm in short axis, similar to the CT from 6 days ago. The large infiltrative centrally necrotic soft tissue mass throughout the right neck measuring up to approximately 8.1 cm TV x 5.9 cm AP by 7.4 cm cc is also not significantly changed in size. Inferior extent into the upper chest anterior to the thyroid with centrally necrotic appearing tissue measuring 4.9 cm x 5.4 cm is unchanged when measured again using similar technique. Overlying skin thickening is similar. The mass is inseparable from the sternocleidomastoid muscle. There are numerous additional prominent right-sided cervical chain lymph nodes measuring up to 1.0 cm at level IIA (3-38), and 1.1 cm in short axis in the posterior triangle (3-43). Vascular: The major vasculature of the neck is unremarkable. Limited intracranial: The imaged portions of the posterior fossa are unremarkable. Visualized orbits: Not included within the field of view. Mastoids and visualized paranasal sinuses: Clear. Skeleton: There is no acute osseous abnormality or suspicious osseous lesion. Upper chest: Assessed on the separately dictated CT chest. Other: None. IMPRESSION: No significant interval change since the CT neck from 6 days prior. Unchanged infiltrative soft tissue mass in the right neck extending to the upper chest anterior to the clavicle with central necrosis and surrounding lymphadenopathy. Differential again includes malignancy (sarcoma, lymphoma), or infection (including TB. Sampling is recommended. Electronically Signed   By: PValetta MoleM.D.   On: 11/10/2022 16:27   CT CHEST ABDOMEN PELVIS W CONTRAST  Result Date: 11/10/2022 CLINICAL DATA:  Lymphoma, neck mass, metastatic disease evaluation * Tracking Code: BO * EXAM: CT CHEST, ABDOMEN, AND PELVIS WITH CONTRAST TECHNIQUE: Multidetector CT imaging of the chest,  abdomen and pelvis was performed following the standard protocol during bolus administration of intravenous contrast. RADIATION DOSE REDUCTION: This exam was performed according to the departmental dose-optimization program which includes automated exposure control, adjustment of the mA and/or kV according to patient size and/or use of iterative reconstruction technique. CONTRAST:  1087mOMNIPAQUE IOHEXOL 350 MG/ML SOLN COMPARISON:  CT neck, 11/04/2022, CT pelvis, 02/09/2017 FINDINGS: CT CHEST FINDINGS Cardiovascular: No significant vascular findings. Normal heart size. No pericardial effusion. Mediastinum/Nodes: Enlarged right lower cervical, supraclavicular, and bilateral axillary lymph nodes, largest right axillary nodes measuring up to 2.2 x 1.7 cm (series 3, image 18). Diffuse fat stranding throughout the superior mediastinum (series 3, image 19), thyroid gland, trachea, and esophagus demonstrate no significant findings. Lungs/Pleura: Lungs are clear. No pleural effusion or pneumothorax. Musculoskeletal:  Partially imaged, heterogeneous mass centered in the anterior right neck, incompletely imaged and better assessed by simultaneous dedicated examination of the neck (series 3, image 1). No acute osseous findings. CT ABDOMEN PELVIS FINDINGS Hepatobiliary: No solid liver abnormality is seen. No gallstones, gallbladder wall thickening, or biliary dilatation. Pancreas: Unremarkable. No pancreatic ductal dilatation or surrounding inflammatory changes. Spleen: Normal in size without significant abnormality. Adrenals/Urinary Tract: Adrenal glands are unremarkable. Kidneys are normal, without renal calculi, solid lesion, or hydronephrosis. Bladder is unremarkable. Stomach/Bowel: Stomach is within normal limits. Appendix appears normal. No evidence of bowel wall thickening, distention, or inflammatory changes. Vascular/Lymphatic: No significant vascular findings are present. Prominent subcentimeter retroperitoneal and  iliac lymph nodes, for example a right external iliac node measuring 1.1 x 0.8 cm (series 3, image 102). Reproductive: No mass or other abnormality. Other: No abdominal wall hernia or abnormality. No ascites. Diffuse retroperitoneal fat stranding (series 3, image 80). Musculoskeletal: No acute osseous findings. IMPRESSION: 1. Partially imaged, heterogeneous mass centered in the anterior right neck, incompletely imaged and better assessed by simultaneous dedicated examination of the neck. As previously reported, general differential considerations include both malignancy and infectious process such as scrofula. 2. Enlarged right lower cervical, supraclavicular, and bilateral axillary lymph nodes, which may be reactive although are generally concerning for metastatic disease. 3. Prominent subcentimeter retroperitoneal and iliac lymph nodes, nonspecific although as above concerning for additional nodal metastatic disease. 4. Diffuse fat stranding throughout the superior mediastinum, of uncertain nature, possibly reflecting mediastinal infectious involvement or alternately vascular/lymphatic congestion. Prior thoracic radiotherapy could likewise have this appearance. 5. Diffuse retroperitoneal fat stranding, as above of uncertain significance, possibly infectious or inflammatory, for example incidental and related to pancreatitis, or as above perhaps related to prior abdominal radiotherapy. 6. No mass or evidence of organ metastatic disease in the abdomen or pelvis. Electronically Signed   By: Delanna Ahmadi M.D.   On: 11/10/2022 16:26   DG Chest 2 View  Result Date: 11/10/2022 CLINICAL DATA:  Dyspnea EXAM: CHEST - 2 VIEW COMPARISON:  06/27/2016 FINDINGS: Small areas of consolidation or volume loss identified right middle lobe and lingula. No pneumothorax or pleural effusion. Normal pulmonary vasculature. Unremarkable cardiac silhouette. Unremarkable osseous structures. IMPRESSION: Medial bibasilar subsegmental  atelectasis or consolidation. Electronically Signed   By: Sammie Bench M.D.   On: 11/10/2022 14:53   CT SOFT TISSUE NECK W CONTRAST  Result Date: 11/05/2022 CLINICAL DATA:  Neck mass. EXAM: CT NECK WITH CONTRAST TECHNIQUE: Multidetector CT imaging of the neck was performed using the standard protocol following the bolus administration of intravenous contrast. RADIATION DOSE REDUCTION: This exam was performed according to the departmental dose-optimization program which includes automated exposure control, adjustment of the mA and/or kV according to patient size and/or use of iterative reconstruction technique. CONTRAST:  58m OMNIPAQUE IOHEXOL 350 MG/ML SOLN COMPARISON:  CT Neck 07/23/22 FINDINGS: Pharynx and larynx: Normal. No mass or swelling. Salivary glands: No inflammation, mass, or stone. Thyroid: Normal. Lymph nodes: There are prominent lymph nodes, for example a 1.2 cm level 1 a lymph node, unchanged from prior exam. There is also a prominent 8 mm right level 3 lymph, slightly increased in size from prior exam. Along the inferior aspect of the right submandibular gland there is a 2.8 x 1.8 cm lesion with central hypodensity (series 3, image 56) which could represent a centrally necrotic lymph node. Vascular: There is mass effect on the right internal jugular vein (series 3, image 68). Limited intracranial: Negative. Visualized orbits: Negative. Mastoids  and visualized paranasal sinuses: Clear. Skeleton: No acute or aggressive process. Upper chest: See below.  No focal pulmonary nodule is visualized. Other: There is marked interval worsening in the degree soft tissue abnormality seen in the right neck compared to prior exam dated 07/23/2022. There is now a large soft tissue mass along the anterior chest wall, adjacent to the sternoclavicular joint on the right measuring 4.2 x 3 4 x 5.6 cm (series 3, image 5). There is infiltrative soft tissue surrounding this mass that extends superiorly along the  supraclavicular region to the level of the hyoid bone and submandibular region (series 3, image 64). Inferiorly this mass likely now also extends into the mediastinum where there is soft tissue stranding (series 3, image 115). Infiltrative soft tissues also seen along the left supraclavicular region (series 3, image 77), but to a lesser degree than the right. Portions of this mass appear to have a cutaneous component, for example along the right aspect of the chin (series 3, image 59), as well as the portion adjacent to the right sternoclavicular joint (series 3, image 79). IMPRESSION: 1. Marked interval worsening in the degree of soft tissue abnormality seen in the right neck compared to prior exam dated 07/23/2022. There is now a multi spatial infiltrative soft tissue mass involving a large portion of the right neck. Inferiorly this mass extends into the mediastinum and superiorly this mass extends to the submandibular region. Infiltrative soft tissue is also seen along the left supraclavicular region. Portions of this mass appear to have a cutaneous component, for example along the right aspect of the chin, as well as the right sternoclavicular joint. Findings are concerning for malignancy, with differential considerations including lymphoma or sarcoma. Tissue sampling is recommended. Additionally CT of the chest is also recommended to fully characterize the mediastinal extent of this mass. 2. 2.8 x 1.8 cm lesion with central hypodensity along the inferior aspect of the right submandibular gland could represent a centrally necrotic lymph node. Electronically Signed   By: Marin Roberts M.D.   On: 11/05/2022 10:29    ASSESSMENT & PLAN:    41 year old male with HIV/AIDS with newly diagnosed Burkitt's lymphoma   #1 Newly diagnosed Burkitt's lymphoma Presenting with rapidly growing right neck mass extending into the chest, bilateral x-ray lymphadenopathy and also concern for possible involvement of  retroperitoneal lymph nodes. Concerning for at least stage II possibly stage III disease. Some borderline lymphadenopathy in the abdomen could also be from his recently diagnosed HIV/AIDS. No clinical symptoms suggestive of CNS involvement at this time. No constitutional symptoms. No significant cytopenias to suggest bone marrow involvement. Discussed with patient and he is not interested in fertility preservation or sperm banking. First presented in August at [facility] and was treated empirically with steroids and antibiotics with improvement in swelling prior to additional progression.   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  Patient reports this was completely treated.   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  On allopurinol for TLS prophylaxis  PLAN: -Patient notes no acute toxicities from cycle 1 day 4 of daEPOCH-R chemotherapy for his newly diagnosed Burkitt's lymphoma. -Labs this morning are stable uric acid levels of magnesium and phosphorus levels.  No uncontrolled tumor lysis syndrome. -Continue allopurinol for TLS prophylaxis at least till the second cycle of treatment. -Initial lumbar puncture results reviewed.  Pathology was called  to make sure we have a flow cytometry on the spinal fluid as well. -Spinal fluid cytology pending -Bone marrow biopsy results reviewed with the patient and his family brother was seen in person and wife and other family member were on the phone.  Bone marrow shows no overt evidence of lymphoma involvement. -Ordered one-time dose of Tylenol and Compazine due to headache and some nausea related to his spinal tap. -Also placed order for as needed lorazepam -He was recommended additional bedrest today and good hydration and some caffeine intake to help with the spinal headaches. -Rituxan and Neulasta as outpatient at [facility] on  11/25/2022 -Salt and baking soda mouthwashes 4 times a day to reduce mucositis. -On MiraLAX and senna S for constipation -Daily CBC, CMP and uric acid. -On Bactrim for PJP prophylaxis.  Okay to hold Bactrim for a few days as intrathecal methotrexate. -HIV AIDS management per infectious disease currently on Biktarvy.  The total time spent in the appointment was 40 minutes*.  All of the patient's questions were answered with apparent satisfaction. The patient knows to call the clinic with any problems, questions or concerns.   Sullivan Lone MD MS AAHIVMS [facility] Hematology/Oncology Physician [facility]  .*Total Encounter Time as defined by the Centers for Medicare and Medicaid Services includes, in addition to the face-to-face time of a patient visit (documented in the note above) non-face-to-face time: obtaining and reviewing outside history, ordering and reviewing medications, tests or procedures, care coordination (communications with other health care professionals or caregivers) and documentation in the medical record.

## 2022-11-21 NOTE — Progress Notes (Signed)
PROGRESS NOTE    Logan Ward  WUJ:[id number] DOB: 07-Feb-1981 DOA: 11/10/2022 PCP: Pcp, No   Brief Narrative:  41 y.o. male with medical history of HIV/AIDS and syphilis presented with a rapidly growing mass in the right anterior chest wall.  On presentation, CT scan revealed 8 cm x 5.9 cm x 7.4 cm soft tissue mass with centralized necrosis extending anterior to the thyroid and into the upper chest with surrounding lymphadenopathy.  ID/cardiothoracic surgery/ENT/IR were consulted.  He underwent ultrasound-guided biopsy of the right neck mass by IR on 11/12/2022.  IV antibiotics were subsequently started after the biopsy.  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  Oncology was consulted.  Patient was transferred to [facility] 12/1 and planning to undergo bone marrow biopsy,  port placement, PET and initiate chemotherapy 12/4. PICC line placed 12/3 but since he was able to get his port placed, PICC line was subsequently removed.  He underwent bone marrow biopsy and port placement 12/4 and was started on chemotherapy 12/4.  Patient had lumbar with intrathecal Rituxan administration on 12/6.   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  PICC line removed, Port-A-Cath in place.  Currently there is no evidence of CNS involvement  and port placement 12/4.  Started chemotherapy 12/4  -Final pathology consistent with B-cell lymphoma -Status post bone marrow biopsy - pending.  -Prophylactic allopurinol for tumor lysis syndrome -Decadron twice  daily -MRI brain is negative - PET scan shows hypermetabolic regions in cervical, neck and axillary mass consistent with high-grade lymphoma Underwent diagnostic lumbar puncture and intrathecal methotrexate on 12/6  Dizziness - IV fluid bolus.  As needed meclizine   HIV/AIDS -On Biktarvy. Seen by ID - Bactrim for PJP prophylaxis    DVT prophylaxis: SCDs Start: 11/10/22 1746 Code Status: Full code Family Communication:    Status is: Inpatient Maintain hospital stay until cleared by oncology   Nutritional status    Signs/Symptoms: estimated needs  Interventions: Ensure Enlive (each supplement provides 350kcal and 20 grams of protein), Refer to RD note for recommendations  Body mass index is 37.14 kg/m.         Subjective: Patient has mild headache this morning since after getting lumbar puncture.  Also reports of some dizziness and poor oral intake.  Examination:  Constitutional: Not in acute distress Respiratory: Clear to auscultation bilaterally Cardiovascular: Normal sinus rhythm, no rubs Abdomen: Nontender nondistended good bowel sounds Musculoskeletal: No edema noted Skin: No rashes seen Neurologic: CN 2-12 grossly intact.  And nonfocal Psychiatric: Normal judgment and insight. Alert and oriented x 3. Normal mood. Objective: Vitals:   11/20/22 0441 11/20/22 1354 11/20/22 1948 11/21/22 0517  BP: 122/69 117/60 110/60 109/66  Pulse: (!) 58 71 75 61  Resp: _0 Temp: 98 F (36.7 C) 98.1 F (36.7 C) 98.4 F (36.9 C) 98.4 F (36.9 C)  TempSrc: Oral Oral Oral Oral  SpO2: 99% 97% 98% 98%  Weight:      Height:        Intake/Output Summary (Last 24  hours) at 11/21/2022 0742 Last data filed at 11/21/2022 0320 Gross per 24 hour  Intake 761.05 ml  Output --  Net 761.05 ml   Filed Weights   11/11/22 0730 11/11/22 1755  Weight: 123.4 kg 127.7 kg     Data Reviewed:   CBC: Recent Labs  Lab 11/17/22 0928 11/18/22 0500 11/19/22 0545  11/20/22 0552 11/21/22 0455  WBC 9.4 8.3 7.6 3.7* 4.5  NEUTROABS 5.4 4.9 4.8 2.5 3.3  HGB 12.5* 12.7* 12.7* 13.3 13.3  HCT 39.8 40.6 39.7 41.7 41.1  MCV 84.9 85.3 84.3 84.6 82.4  PLT 251 266 244 230 469   Basic Metabolic Panel: Recent Labs  Lab 11/17/22 0928 11/18/22 0500 11/19/22 0545 11/20/22 0552 11/21/22 0455  NA 136 133* 132* 129* 133*  K 4.4 4.6 4.4 4.7 4.5  CL 103 102 101 98 100  CO2 _0 GLUCOSE 89 109* 109* 122* 127*  BUN _1 CREATININE 0.84 1.04 0.93 0.95 0.91  CALCIUM 9.2 9.1 8.9 8.6* 8.8*  MG  --   --   --   --  2.7*  PHOS  --   --   --   --  4.6   GFR: Estimated Creatinine Clearance: 152.6 mL/min (by C-G formula based on SCr of 0.91 mg/dL). Liver Function Tests: Recent Labs  Lab 11/17/22 0928 11/18/22 0500 11/19/22 0545 11/20/22 0552 11/21/22 0455  AST _2 ALT _3 ALKPHOS 48 43 45 44 44  BILITOT 1.3* 1.2 1.0 1.1 1.7*  PROT 7.9 7.0 7.3 7.1 7.5  ALBUMIN 3.9 3.5 3.4* 3.4* 3.4*   No results for input(s): "LIPASE", "AMYLASE" in the last 168 hours. No results for input(s): "AMMONIA" in the last 168 hours. Coagulation Profile: No results for input(s): "INR", "PROTIME" in the last 168 hours. Cardiac Enzymes: No results for input(s): "CKTOTAL", "CKMB", "CKMBINDEX", "TROPONINI" in the last 168 hours. BNP (last 3 results) No results for input(s): "PROBNP" in the last 8760 hours. HbA1C: No results for input(s): "HGBA1C" in the last 72 hours. CBG: Recent Labs  Lab 11/19/22 1403  GLUCAP 91   Lipid Profile: No results for input(s): "CHOL", "HDL", "LDLCALC", "TRIG", "CHOLHDL", "LDLDIRECT" in the last 72 hours. Thyroid Function Tests: No results for input(s): "TSH", "T4TOTAL", "FREET4", "T3FREE", "THYROIDAB" in the last 72 hours. Anemia Panel: No results for input(s): "VITAMINB12", "FOLATE", "FERRITIN", "TIBC", "IRON", "RETICCTPCT" in the last 72 hours. Sepsis Labs: No results for input(s): "PROCALCITON",  "LATICACIDVEN" in the last 168 hours.  Recent Results (from the past 240 hour(s))  Aerobic/Anaerobic Culture w Gram Stain (surgical/deep wound)     Status: None   Collection Time: 11/12/22 12:16 PM   Specimen: Tissue  Result Value Ref Range Status   Specimen Description TISSUE  Final   Special Requests NONE  Final   Gram Stain NO WBC SEEN NO ORGANISMS SEEN   Final   Culture   Final    No growth aerobically or anaerobically. Performed at [facility], [place] [address] [place], [place] [address]    Report Status 11/17/2022 FINAL  Final  Acid Fast Smear (AFB)     Status: None   Collection Time: 11/12/22 12:16 PM   Specimen: Soft Tissue Mass Excision  Result Value Ref Range Status   AFB Specimen Processing Concentration  Final   Acid Fast Smear Negative  Final    Comment: (NOTE) Performed At: [place]  [place], [place] [id number] Rush Farmer MD TD:[id number]    Source (AFB) TISSUE  Final    Comment: Performed at [facility], [place] [address] [place], [place] [address]  CSF culture w Gram Stain     Status: None (Preliminary result)   Collection Time: 11/20/22  1:14 PM   Specimen: PATH Cytology CSF; Cerebrospinal Fluid  Result Value Ref Range Status   Specimen Description CSF  Final   Special Requests NONE  Final   Gram Stain   Final    WBC PRESENT, PREDOMINANTLY MONONUCLEAR NO ORGANISMS SEEN Gram Stain Report Called to,Read Back By and Verified With: Inda Coke RN _0  on 12.6.2023 by [facility] Performed at [facility], [place] [address], [name], [place] [address]    Culture PENDING  Incomplete   Report Status PENDING  Incomplete         Radiology Studies: DG Fluoro Guide Spinal/SI Jt Inj Left  Result Date: 11/20/2022 CLINICAL DATA:  Burkitt lymphoma, chemotherapy injection. EXAM: DIAGNOSTIC LUMBAR PUNCTURE UNDER FLUOROSCOPIC GUIDANCE COMPARISON:  None Available. FLUOROSCOPY: Radiation Exposure Index (as provided  by the fluoroscopic device): 14.9 mGy Kerma PROCEDURE: Procedure was discussed with the patient including risks and benefits. Patient's questions were answered. Written informed consent for the procedure was obtained. Timeout protocol followed. L3-4 disc space was localized under C-arm fluoroscopy. Skin prepped and draped in usual sterile fashion. Skin and soft tissues anesthetized with  10 mL of 1% lidocaine. Under sterile technique, 20 gauge spinal needle advanced into spinal canal. Clear colorless CSF was encountered. 7 mL of CSF was obtained for requested analysis. Patient was then injected with 12 mg of methotrexate intrathecally. Procedure tolerated well by patient without immediate complication. IMPRESSION: Successful lumbar puncture with intrathecal methotrexate administration. Electronically Signed   By: Lorin Picket M.D.   On: 11/20/2022 14:08   DG FL GUIDED LUMBAR PUNCTURE  Result Date: 11/20/2022 Lorin Picket, MD     11/20/2022  2:08 PM CLINICAL DATA: [Burkitt lymphoma, chemotherapy injection.] EXAM: DIAGNOSTIC LUMBAR PUNCTURE UNDER FLUOROSCOPIC GUIDANCE COMPARISON: [None Available.] FLUOROSCOPY: Radiation Exposure Index (as provided by the fluoroscopic device): [14.9] [mGy Kerma] PROCEDURE:   Procedure was discussed with the patient including risks and benefits.  Patient's questions were answered. Written informed consent for the procedure was obtained. Timeout protocol followed.  [L3-4] disc space was localized under C-arm fluoroscopy.  Skin prepped and draped in usual sterile fashion. Skin and soft tissues anesthetized with _1  10 mL of 1% lidocaine. Under sterile technique, [20 gauge] spinal needle advanced into spinal canal. Clear colorless CSF was encountered. 7 mL of CSF was obtained for requested analysis. Patient was then injected with 12 mg of methotrexate intrathecally. Procedure tolerated well by patient without immediate complication.   IMPRESSION: [Successful lumbar puncture with  intrathecal methotrexate administration.] Lorin Picket, mD  MR BRAIN W WO CONTRAST  Result Date: 11/19/2022 CLINICAL DATA:  Lymphoma staging. EXAM: MRI HEAD WITHOUT AND WITH CONTRAST TECHNIQUE: Multiplanar, multiecho pulse sequences of the brain and surrounding structures were obtained without and with intravenous contrast. CONTRAST:  34m GADAVIST GADOBUTROL 1 MMOL/ML IV SOLN COMPARISON:  None Available. FINDINGS: Brain: No acute infarction, hemorrhage, hydrocephalus, extra-axial collection or mass lesion. Vascular: Normal flow voids. Skull and upper cervical spine: Normal marrow signal. Sinuses/Orbits: Negative. Other: None. IMPRESSION: No evidence of intracranial metastatic disease. Electronically Signed   By: HMarin RobertsM.D.   On: 11/19/2022 16:17   NM PET Image Initial (PI) Skull Base To Thigh (F-18  FDG)  Result Date: 11/19/2022 CLINICAL DATA:  Initial treatment strategy for Burkitt's lymphoma. Initiation of chemotherapy same day. EXAM: NUCLEAR MEDICINE PET SKULL BASE TO THIGH TECHNIQUE: 14.3 mCi F-18 FDG was injected intravenously. Full-ring PET imaging was performed from the skull base to thigh after the radiotracer. CT data was obtained and used for attenuation correction and anatomic localization. Fasting blood glucose: 14.3 mg/dl COMPARISON:  None Available. FINDINGS: Mediastinal blood pool activity: SUV max 2.7 Liver activity: SUV max 3.8 NECK: A mat of intensely hypermetabolic RIGHT enlarged cervical lymph nodes involve the RIGHT level 2, level 3 and RIGHT supraclavicular nodes. Nodal mass beneath the sternocleidomastoid muscle measures 27 mm on image 40 with SUV max equal 20.23 RIGHT neck mass superficial to the sternocleidomastoid muscle extends to the skin surface above the RIGHT clavicle measuring 5.1 cm with SUV max equal 33 point 1 (image 44). Incidental CT findings: None. CHEST: Hypermetabolic RIGHT jugular nodal conglomerateextends into the upper thoracic inlet and RIGHT upper  mediastinum with SUV max equal 17 on image 50. No lower paratracheal adenopathy.  No hilar adenopathy. There are hypermetabolic RIGHT axillary nodes which are relatively small but intensely hypermetabolic. For example 8 mm node on image 58/4 with SUV max equal 8.6. Incidental CT findings: No suspicious pulmonary nodules. Port in the anterior chest wall with tip in distal SVC. ABDOMEN/PELVIS: No abnormal hypermetabolic activity within the liver, pancreas, adrenal glands, or spleen. No hypermetabolic lymph nodes in the abdomen or pelvis. Spleen is normal volume and normal metabolic activity. Activity along the anterior LEFT chest wall related to port ejection is incidental/artifactual. Incidental CT findings: None. SKELETON: No abnormal marrow activity suggest lymphoma involvement. Incidental CT findings: None. IMPRESSION: 1. Enlarged intense hypermetabolic RIGHT cervical nodal mass extending from the level 2 nodal station to the RIGHT supraclavicular nodal station. Findings consistent high-grade lymphoma 2. Superficial RIGHT neck mass extends to the skin surface superficial to the RIGHT sternocleidomastoid muscle. Intense metabolic activity consistent with high-grade lymphoma. 3. Hypermetabolic small RIGHT axillary nodal disease. 4. No evidence of spleen involvement or solid organ involvement below the diaphragms. 5. No evidence of marrow involvement. Electronically Signed   By: Suzy Bouchard M.D.   On: 11/19/2022 16:02        Scheduled Meds:  allopurinol  100 mg Oral BID   bictegravir-emtricitabine-tenofovir AF  1 tablet Oral Daily   Chlorhexidine Gluconate Cloth  6 each Topical Daily   dexamethasone  6 mg Oral BID WC   DOXOrubicin (ADRIAMYCIN) 26 mg, etoposide (VEPESID) 128 mg, vinCRIStine (ONCOVIN) 1 mg in sodium chloride 0.9 % 1,000 mL chemo infusion   Intravenous Once   feeding supplement  237 mL Oral BID BM   lidocaine  5 mL Other Once   polyethylene glycol  17 g Oral Daily   senna-docusate  1  tablet Oral BID   sodium bicarbonate/sodium chloride   Mouth Rinse QID   sodium chloride flush  10-40 mL Intracatheter Q12H   [START ON 11/22/2022] sulfamethoxazole-trimethoprim  1 tablet Oral Daily   Continuous Infusions:   LOS: 11 days   Time spent= 35 mins    Arvis Zwahlen Arsenio Loader, MD [facility]  If 7PM-7AM, please contact night-coverage  11/21/2022, 7:42 AM

## 2022-11-21 NOTE — Progress Notes (Signed)
Tbili = 1.7 OK to treat per Dr. Irene Limbo.  Kennith Center, Pharm.D., CPP 11/21/2022'@11'$ :11 AM

## 2022-11-21 NOTE — Progress Notes (Signed)
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  He is receiving his cycle 1 day 3 of dose adjusted EPOCH-R.  Notes that he has been taking laxatives and hopes he will have a bowel movement soon.  Good p.o. intake. Had his PET scan and MRI of the brain yesterday and the results of this were discussed in details. She will be going down for his lumbar puncture and intrathecal methotrexate today. Discussed with pharmacist and held his Bactrim today and tomorrow. No fevers no chills no night sweats. No other acute new concerns.  OBJECTIVE:  NAD  PHYSICAL EXAMINATION: . Vitals:   11/20/22 0441 11/20/22 1354 11/20/22 1948 11/21/22 0517  BP: 122/69 117/60 110/60 109/66  Pulse: (!) 58 71 75 61  Resp: _0 Temp: 98 F (36.7 C) 98.1 F (36.7 C) 98.4 F (36.9 C) 98.4 F (36.9 C)  TempSrc: Oral Oral Oral Oral  SpO2: 99% 97% 98% 98%  Weight:      Height:       Filed Weights   11/11/22 0730 11/11/22 1755  Weight: 272 lb 0.8 oz (123.4 kg) 281 lb 8.4 oz (127.7 kg)   .Body mass index is 37.14 kg/m. [name] GENERAL:alert, in no acute distress and comfortable SKIN: no acute rashes, no significant lesions EYES: conjunctiva are pink and non-injected, sclera anicteric OROPHARYNX: MMM, no exudates, no oropharyngeal erythema or ulceration NECK: supple, no JVD LYMPH:  no palpable lymphadenopathy in the cervical, axillary or inguinal regions LUNGS: clear to auscultation b/l with normal respiratory effort HEART: regular rate & rhythm ABDOMEN:  normoactive bowel sounds , non tender, not distended. Extremity: no pedal edema PSYCH: alert & oriented x 3 with fluent speech NEURO: no focal motor/sensory deficits    MEDICAL HISTORY:  Past Medical History:  Diagnosis Date   Anal fissure    HIV infection  ([place])    Internal hemorrhoids    Obesity    Rectal ulcer     SURGICAL HISTORY: Past Surgical History:  Procedure Laterality Date   IR IMAGING GUIDED PORT INSERTION  11/18/2022    SOCIAL HISTORY: Social History   Socioeconomic History   Marital status: Single    Spouse name: Not on file   Number of children: Not on file   Years of education: Not on file   Highest education level: Not on file  Occupational History   Not on file  Tobacco Use   Smoking status: Never   Smokeless tobacco: Never  Vaping Use   Vaping Use: Never used  Substance and Sexual Activity   Alcohol use: No   Drug use: No   Sexual activity: Yes    Comment: given condoms  Other Topics Concern   Not on file  Social History Narrative   Not on file   Social Determinants of Health   Financial Resource Strain: Low Risk  (10/24/2022)   Overall Financial Resource Strain (CARDIA)    Difficulty of Paying Living Expenses: Not hard at all  Food Insecurity: No Food Insecurity (11/11/2022)   Hunger Vital Sign    Worried About Running Out of Food in the Last Year: Never true    Ran Out of Food in the Last Year: Never true  Transportation Needs: No Transportation Needs (11/11/2022)   PRAPARE - Hydrologist (Medical): No  Lack of Transportation (Non-Medical): No  Physical Activity: Sufficiently Active (10/24/2022)   Exercise Vital Sign    Days of Exercise per Week: 3 days    Minutes of Exercise per Session: 60 min  Recent Concern: Physical Activity - Insufficiently Active (10/24/2022)   Exercise Vital Sign    Days of Exercise per Week: 3 days    Minutes of Exercise per Session: 20 min  Stress: No Stress Concern Present (10/24/2022)   [place] of Stress : Not at all  Social Connections: Unknown (10/24/2022)   Social Connection and Isolation Panel [NHANES]    Frequency of Communication with Friends and  Family: Twice a week    Frequency of Social Gatherings with Friends and Family: Twice a week    Attends Religious Services: 1 to 4 times per year    Active Member of [employer] or Organizations: No    Attends Archivist Meetings: 1 to 4 times per year    Marital Status: Patient refused  Intimate Partner Violence: Not At Risk (11/11/2022)   Humiliation, Afraid, Rape, and Kick questionnaire    Fear of Current or Ex-Partner: No    Emotionally Abused: No    Physically Abused: No    Sexually Abused: No    FAMILY HISTORY: Family History  Problem Relation Age of Onset   Hypertension Father    Throat cancer Father    Heart attack Brother    Colon cancer Neg Hx    Esophageal cancer Neg Hx    Stomach cancer Neg Hx     ALLERGIES:  has No Known Allergies.  MEDICATIONS:  Scheduled Meds:  allopurinol  100 mg Oral BID   bictegravir-emtricitabine-tenofovir AF  1 tablet Oral Daily   Chlorhexidine Gluconate Cloth  6 each Topical Daily   dexamethasone  6 mg Oral BID WC   DOXOrubicin (ADRIAMYCIN) 26 mg, etoposide (VEPESID) 128 mg, vinCRIStine (ONCOVIN) 1 mg in sodium chloride 0.9 % 1,000 mL chemo infusion   Intravenous Once   feeding supplement  237 mL Oral BID BM   lidocaine  5 mL Other Once   polyethylene glycol  17 g Oral Daily   senna-docusate  1 tablet Oral BID   sodium bicarbonate/sodium chloride   Mouth Rinse QID   sodium chloride flush  10-40 mL Intracatheter Q12H   [START ON 11/22/2022] sulfamethoxazole-trimethoprim  1 tablet Oral Daily   Continuous Infusions:   PRN Meds:.acetaminophen **OR** acetaminophen, bisacodyl, guaiFENesin, hydrALAZINE, iohexol, ipratropium-albuterol, magnesium hydroxide, meclizine, metoprolol tartrate, ondansetron **OR** ondansetron (ZOFRAN) IV, oxyCODONE, sodium chloride flush, sodium phosphate, traMADol, traZODone  REVIEW OF SYSTEMS:    .10 Point review of Systems was done is negative except as noted above.    LABORATORY DATA:  I have reviewed  the data as listed  Labs from today reviewed  RADIOGRAPHIC STUDIES: I have personally reviewed the radiological images as listed and agreed with the findings in the report. DG Fluoro Guide Spinal/SI Jt Inj Left  Result Date: 11/20/2022 CLINICAL DATA:  Burkitt lymphoma, chemotherapy injection. EXAM: DIAGNOSTIC LUMBAR PUNCTURE UNDER FLUOROSCOPIC GUIDANCE COMPARISON:  None Available. FLUOROSCOPY: Radiation Exposure Index (as provided by the fluoroscopic device): 14.9 mGy Kerma PROCEDURE: Procedure was discussed with the patient including risks and benefits. Patient's questions were answered. Written informed consent for the procedure was obtained. Timeout protocol followed. L3-4 disc space was localized under C-arm fluoroscopy. Skin prepped and draped in usual sterile fashion. Skin and soft tissues anesthetized with  10 mL of 1% lidocaine. Under sterile technique, 20 gauge spinal needle advanced into spinal canal. Clear colorless CSF was encountered. 7 mL of CSF was obtained for requested analysis. Patient was then injected with 12 mg of methotrexate intrathecally. Procedure tolerated well by patient without immediate complication. IMPRESSION: Successful lumbar puncture with intrathecal methotrexate administration. Electronically Signed   By: Lorin Picket M.D.   On: 11/20/2022 14:08   DG FL GUIDED LUMBAR PUNCTURE  Result Date: 11/20/2022 Lorin Picket, MD     11/20/2022  2:08 PM CLINICAL DATA: [Burkitt lymphoma, chemotherapy injection.] EXAM: DIAGNOSTIC LUMBAR PUNCTURE UNDER FLUOROSCOPIC GUIDANCE COMPARISON: [None Available.] FLUOROSCOPY: Radiation Exposure Index (as provided by the fluoroscopic device): [14.9] [mGy Kerma] PROCEDURE:   Procedure was discussed with the patient including risks and benefits.  Patient's questions were answered. Written informed consent for the procedure was obtained. Timeout protocol followed.  [L3-4] disc space was localized under C-arm fluoroscopy.  Skin prepped and  draped in usual sterile fashion. Skin and soft tissues anesthetized with _0  10 mL of 1% lidocaine. Under sterile technique, [20 gauge] spinal needle advanced into spinal canal. Clear colorless CSF was encountered. 7 mL of CSF was obtained for requested analysis. Patient was then injected with 12 mg of methotrexate intrathecally. Procedure tolerated well by patient without immediate complication.   IMPRESSION: [Successful lumbar puncture with intrathecal methotrexate administration.] Lorin Picket, mD  MR BRAIN W WO CONTRAST  Result Date: 11/19/2022 CLINICAL DATA:  Lymphoma staging. EXAM: MRI HEAD WITHOUT AND WITH CONTRAST TECHNIQUE: Multiplanar, multiecho pulse sequences of the brain and surrounding structures were obtained without and with intravenous contrast. CONTRAST:  72m GADAVIST GADOBUTROL 1 MMOL/ML IV SOLN COMPARISON:  None Available. FINDINGS: Brain: No acute infarction, hemorrhage, hydrocephalus, extra-axial collection or mass lesion. Vascular: Normal flow voids. Skull and upper cervical spine: Normal marrow signal. Sinuses/Orbits: Negative. Other: None. IMPRESSION: No evidence of intracranial metastatic disease. Electronically Signed   By: HMarin RobertsM.D.   On: 11/19/2022 16:17   NM PET Image Initial (PI) Skull Base To Thigh (F-18 FDG)  Result Date: 11/19/2022 CLINICAL DATA:  Initial treatment strategy for Burkitt's lymphoma. Initiation of chemotherapy same day. EXAM: NUCLEAR MEDICINE PET SKULL BASE TO THIGH TECHNIQUE: 14.3 mCi F-18 FDG was injected intravenously. Full-ring PET imaging was performed from the skull base to thigh after the radiotracer. CT data was obtained and used for attenuation correction and anatomic localization. Fasting blood glucose: 14.3 mg/dl COMPARISON:  None Available. FINDINGS: Mediastinal blood pool activity: SUV max 2.7 Liver activity: SUV max 3.8 NECK: A mat of intensely hypermetabolic RIGHT enlarged cervical lymph nodes involve the RIGHT level 2, level 3 and  RIGHT supraclavicular nodes. Nodal mass beneath the sternocleidomastoid muscle measures 27 mm on image 40 with SUV max equal 20.23 RIGHT neck mass superficial to the sternocleidomastoid muscle extends to the skin surface above the RIGHT clavicle measuring 5.1 cm with SUV max equal 33 point 1 (image 44). Incidental CT findings: None. CHEST: Hypermetabolic RIGHT jugular nodal conglomerateextends into the upper thoracic inlet and RIGHT upper mediastinum with SUV max equal 17 on image 50. No lower paratracheal adenopathy.  No hilar adenopathy. There are hypermetabolic RIGHT axillary nodes which are relatively small but intensely hypermetabolic. For example 8 mm node on image 58/4 with SUV max equal 8.6. Incidental CT findings: No suspicious pulmonary nodules. Port in the anterior chest wall with tip in distal SVC. ABDOMEN/PELVIS: No abnormal hypermetabolic activity within the liver, pancreas, adrenal glands, or spleen. No hypermetabolic lymph nodes in  the abdomen or pelvis. Spleen is normal volume and normal metabolic activity. Activity along the anterior LEFT chest wall related to port ejection is incidental/artifactual. Incidental CT findings: None. SKELETON: No abnormal marrow activity suggest lymphoma involvement. Incidental CT findings: None. IMPRESSION: 1. Enlarged intense hypermetabolic RIGHT cervical nodal mass extending from the level 2 nodal station to the RIGHT supraclavicular nodal station. Findings consistent high-grade lymphoma 2. Superficial RIGHT neck mass extends to the skin surface superficial to the RIGHT sternocleidomastoid muscle. Intense metabolic activity consistent with high-grade lymphoma. 3. Hypermetabolic small RIGHT axillary nodal disease. 4. No evidence of spleen involvement or solid organ involvement below the diaphragms. 5. No evidence of marrow involvement. Electronically Signed   By: Suzy Bouchard M.D.   On: 11/19/2022 16:02   IR IMAGING GUIDED PORT INSERTION  Result Date:  11/18/2022 INDICATION: 41 year old male with history of Burkitt lymphoma requiring central venous access for chemotherapy administration. EXAM: IMPLANTED PORT A CATH PLACEMENT WITH ULTRASOUND AND FLUOROSCOPIC GUIDANCE COMPARISON:  None Available. MEDICATIONS: None. ANESTHESIA/SEDATION: Moderate (conscious) sedation was employed during this procedure. A total of Versed 2 mg and Fentanyl 100 mcg was administered intravenously. Moderate Sedation Time: 22 minutes. The patient's level of consciousness and vital signs were monitored continuously by radiology nursing throughout the procedure under my direct supervision. CONTRAST:  None FLUOROSCOPY TIME:  Three mGy COMPLICATIONS: None immediate. PROCEDURE: The procedure, risks, benefits, and alternatives were explained to the patient. Questions regarding the procedure were encouraged and answered. The patient understands and consents to the procedure. The left neck and chest were prepped with chlorhexidine in a sterile fashion, and a sterile drape was applied covering the operative field. Maximum barrier sterile technique with sterile gowns and gloves were used for the procedure. A timeout was performed prior to the initiation of the procedure. Ultrasound was used to examine the jugular vein which was compressible and free of internal echoes. A skin marker was used to demarcate the planned venotomy and port pocket incision sites. Local anesthesia was provided to these sites and the subcutaneous tunnel track with 1% lidocaine with 1:100,000 epinephrine. A small incision was created at the jugular access site and blunt dissection was performed of the subcutaneous tissues. Under ultrasound guidance, the jugular vein was accessed with a 21 ga micropuncture needle and an 0.018" wire was inserted to the superior vena cava. Real-time ultrasound guidance was utilized for vascular access including the acquisition of a permanent ultrasound image documenting patency of the accessed  vessel. A 5 Fr micopuncture set was then used, through which a 0.035" Rosen wire was passed under fluoroscopic guidance into the inferior vena cava. An 8 Fr dilator was then placed over the wire. A subcutaneous port pocket was then created along the upper chest wall utilizing a combination of sharp and blunt dissection. The pocket was irrigated with sterile saline, packed with gauze, and observed for hemorrhage. A single lumen "ISP" sized power injectable port was chosen for placement. The 8 Fr catheter was tunneled from the port pocket site to the venotomy incision. The port was placed in the pocket. The external catheter was trimmed to appropriate length. The dilator was exchanged for an 8 Fr peel-away sheath under fluoroscopic guidance. The catheter was then placed through the sheath and the sheath was removed. Final catheter positioning was confirmed and documented with a fluoroscopic spot radiograph. The port was accessed with a Huber needle, aspirated, and flushed with heparinized saline. The deep dermal layer of the port pocket incision was closed with interrupted  3-0 Vicryl suture. The skin was opposed with a running subcuticular 4-0 Monocryl suture. Dermabond was then placed over the port pocket and neck incisions. The patient tolerated the procedure well without immediate post procedural complication. FINDINGS: After catheter placement, the tip lies within the superior cavoatrial junction. The catheter aspirates and flushes normally and is ready for immediate use. IMPRESSION: Successful placement of a power injectable Port-A-Cath via the left internal jugular vein. The catheter is ready for immediate use. Ruthann Cancer, MD [facility] Electronically Signed   By: Ruthann Cancer M.D.   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 EXAM: CT-GUIDED  BONE MARROW BIOPSY AND ASPIRATION MEDICATIONS: None ANESTHESIA/SEDATION: Fentanyl 100 mcg IV; Versed 2 mg IV Sedation Time: 16 minutes; The patient was continuously monitored during the procedure by the interventional radiology nurse under my direct supervision. COMPLICATIONS: None immediate. PROCEDURE: Informed consent was obtained from the patient following an explanation of the procedure, risks, benefits and alternatives. The patient understands, agrees and consents for the procedure. All questions were addressed. A time out was performed prior to the initiation of the procedure. The patient was positioned prone and non-contrast localization CT was performed of the pelvis to demonstrate the iliac marrow spaces. The operative site was prepped and draped in the usual sterile fashion. Under sterile conditions and local anesthesia, a 22 gauge spinal needle was utilized for procedural planning. Next, an 11 gauge coaxial bone biopsy needle was advanced into the right iliac marrow space. Needle position was confirmed with CT imaging. Initially, a bone marrow aspiration was performed. Next, a bone marrow biopsy was obtained with the 11 gauge outer bone marrow device. Samples were prepared with the cytotechnologist and deemed adequate. The needle was removed and superficial hemostasis was obtained with manual compression. A dressing was applied. The patient tolerated the procedure well without immediate post procedural complication. IMPRESSION: Successful CT guided right iliac bone marrow aspiration and core biopsy. Ruthann Cancer, MD [facility] Rad[facility] Electronically Signed   By: Ruthann Cancer M.D.   On: 11/18/2022 11:34   [place] EKG SITE RITE  Result Date: 11/15/2022 If Site Rite image not attached, placement could not be confirmed due to current cardiac rhythm.  ECHOCARDIOGRAM COMPLETE  Result Date: 11/14/2022    ECHOCARDIOGRAM REPORT   Patient Name:   Logan Ward Date of Exam: 11/14/2022 Medical Rec #:  [id number]      Height:       73.0 in Accession #:    [id number]     Weight:       281.5 lb Date of Birth:  04-02-1981       BSA:          2.488 m Patient Age:    57 years       BP:           129/79 mmHg Patient Gender: M              HR:           75 bpm. Exam Location:  Inpatient Procedure: 2D Echo, Color Doppler, Cardiac Doppler and Intracardiac            Opacification Agent Indications:    Chemo Z09  History:        Patient has no prior history of Echocardiogram examinations.  Chest Mass; Signs/Symptoms:Shortness of Breath.  Sonographer:    Greer Pickerel Referring Phys: [id number] Brunetta Genera  Sonographer Comments: Image acquisition challenging due to patient body habitus and Image acquisition challenging due to respiratory motion. Global longitudinal strain was attempted. IMPRESSIONS  1. Left ventricular ejection fraction, by estimation, is 60 to 65%. The left ventricle has normal function. The left ventricle has no regional wall motion abnormalities. There is mild left ventricular hypertrophy. Left ventricular diastolic parameters were normal.  2. Right ventricular systolic function is normal. The right ventricular size is normal. Tricuspid regurgitation signal is inadequate for assessing PA pressure.  3. The mitral valve is normal in structure. No evidence of mitral valve regurgitation.  4. The aortic valve is tricuspid. Aortic valve regurgitation is not visualized.  5. The inferior vena cava is normal in size with greater than 50% respiratory variability, suggesting right atrial pressure of 3 mmHg. Comparison(s): No prior Echocardiogram. FINDINGS  Left Ventricle: Left ventricular ejection fraction, by estimation, is 60 to 65%. The left ventricle has normal function. The left ventricle has no regional wall motion abnormalities. Definity contrast agent was given IV to delineate the left ventricular  endocardial borders. The left ventricular  internal cavity size was normal in size. There is mild left ventricular hypertrophy. Left ventricular diastolic parameters were normal. Right Ventricle: The right ventricular size is normal. No increase in right ventricular wall thickness. Right ventricular systolic function is normal. Tricuspid regurgitation signal is inadequate for assessing PA pressure. Left Atrium: Left atrial size was normal in size. Right Atrium: Right atrial size was normal in size. Pericardium: There is no evidence of pericardial effusion. Mitral Valve: The mitral valve is normal in structure. No evidence of mitral valve regurgitation. Tricuspid Valve: The tricuspid valve is normal in structure. Tricuspid valve regurgitation is not demonstrated. Aortic Valve: The aortic valve is tricuspid. Aortic valve regurgitation is not visualized. Pulmonic Valve: Pulmonic valve regurgitation is not visualized. Aorta: The aortic root and ascending aorta are structurally normal, with no evidence of dilitation. Venous: The inferior vena cava is normal in size with greater than 50% respiratory variability, suggesting right atrial pressure of 3 mmHg. IAS/Shunts: No atrial level shunt detected by color flow Doppler.  LEFT VENTRICLE PLAX 2D LVIDd:         3.60 cm   Diastology LVIDs:         2.40 cm   LV e' medial:    6.85 cm/s LV PW:         1.40 cm   LV E/e' medial:  13.3 LV IVS:        0.80 cm   LV e' lateral:   12.70 cm/s LVOT diam:     2.10 cm   LV E/e' lateral: 7.1 LV SV:         77 LV SV Index:   31 LVOT Area:     3.46 cm  RIGHT VENTRICLE RV S prime:     17.70 cm/s TAPSE (M-mode): 2.8 cm LEFT ATRIUM           Index        RIGHT ATRIUM           Index LA diam:      3.10 cm 1.25 cm/m   RA Area:     16.80 cm LA Vol (A2C): 46.4 ml 18.65 ml/m  RA Volume:   40.80 ml  16.40 ml/m LA Vol (A4C): 36.6 ml 14.71 ml/m  AORTIC VALVE LVOT Vmax:   134.00  cm/s LVOT Vmean:  81.400 cm/s LVOT VTI:    0.223 m  AORTA Ao Root diam: 3.80 cm Ao Asc diam:  3.20 cm MITRAL  VALVE MV Area (PHT): 3.63 cm    SHUNTS MV Decel Time: 209 msec    Systemic VTI:  0.22 m MR Peak grad: 3.7 mmHg     Systemic Diam: 2.10 cm MR Vmax:      96.60 cm/s MV E velocity: 90.80 cm/s MV A velocity: 76.70 cm/s MV E/A ratio:  1.18 Landscape architect signed by Phineas Inches Signature Date/Time: 11/14/2022/4:58:35 PM    Final    [place] CORE BIOPSY (SOFT TISSUE)  Result Date: 11/12/2022 INDICATION: 41 year old gentleman with history of lymphoma presents to IR for biopsy of right neck mass EXAM: Ultrasound-guided biopsy of right neck mass MEDICATIONS: None. ANESTHESIA/SEDATION: Moderate (conscious) sedation was employed during this procedure. A total of Versed 2.5 mg and Fentanyl 75 mcg was administered intravenously. Moderate Sedation Time: 10 minutes. The patient's level of consciousness and vital signs were monitored continuously by radiology nursing throughout the procedure under my direct supervision. COMPLICATIONS: None immediate. PROCEDURE: Informed written consent was obtained from the patient after a thorough discussion of the procedural risks, benefits and alternatives. All questions were addressed. Maximal Sterile Barrier Technique was utilized including caps, mask, sterile gowns, sterile gloves, sterile drape, hand hygiene and skin antiseptic. A timeout was performed prior to the initiation of the procedure. Patient position supine on the ultrasound table. Right neck skin prepped and draped in usual sterile fashion. Following local lidocaine administration, 5- 18 gauge cores were obtained from the right neck mass utilizing continuous ultrasound guidance. Two cores were sent for Gram stain and culture in sterile saline. Three cores were sent for histologic analysis in formalin. Needle removed and hemostasis achieved with 5 minutes of manual compression. Post procedure ultrasound images showed no evidence of significant hemorrhage. IMPRESSION: Ultrasound-guided biopsy of right neck mass.  Electronically Signed   By: Miachel Roux M.D.   On: 11/12/2022 17:17   CT Soft Tissue Neck W Contrast  Result Date: 11/10/2022 CLINICAL DATA:  Neck mass with history of HIV. EXAM: CT NECK WITH CONTRAST TECHNIQUE: Multidetector CT imaging of the neck was performed using the standard protocol following the bolus administration of intravenous contrast. RADIATION DOSE REDUCTION: This exam was performed according to the departmental dose-optimization program which includes automated exposure control, adjustment of the mA and/or kV according to patient size and/or use of iterative reconstruction technique. CONTRAST:  137m OMNIPAQUE IOHEXOL 350 MG/ML SOLN COMPARISON:  CT neck 6 days ago. FINDINGS: Pharynx and larynx: The nasal cavity and nasopharynx are unremarkable. The oral cavity and oropharynx are unremarkable. The parapharyngeal spaces are clear. The hypopharynx and larynx are unremarkable. The vocal folds are normal in appearance. There is no retropharyngeal fluid collection.  The airway is patent. Salivary glands: The parotid and submandibular glands are unremarkable. Thyroid: Unremarkable. Lymph nodes: There is enlarged centrally necrotic right level I lymph node measuring up to 2.1 cm in short axis, similar to the CT from 6 days ago. The large infiltrative centrally necrotic soft tissue mass throughout the right neck measuring up to approximately 8.1 cm TV x 5.9 cm AP by 7.4 cm cc is also not significantly changed in size. Inferior extent into the upper chest anterior to the thyroid with centrally necrotic appearing tissue measuring 4.9 cm x 5.4 cm is unchanged when measured again using similar technique. Overlying skin thickening is similar. The mass is inseparable from  the sternocleidomastoid muscle. There are numerous additional prominent right-sided cervical chain lymph nodes measuring up to 1.0 cm at level IIA (3-38), and 1.1 cm in short axis in the posterior triangle (3-43). Vascular: The major  vasculature of the neck is unremarkable. Limited intracranial: The imaged portions of the posterior fossa are unremarkable. Visualized orbits: Not included within the field of view. Mastoids and visualized paranasal sinuses: Clear. Skeleton: There is no acute osseous abnormality or suspicious osseous lesion. Upper chest: Assessed on the separately dictated CT chest. Other: None. IMPRESSION: No significant interval change since the CT neck from 6 days prior. Unchanged infiltrative soft tissue mass in the right neck extending to the upper chest anterior to the clavicle with central necrosis and surrounding lymphadenopathy. Differential again includes malignancy (sarcoma, lymphoma), or infection (including TB. Sampling is recommended. Electronically Signed   By: Valetta Mole M.D.   On: 11/10/2022 16:27   CT CHEST ABDOMEN PELVIS W CONTRAST  Result Date: 11/10/2022 CLINICAL DATA:  Lymphoma, neck mass, metastatic disease evaluation * Tracking Code: BO * EXAM: CT CHEST, ABDOMEN, AND PELVIS WITH CONTRAST TECHNIQUE: Multidetector CT imaging of the chest, abdomen and pelvis was performed following the standard protocol during bolus administration of intravenous contrast. RADIATION DOSE REDUCTION: This exam was performed according to the departmental dose-optimization program which includes automated exposure control, adjustment of the mA and/or kV according to patient size and/or use of iterative reconstruction technique. CONTRAST:  149m OMNIPAQUE IOHEXOL 350 MG/ML SOLN COMPARISON:  CT neck, 11/04/2022, CT pelvis, 02/09/2017 FINDINGS: CT CHEST FINDINGS Cardiovascular: No significant vascular findings. Normal heart size. No pericardial effusion. Mediastinum/Nodes: Enlarged right lower cervical, supraclavicular, and bilateral axillary lymph nodes, largest right axillary nodes measuring up to 2.2 x 1.7 cm (series 3, image 18). Diffuse fat stranding throughout the superior mediastinum (series 3, image 19), thyroid gland,  trachea, and esophagus demonstrate no significant findings. Lungs/Pleura: Lungs are clear. No pleural effusion or pneumothorax. Musculoskeletal: Partially imaged, heterogeneous mass centered in the anterior right neck, incompletely imaged and better assessed by simultaneous dedicated examination of the neck (series 3, image 1). No acute osseous findings. CT ABDOMEN PELVIS FINDINGS Hepatobiliary: No solid liver abnormality is seen. No gallstones, gallbladder wall thickening, or biliary dilatation. Pancreas: Unremarkable. No pancreatic ductal dilatation or surrounding inflammatory changes. Spleen: Normal in size without significant abnormality. Adrenals/Urinary Tract: Adrenal glands are unremarkable. Kidneys are normal, without renal calculi, solid lesion, or hydronephrosis. Bladder is unremarkable. Stomach/Bowel: Stomach is within normal limits. Appendix appears normal. No evidence of bowel wall thickening, distention, or inflammatory changes. Vascular/Lymphatic: No significant vascular findings are present. Prominent subcentimeter retroperitoneal and iliac lymph nodes, for example a right external iliac node measuring 1.1 x 0.8 cm (series 3, image 102). Reproductive: No mass or other abnormality. Other: No abdominal wall hernia or abnormality. No ascites. Diffuse retroperitoneal fat stranding (series 3, image 80). Musculoskeletal: No acute osseous findings. IMPRESSION: 1. Partially imaged, heterogeneous mass centered in the anterior right neck, incompletely imaged and better assessed by simultaneous dedicated examination of the neck. As previously reported, general differential considerations include both malignancy and infectious process such as scrofula. 2. Enlarged right lower cervical, supraclavicular, and bilateral axillary lymph nodes, which may be reactive although are generally concerning for metastatic disease. 3. Prominent subcentimeter retroperitoneal and iliac lymph nodes, nonspecific although as above  concerning for additional nodal metastatic disease. 4. Diffuse fat stranding throughout the superior mediastinum, of uncertain nature, possibly reflecting mediastinal infectious involvement or alternately vascular/lymphatic congestion. Prior thoracic radiotherapy could likewise  have this appearance. 5. Diffuse retroperitoneal fat stranding, as above of uncertain significance, possibly infectious or inflammatory, for example incidental and related to pancreatitis, or as above perhaps related to prior abdominal radiotherapy. 6. No mass or evidence of organ metastatic disease in the abdomen or pelvis. Electronically Signed   By: Delanna Ahmadi M.D.   On: 11/10/2022 16:26   DG Chest 2 View  Result Date: 11/10/2022 CLINICAL DATA:  Dyspnea EXAM: CHEST - 2 VIEW COMPARISON:  06/27/2016 FINDINGS: Small areas of consolidation or volume loss identified right middle lobe and lingula. No pneumothorax or pleural effusion. Normal pulmonary vasculature. Unremarkable cardiac silhouette. Unremarkable osseous structures. IMPRESSION: Medial bibasilar subsegmental atelectasis or consolidation. Electronically Signed   By: Sammie Bench M.D.   On: 11/10/2022 14:53   CT SOFT TISSUE NECK W CONTRAST  Result Date: 11/05/2022 CLINICAL DATA:  Neck mass. EXAM: CT NECK WITH CONTRAST TECHNIQUE: Multidetector CT imaging of the neck was performed using the standard protocol following the bolus administration of intravenous contrast. RADIATION DOSE REDUCTION: This exam was performed according to the departmental dose-optimization program which includes automated exposure control, adjustment of the mA and/or kV according to patient size and/or use of iterative reconstruction technique. CONTRAST:  4m OMNIPAQUE IOHEXOL 350 MG/ML SOLN COMPARISON:  CT Neck 07/23/22 FINDINGS: Pharynx and larynx: Normal. No mass or swelling. Salivary glands: No inflammation, mass, or stone. Thyroid: Normal. Lymph nodes: There are prominent lymph nodes, for  example a 1.2 cm level 1 a lymph node, unchanged from prior exam. There is also a prominent 8 mm right level 3 lymph, slightly increased in size from prior exam. Along the inferior aspect of the right submandibular gland there is a 2.8 x 1.8 cm lesion with central hypodensity (series 3, image 56) which could represent a centrally necrotic lymph node. Vascular: There is mass effect on the right internal jugular vein (series 3, image 68). Limited intracranial: Negative. Visualized orbits: Negative. Mastoids and visualized paranasal sinuses: Clear. Skeleton: No acute or aggressive process. Upper chest: See below.  No focal pulmonary nodule is visualized. Other: There is marked interval worsening in the degree soft tissue abnormality seen in the right neck compared to prior exam dated 07/23/2022. There is now a large soft tissue mass along the anterior chest wall, adjacent to the sternoclavicular joint on the right measuring 4.2 x 3 4 x 5.6 cm (series 3, image 5). There is infiltrative soft tissue surrounding this mass that extends superiorly along the supraclavicular region to the level of the hyoid bone and submandibular region (series 3, image 64). Inferiorly this mass likely now also extends into the mediastinum where there is soft tissue stranding (series 3, image 115). Infiltrative soft tissues also seen along the left supraclavicular region (series 3, image 77), but to a lesser degree than the right. Portions of this mass appear to have a cutaneous component, for example along the right aspect of the chin (series 3, image 59), as well as the portion adjacent to the right sternoclavicular joint (series 3, image 79). IMPRESSION: 1. Marked interval worsening in the degree of soft tissue abnormality seen in the right neck compared to prior exam dated 07/23/2022. There is now a multi spatial infiltrative soft tissue mass involving a large portion of the right neck. Inferiorly this mass extends into the mediastinum and  superiorly this mass extends to the submandibular region. Infiltrative soft tissue is also seen along the left supraclavicular region. Portions of this mass appear to have a cutaneous component, for  example along the right aspect of the chin, as well as the right sternoclavicular joint. Findings are concerning for malignancy, with differential considerations including lymphoma or sarcoma. Tissue sampling is recommended. Additionally CT of the chest is also recommended to fully characterize the mediastinal extent of this mass. 2. 2.8 x 1.8 cm lesion with central hypodensity along the inferior aspect of the right submandibular gland could represent a centrally necrotic lymph node. Electronically Signed   By: Marin Roberts M.D.   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 Concerning for at least stage II possibly stage III disease. Some borderline lymphadenopathy in the abdomen could also be from his recently diagnosed HIV/AIDS. No clinical symptoms suggestive of CNS involvement at this time. No constitutional symptoms. No significant cytopenias to suggest bone marrow involvement. Discussed with patient and he is not interested in fertility preservation or sperm banking. First presented in August at [facility] and was treated empirically with steroids and antibiotics with improvement in swelling prior to additional progression.   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  Patient reports this was completely treated.   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  On allopurinol for TLS prophylaxis  PLAN: -Patient notes  no acute toxicities from cycle 1 day 3 of daEPOCH-R chemotherapy for his newly diagnosed Burkitt's lymphoma. -Labs today are stable.  Sodium levels slightly low.  Will continue to monitor.  Likely due to hypotonic fluids and should correct. -Continue allopurinol for TLS prophylaxis -Rituxan and Neulasta as outpatient at [facility] on 11/25/2022 -PET CT scan showed enlarged intensely hypermetabolic right cervical nodal mass extending from level 2 nodal station to right supraclavicular nodal station consistent with high-grade lymphoma.  Superficial right neck mass extends to skin surface superficial to the sternocleidomastoid muscle.  Hypermetabolic right axillary nodal disease.  No evidence of solid organ involvement or splenic involvement or other nodal involvement below the diaphragm.  No evidence of bone marrow involvement on PET scan. -MRI of the brain showed no evidence of intracranial metastatic disease from his Burkitt's lymphoma. -Bone marrow biopsy results still pending at this time -He shall be going down for his fluoroscopically guided diagnostic lumbar puncture with intrathecal methotrexate for CNS prophylaxis on today 11/20/2022 by IR.  Discussed post spinal tap precautions with the patient. -Salt and baking soda mouthwashes 4 times a day to reduce mucositis. -On MiraLAX and senna S for constipation -Daily CBC, CMP and uric acid. -On Bactrim for PJP prophylaxis.  Okay to hold Bactrim for a few days as intrathecal methotrexate. -HIV AIDS management per infectious disease currently on Biktarvy.  The total time spent in the appointment was 35 minutes*.  All of the patient's questions were answered with apparent satisfaction. The patient knows to call the clinic with any problems, questions or concerns.   Sullivan Lone MD MS AAHIVMS [facility] Hematology/Oncology Physician [facility]  .*Total Encounter Time as defined by the Centers for Medicare and Medicaid  Services includes, in addition to the face-to-face time of a patient visit (documented in the note above) non-face-to-face time: obtaining and reviewing outside history,  ordering and reviewing medications, tests or procedures, care coordination (communications with other health care professionals or caregivers) and documentation in the medical record.

## 2022-11-22 ENCOUNTER — Other Ambulatory Visit (HOSPITAL_COMMUNITY): Payer: Self-pay

## 2022-11-22 ENCOUNTER — Other Ambulatory Visit: Payer: Self-pay

## 2022-11-22 ENCOUNTER — Encounter: Payer: Self-pay | Admitting: Hematology

## 2022-11-22 DIAGNOSIS — R222 Localized swelling, mass and lump, trunk: Secondary | ICD-10-CM | POA: Diagnosis not present

## 2022-11-22 DIAGNOSIS — C8378 Burkitt lymphoma, lymph nodes of multiple sites: Secondary | ICD-10-CM | POA: Diagnosis not present

## 2022-11-22 DIAGNOSIS — Z5111 Encounter for antineoplastic chemotherapy: Secondary | ICD-10-CM | POA: Diagnosis not present

## 2022-11-22 LAB — COMPREHENSIVE METABOLIC PANEL
ALT: 21 U/L (ref 0–44)
AST: 23 U/L (ref 15–41)
Albumin: 3.3 g/dL — ABNORMAL LOW (ref 3.5–5.0)
Alkaline Phosphatase: 45 U/L (ref 38–126)
Anion gap: 9 (ref 5–15)
BUN: 16 mg/dL (ref 6–20)
CO2: 23 mmol/L (ref 22–32)
Calcium: 8.6 mg/dL — ABNORMAL LOW (ref 8.9–10.3)
Chloride: 97 mmol/L — ABNORMAL LOW (ref 98–111)
Creatinine, Ser: 0.9 mg/dL (ref 0.61–1.24)
GFR, Estimated: >60 mL/min (ref >60)
Glucose, Bld: 115 mg/dL — ABNORMAL HIGH (ref 70–99)
Potassium: 3.9 mmol/L (ref 3.5–5.1)
Sodium: 129 mmol/L — ABNORMAL LOW (ref 135–145)
Total Bilirubin: 1.8 mg/dL — ABNORMAL HIGH (ref 0.3–1.2)
Total Protein: 6.5 g/dL (ref 6.5–8.1)

## 2022-11-22 LAB — CBC WITH DIFFERENTIAL/PLATELET
Abs Immature Granulocytes: 0.01 10*3/uL (ref 0.00–0.07)
Basophils Absolute: 0 10*3/uL (ref 0.0–0.1)
Basophils Relative: 0 %
Eosinophils Absolute: 0 10*3/uL (ref 0.0–0.5)
Eosinophils Relative: 0 %
HCT: 41 % (ref 39.0–52.0)
Hemoglobin: 13.4 g/dL (ref 13.0–17.0)
Immature Granulocytes: 0 %
Lymphocytes Relative: 24 %
Lymphs Abs: 0.9 10*3/uL (ref 0.7–4.0)
MCH: 26.8 pg (ref 26.0–34.0)
MCHC: 32.7 g/dL (ref 30.0–36.0)
MCV: 82 fL (ref 80.0–100.0)
Monocytes Absolute: 0.1 10*3/uL (ref 0.1–1.0)
Monocytes Relative: 3 %
Neutro Abs: 2.7 10*3/uL (ref 1.7–7.7)
Neutrophils Relative %: 73 %
Platelets: 245 10*3/uL (ref 150–400)
RBC: 5 MIL/uL (ref 4.22–5.81)
RDW: 13.5 % (ref 11.5–15.5)
WBC: 3.7 10*3/uL — ABNORMAL LOW (ref 4.0–10.5)
nRBC: 0.5 % — ABNORMAL HIGH (ref 0.0–0.2)

## 2022-11-22 LAB — MAGNESIUM: Magnesium: 2.3 mg/dL (ref 1.7–2.4)

## 2022-11-22 LAB — URIC ACID: Uric Acid, Serum: 5.6 mg/dL (ref 3.7–8.6)

## 2022-11-22 MED ORDER — POLYETHYLENE GLYCOL 3350 17 G PO PACK
17.0000 g | PACK | Freq: Every day | ORAL | 0 refills | Status: DC
  Filled 2022-11-22: qty 14, 14d supply, fill #0

## 2022-11-22 MED ORDER — SODIUM CHLORIDE 0.9 % IV SOLN: INTRAVENOUS | Status: DC

## 2022-11-22 MED ORDER — ONDANSETRON HCL 8 MG PO TABS
8.0000 mg | ORAL_TABLET | Freq: Three times a day (TID) | ORAL | 3 refills | Status: DC | PRN
  Filled 2022-11-22: qty 30, 10d supply, fill #0

## 2022-11-22 MED ORDER — BIKTARVY 50-200-25 MG PO TABS: 1.0000 | ORAL_TABLET | Freq: Every day | ORAL | 5 refills | Status: DC

## 2022-11-22 MED ORDER — DEXAMETHASONE 4 MG PO TABS
4.0000 mg | ORAL_TABLET | Freq: Two times a day (BID) | ORAL | 0 refills | Status: DC
  Filled 2022-11-22: qty 30, 15d supply, fill #0

## 2022-11-22 MED ORDER — HEPARIN SOD (PORK) LOCK FLUSH 100 UNIT/ML IV SOLN
500.0000 [IU] | Freq: Once | INTRAVENOUS | Status: AC
  Administered 2022-11-22: 500 [IU] via INTRAVENOUS
  Filled 2022-11-22: qty 5

## 2022-11-22 MED ORDER — SODIUM CHLORIDE 0.9 % IV BOLUS
1000.0000 mL | Freq: Once | INTRAVENOUS | Status: AC
  Administered 2022-11-22: 1000 mL via INTRAVENOUS

## 2022-11-22 MED ORDER — OXYCODONE HCL 5 MG PO TABS
5.0000 mg | ORAL_TABLET | Freq: Four times a day (QID) | ORAL | 0 refills | Status: DC | PRN
  Filled 2022-11-22: qty 15, 4d supply, fill #0

## 2022-11-22 MED ORDER — SENNOSIDES-DOCUSATE SODIUM 8.6-50 MG PO TABS
1.0000 | ORAL_TABLET | Freq: Two times a day (BID) | ORAL | 1 refills | Status: DC
  Filled 2022-11-22: qty 60, 30d supply, fill #0

## 2022-11-22 NOTE — Progress Notes (Signed)
PROGRESS NOTE    Logan Ward  JAS:[id number] DOB: 1981/07/31 DOA: 11/10/2022 PCP: Pcp, No   Brief Narrative:  41 y.o. male with medical history of HIV/AIDS and syphilis presented with a rapidly growing mass in the right anterior chest wall.  On presentation, CT scan revealed 8 cm x 5.9 cm x 7.4 cm soft tissue mass with centralized necrosis extending anterior to the thyroid and into the upper chest with surrounding lymphadenopathy.  ID/cardiothoracic surgery/ENT/IR were consulted.  He underwent ultrasound-guided biopsy of the right neck mass by IR on 11/12/2022.  IV antibiotics were subsequently started after the biopsy.  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  Oncology was consulted.  Patient was transferred to [facility] 12/1 and planning to undergo bone marrow biopsy,  port placement, PET and initiate chemotherapy 12/4. PICC line placed 12/3 but since he was able to get his port placed, PICC line was subsequently removed.  He underwent bone marrow biopsy and port placement 12/4 and was started on chemotherapy 12/4.  Patient had lumbar with intrathecal Rituxan administration on 12/6.   Assessment & Plan:  Principal Problem:   Chest mass Active Problems:   AIDS (acquired immune deficiency syndrome) (HCC)   Goals of care, counseling/discussion   History of syphilis   Burkitt lymphoma of lymph nodes of multiple regions [facility])   Counseling regarding advance care planning and goals of care   Encounter for antineoplastic chemotherapy   Drug-induced constipation   Nausea without vomiting   Acute nonintractable headache       New diagnosis Burkitt's lymphoma, at least stage II -Status post ultrasound-guided biopsy of right neck mass by IR 11/28.  PICC line removed, Port-A-Cath in place.  No CNS involvement and port placement 12/4.  Started chemotherapy 12/4  -Final pathology consistent with B-cell lymphoma -Status post bone marrow biopsy - pending.  -Prophylactic  allopurinol for tumor lysis syndrome -Decadron twice daily -MRI brain is negative - PET scan shows hypermetabolic regions in cervical, neck and axillary mass consistent with high-grade lymphoma Underwent diagnostic lumbar puncture and intrathecal methotrexate on 12/6  Dizziness - Still feeling dizzy therefore we will give him another normal saline bolus followed by maintenance fluid   HIV/AIDS -On Biktarvy. Seen by ID - Bactrim for PJP prophylaxis    DVT prophylaxis: SCDs Start: 11/10/22 1746 Code Status: Full code Family Communication:    Status is: Inpatient Maintain hospital stay until cleared by oncology   Nutritional status    Signs/Symptoms: estimated needs  Interventions: Ensure Enlive (each supplement provides 350kcal and 20 grams of protein), Refer to RD note for recommendations  Body mass index is 37.14 kg/m.         Subjective: Tells me he still feels dizzy and nauseous this morning but tolerating p.o. intake for now.  Had some headache for which she received Tylenol. Examination:  Constitutional: Not in acute distress Respiratory: Clear to auscultation bilaterally Cardiovascular: Normal sinus rhythm, no rubs Abdomen: Nontender nondistended good bowel sounds Musculoskeletal: No edema noted Skin: No rashes seen Neurologic: CN 2-12 grossly intact.  And nonfocal Psychiatric: Normal judgment and insight. Alert and oriented x 3. Normal mood. Objective: Vitals:   11/21/22 0517 11/21/22 1543 11/21/22 2108 11/22/22 0627  BP: 109/66 112/60 112/70 124/69  Pulse: 61 65 70 73  Resp: _0 Temp: 98.4 F (36.9 C) 98.1 F (36.7 C) 98.6 F (37 C) 97.8 F (36.6 C)  TempSrc: Oral Oral Oral Oral  SpO2: 98% 100% 99% 97%  Weight:      Height:        Intake/Output Summary (Last 24 hours) at 11/22/2022 1117 Last data filed at 11/22/2022 0342 Gross per 24 hour  Intake 568.65 ml  Output --  Net 568.65 ml   Filed Weights   11/11/22 0730 11/11/22 1755   Weight: 123.4 kg 127.7 kg     Data Reviewed:   CBC: Recent Labs  Lab 11/18/22 0500 11/19/22 0545 11/20/22 0552 11/21/22 0455 11/22/22 0632  WBC 8.3 7.6 3.7* 4.5 3.7*  NEUTROABS 4.9 4.8 2.5 3.3 2.7  HGB 12.7* 12.7* 13.3 13.3 13.4  HCT 40.6 39.7 41.7 41.1 41.0  MCV 85.3 84.3 84.6 82.4 82.0  PLT 266 244 230 237 725   Basic Metabolic Panel: Recent Labs  Lab 11/18/22 0500 11/19/22 0545 11/20/22 0552 11/21/22 0455 11/22/22 0632  NA 133* 132* 129* 133* 129*  K 4.6 4.4 4.7 4.5 3.9  CL 102 101 98 100 97*  CO2 _0 GLUCOSE 109* 109* 122* 127* 115*  BUN _1 CREATININE 1.04 0.93 0.95 0.91 0.90  CALCIUM 9.1 8.9 8.6* 8.8* 8.6*  MG  --   --   --  2.7* 2.3  PHOS  --   --   --  4.6  --    GFR: Estimated Creatinine Clearance: 154.3 mL/min (by C-G formula based on SCr of 0.9 mg/dL). Liver Function Tests: Recent Labs  Lab 11/18/22 0500 11/19/22 0545 11/20/22 0552 11/21/22 0455 11/22/22 0632  AST _2 ALT _3 ALKPHOS 43 45 44 44 45  BILITOT 1.2 1.0 1.1 1.7* 1.8*  PROT 7.0 7.3 7.1 7.5 6.5  ALBUMIN 3.5 3.4* 3.4* 3.4* 3.3*   No results for input(s): "LIPASE", "AMYLASE" in the last 168 hours. No results for input(s): "AMMONIA" in the last 168 hours. Coagulation Profile: No results for input(s): "INR", "PROTIME" in the last 168 hours. Cardiac Enzymes: No results for input(s): "CKTOTAL", "CKMB", "CKMBINDEX", "TROPONINI" in the last 168 hours. BNP (last 3 results) No results for input(s): "PROBNP" in the last 8760 hours. HbA1C: No results for input(s): "HGBA1C" in the last 72 hours. CBG: Recent Labs  Lab 11/19/22 1403  GLUCAP 91   Lipid Profile: No results for input(s): "CHOL", "HDL", "LDLCALC", "TRIG", "CHOLHDL", "LDLDIRECT" in the last 72 hours. Thyroid Function Tests: No results for input(s): "TSH", "T4TOTAL", "FREET4", "T3FREE", "THYROIDAB" in the last 72 hours. Anemia Panel: No results for input(s): "VITAMINB12",  "FOLATE", "FERRITIN", "TIBC", "IRON", "RETICCTPCT" in the last 72 hours. Sepsis Labs: No results for input(s): "PROCALCITON", "LATICACIDVEN" in the last 168 hours.  Recent Results (from the past 240 hour(s))  Aerobic/Anaerobic Culture w Gram Stain (surgical/deep wound)     Status: None   Collection Time: 11/12/22 12:16 PM   Specimen: Tissue  Result Value Ref Range Status   Specimen Description TISSUE  Final   Special Requests NONE  Final   Gram Stain NO WBC SEEN NO ORGANISMS SEEN   Final   Culture   Final    No growth aerobically or anaerobically. Performed at [facility], [place] [address], [place], [place] [address]    Report Status 11/17/2022 FINAL  Final  Acid Fast Smear (AFB)     Status: None   Collection Time: 11/12/22 12:16 PM   Specimen: Soft Tissue Mass Excision  Result Value Ref Range Status   AFB Specimen Processing Concentration  Final  Acid Fast Smear Negative  Final    Comment: (NOTE) Performed At: [facility] [place], [place] [id number] Rush Farmer MD VC:[id number]    Source (AFB) TISSUE  Final    Comment: Performed at [facility], [place] [address], [place], [place] [address]  CSF culture w Gram Stain     Status: None (Preliminary result)   Collection Time: 11/20/22  1:14 PM   Specimen: PATH Cytology CSF; Cerebrospinal Fluid  Result Value Ref Range Status   Specimen Description   Final    CSF Performed at [place] [address], [place], [place] [address]    Special Requests   Final    NONE Performed at [facility], [place] [address], [place], [place] [address]    Gram Stain   Final    WBC PRESENT, PREDOMINANTLY MONONUCLEAR NO ORGANISMS SEEN Gram Stain Report Called to,Read Back By and Verified With: Inda Coke RN _0  on 12.6.2023 by [facility] Performed at [facility], [place] [address] [place],  [address]    Culture   Final    NO GROWTH 2  DAYS Performed at [place] [address] [place],  [address]    Report Status PENDING  Incomplete         Radiology Studies: DG Fluoro Guide Spinal/SI Jt Inj Left  Result Date: 11/20/2022 CLINICAL DATA:  Burkitt lymphoma, chemotherapy injection. EXAM: DIAGNOSTIC LUMBAR PUNCTURE UNDER FLUOROSCOPIC GUIDANCE COMPARISON:  None Available. FLUOROSCOPY: Radiation Exposure Index (as provided by the fluoroscopic device): 14.9 mGy Kerma PROCEDURE: Procedure was discussed with the patient including risks and benefits. Patient's questions were answered. Written informed consent for the procedure was obtained. Timeout protocol followed. L3-4 disc space was localized under C-arm fluoroscopy. Skin prepped and draped in usual sterile fashion. Skin and soft tissues anesthetized with  10 mL of 1% lidocaine. Under sterile technique, 20 gauge spinal needle advanced into spinal canal. Clear colorless CSF was encountered. 7 mL of CSF was obtained for requested analysis. Patient was then injected with 12 mg of methotrexate intrathecally. Procedure tolerated well by patient without immediate complication. IMPRESSION: Successful lumbar puncture with intrathecal methotrexate administration. Electronically Signed   By: Lorin Picket M.D.   On: 11/20/2022 14:08   DG FL GUIDED LUMBAR PUNCTURE  Result Date: 11/20/2022 Lorin Picket, MD     11/20/2022  2:08 PM CLINICAL DATA: [Burkitt lymphoma, chemotherapy injection.] EXAM: DIAGNOSTIC LUMBAR PUNCTURE UNDER FLUOROSCOPIC GUIDANCE COMPARISON: [None Available.] FLUOROSCOPY: Radiation Exposure Index (as provided by the fluoroscopic device): [14.9] [mGy Kerma] PROCEDURE:   Procedure was discussed with the patient including risks and benefits.  Patient's questions were answered. Written informed consent for the procedure was obtained. Timeout protocol followed.  [L3-4] disc space was localized under C-arm fluoroscopy.  Skin prepped and draped in usual sterile  fashion. Skin and soft tissues anesthetized with _1  10 mL of 1% lidocaine. Under sterile technique, [20 gauge] spinal needle advanced into spinal canal. Clear colorless CSF was encountered. 7 mL of CSF was obtained for requested analysis. Patient was then injected with 12 mg of methotrexate intrathecally. Procedure tolerated well by patient without immediate complication.   IMPRESSION: [Successful lumbar puncture with intrathecal methotrexate administration.] Lorin Picket, mD       Scheduled Meds:  acetaminophen  1,000 mg Oral Once   allopurinol  100 mg Oral BID   bictegravir-emtricitabine-tenofovir AF  1 tablet Oral Daily   Chlorhexidine Gluconate Cloth  6 each Topical  Daily   cyclophosphamide  750 mg/m2 (Treatment Plan Recorded) Intravenous Once   dexamethasone  6 mg Oral BID WC   DOXOrubicin (ADRIAMYCIN) 26 mg, etoposide (VEPESID) 128 mg, vinCRIStine (ONCOVIN) 1 mg in sodium chloride 0.9 % 1,000 mL chemo infusion   Intravenous Once   feeding supplement  237 mL Oral BID BM   lidocaine  5 mL Other Once   polyethylene glycol  17 g Oral Daily   prochlorperazine  10 mg Intravenous Once   senna-docusate  1 tablet Oral BID   sodium bicarbonate/sodium chloride   Mouth Rinse QID   sodium chloride flush  10-40 mL Intracatheter Q12H   sulfamethoxazole-trimethoprim  1 tablet Oral Daily   Continuous Infusions:  sodium chloride     ondansetron (ZOFRAN) 16 mg, dexamethasone (DECADRON) 20 mg in sodium chloride 0.9 % 50 mL IVPB     sodium chloride       LOS: 12 days   Time spent= 35 mins    Chaston Bradburn Arsenio Loader, MD [facility]  If 7PM-7AM, please contact night-coverage  11/22/2022, 11:17 AM

## 2022-11-22 NOTE — Discharge Summary (Signed)
Physician Discharge Summary  [name] BSW:[id number] DOB: 1981-01-02 DOA: 11/10/2022  PCP: Pcp, No  Admit date: 11/10/2022 Discharge date: 11/22/2022  Admitted From: HOME Disposition:  HOME  Recommendations for Outpatient Follow-up:  Follow up with PCP in 1-2 weeks Please obtain BMP/CBC in one week your next doctors visit.  Follow up OutPatient Oncology Appropriate medications prescribed by Dr Irene Limbo Outpatient ID follow up.    Discharge Condition: Stable CODE STATUS: Full Code Diet recommendation: Regular  Brief/Interim Summary: 41 y.o. male with medical history of HIV/AIDS and syphilis presented with a rapidly growing mass in the right anterior chest wall.  On presentation, CT scan revealed 8 cm x 5.9 cm x 7.4 cm soft tissue mass with centralized necrosis extending anterior to the thyroid and into the upper chest with surrounding lymphadenopathy.  ID/cardiothoracic surgery/ENT/IR were consulted.  He underwent ultrasound-guided biopsy of the right neck mass by IR on 11/12/2022.  IV antibiotics were subsequently started after the biopsy.  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  Oncology was consulted.  Patient was transferred to [facility] 12/1 and planning to undergo bone marrow biopsy,  port placement, PET and initiate chemotherapy 12/4. PICC line placed 12/3 but since he was able to get his port placed, PICC line was subsequently removed.  He underwent bone marrow biopsy and port placement 12/4 and was started on chemotherapy 12/4.  Patient had lumbar with intrathecal Rituxan administration on 12/6.    Cleared by Oncology for dc with outpatient follow up.     New diagnosis Burkitt's lymphoma, at least stage II -Status post ultrasound-guided biopsy of right neck mass by IR 11/28.  PICC line removed, Port-A-Cath in place.  No CNS involvement and port placement 12/4.  Started chemotherapy 12/4  -Final pathology consistent with B-cell lymphoma -Status post bone  marrow biopsy - pending.  -Prophylactic allopurinol for tumor lysis syndrome; not given upon dc per onc -Decadron twice daily -MRI brain is negative - PET scan shows hypermetabolic regions in cervical, neck and axillary mass consistent with high-grade lymphoma Underwent diagnostic lumbar puncture and intrathecal methotrexate on 12/6   Dizziness - Improved.    HIV/AIDS -On Biktarvy. Seen by ID follow up outpatient.  - Bactrim for PJP prophylaxis         Body mass index is 37.14 kg/m.       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 Dizziness better.   Discharge Exam: Vitals:   11/21/22 2108 11/22/22 0627  BP: 112/70 124/69  Pulse: 70 73  Resp: 18 18  Temp: 98.6 F (37 C) 97.8 F (36.6 C)  SpO2: 99% 97%   Vitals:   11/21/22 0517 11/21/22 1543 11/21/22 2108 11/22/22 0627  BP: 109/66 112/60 112/70 124/69  Pulse: 61 65 70 73  Resp: _0 Temp: 98.4 F (36.9 C) 98.1 F (36.7 C) 98.6 F (37 C) 97.8 F (36.6 C)  TempSrc: Oral Oral Oral Oral  SpO2: 98% 100% 99% 97%  Weight:  Height:          Discharge Instructions  Discharge Instructions     Clinic Appointment Request   Complete by: Nov 25, 2022    Contact your oncology clinic or infusion center to schedule this appointment.   infusion appointment request (330 min)   Complete by: Nov 25, 2022    Contact your oncology clinic or infusion center to schedule this appointment.      Allergies as of  11/22/2022   No Known Allergies      Medication List     TAKE these medications    Biktarvy 50-200-25 MG Tabs tablet Generic drug: bictegravir-emtricitabine-tenofovir AF Take 1 tablet by mouth daily.   dexamethasone 4 MG tablet Commonly known as: DECADRON Take 1 tablet (4 mg total) by mouth 2 (two) times daily with breakfast and lunch for 2 days after completion of chemotherapy   lidocaine-prilocaine cream Commonly known as: EMLA Apply to affected area once   ondansetron 8 MG tablet Commonly known as: ZOFRAN Take 1 tablet (8 mg total) by mouth every 8 (eight) hours as needed for nausea.   oxyCODONE 5 MG immediate release tablet Commonly known as: Oxy IR/ROXICODONE Take 1 tablet (5 mg total) by mouth every 6 (six) hours as needed for moderate pain or severe pain.   polyethylene glycol 17 g packet Commonly known as: MIRALAX / GLYCOLAX Take 1 packet (17 g) by mouth daily. Start taking on: November 23, 2022   senna-docusate 8.6-50 MG tablet Commonly known as: Senokot-S Take 1 tablet by mouth 2 (two) times daily.   sulfamethoxazole-trimethoprim 800-160 MG tablet Commonly known as: BACTRIM DS Take 1 tablet by mouth daily.        No Known Allergies  You were cared for by a hospitalist during your hospital stay. If you have any questions about your discharge medications or the care you received while you were in the hospital after you are discharged, you can call the unit and asked to speak with the hospitalist on call if the hospitalist that took care of you is not available. Once you are discharged, your primary care physician will handle any further medical issues. Please note that no refills for any discharge medications will be authorized once you are discharged, as it is imperative that you return to your primary care physician (or establish a relationship with a primary care physician if you do not have one) for your aftercare needs so that they can reassess your need  for medications and monitor your lab values.   Procedures/Studies: DG Fluoro Guide Spinal/SI Jt Inj Left  Result Date: 11/20/2022 CLINICAL DATA:  Burkitt lymphoma, chemotherapy injection. EXAM: DIAGNOSTIC LUMBAR PUNCTURE UNDER FLUOROSCOPIC GUIDANCE COMPARISON:  None Available. FLUOROSCOPY: Radiation Exposure Index (as provided by the fluoroscopic device): 14.9 mGy Kerma PROCEDURE: Procedure was discussed with the patient including risks and benefits. Patient's questions were answered. Written informed consent for the procedure was obtained. Timeout protocol followed. L3-4 disc space was localized under C-arm fluoroscopy. Skin prepped and draped in usual sterile fashion. Skin and soft tissues anesthetized with  10 mL of 1% lidocaine. Under sterile technique, 20 gauge spinal needle advanced into spinal canal. Clear colorless CSF was encountered. 7 mL of CSF was obtained for requested analysis. Patient was then injected with 12 mg of methotrexate intrathecally. Procedure tolerated well by patient without immediate complication. IMPRESSION: Successful lumbar puncture with intrathecal methotrexate administration. Electronically Signed   By: Lorin Picket M.D.   On: 11/20/2022 14:08   DG  FL GUIDED LUMBAR PUNCTURE  Result Date: 11/20/2022 Lorin Picket, MD     11/20/2022  2:08 PM CLINICAL DATA: [Burkitt lymphoma, chemotherapy injection.] EXAM: DIAGNOSTIC LUMBAR PUNCTURE UNDER FLUOROSCOPIC GUIDANCE COMPARISON: [None Available.] FLUOROSCOPY: Radiation Exposure Index (as provided by the fluoroscopic device): [14.9] [mGy Kerma] PROCEDURE:   Procedure was discussed with the patient including risks and benefits.  Patient's questions were answered. Written informed consent for the procedure was obtained. Timeout protocol followed.  [L3-4] disc space was localized under C-arm fluoroscopy.  Skin prepped and draped in usual sterile fashion. Skin and soft tissues anesthetized with _0  10 mL of 1% lidocaine. Under sterile  technique, [20 gauge] spinal needle advanced into spinal canal. Clear colorless CSF was encountered. 7 mL of CSF was obtained for requested analysis. Patient was then injected with 12 mg of methotrexate intrathecally. Procedure tolerated well by patient without immediate complication.   IMPRESSION: [Successful lumbar puncture with intrathecal methotrexate administration.] Lorin Picket, mD  MR BRAIN W WO CONTRAST  Result Date: 11/19/2022 CLINICAL DATA:  Lymphoma staging. EXAM: MRI HEAD WITHOUT AND WITH CONTRAST TECHNIQUE: Multiplanar, multiecho pulse sequences of the brain and surrounding structures were obtained without and with intravenous contrast. CONTRAST:  44m GADAVIST GADOBUTROL 1 MMOL/ML IV SOLN COMPARISON:  None Available. FINDINGS: Brain: No acute infarction, hemorrhage, hydrocephalus, extra-axial collection or mass lesion. Vascular: Normal flow voids. Skull and upper cervical spine: Normal marrow signal. Sinuses/Orbits: Negative. Other: None. IMPRESSION: No evidence of intracranial metastatic disease. Electronically Signed   By: HMarin RobertsM.D.   On: 11/19/2022 16:17   NM PET Image Initial (PI) Skull Base To Thigh (F-18 FDG)  Result Date: 11/19/2022 CLINICAL DATA:  Initial treatment strategy for Burkitt's lymphoma. Initiation of chemotherapy same day. EXAM: NUCLEAR MEDICINE PET SKULL BASE TO THIGH TECHNIQUE: 14.3 mCi F-18 FDG was injected intravenously. Full-ring PET imaging was performed from the skull base to thigh after the radiotracer. CT data was obtained and used for attenuation correction and anatomic localization. Fasting blood glucose: 14.3 mg/dl COMPARISON:  None Available. FINDINGS: Mediastinal blood pool activity: SUV max 2.7 Liver activity: SUV max 3.8 NECK: A mat of intensely hypermetabolic RIGHT enlarged cervical lymph nodes involve the RIGHT level 2, level 3 and RIGHT supraclavicular nodes. Nodal mass beneath the sternocleidomastoid muscle measures 27 mm on image 40 with SUV  max equal 20.23 RIGHT neck mass superficial to the sternocleidomastoid muscle extends to the skin surface above the RIGHT clavicle measuring 5.1 cm with SUV max equal 33 point 1 (image 44). Incidental CT findings: None. CHEST: Hypermetabolic RIGHT jugular nodal conglomerateextends into the upper thoracic inlet and RIGHT upper mediastinum with SUV max equal 17 on image 50. No lower paratracheal adenopathy.  No hilar adenopathy. There are hypermetabolic RIGHT axillary nodes which are relatively small but intensely hypermetabolic. For example 8 mm node on image 58/4 with SUV max equal 8.6. Incidental CT findings: No suspicious pulmonary nodules. Port in the anterior chest wall with tip in distal SVC. ABDOMEN/PELVIS: No abnormal hypermetabolic activity within the liver, pancreas, adrenal glands, or spleen. No hypermetabolic lymph nodes in the abdomen or pelvis. Spleen is normal volume and normal metabolic activity. Activity along the anterior LEFT chest wall related to port ejection is incidental/artifactual. Incidental CT findings: None. SKELETON: No abnormal marrow activity suggest lymphoma involvement. Incidental CT findings: None. IMPRESSION: 1. Enlarged intense hypermetabolic RIGHT cervical nodal mass extending from the level 2 nodal station to the RIGHT supraclavicular nodal station. Findings consistent high-grade lymphoma 2. Superficial RIGHT neck mass extends  to the skin surface superficial to the RIGHT sternocleidomastoid muscle. Intense metabolic activity consistent with high-grade lymphoma. 3. Hypermetabolic small RIGHT axillary nodal disease. 4. No evidence of spleen involvement or solid organ involvement below the diaphragms. 5. No evidence of marrow involvement. Electronically Signed   By: Suzy Bouchard M.D.   On: 11/19/2022 16:02   IR IMAGING GUIDED PORT INSERTION  Result Date: 11/18/2022 INDICATION: 41 year old male with history of Burkitt lymphoma requiring central venous access for chemotherapy  administration. EXAM: IMPLANTED PORT A CATH PLACEMENT WITH ULTRASOUND AND FLUOROSCOPIC GUIDANCE COMPARISON:  None Available. MEDICATIONS: None. ANESTHESIA/SEDATION: Moderate (conscious) sedation was employed during this procedure. A total of Versed 2 mg and Fentanyl 100 mcg was administered intravenously. Moderate Sedation Time: 22 minutes. The patient's level of consciousness and vital signs were monitored continuously by radiology nursing throughout the procedure under my direct supervision. CONTRAST:  None FLUOROSCOPY TIME:  Three mGy COMPLICATIONS: None immediate. PROCEDURE: The procedure, risks, benefits, and alternatives were explained to the patient. Questions regarding the procedure were encouraged and answered. The patient understands and consents to the procedure. The left neck and chest were prepped with chlorhexidine in a sterile fashion, and a sterile drape was applied covering the operative field. Maximum barrier sterile technique with sterile gowns and gloves were used for the procedure. A timeout was performed prior to the initiation of the procedure. Ultrasound was used to examine the jugular vein which was compressible and free of internal echoes. A skin marker was used to demarcate the planned venotomy and port pocket incision sites. Local anesthesia was provided to these sites and the subcutaneous tunnel track with 1% lidocaine with 1:100,000 epinephrine. A small incision was created at the jugular access site and blunt dissection was performed of the subcutaneous tissues. Under ultrasound guidance, the jugular vein was accessed with a 21 ga micropuncture needle and an 0.018" wire was inserted to the superior vena cava. Real-time ultrasound guidance was utilized for vascular access including the acquisition of a permanent ultrasound image documenting patency of the accessed vessel. A 5 Fr micopuncture set was then used, through which a 0.035" Rosen wire was passed under fluoroscopic guidance into  the inferior vena cava. An 8 Fr dilator was then placed over the wire. A subcutaneous port pocket was then created along the upper chest wall utilizing a combination of sharp and blunt dissection. The pocket was irrigated with sterile saline, packed with gauze, and observed for hemorrhage. A single lumen "ISP" sized power injectable port was chosen for placement. The 8 Fr catheter was tunneled from the port pocket site to the venotomy incision. The port was placed in the pocket. The external catheter was trimmed to appropriate length. The dilator was exchanged for an 8 Fr peel-away sheath under fluoroscopic guidance. The catheter was then placed through the sheath and the sheath was removed. Final catheter positioning was confirmed and documented with a fluoroscopic spot radiograph. The port was accessed with a Huber needle, aspirated, and flushed with heparinized saline. The deep dermal layer of the port pocket incision was closed with interrupted 3-0 Vicryl suture. The skin was opposed with a running subcuticular 4-0 Monocryl suture. Dermabond was then placed over the port pocket and neck incisions. The patient tolerated the procedure well without immediate post procedural complication. FINDINGS: After catheter placement, the tip lies within the superior cavoatrial junction. The catheter aspirates and flushes normally and is ready for immediate use. IMPRESSION: Successful placement of a power injectable Port-A-Cath via the left internal jugular  vein. The catheter is ready for immediate use. Ruthann Cancer, MD [facility] Radiology Electronically Signed   By: Ruthann Cancer M.D.   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 EXAM: CT-GUIDED BONE MARROW BIOPSY AND ASPIRATION MEDICATIONS: None ANESTHESIA/SEDATION: Fentanyl 100 mcg IV; Versed 2 mg IV Sedation Time: 16  minutes; The patient was continuously monitored during the procedure by the interventional radiology nurse under my direct supervision. COMPLICATIONS: None immediate. PROCEDURE: Informed consent was obtained from the patient following an explanation of the procedure, risks, benefits and alternatives. The patient understands, agrees and consents for the procedure. All questions were addressed. A time out was performed prior to the initiation of the procedure. The patient was positioned prone and non-contrast localization CT was performed of the pelvis to demonstrate the iliac marrow spaces. The operative site was prepped and draped in the usual sterile fashion. Under sterile conditions and local anesthesia, a 22 gauge spinal needle was utilized for procedural planning. Next, an 11 gauge coaxial bone biopsy needle was advanced into the right iliac marrow space. Needle position was confirmed with CT imaging. Initially, a bone marrow aspiration was performed. Next, a bone marrow biopsy was obtained with the 11 gauge outer bone marrow device. Samples were prepared with the cytotechnologist and deemed adequate. The needle was removed and superficial hemostasis was obtained with manual compression. A dressing was applied. The patient tolerated the procedure well without immediate post procedural complication. IMPRESSION: Successful CT guided right iliac bone marrow aspiration and core biopsy. Ruthann Cancer, MD [facility] Radiology Electronically Signed   By: Ruthann Cancer M.D.   On: 11/18/2022 11:34   [place] EKG SITE RITE  Result Date: 11/15/2022 If Site Rite image not attached, placement could not be confirmed due to current cardiac rhythm.  ECHOCARDIOGRAM COMPLETE  Result Date: 11/14/2022    ECHOCARDIOGRAM REPORT   Patient Name:   ANDRW MCGUIRT Date of Exam: 11/14/2022 Medical Rec #:  [id number]      Height:       73.0 in Accession #:    [id number]     Weight:        281.5 lb Date of Birth:  22-Feb-1981       BSA:          2.488 m Patient Age:    11 years       BP:           129/79 mmHg Patient Gender: M              HR:           75 bpm. Exam Location:  Inpatient Procedure: 2D Echo, Color Doppler, Cardiac Doppler and Intracardiac            Opacification Agent Indications:    Chemo Z09  History:        Patient has no prior history of Echocardiogram examinations.                 Chest Mass; Signs/Symptoms:Shortness of Breath.  Sonographer:    Greer Pickerel Referring Phys: [id number] Brunetta Genera  Sonographer Comments: Image acquisition challenging due to patient body habitus and Image acquisition challenging due to respiratory motion. Global longitudinal strain was attempted. IMPRESSIONS  1. Left ventricular ejection fraction, by estimation, is 60 to 65%. The left ventricle has normal function. The left ventricle  has no regional wall motion abnormalities. There is mild left ventricular hypertrophy. Left ventricular diastolic parameters were normal.  2. Right ventricular systolic function is normal. The right ventricular size is normal. Tricuspid regurgitation signal is inadequate for assessing PA pressure.  3. The mitral valve is normal in structure. No evidence of mitral valve regurgitation.  4. The aortic valve is tricuspid. Aortic valve regurgitation is not visualized.  5. The inferior vena cava is normal in size with greater than 50% respiratory variability, suggesting right atrial pressure of 3 mmHg. Comparison(s): No prior Echocardiogram. FINDINGS  Left Ventricle: Left ventricular ejection fraction, by estimation, is 60 to 65%. The left ventricle has normal function. The left ventricle has no regional wall motion abnormalities. Definity contrast agent was given IV to delineate the left ventricular  endocardial borders. The left ventricular internal cavity size was normal in size. There is mild left ventricular hypertrophy. Left ventricular diastolic parameters were  normal. Right Ventricle: The right ventricular size is normal. No increase in right ventricular wall thickness. Right ventricular systolic function is normal. Tricuspid regurgitation signal is inadequate for assessing PA pressure. Left Atrium: Left atrial size was normal in size. Right Atrium: Right atrial size was normal in size. Pericardium: There is no evidence of pericardial effusion. Mitral Valve: The mitral valve is normal in structure. No evidence of mitral valve regurgitation. Tricuspid Valve: The tricuspid valve is normal in structure. Tricuspid valve regurgitation is not demonstrated. Aortic Valve: The aortic valve is tricuspid. Aortic valve regurgitation is not visualized. Pulmonic Valve: Pulmonic valve regurgitation is not visualized. Aorta: The aortic root and ascending aorta are structurally normal, with no evidence of dilitation. Venous: The inferior vena cava is normal in size with greater than 50% respiratory variability, suggesting right atrial pressure of 3 mmHg. IAS/Shunts: No atrial level shunt detected by color flow Doppler.  LEFT VENTRICLE PLAX 2D LVIDd:         3.60 cm   Diastology LVIDs:         2.40 cm   LV e' medial:    6.85 cm/s LV PW:         1.40 cm   LV E/e' medial:  13.3 LV IVS:        0.80 cm   LV e' lateral:   12.70 cm/s LVOT diam:     2.10 cm   LV E/e' lateral: 7.1 LV SV:         77 LV SV Index:   31 LVOT Area:     3.46 cm  RIGHT VENTRICLE RV S prime:     17.70 cm/s TAPSE (M-mode): 2.8 cm LEFT ATRIUM           Index        RIGHT ATRIUM           Index LA diam:      3.10 cm 1.25 cm/m   RA Area:     16.80 cm LA Vol (A2C): 46.4 ml 18.65 ml/m  RA Volume:   40.80 ml  16.40 ml/m LA Vol (A4C): 36.6 ml 14.71 ml/m  AORTIC VALVE LVOT Vmax:   134.00 cm/s LVOT Vmean:  81.400 cm/s LVOT VTI:    0.223 m  AORTA Ao Root diam: 3.80 cm Ao Asc diam:  3.20 cm MITRAL VALVE MV Area (PHT): 3.63 cm    SHUNTS MV Decel Time: 209 msec    Systemic VTI:  0.22 m MR Peak grad: 3.7 mmHg     Systemic Diam:  2.10 cm  MR Vmax:      96.60 cm/s MV E velocity: 90.80 cm/s MV A velocity: 76.70 cm/s MV E/A ratio:  1.18 Landscape architect signed by Phineas Inches Signature Date/Time: 11/14/2022/4:58:35 PM    Final    [place] CORE BIOPSY (SOFT TISSUE)  Result Date: 11/12/2022 INDICATION: 41 year old gentleman with history of lymphoma presents to IR for biopsy of right neck mass EXAM: Ultrasound-guided biopsy of right neck mass MEDICATIONS: None. ANESTHESIA/SEDATION: Moderate (conscious) sedation was employed during this procedure. A total of Versed 2.5 mg and Fentanyl 75 mcg was administered intravenously. Moderate Sedation Time: 10 minutes. The patient's level of consciousness and vital signs were monitored continuously by radiology nursing throughout the procedure under my direct supervision. COMPLICATIONS: None immediate. PROCEDURE: Informed written consent was obtained from the patient after a thorough discussion of the procedural risks, benefits and alternatives. All questions were addressed. Maximal Sterile Barrier Technique was utilized including caps, mask, sterile gowns, sterile gloves, sterile drape, hand hygiene and skin antiseptic. A timeout was performed prior to the initiation of the procedure. Patient position supine on the ultrasound table. Right neck skin prepped and draped in usual sterile fashion. Following local lidocaine administration, 5- 18 gauge cores were obtained from the right neck mass utilizing continuous ultrasound guidance. Two cores were sent for Gram stain and culture in sterile saline. Three cores were sent for histologic analysis in formalin. Needle removed and hemostasis achieved with 5 minutes of manual compression. Post procedure ultrasound images showed no evidence of significant hemorrhage. IMPRESSION: Ultrasound-guided biopsy of right neck mass. Electronically Signed   By: Miachel Roux M.D.   On: 11/12/2022 17:17   CT Soft Tissue Neck W Contrast  Result Date: 11/10/2022 CLINICAL  DATA:  Neck mass with history of HIV. EXAM: CT NECK WITH CONTRAST TECHNIQUE: Multidetector CT imaging of the neck was performed using the standard protocol following the bolus administration of intravenous contrast. RADIATION DOSE REDUCTION: This exam was performed according to the departmental dose-optimization program which includes automated exposure control, adjustment of the mA and/or kV according to patient size and/or use of iterative reconstruction technique. CONTRAST:  162m OMNIPAQUE IOHEXOL 350 MG/ML SOLN COMPARISON:  CT neck 6 days ago. FINDINGS: Pharynx and larynx: The nasal cavity and nasopharynx are unremarkable. The oral cavity and oropharynx are unremarkable. The parapharyngeal spaces are clear. The hypopharynx and larynx are unremarkable. The vocal folds are normal in appearance. There is no retropharyngeal fluid collection.  The airway is patent. Salivary glands: The parotid and submandibular glands are unremarkable. Thyroid: Unremarkable. Lymph nodes: There is enlarged centrally necrotic right level I lymph node measuring up to 2.1 cm in short axis, similar to the CT from 6 days ago. The large infiltrative centrally necrotic soft tissue mass throughout the right neck measuring up to approximately 8.1 cm TV x 5.9 cm AP by 7.4 cm cc is also not significantly changed in size. Inferior extent into the upper chest anterior to the thyroid with centrally necrotic appearing tissue measuring 4.9 cm x 5.4 cm is unchanged when measured again using similar technique. Overlying skin thickening is similar. The mass is inseparable from the sternocleidomastoid muscle. There are numerous additional prominent right-sided cervical chain lymph nodes measuring up to 1.0 cm at level IIA (3-38), and 1.1 cm in short axis in the posterior triangle (3-43). Vascular: The major vasculature of the neck is unremarkable. Limited intracranial: The imaged portions of the posterior fossa are unremarkable. Visualized orbits: Not  included within the field of view. Mastoids  and visualized paranasal sinuses: Clear. Skeleton: There is no acute osseous abnormality or suspicious osseous lesion. Upper chest: Assessed on the separately dictated CT chest. Other: None. IMPRESSION: No significant interval change since the CT neck from 6 days prior. Unchanged infiltrative soft tissue mass in the right neck extending to the upper chest anterior to the clavicle with central necrosis and surrounding lymphadenopathy. Differential again includes malignancy (sarcoma, lymphoma), or infection (including TB. Sampling is recommended. Electronically Signed   By: Valetta Mole M.D.   On: 11/10/2022 16:27   CT CHEST ABDOMEN PELVIS W CONTRAST  Result Date: 11/10/2022 CLINICAL DATA:  Lymphoma, neck mass, metastatic disease evaluation * Tracking Code: BO * EXAM: CT CHEST, ABDOMEN, AND PELVIS WITH CONTRAST TECHNIQUE: Multidetector CT imaging of the chest, abdomen and pelvis was performed following the standard protocol during bolus administration of intravenous contrast. RADIATION DOSE REDUCTION: This exam was performed according to the departmental dose-optimization program which includes automated exposure control, adjustment of the mA and/or kV according to patient size and/or use of iterative reconstruction technique. CONTRAST:  161m OMNIPAQUE IOHEXOL 350 MG/ML SOLN COMPARISON:  CT neck, 11/04/2022, CT pelvis, 02/09/2017 FINDINGS: CT CHEST FINDINGS Cardiovascular: No significant vascular findings. Normal heart size. No pericardial effusion. Mediastinum/Nodes: Enlarged right lower cervical, supraclavicular, and bilateral axillary lymph nodes, largest right axillary nodes measuring up to 2.2 x 1.7 cm (series 3, image 18). Diffuse fat stranding throughout the superior mediastinum (series 3, image 19), thyroid gland, trachea, and esophagus demonstrate no significant findings. Lungs/Pleura: Lungs are clear. No pleural effusion or pneumothorax. Musculoskeletal:  Partially imaged, heterogeneous mass centered in the anterior right neck, incompletely imaged and better assessed by simultaneous dedicated examination of the neck (series 3, image 1). No acute osseous findings. CT ABDOMEN PELVIS FINDINGS Hepatobiliary: No solid liver abnormality is seen. No gallstones, gallbladder wall thickening, or biliary dilatation. Pancreas: Unremarkable. No pancreatic ductal dilatation or surrounding inflammatory changes. Spleen: Normal in size without significant abnormality. Adrenals/Urinary Tract: Adrenal glands are unremarkable. Kidneys are normal, without renal calculi, solid lesion, or hydronephrosis. Bladder is unremarkable. Stomach/Bowel: Stomach is within normal limits. Appendix appears normal. No evidence of bowel wall thickening, distention, or inflammatory changes. Vascular/Lymphatic: No significant vascular findings are present. Prominent subcentimeter retroperitoneal and iliac lymph nodes, for example a right external iliac node measuring 1.1 x 0.8 cm (series 3, image 102). Reproductive: No mass or other abnormality. Other: No abdominal wall hernia or abnormality. No ascites. Diffuse retroperitoneal fat stranding (series 3, image 80). Musculoskeletal: No acute osseous findings. IMPRESSION: 1. Partially imaged, heterogeneous mass centered in the anterior right neck, incompletely imaged and better assessed by simultaneous dedicated examination of the neck. As previously reported, general differential considerations include both malignancy and infectious process such as scrofula. 2. Enlarged right lower cervical, supraclavicular, and bilateral axillary lymph nodes, which may be reactive although are generally concerning for metastatic disease. 3. Prominent subcentimeter retroperitoneal and iliac lymph nodes, nonspecific although as above concerning for additional nodal metastatic disease. 4. Diffuse fat stranding throughout the superior mediastinum, of uncertain nature, possibly  reflecting mediastinal infectious involvement or alternately vascular/lymphatic congestion. Prior thoracic radiotherapy could likewise have this appearance. 5. Diffuse retroperitoneal fat stranding, as above of uncertain significance, possibly infectious or inflammatory, for example incidental and related to pancreatitis, or as above perhaps related to prior abdominal radiotherapy. 6. No mass or evidence of organ metastatic disease in the abdomen or pelvis. Electronically Signed   By: ADelanna AhmadiM.D.   On: 11/10/2022 16:26   DG  Chest 2 View  Result Date: 11/10/2022 CLINICAL DATA:  Dyspnea EXAM: CHEST - 2 VIEW COMPARISON:  06/27/2016 FINDINGS: Small areas of consolidation or volume loss identified right middle lobe and lingula. No pneumothorax or pleural effusion. Normal pulmonary vasculature. Unremarkable cardiac silhouette. Unremarkable osseous structures. IMPRESSION: Medial bibasilar subsegmental atelectasis or consolidation. Electronically Signed   By: Sammie Bench M.D.   On: 11/10/2022 14:53   CT SOFT TISSUE NECK W CONTRAST  Result Date: 11/05/2022 CLINICAL DATA:  Neck mass. EXAM: CT NECK WITH CONTRAST TECHNIQUE: Multidetector CT imaging of the neck was performed using the standard protocol following the bolus administration of intravenous contrast. RADIATION DOSE REDUCTION: This exam was performed according to the departmental dose-optimization program which includes automated exposure control, adjustment of the mA and/or kV according to patient size and/or use of iterative reconstruction technique. CONTRAST:  22m OMNIPAQUE IOHEXOL 350 MG/ML SOLN COMPARISON:  CT Neck 07/23/22 FINDINGS: Pharynx and larynx: Normal. No mass or swelling. Salivary glands: No inflammation, mass, or stone. Thyroid: Normal. Lymph nodes: There are prominent lymph nodes, for example a 1.2 cm level 1 a lymph node, unchanged from prior exam. There is also a prominent 8 mm right level 3 lymph, slightly increased in size from  prior exam. Along the inferior aspect of the right submandibular gland there is a 2.8 x 1.8 cm lesion with central hypodensity (series 3, image 56) which could represent a centrally necrotic lymph node. Vascular: There is mass effect on the right internal jugular vein (series 3, image 68). Limited intracranial: Negative. Visualized orbits: Negative. Mastoids and visualized paranasal sinuses: Clear. Skeleton: No acute or aggressive process. Upper chest: See below.  No focal pulmonary nodule is visualized. Other: There is marked interval worsening in the degree soft tissue abnormality seen in the right neck compared to prior exam dated 07/23/2022. There is now a large soft tissue mass along the anterior chest wall, adjacent to the sternoclavicular joint on the right measuring 4.2 x 3 4 x 5.6 cm (series 3, image 5). There is infiltrative soft tissue surrounding this mass that extends superiorly along the supraclavicular region to the level of the hyoid bone and submandibular region (series 3, image 64). Inferiorly this mass likely now also extends into the mediastinum where there is soft tissue stranding (series 3, image 115). Infiltrative soft tissues also seen along the left supraclavicular region (series 3, image 77), but to a lesser degree than the right. Portions of this mass appear to have a cutaneous component, for example along the right aspect of the chin (series 3, image 59), as well as the portion adjacent to the right sternoclavicular joint (series 3, image 79). IMPRESSION: 1. Marked interval worsening in the degree of soft tissue abnormality seen in the right neck compared to prior exam dated 07/23/2022. There is now a multi spatial infiltrative soft tissue mass involving a large portion of the right neck. Inferiorly this mass extends into the mediastinum and superiorly this mass extends to the submandibular region. Infiltrative soft tissue is also seen along the left supraclavicular region. Portions of  this mass appear to have a cutaneous component, for example along the right aspect of the chin, as well as the right sternoclavicular joint. Findings are concerning for malignancy, with differential considerations including lymphoma or sarcoma. Tissue sampling is recommended. Additionally CT of the chest is also recommended to fully characterize the mediastinal extent of this mass. 2. 2.8 x 1.8 cm lesion with central hypodensity along the inferior aspect of the right  submandibular gland could represent a centrally necrotic lymph node. Electronically Signed   By: Marin Roberts M.D.   On: 11/05/2022 10:29     The results of significant diagnostics from this hospitalization (including imaging, microbiology, ancillary and laboratory) are listed below for reference.     Microbiology: Recent Results (from the past 240 hour(s))  CSF culture w Gram Stain     Status: None (Preliminary result)   Collection Time: 11/20/22  1:14 PM   Specimen: PATH Cytology CSF; Cerebrospinal Fluid  Result Value Ref Range Status   Specimen Description   Final    CSF Performed at [facility] [address] [place], [place] [address]    Special Requests   Final    NONE Performed at [facility], [place] [address], [place], [place] [address]    Gram Stain   Final    WBC PRESENT, PREDOMINANTLY MONONUCLEAR NO ORGANISMS SEEN Gram Stain Report Called to,Read Back By and Verified With: Inda Coke RN _0  on 12.6.2023 by [facility] Performed at [facility], [place] [address], [place], [place] [address]    Culture   Final    NO GROWTH 2 DAYS Performed at [place] [facility] [address], [place],  [address]    Report Status PENDING  Incomplete     Labs: BNP (last 3 results) No results for input(s): "BNP" in the last 8760 hours. Basic Metabolic Panel: Recent Labs  Lab 11/18/22 0500 11/19/22 0545 11/20/22 0552 11/21/22 0455 11/22/22 0632  NA 133* 132*  129* 133* 129*  K 4.6 4.4 4.7 4.5 3.9  CL 102 101 98 100 97*  CO2 _1 GLUCOSE 109* 109* 122* 127* 115*  BUN _2 CREATININE 1.04 0.93 0.95 0.91 0.90  CALCIUM 9.1 8.9 8.6* 8.8* 8.6*  MG  --   --   --  2.7* 2.3  PHOS  --   --   --  4.6  --    Liver Function Tests: Recent Labs  Lab 11/18/22 0500 11/19/22 0545 11/20/22 0552 11/21/22 0455 11/22/22 0632  AST _3 ALT _4 ALKPHOS 43 45 44 44 45  BILITOT 1.2 1.0 1.1 1.7* 1.8*  PROT 7.0 7.3 7.1 7.5 6.5  ALBUMIN 3.5 3.4* 3.4* 3.4* 3.3*   No results for input(s): "LIPASE", "AMYLASE" in the last 168 hours. No results for input(s): "AMMONIA" in the last 168 hours. CBC: Recent Labs  Lab 11/18/22 0500 11/19/22 0545 11/20/22 0552 11/21/22 0455 11/22/22 0632  WBC 8.3 7.6 3.7* 4.5 3.7*  NEUTROABS 4.9 4.8 2.5 3.3 2.7  HGB 12.7* 12.7* 13.3 13.3 13.4  HCT 40.6 39.7 41.7 41.1 41.0  MCV 85.3 84.3 84.6 82.4 82.0  PLT 266 244 230 237 245   Cardiac Enzymes: No results for input(s): "CKTOTAL", "CKMB", "CKMBINDEX", "TROPONINI" in the last 168 hours. BNP: Invalid input(s): "POCBNP" CBG: Recent Labs  Lab 11/19/22 1403  GLUCAP 91   D-Dimer No results for input(s): "DDIMER" in the last 72 hours. Hgb A1c No results for input(s): "HGBA1C" in the last 72 hours. Lipid Profile No results for input(s): "CHOL", "HDL", "LDLCALC", "TRIG", "CHOLHDL", "LDLDIRECT" in the last 72 hours. Thyroid function studies No results for input(s): "TSH", "T4TOTAL", "T3FREE", "THYROIDAB" in the last 72 hours.  Invalid input(s): "FREET3" Anemia work up No results for input(s): "VITAMINB12", "FOLATE", "FERRITIN", "TIBC", "IRON", "RETICCTPCT" in the last 72 hours.  Urinalysis    Component Value Date/Time   COLORURINE YELLOW 06/07/2022 [place] 06/07/2022 1545   LABSPEC 1.025 06/07/2022 1545   PHURINE 6.0 06/07/2022 1545   GLUCOSEU NEGATIVE 06/07/2022 1545   HGBUR NEGATIVE 06/07/2022 1545    BILIRUBINUR NEGATIVE 06/07/2022 1545   [place] 06/07/2022 1545   PROTEINUR NEGATIVE 06/07/2022 1545   UROBILINOGEN 1.0 09/02/2013 0854   NITRITE NEGATIVE 06/07/2022 1545   LEUKOCYTESUR NEGATIVE 06/07/2022 1545   Sepsis Labs Recent Labs  Lab 11/19/22 0545 11/20/22 0552 11/21/22 0455 11/22/22 0632  WBC 7.6 3.7* 4.5 3.7*   Microbiology Recent Results (from the past 240 hour(s))  CSF culture w Gram Stain     Status: None (Preliminary result)   Collection Time: 11/20/22  1:14 PM   Specimen: PATH Cytology CSF; Cerebrospinal Fluid  Result Value Ref Range Status   Specimen Description   Final    CSF Performed at [facility], [place] [address], [place], [place] [address]    Special Requests   Final    NONE Performed at [facility], [place] [address], [place], [place] [address]    Gram Stain   Final    WBC PRESENT, PREDOMINANTLY MONONUCLEAR NO ORGANISMS SEEN Gram Stain Report Called to,Read Back By and Verified With: Inda Coke RN _0  on 12.6.2023 by [facility] Performed at [facility], [place] [address] [place], [place] [address]    Culture   Final    NO GROWTH 2 DAYS Performed at [place] [address], [place], [place] [address]    Report Status PENDING  Incomplete     Time coordinating discharge:  I have spent 35 minutes face to face with the patient and on the ward discussing the patients care, assessment, plan and disposition with other care givers. >50% of the time was devoted counseling the patient about the risks and benefits of treatment/Discharge disposition and coordinating care.   SIGNED:   Damita Lack, MD  [facility] 11/22/2022, 4:35 PM   If 7PM-7AM, please contact night-coverage

## 2022-11-22 NOTE — Progress Notes (Addendum)
HEMATOLOGY/ONCOLOGY INPATIENT PROGRESS NOTE  Date of Service: 11/22/22   Inpatient Attending: .Damita Lack, MD   SUBJECTIVE  Patient was seen in medical oncology follow-up. He is receiving cycle 1 day 5 of dose adjusted EPOCH-R chemotherapy.  Overall, he is tolerating treatment well. He was resting comfortably on arrival to the room. Spinal tap results unremarkable for cancer. He is a CNA at a nursing home and given his increased risk for infections he is planning to apply for long-term disability. He was given card with his appointments for 11/25/2022 for Udenyca and Rituxan. Nausea resolved. Mild headache much improved from yesterday.  OBJECTIVE:  PHYSICAL EXAMINATION: NAD  Vitals:   11/21/22 0517 11/21/22 1543 11/21/22 2108 11/22/22 0627  BP: 109/66 112/60 112/70 124/69  Pulse: 61 65 70 73  Resp: _0 Temp: 98.4 F (36.9 C) 98.1 F (36.7 C) 98.6 F (37 C) 97.8 F (36.6 C)  TempSrc: Oral Oral Oral Oral  SpO2: 98% 100% 99% 97%  Weight:      Height:       Filed Weights   11/11/22 0730 11/11/22 1755  Weight: 123.4 kg 127.7 kg   Body mass index is 37.14 kg/m. [name] GENERAL:alert, in no acute distress and comfortable SKIN: no acute rashes, no significant lesions EYES: conjunctiva are pink and non-injected, sclera anicteric OROPHARYNX: MMM LYMPH:  resolving rt chest wall LNadenopathy and rt neck LNadenopathy. LUNGS: clear to auscultation b/l with normal respiratory effort HEART: regular rate & rhythm ABDOMEN:  normoactive bowel sounds , non tender, not distended. Extremity: no pedal edema PSYCH: alert & oriented x 3 with fluent speech     MEDICAL HISTORY:  Past Medical History:  Diagnosis Date   Anal fissure    HIV infection ([place])    Internal hemorrhoids    Obesity    Rectal ulcer     SURGICAL HISTORY: Past Surgical History:  Procedure Laterality Date   IR IMAGING GUIDED PORT INSERTION  11/18/2022    SOCIAL HISTORY: Social History    Socioeconomic History   Marital status: Single    Spouse name: Not on file   Number of children: Not on file   Years of education: Not on file   Highest education level: Not on file  Occupational History   Not on file  Tobacco Use   Smoking status: Never   Smokeless tobacco: Never  Vaping Use   Vaping Use: Never used  Substance and Sexual Activity   Alcohol use: No   Drug use: No   Sexual activity: Yes    Comment: given condoms  Other Topics Concern   Not on file  Social History Narrative   Not on file   Social Determinants of Health   Financial Resource Strain: Low Risk  (10/24/2022)   Overall Financial Resource Strain (CARDIA)    Difficulty of Paying Living Expenses: Not hard at all  Food Insecurity: No Food Insecurity (11/11/2022)   Hunger Vital Sign    Worried About Running Out of Food in the Last Year: Never true    Ran Out of Food in the Last Year: Never true  Transportation Needs: No Transportation Needs (11/11/2022)   PRAPARE - Hydrologist (Medical): No    Lack of Transportation (Non-Medical): No  Physical Activity: Sufficiently Active (10/24/2022)   Exercise Vital Sign    Days of Exercise per Week: 3 days    Minutes of Exercise per Session: 60 min  Recent Concern:  Physical Activity - Insufficiently Active (10/24/2022)   Exercise Vital Sign    Days of Exercise per Week: 3 days    Minutes of Exercise per Session: 20 min  Stress: No Stress Concern Present (10/24/2022)   [place]    Feeling of Stress : Not at all  Social Connections: Unknown (10/24/2022)   Social Connection and Isolation Panel [NHANES]    Frequency of Communication with Friends and Family: Twice a week    Frequency of Social Gatherings with Friends and Family: Twice a week    Attends Religious Services: 1 to 4 times per year    Active Member of [employer] or Organizations: No    Attends Theatre manager Meetings: 1 to 4 times per year    Marital Status: Patient refused  Intimate Partner Violence: Not At Risk (11/11/2022)   Humiliation, Afraid, Rape, and Kick questionnaire    Fear of Current or Ex-Partner: No    Emotionally Abused: No    Physically Abused: No    Sexually Abused: No    FAMILY HISTORY: Family History  Problem Relation Age of Onset   Hypertension Father    Throat cancer Father    Heart attack Brother    Colon cancer Neg Hx    Esophageal cancer Neg Hx    Stomach cancer Neg Hx     ALLERGIES:  has No Known Allergies.  MEDICATIONS:  Scheduled Meds:  acetaminophen  1,000 mg Oral Once   allopurinol  100 mg Oral BID   bictegravir-emtricitabine-tenofovir AF  1 tablet Oral Daily   Chlorhexidine Gluconate Cloth  6 each Topical Daily   cyclophosphamide  750 mg/m2 (Treatment Plan Recorded) Intravenous Once   dexamethasone  6 mg Oral BID WC   DOXOrubicin (ADRIAMYCIN) 26 mg, etoposide (VEPESID) 128 mg, vinCRIStine (ONCOVIN) 1 mg in sodium chloride 0.9 % 1,000 mL chemo infusion   Intravenous Once   feeding supplement  237 mL Oral BID BM   lidocaine  5 mL Other Once   polyethylene glycol  17 g Oral Daily   prochlorperazine  10 mg Intravenous Once   senna-docusate  1 tablet Oral BID   sodium bicarbonate/sodium chloride   Mouth Rinse QID   sodium chloride flush  10-40 mL Intracatheter Q12H   sulfamethoxazole-trimethoprim  1 tablet Oral Daily   Continuous Infusions:  sodium chloride     ondansetron (ZOFRAN) 16 mg, dexamethasone (DECADRON) 20 mg in sodium chloride 0.9 % 50 mL IVPB     sodium chloride      PRN Meds:.acetaminophen **OR** acetaminophen, bisacodyl, guaiFENesin, hydrALAZINE, iohexol, ipratropium-albuterol, LORazepam, magnesium hydroxide, meclizine, metoprolol tartrate, ondansetron **OR** ondansetron (ZOFRAN) IV, oxyCODONE, prochlorperazine, sodium chloride flush, sodium phosphate, traMADol, traZODone  REVIEW OF SYSTEMS:   10 Point review of Systems  was done is negative except as noted above.  LABORATORY DATA:  I have reviewed the data as listed     Latest Ref Rng & Units 11/22/2022    6:32 AM 11/21/2022    4:55 AM 11/20/2022    5:52 AM  CBC  WBC 4.0 - 10.5 K/uL 3.7  4.5  3.7   Hemoglobin 13.0 - 17.0 g/dL 13.4  13.3  13.3   Hematocrit 39.0 - 52.0 % 41.0  41.1  41.7   Platelets 150 - 400 K/uL 245  237  230       Latest Ref Rng & Units 11/22/2022    6:32 AM 11/21/2022    4:55 AM  11/20/2022    5:52 AM  CMP  Glucose 70 - 99 mg/dL 115  127  122   BUN 6 - 20 mg/dL _0 Creatinine 0.61 - 1.24 mg/dL 0.90  0.91  0.95   Sodium 135 - 145 mmol/L 129  133  129   Potassium 3.5 - 5.1 mmol/L 3.9  4.5  4.7   Chloride 98 - 111 mmol/L 97  100  98   CO2 22 - 32 mmol/L _1 Calcium 8.9 - 10.3 mg/dL 8.6  8.8  8.6   Total Protein 6.5 - 8.1 g/dL 6.5  7.5  7.1   Total Bilirubin 0.3 - 1.2 mg/dL 1.8  1.7  1.1   Alkaline Phos 38 - 126 U/L 45  44  44   AST 15 - 41 U/L _2 ALT 0 - 44 U/L _3 Lab Results  Component Value Date   LABURIC 5.6 11/22/2022   Magnesium 2.3---11/22/22  Lab Results  Component Value Date   CALCIUM 8.6 (L) 11/22/2022   PHOS 4.6 11/21/2022      Surgical Pathology CASE: [id number] PATIENT: Loron Rowley Bone Marrow Report  DIAGNOSIS:  BONE MARROW, ASPIRATE, CLOT, CORE: -Normocellular bone marrow for age with trilineage hematopoiesis -See comment  PERIPHERAL BLOOD: -Mild normocytic-normochromic anemia -Mild neutrophilic left shift  COMMENT:  There is no definitive or diagnostic evidence of a B-cell lymphoproliferative process in this material.  Correlation with cytogenetic studies is recommended.    RADIOGRAPHIC STUDIES: I have personally reviewed the radiological images as listed and agreed with the findings in the report. DG Fluoro Guide Spinal/SI Jt Inj Left  Result Date: 11/20/2022 CLINICAL DATA:  Burkitt lymphoma, chemotherapy injection. EXAM: DIAGNOSTIC LUMBAR  PUNCTURE UNDER FLUOROSCOPIC GUIDANCE COMPARISON:  None Available. FLUOROSCOPY: Radiation Exposure Index (as provided by the fluoroscopic device): 14.9 mGy Kerma PROCEDURE: Procedure was discussed with the patient including risks and benefits. Patient's questions were answered. Written informed consent for the procedure was obtained. Timeout protocol followed. L3-4 disc space was localized under C-arm fluoroscopy. Skin prepped and draped in usual sterile fashion. Skin and soft tissues anesthetized with  10 mL of 1% lidocaine. Under sterile technique, 20 gauge spinal needle advanced into spinal canal. Clear colorless CSF was encountered. 7 mL of CSF was obtained for requested analysis. Patient was then injected with 12 mg of methotrexate intrathecally. Procedure tolerated well by patient without immediate complication. IMPRESSION: Successful lumbar puncture with intrathecal methotrexate administration. Electronically Signed   By: Lorin Picket M.D.   On: 11/20/2022 14:08   DG FL GUIDED LUMBAR PUNCTURE  Result Date: 11/20/2022 Lorin Picket, MD     11/20/2022  2:08 PM CLINICAL DATA: [Burkitt lymphoma, chemotherapy injection.] EXAM: DIAGNOSTIC LUMBAR PUNCTURE UNDER FLUOROSCOPIC GUIDANCE COMPARISON: [None Available.] FLUOROSCOPY: Radiation Exposure Index (as provided by the fluoroscopic device): [14.9] [mGy Kerma] PROCEDURE:   Procedure was discussed with the patient including risks and benefits.  Patient's questions were answered. Written informed consent for the procedure was obtained. Timeout protocol followed.  [L3-4] disc space was localized under C-arm fluoroscopy.  Skin prepped and draped in usual sterile fashion. Skin and soft tissues anesthetized with _4  10 mL of 1% lidocaine. Under sterile technique, [20 gauge] spinal needle advanced into spinal canal. Clear colorless CSF was encountered. 7 mL of CSF was obtained for requested analysis. Patient was then injected with 12 mg of methotrexate intrathecally.  Procedure tolerated well by patient without immediate complication.   IMPRESSION: [Successful lumbar puncture with intrathecal methotrexate administration.] Lorin Picket, mD  MR BRAIN W WO CONTRAST  Result Date: 11/19/2022 CLINICAL DATA:  Lymphoma staging. EXAM: MRI HEAD WITHOUT AND WITH CONTRAST TECHNIQUE: Multiplanar, multiecho pulse sequences of the brain and surrounding structures were obtained without and with intravenous contrast. CONTRAST:  46m GADAVIST GADOBUTROL 1 MMOL/ML IV SOLN COMPARISON:  None Available. FINDINGS: Brain: No acute infarction, hemorrhage, hydrocephalus, extra-axial collection or mass lesion. Vascular: Normal flow voids. Skull and upper cervical spine: Normal marrow signal. Sinuses/Orbits: Negative. Other: None. IMPRESSION: No evidence of intracranial metastatic disease. Electronically Signed   By: HMarin RobertsM.D.   On: 11/19/2022 16:17   NM PET Image Initial (PI) Skull Base To Thigh (F-18 FDG)  Result Date: 11/19/2022 CLINICAL DATA:  Initial treatment strategy for Burkitt's lymphoma. Initiation of chemotherapy same day. EXAM: NUCLEAR MEDICINE PET SKULL BASE TO THIGH TECHNIQUE: 14.3 mCi F-18 FDG was injected intravenously. Full-ring PET imaging was performed from the skull base to thigh after the radiotracer. CT data was obtained and used for attenuation correction and anatomic localization. Fasting blood glucose: 14.3 mg/dl COMPARISON:  None Available. FINDINGS: Mediastinal blood pool activity: SUV max 2.7 Liver activity: SUV max 3.8 NECK: A mat of intensely hypermetabolic RIGHT enlarged cervical lymph nodes involve the RIGHT level 2, level 3 and RIGHT supraclavicular nodes. Nodal mass beneath the sternocleidomastoid muscle measures 27 mm on image 40 with SUV max equal 20.23 RIGHT neck mass superficial to the sternocleidomastoid muscle extends to the skin surface above the RIGHT clavicle measuring 5.1 cm with SUV max equal 33 point 1 (image 44). Incidental CT findings: None.  CHEST: Hypermetabolic RIGHT jugular nodal conglomerateextends into the upper thoracic inlet and RIGHT upper mediastinum with SUV max equal 17 on image 50. No lower paratracheal adenopathy.  No hilar adenopathy. There are hypermetabolic RIGHT axillary nodes which are relatively small but intensely hypermetabolic. For example 8 mm node on image 58/4 with SUV max equal 8.6. Incidental CT findings: No suspicious pulmonary nodules. Port in the anterior chest wall with tip in distal SVC. ABDOMEN/PELVIS: No abnormal hypermetabolic activity within the liver, pancreas, adrenal glands, or spleen. No hypermetabolic lymph nodes in the abdomen or pelvis. Spleen is normal volume and normal metabolic activity. Activity along the anterior LEFT chest wall related to port ejection is incidental/artifactual. Incidental CT findings: None. SKELETON: No abnormal marrow activity suggest lymphoma involvement. Incidental CT findings: None. IMPRESSION: 1. Enlarged intense hypermetabolic RIGHT cervical nodal mass extending from the level 2 nodal station to the RIGHT supraclavicular nodal station. Findings consistent high-grade lymphoma 2. Superficial RIGHT neck mass extends to the skin surface superficial to the RIGHT sternocleidomastoid muscle. Intense metabolic activity consistent with high-grade lymphoma. 3. Hypermetabolic small RIGHT axillary nodal disease. 4. No evidence of spleen involvement or solid organ involvement below the diaphragms. 5. No evidence of marrow involvement. Electronically Signed   By: SSuzy BouchardM.D.   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 EXAM: IMPLANTED PORT A CATH PLACEMENT WITH ULTRASOUND AND FLUOROSCOPIC GUIDANCE COMPARISON:  None Available. MEDICATIONS: None. ANESTHESIA/SEDATION: Moderate (conscious) sedation was employed during this  procedure. A total of Versed 2 mg and Fentanyl 100 mcg was administered intravenously. Moderate Sedation Time: 22 minutes. The patient's level of consciousness and vital signs were monitored continuously by radiology nursing throughout  the procedure under my direct supervision. CONTRAST:  None FLUOROSCOPY TIME:  Three mGy COMPLICATIONS: None immediate. PROCEDURE: The procedure, risks, benefits, and alternatives were explained to the patient. Questions regarding the procedure were encouraged and answered. The patient understands and consents to the procedure. The left neck and chest were prepped with chlorhexidine in a sterile fashion, and a sterile drape was applied covering the operative field. Maximum barrier sterile technique with sterile gowns and gloves were used for the procedure. A timeout was performed prior to the initiation of the procedure. Ultrasound was used to examine the jugular vein which was compressible and free of internal echoes. A skin marker was used to demarcate the planned venotomy and port pocket incision sites. Local anesthesia was provided to these sites and the subcutaneous tunnel track with 1% lidocaine with 1:100,000 epinephrine. A small incision was created at the jugular access site and blunt dissection was performed of the subcutaneous tissues. Under ultrasound guidance, the jugular vein was accessed with a 21 ga micropuncture needle and an 0.018" wire was inserted to the superior vena cava. Real-time ultrasound guidance was utilized for vascular access including the acquisition of a permanent ultrasound image documenting patency of the accessed vessel. A 5 Fr micopuncture set was then used, through which a 0.035" Rosen wire was passed under fluoroscopic guidance into the inferior vena cava. An 8 Fr dilator was then placed over the wire. A subcutaneous port pocket was then created along the upper chest wall utilizing a combination of sharp and blunt dissection. The pocket was  irrigated with sterile saline, packed with gauze, and observed for hemorrhage. A single lumen "ISP" sized power injectable port was chosen for placement. The 8 Fr catheter was tunneled from the port pocket site to the venotomy incision. The port was placed in the pocket. The external catheter was trimmed to appropriate length. The dilator was exchanged for an 8 Fr peel-away sheath under fluoroscopic guidance. The catheter was then placed through the sheath and the sheath was removed. Final catheter positioning was confirmed and documented with a fluoroscopic spot radiograph. The port was accessed with a Huber needle, aspirated, and flushed with heparinized saline. The deep dermal layer of the port pocket incision was closed with interrupted 3-0 Vicryl suture. The skin was opposed with a running subcuticular 4-0 Monocryl suture. Dermabond was then placed over the port pocket and neck incisions. The patient tolerated the procedure well without immediate post procedural complication. FINDINGS: After catheter placement, the tip lies within the superior cavoatrial junction. The catheter aspirates and flushes normally and is ready for immediate use. IMPRESSION: Successful placement of a power injectable Port-A-Cath via the left internal jugular vein. The catheter is ready for immediate use. Ruthann Cancer, MD [facility] Electronically Signed   By: Ruthann Cancer M.D.   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 EXAM: CT-GUIDED BONE MARROW BIOPSY AND ASPIRATION MEDICATIONS: None ANESTHESIA/SEDATION: Fentanyl 100 mcg IV; Versed 2 mg IV Sedation Time: 16 minutes; The patient was continuously monitored during the procedure by the interventional radiology nurse under my direct supervision. COMPLICATIONS: None immediate. PROCEDURE: Informed consent was obtained from  the patient following an explanation of the procedure, risks, benefits and alternatives. The patient understands, agrees and consents for the procedure. All questions were addressed. A time out was performed prior to the initiation of the procedure. The patient was positioned prone  and non-contrast localization CT was performed of the pelvis to demonstrate the iliac marrow spaces. The operative site was prepped and draped in the usual sterile fashion. Under sterile conditions and local anesthesia, a 22 gauge spinal needle was utilized for procedural planning. Next, an 11 gauge coaxial bone biopsy needle was advanced into the right iliac marrow space. Needle position was confirmed with CT imaging. Initially, a bone marrow aspiration was performed. Next, a bone marrow biopsy was obtained with the 11 gauge outer bone marrow device. Samples were prepared with the cytotechnologist and deemed adequate. The needle was removed and superficial hemostasis was obtained with manual compression. A dressing was applied. The patient tolerated the procedure well without immediate post procedural complication. IMPRESSION: Successful CT guided right iliac bone marrow aspiration and core biopsy. Ruthann Cancer, MD [facility] Electronically Signed   By: Ruthann Cancer M.D.   On: 11/18/2022 11:34   [place] EKG SITE RITE  Result Date: 11/15/2022 If Site Rite image not attached, placement could not be confirmed due to current cardiac rhythm.  ECHOCARDIOGRAM COMPLETE  Result Date: 11/14/2022    ECHOCARDIOGRAM REPORT   Patient Name:   REDMOND WHITTLEY Date of Exam: 11/14/2022 Medical Rec #:  [id number]      Height:       73.0 in Accession #:    [id number]     Weight:       281.5 lb Date of Birth:  January 26, 1981       BSA:          2.488 m Patient Age:    72 years       BP:           129/79 mmHg Patient Gender: M              HR:           75 bpm. Exam Location:  Inpatient Procedure: 2D  Echo, Color Doppler, Cardiac Doppler and Intracardiac            Opacification Agent Indications:    Chemo Z09  History:        Patient has no prior history of Echocardiogram examinations.                 Chest Mass; Signs/Symptoms:Shortness of Breath.  Sonographer:    Greer Pickerel Referring Phys: [id number] Brunetta Genera  Sonographer Comments: Image acquisition challenging due to patient body habitus and Image acquisition challenging due to respiratory motion. Global longitudinal strain was attempted. IMPRESSIONS  1. Left ventricular ejection fraction, by estimation, is 60 to 65%. The left ventricle has normal function. The left ventricle has no regional wall motion abnormalities. There is mild left ventricular hypertrophy. Left ventricular diastolic parameters were normal.  2. Right ventricular systolic function is normal. The right ventricular size is normal. Tricuspid regurgitation signal is inadequate for assessing PA pressure.  3. The mitral valve is normal in structure. No evidence of mitral valve regurgitation.  4. The aortic valve is tricuspid. Aortic valve regurgitation is not visualized.  5. The inferior vena cava is normal in size with greater than 50% respiratory variability, suggesting right atrial pressure of 3 mmHg. Comparison(s): No prior Echocardiogram. FINDINGS  Left Ventricle: Left ventricular ejection fraction, by estimation, is 60 to 65%. The left ventricle has normal function. The left ventricle has no regional wall motion abnormalities. Definity contrast agent was given IV to delineate the left ventricular  endocardial borders. The left ventricular internal  cavity size was normal in size. There is mild left ventricular hypertrophy. Left ventricular diastolic parameters were normal. Right Ventricle: The right ventricular size is normal. No increase in right ventricular wall thickness. Right ventricular systolic function is normal. Tricuspid regurgitation signal is inadequate for assessing  PA pressure. Left Atrium: Left atrial size was normal in size. Right Atrium: Right atrial size was normal in size. Pericardium: There is no evidence of pericardial effusion. Mitral Valve: The mitral valve is normal in structure. No evidence of mitral valve regurgitation. Tricuspid Valve: The tricuspid valve is normal in structure. Tricuspid valve regurgitation is not demonstrated. Aortic Valve: The aortic valve is tricuspid. Aortic valve regurgitation is not visualized. Pulmonic Valve: Pulmonic valve regurgitation is not visualized. Aorta: The aortic root and ascending aorta are structurally normal, with no evidence of dilitation. Venous: The inferior vena cava is normal in size with greater than 50% respiratory variability, suggesting right atrial pressure of 3 mmHg. IAS/Shunts: No atrial level shunt detected by color flow Doppler.  LEFT VENTRICLE PLAX 2D LVIDd:         3.60 cm   Diastology LVIDs:         2.40 cm   LV e' medial:    6.85 cm/s LV PW:         1.40 cm   LV E/e' medial:  13.3 LV IVS:        0.80 cm   LV e' lateral:   12.70 cm/s LVOT diam:     2.10 cm   LV E/e' lateral: 7.1 LV SV:         77 LV SV Index:   31 LVOT Area:     3.46 cm  RIGHT VENTRICLE RV S prime:     17.70 cm/s TAPSE (M-mode): 2.8 cm LEFT ATRIUM           Index        RIGHT ATRIUM           Index LA diam:      3.10 cm 1.25 cm/m   RA Area:     16.80 cm LA Vol (A2C): 46.4 ml 18.65 ml/m  RA Volume:   40.80 ml  16.40 ml/m LA Vol (A4C): 36.6 ml 14.71 ml/m  AORTIC VALVE LVOT Vmax:   134.00 cm/s LVOT Vmean:  81.400 cm/s LVOT VTI:    0.223 m  AORTA Ao Root diam: 3.80 cm Ao Asc diam:  3.20 cm MITRAL VALVE MV Area (PHT): 3.63 cm    SHUNTS MV Decel Time: 209 msec    Systemic VTI:  0.22 m MR Peak grad: 3.7 mmHg     Systemic Diam: 2.10 cm MR Vmax:      96.60 cm/s MV E velocity: 90.80 cm/s MV A velocity: 76.70 cm/s MV E/A ratio:  1.18 Landscape architect signed by Phineas Inches Signature Date/Time: 11/14/2022/4:58:35 PM    Final    [place] CORE  BIOPSY (SOFT TISSUE)  Result Date: 11/12/2022 INDICATION: 42 year old gentleman with history of lymphoma presents to IR for biopsy of right neck mass EXAM: Ultrasound-guided biopsy of right neck mass MEDICATIONS: None. ANESTHESIA/SEDATION: Moderate (conscious) sedation was employed during this procedure. A total of Versed 2.5 mg and Fentanyl 75 mcg was administered intravenously. Moderate Sedation Time: 10 minutes. The patient's level of consciousness and vital signs were monitored continuously by radiology nursing throughout the procedure under my direct supervision. COMPLICATIONS: None immediate. PROCEDURE: Informed written consent was obtained from the patient after a thorough discussion of the procedural  risks, benefits and alternatives. All questions were addressed. Maximal Sterile Barrier Technique was utilized including caps, mask, sterile gowns, sterile gloves, sterile drape, hand hygiene and skin antiseptic. A timeout was performed prior to the initiation of the procedure. Patient position supine on the ultrasound table. Right neck skin prepped and draped in usual sterile fashion. Following local lidocaine administration, 5- 18 gauge cores were obtained from the right neck mass utilizing continuous ultrasound guidance. Two cores were sent for Gram stain and culture in sterile saline. Three cores were sent for histologic analysis in formalin. Needle removed and hemostasis achieved with 5 minutes of manual compression. Post procedure ultrasound images showed no evidence of significant hemorrhage. IMPRESSION: Ultrasound-guided biopsy of right neck mass. Electronically Signed   By: Miachel Roux M.D.   On: 11/12/2022 17:17   CT Soft Tissue Neck W Contrast  Result Date: 11/10/2022 CLINICAL DATA:  Neck mass with history of HIV. EXAM: CT NECK WITH CONTRAST TECHNIQUE: Multidetector CT imaging of the neck was performed using the standard protocol following the bolus administration of intravenous contrast.  RADIATION DOSE REDUCTION: This exam was performed according to the departmental dose-optimization program which includes automated exposure control, adjustment of the mA and/or kV according to patient size and/or use of iterative reconstruction technique. CONTRAST:  144m OMNIPAQUE IOHEXOL 350 MG/ML SOLN COMPARISON:  CT neck 6 days ago. FINDINGS: Pharynx and larynx: The nasal cavity and nasopharynx are unremarkable. The oral cavity and oropharynx are unremarkable. The parapharyngeal spaces are clear. The hypopharynx and larynx are unremarkable. The vocal folds are normal in appearance. There is no retropharyngeal fluid collection.  The airway is patent. Salivary glands: The parotid and submandibular glands are unremarkable. Thyroid: Unremarkable. Lymph nodes: There is enlarged centrally necrotic right level I lymph node measuring up to 2.1 cm in short axis, similar to the CT from 6 days ago. The large infiltrative centrally necrotic soft tissue mass throughout the right neck measuring up to approximately 8.1 cm TV x 5.9 cm AP by 7.4 cm cc is also not significantly changed in size. Inferior extent into the upper chest anterior to the thyroid with centrally necrotic appearing tissue measuring 4.9 cm x 5.4 cm is unchanged when measured again using similar technique. Overlying skin thickening is similar. The mass is inseparable from the sternocleidomastoid muscle. There are numerous additional prominent right-sided cervical chain lymph nodes measuring up to 1.0 cm at level IIA (3-38), and 1.1 cm in short axis in the posterior triangle (3-43). Vascular: The major vasculature of the neck is unremarkable. Limited intracranial: The imaged portions of the posterior fossa are unremarkable. Visualized orbits: Not included within the field of view. Mastoids and visualized paranasal sinuses: Clear. Skeleton: There is no acute osseous abnormality or suspicious osseous lesion. Upper chest: Assessed on the separately dictated CT  chest. Other: None. IMPRESSION: No significant interval change since the CT neck from 6 days prior. Unchanged infiltrative soft tissue mass in the right neck extending to the upper chest anterior to the clavicle with central necrosis and surrounding lymphadenopathy. Differential again includes malignancy (sarcoma, lymphoma), or infection (including TB. Sampling is recommended. Electronically Signed   By: PValetta MoleM.D.   On: 11/10/2022 16:27   CT CHEST ABDOMEN PELVIS W CONTRAST  Result Date: 11/10/2022 CLINICAL DATA:  Lymphoma, neck mass, metastatic disease evaluation * Tracking Code: BO * EXAM: CT CHEST, ABDOMEN, AND PELVIS WITH CONTRAST TECHNIQUE: Multidetector CT imaging of the chest, abdomen and pelvis was performed following the standard protocol during bolus  administration of intravenous contrast. RADIATION DOSE REDUCTION: This exam was performed according to the departmental dose-optimization program which includes automated exposure control, adjustment of the mA and/or kV according to patient size and/or use of iterative reconstruction technique. CONTRAST:  134m OMNIPAQUE IOHEXOL 350 MG/ML SOLN COMPARISON:  CT neck, 11/04/2022, CT pelvis, 02/09/2017 FINDINGS: CT CHEST FINDINGS Cardiovascular: No significant vascular findings. Normal heart size. No pericardial effusion. Mediastinum/Nodes: Enlarged right lower cervical, supraclavicular, and bilateral axillary lymph nodes, largest right axillary nodes measuring up to 2.2 x 1.7 cm (series 3, image 18). Diffuse fat stranding throughout the superior mediastinum (series 3, image 19), thyroid gland, trachea, and esophagus demonstrate no significant findings. Lungs/Pleura: Lungs are clear. No pleural effusion or pneumothorax. Musculoskeletal: Partially imaged, heterogeneous mass centered in the anterior right neck, incompletely imaged and better assessed by simultaneous dedicated examination of the neck (series 3, image 1). No acute osseous findings. CT  ABDOMEN PELVIS FINDINGS Hepatobiliary: No solid liver abnormality is seen. No gallstones, gallbladder wall thickening, or biliary dilatation. Pancreas: Unremarkable. No pancreatic ductal dilatation or surrounding inflammatory changes. Spleen: Normal in size without significant abnormality. Adrenals/Urinary Tract: Adrenal glands are unremarkable. Kidneys are normal, without renal calculi, solid lesion, or hydronephrosis. Bladder is unremarkable. Stomach/Bowel: Stomach is within normal limits. Appendix appears normal. No evidence of bowel wall thickening, distention, or inflammatory changes. Vascular/Lymphatic: No significant vascular findings are present. Prominent subcentimeter retroperitoneal and iliac lymph nodes, for example a right external iliac node measuring 1.1 x 0.8 cm (series 3, image 102). Reproductive: No mass or other abnormality. Other: No abdominal wall hernia or abnormality. No ascites. Diffuse retroperitoneal fat stranding (series 3, image 80). Musculoskeletal: No acute osseous findings. IMPRESSION: 1. Partially imaged, heterogeneous mass centered in the anterior right neck, incompletely imaged and better assessed by simultaneous dedicated examination of the neck. As previously reported, general differential considerations include both malignancy and infectious process such as scrofula. 2. Enlarged right lower cervical, supraclavicular, and bilateral axillary lymph nodes, which may be reactive although are generally concerning for metastatic disease. 3. Prominent subcentimeter retroperitoneal and iliac lymph nodes, nonspecific although as above concerning for additional nodal metastatic disease. 4. Diffuse fat stranding throughout the superior mediastinum, of uncertain nature, possibly reflecting mediastinal infectious involvement or alternately vascular/lymphatic congestion. Prior thoracic radiotherapy could likewise have this appearance. 5. Diffuse retroperitoneal fat stranding, as above of  uncertain significance, possibly infectious or inflammatory, for example incidental and related to pancreatitis, or as above perhaps related to prior abdominal radiotherapy. 6. No mass or evidence of organ metastatic disease in the abdomen or pelvis. Electronically Signed   By: ADelanna AhmadiM.D.   On: 11/10/2022 16:26   DG Chest 2 View  Result Date: 11/10/2022 CLINICAL DATA:  Dyspnea EXAM: CHEST - 2 VIEW COMPARISON:  06/27/2016 FINDINGS: Small areas of consolidation or volume loss identified right middle lobe and lingula. No pneumothorax or pleural effusion. Normal pulmonary vasculature. Unremarkable cardiac silhouette. Unremarkable osseous structures. IMPRESSION: Medial bibasilar subsegmental atelectasis or consolidation. Electronically Signed   By: JSammie BenchM.D.   On: 11/10/2022 14:53   CT SOFT TISSUE NECK W CONTRAST  Result Date: 11/05/2022 CLINICAL DATA:  Neck mass. EXAM: CT NECK WITH CONTRAST TECHNIQUE: Multidetector CT imaging of the neck was performed using the standard protocol following the bolus administration of intravenous contrast. RADIATION DOSE REDUCTION: This exam was performed according to the departmental dose-optimization program which includes automated exposure control, adjustment of the mA and/or kV according to patient size and/or use of iterative reconstruction  technique. CONTRAST:  50m OMNIPAQUE IOHEXOL 350 MG/ML SOLN COMPARISON:  CT Neck 07/23/22 FINDINGS: Pharynx and larynx: Normal. No mass or swelling. Salivary glands: No inflammation, mass, or stone. Thyroid: Normal. Lymph nodes: There are prominent lymph nodes, for example a 1.2 cm level 1 a lymph node, unchanged from prior exam. There is also a prominent 8 mm right level 3 lymph, slightly increased in size from prior exam. Along the inferior aspect of the right submandibular gland there is a 2.8 x 1.8 cm lesion with central hypodensity (series 3, image 56) which could represent a centrally necrotic lymph node.  Vascular: There is mass effect on the right internal jugular vein (series 3, image 68). Limited intracranial: Negative. Visualized orbits: Negative. Mastoids and visualized paranasal sinuses: Clear. Skeleton: No acute or aggressive process. Upper chest: See below.  No focal pulmonary nodule is visualized. Other: There is marked interval worsening in the degree soft tissue abnormality seen in the right neck compared to prior exam dated 07/23/2022. There is now a large soft tissue mass along the anterior chest wall, adjacent to the sternoclavicular joint on the right measuring 4.2 x 3 4 x 5.6 cm (series 3, image 5). There is infiltrative soft tissue surrounding this mass that extends superiorly along the supraclavicular region to the level of the hyoid bone and submandibular region (series 3, image 64). Inferiorly this mass likely now also extends into the mediastinum where there is soft tissue stranding (series 3, image 115). Infiltrative soft tissues also seen along the left supraclavicular region (series 3, image 77), but to a lesser degree than the right. Portions of this mass appear to have a cutaneous component, for example along the right aspect of the chin (series 3, image 59), as well as the portion adjacent to the right sternoclavicular joint (series 3, image 79). IMPRESSION: 1. Marked interval worsening in the degree of soft tissue abnormality seen in the right neck compared to prior exam dated 07/23/2022. There is now a multi spatial infiltrative soft tissue mass involving a large portion of the right neck. Inferiorly this mass extends into the mediastinum and superiorly this mass extends to the submandibular region. Infiltrative soft tissue is also seen along the left supraclavicular region. Portions of this mass appear to have a cutaneous component, for example along the right aspect of the chin, as well as the right sternoclavicular joint. Findings are concerning for malignancy, with differential  considerations including lymphoma or sarcoma. Tissue sampling is recommended. Additionally CT of the chest is also recommended to fully characterize the mediastinal extent of this mass. 2. 2.8 x 1.8 cm lesion with central hypodensity along the inferior aspect of the right submandibular gland could represent a centrally necrotic lymph node. Electronically Signed   By: HMarin RobertsM.D.   On: 11/05/2022 10:29    ASSESSMENT & PLAN:    41year old male with HIV/AIDS with newly diagnosed Burkitt's lymphoma   #1 Newly diagnosed Burkitt's lymphoma Presenting with rapidly growing right neck mass extending into the chest, bilateral x-ray lymphadenopathy and also concern for possible involvement of retroperitoneal lymph nodes. Concerning for at least stage II possibly stage III disease. Some borderline lymphadenopathy in the abdomen could also be from his recently diagnosed HIV/AIDS. No clinical symptoms suggestive of CNS involvement at this time. No constitutional symptoms. No significant cytopenias to suggest bone marrow involvement. Discussed with patient and he is not interested in fertility preservation or sperm banking. [facility]  [facility] and antibiotics with improvement in swelling prior to additional progression.   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  Patient reports this was completely treated.   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  On allopurinol for TLS prophylaxis  PLAN: -Patient notes no acute toxicities from cycle 1 day 5 of daEPOCH-R chemotherapy for his newly diagnosed Burkitt's lymphoma. -Labs this morning are stable uric acid levels of magnesium and phosphorus levels.  No uncontrolled tumor lysis syndrome. -Continue allopurinol for TLS prophylaxis at least till the second cycle of treatment. -Spinal fluid cytology  showed no evidence of cancer. -Bone marrow biopsy results reviewed. Bone marrow shows no overt evidence of lymphoma involvement. -Rituxan and Neulasta as outpatient at [facility] on 11/25/2022 -Salt and baking soda mouthwashes 4 times a day to reduce mucositis. -On MiraLAX and senna S for constipation on discharge. -On Bactrim for PJP prophylaxis.  Okay to hold Bactrim for a few days as intrathecal methotrexate. -HIV AIDS management per infectious disease currently on Biktarvy. -He is a CNA at a nursing home and given his increased risk for infections he is planning to apply for long-term disability. Will connect with SW in cancer center. -supportive medications sent to pharmacy -okay to discharge after chemotherapy completion today from oncology standpoint. Has scheduled appointment at cancer center on 12/11 at 7:45 for udencya and Rituxan  The total time spent in the appointment was 35 minutes* .  All of the patient's questions were answered with apparent satisfaction. The patient knows to call the clinic with any problems, questions or concerns.  I,Alexis Herring,acting as a scribe for No name on file.,have documented all relevant documentation on the behalf of No name on file,as directed by  No name on file while in the presence of No name on file.   .I have reviewed the above documentation for accuracy and completeness, and I agree with the above.   Sullivan Lone MD MS AAHIVMS [facility] Hematology/Oncology Physician [facility]  .*Total Encounter Time as defined by the Centers for Medicare and Medicaid Services includes, in addition to the face-to-face time of a patient visit (documented in the note above) non-face-to-face time: obtaining and reviewing outside history, ordering and reviewing medications, tests or procedures, care coordination (communications with other health care professionals or caregivers) and documentation in the medical record.

## 2022-11-22 NOTE — TOC Initial Note (Signed)
Transition of Care [name]) - Initial/Assessment Note    Patient Details  Name: Logan Ward MRN: [id number] Date of Birth: [birth year]  [facility]) CM/SW Contact:    Henrietta Dine, RN Phone Number: 11/22/2022, 9:38 AM  Clinical Narrative:                   Transition of Care [facility]) Screening Note   Patient Details  Name: Logan Ward Date of Birth: 09-Oct-1981   Transition of Care [facility]) CM/SW Contact:    Henrietta Dine, RN Phone Number: 11/22/2022, 9:38 AM    Transition of Care Department [facility]) has reviewed patient and no TOC needs have been identified at this time. We will continue to monitor patient advancement through interdisciplinary progression rounds. If new patient transition needs arise, please place a TOC consult.     Barriers to Discharge: Continued Medical Work up   Patient Goals and CMS Choice        Expected Discharge Plan and Services                                                Prior Living Arrangements/Services                       Activities of Daily Living Home Assistive Devices/Equipment: None ADL Screening (condition at time of admission) Patient's cognitive ability adequate to safely complete daily activities?: Yes Is the patient deaf or have difficulty hearing?: No Does the patient have difficulty seeing, even when wearing glasses/contacts?: No Does the patient have difficulty concentrating, remembering, or making decisions?: No Patient able to express need for assistance with ADLs?: Yes Does the patient have difficulty dressing or bathing?: No Independently performs ADLs?: Yes (appropriate for developmental age) Does the patient have difficulty walking or climbing stairs?: No Weakness of Legs: None Weakness of Arms/Hands: None  Permission Sought/Granted                  Emotional Assessment              Admission diagnosis:  Chest mass [R22.2] SOB (shortness of  breath) [R06.02] Mass in neck [R22.1] Patient Active Problem List   Diagnosis Date Noted   Drug-induced constipation 11/21/2022   Nausea without vomiting 11/21/2022   Acute nonintractable headache 11/21/2022   Encounter for antineoplastic chemotherapy 11/18/2022   Burkitt lymphoma of lymph nodes of multiple regions ([place]) 11/14/2022   Counseling regarding advance care planning and goals of care 11/14/2022   Chest mass 11/10/2022   Exposure to chlamydia 10/24/2022   Submental lymphadenopathy 09/05/2022   Goals of care, counseling/discussion 07/26/2022   History of syphilis 07/26/2022   Neck swelling 07/26/2022   AIDS (acquired immune deficiency syndrome) ([place]) 07/25/2022   PCP:  Pcp, No Pharmacy:   [id number] [address]Phone: [phone]: [address]Phone: [phone]: [phone] [facility] [address] [address]Phone: [phone]: [phone] [facility] [address]Phone: [phone]: [phone]  Social Determinants of Health (SDOH) Interventions    Readmission Risk Interventions     No data to display           

## 2022-11-23 ENCOUNTER — Other Ambulatory Visit (HOSPITAL_COMMUNITY): Payer: Self-pay

## 2022-11-23 LAB — CSF CULTURE W GRAM STAIN: Culture: NO GROWTH

## 2022-11-25 ENCOUNTER — Inpatient Hospital Stay (HOSPITAL_BASED_OUTPATIENT_CLINIC_OR_DEPARTMENT_OTHER): Payer: 59 | Admitting: Hematology

## 2022-11-25 ENCOUNTER — Inpatient Hospital Stay: Payer: 59 | Attending: Hematology

## 2022-11-25 ENCOUNTER — Other Ambulatory Visit: Payer: Self-pay

## 2022-11-25 ENCOUNTER — Inpatient Hospital Stay: Payer: 59

## 2022-11-25 VITALS — BP 118/70 | HR 85 | Temp 98.0°F | Resp 17 | Wt 265.0 lb

## 2022-11-25 DIAGNOSIS — C8378 Burkitt lymphoma, lymph nodes of multiple sites: Secondary | ICD-10-CM

## 2022-11-25 DIAGNOSIS — E883 Tumor lysis syndrome: Secondary | ICD-10-CM | POA: Diagnosis not present

## 2022-11-25 DIAGNOSIS — Z95828 Presence of other vascular implants and grafts: Secondary | ICD-10-CM

## 2022-11-25 DIAGNOSIS — B20 Human immunodeficiency virus [HIV] disease: Secondary | ICD-10-CM | POA: Insufficient documentation

## 2022-11-25 DIAGNOSIS — R42 Dizziness and giddiness: Secondary | ICD-10-CM | POA: Diagnosis not present

## 2022-11-25 DIAGNOSIS — Z7189 Other specified counseling: Secondary | ICD-10-CM

## 2022-11-25 DIAGNOSIS — Z5111 Encounter for antineoplastic chemotherapy: Secondary | ICD-10-CM | POA: Diagnosis not present

## 2022-11-25 DIAGNOSIS — R519 Headache, unspecified: Secondary | ICD-10-CM | POA: Insufficient documentation

## 2022-11-25 DIAGNOSIS — Z5189 Encounter for other specified aftercare: Secondary | ICD-10-CM | POA: Insufficient documentation

## 2022-11-25 DIAGNOSIS — C8370 Burkitt lymphoma, unspecified site: Secondary | ICD-10-CM | POA: Diagnosis present

## 2022-11-25 DIAGNOSIS — Z5112 Encounter for antineoplastic immunotherapy: Secondary | ICD-10-CM | POA: Insufficient documentation

## 2022-11-25 LAB — CBC WITH DIFFERENTIAL (CANCER CENTER ONLY)
Abs Immature Granulocytes: 0.02 10*3/uL (ref 0.00–0.07)
Basophils Absolute: 0 10*3/uL (ref 0.0–0.1)
Basophils Relative: 0 %
Eosinophils Absolute: 0 10*3/uL (ref 0.0–0.5)
Eosinophils Relative: 0 %
HCT: 40.9 % (ref 39.0–52.0)
Hemoglobin: 14 g/dL (ref 13.0–17.0)
Immature Granulocytes: 1 %
Lymphocytes Relative: 27 %
Lymphs Abs: 0.9 10*3/uL (ref 0.7–4.0)
MCH: 27.1 pg (ref 26.0–34.0)
MCHC: 34.2 g/dL (ref 30.0–36.0)
MCV: 79.1 fL — ABNORMAL LOW (ref 80.0–100.0)
Monocytes Absolute: 0 10*3/uL — ABNORMAL LOW (ref 0.1–1.0)
Monocytes Relative: 0 %
Neutro Abs: 2.4 10*3/uL (ref 1.7–7.7)
Neutrophils Relative %: 72 %
Platelet Count: 160 10*3/uL (ref 150–400)
RBC: 5.17 MIL/uL (ref 4.22–5.81)
RDW: 13.5 % (ref 11.5–15.5)
WBC Count: 3.4 10*3/uL — ABNORMAL LOW (ref 4.0–10.5)
nRBC: 0 % (ref 0.0–0.2)

## 2022-11-25 LAB — URIC ACID: Uric Acid, Serum: 6.2 mg/dL (ref 3.7–8.6)

## 2022-11-25 LAB — CMP (CANCER CENTER ONLY)
ALT: 23 U/L (ref 0–44)
AST: 10 U/L — ABNORMAL LOW (ref 15–41)
Albumin: 3.7 g/dL (ref 3.5–5.0)
Alkaline Phosphatase: 41 U/L (ref 38–126)
Anion gap: 8 (ref 5–15)
BUN: 16 mg/dL (ref 6–20)
CO2: 27 mmol/L (ref 22–32)
Calcium: 9.3 mg/dL (ref 8.9–10.3)
Chloride: 99 mmol/L (ref 98–111)
Creatinine: 0.77 mg/dL (ref 0.61–1.24)
GFR, Estimated: >60 mL/min (ref >60)
Glucose, Bld: 109 mg/dL — ABNORMAL HIGH (ref 70–99)
Potassium: 4.2 mmol/L (ref 3.5–5.1)
Sodium: 134 mmol/L — ABNORMAL LOW (ref 135–145)
Total Bilirubin: 1.6 mg/dL — ABNORMAL HIGH (ref 0.3–1.2)
Total Protein: 6.6 g/dL (ref 6.5–8.1)

## 2022-11-25 LAB — MAGNESIUM: Magnesium: 2 mg/dL (ref 1.7–2.4)

## 2022-11-25 MED ORDER — BUTALBITAL-APAP-CAFFEINE 50-325-40 MG PO TABS: 1.0000 | ORAL_TABLET | Freq: Four times a day (QID) | ORAL | 0 refills | Status: DC | PRN

## 2022-11-25 MED ORDER — SODIUM CHLORIDE 0.9 % IV SOLN
375.0000 mg/m2 | Freq: Once | INTRAVENOUS | Status: AC
  Administered 2022-11-25: 1000 mg via INTRAVENOUS
  Filled 2022-11-25: qty 100

## 2022-11-25 MED ORDER — DIPHENHYDRAMINE HCL 25 MG PO CAPS
50.0000 mg | ORAL_CAPSULE | Freq: Once | ORAL | Status: AC
  Administered 2022-11-25: 50 mg via ORAL
  Filled 2022-11-25: qty 2

## 2022-11-25 MED ORDER — METHYLPREDNISOLONE SODIUM SUCC 125 MG IJ SOLR
125.0000 mg | Freq: Once | INTRAMUSCULAR | Status: AC
  Administered 2022-11-25: 125 mg via INTRAVENOUS
  Filled 2022-11-25: qty 2

## 2022-11-25 MED ORDER — LORAZEPAM 0.5 MG PO TABS: 0.5000 mg | ORAL_TABLET | Freq: Three times a day (TID) | ORAL | 0 refills | Status: DC | PRN

## 2022-11-25 MED ORDER — FAMOTIDINE IN NACL 20-0.9 MG/50ML-% IV SOLN
20.0000 mg | Freq: Once | INTRAVENOUS | Status: AC
  Administered 2022-11-25: 20 mg via INTRAVENOUS
  Filled 2022-11-25: qty 50

## 2022-11-25 MED ORDER — SODIUM CHLORIDE 0.9% FLUSH
10.0000 mL | INTRAVENOUS | Status: AC | PRN
  Administered 2022-11-25: 10 mL

## 2022-11-25 MED ORDER — ACETAMINOPHEN 325 MG PO TABS
650.0000 mg | ORAL_TABLET | Freq: Once | ORAL | Status: AC
  Administered 2022-11-25: 650 mg via ORAL
  Filled 2022-11-25: qty 2

## 2022-11-25 MED ORDER — SODIUM CHLORIDE 0.9% FLUSH
10.0000 mL | INTRAVENOUS | Status: DC | PRN
  Administered 2022-11-25: 10 mL

## 2022-11-25 MED ORDER — PEGFILGRASTIM-CBQV 6 MG/0.6ML ~~LOC~~ SOSY
6.0000 mg | PREFILLED_SYRINGE | Freq: Once | SUBCUTANEOUS | Status: AC
  Administered 2022-11-25: 6 mg via SUBCUTANEOUS
  Filled 2022-11-25: qty 0.6

## 2022-11-25 MED ORDER — HEPARIN SOD (PORK) LOCK FLUSH 100 UNIT/ML IV SOLN
500.0000 [IU] | Freq: Once | INTRAVENOUS | Status: AC | PRN
  Administered 2022-11-25: 500 [IU]

## 2022-11-25 MED ORDER — SODIUM CHLORIDE 0.9 % IV SOLN: Freq: Once | INTRAVENOUS | Status: AC

## 2022-11-25 NOTE — Progress Notes (Signed)
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 male who has been referred to [place] by Dr Starla Link MD for evaluation and management of newly diagnosed Burkitt's lymphoma in the setting of known history of HIV/AIDS.   Patient has a history of HIV/AIDS [last CD4 count on 10/21/2022 of 501 and viral load of 324 copies per mL, acquired through by sexual contact, diagnosed 3 to 4 months ago, follows with Marya Amsler Calone], syphilis, obesity presented to the hospital on 11/10/2022 with rapidly growing right anterior chest wall mass.   CT of the neck on 11/10/2022 showed large infiltrative centrally necrotic soft tissue mass throughout the right neck and extending to the upper chest anterior to the clavicle measuring 8.1 x 5.9 x 7.4 cm in size.  There are numerous additional prominent right sided cervical chain lymph nodes measuring 1 cm at level 2A and 1.1 cm in the posterior triangle. CT chest abdomen pelvis done on 11/10/2022 showed enlarged right lower cervical, supraclavicular and bilaterally axillary lymph nodes.  Largest right axillary lymph node measuring 2.2 x 1.7 cm.  Diffuse fat stranding throughout the superior mediastinum.  Prominent subcentimeter retroperitoneal and iliac lymph nodes.  Diffuse retroperitoneal fat stranding of uncertain significance.  No evidence of mass or evidence of organ metastatic disease in the abdomen or pelvis.   Patient had an ultrasound-guided core needle biopsy of the right neck mass on 11/12/2022 which shows B-cell non-Hodgkin's lymphoma with high-grade features and findings consistent with Burkitt's lymphoma.  FISH testing for confirmatory purposes are currently pending.   Patient was treated with IV vancomycin and Unasyn empirically by  infectious disease for concerns of possible infection.   He is on Biktarvy for his HIV treatment and continues to be on Bactrim prophylaxis for PCP.   Hematology/oncology was consulted for further evaluation and management of his newly diagnosed Burkitt's lymphoma.   Patient notes that he first presented with right neck swelling in August and presented to [facility] and was empirically treated with antibiotics and steroids with improvement in his right neck swelling. About 2 weeks ago he started noticing right neck swelling again and this rapidly enlarged in size and started causing some effect on his breathing which is why it came to the emergency room.   He notes no weight loss.  No fevers no chills no night sweats.  No other acute new focal symptoms other than the right neck pain and swelling. No swallowing problems or acute shortness of breath at this time.   No new headaches or focal neurological deficits.  INTERVAL HISTORY: Logan Ward is a wonderful 41 y.o. male who is here for continued evaluation and management of newly diagnosed Burkitt's lymphoma in the setting of known history of HIV/AIDS. He is here to start cycle 1 day 1 of his Non-hodgkins Lymphoma Rituximab treatment.   Patient was hospitalized from 11/10/2022 to 11/22/2022 and was last seen by me on 11/22/2022 and was started on cycle 1 day 5 of dose adjusted EPOCH-R chemotherapy. He was tolerating his treatment well.   Patient complains of consistent headache since our last visit on 11/22/2022 and complains of dizziness upon changing position. He takes Tylenol for his headaches, and notes it helps for a while.  Patient also reports that he is not sleeping much at night due to sleeping throughout the day. He will try melatonin to help him sleep at night.   He denies fever, chills, back pain, abdominal pain, abnormal bowl moments, vision change, night sweats, leg swelling, or nausea. He occasionally has to  take nausea medication.   Has been staying hydrated and reports of drinking large [employer] coffee everyday.   MEDICAL HISTORY:  Past Medical History:  Diagnosis Date   Anal fissure    HIV infection ([place])    Internal hemorrhoids    Obesity    Rectal ulcer     SURGICAL HISTORY: Past Surgical History:  Procedure Laterality Date   IR IMAGING GUIDED PORT INSERTION  11/18/2022    SOCIAL HISTORY: Social History   Socioeconomic History   Marital status: Single    Spouse name: Not on file   Number of children: Not on file   Years of education: Not on file   Highest education level: Not on file  Occupational History   Not on file  Tobacco Use   Smoking status: Never   Smokeless tobacco: Never  Vaping Use   Vaping Use: Never used  Substance and Sexual Activity   Alcohol use: No   Drug use: No   Sexual activity: Yes    Comment: given condoms  Other Topics Concern   Not on file  Social History Narrative   Not on file   Social Determinants of Health   Financial Resource Strain: Low Risk  (10/24/2022)   Overall Financial Resource Strain (CARDIA)    Difficulty of Paying Living Expenses: Not hard at all  Food Insecurity: No Food Insecurity (11/11/2022)   Hunger Vital Sign    Worried About Running Out of Food in the Last Year: Never true    Ran Out of Food in the Last Year: Never true  Transportation Needs: No Transportation Needs (11/11/2022)   PRAPARE - Hydrologist (Medical): No    Lack of Transportation (Non-Medical): No  Physical Activity: Sufficiently Active (10/24/2022)   Exercise Vital Sign    Days of Exercise per Week: 3 days    Minutes of Exercise per Session: 60 min  Recent Concern: Physical Activity - Insufficiently Active (10/24/2022)   Exercise Vital Sign    Days of Exercise per Week: 3 days    Minutes of Exercise per Session: 20 min  Stress: No Stress Concern Present (10/24/2022)   [place]    Feeling of Stress : Not at all  Social Connections: Unknown (10/24/2022)   Social Connection and Isolation Panel [NHANES]    Frequency of Communication with Friends and Family: Twice a week    Frequency of Social Gatherings with Friends and Family: Twice a week    Attends Religious Services: 1 to 4 times per year    Active Member of [employer] or Organizations: No    Attends Archivist Meetings: 1 to 4 times per year    Marital Status: Patient refused  Intimate Partner Violence: Not At Risk (11/11/2022)   Humiliation, Afraid, Rape, and Kick questionnaire    Fear of Current or Ex-Partner: No    Emotionally Abused: No    Physically Abused: No    Sexually Abused: No    FAMILY HISTORY: Family History  Problem Relation Age of Onset   Hypertension Father    Throat cancer Father    Heart attack Brother  Colon cancer Neg Hx    Esophageal cancer Neg Hx    Stomach cancer Neg Hx     ALLERGIES:  has No Known Allergies.  MEDICATIONS:    REVIEW OF SYSTEMS:   10 Point review of Systems was done is negative except as noted above.  PHYSICAL EXAMINATION: NAD VSS GENERAL:alert, in no acute distress and comfortable SKIN: no acute rashes, no significant lesions EYES: conjunctiva are pink and non-injected, sclera anicteric OROPHARYNX: MMM LYMPH:  resolving rt chest wall LNadenopathy and rt neck LNadenopathy. LUNGS: clear to auscultation b/l with normal respiratory effort HEART: regular rate & rhythm ABDOMEN:  normoactive bowel sounds , non tender, not distended. Extremity: no pedal edema PSYCH: alert & oriented x 3 with fluent speech    LABORATORY DATA:  I have reviewed the data as listed     Latest Ref Rng & Units 11/25/2022    8:16 AM 11/22/2022    6:32 AM 11/21/2022    4:55 AM  CBC  WBC 4.0 - 10.5 K/uL 3.4  3.7  4.5   Hemoglobin 13.0 - 17.0 g/dL 14.0  13.4  13.3   Hematocrit 39.0 - 52.0 % 40.9  41.0  41.1   Platelets 150 - 400  K/uL 160  245  237       Latest Ref Rng & Units 11/25/2022    8:16 AM 11/22/2022    6:32 AM 11/21/2022    4:55 AM  CMP  Glucose 70 - 99 mg/dL 109  115  127   BUN 6 - 20 mg/dL _0 Creatinine 0.61 - 1.24 mg/dL 0.77  0.90  0.91   Sodium 135 - 145 mmol/L 134  129  133   Potassium 3.5 - 5.1 mmol/L 4.2  3.9  4.5   Chloride 98 - 111 mmol/L 99  97  100   CO2 22 - 32 mmol/L _1 Calcium 8.9 - 10.3 mg/dL 9.3  8.6  8.8   Total Protein 6.5 - 8.1 g/dL 6.6  6.5  7.5   Total Bilirubin 0.3 - 1.2 mg/dL 1.6  1.8  1.7   Alkaline Phos 38 - 126 U/L 41  45  44   AST 15 - 41 U/L _2 ALT 0 - 44 U/L _3 Lab Results  Component Value Date   LABURIC 6.2 11/25/2022   Magnesium 2.3---11/22/22  Lab Results  Component Value Date   CALCIUM 9.3 11/25/2022   PHOS 4.6 11/21/2022      Surgical Pathology CASE: [id number] PATIENT: Logan Ward Bone Marrow Report  DIAGNOSIS:  BONE MARROW, ASPIRATE, CLOT, CORE: -Normocellular bone marrow for age with trilineage hematopoiesis -See comment  PERIPHERAL BLOOD: -Mild normocytic-normochromic anemia -Mild neutrophilic left shift  COMMENT:  There is no definitive or diagnostic evidence of a B-cell lymphoproliferative process in this material.  Correlation with cytogenetic studies is recommended.    RADIOGRAPHIC STUDIES: I have personally reviewed the radiological images as listed and agreed with the findings in the report. DG Fluoro Guide Spinal/SI Jt Inj Left  Result Date: 11/20/2022 CLINICAL DATA:  Burkitt lymphoma, chemotherapy injection. EXAM: DIAGNOSTIC LUMBAR PUNCTURE UNDER FLUOROSCOPIC GUIDANCE COMPARISON:  None Available. FLUOROSCOPY: Radiation Exposure Index (as provided by the fluoroscopic device): 14.9 mGy Kerma PROCEDURE: Procedure was discussed with the patient including risks and benefits. Patient's questions were answered. Written informed consent for the procedure was obtained. Timeout protocol  followed. L3-4 disc space was localized under C-arm fluoroscopy. Skin prepped and draped in usual sterile fashion. Skin and soft tissues anesthetized with  10 mL of 1% lidocaine. Under sterile technique, 20 gauge spinal needle advanced into spinal canal. Clear colorless CSF was encountered. 7 mL of CSF was obtained for requested analysis. Patient was then injected with 12 mg of methotrexate intrathecally. Procedure tolerated well by patient without immediate complication. IMPRESSION: Successful lumbar puncture with intrathecal methotrexate administration. Electronically Signed   By: Lorin Picket M.D.   On: 11/20/2022 14:08   DG FL GUIDED LUMBAR PUNCTURE  Result Date: 11/20/2022 Lorin Picket, MD     11/20/2022  2:08 PM CLINICAL DATA: [Burkitt lymphoma, chemotherapy injection.] EXAM: DIAGNOSTIC LUMBAR PUNCTURE UNDER FLUOROSCOPIC GUIDANCE COMPARISON: [None Available.] FLUOROSCOPY: Radiation Exposure Index (as provided by the fluoroscopic device): [14.9] [mGy Kerma] PROCEDURE:   Procedure was discussed with the patient including risks and benefits.  Patient's questions were answered. Written informed consent for the procedure was obtained. Timeout protocol followed.  [L3-4] disc space was localized under C-arm fluoroscopy.  Skin prepped and draped in usual sterile fashion. Skin and soft tissues anesthetized with _0  10 mL of 1% lidocaine. Under sterile technique, [20 gauge] spinal needle advanced into spinal canal. Clear colorless CSF was encountered. 7 mL of CSF was obtained for requested analysis. Patient was then injected with 12 mg of methotrexate intrathecally. Procedure tolerated well by patient without immediate complication.   IMPRESSION: [Successful lumbar puncture with intrathecal methotrexate administration.] Lorin Picket, mD  MR BRAIN W WO CONTRAST  Result Date: 11/19/2022 CLINICAL DATA:  Lymphoma staging. EXAM: MRI HEAD WITHOUT AND WITH CONTRAST TECHNIQUE: Multiplanar, multiecho pulse  sequences of the brain and surrounding structures were obtained without and with intravenous contrast. CONTRAST:  10m GADAVIST GADOBUTROL 1 MMOL/ML IV SOLN COMPARISON:  None Available. FINDINGS: Brain: No acute infarction, hemorrhage, hydrocephalus, extra-axial collection or mass lesion. Vascular: Normal flow voids. Skull and upper cervical spine: Normal marrow signal. Sinuses/Orbits: Negative. Other: None. IMPRESSION: No evidence of intracranial metastatic disease. Electronically Signed   By: HMarin RobertsM.D.   On: 11/19/2022 16:17   NM PET Image Initial (PI) Skull Base To Thigh (F-18 FDG)  Result Date: 11/19/2022 CLINICAL DATA:  Initial treatment strategy for Burkitt's lymphoma. Initiation of chemotherapy same day. EXAM: NUCLEAR MEDICINE PET SKULL BASE TO THIGH TECHNIQUE: 14.3 mCi F-18 FDG was injected intravenously. Full-ring PET imaging was performed from the skull base to thigh after the radiotracer. CT data was obtained and used for attenuation correction and anatomic localization. Fasting blood glucose: 14.3 mg/dl COMPARISON:  None Available. FINDINGS: Mediastinal blood pool activity: SUV max 2.7 Liver activity: SUV max 3.8 NECK: A mat of intensely hypermetabolic RIGHT enlarged cervical lymph nodes involve the RIGHT level 2, level 3 and RIGHT supraclavicular nodes. Nodal mass beneath the sternocleidomastoid muscle measures 27 mm on image 40 with SUV max equal 20.23 RIGHT neck mass superficial to the sternocleidomastoid muscle extends to the skin surface above the RIGHT clavicle measuring 5.1 cm with SUV max equal 33 point 1 (image 44). Incidental CT findings: None. CHEST: Hypermetabolic RIGHT jugular nodal conglomerateextends into the upper thoracic inlet and RIGHT upper mediastinum with SUV max equal 17 on image 50. No lower paratracheal adenopathy.  No hilar adenopathy. There are hypermetabolic RIGHT axillary nodes which are relatively small but intensely hypermetabolic. For example 8 mm node on  image 58/4 with SUV max equal 8.6. Incidental CT findings: No suspicious pulmonary nodules. Port in the anterior chest  wall with tip in distal SVC. ABDOMEN/PELVIS: No abnormal hypermetabolic activity within the liver, pancreas, adrenal glands, or spleen. No hypermetabolic lymph nodes in the abdomen or pelvis. Spleen is normal volume and normal metabolic activity. Activity along the anterior LEFT chest wall related to port ejection is incidental/artifactual. Incidental CT findings: None. SKELETON: No abnormal marrow activity suggest lymphoma involvement. Incidental CT findings: None. IMPRESSION: 1. Enlarged intense hypermetabolic RIGHT cervical nodal mass extending from the level 2 nodal station to the RIGHT supraclavicular nodal station. Findings consistent high-grade lymphoma 2. Superficial RIGHT neck mass extends to the skin surface superficial to the RIGHT sternocleidomastoid muscle. Intense metabolic activity consistent with high-grade lymphoma. 3. Hypermetabolic small RIGHT axillary nodal disease. 4. No evidence of spleen involvement or solid organ involvement below the diaphragms. 5. No evidence of marrow involvement. Electronically Signed   By: Suzy Bouchard M.D.   On: 11/19/2022 16:02   IR IMAGING GUIDED PORT INSERTION  Result Date: 11/18/2022 INDICATION: 41 year old male with history of Burkitt lymphoma requiring central venous access for chemotherapy administration. EXAM: IMPLANTED PORT A CATH PLACEMENT WITH ULTRASOUND AND FLUOROSCOPIC GUIDANCE COMPARISON:  None Available. MEDICATIONS: None. ANESTHESIA/SEDATION: Moderate (conscious) sedation was employed during this procedure. A total of Versed 2 mg and Fentanyl 100 mcg was administered intravenously. Moderate Sedation Time: 22 minutes. The patient's level of consciousness and vital signs were monitored continuously by radiology nursing throughout the procedure under my direct supervision. CONTRAST:  None FLUOROSCOPY TIME:  Three mGy COMPLICATIONS:  None immediate. PROCEDURE: The procedure, risks, benefits, and alternatives were explained to the patient. Questions regarding the procedure were encouraged and answered. The patient understands and consents to the procedure. The left neck and chest were prepped with chlorhexidine in a sterile fashion, and a sterile drape was applied covering the operative field. Maximum barrier sterile technique with sterile gowns and gloves were used for the procedure. A timeout was performed prior to the initiation of the procedure. Ultrasound was used to examine the jugular vein which was compressible and free of internal echoes. A skin marker was used to demarcate the planned venotomy and port pocket incision sites. Local anesthesia was provided to these sites and the subcutaneous tunnel track with 1% lidocaine with 1:100,000 epinephrine. A small incision was created at the jugular access site and blunt dissection was performed of the subcutaneous tissues. Under ultrasound guidance, the jugular vein was accessed with a 21 ga micropuncture needle and an 0.018" wire was inserted to the superior vena cava. Real-time ultrasound guidance was utilized for vascular access including the acquisition of a permanent ultrasound image documenting patency of the accessed vessel. A 5 Fr micopuncture set was then used, through which a 0.035" Rosen wire was passed under fluoroscopic guidance into the inferior vena cava. An 8 Fr dilator was then placed over the wire. A subcutaneous port pocket was then created along the upper chest wall utilizing a combination of sharp and blunt dissection. The pocket was irrigated with sterile saline, packed with gauze, and observed for hemorrhage. A single lumen "ISP" sized power injectable port was chosen for placement. The 8 Fr catheter was tunneled from the port pocket site to the venotomy incision. The port was placed in the pocket. The external catheter was trimmed to appropriate length. The dilator was  exchanged for an 8 Fr peel-away sheath under fluoroscopic guidance. The catheter was then placed through the sheath and the sheath was removed. Final catheter positioning was confirmed and documented with a fluoroscopic spot radiograph. The port was  accessed with a Huber needle, aspirated, and flushed with heparinized saline. The deep dermal layer of the port pocket incision was closed with interrupted 3-0 Vicryl suture. The skin was opposed with a running subcuticular 4-0 Monocryl suture. Dermabond was then placed over the port pocket and neck incisions. The patient tolerated the procedure well without immediate post procedural complication. FINDINGS: After catheter placement, the tip lies within the superior cavoatrial junction. The catheter aspirates and flushes normally and is ready for immediate use. IMPRESSION: Successful placement of a power injectable Port-A-Cath via the left internal jugular vein. The catheter is ready for immediate use. Ruthann Cancer, MD [facility] Radiology Electronically Signed   By: Ruthann Cancer M.D.   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 EXAM: CT-GUIDED BONE MARROW BIOPSY AND ASPIRATION MEDICATIONS: None ANESTHESIA/SEDATION: Fentanyl 100 mcg IV; Versed 2 mg IV Sedation Time: 16 minutes; The patient was continuously monitored during the procedure by the interventional radiology nurse under my direct supervision. COMPLICATIONS: None immediate. PROCEDURE: Informed consent was obtained from the patient following an explanation of the procedure, risks, benefits and alternatives. The patient understands, agrees and consents for the procedure. All questions were addressed. A time out was performed prior to the initiation of the procedure. The patient was positioned prone and non-contrast localization CT was performed of the pelvis  to demonstrate the iliac marrow spaces. The operative site was prepped and draped in the usual sterile fashion. Under sterile conditions and local anesthesia, a 22 gauge spinal needle was utilized for procedural planning. Next, an 11 gauge coaxial bone biopsy needle was advanced into the right iliac marrow space. Needle position was confirmed with CT imaging. Initially, a bone marrow aspiration was performed. Next, a bone marrow biopsy was obtained with the 11 gauge outer bone marrow device. Samples were prepared with the cytotechnologist and deemed adequate. The needle was removed and superficial hemostasis was obtained with manual compression. A dressing was applied. The patient tolerated the procedure well without immediate post procedural complication. IMPRESSION: Successful CT guided right iliac bone marrow aspiration and core biopsy. Ruthann Cancer, MD [name] Radiology Electronically Signed   By: Ruthann Cancer M.D.   On: 11/18/2022 11:34   [place] EKG SITE RITE  Result Date: 11/15/2022 If Site Rite image not attached, placement could not be confirmed due to current cardiac rhythm.  ECHOCARDIOGRAM COMPLETE  Result Date: 11/14/2022    ECHOCARDIOGRAM REPORT   Patient Name:   Logan Ward Date of Exam: 11/14/2022 Medical Rec #:  [id number]      Height:       73.0 in Accession #:    [id number]     Weight:       281.5 lb Date of Birth:  Mar 09, 1981       BSA:          2.488 m Patient Age:    89 years       BP:           129/79 mmHg Patient Gender: M              HR:           75 bpm. Exam Location:  Inpatient Procedure: 2D Echo, Color Doppler, Cardiac Doppler and Intracardiac            Opacification Agent Indications:    Chemo Z09  History:        Patient has no prior history of Echocardiogram examinations.                 Chest Mass; Signs/Symptoms:Shortness of Breath.  Sonographer:    Greer Pickerel Referring Phys: [id number] Brunetta Genera  Sonographer  Comments: Image acquisition challenging due to patient body habitus and Image acquisition challenging due to respiratory motion. Global longitudinal strain was attempted. IMPRESSIONS  1. Left ventricular ejection fraction, by estimation, is 60 to 65%. The left ventricle has normal function. The left ventricle has no regional wall motion abnormalities. There is mild left ventricular hypertrophy. Left ventricular diastolic parameters were normal.  2. Right ventricular systolic function is normal. The right ventricular size is normal. Tricuspid regurgitation signal is inadequate for assessing PA pressure.  3. The mitral valve is normal in structure. No evidence of mitral valve regurgitation.  4. The aortic valve is tricuspid. Aortic valve regurgitation is not visualized.  5. The inferior vena cava is normal in size with greater than 50% respiratory variability, suggesting right atrial pressure of 3 mmHg. Comparison(s): No prior Echocardiogram. FINDINGS  Left Ventricle: Left ventricular ejection fraction, by estimation, is 60 to 65%. The left ventricle has normal function. The left ventricle has no regional wall motion abnormalities. Definity contrast agent was given IV to delineate the left ventricular  endocardial borders. The left ventricular internal cavity size was normal in size. There is mild left ventricular hypertrophy. Left ventricular diastolic parameters were normal. Right Ventricle: The right ventricular size is normal. No increase in right ventricular wall thickness. Right ventricular systolic function is normal. Tricuspid regurgitation signal is inadequate for assessing PA pressure. Left Atrium: Left atrial size was normal in size. Right Atrium: Right atrial size was normal in size. Pericardium: There is no evidence of pericardial effusion. Mitral Valve: The mitral valve is normal in structure. No evidence of mitral valve regurgitation. Tricuspid Valve: The tricuspid valve is normal in structure.  Tricuspid valve regurgitation is not demonstrated. Aortic Valve: The aortic valve is tricuspid. Aortic valve regurgitation is not visualized. Pulmonic Valve: Pulmonic valve regurgitation is not visualized. Aorta: The aortic root and ascending aorta are structurally normal, with no evidence of dilitation. Venous: The inferior vena cava is normal in size with greater than 50% respiratory variability, suggesting right atrial pressure of 3 mmHg. IAS/Shunts: No atrial level shunt detected by color flow Doppler.  LEFT VENTRICLE PLAX 2D LVIDd:         3.60 cm   Diastology LVIDs:         2.40 cm   LV e' medial:    6.85 cm/s LV PW:         1.40 cm   LV E/e' medial:  13.3 LV IVS:        0.80 cm   LV e' lateral:   12.70 cm/s LVOT diam:     2.10 cm   LV E/e' lateral: 7.1 LV SV:         77 LV SV Index:   31 LVOT Area:     3.46 cm  RIGHT VENTRICLE RV S prime:     17.70 cm/s TAPSE (M-mode): 2.8 cm LEFT ATRIUM           Index        RIGHT ATRIUM           Index LA diam:      3.10 cm 1.25 cm/m   RA Area:     16.80  cm LA Vol (A2C): 46.4 ml 18.65 ml/m  RA Volume:   40.80 ml  16.40 ml/m LA Vol (A4C): 36.6 ml 14.71 ml/m  AORTIC VALVE LVOT Vmax:   134.00 cm/s LVOT Vmean:  81.400 cm/s LVOT VTI:    0.223 m  AORTA Ao Root diam: 3.80 cm Ao Asc diam:  3.20 cm MITRAL VALVE MV Area (PHT): 3.63 cm    SHUNTS MV Decel Time: 209 msec    Systemic VTI:  0.22 m MR Peak grad: 3.7 mmHg     Systemic Diam: 2.10 cm MR Vmax:      96.60 cm/s MV E velocity: 90.80 cm/s MV A velocity: 76.70 cm/s MV E/A ratio:  1.18 Landscape architect signed by Phineas Inches Signature Date/Time: 11/14/2022/4:58:35 PM    Final    [place] CORE BIOPSY (SOFT TISSUE)  Result Date: 11/12/2022 INDICATION: 41 year old gentleman with history of lymphoma presents to IR for biopsy of right neck mass EXAM: Ultrasound-guided biopsy of right neck mass MEDICATIONS: None. ANESTHESIA/SEDATION: Moderate (conscious) sedation was employed during this procedure. A total of Versed 2.5 mg  and Fentanyl 75 mcg was administered intravenously. Moderate Sedation Time: 10 minutes. The patient's level of consciousness and vital signs were monitored continuously by radiology nursing throughout the procedure under my direct supervision. COMPLICATIONS: None immediate. PROCEDURE: Informed written consent was obtained from the patient after a thorough discussion of the procedural risks, benefits and alternatives. All questions were addressed. Maximal Sterile Barrier Technique was utilized including caps, mask, sterile gowns, sterile gloves, sterile drape, hand hygiene and skin antiseptic. A timeout was performed prior to the initiation of the procedure. Patient position supine on the ultrasound table. Right neck skin prepped and draped in usual sterile fashion. Following local lidocaine administration, 5- 18 gauge cores were obtained from the right neck mass utilizing continuous ultrasound guidance. Two cores were sent for Gram stain and culture in sterile saline. Three cores were sent for histologic analysis in formalin. Needle removed and hemostasis achieved with 5 minutes of manual compression. Post procedure ultrasound images showed no evidence of significant hemorrhage. IMPRESSION: Ultrasound-guided biopsy of right neck mass. Electronically Signed   By: Miachel Roux M.D.   On: 11/12/2022 17:17   CT Soft Tissue Neck W Contrast  Result Date: 11/10/2022 CLINICAL DATA:  Neck mass with history of HIV. EXAM: CT NECK WITH CONTRAST TECHNIQUE: Multidetector CT imaging of the neck was performed using the standard protocol following the bolus administration of intravenous contrast. RADIATION DOSE REDUCTION: This exam was performed according to the departmental dose-optimization program which includes automated exposure control, adjustment of the mA and/or kV according to patient size and/or use of iterative reconstruction technique. CONTRAST:  124m OMNIPAQUE IOHEXOL 350 MG/ML SOLN COMPARISON:  CT neck 6 days  ago. FINDINGS: Pharynx and larynx: The nasal cavity and nasopharynx are unremarkable. The oral cavity and oropharynx are unremarkable. The parapharyngeal spaces are clear. The hypopharynx and larynx are unremarkable. The vocal folds are normal in appearance. There is no retropharyngeal fluid collection.  The airway is patent. Salivary glands: The parotid and submandibular glands are unremarkable. Thyroid: Unremarkable. Lymph nodes: There is enlarged centrally necrotic right level I lymph node measuring up to 2.1 cm in short axis, similar to the CT from 6 days ago. The large infiltrative centrally necrotic soft tissue mass throughout the right neck measuring up to approximately 8.1 cm TV x 5.9 cm AP by 7.4 cm cc is also not significantly changed in size. Inferior extent into the upper chest  anterior to the thyroid with centrally necrotic appearing tissue measuring 4.9 cm x 5.4 cm is unchanged when measured again using similar technique. Overlying skin thickening is similar. The mass is inseparable from the sternocleidomastoid muscle. There are numerous additional prominent right-sided cervical chain lymph nodes measuring up to 1.0 cm at level IIA (3-38), and 1.1 cm in short axis in the posterior triangle (3-43). Vascular: The major vasculature of the neck is unremarkable. Limited intracranial: The imaged portions of the posterior fossa are unremarkable. Visualized orbits: Not included within the field of view. Mastoids and visualized paranasal sinuses: Clear. Skeleton: There is no acute osseous abnormality or suspicious osseous lesion. Upper chest: Assessed on the separately dictated CT chest. Other: None. IMPRESSION: No significant interval change since the CT neck from 6 days prior. Unchanged infiltrative soft tissue mass in the right neck extending to the upper chest anterior to the clavicle with central necrosis and surrounding lymphadenopathy. Differential again includes malignancy (sarcoma, lymphoma), or  infection (including TB. Sampling is recommended. Electronically Signed   By: Valetta Mole M.D.   On: 11/10/2022 16:27   CT CHEST ABDOMEN PELVIS W CONTRAST  Result Date: 11/10/2022 CLINICAL DATA:  Lymphoma, neck mass, metastatic disease evaluation * Tracking Code: BO * EXAM: CT CHEST, ABDOMEN, AND PELVIS WITH CONTRAST TECHNIQUE: Multidetector CT imaging of the chest, abdomen and pelvis was performed following the standard protocol during bolus administration of intravenous contrast. RADIATION DOSE REDUCTION: This exam was performed according to the departmental dose-optimization program which includes automated exposure control, adjustment of the mA and/or kV according to patient size and/or use of iterative reconstruction technique. CONTRAST:  156m OMNIPAQUE IOHEXOL 350 MG/ML SOLN COMPARISON:  CT neck, 11/04/2022, CT pelvis, 02/09/2017 FINDINGS: CT CHEST FINDINGS Cardiovascular: No significant vascular findings. Normal heart size. No pericardial effusion. Mediastinum/Nodes: Enlarged right lower cervical, supraclavicular, and bilateral axillary lymph nodes, largest right axillary nodes measuring up to 2.2 x 1.7 cm (series 3, image 18). Diffuse fat stranding throughout the superior mediastinum (series 3, image 19), thyroid gland, trachea, and esophagus demonstrate no significant findings. Lungs/Pleura: Lungs are clear. No pleural effusion or pneumothorax. Musculoskeletal: Partially imaged, heterogeneous mass centered in the anterior right neck, incompletely imaged and better assessed by simultaneous dedicated examination of the neck (series 3, image 1). No acute osseous findings. CT ABDOMEN PELVIS FINDINGS Hepatobiliary: No solid liver abnormality is seen. No gallstones, gallbladder wall thickening, or biliary dilatation. Pancreas: Unremarkable. No pancreatic ductal dilatation or surrounding inflammatory changes. Spleen: Normal in size without significant abnormality. Adrenals/Urinary Tract: Adrenal glands are  unremarkable. Kidneys are normal, without renal calculi, solid lesion, or hydronephrosis. Bladder is unremarkable. Stomach/Bowel: Stomach is within normal limits. Appendix appears normal. No evidence of bowel wall thickening, distention, or inflammatory changes. Vascular/Lymphatic: No significant vascular findings are present. Prominent subcentimeter retroperitoneal and iliac lymph nodes, for example a right external iliac node measuring 1.1 x 0.8 cm (series 3, image 102). Reproductive: No mass or other abnormality. Other: No abdominal wall hernia or abnormality. No ascites. Diffuse retroperitoneal fat stranding (series 3, image 80). Musculoskeletal: No acute osseous findings. IMPRESSION: 1. Partially imaged, heterogeneous mass centered in the anterior right neck, incompletely imaged and better assessed by simultaneous dedicated examination of the neck. As previously reported, general differential considerations include both malignancy and infectious process such as scrofula. 2. Enlarged right lower cervical, supraclavicular, and bilateral axillary lymph nodes, which may be reactive although are generally concerning for metastatic disease. 3. Prominent subcentimeter retroperitoneal and iliac lymph nodes, nonspecific although as  above concerning for additional nodal metastatic disease. 4. Diffuse fat stranding throughout the superior mediastinum, of uncertain nature, possibly reflecting mediastinal infectious involvement or alternately vascular/lymphatic congestion. Prior thoracic radiotherapy could likewise have this appearance. 5. Diffuse retroperitoneal fat stranding, as above of uncertain significance, possibly infectious or inflammatory, for example incidental and related to pancreatitis, or as above perhaps related to prior abdominal radiotherapy. 6. No mass or evidence of organ metastatic disease in the abdomen or pelvis. Electronically Signed   By: Delanna Ahmadi M.D.   On: 11/10/2022 16:26   DG Chest 2  View  Result Date: 11/10/2022 CLINICAL DATA:  Dyspnea EXAM: CHEST - 2 VIEW COMPARISON:  06/27/2016 FINDINGS: Small areas of consolidation or volume loss identified right middle lobe and lingula. No pneumothorax or pleural effusion. Normal pulmonary vasculature. Unremarkable cardiac silhouette. Unremarkable osseous structures. IMPRESSION: Medial bibasilar subsegmental atelectasis or consolidation. Electronically Signed   By: Sammie Bench M.D.   On: 11/10/2022 14:53   CT SOFT TISSUE NECK W CONTRAST  Result Date: 11/05/2022 CLINICAL DATA:  Neck mass. EXAM: CT NECK WITH CONTRAST TECHNIQUE: Multidetector CT imaging of the neck was performed using the standard protocol following the bolus administration of intravenous contrast. RADIATION DOSE REDUCTION: This exam was performed according to the departmental dose-optimization program which includes automated exposure control, adjustment of the mA and/or kV according to patient size and/or use of iterative reconstruction technique. CONTRAST:  90m OMNIPAQUE IOHEXOL 350 MG/ML SOLN COMPARISON:  CT Neck 07/23/22 FINDINGS: Pharynx and larynx: Normal. No mass or swelling. Salivary glands: No inflammation, mass, or stone. Thyroid: Normal. Lymph nodes: There are prominent lymph nodes, for example a 1.2 cm level 1 a lymph node, unchanged from prior exam. There is also a prominent 8 mm right level 3 lymph, slightly increased in size from prior exam. Along the inferior aspect of the right submandibular gland there is a 2.8 x 1.8 cm lesion with central hypodensity (series 3, image 56) which could represent a centrally necrotic lymph node. Vascular: There is mass effect on the right internal jugular vein (series 3, image 68). Limited intracranial: Negative. Visualized orbits: Negative. Mastoids and visualized paranasal sinuses: Clear. Skeleton: No acute or aggressive process. Upper chest: See below.  No focal pulmonary nodule is visualized. Other: There is marked interval  worsening in the degree soft tissue abnormality seen in the right neck compared to prior exam dated 07/23/2022. There is now a large soft tissue mass along the anterior chest wall, adjacent to the sternoclavicular joint on the right measuring 4.2 x 3 4 x 5.6 cm (series 3, image 5). There is infiltrative soft tissue surrounding this mass that extends superiorly along the supraclavicular region to the level of the hyoid bone and submandibular region (series 3, image 64). Inferiorly this mass likely now also extends into the mediastinum where there is soft tissue stranding (series 3, image 115). Infiltrative soft tissues also seen along the left supraclavicular region (series 3, image 77), but to a lesser degree than the right. Portions of this mass appear to have a cutaneous component, for example along the right aspect of the chin (series 3, image 59), as well as the portion adjacent to the right sternoclavicular joint (series 3, image 79). IMPRESSION: 1. Marked interval worsening in the degree of soft tissue abnormality seen in the right neck compared to prior exam dated 07/23/2022. There is now a multi spatial infiltrative soft tissue mass involving a large portion of the right neck. Inferiorly this mass extends into the  mediastinum and superiorly this mass extends to the submandibular region. Infiltrative soft tissue is also seen along the left supraclavicular region. Portions of this mass appear to have a cutaneous component, for example along the right aspect of the chin, as well as the right sternoclavicular joint. Findings are concerning for malignancy, with differential considerations including lymphoma or sarcoma. Tissue sampling is recommended. Additionally CT of the chest is also recommended to fully characterize the mediastinal extent of this mass. 2. 2.8 x 1.8 cm lesion with central hypodensity along the inferior aspect of the right submandibular gland could represent a centrally necrotic lymph node.  Electronically Signed   By: Marin Roberts M.D.   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 Concerning for at least stage II possibly stage III disease. Some borderline lymphadenopathy in the abdomen could also be from his recently diagnosed HIV/AIDS. No clinical symptoms suggestive of CNS involvement at this time. No constitutional symptoms. No significant cytopenias to suggest bone marrow involvement. Discussed with patient and he is not interested in fertility preservation or sperm banking. First presented in August at [facility] and was treated empirically with steroids and antibiotics with improvement in swelling prior to additional progression.   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  Patient reports this was completely treated.   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  On allopurinol for TLS prophylaxis  PLAN: -discussed lab results today with the patient. CBC shows WBC of 3.4 K. CMP shows sodium level of 134, AST of 10.  -Patient notes no acute toxicities from cycle 1 of daEPOCH-R chemotherapy for his newly diagnosed Burkitt's lymphoma. -Continue allopurinol for TLS prophylaxis at least till the second cycle of treatment. -Recommend staying hydrated to minimize headaches.  -Prescribed Fioricet for headache.  -Recommend cutting down on caffeine. -Patient will report to [place] if his headaches do not improve with medication.  He is receiving Rituxan today and tolerating this well. He is receiving Udencya today  FOLLOW-UP: RTC with portflush and labs and visit with Murray Hodgkins in 1 week for toxicity  check Inpatient admission for C2 of EPOCH-R on 12/10/2022  The total time spent in the appointment was 32 minutes* .  All of the patient's questions were answered with apparent satisfaction. The patient knows to call the clinic with any problems, questions or concerns.   Sullivan Lone MD MS AAHIVMS [facility] Hematology/Oncology Physician [facility]  .*Total Encounter Time as defined by the Centers for Medicare and Medicaid Services includes, in addition to the face-to-face time of a patient visit (documented in the note above) non-face-to-face time: obtaining and reviewing outside history, ordering and reviewing medications, tests or procedures, care coordination (communications with other health care professionals or caregivers) and documentation in the medical record.   I, Cleda Mccreedy, am acting as a Education administrator for Sullivan Lone, MD.  .I have reviewed the above documentation for accuracy and completeness, and I agree with the above. Brunetta Genera MD

## 2022-11-25 NOTE — Progress Notes (Signed)
Per Dr Lindi Adie, no labs needed today however labs were drawn and resulted.  Ok to proceed with elevated bili 1.6 mg/dL  Pt received 1st doses in-patient and is here today for 1st time rituxamab with udenyca.

## 2022-11-25 NOTE — Patient Instructions (Signed)
[facility]  Discharge Instructions: Thank you for choosing [facility] to provide your oncology and hematology care.   If you have a lab appointment with the [facility], please go directly to the [facility] and check in at the registration area.   Wear comfortable clothing and clothing appropriate for easy access to any Portacath or PICC line.   We strive to give you quality time with your provider. You may need to reschedule your appointment if you arrive late (15 or more minutes).  Arriving late affects you and other patients whose appointments are after yours.  Also, if you miss three or more appointments without notifying the office, you may be dismissed from the clinic at the provider's discretion.      For prescription refill requests, have your pharmacy contact our office and allow 72 hours for refills to be completed.    Today you received the following chemotherapy and/or immunotherapy agents rituxamab      To help prevent nausea and vomiting after your treatment, we encourage you to take your nausea medication as directed.  BELOW ARE SYMPTOMS THAT SHOULD BE REPORTED IMMEDIATELY: *FEVER GREATER THAN 100.4 F (38 C) OR HIGHER *CHILLS OR SWEATING *NAUSEA AND VOMITING THAT IS NOT CONTROLLED WITH YOUR NAUSEA MEDICATION *UNUSUAL SHORTNESS OF BREATH *UNUSUAL BRUISING OR BLEEDING *URINARY PROBLEMS (pain or burning when urinating, or frequent urination) *BOWEL PROBLEMS (unusual diarrhea, constipation, pain near the anus) TENDERNESS IN MOUTH AND THROAT WITH OR WITHOUT PRESENCE OF ULCERS (sore throat, sores in mouth, or a toothache) UNUSUAL RASH, SWELLING OR PAIN  UNUSUAL VAGINAL DISCHARGE OR ITCHING   Items with * indicate a potential emergency and should be followed up as soon as possible or go to the Emergency Department if any problems should occur.  Please show the CHEMOTHERAPY ALERT CARD or IMMUNOTHERAPY ALERT CARD at check-in to  the Emergency Department and triage nurse.  Should you have questions after your visit or need to cancel or reschedule your appointment, please contact [facility]  Dept: [phone]  and follow the prompts.  Office hours are 8:00 a.m. to 4:30 p.m. Monday - Friday. Please note that voicemails left after 4:00 p.m. may not be returned until the following business day.  We are closed weekends and major holidays. You have access to a nurse at all times for urgent questions. Please call the main number to the clinic Dept: [phone] and follow the prompts.   For any non-urgent questions, you may also contact your provider using MyChart. We now offer e-Visits for anyone 51 and older to request care online for non-urgent symptoms. For details visit [email or web address].   Also download the MyChart app! Go to the app store, search "MyChart", open the app, select [facility], and log in with your MyChart username and password.  Masks are optional in the cancer centers. If you would like for your care team to wear a mask while they are taking care of you, please let them know. You may have one support person who is at least 39 years old accompany you for your appointments. Rituximab Injection What is this medication? RITUXIMAB (ri TUX i mab) treats leukemia and lymphoma. It works by blocking a protein that causes cancer cells to grow and multiply. This helps to slow or stop the spread of cancer cells. It may also be used to treat autoimmune conditions, such as arthritis. It works by slowing down an overactive immune system. It  is a monoclonal antibody. This medicine may be used for other purposes; ask your health care provider or pharmacist if you have questions. COMMON BRAND NAME(S): RIABNI, Rituxan, RUXIENCE, truxima What should I tell my care team before I take this medication? They need to know if you have any of these conditions: Chest pain Heart disease Immune  system problems Infection, such as chickenpox, cold sores, hepatitis B, herpes Irregular heartbeat or rhythm Kidney disease Low blood counts, such as low white cells, platelets, red cells Lung disease Recent or upcoming vaccine An unusual or allergic reaction to rituximab, other medications, foods, dyes, or preservatives Pregnant or trying to get pregnant Breast-feeding How should I use this medication? This medication is injected into a vein. It is given by a care team in a hospital or clinic setting. A special MedGuide will be given to you before each treatment. Be sure to read this information carefully each time. Talk to your care team about the use of this medication in children. While this medication may be prescribed for children as young as 6 months for selected conditions, precautions do apply. Overdosage: If you think you have taken too much of this medicine contact a poison control center or emergency room at once. NOTE: This medicine is only for you. Do not share this medicine with others. What if I miss a dose? Keep appointments for follow-up doses. It is important not to miss your dose. Call your care team if you are unable to keep an appointment. What may interact with this medication? Do not take this medication with any of the following: Live vaccines This medication may also interact with the following: Cisplatin This list may not describe all possible interactions. Give your health care provider a list of all the medicines, herbs, non-prescription drugs, or dietary supplements you use. Also tell them if you smoke, drink alcohol, or use illegal drugs. Some items may interact with your medicine. What should I watch for while using this medication? Your condition will be monitored carefully while you are receiving this medication. You may need blood work while taking this medication. This medication can cause serious infusion reactions. To reduce the risk your care team may  give you other medications to take before receiving this one. Be sure to follow the directions from your care team. This medication may increase your risk of getting an infection. Call your care team for advice if you get a fever, chills, sore throat, or other symptoms of a cold or flu. Do not treat yourself. Try to avoid being around people who are sick. Call your care team if you are around anyone with measles, chickenpox, or if you develop sores or blisters that do not heal properly. Avoid taking medications that contain aspirin, acetaminophen, ibuprofen, naproxen, or ketoprofen unless instructed by your care team. These medications may hide a fever. This medication may cause serious skin reactions. They can happen weeks to months after starting the medication. Contact your care team right away if you notice fevers or flu-like symptoms with a rash. The rash may be red or purple and then turn into blisters or peeling of the skin. You may also notice a red rash with swelling of the face, lips, or lymph nodes in your neck or under your arms. In some patients, this medication may cause a serious brain infection that may cause death. If you have any problems seeing, thinking, speaking, walking, or standing, tell your care team right away. If you cannot reach your care  team, urgently seek another source of medical care. Talk to your care team if you may be pregnant. Serious birth defects can occur if you take this medication during pregnancy and for 12 months after the last dose. You will need a negative pregnancy test before starting this medication. Contraception is recommended while taking this medication and for 12 months after the last dose. Your care team can help you find the option that works for you. Do not breastfeed while taking this medication and for at least 6 months after the last dose. What side effects may I notice from receiving this medication? Side effects that you should report to your  care team as soon as possible: Allergic reactions or angioedema--skin rash, itching or hives, swelling of the face, eyes, lips, tongue, arms, or legs, trouble swallowing or breathing Bowel blockage--stomach cramping, unable to have a bowel movement or pass gas, loss of appetite, vomiting Dizziness, loss of balance or coordination, confusion or trouble speaking Heart attack--pain or tightness in the chest, shoulders, arms, or jaw, nausea, shortness of breath, cold or clammy skin, feeling faint or lightheaded Heart rhythm changes--fast or irregular heartbeat, dizziness, feeling faint or lightheaded, chest pain, trouble breathing Infection--fever, chills, cough, sore throat, wounds that don't heal, pain or trouble when passing urine, general feeling of discomfort or being unwell Infusion reactions--chest pain, shortness of breath or trouble breathing, feeling faint or lightheaded Kidney injury--decrease in the amount of urine, swelling of the ankles, hands, or feet Liver injury--right upper belly pain, loss of appetite, nausea, light-colored stool, dark yellow or brown urine, yellowing skin or eyes, unusual weakness or fatigue Redness, blistering, peeling, or loosening of the skin, including inside the mouth Stomach pain that is severe, does not go away, or gets worse Tumor lysis syndrome (TLS)--nausea, vomiting, diarrhea, decrease in the amount of urine, dark urine, unusual weakness or fatigue, confusion, muscle pain or cramps, fast or irregular heartbeat, joint pain Side effects that usually do not require medical attention (report to your care team if they continue or are bothersome): Headache Joint pain Nausea Runny or stuffy nose Unusual weakness or fatigue This list may not describe all possible side effects. Call your doctor for medical advice about side effects. You may report side effects to FDA at 1-800-FDA-1088. Where should I keep my medication? This medication is given in a hospital or  clinic. It will not be stored at home. NOTE: This sheet is a summary. It may not cover all possible information. If you have questions about this medicine, talk to your doctor, pharmacist, or health care provider.  2023 [employer] Standard (2022-04-16 00:00:00)

## 2022-11-26 ENCOUNTER — Encounter (HOSPITAL_COMMUNITY): Payer: Self-pay | Admitting: Hematology

## 2022-11-26 ENCOUNTER — Telehealth: Payer: Self-pay

## 2022-11-26 NOTE — Telephone Encounter (Signed)
-----   Message from Dionne Ano, RN sent at 11/25/2022 10:16 AM EST ----- Regarding: Follow up 1st time Rituxan Dr Irene Limbo 11/26/2022 Pt received in-patient chemo

## 2022-11-26 NOTE — Telephone Encounter (Signed)
[facility] stated that he is doing fine. He is a little fatigued. He is eating, drinking and urinating well. He knows to call the office at [phone] if he has any questions or concerns.

## 2022-11-27 ENCOUNTER — Encounter (HOSPITAL_COMMUNITY): Payer: Self-pay

## 2022-11-29 ENCOUNTER — Other Ambulatory Visit (HOSPITAL_COMMUNITY): Payer: Self-pay

## 2022-11-29 ENCOUNTER — Other Ambulatory Visit: Payer: Self-pay | Admitting: Family

## 2022-11-29 ENCOUNTER — Other Ambulatory Visit: Payer: Self-pay

## 2022-11-29 DIAGNOSIS — B20 Human immunodeficiency virus [HIV] disease: Secondary | ICD-10-CM

## 2022-11-29 MED ORDER — BIKTARVY 50-200-25 MG PO TABS
1.0000 | ORAL_TABLET | Freq: Every day | ORAL | 0 refills | Status: DC
  Filled 2022-11-29: qty 30, 30d supply, fill #0

## 2022-11-29 NOTE — Progress Notes (Unsigned)
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 gentleman with history of lymphoma presents to IR for biopsy of right neck mass EXAM: Ultrasound-guided biopsy of right neck mass MEDICATIONS: None. ANESTHESIA/SEDATION: Moderate (conscious) sedation was employed during this procedure. A total of Versed 2.5 mg and Fentanyl 75 mcg was administered intravenously. Moderate Sedation Time: 10 minutes. The patient's level of consciousness and vital signs were monitored continuously by radiology nursing throughout the procedure under my direct supervision. COMPLICATIONS: None immediate. PROCEDURE: Informed written consent was obtained from the patient after a thorough discussion of the procedural risks, benefits and alternatives. All questions were addressed. Maximal Sterile Barrier Technique was utilized including caps, mask, sterile gowns, sterile gloves, sterile drape, hand hygiene and skin antiseptic. A timeout was performed prior to the initiation of the procedure. Patient position supine on the ultrasound table. Right neck skin prepped and draped in usual sterile fashion. Following local lidocaine administration, 5- 18 gauge cores were obtained  from the right neck mass utilizing continuous ultrasound guidance. Two cores were sent for Gram stain and culture  in sterile saline. Three cores were sent for histologic analysis in formalin. Needle removed and hemostasis achieved with 5 minutes of manual compression. Post procedure ultrasound images showed no evidence of significant hemorrhage. IMPRESSION: Ultrasound-guided biopsy of right neck mass. Electronically Signed   By: Miachel Roux M.D.   On: 11/12/2022 17:17   CT Soft Tissue Neck W Contrast  Result Date: 11/10/2022 CLINICAL DATA:  Neck mass with history of HIV. EXAM: CT NECK WITH CONTRAST TECHNIQUE: Multidetector CT imaging of the neck was performed using the standard protocol following the bolus administration of intravenous contrast. RADIATION DOSE REDUCTION: This exam was performed according to the departmental dose-optimization program which includes automated exposure control, adjustment of the mA and/or kV according to patient size and/or use of iterative reconstruction technique. CONTRAST:  13m OMNIPAQUE IOHEXOL 350 MG/ML SOLN COMPARISON:  CT neck 6 days ago. FINDINGS: Pharynx and larynx: The nasal cavity and nasopharynx are unremarkable. The oral cavity and oropharynx are unremarkable. The parapharyngeal spaces are clear. The hypopharynx and larynx are unremarkable. The vocal folds are normal in appearance. There is no retropharyngeal fluid collection.  The airway is patent. Salivary glands: The parotid and submandibular glands are unremarkable. Thyroid: Unremarkable. Lymph nodes: There is enlarged centrally necrotic right level I lymph node measuring up to 2.1 cm in short axis, similar to the CT from 6 days ago. The large infiltrative centrally necrotic soft tissue mass throughout the right neck measuring up to approximately 8.1 cm TV x 5.9 cm AP by 7.4 cm cc is also not significantly changed in size. Inferior extent into the upper chest anterior to the thyroid with centrally necrotic  appearing tissue measuring 4.9 cm x 5.4 cm is unchanged when measured again using similar technique. Overlying skin thickening is similar. The mass is inseparable from the sternocleidomastoid muscle. There are numerous additional prominent right-sided cervical chain lymph nodes measuring up to 1.0 cm at level IIA (3-38), and 1.1 cm in short axis in the posterior triangle (3-43). Vascular: The major vasculature of the neck is unremarkable. Limited intracranial: The imaged portions of the posterior fossa are unremarkable. Visualized orbits: Not included within the field of view. Mastoids and visualized paranasal sinuses: Clear. Skeleton: There is no acute osseous abnormality or suspicious osseous lesion. Upper chest: Assessed on the separately dictated CT chest. Other: None. IMPRESSION: No significant interval change since the CT neck from 6 days prior. Unchanged infiltrative soft tissue mass in the right neck extending to the upper chest anterior to the clavicle with central necrosis and surrounding lymphadenopathy. Differential again includes malignancy (sarcoma, lymphoma), or infection (including TB. Sampling is recommended. Electronically Signed   By: PValetta MoleM.D.   On: 11/10/2022 16:27   CT CHEST ABDOMEN PELVIS W CONTRAST  Result Date: 11/10/2022 CLINICAL DATA:  Lymphoma, neck mass, metastatic disease evaluation * Tracking Code: BO * EXAM: CT CHEST, ABDOMEN, AND PELVIS WITH CONTRAST TECHNIQUE: Multidetector CT imaging of the chest, abdomen and pelvis was performed following the standard protocol during bolus administration of intravenous contrast. RADIATION DOSE REDUCTION: This exam was performed according to the departmental dose-optimization program which includes automated exposure control, adjustment of the mA and/or kV according to patient size and/or use of iterative reconstruction technique. CONTRAST:  1066mOMNIPAQUE IOHEXOL 350 MG/ML SOLN COMPARISON:  CT neck, 11/04/2022, CT pelvis, 02/09/2017  FINDINGS: CT CHEST FINDINGS Cardiovascular: No significant vascular findings. Normal heart size. No pericardial effusion. Mediastinum/Nodes: Enlarged right lower cervical, supraclavicular, and bilateral axillary lymph nodes, largest right axillary nodes  measuring up to 2.2 x 1.7 cm (series 3, image 18). Diffuse fat stranding throughout the superior mediastinum (series 3, image 19), thyroid gland, trachea, and esophagus demonstrate no significant findings. Lungs/Pleura: Lungs are clear. No pleural effusion or pneumothorax. Musculoskeletal: Partially imaged, heterogeneous mass centered in the anterior right neck, incompletely imaged and better assessed by simultaneous dedicated examination of the neck (series 3, image 1). No acute osseous findings. CT ABDOMEN PELVIS FINDINGS Hepatobiliary: No solid liver abnormality is seen. No gallstones, gallbladder wall thickening, or biliary dilatation. Pancreas: Unremarkable. No pancreatic ductal dilatation or surrounding inflammatory changes. Spleen: Normal in size without significant abnormality. Adrenals/Urinary Tract: Adrenal glands are unremarkable. Kidneys are normal, without renal calculi, solid lesion, or hydronephrosis. Bladder is unremarkable. Stomach/Bowel: Stomach is within normal limits. Appendix appears normal. No evidence of bowel wall thickening, distention, or inflammatory changes. Vascular/Lymphatic: No significant vascular findings are present. Prominent subcentimeter retroperitoneal and iliac lymph nodes, for example a right external iliac node measuring 1.1 x 0.8 cm (series 3, image 102). Reproductive: No mass or other abnormality. Other: No abdominal wall hernia or abnormality. No ascites. Diffuse retroperitoneal fat stranding (series 3, image 80). Musculoskeletal: No acute osseous findings. IMPRESSION: 1. Partially imaged, heterogeneous mass centered in the anterior right neck, incompletely imaged and better assessed by simultaneous dedicated examination of  the neck. As previously reported, general differential considerations include both malignancy and infectious process such as scrofula. 2. Enlarged right lower cervical, supraclavicular, and bilateral axillary lymph nodes, which may be reactive although are generally concerning for metastatic disease. 3. Prominent subcentimeter retroperitoneal and iliac lymph nodes, nonspecific although as above concerning for additional nodal metastatic disease. 4. Diffuse fat stranding throughout the superior mediastinum, of uncertain nature, possibly reflecting mediastinal infectious involvement or alternately vascular/lymphatic congestion. Prior thoracic radiotherapy could likewise have this appearance. 5. Diffuse retroperitoneal fat stranding, as above of uncertain significance, possibly infectious or inflammatory, for example incidental and related to pancreatitis, or as above perhaps related to prior abdominal radiotherapy. 6. No mass or evidence of organ metastatic disease in the abdomen or pelvis. Electronically Signed   By: Delanna Ahmadi M.D.   On: 11/10/2022 16:26   DG Chest 2 View  Result Date: 11/10/2022 CLINICAL DATA:  Dyspnea EXAM: CHEST - 2 VIEW COMPARISON:  06/27/2016 FINDINGS: Small areas of consolidation or volume loss identified right middle lobe and lingula. No pneumothorax or pleural effusion. Normal pulmonary vasculature. Unremarkable cardiac silhouette. Unremarkable osseous structures. IMPRESSION: Medial bibasilar subsegmental atelectasis or consolidation. Electronically Signed   By: Sammie Bench M.D.   On: 11/10/2022 14:53   CT SOFT TISSUE NECK W CONTRAST  Result Date: 11/05/2022 CLINICAL DATA:  Neck mass. EXAM: CT NECK WITH CONTRAST TECHNIQUE: Multidetector CT imaging of the neck was performed using the standard protocol following the bolus administration of intravenous contrast. RADIATION DOSE REDUCTION: This exam was performed according to the departmental dose-optimization program which  includes automated exposure control, adjustment of the mA and/or kV according to patient size and/or use of iterative reconstruction technique. CONTRAST:  54m OMNIPAQUE IOHEXOL 350 MG/ML SOLN COMPARISON:  CT Neck 07/23/22 FINDINGS: Pharynx and larynx: Normal. No mass or swelling. Salivary glands: No inflammation, mass, or stone. Thyroid: Normal. Lymph nodes: There are prominent lymph nodes, for example a 1.2 cm level 1 a lymph node, unchanged from prior exam. There is also a prominent 8 mm right level 3 lymph, slightly increased in size from prior exam. Along the inferior aspect of the right submandibular gland there is a 2.8  x 1.8 cm lesion with central hypodensity (series 3, image 56) which could represent a centrally necrotic lymph node. Vascular: There is mass effect on the right internal jugular vein (series 3, image 68). Limited intracranial: Negative. Visualized orbits: Negative. Mastoids and visualized paranasal sinuses: Clear. Skeleton: No acute or aggressive process. Upper chest: See below.  No focal pulmonary nodule is visualized. Other: There is marked interval worsening in the degree soft tissue abnormality seen in the right neck compared to prior exam dated 07/23/2022. There is now a large soft tissue mass along the anterior chest wall, adjacent to the sternoclavicular joint on the right measuring 4.2 x 3 4 x 5.6 cm (series 3, image 5). There is infiltrative soft tissue surrounding this mass that extends superiorly along the supraclavicular region to the level of the hyoid bone and submandibular region (series 3, image 64). Inferiorly this mass likely now also extends into the mediastinum where there is soft tissue stranding (series 3, image 115). Infiltrative soft tissues also seen along the left supraclavicular region (series 3, image 77), but to a lesser degree than the right. Portions of this mass appear to have a cutaneous component, for example along the right aspect of the chin (series 3, image  59), as well as the portion adjacent to the right sternoclavicular joint (series 3, image 79). IMPRESSION: 1. Marked interval worsening in the degree of soft tissue abnormality seen in the right neck compared to prior exam dated 07/23/2022. There is now a multi spatial infiltrative soft tissue mass involving a large portion of the right neck. Inferiorly this mass extends into the mediastinum and superiorly this mass extends to the submandibular region. Infiltrative soft tissue is also seen along the left supraclavicular region. Portions of this mass appear to have a cutaneous component, for example along the right aspect of the chin, as well as the right sternoclavicular joint. Findings are concerning for malignancy, with differential considerations including lymphoma or sarcoma. Tissue sampling is recommended. Additionally CT of the chest is also recommended to fully characterize the mediastinal extent of this mass. 2. 2.8 x 1.8 cm lesion with central hypodensity along the inferior aspect of the right submandibular gland could represent a centrally necrotic lymph node. Electronically Signed   By: Marin Roberts M.D.   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 Concerning for at least stage II possibly stage III disease. Some borderline lymphadenopathy in the abdomen could also be from his recently diagnosed HIV/AIDS. No clinical symptoms suggestive of CNS involvement at this time. Brain MRI negative.  No constitutional symptoms. No significant cytopenias to suggest bone marrow involvement. Discussed with patient and he is not interested in fertility preservation or sperm banking. First presented in August at [facility] and was treated empirically with steroids and antibiotics with improvement in swelling prior to additional progression. -Currently on dose adjusted EPOCH-R. Status post 1 cycle. Tolerated well overall.  -Scheduled for cycle #2 on 12/10/22. Will help facilitate inpatient admission. -Outpatient follow up on 12/17/22 for Rituxan and Neulasta   #2 HIV/AIDS recently diagnosed 3 to 4 months ago. Following Dr. Elna Breslow from [place] and currently taking Biktarvy.   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  Patient reports this was  completely treated.   #4 spontaneous tumor lysis syndrome: uric acid *** today and LDH ***.  On allopurinol for TLS prophylaxis  #5 Prophylaxis: Continue salt water and baking soda mouthwash rinses 4 times a day to reduce mucositis.  MiraLAX and senna as for constipation Bactrim for prophylactic PJP.      PLAN: -discussed lab results today with the patient. CBC shows WBC of *** K. CMP shows *** -Patient notes no acute toxicities from cycle 1 day 5 of daEPOCH-R chemotherapy or Rituxan for his newly diagnosed Burkitt's lymphoma. -Continue allopurinol for TLS prophylaxis at least till the second cycle of treatment. -Recommend staying hydrated to minimize headaches.  -Arrange Cycle #2 admission and next outpatient follow up for Rituxan and Neulasta on 12/17/22    FOLLOW-UP: Inpatient admission for C2 of EPOCH-R on 12/10/2022 -Arrange labs, follow up, and Rituxan cycle #2 outpatient on 12/17/22     No orders of the defined types were placed in this encounter.    I spent {CHL ONC TIME VISIT - NOTRR:[id number]} counseling the patient face to face. The total time spent in the appointment was {CHL ONC TIME VISIT - FXOVA:[id number]}.  Jayin Derousse L Leyana Whidden, PA-C 11/29/22

## 2022-12-01 ENCOUNTER — Encounter: Payer: Self-pay | Admitting: Hematology

## 2022-12-02 ENCOUNTER — Other Ambulatory Visit: Payer: Self-pay

## 2022-12-02 ENCOUNTER — Other Ambulatory Visit (HOSPITAL_COMMUNITY): Payer: Self-pay

## 2022-12-02 ENCOUNTER — Inpatient Hospital Stay (HOSPITAL_BASED_OUTPATIENT_CLINIC_OR_DEPARTMENT_OTHER): Payer: 59 | Admitting: Physician Assistant

## 2022-12-02 ENCOUNTER — Telehealth: Payer: Self-pay

## 2022-12-02 ENCOUNTER — Inpatient Hospital Stay: Payer: 59

## 2022-12-02 ENCOUNTER — Encounter (HOSPITAL_COMMUNITY): Payer: Self-pay

## 2022-12-02 VITALS — BP 119/77 | HR 97 | Temp 97.8°F | Resp 18 | Ht 73.0 in | Wt 268.2 lb

## 2022-12-02 DIAGNOSIS — D696 Thrombocytopenia, unspecified: Secondary | ICD-10-CM

## 2022-12-02 DIAGNOSIS — C8378 Burkitt lymphoma, lymph nodes of multiple sites: Secondary | ICD-10-CM

## 2022-12-02 DIAGNOSIS — R630 Anorexia: Secondary | ICD-10-CM | POA: Diagnosis not present

## 2022-12-02 DIAGNOSIS — G4700 Insomnia, unspecified: Secondary | ICD-10-CM | POA: Diagnosis not present

## 2022-12-02 DIAGNOSIS — Z95828 Presence of other vascular implants and grafts: Secondary | ICD-10-CM

## 2022-12-02 DIAGNOSIS — Z7189 Other specified counseling: Secondary | ICD-10-CM

## 2022-12-02 DIAGNOSIS — Z5112 Encounter for antineoplastic immunotherapy: Secondary | ICD-10-CM | POA: Diagnosis not present

## 2022-12-02 LAB — CMP (CANCER CENTER ONLY)
ALT: 41 U/L (ref 0–44)
AST: 19 U/L (ref 15–41)
Albumin: 3.5 g/dL (ref 3.5–5.0)
Alkaline Phosphatase: 64 U/L (ref 38–126)
Anion gap: 6 (ref 5–15)
BUN: 16 mg/dL (ref 6–20)
CO2: 28 mmol/L (ref 22–32)
Calcium: 8.8 mg/dL — ABNORMAL LOW (ref 8.9–10.3)
Chloride: 102 mmol/L (ref 98–111)
Creatinine: 0.88 mg/dL (ref 0.61–1.24)
GFR, Estimated: >60 mL/min (ref >60)
Glucose, Bld: 135 mg/dL — ABNORMAL HIGH (ref 70–99)
Potassium: 3.8 mmol/L (ref 3.5–5.1)
Sodium: 136 mmol/L (ref 135–145)
Total Bilirubin: 0.5 mg/dL (ref 0.3–1.2)
Total Protein: 5.8 g/dL — ABNORMAL LOW (ref 6.5–8.1)

## 2022-12-02 LAB — CBC WITH DIFFERENTIAL (CANCER CENTER ONLY)
Abs Immature Granulocytes: 1.7 10*3/uL — ABNORMAL HIGH (ref 0.00–0.07)
Band Neutrophils: 14 %
Basophils Absolute: 0.1 10*3/uL (ref 0.0–0.1)
Basophils Relative: 1 %
Blasts: 1 %
Eosinophils Absolute: 0 10*3/uL (ref 0.0–0.5)
Eosinophils Relative: 0 %
HCT: 35.9 % — ABNORMAL LOW (ref 39.0–52.0)
Hemoglobin: 11.9 g/dL — ABNORMAL LOW (ref 13.0–17.0)
Lymphocytes Relative: 25 %
Lymphs Abs: 2.4 10*3/uL (ref 0.7–4.0)
MCH: 27.2 pg (ref 26.0–34.0)
MCHC: 33.1 g/dL (ref 30.0–36.0)
MCV: 82 fL (ref 80.0–100.0)
Metamyelocytes Relative: 14 %
Monocytes Absolute: 0.4 10*3/uL (ref 0.1–1.0)
Monocytes Relative: 4 %
Myelocytes: 3 %
Neutro Abs: 4.8 10*3/uL (ref 1.7–7.7)
Neutrophils Relative %: 37 %
Platelet Count: 74 10*3/uL — ABNORMAL LOW (ref 150–400)
Promyelocytes Relative: 1 %
RBC: 4.38 MIL/uL (ref 4.22–5.81)
RDW: 14.9 % (ref 11.5–15.5)
WBC Count: 9.5 10*3/uL (ref 4.0–10.5)
nRBC: 3.5 % — ABNORMAL HIGH (ref 0.0–0.2)

## 2022-12-02 LAB — URIC ACID: Uric Acid, Serum: 7.1 mg/dL (ref 3.7–8.6)

## 2022-12-02 LAB — MAGNESIUM: Magnesium: 1.8 mg/dL (ref 1.7–2.4)

## 2022-12-02 MED ORDER — SODIUM CHLORIDE 0.9% FLUSH
10.0000 mL | INTRAVENOUS | Status: AC | PRN
  Administered 2022-12-02: 10 mL

## 2022-12-02 MED ORDER — PROCHLORPERAZINE MALEATE 10 MG PO TABS: 10.0000 mg | ORAL_TABLET | Freq: Four times a day (QID) | ORAL | 2 refills | Status: DC | PRN

## 2022-12-02 MED ORDER — HEPARIN SOD (PORK) LOCK FLUSH 100 UNIT/ML IV SOLN
500.0000 [IU] | INTRAVENOUS | Status: AC | PRN
  Administered 2022-12-02: 500 [IU]

## 2022-12-02 MED ORDER — MIRTAZAPINE 15 MG PO TABS: 15.0000 mg | ORAL_TABLET | Freq: Every day | ORAL | 2 refills | Status: DC

## 2022-12-02 NOTE — Addendum Note (Signed)
Addended by: Gracelyn Nurse on: 12/02/2022 01:06 PM   Modules accepted: Orders

## 2022-12-02 NOTE — Telephone Encounter (Signed)
This nurse reached out to patient and made him aware that the provider has ordered a repeat lab appointment for this Friday.  Explained that the provider wants to make sure that his platelet count recovers before treatment next week.  Patient acknowledged understanding.  No further questions or concerns noted.

## 2022-12-02 NOTE — Progress Notes (Signed)
[place] Work  Initial Assessment   [facility] is a 41 y.o. year old male accompanied by his spouse, Logan Ward.  Clinical Social Work was referred by medical provider for assessment of psychosocial needs.   SDOH (Social Determinants of Health) assessments performed: No SDOH Interventions    Flowsheet Row Clinical Support from 10/24/2022 in [facility] for Infectious Disease  SDOH Interventions   Food Insecurity Interventions Intervention Not Indicated  Housing Interventions Intervention Not Indicated  Transportation Interventions Intervention Not Indicated  Utilities Interventions Intervention Not Indicated  Alcohol Usage Interventions Intervention Not Indicated (Score <7)  Financial Strain Interventions Intervention Not Indicated  Physical Activity Interventions Intervention Not Indicated  Stress Interventions Intervention Not Indicated  Social Connections Interventions Intervention Not Indicated       SDOH Screenings   Food Insecurity: No Food Insecurity (11/11/2022)  Housing: Low Risk  (11/11/2022)  Transportation Needs: No Transportation Needs (11/11/2022)  Utilities: Not At Risk (11/11/2022)  Alcohol Screen: Low Risk  (10/24/2022)  Depression (PHQ2-9): Low Risk  (10/24/2022)  Financial Resource Strain: Low Risk  (10/24/2022)  Physical Activity: Sufficiently Active (10/24/2022)  Recent Concern: Physical Activity - Insufficiently Active (10/24/2022)  Social Connections: Unknown (10/24/2022)  Stress: No Stress Concern Present (10/24/2022)  Tobacco Use: Low Risk  (11/18/2022)     Distress Screen completed: No     No data to display            Family/Social Information:  Housing Arrangement: patient lives with his wife, Logan Ward. Family members/support persons in your life? 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  Patient filing for FMLA.  Encouraged him  to speak with his employer about short term disability. Income source: Supported by [facility] and Friends Financial concerns: Yes, due to illness and/or loss of work during treatment Type of concernCommunity education officer, Medical bills, and Food Food access concerns: yes Religious or spiritual practice: Patient stated he belongs to a church. Services Currently in place:  [employer].  Coping/ Adjustment to diagnosis: Patient understands treatment plan and what happens next? yes Concerns about diagnosis and/or treatment: Losing my job and/or losing income Patient reported stressors: Veterinary surgeon and/or priorities: "To get through this." Patient enjoys time with family/ friends Current coping skills/ strengths: Capable of independent living , Armed forces logistics/support/administrative officer , Solicitor fund of knowledge , Motivation for treatment/growth , Religious Affiliation , and Supportive family/friends     SUMMARY: Current SDOH Barriers:  Financial constraints related to medical bills.  Clinical Social Work Clinical Goal(s):  Explore community resource options for unmet needs related to:  Financial Strain   Interventions: Discussed common feeling and emotions when being diagnosed with cancer, and the importance of support during treatment Informed patient of the support team roles and support services at [facility] Provided [place] contact information and encouraged patient to call with any questions or concerns Referred patient to [employer] for the [employer].  Sent Disability referral to Cecille Rubin at the [facility].  Gave patient a bag of food and one [employer].  Applied for the Leukemia [employer] Lymph[employer] Patient Aid Program.   Follow Up Plan: Patient will contact CSW with any support or resource needs Patient verbalizes understanding of plan: Yes    Rodman Pickle Yehudis Monceaux, LCSW

## 2022-12-04 ENCOUNTER — Telehealth: Payer: Self-pay | Admitting: Hematology

## 2022-12-04 ENCOUNTER — Other Ambulatory Visit: Payer: Self-pay

## 2022-12-04 ENCOUNTER — Other Ambulatory Visit (HOSPITAL_COMMUNITY): Payer: Self-pay

## 2022-12-04 NOTE — Telephone Encounter (Signed)
Called patient to schedule NUT 45. Patient notified of new upcoming appointment.

## 2022-12-05 ENCOUNTER — Other Ambulatory Visit: Payer: Self-pay

## 2022-12-06 ENCOUNTER — Inpatient Hospital Stay: Payer: 59

## 2022-12-06 ENCOUNTER — Other Ambulatory Visit: Payer: Self-pay

## 2022-12-06 DIAGNOSIS — D696 Thrombocytopenia, unspecified: Secondary | ICD-10-CM

## 2022-12-06 DIAGNOSIS — C8378 Burkitt lymphoma, lymph nodes of multiple sites: Secondary | ICD-10-CM

## 2022-12-06 DIAGNOSIS — Z5112 Encounter for antineoplastic immunotherapy: Secondary | ICD-10-CM | POA: Diagnosis not present

## 2022-12-06 LAB — CBC WITH DIFFERENTIAL (CANCER CENTER ONLY)
Abs Immature Granulocytes: 1.11 10*3/uL — ABNORMAL HIGH (ref 0.00–0.07)
Basophils Absolute: 0.1 10*3/uL (ref 0.0–0.1)
Basophils Relative: 1 %
Eosinophils Absolute: 0 10*3/uL (ref 0.0–0.5)
Eosinophils Relative: 0 %
HCT: 36.2 % — ABNORMAL LOW (ref 39.0–52.0)
Hemoglobin: 11.7 g/dL — ABNORMAL LOW (ref 13.0–17.0)
Immature Granulocytes: 17 %
Lymphocytes Relative: 13 %
Lymphs Abs: 0.8 10*3/uL (ref 0.7–4.0)
MCH: 27.3 pg (ref 26.0–34.0)
MCHC: 32.3 g/dL (ref 30.0–36.0)
MCV: 84.6 fL (ref 80.0–100.0)
Monocytes Absolute: 0.6 10*3/uL (ref 0.1–1.0)
Monocytes Relative: 10 %
Neutro Abs: 3.9 10*3/uL (ref 1.7–7.7)
Neutrophils Relative %: 59 %
Platelet Count: 114 10*3/uL — ABNORMAL LOW (ref 150–400)
RBC: 4.28 MIL/uL (ref 4.22–5.81)
RDW: 17.6 % — ABNORMAL HIGH (ref 11.5–15.5)
Smear Review: NORMAL
WBC Count: 6.6 10*3/uL (ref 4.0–10.5)
nRBC: 3 % — ABNORMAL HIGH (ref 0.0–0.2)

## 2022-12-10 ENCOUNTER — Encounter (HOSPITAL_COMMUNITY): Payer: Self-pay | Admitting: Hematology

## 2022-12-10 ENCOUNTER — Inpatient Hospital Stay (HOSPITAL_COMMUNITY)
Admission: RE | Admit: 2022-12-10 | Discharge: 2022-12-14 | DRG: 975 | Disposition: A | Discharge status: Home / Self Care | Payer: 59 | Source: Ambulatory Visit | Attending: Hematology | Admitting: Hematology

## 2022-12-10 ENCOUNTER — Other Ambulatory Visit: Payer: Self-pay

## 2022-12-10 ENCOUNTER — Ambulatory Visit: Payer: 59 | Admitting: Hematology

## 2022-12-10 DIAGNOSIS — K648 Other hemorrhoids: Secondary | ICD-10-CM | POA: Diagnosis present

## 2022-12-10 DIAGNOSIS — C8370 Burkitt lymphoma, unspecified site: Secondary | ICD-10-CM | POA: Diagnosis present

## 2022-12-10 DIAGNOSIS — Z808 Family history of malignant neoplasm of other organs or systems: Secondary | ICD-10-CM | POA: Diagnosis not present

## 2022-12-10 DIAGNOSIS — E669 Obesity, unspecified: Secondary | ICD-10-CM | POA: Diagnosis present

## 2022-12-10 DIAGNOSIS — Z6834 Body mass index (BMI) 34.0-34.9, adult: Secondary | ICD-10-CM | POA: Diagnosis not present

## 2022-12-10 DIAGNOSIS — Z7189 Other specified counseling: Secondary | ICD-10-CM

## 2022-12-10 DIAGNOSIS — C8378 Burkitt lymphoma, lymph nodes of multiple sites: Secondary | ICD-10-CM | POA: Diagnosis not present

## 2022-12-10 DIAGNOSIS — K626 Ulcer of anus and rectum: Secondary | ICD-10-CM | POA: Diagnosis present

## 2022-12-10 DIAGNOSIS — B20 Human immunodeficiency virus [HIV] disease: Secondary | ICD-10-CM | POA: Diagnosis present

## 2022-12-10 DIAGNOSIS — Z8249 Family history of ischemic heart disease and other diseases of the circulatory system: Secondary | ICD-10-CM

## 2022-12-10 DIAGNOSIS — A539 Syphilis, unspecified: Secondary | ICD-10-CM | POA: Diagnosis present

## 2022-12-10 DIAGNOSIS — Z5111 Encounter for antineoplastic chemotherapy: Secondary | ICD-10-CM

## 2022-12-10 DIAGNOSIS — K5900 Constipation, unspecified: Secondary | ICD-10-CM | POA: Diagnosis present

## 2022-12-10 LAB — COMPREHENSIVE METABOLIC PANEL
ALT: 53 U/L — ABNORMAL HIGH (ref 0–44)
AST: 30 U/L (ref 15–41)
Albumin: 3.3 g/dL — ABNORMAL LOW (ref 3.5–5.0)
Alkaline Phosphatase: 54 U/L (ref 38–126)
Anion gap: 5 (ref 5–15)
BUN: 11 mg/dL (ref 6–20)
CO2: 26 mmol/L (ref 22–32)
Calcium: 8.5 mg/dL — ABNORMAL LOW (ref 8.9–10.3)
Chloride: 108 mmol/L (ref 98–111)
Creatinine, Ser: 0.88 mg/dL (ref 0.61–1.24)
GFR, Estimated: >60 mL/min (ref >60)
Glucose, Bld: 106 mg/dL — ABNORMAL HIGH (ref 70–99)
Potassium: 4.7 mmol/L (ref 3.5–5.1)
Sodium: 139 mmol/L (ref 135–145)
Total Bilirubin: 0.9 mg/dL (ref 0.3–1.2)
Total Protein: 5.9 g/dL — ABNORMAL LOW (ref 6.5–8.1)

## 2022-12-10 LAB — CBC WITH DIFFERENTIAL/PLATELET
Abs Immature Granulocytes: 0.13 10*3/uL — ABNORMAL HIGH (ref 0.00–0.07)
Basophils Absolute: 0 10*3/uL (ref 0.0–0.1)
Basophils Relative: 1 %
Eosinophils Absolute: 0 10*3/uL (ref 0.0–0.5)
Eosinophils Relative: 0 %
HCT: 33.7 % — ABNORMAL LOW (ref 39.0–52.0)
Hemoglobin: 10.4 g/dL — ABNORMAL LOW (ref 13.0–17.0)
Immature Granulocytes: 4 %
Lymphocytes Relative: 32 %
Lymphs Abs: 1.2 10*3/uL (ref 0.7–4.0)
MCH: 27.1 pg (ref 26.0–34.0)
MCHC: 30.9 g/dL (ref 30.0–36.0)
MCV: 87.8 fL (ref 80.0–100.0)
Monocytes Absolute: 0.8 10*3/uL (ref 0.1–1.0)
Monocytes Relative: 21 %
Neutro Abs: 1.5 10*3/uL — ABNORMAL LOW (ref 1.7–7.7)
Neutrophils Relative %: 42 %
Platelets: 182 10*3/uL (ref 150–400)
RBC: 3.84 MIL/uL — ABNORMAL LOW (ref 4.22–5.81)
RDW: 17.9 % — ABNORMAL HIGH (ref 11.5–15.5)
WBC: 3.6 10*3/uL — ABNORMAL LOW (ref 4.0–10.5)
nRBC: 1.1 % — ABNORMAL HIGH (ref 0.0–0.2)

## 2022-12-10 LAB — URIC ACID: Uric Acid, Serum: 8.5 mg/dL (ref 3.7–8.6)

## 2022-12-10 LAB — LACTATE DEHYDROGENASE: LDH: 322 U/L — ABNORMAL HIGH (ref 98–192)

## 2022-12-10 LAB — MAGNESIUM: Magnesium: 2.3 mg/dL (ref 1.7–2.4)

## 2022-12-10 MED ORDER — OXYCODONE HCL 5 MG PO TABS: 5.0000 mg | ORAL_TABLET | Freq: Four times a day (QID) | ORAL | Status: DC | PRN

## 2022-12-10 MED ORDER — SODIUM CHLORIDE 0.9 % IV SOLN
Freq: Once | INTRAVENOUS | Status: AC
  Administered 2022-12-10: 18 mg via INTRAVENOUS
  Filled 2022-12-10: qty 4

## 2022-12-10 MED ORDER — ALUM & MAG HYDROXIDE-SIMETH 200-200-20 MG/5ML PO SUSP
60.0000 mL | ORAL | Status: DC | PRN
  Administered 2022-12-14: 60 mL via ORAL
  Filled 2022-12-10 (×2): qty 60

## 2022-12-10 MED ORDER — LORAZEPAM 0.5 MG PO TABS: 0.5000 mg | ORAL_TABLET | Freq: Three times a day (TID) | ORAL | Status: DC | PRN

## 2022-12-10 MED ORDER — SENNOSIDES-DOCUSATE SODIUM 8.6-50 MG PO TABS
1.0000 | ORAL_TABLET | Freq: Two times a day (BID) | ORAL | Status: DC
  Administered 2022-12-10 – 2022-12-14 (×4): 1 via ORAL
  Filled 2022-12-10 (×8): qty 1

## 2022-12-10 MED ORDER — PROCHLORPERAZINE MALEATE 10 MG PO TABS: 10.0000 mg | ORAL_TABLET | Freq: Four times a day (QID) | ORAL | Status: DC | PRN

## 2022-12-10 MED ORDER — HYDROCORTISONE (PERIANAL) 2.5 % EX CREA: 1.0000 | TOPICAL_CREAM | Freq: Two times a day (BID) | CUTANEOUS | Status: DC | PRN

## 2022-12-10 MED ORDER — COLD PACK MISC ONCOLOGY: 1.0000 | Freq: Once | Status: DC | PRN

## 2022-12-10 MED ORDER — MIRTAZAPINE 15 MG PO TABS: 15.0000 mg | ORAL_TABLET | Freq: Every day | ORAL | Status: DC

## 2022-12-10 MED ORDER — POLYETHYLENE GLYCOL 3350 17 G PO PACK
17.0000 g | PACK | Freq: Every day | ORAL | Status: DC
  Filled 2022-12-10 (×4): qty 1

## 2022-12-10 MED ORDER — SODIUM CHLORIDE 0.9 % IV SOLN: INTRAVENOUS | Status: DC

## 2022-12-10 MED ORDER — ACETAMINOPHEN 325 MG PO TABS
650.0000 mg | ORAL_TABLET | ORAL | Status: DC | PRN
  Administered 2022-12-12: 650 mg via ORAL
  Filled 2022-12-10: qty 2

## 2022-12-10 MED ORDER — VINCRISTINE SULFATE CHEMO INJECTION 1 MG/ML
Freq: Once | INTRAVENOUS | Status: AC
  Filled 2022-12-10: qty 13

## 2022-12-10 MED ORDER — BICTEGRAVIR-EMTRICITAB-TENOFOV 50-200-25 MG PO TABS
1.0000 | ORAL_TABLET | Freq: Every day | ORAL | Status: DC
  Administered 2022-12-10 – 2022-12-14 (×5): 1 via ORAL
  Filled 2022-12-10 (×5): qty 1

## 2022-12-10 MED ORDER — HOT PACK MISC ONCOLOGY: 1.0000 | Freq: Once | Status: DC | PRN

## 2022-12-10 NOTE — Progress Notes (Signed)
Ok to proceed with chemo per Dr Irene Limbo

## 2022-12-11 ENCOUNTER — Telehealth: Payer: Self-pay

## 2022-12-11 ENCOUNTER — Encounter: Payer: Self-pay | Admitting: Hematology

## 2022-12-11 DIAGNOSIS — C8378 Burkitt lymphoma, lymph nodes of multiple sites: Secondary | ICD-10-CM | POA: Diagnosis not present

## 2022-12-11 DIAGNOSIS — Z5111 Encounter for antineoplastic chemotherapy: Secondary | ICD-10-CM | POA: Diagnosis not present

## 2022-12-11 LAB — COMPREHENSIVE METABOLIC PANEL
ALT: 45 U/L — ABNORMAL HIGH (ref 0–44)
AST: 29 U/L (ref 15–41)
Albumin: 3.2 g/dL — ABNORMAL LOW (ref 3.5–5.0)
Alkaline Phosphatase: 50 U/L (ref 38–126)
Anion gap: 6 (ref 5–15)
BUN: 8 mg/dL (ref 6–20)
CO2: 25 mmol/L (ref 22–32)
Calcium: 8.5 mg/dL — ABNORMAL LOW (ref 8.9–10.3)
Chloride: 107 mmol/L (ref 98–111)
Creatinine, Ser: 0.93 mg/dL (ref 0.61–1.24)
GFR, Estimated: >60 mL/min (ref >60)
Glucose, Bld: 132 mg/dL — ABNORMAL HIGH (ref 70–99)
Potassium: 4.3 mmol/L (ref 3.5–5.1)
Sodium: 138 mmol/L (ref 135–145)
Total Bilirubin: 1.1 mg/dL (ref 0.3–1.2)
Total Protein: 6.1 g/dL — ABNORMAL LOW (ref 6.5–8.1)

## 2022-12-11 LAB — CBC WITH DIFFERENTIAL/PLATELET
Abs Immature Granulocytes: 0.2 10*3/uL — ABNORMAL HIGH (ref 0.00–0.07)
Basophils Absolute: 0 10*3/uL (ref 0.0–0.1)
Basophils Relative: 0 %
Eosinophils Absolute: 0 10*3/uL (ref 0.0–0.5)
Eosinophils Relative: 0 %
HCT: 33.2 % — ABNORMAL LOW (ref 39.0–52.0)
Hemoglobin: 10.3 g/dL — ABNORMAL LOW (ref 13.0–17.0)
Immature Granulocytes: 3 %
Lymphocytes Relative: 16 %
Lymphs Abs: 1.3 10*3/uL (ref 0.7–4.0)
MCH: 27.2 pg (ref 26.0–34.0)
MCHC: 31 g/dL (ref 30.0–36.0)
MCV: 87.8 fL (ref 80.0–100.0)
Monocytes Absolute: 1 10*3/uL (ref 0.1–1.0)
Monocytes Relative: 12 %
Neutro Abs: 5.6 10*3/uL (ref 1.7–7.7)
Neutrophils Relative %: 69 %
Platelets: 216 10*3/uL (ref 150–400)
RBC: 3.78 MIL/uL — ABNORMAL LOW (ref 4.22–5.81)
RDW: 17.8 % — ABNORMAL HIGH (ref 11.5–15.5)
WBC: 8.1 10*3/uL (ref 4.0–10.5)
nRBC: 0.5 % — ABNORMAL HIGH (ref 0.0–0.2)

## 2022-12-11 LAB — URIC ACID: Uric Acid, Serum: 7.1 mg/dL (ref 3.7–8.6)

## 2022-12-11 MED ORDER — VINCRISTINE SULFATE CHEMO INJECTION 1 MG/ML
Freq: Once | INTRAVENOUS | Status: AC
  Filled 2022-12-11: qty 13

## 2022-12-11 MED ORDER — CHLORHEXIDINE GLUCONATE CLOTH 2 % EX PADS
6.0000 | MEDICATED_PAD | Freq: Every day | CUTANEOUS | Status: DC
  Administered 2022-12-11 – 2022-12-14 (×4): 6 via TOPICAL

## 2022-12-11 MED ORDER — SODIUM CHLORIDE 0.9 % IV SOLN
Freq: Once | INTRAVENOUS | Status: AC
  Administered 2022-12-11: 8 mg via INTRAVENOUS
  Filled 2022-12-11: qty 4

## 2022-12-11 NOTE — Telephone Encounter (Signed)
Confirmed apt time with Jonelle Sidle in XR for LP on 12/12/22 @ 9 AM. Spoke with Aldean Baker on 6E to request that as patient is being taken down for LP that someone call [phone], [place] charge nurse,so that RN can meet patient in radiology.

## 2022-12-11 NOTE — H&P (Signed)
[name]   HEMATOLOGY/ONCOLOGY H&P NOTE  Date of Service: 12/11/2022  Patient Care Team: Pcp, No as PCP - General  CHIEF COMPLAINTS/PURPOSE OF CONSULTATION:  Inpatient cycle 2 EPOCH-R for recently Burkitt's lymphoma  HISTORY OF PRESENTING ILLNESS:   Logan Ward is a wonderful 41 y.o. 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 He notes no acute new symptoms since his last clinic visit.  Notes some intermittent frontal tension headaches. Notes that his right neck mass has nearly completely resolved. No fevers no chills no night sweats. No other new focal neurologic symptoms. No new infection symptoms. Continues to follow with infectious disease for management of his HIV AIDS and has been compliant with his HAART treatment and OI prophylaxis. Has been eating well with good hydration. Notes no other residual significant toxicity from his cycle 1 of chemotherapy. Labs done today were reviewed in detail with the patient. Wife and mother at bedside during this discussion.   MEDICAL HISTORY:  Past Medical History:  Diagnosis Date   Anal fissure    HIV infection ([place])    Internal hemorrhoids    Obesity    Rectal ulcer     SURGICAL HISTORY: Past Surgical History:  Procedure Laterality Date   IR IMAGING GUIDED PORT INSERTION  11/18/2022    SOCIAL HISTORY: Social History   Socioeconomic History   Marital status: Single    Spouse name: Not on file   Number of children: Not on file   Years of education: Not on file   Highest education level: Not on file  Occupational History   Not on file  Tobacco Use   Smoking status: Never   Smokeless tobacco: Never  Vaping Use   Vaping Use: Never used  Substance and Sexual Activity   Alcohol use: No   Drug use: No   Sexual activity: Yes    Comment: given condoms  Other Topics  Concern   Not on file  Social History Narrative   Not on file   Social Determinants of Health   Financial Resource Strain: Low Risk  (10/24/2022)   Overall Financial Resource Strain (CARDIA)    Difficulty of Paying Living Expenses: Not hard at all  Food Insecurity: No Food Insecurity (12/10/2022)   Hunger Vital Sign    Worried About Running Out of Food in the Last Year: Never true    Ran Out of Food in the Last Year: Never true  Transportation Needs: No Transportation Needs (12/10/2022)   PRAPARE - Hydrologist (Medical): No    Lack of Transportation (Non-Medical): No  Physical Activity: Sufficiently Active (10/24/2022)   Exercise Vital Sign    Days of Exercise per Week: 3 days    Minutes of Exercise per Session: 60 min  Recent Concern: Physical Activity - Insufficiently Active (10/24/2022)   Exercise Vital Sign    Days of Exercise per Week: 3 days    Minutes of Exercise per Session: 20 min  Stress: No Stress Concern Present (10/24/2022)   [place]    Feeling of Stress : Not at all  Social Connections: Unknown (10/24/2022)   Social Connection and Isolation Panel [NHANES]    Frequency of Communication with Friends and Family: Twice a week    Frequency of Social Gatherings with Friends and Family: Twice a  week    Attends Religious Services: 1 to 4 times per year    Active Member of Clubs or Organizations: No    Attends Archivist Meetings: 1 to 4 times per year    Marital Status: Patient refused  Intimate Partner Violence: Not At Risk (12/10/2022)   Humiliation, Afraid, Rape, and Kick questionnaire    Fear of Current or Ex-Partner: No    Emotionally Abused: No    Physically Abused: No    Sexually Abused: No    FAMILY HISTORY: Family History  Problem Relation Age of Onset   Hypertension Father    Throat cancer Father    Heart attack Brother    Colon cancer Neg Hx     Esophageal cancer Neg Hx    Stomach cancer Neg Hx     ALLERGIES:  has No Known Allergies.  MEDICATIONS:  Current Facility-Administered Medications  Medication Dose Route Frequency Provider Last Rate Last Admin   0.9 %  sodium chloride infusion   Intravenous Continuous Brunetta Genera, MD 20 mL/hr at 12/11/22 0352 Infusion Verify at 12/11/22 0352   acetaminophen (TYLENOL) tablet 650 mg  650 mg Oral Q4H PRN Brunetta Genera, MD       alum & mag hydroxide-simeth (MAALOX/MYLANTA) 200-200-20 MG/5ML suspension 60 mL  60 mL Oral Q4H PRN Brunetta Genera, MD       bictegravir-emtricitabine-tenofovir AF (BIKTARVY) 50-200-25 MG per tablet 1 tablet  1 tablet Oral Daily Brunetta Genera, MD   1 tablet at 12/10/22 1342   Cold Pack 1 packet  1 packet Topical Once PRN Brunetta Genera, MD       DOXOrubicin (ADRIAMYCIN) 26 mg, etoposide (VEPESID) 128 mg, vinCRIStine (ONCOVIN) 1 mg in sodium chloride 0.9 % 1,000 mL chemo infusion   Intravenous Once Brunetta Genera, MD 51 mL/hr at 12/11/22 0352 Infusion Verify at 12/11/22 0352   DOXOrubicin (ADRIAMYCIN) 26 mg, etoposide (VEPESID) 128 mg, vinCRIStine (ONCOVIN) 1 mg in sodium chloride 0.9 % 1,000 mL chemo infusion   Intravenous Once Brunetta Genera, MD       Hot Pack 1 packet  1 packet Topical Once PRN Brunetta Genera, MD       hydrocortisone (ANUSOL-HC) 2.5 % rectal cream 1 Application  1 Application Rectal BID PRN Brunetta Genera, MD       LORazepam (ATIVAN) tablet 0.5 mg  0.5 mg Oral Q8H PRN Brunetta Genera, MD       ondansetron (ZOFRAN) 8 mg, dexamethasone (DECADRON) 10 mg in sodium chloride 0.9 % 50 mL IVPB   Intravenous Once Brunetta Genera, MD       oxyCODONE (Oxy IR/ROXICODONE) immediate release tablet 5 mg  5 mg Oral Q6H PRN Brunetta Genera, MD       polyethylene glycol (MIRALAX / GLYCOLAX) packet 17 g  17 g Oral Daily Brunetta Genera, MD       prochlorperazine (COMPAZINE) tablet 10 mg  10 mg  Oral Q6H PRN Brunetta Genera, MD       senna-docusate (Senokot-S) tablet 1 tablet  1 tablet Oral BID Brunetta Genera, MD   1 tablet at 12/10/22 2137    REVIEW OF SYSTEMS:    10 Point review of Systems was done is negative except as noted above.  PHYSICAL EXAMINATION: ECOG PERFORMANCE STATUS: 1 - Symptomatic but completely ambulatory  . Vitals:   12/10/22 1804 12/11/22 0507  BP: 127/74 (!) 99/44  Pulse: 93 82  Resp: 18 16  Temp: 97.7 F (36.5 C) 98.2 F (36.8 C)  SpO2: 100% 98%   Filed Weights   12/10/22 1805  Weight: 262 lb (118.8 kg)   .Body mass index is 34.57 kg/m.  GENERAL:alert, in no acute distress and comfortable SKIN: no acute rashes, no significant lesions EYES: conjunctiva are pink and non-injected, sclera anicteric OROPHARYNX: MMM, no exudates, no oropharyngeal erythema or ulceration NECK: supple, no JVD LYMPH:  no palpable lymphadenopathy in the cervical, axillary or inguinal regions.  Minimal residual skin thickening over the right upper anterior chest wall. LUNGS: clear to auscultation b/l with normal respiratory effort HEART: regular rate & rhythm ABDOMEN:  normoactive bowel sounds , non tender, not distended.  No palpable hepatosplenomegaly Extremity: no pedal edema PSYCH: alert & oriented x 3 with fluent speech NEURO: no focal motor/sensory deficits  LABORATORY DATA:  I have reviewed the data as listed  .    Latest Ref Rng & Units 12/10/2022   10:49 AM 12/06/2022    9:48 AM 12/02/2022    8:27 AM  CBC  WBC 4.0 - 10.5 K/uL 3.6  6.6  9.5   Hemoglobin 13.0 - 17.0 g/dL 10.4  11.7  11.9   Hematocrit 39.0 - 52.0 % 33.7  36.2  35.9   Platelets 150 - 400 K/uL 182  114  74     .    Latest Ref Rng & Units 12/10/2022   10:49 AM 12/02/2022    8:27 AM 11/25/2022    8:16 AM  CMP  Glucose 70 - 99 mg/dL 106  135  109   BUN 6 - 20 mg/dL _0 Creatinine 0.61 - 1.24 mg/dL 0.88  0.88  0.77   Sodium 135 - 145 mmol/L 139  136  134    Potassium 3.5 - 5.1 mmol/L 4.7  3.8  4.2   Chloride 98 - 111 mmol/L 108  102  99   CO2 22 - 32 mmol/L _1 Calcium 8.9 - 10.3 mg/dL 8.5  8.8  9.3   Total Protein 6.5 - 8.1 g/dL 5.9  5.8  6.6   Total Bilirubin 0.3 - 1.2 mg/dL 0.9  0.5  1.6   Alkaline Phos 38 - 126 U/L 54  64  41   AST 15 - 41 U/L _2 ALT 0 - 44 U/L 53  41  23    Component     Latest Ref Rng 12/10/2022  Magnesium     1.7 - 2.4 mg/dL 2.3   LDH     98 - 192 U/L 322 (H)   Uric Acid, Serum     3.7 - 8.6 mg/dL 8.5     Legend: (H) High  RADIOGRAPHIC STUDIES: I have personally reviewed the radiological images as listed and agreed with the findings in the report. DG Fluoro Guide Spinal/SI Jt Inj Left  Result Date: 11/20/2022 CLINICAL DATA:  Burkitt lymphoma, chemotherapy injection. EXAM: DIAGNOSTIC LUMBAR PUNCTURE UNDER FLUOROSCOPIC GUIDANCE COMPARISON:  None Available. FLUOROSCOPY: Radiation Exposure Index (as provided by the fluoroscopic device): 14.9 mGy Kerma PROCEDURE: Procedure was discussed with the patient including risks and benefits. Patient's questions were answered. Written informed consent for the procedure was obtained. Timeout protocol followed. L3-4 disc space was localized under C-arm fluoroscopy. Skin prepped and draped in usual sterile fashion. Skin and soft tissues anesthetized with  10 mL of 1% lidocaine. Under sterile technique, 20  gauge spinal needle advanced into spinal canal. Clear colorless CSF was encountered. 7 mL of CSF was obtained for requested analysis. Patient was then injected with 12 mg of methotrexate intrathecally. Procedure tolerated well by patient without immediate complication. IMPRESSION: Successful lumbar puncture with intrathecal methotrexate administration. Electronically Signed   By: Lorin Picket M.D.   On: 11/20/2022 14:08   DG FL GUIDED LUMBAR PUNCTURE  Result Date: 11/20/2022 Lorin Picket, MD     11/20/2022  2:08 PM CLINICAL DATA: [Burkitt lymphoma,  chemotherapy injection.] EXAM: DIAGNOSTIC LUMBAR PUNCTURE UNDER FLUOROSCOPIC GUIDANCE COMPARISON: [None Available.] FLUOROSCOPY: Radiation Exposure Index (as provided by the fluoroscopic device): [14.9] [mGy Kerma] PROCEDURE:   Procedure was discussed with the patient including risks and benefits.  Patient's questions were answered. Written informed consent for the procedure was obtained. Timeout protocol followed.  [L3-4] disc space was localized under C-arm fluoroscopy.  Skin prepped and draped in usual sterile fashion. Skin and soft tissues anesthetized with _0  10 mL of 1% lidocaine. Under sterile technique, [20 gauge] spinal needle advanced into spinal canal. Clear colorless CSF was encountered. 7 mL of CSF was obtained for requested analysis. Patient was then injected with 12 mg of methotrexate intrathecally. Procedure tolerated well by patient without immediate complication.   IMPRESSION: [Successful lumbar puncture with intrathecal methotrexate administration.] Lorin Picket, mD  MR BRAIN W WO CONTRAST  Result Date: 11/19/2022 CLINICAL DATA:  Lymphoma staging. EXAM: MRI HEAD WITHOUT AND WITH CONTRAST TECHNIQUE: Multiplanar, multiecho pulse sequences of the brain and surrounding structures were obtained without and with intravenous contrast. CONTRAST:  37m GADAVIST GADOBUTROL 1 MMOL/ML IV SOLN COMPARISON:  None Available. FINDINGS: Brain: No acute infarction, hemorrhage, hydrocephalus, extra-axial collection or mass lesion. Vascular: Normal flow voids. Skull and upper cervical spine: Normal marrow signal. Sinuses/Orbits: Negative. Other: None. IMPRESSION: No evidence of intracranial metastatic disease. Electronically Signed   By: HMarin RobertsM.D.   On: 11/19/2022 16:17   NM PET Image Initial (PI) Skull Base To Thigh (F-18 FDG)  Result Date: 11/19/2022 CLINICAL DATA:  Initial treatment strategy for Burkitt's lymphoma. Initiation of chemotherapy same day. EXAM: NUCLEAR MEDICINE PET SKULL BASE TO  THIGH TECHNIQUE: 14.3 mCi F-18 FDG was injected intravenously. Full-ring PET imaging was performed from the skull base to thigh after the radiotracer. CT data was obtained and used for attenuation correction and anatomic localization. Fasting blood glucose: 14.3 mg/dl COMPARISON:  None Available. FINDINGS: Mediastinal blood pool activity: SUV max 2.7 Liver activity: SUV max 3.8 NECK: A mat of intensely hypermetabolic RIGHT enlarged cervical lymph nodes involve the RIGHT level 2, level 3 and RIGHT supraclavicular nodes. Nodal mass beneath the sternocleidomastoid muscle measures 27 mm on image 40 with SUV max equal 20.23 RIGHT neck mass superficial to the sternocleidomastoid muscle extends to the skin surface above the RIGHT clavicle measuring 5.1 cm with SUV max equal 33 point 1 (image 44). Incidental CT findings: None. CHEST: Hypermetabolic RIGHT jugular nodal conglomerateextends into the upper thoracic inlet and RIGHT upper mediastinum with SUV max equal 17 on image 50. No lower paratracheal adenopathy.  No hilar adenopathy. There are hypermetabolic RIGHT axillary nodes which are relatively small but intensely hypermetabolic. For example 8 mm node on image 58/4 with SUV max equal 8.6. Incidental CT findings: No suspicious pulmonary nodules. Port in the anterior chest wall with tip in distal SVC. ABDOMEN/PELVIS: No abnormal hypermetabolic activity within the liver, pancreas, adrenal glands, or spleen. No hypermetabolic lymph nodes in the abdomen or pelvis. Spleen is normal volume and  normal metabolic activity. Activity along the anterior LEFT chest wall related to port ejection is incidental/artifactual. Incidental CT findings: None. SKELETON: No abnormal marrow activity suggest lymphoma involvement. Incidental CT findings: None. IMPRESSION: 1. Enlarged intense hypermetabolic RIGHT cervical nodal mass extending from the level 2 nodal station to the RIGHT supraclavicular nodal station. Findings consistent high-grade  lymphoma 2. Superficial RIGHT neck mass extends to the skin surface superficial to the RIGHT sternocleidomastoid muscle. Intense metabolic activity consistent with high-grade lymphoma. 3. Hypermetabolic small RIGHT axillary nodal disease. 4. No evidence of spleen involvement or solid organ involvement below the diaphragms. 5. No evidence of marrow involvement. Electronically Signed   By: Suzy Bouchard M.D.   On: 11/19/2022 16:02   IR IMAGING GUIDED PORT INSERTION  Result Date: 11/18/2022 INDICATION: 41 year old male with history of Burkitt lymphoma requiring central venous access for chemotherapy administration. EXAM: IMPLANTED PORT A CATH PLACEMENT WITH ULTRASOUND AND FLUOROSCOPIC GUIDANCE COMPARISON:  None Available. MEDICATIONS: None. ANESTHESIA/SEDATION: Moderate (conscious) sedation was employed during this procedure. A total of Versed 2 mg and Fentanyl 100 mcg was administered intravenously. Moderate Sedation Time: 22 minutes. The patient's level of consciousness and vital signs were monitored continuously by radiology nursing throughout the procedure under my direct supervision. CONTRAST:  None FLUOROSCOPY TIME:  Three mGy COMPLICATIONS: None immediate. PROCEDURE: The procedure, risks, benefits, and alternatives were explained to the patient. Questions regarding the procedure were encouraged and answered. The patient understands and consents to the procedure. The left neck and chest were prepped with chlorhexidine in a sterile fashion, and a sterile drape was applied covering the operative field. Maximum barrier sterile technique with sterile gowns and gloves were used for the procedure. A timeout was performed prior to the initiation of the procedure. Ultrasound was used to examine the jugular vein which was compressible and free of internal echoes. A skin marker was used to demarcate the planned venotomy and port pocket incision sites. Local anesthesia was provided to these sites and the subcutaneous  tunnel track with 1% lidocaine with 1:100,000 epinephrine. A small incision was created at the jugular access site and blunt dissection was performed of the subcutaneous tissues. Under ultrasound guidance, the jugular vein was accessed with a 21 ga micropuncture needle and an 0.018" wire was inserted to the superior vena cava. Real-time ultrasound guidance was utilized for vascular access including the acquisition of a permanent ultrasound image documenting patency of the accessed vessel. A 5 Fr micopuncture set was then used, through which a 0.035" Rosen wire was passed under fluoroscopic guidance into the inferior vena cava. An 8 Fr dilator was then placed over the wire. A subcutaneous port pocket was then created along the upper chest wall utilizing a combination of sharp and blunt dissection. The pocket was irrigated with sterile saline, packed with gauze, and observed for hemorrhage. A single lumen "ISP" sized power injectable port was chosen for placement. The 8 Fr catheter was tunneled from the port pocket site to the venotomy incision. The port was placed in the pocket. The external catheter was trimmed to appropriate length. The dilator was exchanged for an 8 Fr peel-away sheath under fluoroscopic guidance. The catheter was then placed through the sheath and the sheath was removed. Final catheter positioning was confirmed and documented with a fluoroscopic spot radiograph. The port was accessed with a Huber needle, aspirated, and flushed with heparinized saline. The deep dermal layer of the port pocket incision was closed with interrupted 3-0 Vicryl suture. The skin was opposed with a  running subcuticular 4-0 Monocryl suture. Dermabond was then placed over the port pocket and neck incisions. The patient tolerated the procedure well without immediate post procedural complication. FINDINGS: After catheter placement, the tip lies within the superior cavoatrial junction. The catheter aspirates and flushes  normally and is ready for immediate use. IMPRESSION: Successful placement of a power injectable Port-A-Cath via the left internal jugular vein. The catheter is ready for immediate use. Ruthann Cancer, MD [facility] Electronically Signed   By: Ruthann Cancer M.D.   On: 11/18/2022 12:01   CT BONE MARROW BIOPSY & ASPIRATION  Result Date: 11/18/2022 INDICATION: 40 year old male with history of Burkitt lymphoma. EXAM: CT-GUIDED BONE MARROW BIOPSY AND ASPIRATION MEDICATIONS: None ANESTHESIA/SEDATION: Fentanyl 100 mcg IV; Versed 2 mg IV Sedation Time: 16 minutes; The patient was continuously monitored during the procedure by the interventional radiology nurse under my direct supervision. COMPLICATIONS: None immediate. PROCEDURE: Informed consent was obtained from the patient following an explanation of the procedure, risks, benefits and alternatives. The patient understands, agrees and consents for the procedure. All questions were addressed. A time out was performed prior to the initiation of the procedure. The patient was positioned prone and non-contrast localization CT was performed of the pelvis to demonstrate the iliac marrow spaces. The operative site was prepped and draped in the usual sterile fashion. Under sterile conditions and local anesthesia, a 22 gauge spinal needle was utilized for procedural planning. Next, an 11 gauge coaxial bone biopsy needle was advanced into the right iliac marrow space. Needle position was confirmed with CT imaging. Initially, a bone marrow aspiration was performed. Next, a bone marrow biopsy was obtained with the 11 gauge outer bone marrow device. Samples were prepared with the cytotechnologist and deemed adequate. The needle was removed and superficial hemostasis was obtained with manual compression. A dressing was applied. The patient tolerated the procedure well without immediate post procedural complication.  IMPRESSION: Successful CT guided right iliac bone marrow aspiration and core biopsy. Ruthann Cancer, MD [facility] Electronically Signed   By: Ruthann Cancer M.D.   On: 11/18/2022 11:34   [place] EKG SITE RITE  Result Date: 11/15/2022 If Site Rite image not attached, placement could not be confirmed due to current cardiac rhythm.  ECHOCARDIOGRAM COMPLETE  Result Date: 11/14/2022    ECHOCARDIOGRAM REPORT   Patient Name:   CHRISS MANNAN Date of Exam: 11/14/2022 Medical Rec #:  [id number]      Height:       73.0 in Accession #:    [id number]     Weight:       281.5 lb Date of Birth:  July 26, 1981       BSA:          2.488 m Patient Age:    89 years       BP:           129/79 mmHg Patient Gender: M              HR:           75 bpm. Exam Location:  Inpatient Procedure: 2D Echo, Color Doppler, Cardiac Doppler and Intracardiac            Opacification Agent Indications:    Chemo Z09  History:        Patient has no prior history of Echocardiogram examinations.  Chest Mass; Signs/Symptoms:Shortness of Breath.  Sonographer:    Greer Pickerel Referring Phys: [id number] Brunetta Genera  Sonographer Comments: Image acquisition challenging due to patient body habitus and Image acquisition challenging due to respiratory motion. Global longitudinal strain was attempted. IMPRESSIONS  1. Left ventricular ejection fraction, by estimation, is 60 to 65%. The left ventricle has normal function. The left ventricle has no regional wall motion abnormalities. There is mild left ventricular hypertrophy. Left ventricular diastolic parameters were normal.  2. Right ventricular systolic function is normal. The right ventricular size is normal. Tricuspid regurgitation signal is inadequate for assessing PA pressure.  3. The mitral valve is normal in structure. No evidence of mitral valve regurgitation.  4. The aortic valve is tricuspid. Aortic valve regurgitation is not  visualized.  5. The inferior vena cava is normal in size with greater than 50% respiratory variability, suggesting right atrial pressure of 3 mmHg. Comparison(s): No prior Echocardiogram. FINDINGS  Left Ventricle: Left ventricular ejection fraction, by estimation, is 60 to 65%. The left ventricle has normal function. The left ventricle has no regional wall motion abnormalities. Definity contrast agent was given IV to delineate the left ventricular  endocardial borders. The left ventricular internal cavity size was normal in size. There is mild left ventricular hypertrophy. Left ventricular diastolic parameters were normal. Right Ventricle: The right ventricular size is normal. No increase in right ventricular wall thickness. Right ventricular systolic function is normal. Tricuspid regurgitation signal is inadequate for assessing PA pressure. Left Atrium: Left atrial size was normal in size. Right Atrium: Right atrial size was normal in size. Pericardium: There is no evidence of pericardial effusion. Mitral Valve: The mitral valve is normal in structure. No evidence of mitral valve regurgitation. Tricuspid Valve: The tricuspid valve is normal in structure. Tricuspid valve regurgitation is not demonstrated. Aortic Valve: The aortic valve is tricuspid. Aortic valve regurgitation is not visualized. Pulmonic Valve: Pulmonic valve regurgitation is not visualized. Aorta: The aortic root and ascending aorta are structurally normal, with no evidence of dilitation. Venous: The inferior vena cava is normal in size with greater than 50% respiratory variability, suggesting right atrial pressure of 3 mmHg. IAS/Shunts: No atrial level shunt detected by color flow Doppler.  LEFT VENTRICLE PLAX 2D LVIDd:         3.60 cm   Diastology LVIDs:         2.40 cm   LV e' medial:    6.85 cm/s LV PW:         1.40 cm   LV E/e' medial:  13.3 LV IVS:        0.80 cm   LV e' lateral:   12.70 cm/s LVOT diam:     2.10 cm   LV E/e' lateral: 7.1 LV  SV:         77 LV SV Index:   31 LVOT Area:     3.46 cm  RIGHT VENTRICLE RV S prime:     17.70 cm/s TAPSE (M-mode): 2.8 cm LEFT ATRIUM           Index        RIGHT ATRIUM           Index LA diam:      3.10 cm 1.25 cm/m   RA Area:     16.80 cm LA Vol (A2C): 46.4 ml 18.65 ml/m  RA Volume:   40.80 ml  16.40 ml/m LA Vol (A4C): 36.6 ml 14.71 ml/m  AORTIC VALVE LVOT Vmax:  134.00 cm/s LVOT Vmean:  81.400 cm/s LVOT VTI:    0.223 m  AORTA Ao Root diam: 3.80 cm Ao Asc diam:  3.20 cm MITRAL VALVE MV Area (PHT): 3.63 cm    SHUNTS MV Decel Time: 209 msec    Systemic VTI:  0.22 m MR Peak grad: 3.7 mmHg     Systemic Diam: 2.10 cm MR Vmax:      96.60 cm/s MV E velocity: 90.80 cm/s MV A velocity: 76.70 cm/s MV E/A ratio:  1.18 Landscape architect signed by Phineas Inches Signature Date/Time: 11/14/2022/4:58:35 PM    Final    [place] CORE BIOPSY (SOFT TISSUE)  Result Date: 11/12/2022 INDICATION: 41 year old gentleman with history of lymphoma presents to IR for biopsy of right neck mass EXAM: Ultrasound-guided biopsy of right neck mass MEDICATIONS: None. ANESTHESIA/SEDATION: Moderate (conscious) sedation was employed during this procedure. A total of Versed 2.5 mg and Fentanyl 75 mcg was administered intravenously. Moderate Sedation Time: 10 minutes. The patient's level of consciousness and vital signs were monitored continuously by radiology nursing throughout the procedure under my direct supervision. COMPLICATIONS: None immediate. PROCEDURE: Informed written consent was obtained from the patient after a thorough discussion of the procedural risks, benefits and alternatives. All questions were addressed. Maximal Sterile Barrier Technique was utilized including caps, mask, sterile gowns, sterile gloves, sterile drape, hand hygiene and skin antiseptic. A timeout was performed prior to the initiation of the procedure. Patient position supine on the ultrasound table. Right neck skin prepped and draped in usual sterile  fashion. Following local lidocaine administration, 5- 18 gauge cores were obtained from the right neck mass utilizing continuous ultrasound guidance. Two cores were sent for Gram stain and culture in sterile saline. Three cores were sent for histologic analysis in formalin. Needle removed and hemostasis achieved with 5 minutes of manual compression. Post procedure ultrasound images showed no evidence of significant hemorrhage. IMPRESSION: Ultrasound-guided biopsy of right neck mass. Electronically Signed   By: Miachel Roux M.D.   On: 11/12/2022 17:17    ASSESSMENT & PLAN:   41 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  No evidence of involvement below the diaphragm. No CNS involvement on lumbar puncture or by clinical symptoms. Status post cycle 1 of daEPOCH-R with no significant toxicities. Had a mild spinal headache after his previous intrathecal methotrexate prophylaxis.  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  Patient reports this was completely treated.   #4  Status post tumor lysis syndrome completed allopurinol for 1 month.  Patient had limited tumor burden most of which has resolved. Currently holding allopurinol from cycle 2 and monitoring closely   PLAN: -H&P was performed in detail.  Patient notes no acute new issues since his last clinic visit and feels ready for cycle 2 of inpatient [place] done today were reviewed in detail with the patient and are stable to proceed with cycle 2 of dose adjusted EPOCH with same dosing and cycle 1. -He will get outpatient Udenyca and Rituxan on 12/17/2022 -Patient notes no acute toxicities from cycle 1 of daEPOCH-R chemotherapy -Will hold allopurinol at this time due to low risk of continued tumor lysis  syndrome due to limited tumor burden and increased risk of developing more significant cytopenias. -Will hold  Bactrim at this time pending completion of his intrathecal methotrexate. -SCD for DVT prophylaxis.  Holding Lovenox at this time pending lumbar puncture.  Patient was recommended to maintain good hydration and to have intermittent walks around his room. -Chemotherapy orders reviewed and signed -Daily CBC CMP and uric acid -Would plan for intrathecal methotrexate for CNS prophylaxis second dose with cycle 2 day 3 of treatment on 12/12/2022 -Oncology will continue to follow -Patient's and his family's (mother and wife at bedside) questions were answered in details. -Care should be taken not to share his HIV/AIDS diagnosis with his family as per patient's request.   The total time spent in the appointment was 80 minutes*.  All of the patient's questions were answered with apparent satisfaction. The patient knows to call the clinic with any problems, questions or concerns.   Sullivan Lone MD MS AAHIVMS [facility] Hematology/Oncology Physician [facility]  .*Total Encounter Time as defined by the Centers for Medicare and Medicaid Services includes, in addition to the face-to-face time of a patient visit (documented in the note above) non-face-to-face time: obtaining and reviewing outside history, ordering and reviewing medications, tests or procedures, care coordination (communications with other health care professionals or caregivers) and documentation in the medical record.

## 2022-12-11 NOTE — Progress Notes (Signed)
  Transition of Care [facility]) Screening Note   Patient Details  Name: Logan Ward Date of Birth: Oct 08, 1981   Transition of Care [facility]) CM/SW Contact:    Dessa Phi, RN Phone Number: 12/11/2022, 12:44 PM    Transition of Care Department [facility]) has reviewed patient and no TOC needs have been identified at this time. We will continue to monitor patient advancement through interdisciplinary progression rounds. If new patient transition needs arise, please place a TOC consult.

## 2022-12-12 ENCOUNTER — Encounter: Payer: Self-pay | Admitting: Hematology

## 2022-12-12 ENCOUNTER — Inpatient Hospital Stay (HOSPITAL_COMMUNITY): Payer: 59

## 2022-12-12 DIAGNOSIS — Z5111 Encounter for antineoplastic chemotherapy: Secondary | ICD-10-CM

## 2022-12-12 DIAGNOSIS — C8378 Burkitt lymphoma, lymph nodes of multiple sites: Secondary | ICD-10-CM | POA: Diagnosis not present

## 2022-12-12 LAB — CSF CELL COUNT WITH DIFFERENTIAL
RBC Count, CSF: 695 /mm3 — ABNORMAL HIGH
Tube #: 1
WBC, CSF: 4 /mm3 (ref 0–5)

## 2022-12-12 LAB — COMPREHENSIVE METABOLIC PANEL
ALT: 42 U/L (ref 0–44)
AST: 30 U/L (ref 15–41)
Albumin: 3.3 g/dL — ABNORMAL LOW (ref 3.5–5.0)
Alkaline Phosphatase: 50 U/L (ref 38–126)
Anion gap: 7 (ref 5–15)
BUN: 11 mg/dL (ref 6–20)
CO2: 24 mmol/L (ref 22–32)
Calcium: 8.9 mg/dL (ref 8.9–10.3)
Chloride: 106 mmol/L (ref 98–111)
Creatinine, Ser: 1 mg/dL (ref 0.61–1.24)
GFR, Estimated: >60 mL/min (ref >60)
Glucose, Bld: 130 mg/dL — ABNORMAL HIGH (ref 70–99)
Potassium: 4.6 mmol/L (ref 3.5–5.1)
Sodium: 137 mmol/L (ref 135–145)
Total Bilirubin: 1.1 mg/dL (ref 0.3–1.2)
Total Protein: 6.1 g/dL — ABNORMAL LOW (ref 6.5–8.1)

## 2022-12-12 LAB — CBC WITH DIFFERENTIAL/PLATELET
Abs Immature Granulocytes: 0.11 10*3/uL — ABNORMAL HIGH (ref 0.00–0.07)
Basophils Absolute: 0 10*3/uL (ref 0.0–0.1)
Basophils Relative: 0 %
Eosinophils Absolute: 0 10*3/uL (ref 0.0–0.5)
Eosinophils Relative: 0 %
HCT: 33.5 % — ABNORMAL LOW (ref 39.0–52.0)
Hemoglobin: 10.5 g/dL — ABNORMAL LOW (ref 13.0–17.0)
Immature Granulocytes: 2 %
Lymphocytes Relative: 13 %
Lymphs Abs: 0.9 10*3/uL (ref 0.7–4.0)
MCH: 27.3 pg (ref 26.0–34.0)
MCHC: 31.3 g/dL (ref 30.0–36.0)
MCV: 87 fL (ref 80.0–100.0)
Monocytes Absolute: 0.5 10*3/uL (ref 0.1–1.0)
Monocytes Relative: 8 %
Neutro Abs: 4.9 10*3/uL (ref 1.7–7.7)
Neutrophils Relative %: 77 %
Platelets: 233 10*3/uL (ref 150–400)
RBC: 3.85 MIL/uL — ABNORMAL LOW (ref 4.22–5.81)
RDW: 17.9 % — ABNORMAL HIGH (ref 11.5–15.5)
WBC: 6.4 10*3/uL (ref 4.0–10.5)
nRBC: 0.5 % — ABNORMAL HIGH (ref 0.0–0.2)

## 2022-12-12 LAB — PROTEIN, CSF: Total  Protein, CSF: 26 mg/dL (ref 15–45)

## 2022-12-12 LAB — GLUCOSE, CSF: Glucose, CSF: 76 mg/dL — ABNORMAL HIGH (ref 40–70)

## 2022-12-12 LAB — URIC ACID: Uric Acid, Serum: 7.2 mg/dL (ref 3.7–8.6)

## 2022-12-12 LAB — PHOSPHOLIPIDS, SERUM: Phospholipids: 180 mg/dL (ref 127–261)

## 2022-12-12 MED ORDER — SODIUM CHLORIDE 0.9 % IV SOLN
Freq: Once | INTRAVENOUS | Status: AC
  Administered 2022-12-12: 8 mg via INTRAVENOUS
  Filled 2022-12-12: qty 4

## 2022-12-12 MED ORDER — SODIUM CHLORIDE (PF) 0.9 % IJ SOLN
Freq: Once | INTRAMUSCULAR | Status: AC
  Filled 2022-12-12: qty 0.48

## 2022-12-12 MED ORDER — LIDOCAINE HCL (PF) 1 % IJ SOLN
INTRAMUSCULAR | Status: AC
  Filled 2022-12-12: qty 5

## 2022-12-12 MED ORDER — VINCRISTINE SULFATE CHEMO INJECTION 1 MG/ML
Freq: Once | INTRAVENOUS | Status: AC
  Filled 2022-12-12: qty 13

## 2022-12-12 NOTE — Progress Notes (Signed)
[name]  HEMATOLOGY/ONCOLOGY INPATIENT PROGRESS NOTE  Date of Service: 12/11/2022  Inpatient Attending: .Brunetta Genera, MD   SUBJECTIVE  Patient was seen in follow-up and is tolerating his cycle 2-day 2 of EPOCH chemotherapy without any acute issues.  Notes no headaches today. Grade 1 nausea resolved with antinausea medications. Good p.o. and fluid intake. Labs discussed in detail. Patient is agreeable with her spinal tap and intrathecal methotrexate scheduled for tomorrow. No other acute new toxicities.  OBJECTIVE:  NAD  PHYSICAL EXAMINATION: . Vitals:   12/10/22 1805 12/11/22 0507 12/11/22 1954 12/12/22 0446  BP:  (!) 99/44 120/63 112/65  Pulse:  82 80 (!) 57  Resp:  _0 Temp:  98.2 F (36.8 C) 98.1 F (36.7 C) 97.9 F (36.6 C)  TempSrc:  Oral Oral Oral  SpO2:  98% 99% 99%  Weight: 262 lb (118.8 kg)     Height: _1  (1.854 m)      Filed Weights   12/10/22 1805  Weight: 262 lb (118.8 kg)   .Body mass index is 34.57 kg/m. [name] GENERAL:alert, in no acute distress and comfortable LUNGS: clear to auscultation b/l with normal respiratory effort HEART: regular rate & rhythm ABDOMEN:  normoactive bowel sounds , non tender, not distended. Extremity: no pedal edema PSYCH: alert & oriented x 3 with fluent speech NEURO: no focal motor/sensory deficits   MEDICAL HISTORY:  Past Medical History:  Diagnosis Date   Anal fissure    HIV infection ([place])    Internal hemorrhoids    Obesity    Rectal ulcer     SURGICAL HISTORY: Past Surgical History:  Procedure Laterality Date   IR IMAGING GUIDED PORT INSERTION  11/18/2022    SOCIAL HISTORY: Social History   Socioeconomic History   Marital status: Single    Spouse name: Not on file   Number of children: Not on file   Years of education: Not on file   Highest education level: Not on file  Occupational History   Not on file  Tobacco Use   Smoking status: Never   Smokeless tobacco: Never  Vaping Use    Vaping Use: Never used  Substance and Sexual Activity   Alcohol use: No   Drug use: No   Sexual activity: Yes    Comment: given condoms  Other Topics Concern   Not on file  Social History Narrative   Not on file   Social Determinants of Health   Financial Resource Strain: Low Risk  (10/24/2022)   Overall Financial Resource Strain (CARDIA)    Difficulty of Paying Living Expenses: Not hard at all  Food Insecurity: No Food Insecurity (12/10/2022)   Hunger Vital Sign    Worried About Running Out of Food in the Last Year: Never true    Ran Out of Food in the Last Year: Never true  Transportation Needs: No Transportation Needs (12/10/2022)   PRAPARE - Hydrologist (Medical): No    Lack of Transportation (Non-Medical): No  Physical Activity: Sufficiently Active (10/24/2022)   Exercise Vital Sign    Days of Exercise per Week: 3 days    Minutes of Exercise per Session: 60 min  Recent Concern: Physical Activity - Insufficiently Active (10/24/2022)   Exercise Vital Sign    Days of Exercise per Week: 3 days    Minutes of Exercise per Session: 20 min  Stress: No Stress Concern Present (10/24/2022)   [place]  Questionnaire    Feeling of Stress : Not at all  Social Connections: Unknown (10/24/2022)   Social Connection and Isolation Panel [NHANES]    Frequency of Communication with Friends and Family: Twice a week    Frequency of Social Gatherings with Friends and Family: Twice a week    Attends Religious Services: 1 to 4 times per year    Active Member of [employer] or Organizations: No    Attends Archivist Meetings: 1 to 4 times per year    Marital Status: Patient refused  Intimate Partner Violence: Not At Risk (12/10/2022)   Humiliation, Afraid, Rape, and Kick questionnaire    Fear of Current or Ex-Partner: No    Emotionally Abused: No    Physically Abused: No    Sexually Abused: No    FAMILY  HISTORY: Family History  Problem Relation Age of Onset   Hypertension Father    Throat cancer Father    Heart attack Brother    Colon cancer Neg Hx    Esophageal cancer Neg Hx    Stomach cancer Neg Hx     ALLERGIES:  has No Known Allergies.  MEDICATIONS:  Scheduled Meds:  bictegravir-emtricitabine-tenofovir AF  1 tablet Oral Daily   Chlorhexidine Gluconate Cloth  6 each Topical Daily   DOXOrubicin (ADRIAMYCIN) 26 mg, etoposide (VEPESID) 128 mg, vinCRIStine (ONCOVIN) 1 mg in sodium chloride 0.9 % 1,000 mL chemo infusion   Intravenous Once   DOXOrubicin (ADRIAMYCIN) 26 mg, etoposide (VEPESID) 128 mg, vinCRIStine (ONCOVIN) 1 mg in sodium chloride 0.9 % 1,000 mL chemo infusion   Intravenous Once   lidocaine (PF)       polyethylene glycol  17 g Oral Daily   senna-docusate  1 tablet Oral BID   Continuous Infusions:  sodium chloride 20 mL/hr at 12/12/22 0348   ondansetron (ZOFRAN) 8 mg, dexamethasone (DECADRON) 10 mg in sodium chloride 0.9 % 50 mL IVPB     PRN Meds:.acetaminophen, alum & mag hydroxide-simeth, Cold Pack, Hot Pack, hydrocortisone, lidocaine (PF), LORazepam, oxyCODONE, prochlorperazine  REVIEW OF SYSTEMS:    .10 Point review of Systems was done is negative except as noted above.  LABORATORY DATA:  I have reviewed the data as listed  .    Latest Ref Rng & Units 12/12/2022    5:19 AM 12/11/2022   12:10 PM 12/10/2022   10:49 AM  CBC  WBC 4.0 - 10.5 K/uL 6.4  8.1  3.6   Hemoglobin 13.0 - 17.0 g/dL 10.5  10.3  10.4   Hematocrit 39.0 - 52.0 % 33.5  33.2  33.7   Platelets 150 - 400 K/uL 233  216  182     .    Latest Ref Rng & Units 12/12/2022    5:19 AM 12/11/2022   12:10 PM 12/10/2022   10:49 AM  CMP  Glucose 70 - 99 mg/dL 130  132  106   BUN 6 - 20 mg/dL _0 Creatinine 0.61 - 1.24 mg/dL 1.00  0.93  0.88   Sodium 135 - 145 mmol/L 137  138  139   Potassium 3.5 - 5.1 mmol/L 4.6  4.3  4.7   Chloride 98 - 111 mmol/L 106  107  108   CO2 22 - 32 mmol/L  _1 Calcium 8.9 - 10.3 mg/dL 8.9  8.5  8.5   Total Protein 6.5 - 8.1 g/dL 6.1  6.1  5.9   Total Bilirubin  0.3 - 1.2 mg/dL 1.1  1.1  0.9   Alkaline Phos 38 - 126 U/L 50  50  54   AST 15 - 41 U/L _0 ALT 0 - 44 U/L 42  45  53      RADIOGRAPHIC STUDIES: I have personally reviewed the radiological images as listed and agreed with the findings in the report. DG FL GUIDED LUMBAR PUNCTURE  Result Date: 12/12/2022 CLINICAL DATA:  Burkitt's lymphoma.  HIV positive. EXAM: FLUOROSCOPICALLY GUIDED LUMBAR PUNCTURE FOR INTRATHECAL CHEMOTHERAPY FLUOROSCOPY: Radiation Exposure Index (as provided by the fluoroscopic device): 13.3 mGy Kerma PROCEDURE: Informed consent was obtained from the patient prior to the procedure, including potential complications of headache, allergy, and pain. With the patient prone, the lower back was prepped with Betadine. 1% Lidocaine was used for local anesthesia. Lumbar puncture was performed at the L3-4 level using a gauge needle with return of clear colorlessCSF. CSF sample delivered to the laboratory. Pharmacy prepared methotrexate was injected into the subarachnoid space. The patient tolerated the procedure well without apparent complication. IMPRESSION: Fluoroscopically guided lumbar puncture for intrathecal chemotherapy administration. Successful lumbar puncture and injection of intrathecal methotrexate. Electronically Signed   By: Franchot Gallo M.D.   On: 12/12/2022 11:03   DG FLUORO GUIDED LOC OF NEEDLE/CATH TIP FOR SPINAL INJECT LT  Result Date: 12/12/2022 CLINICAL DATA:  Burkitt's lymphoma.  HIV positive. EXAM: FLUOROSCOPICALLY GUIDED LUMBAR PUNCTURE FOR INTRATHECAL CHEMOTHERAPY FLUOROSCOPY: Radiation Exposure Index (as provided by the fluoroscopic device): 13.3 mGy Kerma PROCEDURE: Informed consent was obtained from the patient prior to the procedure, including potential complications of headache, allergy, and pain. With the patient prone, the lower back  was prepped with Betadine. 1% Lidocaine was used for local anesthesia. Lumbar puncture was performed at the L3-4 level using a gauge needle with return of clear colorlessCSF. CSF sample delivered to the laboratory. Pharmacy prepared methotrexate was injected into the subarachnoid space. The patient tolerated the procedure well without apparent complication. IMPRESSION: Fluoroscopically guided lumbar puncture for intrathecal chemotherapy administration. Successful lumbar puncture and injection of intrathecal methotrexate. Electronically Signed   By: Franchot Gallo M.D.   On: 12/12/2022 11:03   DG Fluoro Guide Spinal/SI Jt Inj Left  Result Date: 11/20/2022 CLINICAL DATA:  Burkitt lymphoma, chemotherapy injection. EXAM: DIAGNOSTIC LUMBAR PUNCTURE UNDER FLUOROSCOPIC GUIDANCE COMPARISON:  None Available. FLUOROSCOPY: Radiation Exposure Index (as provided by the fluoroscopic device): 14.9 mGy Kerma PROCEDURE: Procedure was discussed with the patient including risks and benefits. Patient's questions were answered. Written informed consent for the procedure was obtained. Timeout protocol followed. L3-4 disc space was localized under C-arm fluoroscopy. Skin prepped and draped in usual sterile fashion. Skin and soft tissues anesthetized with  10 mL of 1% lidocaine. Under sterile technique, 20 gauge spinal needle advanced into spinal canal. Clear colorless CSF was encountered. 7 mL of CSF was obtained for requested analysis. Patient was then injected with 12 mg of methotrexate intrathecally. Procedure tolerated well by patient without immediate complication. IMPRESSION: Successful lumbar puncture with intrathecal methotrexate administration. Electronically Signed   By: Lorin Picket M.D.   On: 11/20/2022 14:08   DG FL GUIDED LUMBAR PUNCTURE  Result Date: 11/20/2022 Lorin Picket, MD     11/20/2022  2:08 PM CLINICAL DATA: [Burkitt lymphoma, chemotherapy injection.] EXAM: DIAGNOSTIC LUMBAR PUNCTURE UNDER FLUOROSCOPIC  GUIDANCE COMPARISON: [None Available.] FLUOROSCOPY: Radiation Exposure Index (as provided by the fluoroscopic device): [14.9] [mGy Kerma] PROCEDURE:   Procedure was discussed with the patient including  risks and benefits.  Patient's questions were answered. Written informed consent for the procedure was obtained. Timeout protocol followed.  [L3-4] disc space was localized under C-arm fluoroscopy.  Skin prepped and draped in usual sterile fashion. Skin and soft tissues anesthetized with _0  10 mL of 1% lidocaine. Under sterile technique, [20 gauge] spinal needle advanced into spinal canal. Clear colorless CSF was encountered. 7 mL of CSF was obtained for requested analysis. Patient was then injected with 12 mg of methotrexate intrathecally. Procedure tolerated well by patient without immediate complication.   IMPRESSION: [Successful lumbar puncture with intrathecal methotrexate administration.] Lorin Picket, mD  MR BRAIN W WO CONTRAST  Result Date: 11/19/2022 CLINICAL DATA:  Lymphoma staging. EXAM: MRI HEAD WITHOUT AND WITH CONTRAST TECHNIQUE: Multiplanar, multiecho pulse sequences of the brain and surrounding structures were obtained without and with intravenous contrast. CONTRAST:  47m GADAVIST GADOBUTROL 1 MMOL/ML IV SOLN COMPARISON:  None Available. FINDINGS: Brain: No acute infarction, hemorrhage, hydrocephalus, extra-axial collection or mass lesion. Vascular: Normal flow voids. Skull and upper cervical spine: Normal marrow signal. Sinuses/Orbits: Negative. Other: None. IMPRESSION: No evidence of intracranial metastatic disease. Electronically Signed   By: HMarin RobertsM.D.   On: 11/19/2022 16:17   NM PET Image Initial (PI) Skull Base To Thigh (F-18 FDG)  Result Date: 11/19/2022 CLINICAL DATA:  Initial treatment strategy for Burkitt's lymphoma. Initiation of chemotherapy same day. EXAM: NUCLEAR MEDICINE PET SKULL BASE TO THIGH TECHNIQUE: 14.3 mCi F-18 FDG was injected intravenously. Full-ring PET  imaging was performed from the skull base to thigh after the radiotracer. CT data was obtained and used for attenuation correction and anatomic localization. Fasting blood glucose: 14.3 mg/dl COMPARISON:  None Available. FINDINGS: Mediastinal blood pool activity: SUV max 2.7 Liver activity: SUV max 3.8 NECK: A mat of intensely hypermetabolic RIGHT enlarged cervical lymph nodes involve the RIGHT level 2, level 3 and RIGHT supraclavicular nodes. Nodal mass beneath the sternocleidomastoid muscle measures 27 mm on image 40 with SUV max equal 20.23 RIGHT neck mass superficial to the sternocleidomastoid muscle extends to the skin surface above the RIGHT clavicle measuring 5.1 cm with SUV max equal 33 point 1 (image 44). Incidental CT findings: None. CHEST: Hypermetabolic RIGHT jugular nodal conglomerateextends into the upper thoracic inlet and RIGHT upper mediastinum with SUV max equal 17 on image 50. No lower paratracheal adenopathy.  No hilar adenopathy. There are hypermetabolic RIGHT axillary nodes which are relatively small but intensely hypermetabolic. For example 8 mm node on image 58/4 with SUV max equal 8.6. Incidental CT findings: No suspicious pulmonary nodules. Port in the anterior chest wall with tip in distal SVC. ABDOMEN/PELVIS: No abnormal hypermetabolic activity within the liver, pancreas, adrenal glands, or spleen. No hypermetabolic lymph nodes in the abdomen or pelvis. Spleen is normal volume and normal metabolic activity. Activity along the anterior LEFT chest wall related to port ejection is incidental/artifactual. Incidental CT findings: None. SKELETON: No abnormal marrow activity suggest lymphoma involvement. Incidental CT findings: None. IMPRESSION: 1. Enlarged intense hypermetabolic RIGHT cervical nodal mass extending from the level 2 nodal station to the RIGHT supraclavicular nodal station. Findings consistent high-grade lymphoma 2. Superficial RIGHT neck mass extends to the skin surface  superficial to the RIGHT sternocleidomastoid muscle. Intense metabolic activity consistent with high-grade lymphoma. 3. Hypermetabolic small RIGHT axillary nodal disease. 4. No evidence of spleen involvement or solid organ involvement below the diaphragms. 5. No evidence of marrow involvement. Electronically Signed   By: SSuzy BouchardM.D.   On: 11/19/2022 16:02  IR IMAGING GUIDED PORT INSERTION  Result Date: 11/18/2022 INDICATION: 41 year old male with history of Burkitt lymphoma requiring central venous access for chemotherapy administration. EXAM: IMPLANTED PORT A CATH PLACEMENT WITH ULTRASOUND AND FLUOROSCOPIC GUIDANCE COMPARISON:  None Available. MEDICATIONS: None. ANESTHESIA/SEDATION: Moderate (conscious) sedation was employed during this procedure. A total of Versed 2 mg and Fentanyl 100 mcg was administered intravenously. Moderate Sedation Time: 22 minutes. The patient's level of consciousness and vital signs were monitored continuously by radiology nursing throughout the procedure under my direct supervision. CONTRAST:  None FLUOROSCOPY TIME:  Three mGy COMPLICATIONS: None immediate. PROCEDURE: The procedure, risks, benefits, and alternatives were explained to the patient. Questions regarding the procedure were encouraged and answered. The patient understands and consents to the procedure. The left neck and chest were prepped with chlorhexidine in a sterile fashion, and a sterile drape was applied covering the operative field. Maximum barrier sterile technique with sterile gowns and gloves were used for the procedure. A timeout was performed prior to the initiation of the procedure. Ultrasound was used to examine the jugular vein which was compressible and free of internal echoes. A skin marker was used to demarcate the planned venotomy and port pocket incision sites. Local anesthesia was provided to these sites and the subcutaneous tunnel track with 1% lidocaine with 1:100,000 epinephrine. A small  incision was created at the jugular access site and blunt dissection was performed of the subcutaneous tissues. Under ultrasound guidance, the jugular vein was accessed with a 21 ga micropuncture needle and an 0.018" wire was inserted to the superior vena cava. Real-time ultrasound guidance was utilized for vascular access including the acquisition of a permanent ultrasound image documenting patency of the accessed vessel. A 5 Fr micopuncture set was then used, through which a 0.035" Rosen wire was passed under fluoroscopic guidance into the inferior vena cava. An 8 Fr dilator was then placed over the wire. A subcutaneous port pocket was then created along the upper chest wall utilizing a combination of sharp and blunt dissection. The pocket was irrigated with sterile saline, packed with gauze, and observed for hemorrhage. A single lumen "ISP" sized power injectable port was chosen for placement. The 8 Fr catheter was tunneled from the port pocket site to the venotomy incision. The port was placed in the pocket. The external catheter was trimmed to appropriate length. The dilator was exchanged for an 8 Fr peel-away sheath under fluoroscopic guidance. The catheter was then placed through the sheath and the sheath was removed. Final catheter positioning was confirmed and documented with a fluoroscopic spot radiograph. The port was accessed with a Huber needle, aspirated, and flushed with heparinized saline. The deep dermal layer of the port pocket incision was closed with interrupted 3-0 Vicryl suture. The skin was opposed with a running subcuticular 4-0 Monocryl suture. Dermabond was then placed over the port pocket and neck incisions. The patient tolerated the procedure well without immediate post procedural complication. FINDINGS: After catheter placement, the tip lies within the superior cavoatrial junction. The catheter aspirates and flushes normally and is ready for immediate use. IMPRESSION: Successful  placement of a power injectable Port-A-Cath via the left internal jugular vein. The catheter is ready for immediate use. Ruthann Cancer, MD [facility] Radiology Electronically Signed   By: Ruthann Cancer M.D.   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 EXAM: CT-GUIDED BONE MARROW BIOPSY AND ASPIRATION  MEDICATIONS: None ANESTHESIA/SEDATION: Fentanyl 100 mcg IV; Versed 2 mg IV Sedation Time: 16 minutes; The patient was continuously monitored during the procedure by the interventional radiology nurse under my direct supervision. COMPLICATIONS: None immediate. PROCEDURE: Informed consent was obtained from the patient following an explanation of the procedure, risks, benefits and alternatives. The patient understands, agrees and consents for the procedure. All questions were addressed. A time out was performed prior to the initiation of the procedure. The patient was positioned prone and non-contrast localization CT was performed of the pelvis to demonstrate the iliac marrow spaces. The operative site was prepped and draped in the usual sterile fashion. Under sterile conditions and local anesthesia, a 22 gauge spinal needle was utilized for procedural planning. Next, an 11 gauge coaxial bone biopsy needle was advanced into the right iliac marrow space. Needle position was confirmed with CT imaging. Initially, a bone marrow aspiration was performed. Next, a bone marrow biopsy was obtained with the 11 gauge outer bone marrow device. Samples were prepared with the cytotechnologist and deemed adequate. The needle was removed and superficial hemostasis was obtained with manual compression. A dressing was applied. The patient tolerated the procedure well without immediate post procedural complication. IMPRESSION: Successful CT guided right iliac bone marrow aspiration and  core biopsy. Ruthann Cancer, MD [facility] Radiology Electronically Signed   By: Ruthann Cancer M.D.   On: 11/18/2022 11:34   [place] EKG SITE RITE  Result Date: 11/15/2022 If Site Rite image not attached, placement could not be confirmed due to current cardiac rhythm.  ECHOCARDIOGRAM COMPLETE  Result Date: 11/14/2022    ECHOCARDIOGRAM REPORT   Patient Name:   KHALI ALBANESE Date of Exam: 11/14/2022 Medical Rec #:  [id number]      Height:       73.0 in Accession #:    [id number]     Weight:       281.5 lb Date of Birth:  Jun 02, 1981       BSA:          2.488 m Patient Age:    56 years       BP:           129/79 mmHg Patient Gender: M              HR:           75 bpm. Exam Location:  Inpatient Procedure: 2D Echo, Color Doppler, Cardiac Doppler and Intracardiac            Opacification Agent Indications:    Chemo Z09  History:        Patient has no prior history of Echocardiogram examinations.                 Chest Mass; Signs/Symptoms:Shortness of Breath.  Sonographer:    Greer Pickerel Referring Phys: [id number] Brunetta Genera  Sonographer Comments: Image acquisition challenging due to patient body habitus and Image acquisition challenging due to respiratory motion. Global longitudinal strain was attempted. IMPRESSIONS  1. Left ventricular ejection fraction, by estimation, is 60 to 65%. The left ventricle has normal function. The left ventricle has no regional wall motion abnormalities. There is mild left ventricular hypertrophy. Left ventricular diastolic parameters were normal.  2. Right ventricular systolic function is normal. The right ventricular size is normal. Tricuspid regurgitation signal is inadequate for assessing PA pressure.  3. The mitral valve is normal in structure. No evidence of mitral valve regurgitation.  4. The aortic  valve is tricuspid. Aortic valve regurgitation is not visualized.  5. The inferior vena cava is normal in size with greater than  50% respiratory variability, suggesting right atrial pressure of 3 mmHg. Comparison(s): No prior Echocardiogram. FINDINGS  Left Ventricle: Left ventricular ejection fraction, by estimation, is 60 to 65%. The left ventricle has normal function. The left ventricle has no regional wall motion abnormalities. Definity contrast agent was given IV to delineate the left ventricular  endocardial borders. The left ventricular internal cavity size was normal in size. There is mild left ventricular hypertrophy. Left ventricular diastolic parameters were normal. Right Ventricle: The right ventricular size is normal. No increase in right ventricular wall thickness. Right ventricular systolic function is normal. Tricuspid regurgitation signal is inadequate for assessing PA pressure. Left Atrium: Left atrial size was normal in size. Right Atrium: Right atrial size was normal in size. Pericardium: There is no evidence of pericardial effusion. Mitral Valve: The mitral valve is normal in structure. No evidence of mitral valve regurgitation. Tricuspid Valve: The tricuspid valve is normal in structure. Tricuspid valve regurgitation is not demonstrated. Aortic Valve: The aortic valve is tricuspid. Aortic valve regurgitation is not visualized. Pulmonic Valve: Pulmonic valve regurgitation is not visualized. Aorta: The aortic root and ascending aorta are structurally normal, with no evidence of dilitation. Venous: The inferior vena cava is normal in size with greater than 50% respiratory variability, suggesting right atrial pressure of 3 mmHg. IAS/Shunts: No atrial level shunt detected by color flow Doppler.  LEFT VENTRICLE PLAX 2D LVIDd:         3.60 cm   Diastology LVIDs:         2.40 cm   LV e' medial:    6.85 cm/s LV PW:         1.40 cm   LV E/e' medial:  13.3 LV IVS:        0.80 cm   LV e' lateral:   12.70 cm/s LVOT diam:     2.10 cm   LV E/e' lateral: 7.1 LV SV:         77 LV SV Index:   31 LVOT Area:     3.46 cm  RIGHT VENTRICLE RV  S prime:     17.70 cm/s TAPSE (M-mode): 2.8 cm LEFT ATRIUM           Index        RIGHT ATRIUM           Index LA diam:      3.10 cm 1.25 cm/m   RA Area:     16.80 cm LA Vol (A2C): 46.4 ml 18.65 ml/m  RA Volume:   40.80 ml  16.40 ml/m LA Vol (A4C): 36.6 ml 14.71 ml/m  AORTIC VALVE LVOT Vmax:   134.00 cm/s LVOT Vmean:  81.400 cm/s LVOT VTI:    0.223 m  AORTA Ao Root diam: 3.80 cm Ao Asc diam:  3.20 cm MITRAL VALVE MV Area (PHT): 3.63 cm    SHUNTS MV Decel Time: 209 msec    Systemic VTI:  0.22 m MR Peak grad: 3.7 mmHg     Systemic Diam: 2.10 cm MR Vmax:      96.60 cm/s MV E velocity: 90.80 cm/s MV A velocity: 76.70 cm/s MV E/A ratio:  1.18 Landscape architect signed by Phineas Inches Signature Date/Time: 11/14/2022/4:58:35 PM    Final     ASSESSMENT & PLAN:  41 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  No evidence of involvement below the diaphragm. No CNS involvement on lumbar puncture or by clinical symptoms. Status post cycle 1 of daEPOCH-R with no significant toxicities. Had a mild spinal headache after his previous intrathecal methotrexate prophylaxis.   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  Patient reports this was completely treated.   #4  Status post tumor lysis syndrome completed allopurinol for 1 month.  Patient had limited tumor burden most of which has resolved. Currently holding allopurinol from cycle 2 and monitoring closely PLAN Patient with no significant new toxicities with cycle 2-day 2 of [place] today Labs stable and were reviewed with him in detail.  Uric acid levels within normal limits. He has been scheduled for his intrathecal methotrexate for CNS prophylaxis second dose tomorrow 12/12/2022. Encouraged hydration. Encouraged in  room mobility and using his SCDs for VTE prophylaxis. Has been scheduled for outpatient cycle of Rituxan as well as Udenyca on 12/17/2022  The total time spent in the appointment was 25 minutes*.  All of the patient's questions were answered with apparent satisfaction. The patient knows to call the clinic with any problems, questions or concerns.   Sullivan Lone MD MS AAHIVMS [facility] Hematology/Oncology Physician [facility]  .*Total Encounter Time as defined by the Centers for Medicare and Medicaid Services includes, in addition to the face-to-face time of a patient visit (documented in the note above) non-face-to-face time: obtaining and reviewing outside history, ordering and reviewing medications, tests or procedures, care coordination (communications with other health care professionals or caregivers) and documentation in the medical record.

## 2022-12-13 ENCOUNTER — Other Ambulatory Visit: Payer: Self-pay

## 2022-12-13 ENCOUNTER — Encounter: Payer: Self-pay | Admitting: Hematology

## 2022-12-13 DIAGNOSIS — C8378 Burkitt lymphoma, lymph nodes of multiple sites: Secondary | ICD-10-CM | POA: Diagnosis not present

## 2022-12-13 DIAGNOSIS — Z5111 Encounter for antineoplastic chemotherapy: Secondary | ICD-10-CM | POA: Diagnosis not present

## 2022-12-13 LAB — CBC WITH DIFFERENTIAL/PLATELET
Abs Immature Granulocytes: 0.04 10*3/uL (ref 0.00–0.07)
Basophils Absolute: 0 10*3/uL (ref 0.0–0.1)
Basophils Relative: 0 %
Eosinophils Absolute: 0 10*3/uL (ref 0.0–0.5)
Eosinophils Relative: 0 %
HCT: 34.2 % — ABNORMAL LOW (ref 39.0–52.0)
Hemoglobin: 10.8 g/dL — ABNORMAL LOW (ref 13.0–17.0)
Immature Granulocytes: 1 %
Lymphocytes Relative: 17 %
Lymphs Abs: 0.8 10*3/uL (ref 0.7–4.0)
MCH: 27.1 pg (ref 26.0–34.0)
MCHC: 31.6 g/dL (ref 30.0–36.0)
MCV: 85.7 fL (ref 80.0–100.0)
Monocytes Absolute: 0.3 10*3/uL (ref 0.1–1.0)
Monocytes Relative: 7 %
Neutro Abs: 3.7 10*3/uL (ref 1.7–7.7)
Neutrophils Relative %: 75 %
Platelets: 239 10*3/uL (ref 150–400)
RBC: 3.99 MIL/uL — ABNORMAL LOW (ref 4.22–5.81)
RDW: 17.1 % — ABNORMAL HIGH (ref 11.5–15.5)
WBC: 4.8 10*3/uL (ref 4.0–10.5)
nRBC: 0 % (ref 0.0–0.2)

## 2022-12-13 LAB — COMPREHENSIVE METABOLIC PANEL
ALT: 35 U/L (ref 0–44)
AST: 24 U/L (ref 15–41)
Albumin: 3.1 g/dL — ABNORMAL LOW (ref 3.5–5.0)
Alkaline Phosphatase: 48 U/L (ref 38–126)
Anion gap: 4 — ABNORMAL LOW (ref 5–15)
BUN: 14 mg/dL (ref 6–20)
CO2: 26 mmol/L (ref 22–32)
Calcium: 9 mg/dL (ref 8.9–10.3)
Chloride: 104 mmol/L (ref 98–111)
Creatinine, Ser: 0.85 mg/dL (ref 0.61–1.24)
GFR, Estimated: >60 mL/min (ref >60)
Glucose, Bld: 119 mg/dL — ABNORMAL HIGH (ref 70–99)
Potassium: 4.1 mmol/L (ref 3.5–5.1)
Sodium: 134 mmol/L — ABNORMAL LOW (ref 135–145)
Total Bilirubin: 1.2 mg/dL (ref 0.3–1.2)
Total Protein: 6.5 g/dL (ref 6.5–8.1)

## 2022-12-13 LAB — URIC ACID: Uric Acid, Serum: 7.9 mg/dL (ref 3.7–8.6)

## 2022-12-13 LAB — CYTOLOGY - NON PAP

## 2022-12-13 MED ORDER — SODIUM CHLORIDE 0.9 % IV SOLN
Freq: Once | INTRAVENOUS | Status: AC
  Administered 2022-12-13: 8 mg via INTRAVENOUS
  Filled 2022-12-13: qty 4

## 2022-12-13 MED ORDER — SODIUM BICARBONATE/SODIUM CHLORIDE MOUTHWASH
Freq: Every day | OROMUCOSAL | Status: DC
  Filled 2022-12-13: qty 1000

## 2022-12-13 MED ORDER — SODIUM CHLORIDE 0.9 % IV SOLN
Freq: Once | INTRAVENOUS | Status: AC
  Administered 2022-12-14: 8 mg via INTRAVENOUS
  Filled 2022-12-13: qty 8

## 2022-12-13 MED ORDER — SULFAMETHOXAZOLE-TRIMETHOPRIM 800-160 MG PO TABS
1.0000 | ORAL_TABLET | Freq: Every day | ORAL | Status: DC
  Administered 2022-12-14: 1 via ORAL
  Filled 2022-12-13: qty 1

## 2022-12-13 MED ORDER — VINCRISTINE SULFATE CHEMO INJECTION 1 MG/ML
Freq: Once | INTRAVENOUS | Status: AC
  Filled 2022-12-13: qty 13

## 2022-12-13 MED ORDER — SODIUM CHLORIDE 0.9 % IV SOLN
750.0000 mg/m2 | Freq: Once | INTRAVENOUS | Status: AC
  Administered 2022-12-14: 1920 mg via INTRAVENOUS
  Filled 2022-12-13: qty 96

## 2022-12-13 NOTE — Progress Notes (Signed)
Called Logan Ward to introduce myself as his Arboriculturist and to discuss the [employer].  Logan Ward would like to apply and since he's not receiving income at the moment he will provide a letter of support on 12/17/22.  Once received I will approve him for the grant, give him an expense sheet and my card for any questions or concerns he may have in the future.

## 2022-12-13 NOTE — Progress Notes (Signed)
Logan Ward  HEMATOLOGY/ONCOLOGY INPATIENT PROGRESS NOTE  Date of Service: 12/12/2022  Inpatient Attending: .Brunetta Genera, MD   SUBJECTIVE  Patient was seen in follow-up and is tolerating his cycle 2-day 3 of EPOCH chemotherapy.  He has planned intrathecal methotrexate today and tolerated the lumbar puncture well with only transient back pain.  Not noting any nausea or headaches at this time.  Mother at bedside.  Labs today were discussed in detail.  No other acute new symptoms.  OBJECTIVE:  NAD  PHYSICAL EXAMINATION: . Vitals:   12/11/22 1954 12/12/22 0446 12/12/22 1324 12/12/22 2016  BP: 120/63 112/65 110/67 129/68  Pulse: 80 (!) 57 66 77  Resp: _0 Temp:  97.9 F (36.6 C) 98.3 F (36.8 C) 98.4 F (36.9 C)  TempSrc: Oral Oral Oral Oral  SpO2: 99% 99% 100% 96%  Weight:      Height:       Filed Weights   12/10/22 1805  Weight: 262 lb (118.8 kg)   .Body mass index is 34.57 kg/m. . . GENERAL:alert, in no acute distress and comfortable LUNGS: clear to auscultation b/l with normal respiratory effort HEART: regular rate & rhythm ABDOMEN:  normoactive bowel sounds , non tender, not distended. Extremity: no pedal edema PSYCH: alert & oriented x 3 with fluent speech NEURO: no focal motor/sensory deficits    MEDICAL HISTORY:  Past Medical History:  Diagnosis Date   Anal fissure    HIV infection ([place])    Internal hemorrhoids    Obesity    Rectal ulcer     SURGICAL HISTORY: Past Surgical History:  Procedure Laterality Date   IR IMAGING GUIDED PORT INSERTION  11/18/2022    SOCIAL HISTORY: Social History   Socioeconomic History   Marital status: Single    Spouse name: Not on file   Number of children: Not on file   Years of education: Not on file   Highest education level: Not on file  Occupational History   Not on file  Tobacco Use   Smoking status: Never   Smokeless tobacco: Never  Vaping Use   Vaping Use: Never used  Substance and Sexual  Activity   Alcohol use: No   Drug use: No   Sexual activity: Yes    Comment: given condoms  Other Topics Concern   Not on file  Social History Narrative   Not on file   Social Determinants of Health   Financial Resource Strain: Low Risk  (10/24/2022)   Overall Financial Resource Strain (CARDIA)    Difficulty of Paying Living Expenses: Not hard at all  Food Insecurity: No Food Insecurity (12/10/2022)   Hunger Vital Sign    Worried About Running Out of Food in the Last Year: Never true    Ran Out of Food in the Last Year: Never true  Transportation Needs: No Transportation Needs (12/10/2022)   PRAPARE - Hydrologist (Medical): No    Lack of Transportation (Non-Medical): No  Physical Activity: Sufficiently Active (10/24/2022)   Exercise Vital Sign    Days of Exercise per Week: 3 days    Minutes of Exercise per Session: 60 min  Recent Concern: Physical Activity - Insufficiently Active (10/24/2022)   Exercise Vital Sign    Days of Exercise per Week: 3 days    Minutes of Exercise per Session: 20 min  Stress: No Stress Concern Present (10/24/2022)   [place]  Feeling of Stress : Not at all  Social Connections: Unknown (10/24/2022)   Social Connection and Isolation Panel [NHANES]    Frequency of Communication with Friends and Family: Twice a week    Frequency of Social Gatherings with Friends and Family: Twice a week    Attends Religious Services: 1 to 4 times per year    Active Member of [employer] or Organizations: No    Attends Archivist Meetings: 1 to 4 times per year    Marital Status: Patient refused  Intimate Partner Violence: Not At Risk (12/10/2022)   Humiliation, Afraid, Rape, and Kick questionnaire    Fear of Current or Ex-Partner: No    Emotionally Abused: No    Physically Abused: No    Sexually Abused: No    FAMILY HISTORY: Family History  Problem Relation Age of  Onset   Hypertension Father    Throat cancer Father    Heart attack Brother    Colon cancer Neg Hx    Esophageal cancer Neg Hx    Stomach cancer Neg Hx     ALLERGIES:  has No Known Allergies.  MEDICATIONS:  Scheduled Meds:  bictegravir-emtricitabine-tenofovir AF  1 tablet Oral Daily   Chlorhexidine Gluconate Cloth  6 each Topical Daily   [START ON 12/14/2022] cyclophosphamide  750 mg/m2 (Treatment Plan Recorded) Intravenous Once   DOXOrubicin (ADRIAMYCIN) 26 mg, etoposide (VEPESID) 128 mg, vinCRIStine (ONCOVIN) 1 mg in sodium chloride 0.9 % 1,000 mL chemo infusion   Intravenous Once   DOXOrubicin (ADRIAMYCIN) 26 mg, etoposide (VEPESID) 128 mg, vinCRIStine (ONCOVIN) 1 mg in sodium chloride 0.9 % 1,000 mL chemo infusion   Intravenous Once   polyethylene glycol  17 g Oral Daily   senna-docusate  1 tablet Oral BID   [START ON 12/14/2022] sulfamethoxazole-trimethoprim  1 tablet Oral Daily   Continuous Infusions:  sodium chloride 20 mL/hr at 12/13/22 0300   [START ON 12/14/2022] ondansetron (ZOFRAN) 16 mg, dexamethasone (DECADRON) 20 mg in sodium chloride 0.9 % 50 mL IVPB     ondansetron (ZOFRAN) 8 mg, dexamethasone (DECADRON) 10 mg in sodium chloride 0.9 % 50 mL IVPB     PRN Meds:.acetaminophen, alum & mag hydroxide-simeth, Cold Pack, Hot Pack, hydrocortisone, LORazepam, oxyCODONE, prochlorperazine  REVIEW OF SYSTEMS:    .10 Point review of Systems was done is negative except as noted above.  LABORATORY DATA:  I have reviewed the data as listed  .   Logan Ward    Latest Ref Rng & Units 12/12/2022    5:19 AM 12/11/2022   12:10 PM  CMP  Glucose 70 - 99 mg/dL 130  132   BUN 6 - 20 mg/dL 11  8   Creatinine 0.61 - 1.24 mg/dL 1.00  0.93   Sodium 135 - 145 mmol/L 137  138   Potassium 3.5 - 5.1 mmol/L 4.6  4.3   Chloride 98 - 111 mmol/L 106  107   CO2 22 - 32 mmol/L 24  25   Calcium 8.9 - 10.3 mg/dL 8.9  8.5   Total Protein 6.5 - 8.1 g/dL 6.1  6.1   Total Bilirubin 0.3 - 1.2 mg/dL 1.1   1.1   Alkaline Phos 38 - 126 U/L 50  50   AST 15 - 41 U/L 30  29   ALT 0 - 44 U/L 42  45      RADIOGRAPHIC STUDIES: I have personally reviewed the radiological images as listed and agreed with the findings in the report. DG FL  GUIDED LUMBAR PUNCTURE  Result Date: 12/12/2022 CLINICAL DATA:  Burkitt's lymphoma.  HIV positive. EXAM: FLUOROSCOPICALLY GUIDED LUMBAR PUNCTURE FOR INTRATHECAL CHEMOTHERAPY FLUOROSCOPY: Radiation Exposure Index (as provided by the fluoroscopic device): 13.3 mGy Kerma PROCEDURE: Informed consent was obtained from the patient prior to the procedure, including potential complications of headache, allergy, and pain. With the patient prone, the lower back was prepped with Betadine. 1% Lidocaine was used for local anesthesia. Lumbar puncture was performed at the L3-4 level using a gauge needle with return of clear colorlessCSF. CSF sample delivered to the laboratory. Pharmacy prepared methotrexate was injected into the subarachnoid space. The patient tolerated the procedure well without apparent complication. IMPRESSION: Fluoroscopically guided lumbar puncture for intrathecal chemotherapy administration. Successful lumbar puncture and injection of intrathecal methotrexate. Electronically Signed   By: Franchot Gallo M.D.   On: 12/12/2022 11:03   DG FLUORO GUIDED LOC OF NEEDLE/CATH TIP FOR SPINAL INJECT LT  Result Date: 12/12/2022 CLINICAL DATA:  Burkitt's lymphoma.  HIV positive. EXAM: FLUOROSCOPICALLY GUIDED LUMBAR PUNCTURE FOR INTRATHECAL CHEMOTHERAPY FLUOROSCOPY: Radiation Exposure Index (as provided by the fluoroscopic device): 13.3 mGy Kerma PROCEDURE: Informed consent was obtained from the patient prior to the procedure, including potential complications of headache, allergy, and pain. With the patient prone, the lower back was prepped with Betadine. 1% Lidocaine was used for local anesthesia. Lumbar puncture was performed at the L3-4 level using a gauge needle with return of  clear colorlessCSF. CSF sample delivered to the laboratory. Pharmacy prepared methotrexate was injected into the subarachnoid space. The patient tolerated the procedure well without apparent complication. IMPRESSION: Fluoroscopically guided lumbar puncture for intrathecal chemotherapy administration. Successful lumbar puncture and injection of intrathecal methotrexate. Electronically Signed   By: Franchot Gallo M.D.   On: 12/12/2022 11:03   DG Fluoro Guide Spinal/SI Jt Inj Left  Result Date: 11/20/2022 CLINICAL DATA:  Burkitt lymphoma, chemotherapy injection. EXAM: DIAGNOSTIC LUMBAR PUNCTURE UNDER FLUOROSCOPIC GUIDANCE COMPARISON:  None Available. FLUOROSCOPY: Radiation Exposure Index (as provided by the fluoroscopic device): 14.9 mGy Kerma PROCEDURE: Procedure was discussed with the patient including risks and benefits. Patient's questions were answered. Written informed consent for the procedure was obtained. Timeout protocol followed. L3-4 disc space was localized under C-arm fluoroscopy. Skin prepped and draped in usual sterile fashion. Skin and soft tissues anesthetized with  10 mL of 1% lidocaine. Under sterile technique, 20 gauge spinal needle advanced into spinal canal. Clear colorless CSF was encountered. 7 mL of CSF was obtained for requested analysis. Patient was then injected with 12 mg of methotrexate intrathecally. Procedure tolerated well by patient without immediate complication. IMPRESSION: Successful lumbar puncture with intrathecal methotrexate administration. Electronically Signed   By: Lorin Picket M.D.   On: 11/20/2022 14:08   DG FL GUIDED LUMBAR PUNCTURE  Result Date: 11/20/2022 Lorin Picket, MD     11/20/2022  2:08 PM CLINICAL DATA: [Burkitt lymphoma, chemotherapy injection.] EXAM: DIAGNOSTIC LUMBAR PUNCTURE UNDER FLUOROSCOPIC GUIDANCE COMPARISON: [None Available.] FLUOROSCOPY: Radiation Exposure Index (as provided by the fluoroscopic device): [14.9] [mGy Kerma] PROCEDURE:    Procedure was discussed with the patient including risks and benefits.  Patient's questions were answered. Written informed consent for the procedure was obtained. Timeout protocol followed.  [L3-4] disc space was localized under C-arm fluoroscopy.  Skin prepped and draped in usual sterile fashion. Skin and soft tissues anesthetized with _0  10 mL of 1% lidocaine. Under sterile technique, [20 gauge] spinal needle advanced into spinal canal. Clear colorless CSF was encountered. 7 mL of CSF was obtained for  requested analysis. Patient was then injected with 12 mg of methotrexate intrathecally. Procedure tolerated well by patient without immediate complication.   IMPRESSION: [Successful lumbar puncture with intrathecal methotrexate administration.] Lorin Picket, mD  MR BRAIN W WO CONTRAST  Result Date: 11/19/2022 CLINICAL DATA:  Lymphoma staging. EXAM: MRI HEAD WITHOUT AND WITH CONTRAST TECHNIQUE: Multiplanar, multiecho pulse sequences of the brain and surrounding structures were obtained without and with intravenous contrast. CONTRAST:  39m GADAVIST GADOBUTROL 1 MMOL/ML IV SOLN COMPARISON:  None Available. FINDINGS: Brain: No acute infarction, hemorrhage, hydrocephalus, extra-axial collection or mass lesion. Vascular: Normal flow voids. Skull and upper cervical spine: Normal marrow signal. Sinuses/Orbits: Negative. Other: None. IMPRESSION: No evidence of intracranial metastatic disease. Electronically Signed   By: HMarin RobertsM.D.   On: 11/19/2022 16:17   NM PET Image Initial (PI) Skull Base To Thigh (F-18 FDG)  Result Date: 11/19/2022 CLINICAL DATA:  Initial treatment strategy for Burkitt's lymphoma. Initiation of chemotherapy same day. EXAM: NUCLEAR MEDICINE PET SKULL BASE TO THIGH TECHNIQUE: 14.3 mCi F-18 FDG was injected intravenously. Full-ring PET imaging was performed from the skull base to thigh after the radiotracer. CT data was obtained and used for attenuation correction and anatomic  localization. Fasting blood glucose: 14.3 mg/dl COMPARISON:  None Available. FINDINGS: Mediastinal blood pool activity: SUV max 2.7 Liver activity: SUV max 3.8 NECK: A mat of intensely hypermetabolic RIGHT enlarged cervical lymph nodes involve the RIGHT level 2, level 3 and RIGHT supraclavicular nodes. Nodal mass beneath the sternocleidomastoid muscle measures 27 mm on image 40 with SUV max equal 20.23 RIGHT neck mass superficial to the sternocleidomastoid muscle extends to the skin surface above the RIGHT clavicle measuring 5.1 cm with SUV max equal 33 point 1 (image 44). Incidental CT findings: None. CHEST: Hypermetabolic RIGHT jugular nodal conglomerateextends into the upper thoracic inlet and RIGHT upper mediastinum with SUV max equal 17 on image 50. No lower paratracheal adenopathy.  No hilar adenopathy. There are hypermetabolic RIGHT axillary nodes which are relatively small but intensely hypermetabolic. For example 8 mm node on image 58/4 with SUV max equal 8.6. Incidental CT findings: No suspicious pulmonary nodules. Port in the anterior chest wall with tip in distal SVC. ABDOMEN/PELVIS: No abnormal hypermetabolic activity within the liver, pancreas, adrenal glands, or spleen. No hypermetabolic lymph nodes in the abdomen or pelvis. Spleen is normal volume and normal metabolic activity. Activity along the anterior LEFT chest wall related to port ejection is incidental/artifactual. Incidental CT findings: None. SKELETON: No abnormal marrow activity suggest lymphoma involvement. Incidental CT findings: None. IMPRESSION: 1. Enlarged intense hypermetabolic RIGHT cervical nodal mass extending from the level 2 nodal station to the RIGHT supraclavicular nodal station. Findings consistent high-grade lymphoma 2. Superficial RIGHT neck mass extends to the skin surface superficial to the RIGHT sternocleidomastoid muscle. Intense metabolic activity consistent with high-grade lymphoma. 3. Hypermetabolic small RIGHT  axillary nodal disease. 4. No evidence of spleen involvement or solid organ involvement below the diaphragms. 5. No evidence of marrow involvement. Electronically Signed   By: SSuzy BouchardM.D.   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 EXAM: IMPLANTED PORT A CATH PLACEMENT WITH ULTRASOUND AND FLUOROSCOPIC GUIDANCE COMPARISON:  None Available. MEDICATIONS: None. ANESTHESIA/SEDATION: Moderate (conscious) sedation was employed during this procedure. A total of Versed 2 mg and Fentanyl 100 mcg was administered intravenously. Moderate Sedation Time: 22 minutes. The patient's level  of consciousness and vital signs were monitored continuously by radiology nursing throughout the procedure under my direct supervision. CONTRAST:  None FLUOROSCOPY TIME:  Three mGy COMPLICATIONS: None immediate. PROCEDURE: The procedure, risks, benefits, and alternatives were explained to the patient. Questions regarding the procedure were encouraged and answered. The patient understands and consents to the procedure. The left neck and chest were prepped with chlorhexidine in a sterile fashion, and a sterile drape was applied covering the operative field. Maximum barrier sterile technique with sterile gowns and gloves were used for the procedure. A timeout was performed prior to the initiation of the procedure. Ultrasound was used to examine the jugular vein which was compressible and free of internal echoes. A skin marker was used to demarcate the planned venotomy and port pocket incision sites. Local anesthesia was provided to these sites and the subcutaneous tunnel track with 1% lidocaine with 1:100,000 epinephrine. A small incision was created at the jugular access site and blunt dissection was performed of the subcutaneous tissues. Under ultrasound guidance, the  jugular vein was accessed with a 21 ga micropuncture needle and an 0.018" wire was inserted to the superior vena cava. Real-time ultrasound guidance was utilized for vascular access including the acquisition of a permanent ultrasound image documenting patency of the accessed vessel. A 5 Fr micopuncture set was then used, through which a 0.035" Rosen wire was passed under fluoroscopic guidance into the inferior vena cava. An 8 Fr dilator was then placed over the wire. A subcutaneous port pocket was then created along the upper chest wall utilizing a combination of sharp and blunt dissection. The pocket was irrigated with sterile saline, packed with gauze, and observed for hemorrhage. A single lumen "ISP" sized power injectable port was chosen for placement. The 8 Fr catheter was tunneled from the port pocket site to the venotomy incision. The port was placed in the pocket. The external catheter was trimmed to appropriate length. The dilator was exchanged for an 8 Fr peel-away sheath under fluoroscopic guidance. The catheter was then placed through the sheath and the sheath was removed. Final catheter positioning was confirmed and documented with a fluoroscopic spot radiograph. The port was accessed with a Huber needle, aspirated, and flushed with heparinized saline. The deep dermal layer of the port pocket incision was closed with interrupted 3-0 Vicryl suture. The skin was opposed with a running subcuticular 4-0 Monocryl suture. Dermabond was then placed over the port pocket and neck incisions. The patient tolerated the procedure well without immediate post procedural complication. FINDINGS: After catheter placement, the tip lies within the superior cavoatrial junction. The catheter aspirates and flushes normally and is ready for immediate use. IMPRESSION: Successful placement of a power injectable Port-A-Cath via the left internal jugular vein. The catheter is ready for immediate use. Ruthann Cancer, MD [facility] Radiology Electronically Signed   By: Ruthann Cancer M.D.   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 EXAM: CT-GUIDED BONE MARROW BIOPSY AND ASPIRATION MEDICATIONS: None ANESTHESIA/SEDATION: Fentanyl 100 mcg IV; Versed 2 mg IV Sedation Time: 16 minutes; The patient was continuously monitored during the procedure by the interventional radiology nurse under my direct supervision. COMPLICATIONS: None immediate. PROCEDURE: Informed consent was obtained from the patient following an explanation of the procedure, risks, benefits and alternatives. The patient understands, agrees and consents for the procedure. All questions were addressed. A time out was performed  prior to the initiation of the procedure. The patient was positioned prone and non-contrast localization CT was performed of the pelvis to demonstrate the iliac marrow spaces. The operative site was prepped and draped in the usual sterile fashion. Under sterile conditions and local anesthesia, a 22 gauge spinal needle was utilized for procedural planning. Next, an 11 gauge coaxial bone biopsy needle was advanced into the right iliac marrow space. Needle position was confirmed with CT imaging. Initially, a bone marrow aspiration was performed. Next, a bone marrow biopsy was obtained with the 11 gauge outer bone marrow device. Samples were prepared with the cytotechnologist and deemed adequate. The needle was removed and superficial hemostasis was obtained with manual compression. A dressing was applied. The patient tolerated the procedure well without immediate post procedural complication. IMPRESSION: Successful CT guided right iliac bone marrow aspiration and core biopsy. Ruthann Cancer, MD [facility] Radiology Electronically Signed   By: Ruthann Cancer M.D.   On:  11/18/2022 11:34   [place] EKG SITE RITE  Result Date: 11/15/2022 If Site Rite image not attached, placement could not be confirmed due to current cardiac rhythm.  ECHOCARDIOGRAM COMPLETE  Result Date: 11/14/2022    ECHOCARDIOGRAM REPORT   Patient Name:   NIAM NEPOMUCENO Date of Exam: 11/14/2022 Medical Rec #:  [id number]      Height:       73.0 in Accession #:    [id number]     Weight:       281.5 lb Date of Birth:  [birth year]       BSA:          2.488 m Patient Age:    70 years       BP:           129/79 mmHg Patient Gender: M              HR:           75 bpm. Exam Location:  Inpatient Procedure: 2D Echo, Color Doppler, Cardiac Doppler and Intracardiac            Opacification Agent Indications:    Chemo Z09  History:        Patient has no prior history of Echocardiogram examinations.                 Chest Mass; Signs/Symptoms:Shortness of Breath.  Sonographer:    Greer Pickerel Referring Phys: [id number] Brunetta Genera  Sonographer Comments: Image acquisition challenging due to patient body habitus and Image acquisition challenging due to respiratory motion. Global longitudinal strain was attempted. IMPRESSIONS  1. Left ventricular ejection fraction, by estimation, is 60 to 65%. The left ventricle has normal function. The left ventricle has no regional wall motion abnormalities. There is mild left ventricular hypertrophy. Left ventricular diastolic parameters were normal.  2. Right ventricular systolic function is normal. The right ventricular size is normal. Tricuspid regurgitation signal is inadequate for assessing PA pressure.  3. The mitral valve is normal in structure. No evidence of mitral valve regurgitation.  4. The aortic valve is tricuspid. Aortic valve regurgitation is not visualized.  5. The inferior vena cava is normal in size with greater than 50% respiratory variability, suggesting right atrial pressure of 3 mmHg. Comparison(s): No prior Echocardiogram. FINDINGS  Left Ventricle: Left ventricular  ejection fraction, by estimation, is 60 to 65%. The left ventricle has normal function. The left ventricle has no regional wall motion abnormalities. Definity contrast agent was given  IV to delineate the left ventricular  endocardial borders. The left ventricular internal cavity size was normal in size. There is mild left ventricular hypertrophy. Left ventricular diastolic parameters were normal. Right Ventricle: The right ventricular size is normal. No increase in right ventricular wall thickness. Right ventricular systolic function is normal. Tricuspid regurgitation signal is inadequate for assessing PA pressure. Left Atrium: Left atrial size was normal in size. Right Atrium: Right atrial size was normal in size. Pericardium: There is no evidence of pericardial effusion. Mitral Valve: The mitral valve is normal in structure. No evidence of mitral valve regurgitation. Tricuspid Valve: The tricuspid valve is normal in structure. Tricuspid valve regurgitation is not demonstrated. Aortic Valve: The aortic valve is tricuspid. Aortic valve regurgitation is not visualized. Pulmonic Valve: Pulmonic valve regurgitation is not visualized. Aorta: The aortic root and ascending aorta are structurally normal, with no evidence of dilitation. Venous: The inferior vena cava is normal in size with greater than 50% respiratory variability, suggesting right atrial pressure of 3 mmHg. IAS/Shunts: No atrial level shunt detected by color flow Doppler.  LEFT VENTRICLE PLAX 2D LVIDd:         3.60 cm   Diastology LVIDs:         2.40 cm   LV e' medial:    6.85 cm/s LV PW:         1.40 cm   LV E/e' medial:  13.3 LV IVS:        0.80 cm   LV e' lateral:   12.70 cm/s LVOT diam:     2.10 cm   LV E/e' lateral: 7.1 LV SV:         77 LV SV Index:   31 LVOT Area:     3.46 cm  RIGHT VENTRICLE RV S prime:     17.70 cm/s TAPSE (M-mode): 2.8 cm LEFT ATRIUM           Index        RIGHT ATRIUM           Index LA diam:      3.10 cm 1.25 cm/m   RA Area:      16.80 cm LA Vol (A2C): 46.4 ml 18.65 ml/m  RA Volume:   40.80 ml  16.40 ml/m LA Vol (A4C): 36.6 ml 14.71 ml/m  AORTIC VALVE LVOT Vmax:   134.00 cm/s LVOT Vmean:  81.400 cm/s LVOT VTI:    0.223 m  AORTA Ao Root diam: 3.80 cm Ao Asc diam:  3.20 cm MITRAL VALVE MV Area (PHT): 3.63 cm    SHUNTS MV Decel Time: 209 msec    Systemic VTI:  0.22 m MR Peak grad: 3.7 mmHg     Systemic Diam: 2.10 cm MR Vmax:      96.60 cm/s MV E velocity: 90.80 cm/s MV A velocity: 76.70 cm/s MV E/A ratio:  1.18 Landscape architect signed by Phineas Inches Signature Date/Time: 11/14/2022/4:58:35 PM    Final     ASSESSMENT & PLAN:  41 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  No evidence of involvement below the diaphragm. No CNS involvement on lumbar puncture or by clinical symptoms. Status post cycle 1 of daEPOCH-R with no significant toxicities. Had a mild spinal headache after his previous intrathecal methotrexate prophylaxis.   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  Patient reports this was completely treated.   #4  Status post tumor lysis syndrome completed allopurinol for 1 month.  Patient had limited tumor burden most of which has resolved. Currently holding allopurinol from cycle 2 and monitoring closely PLAN Patient with no significant new toxicities with cycle 2-day 3 of [place] today Lab stable and or reviewed with him in detail. Uric acid levels within normal limits. He tolerated his second dose of prophylactic intrathecal methotrexate well today without any significant spinal headaches nausea or other significant side effects . -Continuing EPOCH as planned -Will restart Bactrim from tomorrow for OI prophylaxis post intrathecal methotrexate -encouraged in room  mobility and using his SCDs for VTE prophylaxis. Has been scheduled for outpatient cycle of Rituxan as well as Udenyca on 12/17/2022  The total time spent in the appointment was 25 minutes*.  All of the patient's questions were answered with apparent satisfaction. The patient knows to call the clinic with any problems, questions or concerns.   Sullivan Lone MD MS AAHIVMS [facility] Hematology/Oncology Physician [facility]  .*Total Encounter Time as defined by the Centers for Medicare and Medicaid Services includes, in addition to the face-to-face time of a patient visit (documented in the note above) non-face-to-face time: obtaining and reviewing outside history, ordering and reviewing medications, tests or procedures, care coordination (communications with other health care professionals or caregivers) and documentation in the medical record.

## 2022-12-13 NOTE — Progress Notes (Signed)
Logan Ward  HEMATOLOGY/ONCOLOGY INPATIENT PROGRESS NOTE  Date of Service: 12/12/2022  Inpatient Attending: .Brunetta Genera, MD   SUBJECTIVE  Patient was seen in follow-up and is tolerating his cycle 2-day 4 of EPOCH chemotherapy.  He notes no acute new concerns overnight.  No headaches.  No back pain.  No uncontrolled nausea or vomiting. Labs done today were discussed in detail with discussed in detail with him.  OBJECTIVE:  NAD  PHYSICAL EXAMINATION: . Vitals:   12/11/22 1954 12/12/22 0446 12/12/22 1324 12/12/22 2016  BP: 120/63 112/65 110/67 129/68  Pulse: 80 (!) 57 66 77  Resp: _0 Temp:  97.9 F (36.6 C) 98.3 F (36.8 C) 98.4 F (36.9 C)  TempSrc: Oral Oral Oral Oral  SpO2: 99% 99% 100% 96%  Weight:      Height:       Filed Weights   12/10/22 1805  Weight: 262 lb (118.8 kg)   .Body mass index is 34.57 kg/m. [facility] GENERAL:alert, in no acute distress and comfortable SKIN: no acute rashes, no significant lesions EYES: conjunctiva are pink and non-injected, sclera anicteric OROPHARYNX: MMM, no exudates, no oropharyngeal erythema or ulceration NECK: supple, no JVD LYMPH:  no palpable lymphadenopathy in the cervical, axillary or inguinal regions LUNGS: clear to auscultation b/l with normal respiratory effort HEART: regular rate & rhythm ABDOMEN:  normoactive bowel sounds , non tender, not distended. Extremity: no pedal edema PSYCH: alert & oriented x 3 with fluent speech NEURO: no focal motor/sensory deficits     MEDICAL HISTORY:  Past Medical History:  Diagnosis Date   Anal fissure    HIV infection ([place])    Internal hemorrhoids    Obesity    Rectal ulcer     SURGICAL HISTORY: Past Surgical History:  Procedure Laterality Date   IR IMAGING GUIDED PORT INSERTION  11/18/2022    SOCIAL HISTORY: Social History   Socioeconomic History   Marital status: Single    Spouse name: Not on file   Number of children: Not on file   Years of education:  Not on file   Highest education level: Not on file  Occupational History   Not on file  Tobacco Use   Smoking status: Never   Smokeless tobacco: Never  Vaping Use   Vaping Use: Never used  Substance and Sexual Activity   Alcohol use: No   Drug use: No   Sexual activity: Yes    Comment: given condoms  Other Topics Concern   Not on file  Social History Narrative   Not on file   Social Determinants of Health   Financial Resource Strain: Low Risk  (10/24/2022)   Overall Financial Resource Strain (CARDIA)    Difficulty of Paying Living Expenses: Not hard at all  Food Insecurity: No Food Insecurity (12/10/2022)   Hunger Vital Sign    Worried About Running Out of Food in the Last Year: Never true    Ran Out of Food in the Last Year: Never true  Transportation Needs: No Transportation Needs (12/10/2022)   PRAPARE - Hydrologist (Medical): No    Lack of Transportation (Non-Medical): No  Physical Activity: Sufficiently Active (10/24/2022)   Exercise Vital Sign    Days of Exercise per Week: 3 days    Minutes of Exercise per Session: 60 min  Recent Concern: Physical Activity - Insufficiently Active (10/24/2022)   Exercise Vital Sign    Days of Exercise per Week: 3 days  Minutes of Exercise per Session: 20 min  Stress: No Stress Concern Present (10/24/2022)   [place]    Feeling of Stress : Not at all  Social Connections: Unknown (10/24/2022)   Social Connection and Isolation Panel [NHANES]    Frequency of Communication with Friends and Family: Twice a week    Frequency of Social Gatherings with Friends and Family: Twice a week    Attends Religious Services: 1 to 4 times per year    Active Member of [employer] or Organizations: No    Attends Music therapist: 1 to 4 times per year    Marital Status: Patient refused  Intimate Partner Violence: Not At Risk (12/10/2022)    Humiliation, Afraid, Rape, and Kick questionnaire    Fear of Current or Ex-Partner: No    Emotionally Abused: No    Physically Abused: No    Sexually Abused: No    FAMILY HISTORY: Family History  Problem Relation Age of Onset   Hypertension Father    Throat cancer Father    Heart attack Brother    Colon cancer Neg Hx    Esophageal cancer Neg Hx    Stomach cancer Neg Hx     ALLERGIES:  has No Known Allergies.  MEDICATIONS:  Scheduled Meds:  bictegravir-emtricitabine-tenofovir AF  1 tablet Oral Daily   Chlorhexidine Gluconate Cloth  6 each Topical Daily   [START ON 12/14/2022] cyclophosphamide  750 mg/m2 (Treatment Plan Recorded) Intravenous Once   DOXOrubicin (ADRIAMYCIN) 26 mg, etoposide (VEPESID) 128 mg, vinCRIStine (ONCOVIN) 1 mg in sodium chloride 0.9 % 1,000 mL chemo infusion   Intravenous Once   DOXOrubicin (ADRIAMYCIN) 26 mg, etoposide (VEPESID) 128 mg, vinCRIStine (ONCOVIN) 1 mg in sodium chloride 0.9 % 1,000 mL chemo infusion   Intravenous Once   polyethylene glycol  17 g Oral Daily   senna-docusate  1 tablet Oral BID   [START ON 12/14/2022] sulfamethoxazole-trimethoprim  1 tablet Oral Daily   Continuous Infusions:  sodium chloride 20 mL/hr at 12/13/22 0300   [START ON 12/14/2022] ondansetron (ZOFRAN) 16 mg, dexamethasone (DECADRON) 20 mg in sodium chloride 0.9 % 50 mL IVPB     ondansetron (ZOFRAN) 8 mg, dexamethasone (DECADRON) 10 mg in sodium chloride 0.9 % 50 mL IVPB     PRN Meds:.acetaminophen, alum & mag hydroxide-simeth, Cold Pack, Hot Pack, hydrocortisone, LORazepam, oxyCODONE, prochlorperazine  REVIEW OF SYSTEMS:    .10 Point review of Systems was done is negative except as noted above.  LABORATORY DATA:  I have reviewed the data as listed .    Latest Ref Rng & Units 12/13/2022    5:00 AM 12/12/2022    5:19 AM 12/11/2022   12:10 PM  CBC  WBC 4.0 - 10.5 K/uL 4.8  6.4  8.1   Hemoglobin 13.0 - 17.0 g/dL 10.8  10.5  10.3   Hematocrit 39.0 - 52.0 % 34.2   33.5  33.2   Platelets 150 - 400 K/uL 239  233  216       Latest Ref Rng & Units 12/13/2022    5:00 AM 12/12/2022    5:19 AM 12/11/2022   12:10 PM  CMP  Glucose 70 - 99 mg/dL 119  130  132   BUN 6 - 20 mg/dL _0 Creatinine 0.61 - 1.24 mg/dL 0.85  1.00  0.93   Sodium 135 - 145 mmol/L 134  137  138   Potassium 3.5 -  5.1 mmol/L 4.1  4.6  4.3   Chloride 98 - 111 mmol/L 104  106  107   CO2 22 - 32 mmol/L _0 Calcium 8.9 - 10.3 mg/dL 9.0  8.9  8.5   Total Protein 6.5 - 8.1 g/dL 6.5  6.1  6.1   Total Bilirubin 0.3 - 1.2 mg/dL 1.2  1.1  1.1   Alkaline Phos 38 - 126 U/L 48  50  50   AST 15 - 41 U/L _1 ALT 0 - 44 U/L 35  42  45       RADIOGRAPHIC STUDIES: I have personally reviewed the radiological images as listed and agreed with the findings in the report. DG FL GUIDED LUMBAR PUNCTURE  Result Date: 12/12/2022 CLINICAL DATA:  Burkitt's lymphoma.  HIV positive. EXAM: FLUOROSCOPICALLY GUIDED LUMBAR PUNCTURE FOR INTRATHECAL CHEMOTHERAPY FLUOROSCOPY: Radiation Exposure Index (as provided by the fluoroscopic device): 13.3 mGy Kerma PROCEDURE: Informed consent was obtained from the patient prior to the procedure, including potential complications of headache, allergy, and pain. With the patient prone, the lower back was prepped with Betadine. 1% Lidocaine was used for local anesthesia. Lumbar puncture was performed at the L3-4 level using a gauge needle with return of clear colorlessCSF. CSF sample delivered to the laboratory. Pharmacy prepared methotrexate was injected into the subarachnoid space. The patient tolerated the procedure well without apparent complication. IMPRESSION: Fluoroscopically guided lumbar puncture for intrathecal chemotherapy administration. Successful lumbar puncture and injection of intrathecal methotrexate. Electronically Signed   By: Franchot Gallo M.D.   On: 12/12/2022 11:03   DG FLUORO GUIDED LOC OF NEEDLE/CATH TIP FOR SPINAL INJECT  LT  Result Date: 12/12/2022 CLINICAL DATA:  Burkitt's lymphoma.  HIV positive. EXAM: FLUOROSCOPICALLY GUIDED LUMBAR PUNCTURE FOR INTRATHECAL CHEMOTHERAPY FLUOROSCOPY: Radiation Exposure Index (as provided by the fluoroscopic device): 13.3 mGy Kerma PROCEDURE: Informed consent was obtained from the patient prior to the procedure, including potential complications of headache, allergy, and pain. With the patient prone, the lower back was prepped with Betadine. 1% Lidocaine was used for local anesthesia. Lumbar puncture was performed at the L3-4 level using a gauge needle with return of clear colorlessCSF. CSF sample delivered to the laboratory. Pharmacy prepared methotrexate was injected into the subarachnoid space. The patient tolerated the procedure well without apparent complication. IMPRESSION: Fluoroscopically guided lumbar puncture for intrathecal chemotherapy administration. Successful lumbar puncture and injection of intrathecal methotrexate. Electronically Signed   By: Franchot Gallo M.D.   On: 12/12/2022 11:03   DG Fluoro Guide Spinal/SI Jt Inj Left  Result Date: 11/20/2022 CLINICAL DATA:  Burkitt lymphoma, chemotherapy injection. EXAM: DIAGNOSTIC LUMBAR PUNCTURE UNDER FLUOROSCOPIC GUIDANCE COMPARISON:  None Available. FLUOROSCOPY: Radiation Exposure Index (as provided by the fluoroscopic device): 14.9 mGy Kerma PROCEDURE: Procedure was discussed with the patient including risks and benefits. Patient's questions were answered. Written informed consent for the procedure was obtained. Timeout protocol followed. L3-4 disc space was localized under C-arm fluoroscopy. Skin prepped and draped in usual sterile fashion. Skin and soft tissues anesthetized with  10 mL of 1% lidocaine. Under sterile technique, 20 gauge spinal needle advanced into spinal canal. Clear colorless CSF was encountered. 7 mL of CSF was obtained for requested analysis. Patient was then injected with 12 mg of methotrexate intrathecally.  Procedure tolerated well by patient without immediate complication. IMPRESSION: Successful lumbar puncture with intrathecal methotrexate administration. Electronically Signed   By: Lorin Picket M.D.   On: 11/20/2022 14:08  DG FL GUIDED LUMBAR PUNCTURE  Result Date: 11/20/2022 Lorin Picket, MD     11/20/2022  2:08 PM CLINICAL DATA: [Burkitt lymphoma, chemotherapy injection.] EXAM: DIAGNOSTIC LUMBAR PUNCTURE UNDER FLUOROSCOPIC GUIDANCE COMPARISON: [None Available.] FLUOROSCOPY: Radiation Exposure Index (as provided by the fluoroscopic device): [14.9] [mGy Kerma] PROCEDURE:   Procedure was discussed with the patient including risks and benefits.  Patient's questions were answered. Written informed consent for the procedure was obtained. Timeout protocol followed.  [L3-4] disc space was localized under C-arm fluoroscopy.  Skin prepped and draped in usual sterile fashion. Skin and soft tissues anesthetized with _0  10 mL of 1% lidocaine. Under sterile technique, [20 gauge] spinal needle advanced into spinal canal. Clear colorless CSF was encountered. 7 mL of CSF was obtained for requested analysis. Patient was then injected with 12 mg of methotrexate intrathecally. Procedure tolerated well by patient without immediate complication.   IMPRESSION: [Successful lumbar puncture with intrathecal methotrexate administration.] Lorin Picket, mD  MR BRAIN W WO CONTRAST  Result Date: 11/19/2022 CLINICAL DATA:  Lymphoma staging. EXAM: MRI HEAD WITHOUT AND WITH CONTRAST TECHNIQUE: Multiplanar, multiecho pulse sequences of the brain and surrounding structures were obtained without and with intravenous contrast. CONTRAST:  44m GADAVIST GADOBUTROL 1 MMOL/ML IV SOLN COMPARISON:  None Available. FINDINGS: Brain: No acute infarction, hemorrhage, hydrocephalus, extra-axial collection or mass lesion. Vascular: Normal flow voids. Skull and upper cervical spine: Normal marrow signal. Sinuses/Orbits: Negative. Other: None.  IMPRESSION: No evidence of intracranial metastatic disease. Electronically Signed   By: HMarin RobertsM.D.   On: 11/19/2022 16:17   NM PET Image Initial (PI) Skull Base To Thigh (F-18 FDG)  Result Date: 11/19/2022 CLINICAL DATA:  Initial treatment strategy for Burkitt's lymphoma. Initiation of chemotherapy same day. EXAM: NUCLEAR MEDICINE PET SKULL BASE TO THIGH TECHNIQUE: 14.3 mCi F-18 FDG was injected intravenously. Full-ring PET imaging was performed from the skull base to thigh after the radiotracer. CT data was obtained and used for attenuation correction and anatomic localization. Fasting blood glucose: 14.3 mg/dl COMPARISON:  None Available. FINDINGS: Mediastinal blood pool activity: SUV max 2.7 Liver activity: SUV max 3.8 NECK: A mat of intensely hypermetabolic RIGHT enlarged cervical lymph nodes involve the RIGHT level 2, level 3 and RIGHT supraclavicular nodes. Nodal mass beneath the sternocleidomastoid muscle measures 27 mm on image 40 with SUV max equal 20.23 RIGHT neck mass superficial to the sternocleidomastoid muscle extends to the skin surface above the RIGHT clavicle measuring 5.1 cm with SUV max equal 33 point 1 (image 44). Incidental CT findings: None. CHEST: Hypermetabolic RIGHT jugular nodal conglomerateextends into the upper thoracic inlet and RIGHT upper mediastinum with SUV max equal 17 on image 50. No lower paratracheal adenopathy.  No hilar adenopathy. There are hypermetabolic RIGHT axillary nodes which are relatively small but intensely hypermetabolic. For example 8 mm node on image 58/4 with SUV max equal 8.6. Incidental CT findings: No suspicious pulmonary nodules. Port in the anterior chest wall with tip in distal SVC. ABDOMEN/PELVIS: No abnormal hypermetabolic activity within the liver, pancreas, adrenal glands, or spleen. No hypermetabolic lymph nodes in the abdomen or pelvis. Spleen is normal volume and normal metabolic activity. Activity along the anterior LEFT chest wall  related to port ejection is incidental/artifactual. Incidental CT findings: None. SKELETON: No abnormal marrow activity suggest lymphoma involvement. Incidental CT findings: None. IMPRESSION: 1. Enlarged intense hypermetabolic RIGHT cervical nodal mass extending from the level 2 nodal station to the RIGHT supraclavicular nodal station. Findings consistent high-grade lymphoma 2. Superficial RIGHT neck mass  extends to the skin surface superficial to the RIGHT sternocleidomastoid muscle. Intense metabolic activity consistent with high-grade lymphoma. 3. Hypermetabolic small RIGHT axillary nodal disease. 4. No evidence of spleen involvement or solid organ involvement below the diaphragms. 5. No evidence of marrow involvement. Electronically Signed   By: Suzy Bouchard M.D.   On: 11/19/2022 16:02   IR IMAGING GUIDED PORT INSERTION  Result Date: 11/18/2022 INDICATION: 41 year old male with history of Burkitt lymphoma requiring central venous access for chemotherapy administration. EXAM: IMPLANTED PORT A CATH PLACEMENT WITH ULTRASOUND AND FLUOROSCOPIC GUIDANCE COMPARISON:  None Available. MEDICATIONS: None. ANESTHESIA/SEDATION: Moderate (conscious) sedation was employed during this procedure. A total of Versed 2 mg and Fentanyl 100 mcg was administered intravenously. Moderate Sedation Time: 22 minutes. The patient's level of consciousness and vital signs were monitored continuously by radiology nursing throughout the procedure under my direct supervision. CONTRAST:  None FLUOROSCOPY TIME:  Three mGy COMPLICATIONS: None immediate. PROCEDURE: The procedure, risks, benefits, and alternatives were explained to the patient. Questions regarding the procedure were encouraged and answered. The patient understands and consents to the procedure. The left neck and chest were prepped with chlorhexidine in a sterile fashion, and a sterile drape was applied covering the operative field. Maximum barrier sterile technique with  sterile gowns and gloves were used for the procedure. A timeout was performed prior to the initiation of the procedure. Ultrasound was used to examine the jugular vein which was compressible and free of internal echoes. A skin marker was used to demarcate the planned venotomy and port pocket incision sites. Local anesthesia was provided to these sites and the subcutaneous tunnel track with 1% lidocaine with 1:100,000 epinephrine. A small incision was created at the jugular access site and blunt dissection was performed of the subcutaneous tissues. Under ultrasound guidance, the jugular vein was accessed with a 21 ga micropuncture needle and an 0.018" wire was inserted to the superior vena cava. Real-time ultrasound guidance was utilized for vascular access including the acquisition of a permanent ultrasound image documenting patency of the accessed vessel. A 5 Fr micopuncture set was then used, through which a 0.035" Rosen wire was passed under fluoroscopic guidance into the inferior vena cava. An 8 Fr dilator was then placed over the wire. A subcutaneous port pocket was then created along the upper chest wall utilizing a combination of sharp and blunt dissection. The pocket was irrigated with sterile saline, packed with gauze, and observed for hemorrhage. A single lumen "ISP" sized power injectable port was chosen for placement. The 8 Fr catheter was tunneled from the port pocket site to the venotomy incision. The port was placed in the pocket. The external catheter was trimmed to appropriate length. The dilator was exchanged for an 8 Fr peel-away sheath under fluoroscopic guidance. The catheter was then placed through the sheath and the sheath was removed. Final catheter positioning was confirmed and documented with a fluoroscopic spot radiograph. The port was accessed with a Huber needle, aspirated, and flushed with heparinized saline. The deep dermal layer of the port pocket incision was closed with interrupted  3-0 Vicryl suture. The skin was opposed with a running subcuticular 4-0 Monocryl suture. Dermabond was then placed over the port pocket and neck incisions. The patient tolerated the procedure well without immediate post procedural complication. FINDINGS: After catheter placement, the tip lies within the superior cavoatrial junction. The catheter aspirates and flushes normally and is ready for immediate use. IMPRESSION: Successful placement of a power injectable Port-A-Cath via the left internal  jugular vein. The catheter is ready for immediate use. Ruthann Cancer, MD [facility] Electronically Signed   By: Ruthann Cancer M.D.   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 EXAM: CT-GUIDED BONE MARROW BIOPSY AND ASPIRATION MEDICATIONS: None ANESTHESIA/SEDATION: Fentanyl 100 mcg IV; Versed 2 mg IV Sedation Time: 16 minutes; The patient was continuously monitored during the procedure by the interventional radiology nurse under my direct supervision. COMPLICATIONS: None immediate. PROCEDURE: Informed consent was obtained from the patient following an explanation of the procedure, risks, benefits and alternatives. The patient understands, agrees and consents for the procedure. All questions were addressed. A time out was performed prior to the initiation of the procedure. The patient was positioned prone and non-contrast localization CT was performed of the pelvis to demonstrate the iliac marrow spaces. The operative site was prepped and draped in the usual sterile fashion. Under sterile conditions and local anesthesia, a 22 gauge spinal needle was utilized for procedural planning. Next, an 11 gauge coaxial bone biopsy needle was advanced into the right iliac marrow space. Needle position was confirmed with CT imaging. Initially, a bone marrow aspiration was  performed. Next, a bone marrow biopsy was obtained with the 11 gauge outer bone marrow device. Samples were prepared with the cytotechnologist and deemed adequate. The needle was removed and superficial hemostasis was obtained with manual compression. A dressing was applied. The patient tolerated the procedure well without immediate post procedural complication. IMPRESSION: Successful CT guided right iliac bone marrow aspiration and core biopsy. Ruthann Cancer, MD [facility] Electronically Signed   By: Ruthann Cancer M.D.   On: 11/18/2022 11:34   [place] EKG SITE RITE  Result Date: 11/15/2022 If Site Rite image not attached, placement could not be confirmed due to current cardiac rhythm.  ECHOCARDIOGRAM COMPLETE  Result Date: 11/14/2022    ECHOCARDIOGRAM REPORT   Patient Name:   Logan Ward Date of Exam: 11/14/2022 Medical Rec #:  [id number]      Height:       73.0 in Accession #:    [id number]     Weight:       281.5 lb Date of Birth:  02-Mar-1981       BSA:          2.488 m Patient Age:    41 years       BP:           129/79 mmHg Patient Gender: M              HR:           75 bpm. Exam Location:  Inpatient Procedure: 2D Echo, Color Doppler, Cardiac Doppler and Intracardiac            Opacification Agent Indications:    Chemo Z09  History:        Patient has no prior history of Echocardiogram examinations.                 Chest Mass; Signs/Symptoms:Shortness of Breath.  Sonographer:    Greer Pickerel Referring Phys: [id number] Brunetta Genera  Sonographer Comments: Image acquisition challenging due to patient body habitus and Image acquisition challenging due to respiratory motion. Global longitudinal strain was attempted. IMPRESSIONS  1. Left ventricular ejection fraction, by estimation, is 60 to 65%. The left ventricle has normal function. The left ventricle  has no regional wall motion abnormalities. There is mild left ventricular hypertrophy. Left  ventricular diastolic parameters were normal.  2. Right ventricular systolic function is normal. The right ventricular size is normal. Tricuspid regurgitation signal is inadequate for assessing PA pressure.  3. The mitral valve is normal in structure. No evidence of mitral valve regurgitation.  4. The aortic valve is tricuspid. Aortic valve regurgitation is not visualized.  5. The inferior vena cava is normal in size with greater than 50% respiratory variability, suggesting right atrial pressure of 3 mmHg. Comparison(s): No prior Echocardiogram. FINDINGS  Left Ventricle: Left ventricular ejection fraction, by estimation, is 60 to 65%. The left ventricle has normal function. The left ventricle has no regional wall motion abnormalities. Definity contrast agent was given IV to delineate the left ventricular  endocardial borders. The left ventricular internal cavity size was normal in size. There is mild left ventricular hypertrophy. Left ventricular diastolic parameters were normal. Right Ventricle: The right ventricular size is normal. No increase in right ventricular wall thickness. Right ventricular systolic function is normal. Tricuspid regurgitation signal is inadequate for assessing PA pressure. Left Atrium: Left atrial size was normal in size. Right Atrium: Right atrial size was normal in size. Pericardium: There is no evidence of pericardial effusion. Mitral Valve: The mitral valve is normal in structure. No evidence of mitral valve regurgitation. Tricuspid Valve: The tricuspid valve is normal in structure. Tricuspid valve regurgitation is not demonstrated. Aortic Valve: The aortic valve is tricuspid. Aortic valve regurgitation is not visualized. Pulmonic Valve: Pulmonic valve regurgitation is not visualized. Aorta: The aortic root and ascending aorta are structurally normal, with no evidence of dilitation. Venous: The inferior vena cava is normal in size with greater than 50% respiratory variability,  suggesting right atrial pressure of 3 mmHg. IAS/Shunts: No atrial level shunt detected by color flow Doppler.  LEFT VENTRICLE PLAX 2D LVIDd:         3.60 cm   Diastology LVIDs:         2.40 cm   LV e' medial:    6.85 cm/s LV PW:         1.40 cm   LV E/e' medial:  13.3 LV IVS:        0.80 cm   LV e' lateral:   12.70 cm/s LVOT diam:     2.10 cm   LV E/e' lateral: 7.1 LV SV:         77 LV SV Index:   31 LVOT Area:     3.46 cm  RIGHT VENTRICLE RV S prime:     17.70 cm/s TAPSE (M-mode): 2.8 cm LEFT ATRIUM           Index        RIGHT ATRIUM           Index LA diam:      3.10 cm 1.25 cm/m   RA Area:     16.80 cm LA Vol (A2C): 46.4 ml 18.65 ml/m  RA Volume:   40.80 ml  16.40 ml/m LA Vol (A4C): 36.6 ml 14.71 ml/m  AORTIC VALVE LVOT Vmax:   134.00 cm/s LVOT Vmean:  81.400 cm/s LVOT VTI:    0.223 m  AORTA Ao Root diam: 3.80 cm Ao Asc diam:  3.20 cm MITRAL VALVE MV Area (PHT): 3.63 cm    SHUNTS MV Decel Time: 209 msec    Systemic VTI:  0.22 m MR Peak grad: 3.7 mmHg     Systemic Diam: 2.10 cm  MR Vmax:      96.60 cm/s MV E velocity: 90.80 cm/s MV A velocity: 76.70 cm/s MV E/A ratio:  1.18 Landscape architect signed by Phineas Inches Signature Date/Time: 11/14/2022/4:58:35 PM    Final     ASSESSMENT & PLAN:  41 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  No evidence of involvement below the diaphragm. No CNS involvement on lumbar puncture or by clinical symptoms. Status post cycle 1 of daEPOCH-R with no significant toxicities. Had a mild spinal headache after his previous intrathecal methotrexate prophylaxis.   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  Patient reports this was completely treated.   #4  Status post tumor lysis syndrome completed allopurinol for 1 month.   Patient had limited tumor burden most of which has resolved. Now off allopurinol with minimal residual tumor burden and to avoid additional cytopenias. PLAN Patient with no significant new toxicities with cycle 2-day 4 of [place] today Labs done today are stable and uric acid remains within normal limits. Reasonable to restart his Bactrim for prophylaxis Continue current antinausea medications and other supportive medications -Patient appropriate to continue his [facility] treatment as scheduled -encouraged in room mobility and using his SCDs for VTE prophylaxis.  Patient prefers to hold off on his Lovenox shots. Has been scheduled for outpatient cycle of Rituxan as well as Udenyca on 12/17/2022 We will set him up for outpatient port flush labs and MD visit around 12/24/2022 for toxicity check and to monitor counts to determine possibility of dose escalation of his [facility] chemotherapy with cycle 3. Patient will likely discharge tomorrow after completion of treatment unless any new concerns arise.  The total time spent in the appointment was 25 minutes*.  All of the patient's questions were answered with apparent satisfaction. The patient knows to call the clinic with any problems, questions or concerns.   Sullivan Lone MD MS AAHIVMS [facility] Hematology/Oncology Physician [facility]  .*Total Encounter Time as defined by the Centers for Medicare and Medicaid Services includes, in addition to the face-to-face time of a patient visit (documented in the note above) non-face-to-face time: obtaining and reviewing outside history, ordering and reviewing medications, tests or procedures, care coordination (communications with other health care professionals or caregivers) and documentation in the medical record.

## 2022-12-14 ENCOUNTER — Encounter: Payer: Self-pay | Admitting: Hematology

## 2022-12-14 DIAGNOSIS — Z5111 Encounter for antineoplastic chemotherapy: Secondary | ICD-10-CM | POA: Diagnosis not present

## 2022-12-14 DIAGNOSIS — C8378 Burkitt lymphoma, lymph nodes of multiple sites: Secondary | ICD-10-CM | POA: Diagnosis not present

## 2022-12-14 LAB — COMPREHENSIVE METABOLIC PANEL
ALT: 34 U/L (ref 0–44)
AST: 22 U/L (ref 15–41)
Albumin: 3 g/dL — ABNORMAL LOW (ref 3.5–5.0)
Alkaline Phosphatase: 45 U/L (ref 38–126)
Anion gap: 6 (ref 5–15)
BUN: 16 mg/dL (ref 6–20)
CO2: 26 mmol/L (ref 22–32)
Calcium: 8.8 mg/dL — ABNORMAL LOW (ref 8.9–10.3)
Chloride: 102 mmol/L (ref 98–111)
Creatinine, Ser: 0.78 mg/dL (ref 0.61–1.24)
GFR, Estimated: >60 mL/min (ref >60)
Glucose, Bld: 108 mg/dL — ABNORMAL HIGH (ref 70–99)
Potassium: 3.8 mmol/L (ref 3.5–5.1)
Sodium: 134 mmol/L — ABNORMAL LOW (ref 135–145)
Total Bilirubin: 0.9 mg/dL (ref 0.3–1.2)
Total Protein: 6.1 g/dL — ABNORMAL LOW (ref 6.5–8.1)

## 2022-12-14 LAB — CBC WITH DIFFERENTIAL/PLATELET
Abs Immature Granulocytes: 0.02 10*3/uL (ref 0.00–0.07)
Basophils Absolute: 0 10*3/uL (ref 0.0–0.1)
Basophils Relative: 0 %
Eosinophils Absolute: 0 10*3/uL (ref 0.0–0.5)
Eosinophils Relative: 0 %
HCT: 32.9 % — ABNORMAL LOW (ref 39.0–52.0)
Hemoglobin: 10.4 g/dL — ABNORMAL LOW (ref 13.0–17.0)
Immature Granulocytes: 1 %
Lymphocytes Relative: 24 %
Lymphs Abs: 0.7 10*3/uL (ref 0.7–4.0)
MCH: 26.8 pg (ref 26.0–34.0)
MCHC: 31.6 g/dL (ref 30.0–36.0)
MCV: 84.8 fL (ref 80.0–100.0)
Monocytes Absolute: 0.2 10*3/uL (ref 0.1–1.0)
Monocytes Relative: 5 %
Neutro Abs: 2.1 10*3/uL (ref 1.7–7.7)
Neutrophils Relative %: 70 %
Platelets: 262 10*3/uL (ref 150–400)
RBC: 3.88 MIL/uL — ABNORMAL LOW (ref 4.22–5.81)
RDW: 16.8 % — ABNORMAL HIGH (ref 11.5–15.5)
WBC: 3 10*3/uL — ABNORMAL LOW (ref 4.0–10.5)
nRBC: 0 % (ref 0.0–0.2)

## 2022-12-14 LAB — URIC ACID: Uric Acid, Serum: 7.3 mg/dL (ref 3.7–8.6)

## 2022-12-14 MED ORDER — SENNOSIDES-DOCUSATE SODIUM 8.6-50 MG PO TABS: 2.0000 | ORAL_TABLET | Freq: Every evening | ORAL | 1 refills | Status: DC | PRN

## 2022-12-14 MED ORDER — DEXAMETHASONE 4 MG PO TABS: 4.0000 mg | ORAL_TABLET | Freq: Two times a day (BID) | ORAL | 0 refills | Status: DC

## 2022-12-14 MED ORDER — HEPARIN SOD (PORK) LOCK FLUSH 100 UNIT/ML IV SOLN
500.0000 [IU] | Freq: Once | INTRAVENOUS | Status: AC
  Administered 2022-12-14: 500 [IU] via INTRAVENOUS
  Filled 2022-12-14: qty 5

## 2022-12-14 NOTE — Progress Notes (Signed)
Chemotherapy dosage and calculations verified. Education completed and consent obtained for cyclophosphamide infusion. Pt has no questions or concerns at this time.

## 2022-12-14 NOTE — Discharge Summary (Incomplete)
. [facility]  [phone] [phone]    Physician Discharge Summary     Patient ID: Logan Ward MRN: [id number] DOB/AGE: [birth year] 41 y.o.  Admit date: 12/10/2022 Discharge date: 12/14/2022  Primary Care Physician:  Pcp, No   Discharge Diagnoses:    Present on Admission:  Burkitt's lymphoma [facility])   Discharge Medications:  Allergies as of 12/14/2022   No Known Allergies      Medication List     STOP taking these medications    butalbital-acetaminophen-caffeine 50-325-40 MG tablet Commonly known as: FIORICET   LORazepam 0.5 MG tablet Commonly known as: ATIVAN   mirtazapine 15 MG tablet Commonly known as: Remeron   oxyCODONE 5 MG immediate release tablet Commonly known as: Oxy IR/ROXICODONE       TAKE these medications    Biktarvy 50-200-25 MG Tabs tablet Generic drug: bictegravir-emtricitabine-tenofovir AF Take 1 tablet by mouth daily.   dexamethasone 4 MG tablet Commonly known as: DECADRON Take 1 tablet (4 mg total) by mouth 2 (two) times daily with breakfast and lunch for 2 days after completion of chemotherapy   lidocaine-prilocaine cream Commonly known as: EMLA Apply to affected area once What changed:  how much to take how to take this when to take this additional instructions   ondansetron 8 MG tablet Commonly known as: ZOFRAN Take 1 tablet (8 mg total) by mouth every 8 (eight) hours as needed for nausea.   polyethylene glycol 17 g packet Commonly known as: MIRALAX / GLYCOLAX Take 1 packet (17 g) by mouth daily.   prochlorperazine 10 MG tablet Commonly known as: COMPAZINE Take 1 tablet (10 mg total) by mouth every 6 (six) hours as needed. What changed: reasons to take this   senna-docusate 8.6-50 MG tablet Commonly known as: Senokot-S Take 2 tablets by mouth at bedtime as needed for mild constipation. What changed:  how much to take when to take this reasons to take this    sulfamethoxazole-trimethoprim 800-160 MG tablet Commonly known as: BACTRIM DS Take 1 tablet by mouth daily.   Tylenol 325 MG tablet Generic drug: acetaminophen Take 325-650 mg by mouth every 8 (eight) hours as needed for mild pain or headache.         Disposition and Follow-up:   Significant Diagnostic Studies:  DG FL GUIDED LUMBAR PUNCTURE  Result Date: 12/12/2022 CLINICAL DATA:  Burkitt's lymphoma.  HIV positive. EXAM: FLUOROSCOPICALLY GUIDED LUMBAR PUNCTURE FOR INTRATHECAL CHEMOTHERAPY FLUOROSCOPY: Radiation Exposure Index (as provided by the fluoroscopic device): 13.3 mGy Kerma PROCEDURE: Informed consent was obtained from the patient prior to the procedure, including potential complications of headache, allergy, and pain. With the patient prone, the lower back was prepped with Betadine. 1% Lidocaine was used for local anesthesia. Lumbar puncture was performed at the L3-4 level using a gauge needle with return of clear colorlessCSF. CSF sample delivered to the laboratory. Pharmacy prepared methotrexate was injected into the subarachnoid space. The patient tolerated the procedure well without apparent complication. IMPRESSION: Fluoroscopically guided lumbar puncture for intrathecal chemotherapy administration. Successful lumbar puncture and injection of intrathecal methotrexate. Electronically Signed   By: Franchot Gallo M.D.   On: 12/12/2022 11:03   DG FLUORO GUIDED LOC OF NEEDLE/CATH TIP FOR SPINAL INJECT LT  Result Date: 12/12/2022 CLINICAL DATA:  Burkitt's lymphoma.  HIV positive. EXAM: FLUOROSCOPICALLY GUIDED LUMBAR PUNCTURE FOR INTRATHECAL CHEMOTHERAPY FLUOROSCOPY: Radiation Exposure Index (as provided by the fluoroscopic device): 13.3 mGy Kerma PROCEDURE: Informed consent was obtained from the patient prior  to the procedure, including potential complications of headache, allergy, and pain. With the patient prone, the lower back was prepped with Betadine. 1% Lidocaine was used for  local anesthesia. Lumbar puncture was performed at the L3-4 level using a gauge needle with return of clear colorlessCSF. CSF sample delivered to the laboratory. Pharmacy prepared methotrexate was injected into the subarachnoid space. The patient tolerated the procedure well without apparent complication. IMPRESSION: Fluoroscopically guided lumbar puncture for intrathecal chemotherapy administration. Successful lumbar puncture and injection of intrathecal methotrexate. Electronically Signed   By: Franchot Gallo M.D.   On: 12/12/2022 11:03   DG Fluoro Guide Spinal/SI Jt Inj Left  Result Date: 11/20/2022 CLINICAL DATA:  Burkitt lymphoma, chemotherapy injection. EXAM: DIAGNOSTIC LUMBAR PUNCTURE UNDER FLUOROSCOPIC GUIDANCE COMPARISON:  None Available. FLUOROSCOPY: Radiation Exposure Index (as provided by the fluoroscopic device): 14.9 mGy Kerma PROCEDURE: Procedure was discussed with the patient including risks and benefits. Patient's questions were answered. Written informed consent for the procedure was obtained. Timeout protocol followed. L3-4 disc space was localized under C-arm fluoroscopy. Skin prepped and draped in usual sterile fashion. Skin and soft tissues anesthetized with  10 mL of 1% lidocaine. Under sterile technique, 20 gauge spinal needle advanced into spinal canal. Clear colorless CSF was encountered. 7 mL of CSF was obtained for requested analysis. Patient was then injected with 12 mg of methotrexate intrathecally. Procedure tolerated well by patient without immediate complication. IMPRESSION: Successful lumbar puncture with intrathecal methotrexate administration. Electronically Signed   By: Lorin Picket M.D.   On: 11/20/2022 14:08   DG FL GUIDED LUMBAR PUNCTURE  Result Date: 11/20/2022 Lorin Picket, MD     11/20/2022  2:08 PM CLINICAL DATA: [Burkitt lymphoma, chemotherapy injection.] EXAM: DIAGNOSTIC LUMBAR PUNCTURE UNDER FLUOROSCOPIC GUIDANCE COMPARISON: [None Available.] FLUOROSCOPY:  Radiation Exposure Index (as provided by the fluoroscopic device): [14.9] [mGy Kerma] PROCEDURE:   Procedure was discussed with the patient including risks and benefits.  Patient's questions were answered. Written informed consent for the procedure was obtained. Timeout protocol followed.  [L3-4] disc space was localized under C-arm fluoroscopy.  Skin prepped and draped in usual sterile fashion. Skin and soft tissues anesthetized with _0  10 mL of 1% lidocaine. Under sterile technique, [20 gauge] spinal needle advanced into spinal canal. Clear colorless CSF was encountered. 7 mL of CSF was obtained for requested analysis. Patient was then injected with 12 mg of methotrexate intrathecally. Procedure tolerated well by patient without immediate complication.   IMPRESSION: [Successful lumbar puncture with intrathecal methotrexate administration.] Lorin Picket, mD  MR BRAIN W WO CONTRAST  Result Date: 11/19/2022 CLINICAL DATA:  Lymphoma staging. EXAM: MRI HEAD WITHOUT AND WITH CONTRAST TECHNIQUE: Multiplanar, multiecho pulse sequences of the brain and surrounding structures were obtained without and with intravenous contrast. CONTRAST:  49m GADAVIST GADOBUTROL 1 MMOL/ML IV SOLN COMPARISON:  None Available. FINDINGS: Brain: No acute infarction, hemorrhage, hydrocephalus, extra-axial collection or mass lesion. Vascular: Normal flow voids. Skull and upper cervical spine: Normal marrow signal. Sinuses/Orbits: Negative. Other: None. IMPRESSION: No evidence of intracranial metastatic disease. Electronically Signed   By: HMarin RobertsM.D.   On: 11/19/2022 16:17   NM PET Image Initial (PI) Skull Base To Thigh (F-18 FDG)  Result Date: 11/19/2022 CLINICAL DATA:  Initial treatment strategy for Burkitt's lymphoma. Initiation of chemotherapy same day. EXAM: NUCLEAR MEDICINE PET SKULL BASE TO THIGH TECHNIQUE: 14.3 mCi F-18 FDG was injected intravenously. Full-ring PET imaging was performed from the skull base to thigh after  the radiotracer. CT data was obtained  and used for attenuation correction and anatomic localization. Fasting blood glucose: 14.3 mg/dl COMPARISON:  None Available. FINDINGS: Mediastinal blood pool activity: SUV max 2.7 Liver activity: SUV max 3.8 NECK: A mat of intensely hypermetabolic RIGHT enlarged cervical lymph nodes involve the RIGHT level 2, level 3 and RIGHT supraclavicular nodes. Nodal mass beneath the sternocleidomastoid muscle measures 27 mm on image 40 with SUV max equal 20.23 RIGHT neck mass superficial to the sternocleidomastoid muscle extends to the skin surface above the RIGHT clavicle measuring 5.1 cm with SUV max equal 33 point 1 (image 44). Incidental CT findings: None. CHEST: Hypermetabolic RIGHT jugular nodal conglomerateextends into the upper thoracic inlet and RIGHT upper mediastinum with SUV max equal 17 on image 50. No lower paratracheal adenopathy.  No hilar adenopathy. There are hypermetabolic RIGHT axillary nodes which are relatively small but intensely hypermetabolic. For example 8 mm node on image 58/4 with SUV max equal 8.6. Incidental CT findings: No suspicious pulmonary nodules. Port in the anterior chest wall with tip in distal SVC. ABDOMEN/PELVIS: No abnormal hypermetabolic activity within the liver, pancreas, adrenal glands, or spleen. No hypermetabolic lymph nodes in the abdomen or pelvis. Spleen is normal volume and normal metabolic activity. Activity along the anterior LEFT chest wall related to port ejection is incidental/artifactual. Incidental CT findings: None. SKELETON: No abnormal marrow activity suggest lymphoma involvement. Incidental CT findings: None. IMPRESSION: 1. Enlarged intense hypermetabolic RIGHT cervical nodal mass extending from the level 2 nodal station to the RIGHT supraclavicular nodal station. Findings consistent high-grade lymphoma 2. Superficial RIGHT neck mass extends to the skin surface superficial to the RIGHT sternocleidomastoid muscle. Intense  metabolic activity consistent with high-grade lymphoma. 3. Hypermetabolic small RIGHT axillary nodal disease. 4. No evidence of spleen involvement or solid organ involvement below the diaphragms. 5. No evidence of marrow involvement. Electronically Signed   By: Suzy Bouchard M.D.   On: 11/19/2022 16:02   IR IMAGING GUIDED PORT INSERTION  Result Date: 11/18/2022 INDICATION: 41 year old male with history of Burkitt lymphoma requiring central venous access for chemotherapy administration. EXAM: IMPLANTED PORT A CATH PLACEMENT WITH ULTRASOUND AND FLUOROSCOPIC GUIDANCE COMPARISON:  None Available. MEDICATIONS: None. ANESTHESIA/SEDATION: Moderate (conscious) sedation was employed during this procedure. A total of Versed 2 mg and Fentanyl 100 mcg was administered intravenously. Moderate Sedation Time: 22 minutes. The patient's level of consciousness and vital signs were monitored continuously by radiology nursing throughout the procedure under my direct supervision. CONTRAST:  None FLUOROSCOPY TIME:  Three mGy COMPLICATIONS: None immediate. PROCEDURE: The procedure, risks, benefits, and alternatives were explained to the patient. Questions regarding the procedure were encouraged and answered. The patient understands and consents to the procedure. The left neck and chest were prepped with chlorhexidine in a sterile fashion, and a sterile drape was applied covering the operative field. Maximum barrier sterile technique with sterile gowns and gloves were used for the procedure. A timeout was performed prior to the initiation of the procedure. Ultrasound was used to examine the jugular vein which was compressible and free of internal echoes. A skin marker was used to demarcate the planned venotomy and port pocket incision sites. Local anesthesia was provided to these sites and the subcutaneous tunnel track with 1% lidocaine with 1:100,000 epinephrine. A small incision was created at the jugular access site and blunt  dissection was performed of the subcutaneous tissues. Under ultrasound guidance, the jugular vein was accessed with a 21 ga micropuncture needle and an 0.018" wire was inserted to the superior vena cava. Real-time ultrasound  guidance was utilized for vascular access including the acquisition of a permanent ultrasound image documenting patency of the accessed vessel. A 5 Fr micopuncture set was then used, through which a 0.035" Rosen wire was passed under fluoroscopic guidance into the inferior vena cava. An 8 Fr dilator was then placed over the wire. A subcutaneous port pocket was then created along the upper chest wall utilizing a combination of sharp and blunt dissection. The pocket was irrigated with sterile saline, packed with gauze, and observed for hemorrhage. A single lumen "ISP" sized power injectable port was chosen for placement. The 8 Fr catheter was tunneled from the port pocket site to the venotomy incision. The port was placed in the pocket. The external catheter was trimmed to appropriate length. The dilator was exchanged for an 8 Fr peel-away sheath under fluoroscopic guidance. The catheter was then placed through the sheath and the sheath was removed. Final catheter positioning was confirmed and documented with a fluoroscopic spot radiograph. The port was accessed with a Huber needle, aspirated, and flushed with heparinized saline. The deep dermal layer of the port pocket incision was closed with interrupted 3-0 Vicryl suture. The skin was opposed with a running subcuticular 4-0 Monocryl suture. Dermabond was then placed over the port pocket and neck incisions. The patient tolerated the procedure well without immediate post procedural complication. FINDINGS: After catheter placement, the tip lies within the superior cavoatrial junction. The catheter aspirates and flushes normally and is ready for immediate use. IMPRESSION: Successful placement of a power injectable Port-A-Cath via the left internal  jugular vein. The catheter is ready for immediate use. Ruthann Cancer, MD [facility] Radiology Electronically Signed   By: Ruthann Cancer M.D.   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 EXAM: CT-GUIDED BONE MARROW BIOPSY AND ASPIRATION MEDICATIONS: None ANESTHESIA/SEDATION: Fentanyl 100 mcg IV; Versed 2 mg IV Sedation Time: 16 minutes; The patient was continuously monitored during the procedure by the interventional radiology nurse under my direct supervision. COMPLICATIONS: None immediate. PROCEDURE: Informed consent was obtained from the patient following an explanation of the procedure, risks, benefits and alternatives. The patient understands, agrees and consents for the procedure. All questions were addressed. A time out was performed prior to the initiation of the procedure. The patient was positioned prone and non-contrast localization CT was performed of the pelvis to demonstrate the iliac marrow spaces. The operative site was prepped and draped in the usual sterile fashion. Under sterile conditions and local anesthesia, a 22 gauge spinal needle was utilized for procedural planning. Next, an 11 gauge coaxial bone biopsy needle was advanced into the right iliac marrow space. Needle position was confirmed with CT imaging. Initially, a bone marrow aspiration was performed. Next, a bone marrow biopsy was obtained with the 11 gauge outer bone marrow device. Samples were prepared with the cytotechnologist and deemed adequate. The needle was removed and superficial hemostasis was obtained with manual compression. A dressing was applied. The patient tolerated the procedure well without immediate post procedural complication. IMPRESSION: Successful CT guided right iliac bone marrow aspiration and core biopsy. Ruthann Cancer, MD [facility] Radiology Electronically Signed   By: Ruthann Cancer M.D.   On: 11/18/2022 11:34   [place] EKG SITE RITE  Result Date: 11/15/2022 If Site Rite image not attached, placement could not be confirmed due to current cardiac rhythm.  ECHOCARDIOGRAM  COMPLETE  Result Date: 11/14/2022    ECHOCARDIOGRAM REPORT   Patient Name:   Logan Ward Date of Exam: 11/14/2022 Medical Rec #:  [id number]      Height:       73.0 in Accession #:    [id number]     Weight:       281.5 lb Date of Birth:  [birth year]       BSA:          2.488 m Patient Age:    40 years       BP:           129/79 mmHg Patient Gender: M              HR:           75 bpm. Exam Location:  Inpatient Procedure: 2D Echo, Color Doppler, Cardiac Doppler and Intracardiac            Opacification Agent Indications:    Chemo Z09  History:        Patient has no prior history of Echocardiogram examinations.                 Chest Mass; Signs/Symptoms:Shortness of Breath.  Sonographer:    Greer Pickerel Referring Phys: [id number] Brunetta Genera  Sonographer Comments: Image acquisition challenging due to patient body habitus and Image acquisition challenging due to respiratory motion. Global longitudinal strain was attempted. IMPRESSIONS  1. Left ventricular ejection fraction, by estimation, is 60 to 65%. The left ventricle has normal function. The left ventricle has no regional wall motion abnormalities. There is mild left ventricular hypertrophy. Left ventricular diastolic parameters were normal.  2. Right ventricular systolic function is normal. The right ventricular size is normal. Tricuspid regurgitation signal is inadequate for assessing PA pressure.  3. The mitral valve is normal in structure. No evidence of mitral valve regurgitation.  4. The aortic valve is tricuspid. Aortic valve regurgitation is not visualized.  5. The inferior vena cava is normal in size with greater than 50% respiratory variability, suggesting right atrial pressure of 3  mmHg. Comparison(s): No prior Echocardiogram. FINDINGS  Left Ventricle: Left ventricular ejection fraction, by estimation, is 60 to 65%. The left ventricle has normal function. The left ventricle has no regional wall motion abnormalities. Definity contrast agent was given IV to delineate the left ventricular  endocardial borders. The left ventricular internal cavity size was normal in size. There is mild left ventricular hypertrophy. Left ventricular diastolic parameters were normal. Right Ventricle: The right ventricular size is normal. No increase in right ventricular wall thickness. Right ventricular systolic function is normal. Tricuspid regurgitation signal is inadequate for assessing PA pressure. Left Atrium: Left atrial size was normal in size. Right Atrium: Right atrial size was normal in size. Pericardium: There is no evidence of pericardial effusion. Mitral Valve: The mitral valve is normal in structure. No evidence of mitral valve regurgitation. Tricuspid Valve: The tricuspid valve is normal in structure. Tricuspid valve regurgitation is not demonstrated. Aortic Valve: The aortic valve is tricuspid. Aortic valve regurgitation is not visualized. Pulmonic Valve: Pulmonic valve regurgitation is not visualized. Aorta: The aortic root and ascending aorta are structurally normal, with no evidence of dilitation. Venous: The inferior vena cava is normal in size with greater than 50% respiratory variability, suggesting right atrial pressure of 3 mmHg. IAS/Shunts: No atrial level shunt detected by color flow Doppler.  LEFT VENTRICLE PLAX 2D LVIDd:         3.60 cm  Diastology LVIDs:         2.40 cm   LV e' medial:    6.85 cm/s LV PW:         1.40 cm   LV E/e' medial:  13.3 LV IVS:        0.80 cm   LV e' lateral:   12.70 cm/s LVOT diam:     2.10 cm   LV E/e' lateral: 7.1 LV SV:         77 LV SV Index:   31 LVOT Area:     3.46 cm  RIGHT VENTRICLE RV S prime:     17.70 cm/s TAPSE (M-mode): 2.8 cm LEFT ATRIUM            Index        RIGHT ATRIUM           Index LA diam:      3.10 cm 1.25 cm/m   RA Area:     16.80 cm LA Vol (A2C): 46.4 ml 18.65 ml/m  RA Volume:   40.80 ml  16.40 ml/m LA Vol (A4C): 36.6 ml 14.71 ml/m  AORTIC VALVE LVOT Vmax:   134.00 cm/s LVOT Vmean:  81.400 cm/s LVOT VTI:    0.223 m  AORTA Ao Root diam: 3.80 cm Ao Asc diam:  3.20 cm MITRAL VALVE MV Area (PHT): 3.63 cm    SHUNTS MV Decel Time: 209 msec    Systemic VTI:  0.22 m MR Peak grad: 3.7 mmHg     Systemic Diam: 2.10 cm MR Vmax:      96.60 cm/s MV E velocity: 90.80 cm/s MV A velocity: 76.70 cm/s MV E/A ratio:  1.18 Landscape architect signed by Phineas Inches Signature Date/Time: 11/14/2022/4:58:35 PM    Final     Discharge Laboratory Values: _0 @  Brief H and P: For complete details please refer to admission H and P, but in brief, ***  Physical Exam at Discharge: BP 107/66 (BP Location: Right Arm)   Pulse 69   Temp 98.5 F (36.9 C) (Oral)   Resp 14   Ht _1  (1.854 m)   Wt 262 lb (118.8 kg)   SpO2 98%   BMI 34.57 kg/m      Hospital Course:  Principal Problem:   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[place]) Active Problems:   Encounter for chemotherapy management   Diet:    Activity:    Condition at Discharge:     Signed: Dr. Sullivan Lone MD [place] [phone]  12/14/2022, 10:27 AM

## 2022-12-17 ENCOUNTER — Other Ambulatory Visit: Payer: Self-pay | Admitting: Lab

## 2022-12-17 ENCOUNTER — Other Ambulatory Visit: Payer: Self-pay

## 2022-12-17 ENCOUNTER — Inpatient Hospital Stay: Payer: 59 | Attending: Hematology

## 2022-12-17 ENCOUNTER — Encounter: Payer: Self-pay | Admitting: Hematology

## 2022-12-17 ENCOUNTER — Inpatient Hospital Stay: Payer: 59

## 2022-12-17 ENCOUNTER — Inpatient Hospital Stay: Payer: 59 | Admitting: Hematology

## 2022-12-17 ENCOUNTER — Other Ambulatory Visit: Payer: Self-pay | Admitting: Hematology

## 2022-12-17 VITALS — BP 111/63 | HR 86 | Temp 98.2°F | Resp 18 | Wt 278.1 lb

## 2022-12-17 DIAGNOSIS — Z79624 Long term (current) use of inhibitors of nucleotide synthesis: Secondary | ICD-10-CM | POA: Insufficient documentation

## 2022-12-17 DIAGNOSIS — B20 Human immunodeficiency virus [HIV] disease: Secondary | ICD-10-CM | POA: Diagnosis present

## 2022-12-17 DIAGNOSIS — Z7189 Other specified counseling: Secondary | ICD-10-CM

## 2022-12-17 DIAGNOSIS — C8378 Burkitt lymphoma, lymph nodes of multiple sites: Secondary | ICD-10-CM

## 2022-12-17 DIAGNOSIS — D696 Thrombocytopenia, unspecified: Secondary | ICD-10-CM | POA: Insufficient documentation

## 2022-12-17 DIAGNOSIS — Z7952 Long term (current) use of systemic steroids: Secondary | ICD-10-CM | POA: Diagnosis not present

## 2022-12-17 DIAGNOSIS — Z79899 Other long term (current) drug therapy: Secondary | ICD-10-CM | POA: Diagnosis not present

## 2022-12-17 DIAGNOSIS — C8370 Burkitt lymphoma, unspecified site: Secondary | ICD-10-CM | POA: Diagnosis present

## 2022-12-17 DIAGNOSIS — Z5189 Encounter for other specified aftercare: Secondary | ICD-10-CM | POA: Insufficient documentation

## 2022-12-17 DIAGNOSIS — Z5112 Encounter for antineoplastic immunotherapy: Secondary | ICD-10-CM | POA: Insufficient documentation

## 2022-12-17 DIAGNOSIS — Z95828 Presence of other vascular implants and grafts: Secondary | ICD-10-CM

## 2022-12-17 LAB — SURGICAL PATHOLOGY

## 2022-12-17 MED ORDER — DIPHENHYDRAMINE HCL 25 MG PO CAPS
50.0000 mg | ORAL_CAPSULE | Freq: Once | ORAL | Status: AC
  Administered 2022-12-17: 50 mg via ORAL
  Filled 2022-12-17: qty 2

## 2022-12-17 MED ORDER — SODIUM CHLORIDE 0.9% FLUSH
10.0000 mL | INTRAVENOUS | Status: AC | PRN
  Administered 2022-12-17: 10 mL

## 2022-12-17 MED ORDER — FAMOTIDINE IN NACL 20-0.9 MG/50ML-% IV SOLN
20.0000 mg | Freq: Once | INTRAVENOUS | Status: AC
  Administered 2022-12-17: 20 mg via INTRAVENOUS
  Filled 2022-12-17: qty 50

## 2022-12-17 MED ORDER — HEPARIN SOD (PORK) LOCK FLUSH 100 UNIT/ML IV SOLN
500.0000 [IU] | Freq: Once | INTRAVENOUS | Status: AC | PRN
  Administered 2022-12-17: 500 [IU]

## 2022-12-17 MED ORDER — ACETAMINOPHEN 325 MG PO TABS
650.0000 mg | ORAL_TABLET | Freq: Once | ORAL | Status: AC
  Administered 2022-12-17: 650 mg via ORAL
  Filled 2022-12-17: qty 2

## 2022-12-17 MED ORDER — ALTEPLASE 2 MG IJ SOLR
2.0000 mg | Freq: Once | INTRAMUSCULAR | Status: AC | PRN
  Administered 2022-12-17: 2 mg
  Filled 2022-12-17: qty 2

## 2022-12-17 MED ORDER — SODIUM CHLORIDE 0.9 % IV SOLN
375.0000 mg/m2 | Freq: Once | INTRAVENOUS | Status: AC
  Administered 2022-12-17: 1000 mg via INTRAVENOUS
  Filled 2022-12-17: qty 100

## 2022-12-17 MED ORDER — SODIUM CHLORIDE 0.9% FLUSH
10.0000 mL | INTRAVENOUS | Status: DC | PRN
  Administered 2022-12-17: 10 mL

## 2022-12-17 MED ORDER — PEGFILGRASTIM-CBQV 6 MG/0.6ML ~~LOC~~ SOSY
6.0000 mg | PREFILLED_SYRINGE | Freq: Once | SUBCUTANEOUS | Status: AC
  Administered 2022-12-17: 6 mg via SUBCUTANEOUS
  Filled 2022-12-17: qty 0.6

## 2022-12-17 MED ORDER — SODIUM CHLORIDE 0.9 % IV SOLN: Freq: Once | INTRAVENOUS | Status: AC

## 2022-12-17 MED ORDER — METHYLPREDNISOLONE SODIUM SUCC 125 MG IJ SOLR
125.0000 mg | Freq: Once | INTRAMUSCULAR | Status: AC
  Administered 2022-12-17: 125 mg via INTRAVENOUS
  Filled 2022-12-17: qty 2

## 2022-12-17 NOTE — Progress Notes (Signed)
Pt is approved for the $1000 [employer] grant.  

## 2022-12-17 NOTE — Progress Notes (Signed)
Patient not seen by  MD today  Vitals are within treatment parameters.  Labs reviewed:  No labs needed today per Dr Irene Limbo  Per physician team, patient is ready for treatment and there are NO modifications to the treatment plan.

## 2022-12-17 NOTE — Patient Instructions (Signed)
[facility]  Discharge Instructions: Thank you for choosing [facility] to provide your oncology and hematology care.   If you have a lab appointment with the [facility], please go directly to the [facility] and check in at the registration area.   Wear comfortable clothing and clothing appropriate for easy access to any Portacath or PICC line.   We strive to give you quality time with your provider. You may need to reschedule your appointment if you arrive late (15 or more minutes).  Arriving late affects you and other patients whose appointments are after yours.  Also, if you miss three or more appointments without notifying the office, you may be dismissed from the clinic at the provider's discretion.      For prescription refill requests, have your pharmacy contact our office and allow 72 hours for refills to be completed.    Today you received the following chemotherapy and/or immunotherapy agents: Rituximab.    To help prevent nausea and vomiting after your treatment, we encourage you to take your nausea medication as directed.  BELOW ARE SYMPTOMS THAT SHOULD BE REPORTED IMMEDIATELY: *FEVER GREATER THAN 100.4 F (38 C) OR HIGHER *CHILLS OR SWEATING *NAUSEA AND VOMITING THAT IS NOT CONTROLLED WITH YOUR NAUSEA MEDICATION *UNUSUAL SHORTNESS OF BREATH *UNUSUAL BRUISING OR BLEEDING *URINARY PROBLEMS (pain or burning when urinating, or frequent urination) *BOWEL PROBLEMS (unusual diarrhea, constipation, pain near the anus) TENDERNESS IN MOUTH AND THROAT WITH OR WITHOUT PRESENCE OF ULCERS (sore throat, sores in mouth, or a toothache) UNUSUAL RASH, SWELLING OR PAIN  UNUSUAL VAGINAL DISCHARGE OR ITCHING   Items with * indicate a potential emergency and should be followed up as soon as possible or go to the Emergency Department if any problems should occur.  Please show the CHEMOTHERAPY ALERT CARD or IMMUNOTHERAPY ALERT CARD at check-in to  the Emergency Department and triage nurse.  Should you have questions after your visit or need to cancel or reschedule your appointment, please contact [facility]  Dept: [phone]  and follow the prompts.  Office hours are 8:00 a.m. to 4:30 p.m. Monday - Friday. Please note that voicemails left after 4:00 p.m. may not be returned until the following business day.  We are closed weekends and major holidays. You have access to a nurse at all times for urgent questions. Please call the main number to the clinic Dept: [phone] and follow the prompts.   For any non-urgent questions, you may also contact your provider using MyChart. We now offer e-Visits for anyone 75 and older to request care online for non-urgent symptoms. For details visit [email or web address].   Also download the MyChart app! Go to the app store, search "MyChart", open the app, select [facility], and log in with your MyChart username and password.

## 2022-12-18 ENCOUNTER — Inpatient Hospital Stay: Payer: 59 | Admitting: Licensed Clinical Social Worker

## 2022-12-18 NOTE — Progress Notes (Signed)
[facility] CSW Progress Note  Clinical Social Worker was contacted by patient via phone.  He requested a bag of food.  The last time he received one was on 12/02/22.  Patient to pick up today.  CSW informed Admin. Asst. Montine Circle, who arranged.  Patient stated he was doing well and had no other issues.    Margaree Mackintosh, LCSW  Clinical Social Worker

## 2022-12-19 ENCOUNTER — Other Ambulatory Visit (HOSPITAL_COMMUNITY): Payer: Self-pay

## 2022-12-19 ENCOUNTER — Encounter: Payer: Self-pay | Admitting: Family

## 2022-12-19 ENCOUNTER — Ambulatory Visit (INDEPENDENT_AMBULATORY_CARE_PROVIDER_SITE_OTHER): Payer: 59 | Admitting: Family

## 2022-12-19 ENCOUNTER — Other Ambulatory Visit: Payer: Self-pay

## 2022-12-19 VITALS — BP 110/73 | HR 97 | Temp 98.7°F | Resp 16 | Ht 73.0 in | Wt 282.0 lb

## 2022-12-19 DIAGNOSIS — Z0000 Encounter for general adult medical examination without abnormal findings: Secondary | ICD-10-CM | POA: Diagnosis not present

## 2022-12-19 DIAGNOSIS — C8371 Burkitt lymphoma, lymph nodes of head, face, and neck: Secondary | ICD-10-CM | POA: Diagnosis not present

## 2022-12-19 DIAGNOSIS — B20 Human immunodeficiency virus [HIV] disease: Secondary | ICD-10-CM

## 2022-12-19 MED ORDER — BIKTARVY 50-200-25 MG PO TABS
1.0000 | ORAL_TABLET | Freq: Every day | ORAL | 5 refills | Status: DC
  Filled 2022-12-19 – 2022-12-26 (×2): qty 30, 30d supply, fill #0
  Filled 2023-01-30 – 2023-02-03 (×3): qty 30, 30d supply, fill #1
  Filled 2023-02-28: qty 30, 30d supply, fill #2
  Filled 2023-03-31: qty 30, 30d supply, fill #3

## 2022-12-19 MED ORDER — SULFAMETHOXAZOLE-TRIMETHOPRIM 800-160 MG PO TABS
1.0000 | ORAL_TABLET | Freq: Every day | ORAL | 5 refills | Status: DC
  Filled 2022-12-19: qty 30, 30d supply, fill #0

## 2022-12-19 NOTE — Assessment & Plan Note (Signed)
Has received 2 cycles of chemotherapy so far and tolerating treatment with additional chemotherapy planned per Oncology. Will continue Bactrim for OI prophylaxis.

## 2022-12-19 NOTE — Assessment & Plan Note (Signed)
Discussed importance of safe sexual practice and condom use. Condoms and STD testing offered.  Declines vaccinations.  

## 2022-12-19 NOTE — Progress Notes (Signed)
Brief Narrative   Patient ID: Logan Ward, male    DOB: Aug 29, 1981, 42 y.o.   MRN: [id number]  Logan Ward is a 42 y/o AA male diagnosed with HIV disease in December 2020 and starting care after ED visit on 07/25/22 with initial viral load of 54,600 and CD4 count of 115. Risk factor for HIV acquisition is bisexual contact. 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 Initial lab work include Genotype are in progress. Started on Biktarvy and Bactrim.     Subjective:    Chief Complaint  Patient presents with   Follow-up    B20     HPI:  Logan Ward is a 42 y.o. male with HIV/AIDS last seen on 10/24/22 with good adherence and tolerance to Biktarvy supplemented with Bactrim for OI prophylaxis. Viral load was 324 with CD4 count of 501.  There was continued concern for neck swelling and was subsequently hospitalized with biopsy showing Burkitt's lymphoma and is undergoing chemotherapy with [facility] guided by Oncology and was continued on Biktarvy and Bactrim with Oncology. PET scan with right cervical and right lower neck mass and axillary lymphadenopathy and no CNS involvement or evidence of involvement below the diaphragm. Here today for follow up.   Logan Ward has been doing well and continues to take his Biktarvy and Bactrim as prescribed with no adverse side effects or problems obtaining medication from the pharmacy. Eating and drinking okay and had decreased taste. Had a good holiday. Will continue to receive chemotherapy. Condoms and STD testing offered. Declines vaccines.   Denies fevers, chills, night sweats, headaches, changes in vision, neck pain/stiffness, nausea, diarrhea, vomiting, lesions or rashes.   No Known Allergies    Outpatient Medications Prior to Visit  Medication Sig Dispense Refill   dexamethasone (DECADRON) 4 MG tablet Take 1 tablet (4 mg total) by mouth 2 (two) times daily with breakfast and lunch for 2 days after  completion of chemotherapy 30 tablet 0   lidocaine-prilocaine (EMLA) cream Apply to affected area once (Patient taking differently: Apply 1 Application topically once.) 30 g 3   ondansetron (ZOFRAN) 8 MG tablet Take 1 tablet (8 mg total) by mouth every 8 (eight) hours as needed for nausea. 30 tablet 3   bictegravir-emtricitabine-tenofovir AF (BIKTARVY) 50-200-25 MG TABS tablet Take 1 tablet by mouth daily. 30 tablet 0   polyethylene glycol (MIRALAX / GLYCOLAX) 17 g packet Take 1 packet (17 g) by mouth daily. (Patient not taking: Reported on 12/10/2022) 14 each 0   prochlorperazine (COMPAZINE) 10 MG tablet Take 1 tablet (10 mg total) by mouth every 6 (six) hours as needed. (Patient not taking: Reported on 12/19/2022) 30 tablet 2   senna-docusate (SENOKOT-S) 8.6-50 MG tablet Take 2 tablets by mouth at bedtime as needed for mild constipation. (Patient not taking: Reported on 12/19/2022) 60 tablet 1   TYLENOL 325 MG tablet Take 325-650 mg by mouth every 8 (eight) hours as needed for mild pain or headache. (Patient not taking: Reported on 12/19/2022)     sulfamethoxazole-trimethoprim (BACTRIM DS) 800-160 MG tablet Take 1 tablet by mouth daily. (Patient not taking: Reported on 12/19/2022) 30 tablet 5   No facility-administered medications prior to visit.     Past Medical History:  Diagnosis Date   Anal fissure    HIV infection ([place])    Internal hemorrhoids    Obesity    Rectal ulcer      Past Surgical History:  Procedure Laterality Date  IR IMAGING GUIDED PORT INSERTION  11/18/2022      Review of Systems  Constitutional:  Negative for appetite change, chills, fatigue, fever and unexpected weight change.  Eyes:  Negative for visual disturbance.  Respiratory:  Negative for cough, chest tightness, shortness of breath and wheezing.   Cardiovascular:  Negative for chest pain and leg swelling.  Gastrointestinal:  Negative for abdominal pain, constipation, diarrhea, nausea and vomiting.   Genitourinary:  Negative for dysuria, flank pain, frequency, genital sores, hematuria and urgency.  Skin:  Negative for rash.  Allergic/Immunologic: Negative for immunocompromised state.  Neurological:  Negative for dizziness and headaches.      Objective:    BP 110/73   Pulse 97   Temp 98.7 F (37.1 C) (Oral)   Resp 16   Ht '6\' 1"'$  (1.854 m)   Wt 282 lb (127.9 kg)   SpO2 99%   BMI 37.21 kg/m  Nursing note and vital signs reviewed.  Physical Exam Constitutional:      General: He is not in acute distress.    Appearance: He is well-developed.  Eyes:     Conjunctiva/sclera: Conjunctivae normal.  Cardiovascular:     Rate and Rhythm: Normal rate and regular rhythm.     Heart sounds: Normal heart sounds. No murmur heard.    No friction rub. No gallop.  Pulmonary:     Effort: Pulmonary effort is normal. No respiratory distress.     Breath sounds: Normal breath sounds. No wheezing or rales.  Chest:     Chest wall: No tenderness.  Abdominal:     General: Bowel sounds are normal.     Palpations: Abdomen is soft.     Tenderness: There is no abdominal tenderness.  Musculoskeletal:     Cervical back: Neck supple.  Lymphadenopathy:     Cervical: No cervical adenopathy.  Skin:    General: Skin is warm and dry.     Findings: No rash.  Neurological:     Mental Status: He is alert and oriented to person, place, and time.  Psychiatric:        Behavior: Behavior normal.        Thought Content: Thought content normal.        Judgment: Judgment normal.         12/19/2022    1:46 PM 10/24/2022    9:41 AM 08/28/2022   11:04 AM 07/25/2022    2:37 PM  Depression screen PHQ 2/9  Decreased Interest 0 0 0 0  Down, Depressed, Hopeless 0 0 1 0  PHQ - 2 Score 0 0 1 0       Assessment & Plan:    Patient Active Problem List   Diagnosis Date Noted   Healthcare maintenance 12/19/2022   Encounter for chemotherapy management 12/12/2022   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[place]) 12/10/2022    Decreased appetite 12/02/2022   Drug-induced constipation 11/21/2022   Nausea without vomiting 11/21/2022   Acute nonintractable headache 11/21/2022   Encounter for antineoplastic chemotherapy 11/18/2022   Burkitt lymphoma of lymph nodes of multiple regions [facility]) 11/14/2022   Counseling regarding advance care planning and goals of care 11/14/2022   Chest mass 11/10/2022   Exposure to chlamydia 10/24/2022   Submental lymphadenopathy 09/05/2022   Goals of care, counseling/discussion 07/26/2022   History of syphilis 07/26/2022   Neck swelling 07/26/2022   AIDS (acquired immune deficiency syndrome) ([place]) 07/25/2022     Problem List Items Addressed This Visit       Other  AIDS (acquired immune deficiency syndrome) ([place])    Logan Ward continues to have well controlled virus with good adherence and tolerance to [employer] supplemented with Bactrim for OI prophylaxis. Reviewed previous lab work and discussed plan of care. Check CD4 and HIV RNA level. Continue current dose of Bactrim for now for OI prophylaxis in the setting of chemotherapy. Continue Biktarvy. Plan for follow up in 3 months or sooner if needed with lab work on the same day.       Relevant Medications   sulfamethoxazole-trimethoprim (BACTRIM DS) 800-160 MG tablet   bictegravir-emtricitabine-tenofovir AF (BIKTARVY) 50-200-25 MG TABS tablet   Other Relevant Orders   HIV-1 RNA quant-no reflex-bld   T-helper cell (CD4)- ([facility] clinic only)   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[place]) - Primary    Has received 2 cycles of chemotherapy so far and tolerating treatment with additional chemotherapy planned per Oncology. Will continue Bactrim for OI prophylaxis.       Relevant Medications   sulfamethoxazole-trimethoprim (BACTRIM DS) 800-160 MG tablet   bictegravir-emtricitabine-tenofovir AF (BIKTARVY) 50-200-25 MG TABS tablet   Healthcare maintenance    Discussed importance of safe sexual practice and condom use. Condoms and STD testing offered.   Declines vaccinations.       Other Visit Diagnoses     HIV disease ([place])       Relevant Medications   sulfamethoxazole-trimethoprim (BACTRIM DS) 800-160 MG tablet   bictegravir-emtricitabine-tenofovir AF (BIKTARVY) 50-200-25 MG TABS tablet        I am having Rashaad L. Velis maintain his lidocaine-prilocaine, polyethylene glycol, ondansetron, prochlorperazine, Tylenol, dexamethasone, senna-docusate, sulfamethoxazole-trimethoprim, and Biktarvy.   Meds ordered this encounter  Medications   sulfamethoxazole-trimethoprim (BACTRIM DS) 800-160 MG tablet    Sig: Take 1 tablet by mouth daily.    Dispense:  30 tablet    Refill:  5    Order Specific Question:   Supervising Provider    Answer:   Baxter Flattery, CYNTHIA [[id number]]   bictegravir-emtricitabine-tenofovir AF (BIKTARVY) 50-200-25 MG TABS tablet    Sig: Take 1 tablet by mouth daily.    Dispense:  30 tablet    Refill:  5    Order Specific Question:   Supervising Provider    Answer:   Carlyle Basques [phone]    Order Specific Question:   Lot Number?    Answer:   [place]    Order Specific Question:   Expiration Date?    Answer:   08/16/2024    Order Specific Question:   Quantity    Answer:   28     Follow-up: Return in about 3 months (around 03/20/2023), or if symptoms worsen or fail to improve.   Terri Piedra, MSN, FNP-C Nurse Practitioner [facility] [place] number: [phone]

## 2022-12-19 NOTE — Assessment & Plan Note (Signed)
Carmen continues to have well controlled virus with good adherence and tolerance to [employer] supplemented with Bactrim for OI prophylaxis. Reviewed previous lab work and discussed plan of care. Check CD4 and HIV RNA level. Continue current dose of Bactrim for now for OI prophylaxis in the setting of chemotherapy. Continue Biktarvy. Plan for follow up in 3 months or sooner if needed with lab work on the same day.

## 2022-12-19 NOTE — Patient Instructions (Signed)
Nice to see you.  We will check your lab work today.  Continue to take your medication daily as prescribed.  Refills have been sent to the pharmacy.  Plan for follow up in 3 months or sooner if needed with lab work on the same day.  Have a great day and stay safe!  

## 2022-12-20 ENCOUNTER — Other Ambulatory Visit: Payer: Self-pay

## 2022-12-20 LAB — T-HELPER CELL (CD4) - (RCID CLINIC ONLY)
CD4 % Helper T Cell: 32 % — ABNORMAL LOW (ref 33–65)
CD4 T Cell Abs: 190 /uL — ABNORMAL LOW (ref 400–1790)

## 2022-12-23 ENCOUNTER — Inpatient Hospital Stay (HOSPITAL_BASED_OUTPATIENT_CLINIC_OR_DEPARTMENT_OTHER): Payer: 59 | Admitting: Hematology

## 2022-12-23 ENCOUNTER — Inpatient Hospital Stay: Payer: 59

## 2022-12-23 ENCOUNTER — Other Ambulatory Visit: Payer: Self-pay

## 2022-12-23 ENCOUNTER — Encounter: Payer: Self-pay | Admitting: Hematology

## 2022-12-23 ENCOUNTER — Encounter (HOSPITAL_COMMUNITY): Payer: Self-pay

## 2022-12-23 ENCOUNTER — Other Ambulatory Visit (HOSPITAL_COMMUNITY): Payer: Self-pay

## 2022-12-23 ENCOUNTER — Inpatient Hospital Stay: Payer: 59 | Admitting: Nutrition

## 2022-12-23 VITALS — BP 125/84 | HR 107 | Temp 98.1°F | Resp 20 | Wt 273.8 lb

## 2022-12-23 DIAGNOSIS — C8378 Burkitt lymphoma, lymph nodes of multiple sites: Secondary | ICD-10-CM | POA: Diagnosis not present

## 2022-12-23 DIAGNOSIS — Z5112 Encounter for antineoplastic immunotherapy: Secondary | ICD-10-CM | POA: Diagnosis not present

## 2022-12-23 DIAGNOSIS — Z5111 Encounter for antineoplastic chemotherapy: Secondary | ICD-10-CM

## 2022-12-23 LAB — CBC WITH DIFFERENTIAL (CANCER CENTER ONLY)
Abs Immature Granulocytes: 1.09 10*3/uL — ABNORMAL HIGH (ref 0.00–0.07)
Basophils Absolute: 0 10*3/uL (ref 0.0–0.1)
Basophils Relative: 0 %
Eosinophils Absolute: 0 10*3/uL (ref 0.0–0.5)
Eosinophils Relative: 0 %
HCT: 34.1 % — ABNORMAL LOW (ref 39.0–52.0)
Hemoglobin: 11.2 g/dL — ABNORMAL LOW (ref 13.0–17.0)
Immature Granulocytes: 9 %
Lymphocytes Relative: 11 %
Lymphs Abs: 1.3 10*3/uL (ref 0.7–4.0)
MCH: 27 pg (ref 26.0–34.0)
MCHC: 32.8 g/dL (ref 30.0–36.0)
MCV: 82.2 fL (ref 80.0–100.0)
Monocytes Absolute: 2.4 10*3/uL — ABNORMAL HIGH (ref 0.1–1.0)
Monocytes Relative: 21 %
Neutro Abs: 6.9 10*3/uL (ref 1.7–7.7)
Neutrophils Relative %: 59 %
Platelet Count: 71 10*3/uL — ABNORMAL LOW (ref 150–400)
RBC: 4.15 MIL/uL — ABNORMAL LOW (ref 4.22–5.81)
RDW: 17.3 % — ABNORMAL HIGH (ref 11.5–15.5)
Smear Review: NORMAL
WBC Count: 11.6 10*3/uL — ABNORMAL HIGH (ref 4.0–10.5)
nRBC: 4.5 % — ABNORMAL HIGH (ref 0.0–0.2)

## 2022-12-23 LAB — CMP (CANCER CENTER ONLY)
ALT: 23 U/L (ref 0–44)
AST: 31 U/L (ref 15–41)
Albumin: 4.2 g/dL (ref 3.5–5.0)
Alkaline Phosphatase: 84 U/L (ref 38–126)
Anion gap: 8 (ref 5–15)
BUN: 6 mg/dL (ref 6–20)
CO2: 29 mmol/L (ref 22–32)
Calcium: 10 mg/dL (ref 8.9–10.3)
Chloride: 99 mmol/L (ref 98–111)
Creatinine: 1.2 mg/dL (ref 0.61–1.24)
GFR, Estimated: >60 mL/min (ref >60)
Glucose, Bld: 90 mg/dL (ref 70–99)
Potassium: 4 mmol/L (ref 3.5–5.1)
Sodium: 136 mmol/L (ref 135–145)
Total Bilirubin: 0.9 mg/dL (ref 0.3–1.2)
Total Protein: 6.7 g/dL (ref 6.5–8.1)

## 2022-12-23 LAB — HIV-1 RNA QUANT-NO REFLEX-BLD
HIV 1 RNA Quant: 77 Copies/mL — ABNORMAL HIGH
HIV-1 RNA Quant, Log: 1.89 Log cps/mL — ABNORMAL HIGH

## 2022-12-23 LAB — URIC ACID: Uric Acid, Serum: 10.4 mg/dL — ABNORMAL HIGH (ref 3.7–8.6)

## 2022-12-23 MED ORDER — HEPARIN SOD (PORK) LOCK FLUSH 100 UNIT/ML IV SOLN
500.0000 [IU] | Freq: Once | INTRAVENOUS | Status: AC
  Administered 2022-12-23: 500 [IU]

## 2022-12-23 MED ORDER — GABAPENTIN 100 MG PO CAPS
200.0000 mg | ORAL_CAPSULE | Freq: Every day | ORAL | 0 refills | Status: DC
  Filled 2022-12-23: qty 60, 21d supply, fill #0

## 2022-12-23 MED ORDER — ALTEPLASE 2 MG IJ SOLR
2.0000 mg | Freq: Once | INTRAMUSCULAR | Status: AC
  Administered 2022-12-23: 2 mg
  Filled 2022-12-23: qty 2

## 2022-12-23 MED ORDER — SODIUM CHLORIDE 0.9% FLUSH
10.0000 mL | Freq: Once | INTRAVENOUS | Status: AC
  Administered 2022-12-23: 10 mL

## 2022-12-23 NOTE — Progress Notes (Signed)
42 year old male diagnosed with Burkitt's lymphoma receiving EPOCH-R and followed by Dr. Irene Limbo.  Past medical history includes HIV/AIDS, obesity, rectal ulcer, and anal fissure.  Medications include Decadron, Ativan, Remeron, Zofran, MiraLAX, Compazine, and Senokot as.  Labs include glucose of 135 on December 18.  Labs pending today.  Height: 6 feet 1 inch. Weight: 271.4 lb Usual body weight: Varies between 265 pounds and 282 pounds. BMI: 35.81.  Patient reports he has nausea if he eats something that he does not have a taste for.  He also describes nausea with certain smells of different foods.  He denies problems swallowing.  He complains of increased gas.  He denies diarrhea and constipation.  Nutrition diagnosis: Food and nutrition related knowledge deficit related to Burkitt's lymphoma as evidenced by no prior need for nutrition related information.  Intervention: Educated on importance of increasing calories and protein in small frequent meals and snacks for weight maintenance.  Provided nutrition fact sheet on soft protein foods. Reviewed strategies for improving nausea.  Provided nutrition fact sheet. Reviewed strategies for improving gas and provided nutrition fact sheet. Encouraged antiemetics as tolerated. Provided samples of Ensure max and coupons. Provided food bag from food pantry.  Monitoring, evaluation, goals: Patient will tolerate adequate calories and protein to minimize weight loss throughout treatment.  Next visit: To be scheduled if needed.  **Disclaimer: This note was dictated with voice recognition software. Similar sounding words can inadvertently be transcribed and this note may contain transcription errors which may not have been corrected upon publication of note.**

## 2022-12-26 ENCOUNTER — Other Ambulatory Visit (HOSPITAL_COMMUNITY): Payer: Self-pay

## 2022-12-27 LAB — ACID FAST CULTURE WITH REFLEXED SENSITIVITIES (MYCOBACTERIA): Acid Fast Culture: NEGATIVE

## 2022-12-29 ENCOUNTER — Encounter: Payer: Self-pay | Admitting: Hematology

## 2022-12-29 NOTE — Progress Notes (Signed)
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  The patient presented with a rapidly growing right cervical nodal mass extending into the level 2 nodal station of the right supraclavicular nodal station and extension into the superficial right sternocleidomastoid muscle.  There is also hypermetabolic right axillary nodal disease.  No evidence of spleen or solid organ involvement.  No evidence of marrow involvement.  The patient was diagnosed in November/December 2023  HISTORY OF PRESENTING ILLNESS:  Logan Ward is a wonderful 42 y.o. male who has been referred to [place] by Dr Starla Link MD for evaluation and management of newly diagnosed Burkitt's lymphoma in the setting of known history of HIV/AIDS.   Patient has a history of HIV/AIDS [last CD4 count on 10/21/2022 of 501 and viral load of 324 copies per mL, acquired through by sexual contact, diagnosed 3 to 4 months ago, follows with Marya Amsler [facility]], syphilis, obesity presented to the hospital on 11/10/2022 with rapidly growing right anterior chest wall mass.   CT of the neck on 11/10/2022 showed large infiltrative centrally necrotic soft tissue mass throughout the right neck and extending to the upper chest anterior to the clavicle measuring 8.1 x 5.9 x 7.4 cm in size.  There are numerous additional prominent right sided cervical chain lymph nodes measuring 1 cm at level 2A and 1.1 cm in the posterior triangle. CT chest abdomen pelvis done on 11/10/2022 showed enlarged right lower cervical, supraclavicular and bilaterally axillary lymph nodes.  Largest right axillary lymph node measuring 2.2 x 1.7 cm.  Diffuse fat stranding throughout the superior mediastinum.  Prominent subcentimeter retroperitoneal and iliac lymph nodes.  Diffuse retroperitoneal fat stranding of uncertain significance.  No evidence of mass or evidence of organ metastatic disease in the  abdomen or pelvis.  Patient had an ultrasound-guided core needle biopsy of the right neck mass on 11/12/2022 which shows B-cell non-Hodgkin's lymphoma with high-grade features and findings consistent with Burkitt's lymphoma.    PRIOR THERAPY: None  CURRENT THERAPY: Status post 2 cycles of dose adjusted EPOCH-R chemotherapy.  First dose started on 11/18/22.   INTERVAL HISTORY:  Logan Ward 42 y.o. male with recently diagnosed Burkitt's lymphoma who is here for follow-up in clinic for toxicity check after having completed his second cycle of dose adjusted EPOCH-R. He notes no fevers no chills no night sweats.  No new lumps or bumps. Tolerated his second dose of outpatient Rituxan well. Labs done today were reviewed in detail with the patient.   MEDICAL HISTORY: Past Medical History:  Diagnosis Date   Anal fissure    HIV infection ([place])    Internal hemorrhoids    Obesity    Rectal ulcer     ALLERGIES:  has No Known Allergies.  MEDICATIONS:  Current Outpatient Medications  Medication Sig Dispense Refill   gabapentin (NEURONTIN) 100 MG capsule Take 2 capsules (200 mg total) by mouth at bedtime. May increase to '300mg'$  (3 capsules) at bedtime if still having back pain/leg cramps after 3-4 days. 60 capsule 0   bictegravir-emtricitabine-tenofovir AF (BIKTARVY) 50-200-25 MG TABS tablet Take 1 tablet by mouth daily. 30 tablet 5   dexamethasone (DECADRON) 4 MG tablet Take 1 tablet (4 mg total) by mouth 2 (two) times daily with breakfast and lunch for 2 days after completion of chemotherapy 30 tablet 0   lidocaine-prilocaine (EMLA) cream Apply to affected area once (Patient taking differently: Apply 1 Application topically  once.) 30 g 3   ondansetron (ZOFRAN) 8 MG tablet Take 1 tablet (8 mg total) by mouth every 8 (eight) hours as needed for nausea. 30 tablet 3   polyethylene glycol (MIRALAX / GLYCOLAX) 17 g packet Take 1 packet (17 g) by mouth daily. (Patient not taking: Reported on  12/10/2022) 14 each 0   prochlorperazine (COMPAZINE) 10 MG tablet Take 1 tablet (10 mg total) by mouth every 6 (six) hours as needed. (Patient not taking: Reported on 12/19/2022) 30 tablet 2   senna-docusate (SENOKOT-S) 8.6-50 MG tablet Take 2 tablets by mouth at bedtime as needed for mild constipation. (Patient not taking: Reported on 12/19/2022) 60 tablet 1   sulfamethoxazole-trimethoprim (BACTRIM DS) 800-160 MG tablet Take 1 tablet by mouth daily. 30 tablet 5   TYLENOL 325 MG tablet Take 325-650 mg by mouth every 8 (eight) hours as needed for mild pain or headache. (Patient not taking: Reported on 12/19/2022)     No current facility-administered medications for this visit.    SURGICAL HISTORY:  Past Surgical History:  Procedure Laterality Date   IR IMAGING GUIDED PORT INSERTION  11/18/2022    REVIEW OF SYSTEMS:   10 Point review of Systems was done is negative except as noted above.   PHYSICAL EXAMINATION:  Blood pressure 125/84, pulse (!) 107, temperature 98.1 F (36.7 C), resp. rate 20, weight 273 lb 12.8 oz (124.2 kg), SpO2 100 %.  ECOG PERFORMANCE STATUS: 1 NAD GENERAL:alert, in no acute distress and comfortable SKIN: no acute rashes, no significant lesions EYES: conjunctiva are pink and non-injected, sclera anicteric OROPHARYNX: MMM, no exudates, no oropharyngeal erythema or ulceration NECK: supple, no JVD LYMPH:  no palpable lymphadenopathy in the cervical, axillary or inguinal regions LUNGS: clear to auscultation b/l with normal respiratory effort HEART: regular rate & rhythm ABDOMEN:  normoactive bowel sounds , non tender, not distended. Extremity: no pedal edema PSYCH: alert & oriented x 3 with fluent speech NEURO: no focal motor/sensory deficits  LABORATORY DATA:  .    Latest Ref Rng & Units 12/23/2022   12:16 PM 12/14/2022    5:00 AM 12/13/2022    5:00 AM  CBC  WBC 4.0 - 10.5 K/uL 11.6  3.0  4.8   Hemoglobin 13.0 - 17.0 g/dL 11.2  10.4  10.8   Hematocrit 39.0 -  52.0 % 34.1  32.9  34.2   Platelets 150 - 400 K/uL 71  262  239       Latest Ref Rng & Units 12/23/2022   12:16 PM 12/14/2022    5:00 AM 12/13/2022    5:00 AM  CMP  Glucose 70 - 99 mg/dL 90  108  119   BUN 6 - 20 mg/dL '6  16  14   '$ Creatinine 0.61 - 1.24 mg/dL 1.20  0.78  0.85   Sodium 135 - 145 mmol/L 136  134  134   Potassium 3.5 - 5.1 mmol/L 4.0  3.8  4.1   Chloride 98 - 111 mmol/L 99  102  104   CO2 22 - 32 mmol/L '29  26  26   '$ Calcium 8.9 - 10.3 mg/dL 10.0  8.8  9.0   Total Protein 6.5 - 8.1 g/dL 6.7  6.1  6.5   Total Bilirubin 0.3 - 1.2 mg/dL 0.9  0.9  1.2   Alkaline Phos 38 - 126 U/L 84  45  48   AST 15 - 41 U/L '31  22  24   '$ ALT 0 -  44 U/L 23  34  35    Uric acid 10.4    RADIOGRAPHIC STUDIES:  DG FL GUIDED LUMBAR PUNCTURE  Result Date: 12/12/2022 CLINICAL DATA:  Burkitt's lymphoma.  HIV positive. EXAM: FLUOROSCOPICALLY GUIDED LUMBAR PUNCTURE FOR INTRATHECAL CHEMOTHERAPY FLUOROSCOPY: Radiation Exposure Index (as provided by the fluoroscopic device): 13.3 mGy Kerma PROCEDURE: Informed consent was obtained from the patient prior to the procedure, including potential complications of headache, allergy, and pain. With the patient prone, the lower back was prepped with Betadine. 1% Lidocaine was used for local anesthesia. Lumbar puncture was performed at the L3-4 level using a gauge needle with return of clear colorlessCSF. CSF sample delivered to the laboratory. Pharmacy prepared methotrexate was injected into the subarachnoid space. The patient tolerated the procedure well without apparent complication. IMPRESSION: Fluoroscopically guided lumbar puncture for intrathecal chemotherapy administration. Successful lumbar puncture and injection of intrathecal methotrexate. Electronically Signed   By: Franchot Gallo M.D.   On: 12/12/2022 11:03   DG FLUORO GUIDED LOC OF NEEDLE/CATH TIP FOR SPINAL INJECT LT  Result Date: 12/12/2022 CLINICAL DATA:  Burkitt's lymphoma.  HIV positive. EXAM:  FLUOROSCOPICALLY GUIDED LUMBAR PUNCTURE FOR INTRATHECAL CHEMOTHERAPY FLUOROSCOPY: Radiation Exposure Index (as provided by the fluoroscopic device): 13.3 mGy Kerma PROCEDURE: Informed consent was obtained from the patient prior to the procedure, including potential complications of headache, allergy, and pain. With the patient prone, the lower back was prepped with Betadine. 1% Lidocaine was used for local anesthesia. Lumbar puncture was performed at the L3-4 level using a gauge needle with return of clear colorlessCSF. CSF sample delivered to the laboratory. Pharmacy prepared methotrexate was injected into the subarachnoid space. The patient tolerated the procedure well without apparent complication. IMPRESSION: Fluoroscopically guided lumbar puncture for intrathecal chemotherapy administration. Successful lumbar puncture and injection of intrathecal methotrexate. Electronically Signed   By: Franchot Gallo M.D.   On: 12/12/2022 11:03     ASSESSMENT/PLAN:  42 year old male with HIV/AIDS with newly diagnosed Burkitt's lymphoma   #1  Stage II recently diagnosed Burkitt's lymphoma Presenting with rapidly growing right neck mass extending into the chest, bilateral x-ray lymphadenopathy and also concern for possible involvement of retroperitoneal lymph nodes. Concerning for at least stage II possibly stage III disease. Some borderline lymphadenopathy in the abdomen could also be from his recently diagnosed HIV/AIDS. No clinical symptoms suggestive of CNS involvement at this time. Brain MRI negative.  No constitutional symptoms. No significant cytopenias to suggest bone marrow involvement. Discussed with patient and he is not interested in fertility preservation or sperm banking. First presented in August at [facility] and was treated empirically with steroids and antibiotics with improvement in swelling prior to additional progression. -Currently on dose adjusted EPOCH-R. Status post 1 cycle.  Tolerated well overall.  -Scheduled for cycle #2 on 12/10/22. Will help facilitate inpatient admission. -Outpatient follow up on 12/17/22 for Rituxan and Neulasta   #2 HIV/AIDS recently diagnosed 3 to 4 months ago. Following Dr. Elna Breslow from [place] and currently taking Biktarvy.   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  Patient reports this was completely treated.   #4 spontaneous tumor lysis syndrome:  #5 Prophylaxis: Continue salt water and baking soda mouthwash rinses 4 times a day to reduce mucositis.  MiraLAX and senna as for constipation Bactrim for prophylactic PJP.   #6:Decreased appetite, nausea, belching, and weight loss: Patient has lost weight since last being seen.  Drug to drug interactions noted with PPIs methotrexate.  Patient encouraged to  try Pepcid and take nausea medicine prior to eating.  I sent a prescription for Compazine to alternate with Zofran if needed for nausea.  I will refer the patient to member the nutritionist team.  Patient Dors is some sensation of feeling like food is getting stuck in his esophagus.  I offered referral to GI.  The patient declined at this time.  Patient was open to trying Remeron which is an antidepressant but it can help patient's with insomnia and decreased appetite.  I sent a prescription for this to the patient's pharmacy.  #7: Thrombocytopenia: Platelets have normalized during previous treatment but with nadir down to 71k    PLAN: -Seen for toxicity check after cycle 2 of dose adjusted EPOCH-R chemotherapy Labs done today were discussed in detail with him. He reports no new significant toxicities other than grade 1 fatigue and some dysgeusia. Given his drop in platelet counts to 71k would hold off on dose escalation with cycle 3 of dose adjusted EPOCH-R and keep him on standard doses. Discussed slight increase in his uric acid level and he was encouraged to drink at least 2 L of water daily, He will be scheduled for his inpatient  cycle 3 of dose adjusted EPOCH-R from 12/30/2022 He will get his third dose of intrathecal methotrexate on cycle 3-day 3 of treatment as inpatient Orders reviewed and signed   FOLLOW-UP: Inpatient admission for cycle 3 of dose adjusted EPOCH are on 12/30/2022 Outpatient Rituxan and Neulasta on 01/06/2023  The total time spent in the appointment was 32 minutes*.  All of the patient's questions were answered with apparent satisfaction. The patient knows to call the clinic with any problems, questions or concerns.   Sullivan Lone MD MS AAHIVMS [facility] Hematology/Oncology Physician [name]  .*Total Encounter Time as defined by the Centers for Medicare and Medicaid Services includes, in addition to the face-to-face time of a patient visit (documented in the note above) non-face-to-face time: obtaining and reviewing outside history, ordering and reviewing medications, tests or procedures, care coordination (communications with other health care professionals or caregivers) and documentation in the medical record.

## 2022-12-30 ENCOUNTER — Encounter (HOSPITAL_COMMUNITY): Payer: Self-pay | Admitting: Hematology

## 2022-12-30 ENCOUNTER — Other Ambulatory Visit (HOSPITAL_COMMUNITY): Payer: Self-pay

## 2022-12-30 ENCOUNTER — Other Ambulatory Visit: Payer: Self-pay

## 2022-12-30 ENCOUNTER — Inpatient Hospital Stay (HOSPITAL_COMMUNITY)
Admission: RE | Admit: 2022-12-30 | Discharge: 2023-01-03 | DRG: 976 | Disposition: A | Discharge status: Home / Self Care | Payer: 59 | Source: Ambulatory Visit | Attending: Hematology | Admitting: Hematology

## 2022-12-30 DIAGNOSIS — C8371 Burkitt lymphoma, lymph nodes of head, face, and neck: Secondary | ICD-10-CM | POA: Diagnosis not present

## 2022-12-30 DIAGNOSIS — D6481 Anemia due to antineoplastic chemotherapy: Secondary | ICD-10-CM | POA: Diagnosis present

## 2022-12-30 DIAGNOSIS — Z79899 Other long term (current) drug therapy: Secondary | ICD-10-CM

## 2022-12-30 DIAGNOSIS — Z5111 Encounter for antineoplastic chemotherapy: Secondary | ICD-10-CM | POA: Diagnosis not present

## 2022-12-30 DIAGNOSIS — D649 Anemia, unspecified: Secondary | ICD-10-CM

## 2022-12-30 DIAGNOSIS — C8378 Burkitt lymphoma, lymph nodes of multiple sites: Secondary | ICD-10-CM

## 2022-12-30 DIAGNOSIS — C8370 Burkitt lymphoma, unspecified site: Secondary | ICD-10-CM | POA: Diagnosis present

## 2022-12-30 DIAGNOSIS — Z79631 Long term (current) use of antimetabolite agent: Secondary | ICD-10-CM | POA: Diagnosis not present

## 2022-12-30 DIAGNOSIS — B20 Human immunodeficiency virus [HIV] disease: Secondary | ICD-10-CM | POA: Diagnosis present

## 2022-12-30 DIAGNOSIS — K219 Gastro-esophageal reflux disease without esophagitis: Secondary | ICD-10-CM | POA: Diagnosis present

## 2022-12-30 DIAGNOSIS — Z8619 Personal history of other infectious and parasitic diseases: Secondary | ICD-10-CM

## 2022-12-30 DIAGNOSIS — T451X5A Adverse effect of antineoplastic and immunosuppressive drugs, initial encounter: Secondary | ICD-10-CM | POA: Diagnosis present

## 2022-12-30 DIAGNOSIS — Z808 Family history of malignant neoplasm of other organs or systems: Secondary | ICD-10-CM | POA: Diagnosis not present

## 2022-12-30 DIAGNOSIS — Z7189 Other specified counseling: Secondary | ICD-10-CM

## 2022-12-30 DIAGNOSIS — Z7901 Long term (current) use of anticoagulants: Secondary | ICD-10-CM

## 2022-12-30 DIAGNOSIS — K5903 Drug induced constipation: Secondary | ICD-10-CM | POA: Diagnosis present

## 2022-12-30 DIAGNOSIS — Z8249 Family history of ischemic heart disease and other diseases of the circulatory system: Secondary | ICD-10-CM

## 2022-12-30 LAB — COMPREHENSIVE METABOLIC PANEL
ALT: 20 U/L (ref 0–44)
AST: 22 U/L (ref 15–41)
Albumin: 3.5 g/dL (ref 3.5–5.0)
Alkaline Phosphatase: 50 U/L (ref 38–126)
Anion gap: 8 (ref 5–15)
BUN: 8 mg/dL (ref 6–20)
CO2: 26 mmol/L (ref 22–32)
Calcium: 8.7 mg/dL — ABNORMAL LOW (ref 8.9–10.3)
Chloride: 106 mmol/L (ref 98–111)
Creatinine, Ser: 1.07 mg/dL (ref 0.61–1.24)
GFR, Estimated: >60 mL/min (ref >60)
Glucose, Bld: 112 mg/dL — ABNORMAL HIGH (ref 70–99)
Potassium: 3.7 mmol/L (ref 3.5–5.1)
Sodium: 140 mmol/L (ref 135–145)
Total Bilirubin: 0.6 mg/dL (ref 0.3–1.2)
Total Protein: 6.4 g/dL — ABNORMAL LOW (ref 6.5–8.1)

## 2022-12-30 LAB — CBC WITH DIFFERENTIAL/PLATELET
Abs Immature Granulocytes: 0.04 10*3/uL (ref 0.00–0.07)
Basophils Absolute: 0 10*3/uL (ref 0.0–0.1)
Basophils Relative: 1 %
Eosinophils Absolute: 0 10*3/uL (ref 0.0–0.5)
Eosinophils Relative: 0 %
HCT: 29.4 % — ABNORMAL LOW (ref 39.0–52.0)
Hemoglobin: 8.9 g/dL — ABNORMAL LOW (ref 13.0–17.0)
Immature Granulocytes: 1 %
Lymphocytes Relative: 23 %
Lymphs Abs: 0.8 10*3/uL (ref 0.7–4.0)
MCH: 26.5 pg (ref 26.0–34.0)
MCHC: 30.3 g/dL (ref 30.0–36.0)
MCV: 87.5 fL (ref 80.0–100.0)
Monocytes Absolute: 0.5 10*3/uL (ref 0.1–1.0)
Monocytes Relative: 14 %
Neutro Abs: 2 10*3/uL (ref 1.7–7.7)
Neutrophils Relative %: 61 %
Platelets: 179 10*3/uL (ref 150–400)
RBC: 3.36 MIL/uL — ABNORMAL LOW (ref 4.22–5.81)
RDW: 18.7 % — ABNORMAL HIGH (ref 11.5–15.5)
WBC: 3.3 10*3/uL — ABNORMAL LOW (ref 4.0–10.5)
nRBC: 1.5 % — ABNORMAL HIGH (ref 0.0–0.2)

## 2022-12-30 LAB — PHOSPHORUS: Phosphorus: 3.1 mg/dL (ref 2.5–4.6)

## 2022-12-30 LAB — MAGNESIUM: Magnesium: 2.3 mg/dL (ref 1.7–2.4)

## 2022-12-30 LAB — URIC ACID: Uric Acid, Serum: 9.6 mg/dL — ABNORMAL HIGH (ref 3.7–8.6)

## 2022-12-30 MED ORDER — SENNOSIDES-DOCUSATE SODIUM 8.6-50 MG PO TABS
2.0000 | ORAL_TABLET | Freq: Every evening | ORAL | Status: DC | PRN
  Administered 2023-01-01: 2 via ORAL
  Filled 2022-12-30: qty 2

## 2022-12-30 MED ORDER — VINCRISTINE SULFATE CHEMO INJECTION 1 MG/ML
Freq: Once | INTRAVENOUS | Status: AC
  Filled 2022-12-30: qty 13

## 2022-12-30 MED ORDER — ALUM & MAG HYDROXIDE-SIMETH 200-200-20 MG/5ML PO SUSP
60.0000 mL | ORAL | Status: DC | PRN
  Administered 2022-12-30 – 2023-01-01 (×2): 60 mL via ORAL
  Filled 2022-12-30 (×2): qty 60

## 2022-12-30 MED ORDER — ACETAMINOPHEN 325 MG PO TABS
325.0000 mg | ORAL_TABLET | Freq: Three times a day (TID) | ORAL | Status: DC | PRN
  Administered 2022-12-31 – 2023-01-03 (×3): 650 mg via ORAL
  Filled 2022-12-30 (×3): qty 2

## 2022-12-30 MED ORDER — BICTEGRAVIR-EMTRICITAB-TENOFOV 50-200-25 MG PO TABS
1.0000 | ORAL_TABLET | Freq: Every day | ORAL | Status: DC
  Administered 2022-12-30 – 2023-01-03 (×5): 1 via ORAL
  Filled 2022-12-30 (×5): qty 1

## 2022-12-30 MED ORDER — COLD PACK MISC ONCOLOGY: 1.0000 | Freq: Once | Status: DC | PRN

## 2022-12-30 MED ORDER — POLYETHYLENE GLYCOL 3350 17 G PO PACK
17.0000 g | PACK | Freq: Every day | ORAL | Status: DC
  Administered 2023-01-01: 17 g via ORAL
  Filled 2022-12-30 (×4): qty 1

## 2022-12-30 MED ORDER — SULFAMETHOXAZOLE-TRIMETHOPRIM 800-160 MG PO TABS
1.0000 | ORAL_TABLET | Freq: Every day | ORAL | Status: DC
  Administered 2022-12-30 – 2023-01-03 (×5): 1 via ORAL
  Filled 2022-12-30 (×5): qty 1

## 2022-12-30 MED ORDER — SODIUM CHLORIDE 0.9 % IV SOLN
Freq: Once | INTRAVENOUS | Status: AC
  Administered 2022-12-30: 8 mg via INTRAVENOUS
  Filled 2022-12-30: qty 4

## 2022-12-30 MED ORDER — HYDROCORTISONE (PERIANAL) 2.5 % EX CREA: 1.0000 | TOPICAL_CREAM | Freq: Two times a day (BID) | CUTANEOUS | Status: DC | PRN

## 2022-12-30 MED ORDER — ENOXAPARIN SODIUM 60 MG/0.6ML IJ SOSY
60.0000 mg | PREFILLED_SYRINGE | INTRAMUSCULAR | Status: DC
  Administered 2022-12-30 – 2023-01-02 (×4): 60 mg via SUBCUTANEOUS
  Filled 2022-12-30 (×4): qty 0.6

## 2022-12-30 MED ORDER — GABAPENTIN 300 MG PO CAPS: 300.0000 mg | ORAL_CAPSULE | Freq: Every day | ORAL | Status: DC

## 2022-12-30 MED ORDER — SODIUM CHLORIDE 0.9 % IV SOLN: INTRAVENOUS | Status: DC

## 2022-12-30 MED ORDER — HOT PACK MISC ONCOLOGY: 1.0000 | Freq: Once | Status: DC | PRN

## 2022-12-30 NOTE — H&P (Addendum)
[facility] [facility] History and physical note  Date of service.- 12/30/22   Chief complaint -admission for cycle 3 of dose adjusted EPOCH-R chemotherapy for recently diagnosed Burkitt's lymphoma  HISTORY OF PRESENTING ILLNESS:  Logan Ward is a wonderful 42 y.o. male with a history of HIV/AIDS on Biktarvy who is being admitted for cycle 3 of inpatient dose adjusted EPOCH-R chemotherapy. Was recently seen in clinic for toxicity check and has tolerated his second cycle of treatment well with no acute toxicities. He notes that his back pain has not completely resolved and he is having no further headaches and so his gabapentin was discontinued. Patient's wife and mother are at bedside. Notes that he would like to hold off on intrathecal methotrexate CNS prophylaxis with his third cycle of treatment at this time likely in the evening to discuss this further when we consider it with subsequent cycles. He has been compliant with his HAART treatment, OI prophylaxis infection issues. Patient notes no acute new symptoms since his last clinic visit.  Headaches have resolved.  No other acute new focal neurological symptoms. No fevers no chills no night sweats no new lumps or bumps. Good p.o. intake and hydration. Questions regarding chemotherapy plan and subsequent treatments were discussed in details.   MEDICAL HISTORY: Past Medical History:  Diagnosis Date   Anal fissure    HIV infection ([place])    Internal hemorrhoids    Obesity    Rectal ulcer     ALLERGIES:  has No Known Allergies.  MEDICATIONS:  Current Outpatient Medications  Medication Sig Dispense Refill   gabapentin (NEURONTIN) 100 MG capsule Take 2 capsules (200 mg total) by mouth at bedtime. May increase to '300mg'$  (3 capsules) at bedtime if still having back pain/leg cramps after 3-4 days. 60 capsule 0   bictegravir-emtricitabine-tenofovir AF (BIKTARVY) 50-200-25 MG TABS tablet Take 1 tablet by mouth daily. 30 tablet 5    dexamethasone (DECADRON) 4 MG tablet Take 1 tablet (4 mg total) by mouth 2 (two) times daily with breakfast and lunch for 2 days after completion of chemotherapy 30 tablet 0   lidocaine-prilocaine (EMLA) cream Apply to affected area once (Patient taking differently: Apply 1 Application topically once.) 30 g 3   ondansetron (ZOFRAN) 8 MG tablet Take 1 tablet (8 mg total) by mouth every 8 (eight) hours as needed for nausea. 30 tablet 3   polyethylene glycol (MIRALAX / GLYCOLAX) 17 g packet Take 1 packet (17 g) by mouth daily. (Patient not taking: Reported on 12/10/2022) 14 each 0   prochlorperazine (COMPAZINE) 10 MG tablet Take 1 tablet (10 mg total) by mouth every 6 (six) hours as needed. (Patient not taking: Reported on 12/19/2022) 30 tablet 2   senna-docusate (SENOKOT-S) 8.6-50 MG tablet Take 2 tablets by mouth at bedtime as needed for mild constipation. (Patient not taking: Reported on 12/19/2022) 60 tablet 1   sulfamethoxazole-trimethoprim (BACTRIM DS) 800-160 MG tablet Take 1 tablet by mouth daily. 30 tablet 5   TYLENOL 325 MG tablet Take 325-650 mg by mouth every 8 (eight) hours as needed for mild pain or headache. (Patient not taking: Reported on 12/19/2022)     No current facility-administered medications for this visit.    SURGICAL HISTORY:  Past Surgical History:  Procedure Laterality Date   IR IMAGING GUIDED PORT INSERTION  11/18/2022    REVIEW OF SYSTEMS:   .10 Point review of Systems was done is negative except as noted above.  PHYSICAL EXAMINATION:  Blood pressure 125/84, pulse [name])  107, temperature 98.1 F (36.7 C), resp. rate 20, weight 273 lb 12.8 oz (124.2 kg), SpO2 100 %.  ECOG PERFORMANCE STATUS: 1 . GENERAL:alert, in no acute distress and comfortable SKIN: no acute rashes, no significant lesions EYES: conjunctiva are pink and non-injected, sclera anicteric OROPHARYNX: MMM, no exudates, no oropharyngeal erythema or ulceration NECK: supple, no JVD LYMPH:  no palpable  lymphadenopathy in the cervical, axillary or inguinal regions LUNGS: clear to auscultation b/l with normal respiratory effort HEART: regular rate & rhythm ABDOMEN:  normoactive bowel sounds , non tender, not distended. Extremity: no pedal edema PSYCH: alert & oriented x 3 with fluent speech NEURO: no focal motor/sensory deficits   LABORATORY DATA:  .    Latest Ref Rng & Units 12/30/2022    9:57 AM 12/23/2022   12:16 PM 12/14/2022    5:00 AM  CBC  WBC 4.0 - 10.5 K/uL 3.3  11.6  3.0   Hemoglobin 13.0 - 17.0 g/dL 8.9  11.2  10.4   Hematocrit 39.0 - 52.0 % 29.4  34.1  32.9   Platelets 150 - 400 K/uL 179  71  262       Latest Ref Rng & Units 01/01/2023    5:12 AM 12/31/2022    5:50 AM 12/30/2022    9:57 AM  CMP  Glucose 70 - 99 mg/dL 119  117  112   BUN 6 - 20 mg/dL '11  10  8   '$ Creatinine 0.61 - 1.24 mg/dL 0.95  1.00  1.07   Sodium 135 - 145 mmol/L 136  136  140   Potassium 3.5 - 5.1 mmol/L 4.3  4.2  3.7   Chloride 98 - 111 mmol/L 103  104  106   CO2 22 - 32 mmol/L '24  24  26   '$ Calcium 8.9 - 10.3 mg/dL 8.8  8.9  8.7   Total Protein 6.5 - 8.1 g/dL 6.4  6.4  6.4   Total Bilirubin 0.3 - 1.2 mg/dL 1.1  0.7  0.6   Alkaline Phos 38 - 126 U/L 42  50  50   AST 15 - 41 U/L '23  20  22   '$ ALT 0 - 44 U/L '18  19  20    '$ Uric acid 9.6   RADIOGRAPHIC STUDIES:  DG FL GUIDED LUMBAR PUNCTURE  Result Date: 12/12/2022 CLINICAL DATA:  Burkitt's lymphoma.  HIV positive. EXAM: FLUOROSCOPICALLY GUIDED LUMBAR PUNCTURE FOR INTRATHECAL CHEMOTHERAPY FLUOROSCOPY: Radiation Exposure Index (as provided by the fluoroscopic device): 13.3 mGy Kerma PROCEDURE: Informed consent was obtained from the patient prior to the procedure, including potential complications of headache, allergy, and pain. With the patient prone, the lower back was prepped with Betadine. 1% Lidocaine was used for local anesthesia. Lumbar puncture was performed at the L3-4 level using a gauge needle with return of clear colorlessCSF. CSF  sample delivered to the laboratory. Pharmacy prepared methotrexate was injected into the subarachnoid space. The patient tolerated the procedure well without apparent complication. IMPRESSION: Fluoroscopically guided lumbar puncture for intrathecal chemotherapy administration. Successful lumbar puncture and injection of intrathecal methotrexate. Electronically Signed   By: Franchot Gallo M.D.   On: 12/12/2022 11:03   DG FLUORO GUIDED LOC OF NEEDLE/CATH TIP FOR SPINAL INJECT LT  Result Date: 12/12/2022 CLINICAL DATA:  Burkitt's lymphoma.  HIV positive. EXAM: FLUOROSCOPICALLY GUIDED LUMBAR PUNCTURE FOR INTRATHECAL CHEMOTHERAPY FLUOROSCOPY: Radiation Exposure Index (as provided by the fluoroscopic device): 13.3 mGy Kerma PROCEDURE: Informed consent was obtained from the patient prior to  the procedure, including potential complications of headache, allergy, and pain. With the patient prone, the lower back was prepped with Betadine. 1% Lidocaine was used for local anesthesia. Lumbar puncture was performed at the L3-4 level using a gauge needle with return of clear colorlessCSF. CSF sample delivered to the laboratory. Pharmacy prepared methotrexate was injected into the subarachnoid space. The patient tolerated the procedure well without apparent complication. IMPRESSION: Fluoroscopically guided lumbar puncture for intrathecal chemotherapy administration. Successful lumbar puncture and injection of intrathecal methotrexate. Electronically Signed   By: Franchot Gallo M.D.   On: 12/12/2022 11:03     ASSESSMENT/PLAN:   42 year old male with HIV/AIDS with recently diagnosed Burkitt's lymphoma    #1  Recently diagnosed Burkitt's lymphoma stage II.  No CNS involvement. PET CT scan with right cervical and right lower neck mass and axillary lymphadenopathy .  No evidence of involvement below the diaphragm. No CNS involvement on lumbar puncture or by clinical symptoms. Status post cycle 2 of daEPOCH-R with no  significant toxicities. Had a mild spinal headache after his previous intrathecal methotrexate prophylaxis.   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  Patient reports this was completely treated.   #4  Status post tumor lysis syndrome completed allopurinol for 1 month.  Patient had limited tumor burden most of which has resolved. Currently holding allopurinol from cycle 2 and monitoring closely   #5 normocytic anemia related to chemotherapy.  #6 thrombocytopenia now resolved PLAN: -H&P was performed in detail.  Patient notes no acute new issues since his last clinic visit and feels ready for cycle 3 of inpatient [place] done today were reviewed in detail with the patient and are stable to proceed with cycle 3 of dose adjusted EPOCH with same dosing and cycle 2.  We will hold off on additional dose escalation at this time. -We shall monitor his anemia since his hemoglobin has dropped a little bit to 8.9. -Uric acid levels slightly elevated but improved and he was recommended to continue good hydration.  Will monitor off allopurinol. -Continue Biktarvy and Bactrim. -Daily CBC CMP and uric acid -Patient declines intrathecal methotrexate for CNS prophylaxis dose #3 with this cycle.  He wants to think about this further and will determine if he shall get additional doses with subsequent cycles. -Lovenox for VTE prophylaxis -Oncology will continue to follow -Patient's and his family's (mother and wife at bedside) questions were answered in details. -Care should be taken not to share his HIV/AIDS diagnosis with his family as per patient's request. -He shall receive Rituxan Udenyca/Neulasta as outpatient on 01/06/2023  .The total time spent in the appointment was 80 minutes* .  All of the patient's questions were answered with apparent satisfaction. The patient knows to call the clinic with any problems, questions or  concerns.   Sullivan Lone MD MS AAHIVMS [facility] Hematology/Oncology Physician [facility]  .*Total Encounter Time as defined by the Centers for Medicare and Medicaid Services includes, in addition to the face-to-face time of a patient visit (documented in the note above) non-face-to-face time: obtaining and reviewing outside history, ordering and reviewing medications, tests or procedures, care coordination (communications with other health care professionals or caregivers) and documentation in the medical record.   All of the patient's questions were answered with apparent satisfaction. The patient knows to call the clinic with any problems, questions or concerns.   Sullivan Lone MD MS AAHIVMS [place]  Hematology/Oncology Physician [facility]  .*Total Encounter Time as defined by the Centers for Medicare and Medicaid Services includes, in addition to the face-to-face time of a patient visit (documented in the note above) non-face-to-face time: obtaining and reviewing outside history, ordering and reviewing medications, tests or procedures, care coordination (communications with other health care professionals or caregivers) and documentation in the medical record.   I, Cleda Mccreedy, am acting as a Education administrator for Sullivan Lone, MD.  .I have reviewed the above documentation for accuracy and completeness, and I agree with the above. [name]

## 2022-12-31 ENCOUNTER — Encounter: Payer: Self-pay | Admitting: Hematology

## 2022-12-31 DIAGNOSIS — C8371 Burkitt lymphoma, lymph nodes of head, face, and neck: Secondary | ICD-10-CM

## 2022-12-31 DIAGNOSIS — Z5111 Encounter for antineoplastic chemotherapy: Secondary | ICD-10-CM | POA: Diagnosis not present

## 2022-12-31 LAB — COMPREHENSIVE METABOLIC PANEL
ALT: 19 U/L (ref 0–44)
AST: 20 U/L (ref 15–41)
Albumin: 3.5 g/dL (ref 3.5–5.0)
Alkaline Phosphatase: 50 U/L (ref 38–126)
Anion gap: 8 (ref 5–15)
BUN: 10 mg/dL (ref 6–20)
CO2: 24 mmol/L (ref 22–32)
Calcium: 8.9 mg/dL (ref 8.9–10.3)
Chloride: 104 mmol/L (ref 98–111)
Creatinine, Ser: 1 mg/dL (ref 0.61–1.24)
GFR, Estimated: >60 mL/min (ref >60)
Glucose, Bld: 117 mg/dL — ABNORMAL HIGH (ref 70–99)
Potassium: 4.2 mmol/L (ref 3.5–5.1)
Sodium: 136 mmol/L (ref 135–145)
Total Bilirubin: 0.7 mg/dL (ref 0.3–1.2)
Total Protein: 6.4 g/dL — ABNORMAL LOW (ref 6.5–8.1)

## 2022-12-31 MED ORDER — VINCRISTINE SULFATE CHEMO INJECTION 1 MG/ML
Freq: Once | INTRAVENOUS | Status: AC
  Filled 2022-12-31: qty 13

## 2022-12-31 MED ORDER — CHLORHEXIDINE GLUCONATE CLOTH 2 % EX PADS
6.0000 | MEDICATED_PAD | Freq: Every day | CUTANEOUS | Status: DC
  Administered 2022-12-31 – 2023-01-03 (×4): 6 via TOPICAL

## 2022-12-31 MED ORDER — SODIUM CHLORIDE 0.9 % IV SOLN
Freq: Once | INTRAVENOUS | Status: AC
  Administered 2022-12-31: 8 mg via INTRAVENOUS
  Filled 2022-12-31: qty 4

## 2023-01-01 ENCOUNTER — Encounter: Payer: Self-pay | Admitting: Hematology

## 2023-01-01 ENCOUNTER — Other Ambulatory Visit: Payer: Self-pay | Admitting: Hematology

## 2023-01-01 DIAGNOSIS — Z5111 Encounter for antineoplastic chemotherapy: Secondary | ICD-10-CM | POA: Diagnosis not present

## 2023-01-01 DIAGNOSIS — C8378 Burkitt lymphoma, lymph nodes of multiple sites: Secondary | ICD-10-CM

## 2023-01-01 DIAGNOSIS — D649 Anemia, unspecified: Secondary | ICD-10-CM

## 2023-01-01 DIAGNOSIS — C8371 Burkitt lymphoma, lymph nodes of head, face, and neck: Secondary | ICD-10-CM | POA: Diagnosis not present

## 2023-01-01 LAB — CBC WITH DIFFERENTIAL/PLATELET
Abs Immature Granulocytes: 0.03 10*3/uL (ref 0.00–0.07)
Basophils Absolute: 0 10*3/uL (ref 0.0–0.1)
Basophils Relative: 0 %
Eosinophils Absolute: 0 10*3/uL (ref 0.0–0.5)
Eosinophils Relative: 0 %
HCT: 28.9 % — ABNORMAL LOW (ref 39.0–52.0)
Hemoglobin: 9.1 g/dL — ABNORMAL LOW (ref 13.0–17.0)
Immature Granulocytes: 1 %
Lymphocytes Relative: 7 %
Lymphs Abs: 0.4 10*3/uL — ABNORMAL LOW (ref 0.7–4.0)
MCH: 27.7 pg (ref 26.0–34.0)
MCHC: 31.5 g/dL (ref 30.0–36.0)
MCV: 87.8 fL (ref 80.0–100.0)
Monocytes Absolute: 0.2 10*3/uL (ref 0.1–1.0)
Monocytes Relative: 4 %
Neutro Abs: 4.8 10*3/uL (ref 1.7–7.7)
Neutrophils Relative %: 88 %
Platelets: 284 10*3/uL (ref 150–400)
RBC: 3.29 MIL/uL — ABNORMAL LOW (ref 4.22–5.81)
RDW: 18.7 % — ABNORMAL HIGH (ref 11.5–15.5)
WBC: 5.5 10*3/uL (ref 4.0–10.5)
nRBC: 0.4 % — ABNORMAL HIGH (ref 0.0–0.2)

## 2023-01-01 LAB — MAGNESIUM: Magnesium: 2.2 mg/dL (ref 1.7–2.4)

## 2023-01-01 LAB — COMPREHENSIVE METABOLIC PANEL
ALT: 18 U/L (ref 0–44)
AST: 23 U/L (ref 15–41)
Albumin: 3.6 g/dL (ref 3.5–5.0)
Alkaline Phosphatase: 42 U/L (ref 38–126)
Anion gap: 9 (ref 5–15)
BUN: 11 mg/dL (ref 6–20)
CO2: 24 mmol/L (ref 22–32)
Calcium: 8.8 mg/dL — ABNORMAL LOW (ref 8.9–10.3)
Chloride: 103 mmol/L (ref 98–111)
Creatinine, Ser: 0.95 mg/dL (ref 0.61–1.24)
GFR, Estimated: >60 mL/min (ref >60)
Glucose, Bld: 119 mg/dL — ABNORMAL HIGH (ref 70–99)
Potassium: 4.3 mmol/L (ref 3.5–5.1)
Sodium: 136 mmol/L (ref 135–145)
Total Bilirubin: 1.1 mg/dL (ref 0.3–1.2)
Total Protein: 6.4 g/dL — ABNORMAL LOW (ref 6.5–8.1)

## 2023-01-01 LAB — URIC ACID: Uric Acid, Serum: 7.1 mg/dL (ref 3.7–8.6)

## 2023-01-01 LAB — TYPE AND SCREEN
ABO/RH(D): AB POS
Antibody Screen: NEGATIVE

## 2023-01-01 MED ORDER — SODIUM CHLORIDE 0.9 % IV SOLN
Freq: Once | INTRAVENOUS | Status: AC
  Administered 2023-01-01: 18 mg via INTRAVENOUS
  Filled 2023-01-01: qty 4

## 2023-01-01 MED ORDER — VINCRISTINE SULFATE CHEMO INJECTION 1 MG/ML
Freq: Once | INTRAVENOUS | Status: AC
  Filled 2023-01-01: qty 13

## 2023-01-01 NOTE — TOC Initial Note (Signed)
Transition of Care [facility]) - Initial/Assessment Note    Patient Details  Name: Logan Ward MRN: [id number] Date of Birth: 1981/11/08  Transition of Care [facility]) CM/SW Contact:    Angelita Ingles, RN Phone Number:[phone]  01/01/2023, 8:36 AM  Clinical Narrative:                  Transition of Care [facility]) Screening Note   Patient Details  Name: Logan Ward Date of Birth: 1981-07-15   Transition of Care [facility]) CM/SW Contact:    Angelita Ingles, RN Phone Number: 01/01/2023, 8:37 AM    Transition of Care Department [facility]) has reviewed patient and no TOC needs have been identified at this time. We will continue to monitor patient advancement through interdisciplinary progression rounds. If new patient transition needs arise, please place a TOC consult.          Patient Goals and CMS Choice            Expected Discharge Plan and Services                                              Prior Living Arrangements/Services                       Activities of Daily Living Home Assistive Devices/Equipment: None ADL Screening (condition at time of admission) Patient's cognitive ability adequate to safely complete daily activities?: Yes Is the patient deaf or have difficulty hearing?: No Does the patient have difficulty seeing, even when wearing glasses/contacts?: No Does the patient have difficulty concentrating, remembering, or making decisions?: Yes Patient able to express need for assistance with ADLs?: Yes Does the patient have difficulty dressing or bathing?: No Independently performs ADLs?: Yes (appropriate for developmental age) Does the patient have difficulty walking or climbing stairs?: No Weakness of Legs: None Weakness of Arms/Hands: None  Permission Sought/Granted                  Emotional Assessment              Admission diagnosis:  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[place]) [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] Patient Active Problem List   Diagnosis Date Noted    Healthcare maintenance 12/19/2022   Encounter for chemotherapy management 12/12/2022   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[place]) 12/10/2022   Decreased appetite 12/02/2022   Drug-induced constipation 11/21/2022   Nausea without vomiting 11/21/2022   Acute nonintractable headache 11/21/2022   Encounter for antineoplastic chemotherapy 11/18/2022   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 [address] [address]Phone: [phone]: [phone] [facility]  [address] [place] [address] Phone: [phone] Fax: [phone]     Social Determinants of Health (SDOH) Social History: [place]: No Food Insecurity (12/30/2022)  Housing: Low Risk  (12/30/2022)  Transportation Needs: No Transportation Needs (12/30/2022)  Utilities: Not At Risk (12/30/2022)  Alcohol Screen: Low Risk  (10/24/2022)  Depression (PHQ2-9): Low Risk  (12/19/2022)  Financial Resource Strain: Low Risk  (10/24/2022)  Physical Activity: Sufficiently Active (10/24/2022)  Recent Concern: Physical Activity - Insufficiently Active (10/24/2022)  Social Connections: Unknown (10/24/2022)   Stress: No Stress Concern Present (10/24/2022)  Tobacco Use: Low Risk  (12/30/2022)   SDOH Interventions:     Readmission Risk Interventions     No data to display

## 2023-01-01 NOTE — Progress Notes (Signed)
PET CT ordered.

## 2023-01-01 NOTE — Progress Notes (Signed)
[name]  HEMATOLOGY/ONCOLOGY INPATIENT PROGRESS NOTE  Date of Service: Inpatient Attending: .Brunetta Genera, MD   SUBJECTIVE  .Logan Ward  was seen in oncology follow-up with cycle 3-day 2 of chemotherapy today.  He notes no headaches no back pain.  Mild grade 1 nausea controlled with his nausea medications.  No acute new symptoms.  Tolerating his chemotherapy okay.    OBJECTIVE:  NAD  PHYSICAL EXAMINATION: . Vitals:   12/31/22 0555 12/31/22 1435 12/31/22 2033 01/01/23 0218  BP: 112/67 (!) 106/56 128/80 106/68  Pulse: 76 66 79 (!) 58  Resp: '20 18 18 16  '$ Temp: 98 F (36.7 C) 98 F (36.7 C) 98.4 F (36.9 C) 98.3 F (36.8 C)  TempSrc: Oral Oral Oral Oral  SpO2: 99%  100% 100%  Weight:      Height:       Filed Weights   12/30/22 2142  Weight: 275 lb 5.7 oz (124.9 kg)   .Body mass index is 36.33 kg/m.  [name] GENERAL:alert, in no acute distress and comfortable SKIN: no acute rashes, no significant lesions EYES: conjunctiva are pink and non-injected, sclera anicteric OROPHARYNX: MMM, no exudates, no oropharyngeal erythema or ulceration NECK: supple, no JVD LYMPH:  no palpable lymphadenopathy in the cervical, axillary or inguinal regions LUNGS: clear to auscultation b/l with normal respiratory effort HEART: regular rate & rhythm ABDOMEN:  normoactive bowel sounds , non tender, not distended. Extremity: no pedal edema PSYCH: alert & oriented x 3 with fluent speech NEURO: no focal motor/sensory deficits   MEDICAL HISTORY:  Past Medical History:  Diagnosis Date   Anal fissure    HIV infection ([place])    Internal hemorrhoids    Obesity    Rectal ulcer     SURGICAL HISTORY: Past Surgical History:  Procedure Laterality Date   IR IMAGING GUIDED PORT INSERTION  11/18/2022    SOCIAL HISTORY: Social History   Socioeconomic History   Marital status: Single    Spouse name: Not on file   Number of children: Not on file   Years of education: Not on file    Highest education level: Not on file  Occupational History   Not on file  Tobacco Use   Smoking status: Never   Smokeless tobacco: Never  Vaping Use   Vaping Use: Never used  Substance and Sexual Activity   Alcohol use: No   Drug use: No   Sexual activity: Yes    Comment: declined condoms  Other Topics Concern   Not on file  Social History Narrative   Not on file   Social Determinants of Health   Financial Resource Strain: Low Risk  (10/24/2022)   Overall Financial Resource Strain (CARDIA)    Difficulty of Paying Living Expenses: Not hard at all  Food Insecurity: No Food Insecurity (12/30/2022)   Hunger Vital Sign    Worried About Running Out of Food in the Last Year: Never true    Ran Out of Food in the Last Year: Never true  Transportation Needs: No Transportation Needs (12/30/2022)   PRAPARE - Hydrologist (Medical): No    Lack of Transportation (Non-Medical): No  Physical Activity: Sufficiently Active (10/24/2022)   Exercise Vital Sign    Days of Exercise per Week: 3 days    Minutes of Exercise per Session: 60 min  Recent Concern: Physical Activity - Insufficiently Active (10/24/2022)   Exercise Vital Sign    Days of Exercise per Week: 3 days  Minutes of Exercise per Session: 20 min  Stress: No Stress Concern Present (10/24/2022)   [place]    Feeling of Stress : Not at all  Social Connections: Unknown (10/24/2022)   Social Connection and Isolation Panel [NHANES]    Frequency of Communication with Friends and Family: Twice a week    Frequency of Social Gatherings with Friends and Family: Twice a week    Attends Religious Services: 1 to 4 times per year    Active Member of [employer] or Organizations: No    Attends Music therapist: 1 to 4 times per year    Marital Status: Patient refused  Intimate Partner Violence: Not At Risk (12/30/2022)   Humiliation, Afraid,  Rape, and Kick questionnaire    Fear of Current or Ex-Partner: No    Emotionally Abused: No    Physically Abused: No    Sexually Abused: No    FAMILY HISTORY: Family History  Problem Relation Age of Onset   Hypertension Father    Throat cancer Father    Heart attack Brother    Colon cancer Neg Hx    Esophageal cancer Neg Hx    Stomach cancer Neg Hx     ALLERGIES:  has No Known Allergies.  MEDICATIONS:  Scheduled Meds:  bictegravir-emtricitabine-tenofovir AF  1 tablet Oral Daily   Chlorhexidine Gluconate Cloth  6 each Topical Daily   DOXOrubicin (ADRIAMYCIN) 26 mg, etoposide (VEPESID) 128 mg, vinCRIStine (ONCOVIN) 1 mg in sodium chloride 0.9 % 1,000 mL chemo infusion   Intravenous Once   DOXOrubicin (ADRIAMYCIN) 26 mg, etoposide (VEPESID) 128 mg, vinCRIStine (ONCOVIN) 1 mg in sodium chloride 0.9 % 1,000 mL chemo infusion   Intravenous Once   enoxaparin (LOVENOX) injection  60 mg Subcutaneous Q24H   polyethylene glycol  17 g Oral Daily   sulfamethoxazole-trimethoprim  1 tablet Oral Daily   Continuous Infusions:  sodium chloride 10 mL/hr at 01/01/23 0638   ondansetron (ZOFRAN) 8 mg, dexamethasone (DECADRON) 10 mg in sodium chloride 0.9 % 50 mL IVPB     PRN Meds:.acetaminophen, alum & mag hydroxide-simeth, Cold Pack, Hot Pack, hydrocortisone, senna-docusate  REVIEW OF SYSTEMS:    10 Point review of Systems was done is negative except as noted above.   LABORATORY DATA:  I have reviewed the data as listed  .    Latest Ref Rng & Units 01/01/2023    1:32 PM 12/30/2022    9:57 AM 12/23/2022   12:16 PM  CBC  WBC 4.0 - 10.5 K/uL 5.5  3.3  11.6   Hemoglobin 13.0 - 17.0 g/dL 9.1  8.9  11.2   Hematocrit 39.0 - 52.0 % 28.9  29.4  34.1   Platelets 150 - 400 K/uL 284  179  71     .    Latest Ref Rng & Units 01/01/2023    5:12 AM 12/31/2022    5:50 AM 12/30/2022    9:57 AM  CMP  Glucose 70 - 99 mg/dL 119  117  112   BUN 6 - 20 mg/dL '11  10  8   '$ Creatinine 0.61 - 1.24 mg/dL  0.95  1.00  1.07   Sodium 135 - 145 mmol/L 136  136  140   Potassium 3.5 - 5.1 mmol/L 4.3  4.2  3.7   Chloride 98 - 111 mmol/L 103  104  106   CO2 22 - 32 mmol/L '24  24  26   '$ Calcium  8.9 - 10.3 mg/dL 8.8  8.9  8.7   Total Protein 6.5 - 8.1 g/dL 6.4  6.4  6.4   Total Bilirubin 0.3 - 1.2 mg/dL 1.1  0.7  0.6   Alkaline Phos 38 - 126 U/L 42  50  50   AST 15 - 41 U/L '23  20  22   '$ ALT 0 - 44 U/L '18  19  20      '$ RADIOGRAPHIC STUDIES: I have personally reviewed the radiological images as listed and agreed with the findings in the report. DG FL GUIDED LUMBAR PUNCTURE  Result Date: 12/12/2022 CLINICAL DATA:  Burkitt's lymphoma.  HIV positive. EXAM: FLUOROSCOPICALLY GUIDED LUMBAR PUNCTURE FOR INTRATHECAL CHEMOTHERAPY FLUOROSCOPY: Radiation Exposure Index (as provided by the fluoroscopic device): 13.3 mGy Kerma PROCEDURE: Informed consent was obtained from the patient prior to the procedure, including potential complications of headache, allergy, and pain. With the patient prone, the lower back was prepped with Betadine. 1% Lidocaine was used for local anesthesia. Lumbar puncture was performed at the L3-4 level using a gauge needle with return of clear colorlessCSF. CSF sample delivered to the laboratory. Pharmacy prepared methotrexate was injected into the subarachnoid space. The patient tolerated the procedure well without apparent complication. IMPRESSION: Fluoroscopically guided lumbar puncture for intrathecal chemotherapy administration. Successful lumbar puncture and injection of intrathecal methotrexate. Electronically Signed   By: Franchot Gallo M.D.   On: 12/12/2022 11:03   DG FLUORO GUIDED LOC OF NEEDLE/CATH TIP FOR SPINAL INJECT LT  Result Date: 12/12/2022 CLINICAL DATA:  Burkitt's lymphoma.  HIV positive. EXAM: FLUOROSCOPICALLY GUIDED LUMBAR PUNCTURE FOR INTRATHECAL CHEMOTHERAPY FLUOROSCOPY: Radiation Exposure Index (as provided by the fluoroscopic device): 13.3 mGy Kerma PROCEDURE: Informed  consent was obtained from the patient prior to the procedure, including potential complications of headache, allergy, and pain. With the patient prone, the lower back was prepped with Betadine. 1% Lidocaine was used for local anesthesia. Lumbar puncture was performed at the L3-4 level using a gauge needle with return of clear colorlessCSF. CSF sample delivered to the laboratory. Pharmacy prepared methotrexate was injected into the subarachnoid space. The patient tolerated the procedure well without apparent complication. IMPRESSION: Fluoroscopically guided lumbar puncture for intrathecal chemotherapy administration. Successful lumbar puncture and injection of intrathecal methotrexate. Electronically Signed   By: Franchot Gallo M.D.   On: 12/12/2022 11:03    ASSESSMENT & PLAN:    42 year old male with HIV/AIDS with recently diagnosed Burkitt's lymphoma     #1  Recently diagnosed Burkitt's lymphoma stage II.  No CNS involvement. PET CT scan with right cervical and right lower neck mass and axillary lymphadenopathy .  No evidence of involvement below the diaphragm. No CNS involvement on lumbar puncture or by clinical symptoms. Status post cycle 2 of daEPOCH-R with no significant toxicities. Had a mild spinal headache after his previous intrathecal methotrexate prophylaxis.   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  Patient reports this was completely treated.   #4  Status post tumor lysis syndrome completed allopurinol for 1 month.  Patient had limited tumor burden most of which has resolved. Currently holding allopurinol from cycle 2 and monitoring closely   #5 normocytic anemia related to chemotherapy.     #6 thrombocytopenia now resolved PLAN: Labs today were discussed in detail with the patient. No acute new toxicities and patient is appropriate to proceed with cycle 3-day 2 of treatment today. -Will recheck blood counts tomorrow to  monitor his anemia -Uric acid levels slightly elevated but improved and he was recommended to continue good hydration.  Will monitor off allopurinol. -Continue Biktarvy and Bactrim. -Daily CBC CMP -Lovenox for VTE prophylaxis -Oncology will continue to follow -He shall receive Rituxan Udenyca/Neulasta as outpatient on 01/06/2023  The total time spent in the appointment was 25 minutes*.  All of the patient's questions were answered with apparent satisfaction. The patient knows to call the clinic with any problems, questions or concerns.   Sullivan Lone MD MS AAHIVMS [facility] Hematology/Oncology Physician [facility]  .*Total Encounter Time as defined by the Centers for Medicare and Medicaid Services includes, in addition to the face-to-face time of a patient visit (documented in the note above) non-face-to-face time: obtaining and reviewing outside history, ordering and reviewing medications, tests or procedures, care coordination (communications with other health care professionals or caregivers) and documentation in the medical record.

## 2023-01-01 NOTE — Progress Notes (Signed)
[name]  HEMATOLOGY/ONCOLOGY INPATIENT PROGRESS NOTE  Date of Service:01/01/2023  Inpatient Attending: .Brunetta Genera, MD   SUBJECTIVE  .Logan Ward was seen in medical oncology follow-up today with cycle 3-day 3 of dose adjusted EPOCH-R chemotherapy. He notes some constipation concerns with heartburns. No uncontrolled nausea or vomiting. No new tingling or numbness in his hands or feet. No other acute new symptoms. Patient's mother at bedside.   OBJECTIVE:  NAD  PHYSICAL EXAMINATION: . Vitals:   12/31/22 1435 12/31/22 2033 01/01/23 0218 01/01/23 1358  BP: (!) 106/56 128/80 106/68 129/82  Pulse: 66 79 (!) 58 68  Resp: '18 18 16 20  '$ Temp: 98 F (36.7 C) 98.4 F (36.9 C) 98.3 F (36.8 C) 98.3 F (36.8 C)  TempSrc: Oral Oral Oral Oral  SpO2:  100% 100% 98%  Weight:      Height:       Filed Weights   12/30/22 2142  Weight: 275 lb 5.7 oz (124.9 kg)   .Body mass index is 36.33 kg/m. [name] GENERAL:alert, in no acute distress and comfortable SKIN: no acute rashes, no significant lesions EYES: conjunctiva are pink and non-injected, sclera anicteric OROPHARYNX: MMM, no exudates, no oropharyngeal erythema or ulceration NECK: supple, no JVD LYMPH:  no palpable lymphadenopathy in the cervical, axillary or inguinal regions LUNGS: clear to auscultation b/l with normal respiratory effort HEART: regular rate & rhythm ABDOMEN:  normoactive bowel sounds , non tender, not distended. Extremity: no pedal edema PSYCH: alert & oriented x 3 with fluent speech NEURO: no focal motor/sensory deficits   MEDICAL HISTORY:  Past Medical History:  Diagnosis Date   Anal fissure    HIV infection ([place])    Internal hemorrhoids    Obesity    Rectal ulcer     SURGICAL HISTORY: Past Surgical History:  Procedure Laterality Date   IR IMAGING GUIDED PORT INSERTION  11/18/2022    SOCIAL HISTORY: Social History   Socioeconomic History   Marital status: Single    Spouse name: Not on  file   Number of children: Not on file   Years of education: Not on file   Highest education level: Not on file  Occupational History   Not on file  Tobacco Use   Smoking status: Never   Smokeless tobacco: Never  Vaping Use   Vaping Use: Never used  Substance and Sexual Activity   Alcohol use: No   Drug use: No   Sexual activity: Yes    Comment: declined condoms  Other Topics Concern   Not on file  Social History Narrative   Not on file   Social Determinants of Health   Financial Resource Strain: Low Risk  (10/24/2022)   Overall Financial Resource Strain (CARDIA)    Difficulty of Paying Living Expenses: Not hard at all  Food Insecurity: No Food Insecurity (12/30/2022)   Hunger Vital Sign    Worried About Running Out of Food in the Last Year: Never true    Ran Out of Food in the Last Year: Never true  Transportation Needs: No Transportation Needs (12/30/2022)   PRAPARE - Hydrologist (Medical): No    Lack of Transportation (Non-Medical): No  Physical Activity: Sufficiently Active (10/24/2022)   Exercise Vital Sign    Days of Exercise per Week: 3 days    Minutes of Exercise per Session: 60 min  Recent Concern: Physical Activity - Insufficiently Active (10/24/2022)   Exercise Vital Sign    Days of Exercise  per Week: 3 days    Minutes of Exercise per Session: 20 min  Stress: No Stress Concern Present (10/24/2022)   [place]    Feeling of Stress : Not at all  Social Connections: Unknown (10/24/2022)   Social Connection and Isolation Panel [NHANES]    Frequency of Communication with Friends and Family: Twice a week    Frequency of Social Gatherings with Friends and Family: Twice a week    Attends Religious Services: 1 to 4 times per year    Active Member of [employer] or Organizations: No    Attends Archivist Meetings: 1 to 4 times per year    Marital Status: Patient refused   Intimate Partner Violence: Not At Risk (12/30/2022)   Humiliation, Afraid, Rape, and Kick questionnaire    Fear of Current or Ex-Partner: No    Emotionally Abused: No    Physically Abused: No    Sexually Abused: No    FAMILY HISTORY: Family History  Problem Relation Age of Onset   Hypertension Father    Throat cancer Father    Heart attack Brother    Colon cancer Neg Hx    Esophageal cancer Neg Hx    Stomach cancer Neg Hx     ALLERGIES:  has No Known Allergies.  MEDICATIONS:  Scheduled Meds:  bictegravir-emtricitabine-tenofovir AF  1 tablet Oral Daily   Chlorhexidine Gluconate Cloth  6 each Topical Daily   DOXOrubicin (ADRIAMYCIN) 26 mg, etoposide (VEPESID) 128 mg, vinCRIStine (ONCOVIN) 1 mg in sodium chloride 0.9 % 1,000 mL chemo infusion   Intravenous Once   enoxaparin (LOVENOX) injection  60 mg Subcutaneous Q24H   polyethylene glycol  17 g Oral Daily   sulfamethoxazole-trimethoprim  1 tablet Oral Daily   Continuous Infusions:  sodium chloride 10 mL/hr at 01/01/23 0638   PRN Meds:.acetaminophen, alum & mag hydroxide-simeth, Cold Pack, Hot Pack, hydrocortisone, senna-docusate  REVIEW OF SYSTEMS:    .10 Point review of Systems was done is negative except as noted above.  LABORATORY DATA:  I have reviewed the data as listed  .    Latest Ref Rng & Units 01/01/2023    1:32 PM 12/30/2022    9:57 AM 12/23/2022   12:16 PM  CBC  WBC 4.0 - 10.5 K/uL 5.5  3.3  11.6   Hemoglobin 13.0 - 17.0 g/dL 9.1  8.9  11.2   Hematocrit 39.0 - 52.0 % 28.9  29.4  34.1   Platelets 150 - 400 K/uL 284  179  71     .    Latest Ref Rng & Units 01/01/2023    5:12 AM 12/31/2022    5:50 AM 12/30/2022    9:57 AM  CMP  Glucose 70 - 99 mg/dL 119  117  112   BUN 6 - 20 mg/dL '11  10  8   '$ Creatinine 0.61 - 1.24 mg/dL 0.95  1.00  1.07   Sodium 135 - 145 mmol/L 136  136  140   Potassium 3.5 - 5.1 mmol/L 4.3  4.2  3.7   Chloride 98 - 111 mmol/L 103  104  106   CO2 22 - 32 mmol/L '24  24  26    '$ Calcium 8.9 - 10.3 mg/dL 8.8  8.9  8.7   Total Protein 6.5 - 8.1 g/dL 6.4  6.4  6.4   Total Bilirubin 0.3 - 1.2 mg/dL 1.1  0.7  0.6   Alkaline Phos 38 -  126 U/L 42  50  50   AST 15 - 41 U/L '23  20  22   '$ ALT 0 - 44 U/L '18  19  20    '$ Uric acid 7.1 Magnesium 2.2  RADIOGRAPHIC STUDIES: I have personally reviewed the radiological images as listed and agreed with the findings in the report. DG FL GUIDED LUMBAR PUNCTURE  Result Date: 12/12/2022 CLINICAL DATA:  Burkitt's lymphoma.  HIV positive. EXAM: FLUOROSCOPICALLY GUIDED LUMBAR PUNCTURE FOR INTRATHECAL CHEMOTHERAPY FLUOROSCOPY: Radiation Exposure Index (as provided by the fluoroscopic device): 13.3 mGy Kerma PROCEDURE: Informed consent was obtained from the patient prior to the procedure, including potential complications of headache, allergy, and pain. With the patient prone, the lower back was prepped with Betadine. 1% Lidocaine was used for local anesthesia. Lumbar puncture was performed at the L3-4 level using a gauge needle with return of clear colorlessCSF. CSF sample delivered to the laboratory. Pharmacy prepared methotrexate was injected into the subarachnoid space. The patient tolerated the procedure well without apparent complication. IMPRESSION: Fluoroscopically guided lumbar puncture for intrathecal chemotherapy administration. Successful lumbar puncture and injection of intrathecal methotrexate. Electronically Signed   By: Franchot Gallo M.D.   On: 12/12/2022 11:03   DG FLUORO GUIDED LOC OF NEEDLE/CATH TIP FOR SPINAL INJECT LT  Result Date: 12/12/2022 CLINICAL DATA:  Burkitt's lymphoma.  HIV positive. EXAM: FLUOROSCOPICALLY GUIDED LUMBAR PUNCTURE FOR INTRATHECAL CHEMOTHERAPY FLUOROSCOPY: Radiation Exposure Index (as provided by the fluoroscopic device): 13.3 mGy Kerma PROCEDURE: Informed consent was obtained from the patient prior to the procedure, including potential complications of headache, allergy, and pain. With the patient  prone, the lower back was prepped with Betadine. 1% Lidocaine was used for local anesthesia. Lumbar puncture was performed at the L3-4 level using a gauge needle with return of clear colorlessCSF. CSF sample delivered to the laboratory. Pharmacy prepared methotrexate was injected into the subarachnoid space. The patient tolerated the procedure well without apparent complication. IMPRESSION: Fluoroscopically guided lumbar puncture for intrathecal chemotherapy administration. Successful lumbar puncture and injection of intrathecal methotrexate. Electronically Signed   By: Franchot Gallo M.D.   On: 12/12/2022 11:03    ASSESSMENT & PLAN:    42 year old male with HIV/AIDS with recently diagnosed Burkitt's lymphoma    #1  Recently diagnosed Burkitt's lymphoma stage II.  No CNS involvement. PET CT scan with right cervical and right lower neck mass and axillary lymphadenopathy .  No evidence of involvement below the diaphragm. No CNS involvement on lumbar puncture or by clinical symptoms. Status post cycle 2 of daEPOCH-R with no significant toxicities. Had a mild spinal headache after his previous intrathecal methotrexate prophylaxis.   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  Patient reports this was completely treated.   #4  Status post tumor lysis syndrome completed allopurinol for 1 month.  Patient had limited tumor burden most of which has resolved. Currently holding allopurinol from cycle 2 and monitoring closely   #5 normocytic anemia related to chemotherapy.     #6 thrombocytopenia now resolved PLAN: Patient notes no new toxicities from his current cycle 3 of dose adjusted EPOCH-R chemotherapy. Labs done today were discussed in detail with the patient.  CBC shows stable hemoglobin of 9.2 stable counts and platelets. CMP stable Uric acid improved at 7.1 -patient is appropriate to proceed with cycle 3-day 3 of treatment  today. --Continue Biktarvy and Bactrim. -Daily CBC CMP -Lovenox for VTE prophylaxis -Oncology will continue  to follow -He shall receive Rituxan Udenyca/Neulasta as outpatient on 01/06/2023 -Maalox as needed for heartburns -Patient on MiraLAX and senna for constipation  The total time spent in the appointment was 25 minutes*.  All of the patient's questions were answered with apparent satisfaction. The patient knows to call the clinic with any problems, questions or concerns.   Sullivan Lone MD MS AAHIVMS [facility] Hematology/Oncology Physician [facility]  .*Total Encounter Time as defined by the Centers for Medicare and Medicaid Services includes, in addition to the face-to-face time of a patient visit (documented in the note above) non-face-to-face time: obtaining and reviewing outside history, ordering and reviewing medications, tests or procedures, care coordination (communications with other health care professionals or caregivers) and documentation in the medical record.

## 2023-01-02 ENCOUNTER — Encounter: Payer: Self-pay | Admitting: Hematology

## 2023-01-02 ENCOUNTER — Other Ambulatory Visit (HOSPITAL_COMMUNITY): Payer: Self-pay

## 2023-01-02 DIAGNOSIS — Z5111 Encounter for antineoplastic chemotherapy: Secondary | ICD-10-CM | POA: Diagnosis not present

## 2023-01-02 DIAGNOSIS — C8371 Burkitt lymphoma, lymph nodes of head, face, and neck: Secondary | ICD-10-CM | POA: Diagnosis not present

## 2023-01-02 LAB — COMPREHENSIVE METABOLIC PANEL
ALT: 20 U/L (ref 0–44)
AST: 26 U/L (ref 15–41)
Albumin: 3.7 g/dL (ref 3.5–5.0)
Alkaline Phosphatase: 43 U/L (ref 38–126)
Anion gap: 7 (ref 5–15)
BUN: 14 mg/dL (ref 6–20)
CO2: 24 mmol/L (ref 22–32)
Calcium: 8.7 mg/dL — ABNORMAL LOW (ref 8.9–10.3)
Chloride: 103 mmol/L (ref 98–111)
Creatinine, Ser: 1 mg/dL (ref 0.61–1.24)
GFR, Estimated: >60 mL/min (ref >60)
Glucose, Bld: 113 mg/dL — ABNORMAL HIGH (ref 70–99)
Potassium: 4.1 mmol/L (ref 3.5–5.1)
Sodium: 134 mmol/L — ABNORMAL LOW (ref 135–145)
Total Bilirubin: 1.4 mg/dL — ABNORMAL HIGH (ref 0.3–1.2)
Total Protein: 6.6 g/dL (ref 6.5–8.1)

## 2023-01-02 LAB — CBC WITH DIFFERENTIAL/PLATELET
Abs Immature Granulocytes: 0.02 10*3/uL (ref 0.00–0.07)
Basophils Absolute: 0 10*3/uL (ref 0.0–0.1)
Basophils Relative: 0 %
Eosinophils Absolute: 0 10*3/uL (ref 0.0–0.5)
Eosinophils Relative: 0 %
HCT: 29 % — ABNORMAL LOW (ref 39.0–52.0)
Hemoglobin: 9.1 g/dL — ABNORMAL LOW (ref 13.0–17.0)
Immature Granulocytes: 1 %
Lymphocytes Relative: 16 %
Lymphs Abs: 0.6 10*3/uL — ABNORMAL LOW (ref 0.7–4.0)
MCH: 27 pg (ref 26.0–34.0)
MCHC: 31.4 g/dL (ref 30.0–36.0)
MCV: 86.1 fL (ref 80.0–100.0)
Monocytes Absolute: 0.3 10*3/uL (ref 0.1–1.0)
Monocytes Relative: 8 %
Neutro Abs: 2.5 10*3/uL (ref 1.7–7.7)
Neutrophils Relative %: 75 %
Platelets: 289 10*3/uL (ref 150–400)
RBC: 3.37 MIL/uL — ABNORMAL LOW (ref 4.22–5.81)
RDW: 17.9 % — ABNORMAL HIGH (ref 11.5–15.5)
WBC: 3.4 10*3/uL — ABNORMAL LOW (ref 4.0–10.5)
nRBC: 0 % (ref 0.0–0.2)

## 2023-01-02 MED ORDER — MAGNESIUM CITRATE PO SOLN
0.5000 | Freq: Once | ORAL | Status: AC
  Administered 2023-01-02: 0.5 via ORAL
  Filled 2023-01-02: qty 296

## 2023-01-02 MED ORDER — VINCRISTINE SULFATE CHEMO INJECTION 1 MG/ML
Freq: Once | INTRAVENOUS | Status: AC
  Filled 2023-01-02: qty 13

## 2023-01-02 MED ORDER — SODIUM CHLORIDE 0.9 % IV SOLN
Freq: Once | INTRAVENOUS | Status: AC
  Administered 2023-01-02: 8 mg via INTRAVENOUS
  Filled 2023-01-02: qty 4

## 2023-01-02 MED ORDER — SENNOSIDES-DOCUSATE SODIUM 8.6-50 MG PO TABS
2.0000 | ORAL_TABLET | Freq: Two times a day (BID) | ORAL | Status: DC
  Administered 2023-01-02 – 2023-01-03 (×3): 2 via ORAL
  Filled 2023-01-02 (×3): qty 2

## 2023-01-02 NOTE — Progress Notes (Signed)
[name]  HEMATOLOGY/ONCOLOGY INPATIENT PROGRESS NOTE  Date of Service:01/02/2023  Inpatient Attending: .Brunetta Genera, MD   SUBJECTIVE  .Logan Ward was seen in medical oncology follow-up.  He is receiving cycle 3-day 4 of dose adjusted EPOCH-R chemotherapy for his Burkitt's lymphoma. He notes no acute new symptoms overnight. Notes that he is still being bothered by constipation and has not had a bowel movement over the last 2 to 3 days despite taking senna every day.  Declined using MiraLAX.  Patient agrees with trying magnesium citrate order placed. No uncontrolled nausea or vomiting.  Some grade 1 heartburns. Labs done today were discussed in detail with the patient.   OBJECTIVE:  NAD  PHYSICAL EXAMINATION: . Vitals:   01/01/23 0218 01/01/23 1358 01/01/23 2119 01/02/23 0554  BP: 106/68 129/82 125/70 (!) 101/54  Pulse: (!) 58 68 61 (!) 56  Resp: '16 20 20 17  '$ Temp: 98.3 F (36.8 C) 98.3 F (36.8 C) 98.2 F (36.8 C) 98.2 F (36.8 C)  TempSrc: Oral Oral Oral Oral  SpO2: 100% 98% 99% 100%  Weight:      Height:       Filed Weights   12/30/22 2142  Weight: 275 lb 5.7 oz (124.9 kg)   .Body mass index is 36.33 kg/m. . . GENERAL:alert, in no acute distress and comfortable SKIN: no acute rashes, no significant lesions EYES: conjunctiva are pink and non-injected, sclera anicteric OROPHARYNX: MMM, no exudates, no oropharyngeal erythema or ulceration NECK: supple, no JVD LYMPH:  no palpable lymphadenopathy in the cervical, axillary or inguinal regions LUNGS: clear to auscultation b/l with normal respiratory effort HEART: regular rate & rhythm ABDOMEN:  normoactive bowel sounds , non tender, not distended. Extremity: no pedal edema PSYCH: alert & oriented x 3 with fluent speech NEURO: no focal motor/sensory deficits   MEDICAL HISTORY:  Past Medical History:  Diagnosis Date   Anal fissure    HIV infection ([place])    Internal hemorrhoids    Obesity    Rectal  ulcer     SURGICAL HISTORY: Past Surgical History:  Procedure Laterality Date   IR IMAGING GUIDED PORT INSERTION  11/18/2022    SOCIAL HISTORY: Social History   Socioeconomic History   Marital status: Single    Spouse name: Not on file   Number of children: Not on file   Years of education: Not on file   Highest education level: Not on file  Occupational History   Not on file  Tobacco Use   Smoking status: Never   Smokeless tobacco: Never  Vaping Use   Vaping Use: Never used  Substance and Sexual Activity   Alcohol use: No   Drug use: No   Sexual activity: Yes    Comment: declined condoms  Other Topics Concern   Not on file  Social History Narrative   Not on file   Social Determinants of Health   Financial Resource Strain: Low Risk  (10/24/2022)   Overall Financial Resource Strain (CARDIA)    Difficulty of Paying Living Expenses: Not hard at all  Food Insecurity: No Food Insecurity (12/30/2022)   Hunger Vital Sign    Worried About Running Out of Food in the Last Year: Never true    Ran Out of Food in the Last Year: Never true  Transportation Needs: No Transportation Needs (12/30/2022)   PRAPARE - Hydrologist (Medical): No    Lack of Transportation (Non-Medical): No  Physical Activity: Sufficiently Active (10/24/2022)  Exercise Vital Sign    Days of Exercise per Week: 3 days    Minutes of Exercise per Session: 60 min  Recent Concern: Physical Activity - Insufficiently Active (10/24/2022)   Exercise Vital Sign    Days of Exercise per Week: 3 days    Minutes of Exercise per Session: 20 min  Stress: No Stress Concern Present (10/24/2022)   [place] of Stress : Not at all  Social Connections: Unknown (10/24/2022)   Social Connection and Isolation Panel [NHANES]    Frequency of Communication with Friends and Family: Twice a week    Frequency of Social Gatherings  with Friends and Family: Twice a week    Attends Religious Services: 1 to 4 times per year    Active Member of [employer] or Organizations: No    Attends Archivist Meetings: 1 to 4 times per year    Marital Status: Patient refused  Intimate Partner Violence: Not At Risk (12/30/2022)   Humiliation, Afraid, Rape, and Kick questionnaire    Fear of Current or Ex-Partner: No    Emotionally Abused: No    Physically Abused: No    Sexually Abused: No    FAMILY HISTORY: Family History  Problem Relation Age of Onset   Hypertension Father    Throat cancer Father    Heart attack Brother    Colon cancer Neg Hx    Esophageal cancer Neg Hx    Stomach cancer Neg Hx     ALLERGIES:  has No Known Allergies.  MEDICATIONS:  Scheduled Meds:  bictegravir-emtricitabine-tenofovir AF  1 tablet Oral Daily   Chlorhexidine Gluconate Cloth  6 each Topical Daily   DOXOrubicin (ADRIAMYCIN) 26 mg, etoposide (VEPESID) 128 mg, vinCRIStine (ONCOVIN) 1 mg in sodium chloride 0.9 % 1,000 mL chemo infusion   Intravenous Once   enoxaparin (LOVENOX) injection  60 mg Subcutaneous Q24H   polyethylene glycol  17 g Oral Daily   sulfamethoxazole-trimethoprim  1 tablet Oral Daily   Continuous Infusions:  sodium chloride 10 mL/hr at 01/01/23 0638   PRN Meds:.acetaminophen, alum & mag hydroxide-simeth, Cold Pack, Hot Pack, hydrocortisone, senna-docusate  REVIEW OF SYSTEMS:    .10 Point review of Systems was done is negative except as noted above.  LABORATORY DATA:  I have reviewed the data as listed  .    Latest Ref Rng & Units 01/02/2023    5:25 AM 01/01/2023    1:32 PM 12/30/2022    9:57 AM  CBC  WBC 4.0 - 10.5 K/uL 3.4  5.5  3.3   Hemoglobin 13.0 - 17.0 g/dL 9.1  9.1  8.9   Hematocrit 39.0 - 52.0 % 29.0  28.9  29.4   Platelets 150 - 400 K/uL 289  284  179     .    Latest Ref Rng & Units 01/02/2023    5:25 AM 01/01/2023    5:12 AM 12/31/2022    5:50 AM  CMP  Glucose 70 - 99 mg/dL 113  119  117    BUN 6 - 20 mg/dL '14  11  10   '$ Creatinine 0.61 - 1.24 mg/dL 1.00  0.95  1.00   Sodium 135 - 145 mmol/L 134  136  136   Potassium 3.5 - 5.1 mmol/L 4.1  4.3  4.2   Chloride 98 - 111 mmol/L 103  103  104   CO2 22 - 32 mmol/L 24  24  24  Calcium 8.9 - 10.3 mg/dL 8.7  8.8  8.9   Total Protein 6.5 - 8.1 g/dL 6.6  6.4  6.4   Total Bilirubin 0.3 - 1.2 mg/dL 1.4  1.1  0.7   Alkaline Phos 38 - 126 U/L 43  42  50   AST 15 - 41 U/L '26  23  20   '$ ALT 0 - 44 U/L '20  18  19    '$ Uric acid 7.1 Magnesium 2.2  RADIOGRAPHIC STUDIES: I have personally reviewed the radiological images as listed and agreed with the findings in the report. DG FL GUIDED LUMBAR PUNCTURE  Result Date: 12/12/2022 CLINICAL DATA:  Burkitt's lymphoma.  HIV positive. EXAM: FLUOROSCOPICALLY GUIDED LUMBAR PUNCTURE FOR INTRATHECAL CHEMOTHERAPY FLUOROSCOPY: Radiation Exposure Index (as provided by the fluoroscopic device): 13.3 mGy Kerma PROCEDURE: Informed consent was obtained from the patient prior to the procedure, including potential complications of headache, allergy, and pain. With the patient prone, the lower back was prepped with Betadine. 1% Lidocaine was used for local anesthesia. Lumbar puncture was performed at the L3-4 level using a gauge needle with return of clear colorlessCSF. CSF sample delivered to the laboratory. Pharmacy prepared methotrexate was injected into the subarachnoid space. The patient tolerated the procedure well without apparent complication. IMPRESSION: Fluoroscopically guided lumbar puncture for intrathecal chemotherapy administration. Successful lumbar puncture and injection of intrathecal methotrexate. Electronically Signed   By: Franchot Gallo M.D.   On: 12/12/2022 11:03   DG FLUORO GUIDED LOC OF NEEDLE/CATH TIP FOR SPINAL INJECT LT  Result Date: 12/12/2022 CLINICAL DATA:  Burkitt's lymphoma.  HIV positive. EXAM: FLUOROSCOPICALLY GUIDED LUMBAR PUNCTURE FOR INTRATHECAL CHEMOTHERAPY FLUOROSCOPY: Radiation  Exposure Index (as provided by the fluoroscopic device): 13.3 mGy Kerma PROCEDURE: Informed consent was obtained from the patient prior to the procedure, including potential complications of headache, allergy, and pain. With the patient prone, the lower back was prepped with Betadine. 1% Lidocaine was used for local anesthesia. Lumbar puncture was performed at the L3-4 level using a gauge needle with return of clear colorlessCSF. CSF sample delivered to the laboratory. Pharmacy prepared methotrexate was injected into the subarachnoid space. The patient tolerated the procedure well without apparent complication. IMPRESSION: Fluoroscopically guided lumbar puncture for intrathecal chemotherapy administration. Successful lumbar puncture and injection of intrathecal methotrexate. Electronically Signed   By: Franchot Gallo M.D.   On: 12/12/2022 11:03    ASSESSMENT & PLAN:    42 year old male with HIV/AIDS with recently diagnosed Burkitt's lymphoma    #1  Recently diagnosed Burkitt's lymphoma stage II.  No CNS involvement. PET CT scan with right cervical and right lower neck mass and axillary lymphadenopathy .  No evidence of involvement below the diaphragm. No CNS involvement on lumbar puncture or by clinical symptoms. Status post cycle 2 of daEPOCH-R with no significant toxicities. Had a mild spinal headache after his previous intrathecal methotrexate prophylaxis.   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  Patient reports this was completely treated.   #4  Status post tumor lysis syndrome completed allopurinol for 1 month.  Patient had limited tumor burden most of which has resolved. Currently holding allopurinol from cycle 2 and monitoring closely   #5 normocytic anemia related to chemotherapy.     #6 thrombocytopenia now resolved  #7 chemotherapy-related constipation PLAN: Patient notes no new toxicities from his current cycle 3 D4 of dose  adjusted EPOCH-R chemotherapy. Labs done today were discussed in detail  with the patient.   -patient is appropriate to proceed with cycle 3-day 4 of treatment today. --Continue Biktarvy and Bactrim. -Daily CBC CMP -Lovenox for VTE prophylaxis -Oncology will continue to follow -He shall receive Rituxan Udenyca/Neulasta as outpatient on 01/06/2023 -Maalox as needed for heartburns -Patient on senna for constipation and has been taking 2 tablets at bedtime but has not helped he has not had a bowel movement in the last 3 days.  Increased senna S to 2 tablets twice a day and added 1 dose of magnesium citrate to help with his chemotherapy related constipation. -Continue other supportive medications -Patient will likely be discharged tomorrow after completing his chemotherapy.  The total time spent in the appointment was 30 minutes*.  All of the patient's questions were answered with apparent satisfaction. The patient knows to call the clinic with any problems, questions or concerns.   Sullivan Lone MD MS AAHIVMS [facility] Hematology/Oncology Physician [facility]  .*Total Encounter Time as defined by the Centers for Medicare and Medicaid Services includes, in addition to the face-to-face time of a patient visit (documented in the note above) non-face-to-face time: obtaining and reviewing outside history, ordering and reviewing medications, tests or procedures, care coordination (communications with other health care professionals or caregivers) and documentation in the medical record.

## 2023-01-03 ENCOUNTER — Encounter: Payer: Self-pay | Admitting: Hematology

## 2023-01-03 ENCOUNTER — Other Ambulatory Visit (HOSPITAL_COMMUNITY): Payer: Self-pay

## 2023-01-03 DIAGNOSIS — C8371 Burkitt lymphoma, lymph nodes of head, face, and neck: Secondary | ICD-10-CM | POA: Diagnosis not present

## 2023-01-03 DIAGNOSIS — Z5111 Encounter for antineoplastic chemotherapy: Secondary | ICD-10-CM | POA: Diagnosis not present

## 2023-01-03 LAB — CBC WITH DIFFERENTIAL/PLATELET
Abs Immature Granulocytes: 0.01 10*3/uL (ref 0.00–0.07)
Basophils Absolute: 0 10*3/uL (ref 0.0–0.1)
Basophils Relative: 0 %
Eosinophils Absolute: 0 10*3/uL (ref 0.0–0.5)
Eosinophils Relative: 0 %
HCT: 28.4 % — ABNORMAL LOW (ref 39.0–52.0)
Hemoglobin: 9.1 g/dL — ABNORMAL LOW (ref 13.0–17.0)
Immature Granulocytes: 0 %
Lymphocytes Relative: 25 %
Lymphs Abs: 0.6 10*3/uL — ABNORMAL LOW (ref 0.7–4.0)
MCH: 27.1 pg (ref 26.0–34.0)
MCHC: 32 g/dL (ref 30.0–36.0)
MCV: 84.5 fL (ref 80.0–100.0)
Monocytes Absolute: 0.1 10*3/uL (ref 0.1–1.0)
Monocytes Relative: 5 %
Neutro Abs: 1.6 10*3/uL — ABNORMAL LOW (ref 1.7–7.7)
Neutrophils Relative %: 70 %
Platelets: 315 10*3/uL (ref 150–400)
RBC: 3.36 MIL/uL — ABNORMAL LOW (ref 4.22–5.81)
RDW: 17.4 % — ABNORMAL HIGH (ref 11.5–15.5)
WBC: 2.2 10*3/uL — ABNORMAL LOW (ref 4.0–10.5)
nRBC: 0 % (ref 0.0–0.2)

## 2023-01-03 LAB — COMPREHENSIVE METABOLIC PANEL
ALT: 28 U/L (ref 0–44)
AST: 26 U/L (ref 15–41)
Albumin: 3.7 g/dL (ref 3.5–5.0)
Alkaline Phosphatase: 39 U/L (ref 38–126)
Anion gap: 10 (ref 5–15)
BUN: 14 mg/dL (ref 6–20)
CO2: 24 mmol/L (ref 22–32)
Calcium: 8.8 mg/dL — ABNORMAL LOW (ref 8.9–10.3)
Chloride: 100 mmol/L (ref 98–111)
Creatinine, Ser: 0.99 mg/dL (ref 0.61–1.24)
GFR, Estimated: >60 mL/min (ref >60)
Glucose, Bld: 104 mg/dL — ABNORMAL HIGH (ref 70–99)
Potassium: 3.9 mmol/L (ref 3.5–5.1)
Sodium: 134 mmol/L — ABNORMAL LOW (ref 135–145)
Total Bilirubin: 1.2 mg/dL (ref 0.3–1.2)
Total Protein: 6.7 g/dL (ref 6.5–8.1)

## 2023-01-03 MED ORDER — SODIUM CHLORIDE 0.9 % IV SOLN
Freq: Once | INTRAVENOUS | Status: AC
  Administered 2023-01-03: 16 mg via INTRAVENOUS
  Filled 2023-01-03: qty 8

## 2023-01-03 MED ORDER — SODIUM CHLORIDE 0.9 % IV SOLN
750.0000 mg/m2 | Freq: Once | INTRAVENOUS | Status: AC
  Administered 2023-01-03: 2000 mg via INTRAVENOUS
  Filled 2023-01-03: qty 100

## 2023-01-03 MED ORDER — HEPARIN SOD (PORK) LOCK FLUSH 100 UNIT/ML IV SOLN
500.0000 [IU] | INTRAVENOUS | Status: AC | PRN
  Administered 2023-01-03: 500 [IU]

## 2023-01-03 NOTE — Progress Notes (Signed)
Patient is being discharged. Discharged instructions reviewed including medications. Patient verbalized a full understanding. Patients friend at bedside that will transport him home.

## 2023-01-03 NOTE — Discharge Summary (Signed)
. [place]  [phone]) [phone] [phone]    Physician Discharge Summary     Patient ID: Logan Ward MRN: [id number] DOB/AGE: Oct 21, 1981 42 y.o.  Admit date: 12/30/2022 Discharge date: 01/03/2023  Primary Care Physician:  Pcp, No  Discharge Diagnoses:    Present on Admission:  Burkitt's lymphoma [facility])   Discharge Medications:  Allergies as of 01/03/2023   No Known Allergies      Medication List     STOP taking these medications    gabapentin 100 MG capsule Commonly known as: NEURONTIN       TAKE these medications    Biktarvy 50-200-25 MG Tabs tablet Generic drug: bictegravir-emtricitabine-tenofovir AF Take 1 tablet by mouth daily.   dexamethasone 4 MG tablet Commonly known as: DECADRON Take 1 tablet (4 mg total) by mouth 2 (two) times daily with breakfast and lunch for 2 days after completion of chemotherapy   lidocaine-prilocaine cream Commonly known as: EMLA Apply to affected area once What changed:  how much to take how to take this when to take this additional instructions   ondansetron 8 MG tablet Commonly known as: ZOFRAN Take 1 tablet (8 mg total) by mouth every 8 (eight) hours as needed for nausea.   polyethylene glycol 17 g packet Commonly known as: MIRALAX / GLYCOLAX Take 1 packet (17 g) by mouth daily.   prochlorperazine 10 MG tablet Commonly known as: COMPAZINE Take 1 tablet (10 mg total) by mouth every 6 (six) hours as needed. What changed: reasons to take this   senna-docusate 8.6-50 MG tablet Commonly known as: Senokot-S Take 2 tablets by mouth at bedtime as needed for mild constipation.   sulfamethoxazole-trimethoprim 800-160 MG tablet Commonly known as: BACTRIM DS Take 1 tablet by mouth daily.   Tylenol 325 MG tablet Generic drug: acetaminophen Take 325-650 mg by mouth every 8 (eight) hours as needed for mild pain or headache.         Disposition and Follow-up:    Significant Diagnostic Studies:  DG FL GUIDED LUMBAR PUNCTURE  Result Date: 12/12/2022 CLINICAL DATA:  Burkitt's lymphoma.  HIV positive. EXAM: FLUOROSCOPICALLY GUIDED LUMBAR PUNCTURE FOR INTRATHECAL CHEMOTHERAPY FLUOROSCOPY: Radiation Exposure Index (as provided by the fluoroscopic device): 13.3 mGy Kerma PROCEDURE: Informed consent was obtained from the patient prior to the procedure, including potential complications of headache, allergy, and pain. With the patient prone, the lower back was prepped with Betadine. 1% Lidocaine was used for local anesthesia. Lumbar puncture was performed at the L3-4 level using a gauge needle with return of clear colorlessCSF. CSF sample delivered to the laboratory. Pharmacy prepared methotrexate was injected into the subarachnoid space. The patient tolerated the procedure well without apparent complication. IMPRESSION: Fluoroscopically guided lumbar puncture for intrathecal chemotherapy administration. Successful lumbar puncture and injection of intrathecal methotrexate. Electronically Signed   By: Franchot Gallo M.D.   On: 12/12/2022 11:03   DG FLUORO GUIDED LOC OF NEEDLE/CATH TIP FOR SPINAL INJECT LT  Result Date: 12/12/2022 CLINICAL DATA:  Burkitt's lymphoma.  HIV positive. EXAM: FLUOROSCOPICALLY GUIDED LUMBAR PUNCTURE FOR INTRATHECAL CHEMOTHERAPY FLUOROSCOPY: Radiation Exposure Index (as provided by the fluoroscopic device): 13.3 mGy Kerma PROCEDURE: Informed consent was obtained from the patient prior to the procedure, including potential complications of headache, allergy, and pain. With the patient prone, the lower back was prepped with Betadine. 1% Lidocaine was used for local anesthesia. Lumbar puncture was performed at the L3-4 level using a gauge needle with return of clear colorlessCSF. CSF sample delivered  to the laboratory. Pharmacy prepared methotrexate was injected into the subarachnoid space. The patient tolerated the procedure well without apparent  complication. IMPRESSION: Fluoroscopically guided lumbar puncture for intrathecal chemotherapy administration. Successful lumbar puncture and injection of intrathecal methotrexate. Electronically Signed   By: Franchot Gallo M.D.   On: 12/12/2022 11:03    Discharge Laboratory Values: .    Latest Ref Rng & Units 01/03/2023    5:00 AM 01/02/2023    5:25 AM 01/01/2023    1:32 PM  CBC  WBC 4.0 - 10.5 K/uL 2.2  3.4  5.5   Hemoglobin 13.0 - 17.0 g/dL 9.1  9.1  9.1   Hematocrit 39.0 - 52.0 % 28.4  29.0  28.9   Platelets 150 - 400 K/uL 315  289  284    .    Latest Ref Rng & Units 01/03/2023    5:00 AM 01/02/2023    5:25 AM 01/01/2023    5:12 AM  CMP  Glucose 70 - 99 mg/dL 104  113  119   BUN 6 - 20 mg/dL '14  14  11   '$ Creatinine 0.61 - 1.24 mg/dL 0.99  1.00  0.95   Sodium 135 - 145 mmol/L 134  134  136   Potassium 3.5 - 5.1 mmol/L 3.9  4.1  4.3   Chloride 98 - 111 mmol/L 100  103  103   CO2 22 - 32 mmol/L '24  24  24   '$ Calcium 8.9 - 10.3 mg/dL 8.8  8.7  8.8   Total Protein 6.5 - 8.1 g/dL 6.7  6.6  6.4   Total Bilirubin 0.3 - 1.2 mg/dL 1.2  1.4  1.1   Alkaline Phos 38 - 126 U/L 39  43  42   AST 15 - 41 U/L '26  26  23   '$ ALT 0 - 44 U/L '28  20  18     '$ Brief H and P: For complete details please refer to admission H and P, but in brief,   42 y.o. 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   Issues during hospitalization   #1  Recently diagnosed Burkitt's lymphoma stage II.  No CNS involvement. PET CT scan with right cervical and right lower neck mass and axillary lymphadenopathy .  No evidence of involvement below the diaphragm. No CNS involvement on lumbar puncture or by clinical symptoms. Status post cycle 2 of daEPOCH-R with no significant toxicities. Had a mild spinal headache after his previous intrathecal methotrexate prophylaxis.    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  Patient reports this was completely treated.   #4  Status post tumor lysis syndrome completed allopurinol for 1 month.  Patient had limited tumor burden most of which has resolved. Currently holding allopurinol from cycle 2 and monitoring closely   #5 normocytic anemia related to chemotherapy.     #6 thrombocytopenia now resolved   #7 chemotherapy-related constipation - resolved with senna-s and Mag citrate. PLAN: Patient notes no significant new toxicities from his current cycle 3 of dose adjusted EPOCH-R chemotherapy. --Continue Biktarvy and Bactrim. -Daily CBC CMP -Lovenox for VTE prophylaxis done -He shall receive Rituxan Udenyca/Neulasta as outpatient on 01/06/2023 RTC with Dr Irene Limbo with portflush and labs on 01/16/2023 -outpatient PET/CT in 7-10 days -was recommended to  use OTC maalox or omeproazole for GERD -continue Miralax and prn senna-s for chemotherapy related constipation. -Continue other supportive medications including salt and baking soda mouthwashes. -patient tolerated his C3 EPOCH well and was discharged in stable condition   Physical Exam at Discharge: BP 100/60 (BP Location: Left Arm)   Pulse 73   Temp 98.2 F (36.8 C) (Oral)   Resp 20   Ht '6\' 1"'$  (1.854 m)   Wt 275 lb 5.7 oz (124.9 kg)   SpO2 99%   BMI 36.33 kg/m  . GENERAL:alert, in no acute distress and comfortable SKIN: no acute rashes, no significant lesions EYES: conjunctiva are pink and non-injected, sclera anicteric OROPHARYNX: MMM, no exudates, no oropharyngeal erythema or ulceration NECK: supple, no JVD LYMPH:  no palpable lymphadenopathy in the cervical, axillary or inguinal regions LUNGS: clear to auscultation b/l with normal respiratory effort HEART: regular rate & rhythm ABDOMEN:  normoactive bowel sounds , non tender, not distended. Extremity: no pedal edema PSYCH: alert & oriented x 3 with  fluent speech NEURO: no focal motor/sensory deficits  Hospital Course:  Principal Problem:   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[place]) Active Problems:   Anemia   Diet:  Regular, low salt  Activity:  Advanced as tolerated. Infection precautions are discussed  Condition at Discharge:  Stable   Signed: Dr. Sullivan Lone MD [place] [phone]  01/03/2023, 10:51 AM TT spent discharging patient>30 mins

## 2023-01-04 ENCOUNTER — Encounter: Payer: Self-pay | Admitting: Hematology

## 2023-01-06 ENCOUNTER — Inpatient Hospital Stay: Payer: 59

## 2023-01-06 ENCOUNTER — Other Ambulatory Visit: Payer: Self-pay

## 2023-01-06 VITALS — BP 121/59 | HR 85 | Temp 98.7°F | Resp 16 | Wt 273.0 lb

## 2023-01-06 DIAGNOSIS — Z5189 Encounter for other specified aftercare: Secondary | ICD-10-CM | POA: Diagnosis not present

## 2023-01-06 DIAGNOSIS — B20 Human immunodeficiency virus [HIV] disease: Secondary | ICD-10-CM | POA: Diagnosis present

## 2023-01-06 DIAGNOSIS — Z79899 Other long term (current) drug therapy: Secondary | ICD-10-CM | POA: Diagnosis not present

## 2023-01-06 DIAGNOSIS — Z7189 Other specified counseling: Secondary | ICD-10-CM

## 2023-01-06 DIAGNOSIS — D696 Thrombocytopenia, unspecified: Secondary | ICD-10-CM | POA: Diagnosis not present

## 2023-01-06 DIAGNOSIS — C8378 Burkitt lymphoma, lymph nodes of multiple sites: Secondary | ICD-10-CM

## 2023-01-06 DIAGNOSIS — Z95828 Presence of other vascular implants and grafts: Secondary | ICD-10-CM

## 2023-01-06 DIAGNOSIS — Z79624 Long term (current) use of inhibitors of nucleotide synthesis: Secondary | ICD-10-CM | POA: Diagnosis not present

## 2023-01-06 DIAGNOSIS — Z7952 Long term (current) use of systemic steroids: Secondary | ICD-10-CM | POA: Diagnosis not present

## 2023-01-06 DIAGNOSIS — C8370 Burkitt lymphoma, unspecified site: Secondary | ICD-10-CM | POA: Diagnosis present

## 2023-01-06 DIAGNOSIS — Z5112 Encounter for antineoplastic immunotherapy: Secondary | ICD-10-CM | POA: Diagnosis present

## 2023-01-06 MED ORDER — ACETAMINOPHEN 325 MG PO TABS
650.0000 mg | ORAL_TABLET | Freq: Once | ORAL | Status: AC
  Administered 2023-01-06: 650 mg via ORAL
  Filled 2023-01-06: qty 2

## 2023-01-06 MED ORDER — PEGFILGRASTIM-CBQV 6 MG/0.6ML ~~LOC~~ SOSY
6.0000 mg | PREFILLED_SYRINGE | Freq: Once | SUBCUTANEOUS | Status: AC
  Administered 2023-01-06: 6 mg via SUBCUTANEOUS
  Filled 2023-01-06: qty 0.6

## 2023-01-06 MED ORDER — SODIUM CHLORIDE 0.9 % IV SOLN: Freq: Once | INTRAVENOUS | Status: AC

## 2023-01-06 MED ORDER — DIPHENHYDRAMINE HCL 25 MG PO CAPS
50.0000 mg | ORAL_CAPSULE | Freq: Once | ORAL | Status: AC
  Administered 2023-01-06: 50 mg via ORAL
  Filled 2023-01-06: qty 2

## 2023-01-06 MED ORDER — METHYLPREDNISOLONE SODIUM SUCC 125 MG IJ SOLR
125.0000 mg | Freq: Every day | INTRAMUSCULAR | Status: DC
  Administered 2023-01-06: 125 mg via INTRAVENOUS
  Filled 2023-01-06: qty 2

## 2023-01-06 MED ORDER — SODIUM CHLORIDE 0.9% FLUSH
10.0000 mL | Freq: Once | INTRAVENOUS | Status: AC
  Administered 2023-01-06: 10 mL

## 2023-01-06 MED ORDER — SODIUM CHLORIDE 0.9% FLUSH
10.0000 mL | INTRAVENOUS | Status: DC | PRN
  Administered 2023-01-06: 10 mL

## 2023-01-06 MED ORDER — FAMOTIDINE IN NACL 20-0.9 MG/50ML-% IV SOLN
20.0000 mg | Freq: Once | INTRAVENOUS | Status: AC
  Administered 2023-01-06: 20 mg via INTRAVENOUS
  Filled 2023-01-06: qty 50

## 2023-01-06 MED ORDER — HEPARIN SOD (PORK) LOCK FLUSH 100 UNIT/ML IV SOLN
500.0000 [IU] | Freq: Once | INTRAVENOUS | Status: AC | PRN
  Administered 2023-01-06: 500 [IU]

## 2023-01-06 MED ORDER — ALTEPLASE 2 MG IJ SOLR
2.0000 mg | Freq: Once | INTRAMUSCULAR | Status: AC
  Administered 2023-01-06: 2 mg
  Filled 2023-01-06: qty 2

## 2023-01-06 MED ORDER — SODIUM CHLORIDE 0.9 % IV SOLN
375.0000 mg/m2 | Freq: Once | INTRAVENOUS | Status: AC
  Administered 2023-01-06: 1000 mg via INTRAVENOUS
  Filled 2023-01-06: qty 100

## 2023-01-06 NOTE — Patient Instructions (Signed)
[facility]  Discharge Instructions: Thank you for choosing [facility] to provide your oncology and hematology care.   If you have a lab appointment with the [facility], please go directly to the [facility] and check in at the registration area.   Wear comfortable clothing and clothing appropriate for easy access to any Portacath or PICC line.   We strive to give you quality time with your provider. You may need to reschedule your appointment if you arrive late (15 or more minutes).  Arriving late affects you and other patients whose appointments are after yours.  Also, if you miss three or more appointments without notifying the office, you may be dismissed from the clinic at the provider's discretion.      For prescription refill requests, have your pharmacy contact our office and allow 72 hours for refills to be completed.    Today you received the following chemotherapy and/or immunotherapy agents: rituximab      To help prevent nausea and vomiting after your treatment, we encourage you to take your nausea medication as directed.  BELOW ARE SYMPTOMS THAT SHOULD BE REPORTED IMMEDIATELY: *FEVER GREATER THAN 100.4 F (38 C) OR HIGHER *CHILLS OR SWEATING *NAUSEA AND VOMITING THAT IS NOT CONTROLLED WITH YOUR NAUSEA MEDICATION *UNUSUAL SHORTNESS OF BREATH *UNUSUAL BRUISING OR BLEEDING *URINARY PROBLEMS (pain or burning when urinating, or frequent urination) *BOWEL PROBLEMS (unusual diarrhea, constipation, pain near the anus) TENDERNESS IN MOUTH AND THROAT WITH OR WITHOUT PRESENCE OF ULCERS (sore throat, sores in mouth, or a toothache) UNUSUAL RASH, SWELLING OR PAIN  UNUSUAL VAGINAL DISCHARGE OR ITCHING   Items with * indicate a potential emergency and should be followed up as soon as possible or go to the Emergency Department if any problems should occur.  Please show the CHEMOTHERAPY ALERT CARD or IMMUNOTHERAPY ALERT CARD at  check-in to the Emergency Department and triage nurse.  Should you have questions after your visit or need to cancel or reschedule your appointment, please contact [facility]  Dept: [phone]  and follow the prompts.  Office hours are 8:00 a.m. to 4:30 p.m. Monday - Friday. Please note that voicemails left after 4:00 p.m. may not be returned until the following business day.  We are closed weekends and major holidays. You have access to a nurse at all times for urgent questions. Please call the main number to the clinic Dept: [phone] and follow the prompts.   For any non-urgent questions, you may also contact your provider using MyChart. We now offer e-Visits for anyone 75 and older to request care online for non-urgent symptoms. For details visit [email or web address].   Also download the MyChart app! Go to the app store, search "MyChart", open the app, select [facility], and log in with your MyChart username and password.

## 2023-01-10 ENCOUNTER — Other Ambulatory Visit: Payer: Self-pay

## 2023-01-10 ENCOUNTER — Encounter (HOSPITAL_COMMUNITY): Payer: Self-pay | Admitting: *Deleted

## 2023-01-10 ENCOUNTER — Inpatient Hospital Stay (HOSPITAL_COMMUNITY)
Admission: EM | Admit: 2023-01-10 | Discharge: 2023-01-13 | DRG: 974 | Disposition: A | Discharge status: Home / Self Care | Payer: 59 | Attending: Internal Medicine | Admitting: Internal Medicine

## 2023-01-10 ENCOUNTER — Inpatient Hospital Stay (HOSPITAL_BASED_OUTPATIENT_CLINIC_OR_DEPARTMENT_OTHER): Payer: 59 | Admitting: Physician Assistant

## 2023-01-10 ENCOUNTER — Inpatient Hospital Stay: Payer: 59

## 2023-01-10 ENCOUNTER — Emergency Department (HOSPITAL_COMMUNITY): Payer: 59

## 2023-01-10 VITALS — BP 121/73 | HR 103 | Temp 101.3°F | Resp 20 | Ht 73.0 in | Wt 276.6 lb

## 2023-01-10 VITALS — Temp 100.3°F

## 2023-01-10 DIAGNOSIS — C8370 Burkitt lymphoma, unspecified site: Secondary | ICD-10-CM | POA: Diagnosis present

## 2023-01-10 DIAGNOSIS — N3001 Acute cystitis with hematuria: Secondary | ICD-10-CM | POA: Diagnosis present

## 2023-01-10 DIAGNOSIS — K626 Ulcer of anus and rectum: Secondary | ICD-10-CM | POA: Diagnosis present

## 2023-01-10 DIAGNOSIS — A419 Sepsis, unspecified organism: Secondary | ICD-10-CM | POA: Diagnosis present

## 2023-01-10 DIAGNOSIS — N3000 Acute cystitis without hematuria: Secondary | ICD-10-CM | POA: Diagnosis present

## 2023-01-10 DIAGNOSIS — K648 Other hemorrhoids: Secondary | ICD-10-CM | POA: Diagnosis present

## 2023-01-10 DIAGNOSIS — E669 Obesity, unspecified: Secondary | ICD-10-CM | POA: Diagnosis present

## 2023-01-10 DIAGNOSIS — B20 Human immunodeficiency virus [HIV] disease: Secondary | ICD-10-CM | POA: Diagnosis present

## 2023-01-10 DIAGNOSIS — R5081 Fever presenting with conditions classified elsewhere: Secondary | ICD-10-CM

## 2023-01-10 DIAGNOSIS — D709 Neutropenia, unspecified: Secondary | ICD-10-CM

## 2023-01-10 DIAGNOSIS — C8378 Burkitt lymphoma, lymph nodes of multiple sites: Secondary | ICD-10-CM | POA: Diagnosis not present

## 2023-01-10 DIAGNOSIS — D6481 Anemia due to antineoplastic chemotherapy: Secondary | ICD-10-CM | POA: Diagnosis present

## 2023-01-10 DIAGNOSIS — Z808 Family history of malignant neoplasm of other organs or systems: Secondary | ICD-10-CM | POA: Diagnosis not present

## 2023-01-10 DIAGNOSIS — Z8249 Family history of ischemic heart disease and other diseases of the circulatory system: Secondary | ICD-10-CM

## 2023-01-10 DIAGNOSIS — C8371 Burkitt lymphoma, lymph nodes of head, face, and neck: Secondary | ICD-10-CM

## 2023-01-10 DIAGNOSIS — Z79899 Other long term (current) drug therapy: Secondary | ICD-10-CM

## 2023-01-10 DIAGNOSIS — D696 Thrombocytopenia, unspecified: Secondary | ICD-10-CM

## 2023-01-10 DIAGNOSIS — Z6836 Body mass index (BMI) 36.0-36.9, adult: Secondary | ICD-10-CM | POA: Diagnosis not present

## 2023-01-10 DIAGNOSIS — R319 Hematuria, unspecified: Secondary | ICD-10-CM | POA: Diagnosis not present

## 2023-01-10 DIAGNOSIS — D61810 Antineoplastic chemotherapy induced pancytopenia: Secondary | ICD-10-CM | POA: Diagnosis present

## 2023-01-10 DIAGNOSIS — Q214 Aortopulmonary septal defect: Secondary | ICD-10-CM | POA: Diagnosis not present

## 2023-01-10 DIAGNOSIS — T451X5A Adverse effect of antineoplastic and immunosuppressive drugs, initial encounter: Secondary | ICD-10-CM | POA: Diagnosis present

## 2023-01-10 DIAGNOSIS — R509 Fever, unspecified: Secondary | ICD-10-CM

## 2023-01-10 DIAGNOSIS — Z1152 Encounter for screening for COVID-19: Secondary | ICD-10-CM | POA: Diagnosis not present

## 2023-01-10 DIAGNOSIS — D61818 Other pancytopenia: Secondary | ICD-10-CM

## 2023-01-10 HISTORY — DX: Acute cystitis without hematuria: N30.00

## 2023-01-10 LAB — CBC WITH DIFFERENTIAL (CANCER CENTER ONLY)
Abs Immature Granulocytes: 0.02 10*3/uL (ref 0.00–0.07)
Basophils Absolute: 0 10*3/uL (ref 0.0–0.1)
Basophils Relative: 1 %
Eosinophils Absolute: 0 10*3/uL (ref 0.0–0.5)
Eosinophils Relative: 1 %
HCT: 23.1 % — ABNORMAL LOW (ref 39.0–52.0)
Hemoglobin: 7.8 g/dL — ABNORMAL LOW (ref 13.0–17.0)
Immature Granulocytes: 2 %
Lymphocytes Relative: 58 %
Lymphs Abs: 0.5 10*3/uL — ABNORMAL LOW (ref 0.7–4.0)
MCH: 28 pg (ref 26.0–34.0)
MCHC: 33.8 g/dL (ref 30.0–36.0)
MCV: 82.8 fL (ref 80.0–100.0)
Monocytes Absolute: 0.2 10*3/uL (ref 0.1–1.0)
Monocytes Relative: 27 %
Neutro Abs: 0.1 10*3/uL — CL (ref 1.7–7.7)
Neutrophils Relative %: 11 %
Platelet Count: 68 10*3/uL — ABNORMAL LOW (ref 150–400)
RBC: 2.79 MIL/uL — ABNORMAL LOW (ref 4.22–5.81)
RDW: 17.2 % — ABNORMAL HIGH (ref 11.5–15.5)
Smear Review: NORMAL
WBC Count: 0.8 10*3/uL — CL (ref 4.0–10.5)
nRBC: 14.3 % — ABNORMAL HIGH (ref 0.0–0.2)

## 2023-01-10 LAB — CMP (CANCER CENTER ONLY)
ALT: 15 U/L (ref 0–44)
AST: 10 U/L — ABNORMAL LOW (ref 15–41)
Albumin: 3.7 g/dL (ref 3.5–5.0)
Alkaline Phosphatase: 57 U/L (ref 38–126)
Anion gap: 7 (ref 5–15)
BUN: 7 mg/dL (ref 6–20)
CO2: 27 mmol/L (ref 22–32)
Calcium: 9.1 mg/dL (ref 8.9–10.3)
Chloride: 103 mmol/L (ref 98–111)
Creatinine: 1.08 mg/dL (ref 0.61–1.24)
GFR, Estimated: >60 mL/min (ref >60)
Glucose, Bld: 92 mg/dL (ref 70–99)
Potassium: 3.6 mmol/L (ref 3.5–5.1)
Sodium: 137 mmol/L (ref 135–145)
Total Bilirubin: 0.7 mg/dL (ref 0.3–1.2)
Total Protein: 6.4 g/dL — ABNORMAL LOW (ref 6.5–8.1)

## 2023-01-10 LAB — URINALYSIS, COMPLETE (UACMP) WITH MICROSCOPIC
Bilirubin Urine: NEGATIVE
Glucose, UA: NEGATIVE mg/dL
Ketones, ur: NEGATIVE mg/dL
Leukocytes,Ua: NEGATIVE
Nitrite: NEGATIVE
Protein, ur: 100 mg/dL — AB
Specific Gravity, Urine: 1.012 (ref 1.005–1.030)
pH: 7 (ref 5.0–8.0)

## 2023-01-10 LAB — RESP PANEL BY RT-PCR (RSV, FLU A&B, COVID)  RVPGX2
Influenza A by PCR: NEGATIVE
Influenza B by PCR: NEGATIVE
Resp Syncytial Virus by PCR: NEGATIVE
SARS Coronavirus 2 by RT PCR: NEGATIVE

## 2023-01-10 LAB — SAMPLE TO BLOOD BANK

## 2023-01-10 MED ORDER — SODIUM CHLORIDE 0.9 % IV SOLN: 2.0000 g | Freq: Once | INTRAVENOUS | Status: DC

## 2023-01-10 MED ORDER — SODIUM CHLORIDE 0.9% FLUSH
10.0000 mL | Freq: Once | INTRAVENOUS | Status: AC
  Administered 2023-01-10: 10 mL

## 2023-01-10 MED ORDER — SODIUM CHLORIDE 0.9 % IV SOLN
2.0000 g | Freq: Three times a day (TID) | INTRAVENOUS | Status: DC
  Administered 2023-01-10 – 2023-01-13 (×8): 2 g via INTRAVENOUS
  Filled 2023-01-10 (×9): qty 12.5

## 2023-01-10 MED ORDER — BICTEGRAVIR-EMTRICITAB-TENOFOV 50-200-25 MG PO TABS
1.0000 | ORAL_TABLET | Freq: Every day | ORAL | Status: DC
  Administered 2023-01-11 – 2023-01-12 (×2): 1 via ORAL
  Filled 2023-01-10 (×4): qty 1

## 2023-01-10 MED ORDER — SODIUM CHLORIDE 0.9 % IV SOLN: Freq: Once | INTRAVENOUS | Status: AC

## 2023-01-10 MED ORDER — LACTATED RINGERS IV BOLUS
1000.0000 mL | Freq: Once | INTRAVENOUS | Status: AC
  Administered 2023-01-10: 1000 mL via INTRAVENOUS

## 2023-01-10 MED ORDER — SENNOSIDES-DOCUSATE SODIUM 8.6-50 MG PO TABS: 1.0000 | ORAL_TABLET | Freq: Every evening | ORAL | Status: DC | PRN

## 2023-01-10 MED ORDER — DEXTROSE 5 % IV SOLN
2.0000 g | Freq: Once | INTRAVENOUS | Status: AC
  Administered 2023-01-10: 2 g via INTRAVENOUS
  Filled 2023-01-10: qty 20

## 2023-01-10 MED ORDER — ACETAMINOPHEN 650 MG RE SUPP: 650.0000 mg | Freq: Four times a day (QID) | RECTAL | Status: DC | PRN

## 2023-01-10 MED ORDER — ACETAMINOPHEN 325 MG PO TABS
650.0000 mg | ORAL_TABLET | Freq: Four times a day (QID) | ORAL | Status: DC | PRN
  Administered 2023-01-11: 650 mg via ORAL
  Filled 2023-01-10: qty 2

## 2023-01-10 MED ORDER — ACETAMINOPHEN 325 MG PO TABS
650.0000 mg | ORAL_TABLET | Freq: Once | ORAL | Status: AC
  Administered 2023-01-10: 650 mg via ORAL
  Filled 2023-01-10: qty 2

## 2023-01-10 MED ORDER — POTASSIUM CHLORIDE IN NACL 20-0.9 MEQ/L-% IV SOLN
INTRAVENOUS | Status: AC
  Filled 2023-01-10 (×2): qty 1000

## 2023-01-10 MED ORDER — ONDANSETRON HCL 4 MG/2ML IJ SOLN
4.0000 mg | Freq: Four times a day (QID) | INTRAMUSCULAR | Status: DC | PRN
  Administered 2023-01-12: 4 mg via INTRAVENOUS
  Filled 2023-01-10: qty 2

## 2023-01-10 MED ORDER — ONDANSETRON HCL 4 MG PO TABS: 4.0000 mg | ORAL_TABLET | Freq: Four times a day (QID) | ORAL | Status: DC | PRN

## 2023-01-10 NOTE — Progress Notes (Signed)
CRITICAL VALUE STICKER  CRITICAL VALUE: ANC 0.1, WBC 0.8  RECEIVER (on-site recipient of call): Wanna Gully  DATE & TIME NOTIFIED: 01/10/23 243pm   MESSENGER (representative from lab):heather  MD NOTIFIED: kate walisiewicz, PA  TIME OF NOTIFICATION: 252pm  RESPONSE:  PA notified, care escalated, neutropenic fever work up initiated.

## 2023-01-10 NOTE — ED Triage Notes (Signed)
Here from the [facility] for fever and neutropenic, also endorses painful hematuria with clots. H/o Lymphoma and HIV. Arrives alert, NAD, calm, interactive, resps e/u, speaking clearly. Family at [facility]. Port accessed. Receives inpt chem with last dose last week. Blood, BCx2, urine and urine Cx sent. Rocephin and Tylenol given PTA. 1L NS IVF given. WBC0.8, Hgb 7.8, Plts 68, ANC0.1. Fever 101.3.

## 2023-01-10 NOTE — ED Provider Notes (Signed)
[facility] Provider Note   CSN: [id number] Arrival date & time: 01/10/23  1633     History  Chief Complaint  Patient presents with   Fever    Logan Ward is a 42 y.o. male with HIV/AIDS on Biktarvy, Burkitt's lymphoma, history of syphilis, headaches who presents with fever.   Per chart review, patient was seen at the cancer center this morning.  He was noted to be ill-appearing febrile and tachycardic on arrival complaining of shortness of breath.  Patient was feeling drowsy and weak starting today. Last night had nocturia/urinary frequency, dysuria, urgency. Also endorses painful hematuria with clots. Never had a UTI before. Did have unprotected sex 2-3 weeks. Hadn't had any penile discharge or pain. Also complains of DOE but that has been happening since he started chemotherapy, isn't necessarily new in the last couple of days. Receives inpt chem with last dose 12/30/2022 to 01/03/2023 and G-CSF on 01/06/2023. Called cancer center this AM d/t symptoms and they got him an appointment today. Unsure if he had a fever before but did have one when he got to the cancer center. Blood, BCx2, urine and urine Cx sent from cancer center. UA did not demonstrate any signs of infection but hematuria.  Rocephin (they did not have cefepime in clinic) and Tylenol/1L NS IVF given PTA. Labs today demonstrate WBC 0.8, Hgb 7.8, Plts 68, ANC 0.1. Fever 101.3 F.    Fever      Home Medications Prior to Admission medications   Medication Sig Start Date End Date Taking? Authorizing Provider  bictegravir-emtricitabine-tenofovir AF (BIKTARVY) 50-200-25 MG TABS tablet Take 1 tablet by mouth daily. 12/19/22   Golden Circle, FNP  dexamethasone (DECADRON) 4 MG tablet Take 1 tablet (4 mg total) by mouth 2 (two) times daily with breakfast and lunch for 2 days after completion of chemotherapy 12/14/22   Brunetta Genera, MD  lidocaine-prilocaine (EMLA) cream Apply  to affected area once Patient taking differently: Apply 1 Application topically once. 11/18/22   Brunetta Genera, MD  ondansetron (ZOFRAN) 8 MG tablet Take 1 tablet (8 mg total) by mouth every 8 (eight) hours as needed for nausea. 11/22/22   Brunetta Genera, MD  polyethylene glycol (MIRALAX / GLYCOLAX) 17 g packet Take 1 packet (17 g) by mouth daily. Patient not taking: Reported on 12/10/2022 11/23/22   Brunetta Genera, MD  prochlorperazine (COMPAZINE) 10 MG tablet Take 1 tablet (10 mg total) by mouth every 6 (six) hours as needed. Patient taking differently: Take 10 mg by mouth every 6 (six) hours as needed for nausea or vomiting. 12/02/22   Heilingoetter, Cassandra L, PA-C  senna-docusate (SENOKOT-S) 8.6-50 MG tablet Take 2 tablets by mouth at bedtime as needed for mild constipation. 12/14/22   Brunetta Genera, MD  sulfamethoxazole-trimethoprim (BACTRIM DS) 800-160 MG tablet Take 1 tablet by mouth daily. 12/19/22   Golden Circle, FNP  TYLENOL 325 MG tablet Take 325-650 mg by mouth every 8 (eight) hours as needed for mild pain or headache. Patient not taking: Reported on 12/19/2022    [provider]      Allergies    Patient has no known allergies.    Review of Systems   Review of Systems  Constitutional:  Positive for fever.   Review of systems Positive for fever, SOB.  A 10 point review of systems was performed and is negative unless otherwise reported in HPI.  Physical Exam Updated Vital  Signs BP 107/67 (BP Location: Right Arm)   Pulse (!) 105   Temp 98.9 F (37.2 C) (Oral)   Resp (!) 37   Ht 6' 1"$  (1.854 m)   Wt 125.2 kg   SpO2 95%   BMI 36.41 kg/m  Physical Exam General: Normal appearing male, lying in bed.  HEENT: PERRLA, Sclera anicteric, MMM, trachea midline.  Cardiology: RRR, no murmurs/rubs/gallops. BL radial and DP pulses equal bilaterally.  Resp: Normal respiratory rate and effort. CTAB, no wheezes, rhonchi, crackles.  Abd: Soft,  non-tender, non-distended. No rebound tenderness or guarding.  GU: Deferred. MSK: No peripheral edema or signs of trauma. Extremities without deformity or TTP. No cyanosis or clubbing. Skin: warm, dry. No rashes or lesions. Back: No CVA tenderness Neuro: A&Ox4, CNs II-XII grossly intact. MAEs. Sensation grossly intact.  Psych: Normal mood and affect.   ED Results / Procedures / Treatments   Labs (all labs ordered are listed, but only abnormal results are displayed) Labs Reviewed  RESP PANEL BY RT-PCR (RSV, FLU A&B, COVID)  [id number]    EKG None  Radiology No results found.  Procedures Procedures    Medications Ordered in ED Medications  lactated ringers bolus 1,000 mL (0 mLs Intravenous Stopped 01/10/23 1908)    ED Course/ Medical Decision Making/ A&P                          Medical Decision Making Amount and/or Complexity of Data Reviewed Labs:  Decision-making details documented in ED Course. Radiology: ordered. Decision-making details documented in ED Course.  Risk Decision regarding hospitalization.    This patient presents to the ED for concern of c/f sepsis/neutropenic fever, this involves an extensive number of treatment options, and is a complaint that carries with it a high risk of complications and morbidity.  I considered the following differential and admission for this acute, potentially life threatening condition.   MDM:    Patient with likely urinary source of infection given hematuria/urgency/frequency. W/ fever/SOB there is also c/f pneumonia or URI, will get CXR and viral panel. Also is tachycardic and was febrile at cancer center, c/f sepsis in patient who is immunocompromised and neutropenic. Already received rocephin x1. Must consider PE as well given active malignancy plus DOE however sepsis fits clinical picture better. No wheezing to suggest COPD/asthma. No CP to suggest MI and EKG w/o signs of ischemia.  UA with moderate hemoglobin and RBCs but no  signs of infection.  ANC is 0.1, WBC 0.8.  Hemoglobin is 7.8 which is slightly lower than baseline of approximately 9.  Unremarkable CMP.  Will obtain chest x-ray, viral panel to further evaluate for pneumonia, pulmonary edema, pleural effusion. UA did not demonstrate evidence of infection though patient w/ symptoms and hematuria, may not have classic UA d/t neutropenia, could still have UTI. Culture drawn.   Clinical Course as of 01/30/23 0021  Fri Jan 10, 2023  1843 Resp panel by RT-PCR (RSV, Flu A&B, Covid) Anterior Nasal Swab Neg [HN]  1843 DG Chest 2 View Underinflation with developing opacity left lung base. Possible infiltrate versus atelectasis. Recommend follow-up  Widened mediastinum with fullness in the aortopulmonary window. Please correlate for known history of abnormal lymph nodes   [HN]  1843 Giving 2nd L of IVF [HN]  1844 Pulse Rate(!): 113 [HN]    Clinical Course User Index [HN] Audley Hose, MD    Labs: I Ordered, and personally interpreted labs.  The pertinent results  include:  those listed above  Imaging Studies ordered: I ordered imaging studies including CXR I independently visualized and interpreted imaging. I agree with the radiologist interpretation  Additional history obtained from chart review.   Cardiac Monitoring: The patient was maintained on a cardiac monitor.  I personally viewed and interpreted the cardiac monitored which showed an underlying rhythm of: Sinus tachycardia  Reevaluation: After the interventions noted above, I reevaluated the patient and found that they have :stayed the same  Social Determinants of Health: Patient lives independently   Disposition:  Admission to hospitalist for neutropenic fever/sepsis unknown source, possibly cystitis  Co morbidities that complicate the patient evaluation  Past Medical History:  Diagnosis Date   Anal fissure    HIV infection ([place])    Internal hemorrhoids    Obesity    Rectal ulcer       Medicines No orders of the defined types were placed in this encounter.   I have reviewed the patients home medicines and have made adjustments as needed  Problem List / ED Course: Problem List Items Addressed This Visit   None Visit Diagnoses     Neutropenic fever ([place])    -  Primary                   This note was created using dictation software, which may contain spelling or grammatical errors.    Audley Hose, MD 01/30/23 [phone]

## 2023-01-10 NOTE — Progress Notes (Addendum)
Symptom Management Consult note [facility]    Patient Care Team: Pcp, No as PCP - General Brunetta Genera, MD as Consulting Physician (Hematology)    Name of the patient: Logan Ward  [id number]  12-Nov-1981   Date of visit: 01/10/2023   Chief Complaint/Reason for visit: painful urination   Current Therapy: daEPOCH-R with Harriett Sine  Last treatment:  inpatient chemo 01/15-01/19/2024 and G-CSF on 01/06/23   ASSESSMENT & PLAN: Patient is a 42 y.o. 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 Irene Limbo.  I have viewed most recent oncology note and lab work.   #Burkitt's Lymphoma - Next appointment with oncologist is 01/17/23  # HIV/AIDS -On Biktarvy, followed by [facility].  # Neutropenic fever -Patient ill appearing, febrile and tachycardic on arrival. Complaining of SOB, has clear lung exam.No CVA tenderness. Has tenderness to palpation of midline lumbar spine without signs of rash or abscess. Denies history of IVDA. -WBC 0.8, hemoglobin 7.8, platelets 68 K, ANC 0.1. -We do not have cefepime in clinic so patient given dose of IV Rocephin as well as 650 mg of Tylenol, and 1L NS.  Blood cultures were collected prior to antibiotic administration. -UA without obvious infection, no nitrites or leukocytes or WBC. Urine culture sent. -From clinic work up no source of infection identified. Patient will need ED evaluation for further work up and management.  - Dr. Irene Limbo notified and is agreeable with plan. -Patient taken to ED in stable condition. Report given to ED RN.     Heme/Onc History: Oncology History  Burkitt lymphoma of lymph nodes of multiple regions ([place])  11/14/2022 Initial Diagnosis   Burkitt lymphoma of lymph nodes of multiple regions ([place])   11/18/2022 -  Chemotherapy   Patient is on Treatment Plan : IP NON-HODGKINS LYMPHOMA EPOCH q21d     11/25/2022 -  Chemotherapy   Patient is on Treatment Plan :  NON-HODGKINS LYMPHOMA Rituximab q21d     12/29/2022 Cancer Staging   Staging form: Hodgkin and Non-Hodgkin Lymphoma, AJCC 8th Edition - Clinical: Stage II - Signed by Brunetta Genera, MD on 12/29/2022 Histopathologic type: Burkitt lymphoma, NOS       Interval history-: Logan Ward is a 42 y.o. male with oncologic history as above presenting to [name] today with chief complaint of painful urination x 1 day.  He is accompanied by significant other who provides additional history.  Patient started feeling bad last night.  Patient states when he woke up early this morning he noticed bright red blood in his urine and he passed a few string-like small clots.  He also had discomfort with urination, a burning sensation.  He had additional episodes of painful hematuria which prompted him to call cancer center this morning.  He denies any penile discharge, pain or swelling. He recently had inpatient chemotherapy and states when he was discharged he had some low back pain. He typically gets the back pain after the the "chemo in his back" (methotrexate given intrathecal with last dose 12/12/22).He describes the back pain as a tingling sensation. It does not radiate. He is also endorsing fatigue and shortness of breath which have been ongoing x at least 3 days.  Patient states he is short of breath walking from his bed to his bathroom.  He denies any associated chest pain. No medications taken prior to clinic arrival. Patient denies any sick contacts     ROS  All other  systems are reviewed and are negative for acute change except as noted in the HPI.    No Known Allergies   Past Medical History:  Diagnosis Date   Anal fissure    HIV infection ([place])    Internal hemorrhoids    Obesity    Rectal ulcer      Past Surgical History:  Procedure Laterality Date   IR IMAGING GUIDED PORT INSERTION  11/18/2022    Social History   Socioeconomic History   Marital status: Single    Spouse name:  Not on file   Number of children: Not on file   Years of education: Not on file   Highest education level: Not on file  Occupational History   Not on file  Tobacco Use   Smoking status: Never   Smokeless tobacco: Never  Vaping Use   Vaping Use: Never used  Substance and Sexual Activity   Alcohol use: No   Drug use: No   Sexual activity: Yes    Comment: declined condoms  Other Topics Concern   Not on file  Social History Narrative   Not on file   Social Determinants of Health   Financial Resource Strain: Low Risk  (10/24/2022)   Overall Financial Resource Strain (CARDIA)    Difficulty of Paying Living Expenses: Not hard at all  Food Insecurity: No Food Insecurity (12/30/2022)   Hunger Vital Sign    Worried About Running Out of Food in the Last Year: Never true    [place] in the Last Year: Never true  Transportation Needs: No Transportation Needs (12/30/2022)   PRAPARE - Hydrologist (Medical): No    Lack of Transportation (Non-Medical): No  Physical Activity: Sufficiently Active (10/24/2022)   Exercise Vital Sign    Days of Exercise per Week: 3 days    Minutes of Exercise per Session: 60 min  Recent Concern: Physical Activity - Insufficiently Active (10/24/2022)   Exercise Vital Sign    Days of Exercise per Week: 3 days    Minutes of Exercise per Session: 20 min  Stress: No Stress Concern Present (10/24/2022)   [place]    Feeling of Stress : Not at all  Social Connections: Unknown (10/24/2022)   Social Connection and Isolation Panel [NHANES]    Frequency of Communication with Friends and Family: Twice a week    Frequency of Social Gatherings with Friends and Family: Twice a week    Attends Religious Services: 1 to 4 times per year    Active Member of [employer] or Organizations: No    Attends Archivist Meetings: 1 to 4 times per year    Marital Status: Patient  refused  Intimate Partner Violence: Not At Risk (12/30/2022)   Humiliation, Afraid, Rape, and Kick questionnaire    Fear of Current or Ex-Partner: No    Emotionally Abused: No    Physically Abused: No    Sexually Abused: No    Family History  Problem Relation Age of Onset   Hypertension Father    Throat cancer Father    Heart attack Brother    Colon cancer Neg Hx    Esophageal cancer Neg Hx    Stomach cancer Neg Hx      Current Outpatient Medications:    bictegravir-emtricitabine-tenofovir AF (BIKTARVY) 50-200-25 MG TABS tablet, Take 1 tablet by mouth daily., Disp: 30 tablet, Rfl: 5   dexamethasone (DECADRON) 4 MG  tablet, Take 1 tablet (4 mg total) by mouth 2 (two) times daily with breakfast and lunch for 2 days after completion of chemotherapy, Disp: 30 tablet, Rfl: 0   lidocaine-prilocaine (EMLA) cream, Apply to affected area once (Patient taking differently: Apply 1 Application topically once.), Disp: 30 g, Rfl: 3   ondansetron (ZOFRAN) 8 MG tablet, Take 1 tablet (8 mg total) by mouth every 8 (eight) hours as needed for nausea., Disp: 30 tablet, Rfl: 3   polyethylene glycol (MIRALAX / GLYCOLAX) 17 g packet, Take 1 packet (17 g) by mouth daily. (Patient not taking: Reported on 12/10/2022), Disp: 14 each, Rfl: 0   prochlorperazine (COMPAZINE) 10 MG tablet, Take 1 tablet (10 mg total) by mouth every 6 (six) hours as needed. (Patient taking differently: Take 10 mg by mouth every 6 (six) hours as needed for nausea or vomiting.), Disp: 30 tablet, Rfl: 2   senna-docusate (SENOKOT-S) 8.6-50 MG tablet, Take 2 tablets by mouth at bedtime as needed for mild constipation., Disp: 60 tablet, Rfl: 1   sulfamethoxazole-trimethoprim (BACTRIM DS) 800-160 MG tablet, Take 1 tablet by mouth daily., Disp: 30 tablet, Rfl: 5   TYLENOL 325 MG tablet, Take 325-650 mg by mouth every 8 (eight) hours as needed for mild pain or headache. (Patient not taking: Reported on 12/19/2022), Disp: , Rfl:   PHYSICAL  EXAM: ECOG FS:1 - Symptomatic but completely ambulatory    Vitals:   01/10/23 1427  BP: 121/73  Pulse: (!) 103  Resp: 20  Temp: (!) 101.3 F (38.5 C)  TempSrc: Temporal  SpO2: 100%  Weight: 276 lb 9.6 oz (125.5 kg)  Height: '6\' 1"'$  (1.854 m)   Physical Exam Vitals and nursing note reviewed.  Constitutional:      Appearance: He is well-developed. He is ill-appearing and diaphoretic. He is not toxic-appearing.  HENT:     Head: Normocephalic.     Nose: Nose normal.  Eyes:     Conjunctiva/sclera: Conjunctivae normal.  Neck:     Vascular: No JVD.  Cardiovascular:     Rate and Rhythm: Regular rhythm. Tachycardia present.     Pulses: Normal pulses.     Heart sounds: Normal heart sounds.  Pulmonary:     Effort: Pulmonary effort is normal.     Breath sounds: Normal breath sounds.  Chest:     Comments: PAC without surrounding tenderness. No overlying erythema Abdominal:     General: Bowel sounds are normal. There is no distension.     Palpations: Abdomen is soft. There is no mass.     Tenderness: There is no abdominal tenderness. There is no right CVA tenderness, left CVA tenderness, guarding or rebound.     Hernia: No hernia is present.  Musculoskeletal:     Cervical back: Normal range of motion.       Back:  Skin:    General: Skin is warm.  Neurological:     Mental Status: He is oriented to person, place, and time.        LABORATORY DATA: I have reviewed the data as listed    Latest Ref Rng & Units 01/10/2023    1:52 PM 01/03/2023    5:00 AM 01/02/2023    5:25 AM  CBC  WBC 4.0 - 10.5 K/uL 0.8  2.2  3.4   Hemoglobin 13.0 - 17.0 g/dL 7.8  9.1  9.1   Hematocrit 39.0 - 52.0 % 23.1  28.4  29.0   Platelets 150 - 400 K/uL 68  315  289         Latest Ref Rng & Units 01/10/2023    1:52 PM 01/03/2023    5:00 AM 01/02/2023    5:25 AM  CMP  Glucose 70 - 99 mg/dL 92  104  113   BUN 6 - 20 mg/dL '7  14  14   '$ Creatinine 0.61 - 1.24 mg/dL 1.08  0.99  1.00   Sodium 135 -  145 mmol/L 137  134  134   Potassium 3.5 - 5.1 mmol/L 3.6  3.9  4.1   Chloride 98 - 111 mmol/L 103  100  103   CO2 22 - 32 mmol/L '27  24  24   '$ Calcium 8.9 - 10.3 mg/dL 9.1  8.8  8.7   Total Protein 6.5 - 8.1 g/dL 6.4  6.7  6.6   Total Bilirubin 0.3 - 1.2 mg/dL 0.7  1.2  1.4   Alkaline Phos 38 - 126 U/L 57  39  43   AST 15 - 41 U/L '10  26  26   '$ ALT 0 - 44 U/L '15  28  20        '$ RADIOGRAPHIC STUDIES (from last 24 hours if applicable) I have personally reviewed the radiological images as listed and agreed with the findings in the report. No results found.      Visit Diagnosis: 1. Burkitt lymphoma of lymph nodes of multiple regions ([place])   2. Neutropenic fever ([place])   3. Other pancytopenia ([place])      No orders of the defined types were placed in this encounter.   All questions were answered.No barriers to learning was detected.  I have spent a total of 30 minutes minutes of face-to-face and non-face-to-face time, preparing to see the patient, obtaining and/or reviewing separately obtained history, performing a medically appropriate examination, counseling and educating the patient, ordering tests, documenting clinical information in the electronic health record, and care coordination (communications with other health care professionals or caregivers).    Thank you for allowing me to participate in the care of this patient.    Barrie Folk, PA-C Department of Hematology/Oncology [facility] at [facility] Phone: [phone]  [phone]    01/10/2023 5:14 PM

## 2023-01-10 NOTE — Progress Notes (Signed)
Labs entered for [facility] visit.  

## 2023-01-10 NOTE — Progress Notes (Signed)
Pharmacy Antibiotic Note  Logan Ward is a 41 y.o. male admitted on 01/10/2023 with  febrile neutropenia .  Pharmacy has been consulted for cefepime dosing.  Plan: Cefepime 2 g IV every 8 hours Pharmacy will sign off consult as any needed renal dose adjustments will be made per Antimicrobial Dosing Guidelines protocol and continue to follow clinical progress and culture results.   Height: '6\' 1"'$  (185.4 cm) Weight: 125.2 kg (276 lb) IBW/kg (Calculated) : 79.9  Temp (24hrs), Avg:100.2 F (37.9 C), Min:98.9 F (37.2 C), Max:101.3 F (38.5 C)  Recent Labs  Lab 01/10/23 1352  WBC 0.8*  CREATININE 1.08    Estimated Creatinine Clearance: 127.3 mL/min (by C-G formula based on SCr of 1.08 mg/dL).    No Known Allergies  Antimicrobials this admission: 1/26 cefepime >>   Microbiology results: 1/26 BCx: pending 1/26 UCx: sent    Thank you for allowing pharmacy to be a part of this patient's care.  Suzzanne Cloud, PharmD, BCPS 01/10/2023 9:18 PM

## 2023-01-10 NOTE — ED Notes (Signed)
Lab called to add GC/Chlamydia to urine in lab sent previously by [facility] just prior to ED visit.

## 2023-01-10 NOTE — ED Notes (Signed)
EDP at [facility] 

## 2023-01-10 NOTE — ED Notes (Signed)
Pt to xray, alert, NAD, calm, no changes.

## 2023-01-10 NOTE — H&P (Signed)
PCP:   Pcp, No   Chief Complaint:  Burning urination  HPI: This is a 42 y/o AA male diagnosed with HIV disease in December 2020.  He additionally has a recent diagnosis of Burkitt's lymphoma, currently undergoing chemotherapy.  He just completed his third round of chemotherapy on Friday.  He presents to hospital today after having developed burning with urination since last night.  He reports hematuria, urgency, frequency.  He denies fevers or chills, nausea or vomiting.  Reminded to do patient quite short of breath.  When asked he states he is always short of breath when irritated.  In the ER, patient found to be febrile at 101.3, tachycardic at 103 [pulse rate is at 130], and tachypneic as high as 29.  Blood pressures remain normal.  Patient white blood count is 0.8, ANC 0.1, urine did not show evidence of infection except for rare bacteria.  Admission requested for neutropenic, immunocompromise patient with fever.  Review of Systems:  The patient denies anorexia, fever, weight loss,, vision loss, decreased hearing, hoarseness, chest pain, syncope, dyspnea on exertion, peripheral edema, balance deficits, hemoptysis, abdominal pain, melena, hematochezia, severe indigestion/heartburn, hematuria, incontinence, genital sores, muscle weakness, suspicious skin lesions, transient blindness, difficulty walking, depression, unusual weight change, abnormal bleeding, enlarged lymph nodes, angioedema, and breast masses. Positives: Burning urination  Past Medical History: Past Medical History:  Diagnosis Date   Anal fissure    HIV infection ([place])    Internal hemorrhoids    Obesity    Rectal ulcer    Past Surgical History:  Procedure Laterality Date   IR IMAGING GUIDED PORT INSERTION  11/18/2022    Medications: Prior to Admission medications   Medication Sig Start Date End Date Taking? Authorizing Provider  bictegravir-emtricitabine-tenofovir AF (BIKTARVY) 50-200-25 MG TABS tablet Take 1 tablet by  mouth daily. 12/19/22   Golden Circle, FNP  dexamethasone (DECADRON) 4 MG tablet Take 1 tablet (4 mg total) by mouth 2 (two) times daily with breakfast and lunch for 2 days after completion of chemotherapy 12/14/22   Brunetta Genera, MD  lidocaine-prilocaine (EMLA) cream Apply to affected area once Patient taking differently: Apply 1 Application topically once. 11/18/22   Brunetta Genera, MD  ondansetron (ZOFRAN) 8 MG tablet Take 1 tablet (8 mg total) by mouth every 8 (eight) hours as needed for nausea. 11/22/22   Brunetta Genera, MD  polyethylene glycol (MIRALAX / GLYCOLAX) 17 g packet Take 1 packet (17 g) by mouth daily. Patient not taking: Reported on 12/10/2022 11/23/22   Brunetta Genera, MD  prochlorperazine (COMPAZINE) 10 MG tablet Take 1 tablet (10 mg total) by mouth every 6 (six) hours as needed. Patient taking differently: Take 10 mg by mouth every 6 (six) hours as needed for nausea or vomiting. 12/02/22   Heilingoetter, Cassandra L, PA-C  senna-docusate (SENOKOT-S) 8.6-50 MG tablet Take 2 tablets by mouth at bedtime as needed for mild constipation. 12/14/22   Brunetta Genera, MD  sulfamethoxazole-trimethoprim (BACTRIM DS) 800-160 MG tablet Take 1 tablet by mouth daily. 12/19/22   Golden Circle, FNP  TYLENOL 325 MG tablet Take 325-650 mg by mouth every 8 (eight) hours as needed for mild pain or headache. Patient not taking: Reported on 12/19/2022    [provider]    Allergies:  No Known Allergies  Social History:  reports that he has never smoked. He has never used smokeless tobacco. He reports that he does not drink alcohol and does not use drugs.  Family History: Family History  Problem Relation Age of Onset   Hypertension Father    Throat cancer Father    Heart attack Brother    Colon cancer Neg Hx    Esophageal cancer Neg Hx    Stomach cancer Neg Hx     Physical Exam: Vitals:   01/10/23 1837 01/10/23 1945 01/10/23 2100 01/10/23 2158   BP: 115/70 126/70 111/74   Pulse: (!) 113 (!) 116 (!) 127   Resp: (!) 35 17 18   Temp:    98.8 F (37.1 C)  TempSrc:    Oral  SpO2: 93% 96% 93%   Weight:      Height:        General:  Alert and oriented times three, well developed and nourished, no acute distress Eyes: PERRLA, pink conjunctiva, no scleral icterus ENT: Moist oral mucosa, neck supple, no thyromegaly Lungs: clear to ascultation, no wheeze, no crackles, no use of accessory muscles Cardiovascular: regular rate and rhythm, no regurgitation, no gallops, no murmurs. No carotid bruits, no JVD Abdomen: soft, positive BS, non-tender, non-distended, no organomegaly, not an acute abdomen GU: not examined Neuro: CN II - XII grossly intact, sensation intact Musculoskeletal: strength 5/5 all extremities, no clubbing, cyanosis or edema Skin: no rash, no subcutaneous crepitation, no decubitus Psych: appropriate patient   Labs on Admission:  Recent Labs    01/10/23 1352  NA 137  K 3.6  CL 103  CO2 27  GLUCOSE 92  BUN 7  CREATININE 1.08  CALCIUM 9.1   Recent Labs    01/10/23 1352  AST 10*  ALT 15  ALKPHOS 57  BILITOT 0.7  PROT 6.4*  ALBUMIN 3.7    Recent Labs    01/10/23 1352  WBC 0.8*  NEUTROABS 0.1*  HGB 7.8*  HCT 23.1*  MCV 82.8  PLT 68*     Micro Results: Recent Results (from the past 240 hour(s))  Culture, blood (single) w Reflex to ID Panel     Status: None (Preliminary result)   Collection Time: 01/10/23  4:00 PM   Specimen: BLOOD  Result Value Ref Range Status   Specimen Description BLOOD RIGHT ANTECUBITAL  Final   Special Requests   Final    BOTTLES DRAWN AEROBIC AND ANAEROBIC Blood Culture results may not be optimal due to an excessive volume of blood received in culture bottles Performed at [facility], [address] [place], [place] [address]    Culture PENDING  Incomplete   Report Status PENDING  Incomplete  Resp panel by RT-PCR (RSV, Flu A&B, Covid) Anterior Nasal Swab      Status: None   Collection Time: 01/10/23  5:43 PM   Specimen: Anterior Nasal Swab  Result Value Ref Range Status   SARS Coronavirus 2 by RT PCR NEGATIVE NEGATIVE Final    Comment: (NOTE) SARS-CoV-2 target nucleic acids are NOT DETECTED.  The SARS-CoV-2 RNA is generally detectable in upper respiratory specimens during the acute phase of infection. The lowest concentration of SARS-CoV-2 viral copies this assay can detect is 138 copies/mL. A negative result does not preclude SARS-Cov-2 infection and should not be used as the sole basis for treatment or other patient management decisions. A negative result may occur with  improper specimen collection/handling, submission of specimen other than nasopharyngeal swab, presence of viral mutation(s) within the areas targeted by this assay, and inadequate number of viral copies(<138 copies/mL). A negative result must be combined with clinical observations, patient history, and epidemiological  information. The expected result is Negative.  Fact Sheet for Patients:  EntrepreneurPulse.com.au  Fact Sheet for Healthcare Providers:  IncredibleEmployment.be  This test is no t yet approved or cleared by the [place] FDA and  has been authorized for detection and/or diagnosis of SARS-CoV-2 by FDA under an Emergency Use Authorization (EUA). This EUA will remain  in effect (meaning this test can be used) for the duration of the COVID-19 declaration under Section 564(b)(1) of the Act, 21 U.S.C.section 360bbb-3(b)(1), unless the authorization is terminated  or revoked sooner.       Influenza A by PCR NEGATIVE NEGATIVE Final   Influenza B by PCR NEGATIVE NEGATIVE Final    Comment: (NOTE) The Xpert Xpress SARS-CoV-2/FLU/RSV plus assay is intended as an aid in the diagnosis of influenza from Nasopharyngeal swab specimens and should not be used as a sole basis for treatment. Nasal washings and aspirates are  unacceptable for Xpert Xpress SARS-CoV-2/FLU/RSV testing.  Fact Sheet for Patients: EntrepreneurPulse.com.au  Fact Sheet for Healthcare Providers: IncredibleEmployment.be  This test is not yet approved or cleared by the [place] FDA and has been authorized for detection and/or diagnosis of SARS-CoV-2 by FDA under an Emergency Use Authorization (EUA). This EUA will remain in effect (meaning this test can be used) for the duration of the COVID-19 declaration under Section 564(b)(1) of the Act, 21 U.S.C. section 360bbb-3(b)(1), unless the authorization is terminated or revoked.     Resp Syncytial Virus by PCR NEGATIVE NEGATIVE Final    Comment: (NOTE) Fact Sheet for Patients: EntrepreneurPulse.com.au  Fact Sheet for Healthcare Providers: IncredibleEmployment.be  This test is not yet approved or cleared by the [place] FDA and has been authorized for detection and/or diagnosis of SARS-CoV-2 by FDA under an Emergency Use Authorization (EUA). This EUA will remain in effect (meaning this test can be used) for the duration of the COVID-19 declaration under Section 564(b)(1) of the Act, 21 U.S.C. section 360bbb-3(b)(1), unless the authorization is terminated or revoked.  Performed at [facility], [place] [address], [place], [place] [address]      Radiological Exams on Admission: DG Chest 2 View  Result Date: 01/10/2023 CLINICAL DATA:  Shortness of breath and fever EXAM: CHEST - 2 VIEW COMPARISON:  Chest x-ray and CT 11/10/2022. FINDINGS: Left IJ chest port with tip at the SVC right atrial junction. Underinflation. Developing mild opacity at the left lung base. Slight asymmetric vascular congestion at the left hilum. No pneumothorax or effusion. There is large mint of the mediastinum particularly along the aortopulmonary window. Please correlate for any known history including abnormal  nodes as seen on CT scan of 11/10/2022. This is more prominent than the prior x-ray IMPRESSION: Chest port. Underinflation with developing opacity left lung base. Possible infiltrate versus atelectasis. Recommend follow-up Widened mediastinum with fullness in the aortopulmonary window. Please correlate for known history of abnormal lymph nodes Electronically Signed   By: Jill Side M.D.   On: 01/10/2023 18:26    Assessment/Plan Present on Admission:  Neutropenic fever (HCC)/Immunocompromised patient Sepsis likely secondary to acute cystitis Possible developing PNA -Admit to medsurg -Blood and urine cultures collected -Urinalysis looks benign with rare bacteria but given patient's immunocompromise status.  He been not be able to mount a defense  against infection.  Start patient cefepime, pharmacy to dose -Patient hemodynamically stable  AIDs/burkitts lymphoma -Continue Biktarvy and Bactrim -last viral load of 54,600 and CD4 count of 115. 12/16/24 -has Burkitt's lymphoma, currently undergoing chemotherapy..  Just completed  CDC Stage 3.    Katlynn Naser 01/10/2023, 10:20 PM

## 2023-01-11 DIAGNOSIS — R319 Hematuria, unspecified: Secondary | ICD-10-CM

## 2023-01-11 DIAGNOSIS — N3001 Acute cystitis with hematuria: Secondary | ICD-10-CM

## 2023-01-11 LAB — BASIC METABOLIC PANEL
Anion gap: 9 (ref 5–15)
BUN: 8 mg/dL (ref 6–20)
CO2: 24 mmol/L (ref 22–32)
Calcium: 8.7 mg/dL — ABNORMAL LOW (ref 8.9–10.3)
Chloride: 105 mmol/L (ref 98–111)
Creatinine, Ser: 1.11 mg/dL (ref 0.61–1.24)
GFR, Estimated: >60 mL/min (ref >60)
Glucose, Bld: 111 mg/dL — ABNORMAL HIGH (ref 70–99)
Potassium: 4.1 mmol/L (ref 3.5–5.1)
Sodium: 138 mmol/L (ref 135–145)

## 2023-01-11 LAB — MAGNESIUM: Magnesium: 1.4 mg/dL — ABNORMAL LOW (ref 1.7–2.4)

## 2023-01-11 LAB — CBC WITH DIFFERENTIAL/PLATELET
Abs Immature Granulocytes: 0.2 10*3/uL — ABNORMAL HIGH (ref 0.00–0.07)
Basophils Absolute: 0 10*3/uL (ref 0.0–0.1)
Basophils Relative: 0 %
Eosinophils Absolute: 0 10*3/uL (ref 0.0–0.5)
Eosinophils Relative: 0 %
HCT: 23.3 % — ABNORMAL LOW (ref 39.0–52.0)
Hemoglobin: 7.4 g/dL — ABNORMAL LOW (ref 13.0–17.0)
Lymphocytes Relative: 68 %
Lymphs Abs: 0.9 10*3/uL (ref 0.7–4.0)
MCH: 27.1 pg (ref 26.0–34.0)
MCHC: 31.8 g/dL (ref 30.0–36.0)
MCV: 85.3 fL (ref 80.0–100.0)
Monocytes Absolute: 0.2 10*3/uL (ref 0.1–1.0)
Monocytes Relative: 16 %
Myelocytes: 12 %
Neutro Abs: 0.1 10*3/uL — CL (ref 1.7–7.7)
Neutrophils Relative %: 4 %
Platelets: 50 10*3/uL — ABNORMAL LOW (ref 150–400)
RBC: 2.73 MIL/uL — ABNORMAL LOW (ref 4.22–5.81)
RDW: 17.8 % — ABNORMAL HIGH (ref 11.5–15.5)
WBC: 1.3 10*3/uL — CL (ref 4.0–10.5)
nRBC: 35.4 % — ABNORMAL HIGH (ref 0.0–0.2)
nRBC: 36 /100 WBC — ABNORMAL HIGH

## 2023-01-11 LAB — URINE CULTURE: Culture: NO GROWTH

## 2023-01-11 MED ORDER — MAGIC MOUTHWASH W/LIDOCAINE
5.0000 mL | Freq: Four times a day (QID) | ORAL | Status: DC | PRN
  Administered 2023-01-11: 5 mL via ORAL
  Filled 2023-01-11 (×2): qty 5

## 2023-01-11 MED ORDER — SODIUM CHLORIDE 0.9 % IV SOLN: INTRAVENOUS | Status: DC

## 2023-01-11 MED ORDER — MAGNESIUM SULFATE 2 GM/50ML IV SOLN
2.0000 g | Freq: Once | INTRAVENOUS | Status: AC
  Administered 2023-01-11: 2 g via INTRAVENOUS
  Filled 2023-01-11: qty 50

## 2023-01-11 NOTE — Progress Notes (Signed)
PROGRESS NOTE  Logan Ward  YWV:[id number] DOB: 31-Dec-1980 DOA: 01/10/2023 PCP: Pcp, No   Brief Narrative: Patient is a 42 year old male with HIV, Burkitt's lymphoma currently on chemotherapy status post completion of third round of chemo on last Friday presented with complaint of dysuria, fever.  On presentation he was febrile, tachycardic, tachypneic.  CBC showed WBC count of 0.8, ANC of 0.1.  Patient was admitted for neutropenic fever.  He was started on cefepime.  Assessment & Plan:  Principal Problem:   Acute cystitis Active Problems:   AIDS (acquired immune deficiency syndrome) ([place])   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[place])   Neutropenic fever ([place])   Pancytopenia ([place])   Hematuria    Sepsis with possible UTI: Presented with dysuria, hematuria.  Fever on presentation.  Follow-up cultures.  Started empirically on cefepime. He denies any dysuria today.  Urine looks clear.  Neutropenic fever: Low ANC.  Secondary to chemotherapy.Was given Udenyca for neutropenia on 01/06/2023 show will hold on Granix.  Goal ANC of 1000 prior to discharge.  Pancytopenia: Secondary to chemotherapy.  Continue to monitor.  Has thrombocytopenia.  Hemoglobin in the range of 7  History of Burkitt's lymphoma: Follows with oncology.  Status post chemo, cycle 3 of EPOCH-R  History of HIV: On Biktarvy, Bactrim.  Last viral load of 54,600 and CD4 of 115 as per 12/16/2024.Follows with ID.   Hypomagnesemia: Supplemented with magnesium  Obesity: BMI 36.4      DVT prophylaxis:SCDs Start: 01/10/23 2057     Code Status: Full Code  Family Communication: Mother at bedside  Patient status: Inpatient  Patient is from : Home  Anticipated discharge to: Home  Estimated DC date: 2 to 3 days   Consultants: Oncology  Procedures: None  Antimicrobials:  Anti-infectives (From admission, onward)    Start     Dose/Rate Route Frequency Ordered Stop   01/11/23 1000  bictegravir-emtricitabine-tenofovir AF (BIKTARVY)  50-200-25 MG per tablet 1 tablet        1 tablet Oral Daily 01/10/23 2221     01/10/23 2200  ceFEPIme (MAXIPIME) 2 g in sodium chloride 0.9 % 100 mL IVPB        2 g 200 mL/hr over 30 Minutes Intravenous Every 8 hours 01/10/23 2058         Subjective: Seen and examined at bedside today.  Hemodynamically stable.  Comfortable.  Afebrile during my evaluation.  Denies dysuria.  Alert and oriented.  Family at bedside.  Objective: Vitals:   01/11/23 0211 01/11/23 0245 01/11/23 0330 01/11/23 0633  BP:  102/60  112/64  Pulse:  (!) 112 98 (!) 101  Resp:  (!) 29 (!) 30 16  Temp: 99.9 F (37.7 C)   98.4 F (36.9 C)  TempSrc: Oral   Oral  SpO2:  91% (!) 88% 94%  Weight:      Height:        Intake/Output Summary (Last 24 hours) at 01/11/2023 0816 Last data filed at 01/10/2023 1908 Gross per 24 hour  Intake 2100 ml  Output 200 ml  Net 1900 ml   Filed Weights   01/10/23 1659  Weight: 125.2 kg    Examination:  General exam: Overall comfortable, not in distress, obese HEENT: PERRL Respiratory system:  no wheezes or crackles  Cardiovascular system: S1 & S2 heard, RRR.  Chemo-Port on the left chest Gastrointestinal system: Abdomen is nondistended, soft and nontender. Central nervous system: Alert and oriented Extremities: No edema, no clubbing ,no cyanosis Skin: No rashes, no ulcers,no  icterus     Data Reviewed: I have personally reviewed following labs and imaging studies  CBC: Recent Labs  Lab 01/10/23 1352 01/11/23 0538  WBC 0.8* 1.3*  NEUTROABS 0.1* 0.1*  HGB 7.8* 7.4*  HCT 23.1* 23.3*  MCV 82.8 85.3  PLT 68* 50*   Basic Metabolic Panel: Recent Labs  Lab 01/10/23 1352 01/11/23 0538  NA 137 138  K 3.6 4.1  CL 103 105  CO2 27 24  GLUCOSE 92 111*  BUN 7 8  CREATININE 1.08 1.11  CALCIUM 9.1 8.7*  MG  --  1.4*     Recent Results (from the past 240 hour(s))  Culture, blood (single) w Reflex to ID Panel     Status: None (Preliminary result)   Collection  Time: 01/10/23  4:00 PM   Specimen: BLOOD  Result Value Ref Range Status   Specimen Description BLOOD RIGHT ANTECUBITAL  Final   Special Requests   Final    BOTTLES DRAWN AEROBIC AND ANAEROBIC Blood Culture results may not be optimal due to an excessive volume of blood received in culture bottles   Culture   Final    NO GROWTH < 24 HOURS Performed at [facility], [place] [address] [place], [place] [address]    Report Status PENDING  Incomplete  Resp panel by RT-PCR (RSV, Flu A&B, Covid) Anterior Nasal Swab     Status: None   Collection Time: 01/10/23  5:43 PM   Specimen: Anterior Nasal Swab  Result Value Ref Range Status   SARS Coronavirus 2 by RT PCR NEGATIVE NEGATIVE Final    Comment: (NOTE) SARS-CoV-2 target nucleic acids are NOT DETECTED.  The SARS-CoV-2 RNA is generally detectable in upper respiratory specimens during the acute phase of infection. The lowest concentration of SARS-CoV-2 viral copies this assay can detect is 138 copies/mL. A negative result does not preclude SARS-Cov-2 infection and should not be used as the sole basis for treatment or other patient management decisions. A negative result may occur with  improper specimen collection/handling, submission of specimen other than nasopharyngeal swab, presence of viral mutation(s) within the areas targeted by this assay, and inadequate number of viral copies(<138 copies/mL). A negative result must be combined with clinical observations, patient history, and epidemiological information. The expected result is Negative.  Fact Sheet for Patients:  EntrepreneurPulse.com.au  Fact Sheet for Healthcare Providers:  IncredibleEmployment.be  This test is no t yet approved or cleared by the [place] FDA and  has been authorized for detection and/or diagnosis of SARS-CoV-2 by FDA under an Emergency Use Authorization (EUA). This EUA will remain  in effect (meaning this test can  be used) for the duration of the COVID-19 declaration under Section 564(b)(1) of the Act, 21 U.S.C.section 360bbb-3(b)(1), unless the authorization is terminated  or revoked sooner.       Influenza A by PCR NEGATIVE NEGATIVE Final   Influenza B by PCR NEGATIVE NEGATIVE Final    Comment: (NOTE) The Xpert Xpress SARS-CoV-2/FLU/RSV plus assay is intended as an aid in the diagnosis of influenza from Nasopharyngeal swab specimens and should not be used as a sole basis for treatment. Nasal washings and aspirates are unacceptable for Xpert Xpress SARS-CoV-2/FLU/RSV testing.  Fact Sheet for Patients: EntrepreneurPulse.com.au  Fact Sheet for Healthcare Providers: IncredibleEmployment.be  This test is not yet approved or cleared by the [place] FDA and has been authorized for detection and/or diagnosis of SARS-CoV-2 by FDA under an Emergency Use Authorization (EUA). This EUA will  remain in effect (meaning this test can be used) for the duration of the COVID-19 declaration under Section 564(b)(1) of the Act, 21 U.S.C. section 360bbb-3(b)(1), unless the authorization is terminated or revoked.     Resp Syncytial Virus by PCR NEGATIVE NEGATIVE Final    Comment: (NOTE) Fact Sheet for Patients: EntrepreneurPulse.com.au  Fact Sheet for Healthcare Providers: IncredibleEmployment.be  This test is not yet approved or cleared by the [place] FDA and has been authorized for detection and/or diagnosis of SARS-CoV-2 by FDA under an Emergency Use Authorization (EUA). This EUA will remain in effect (meaning this test can be used) for the duration of the COVID-19 declaration under Section 564(b)(1) of the Act, 21 U.S.C. section 360bbb-3(b)(1), unless the authorization is terminated or revoked.  Performed at [facility], [place] [address] [place], [place] [address]      Radiology Studies: DG  Chest 2 View  Result Date: 01/10/2023 CLINICAL DATA:  Shortness of breath and fever EXAM: CHEST - 2 VIEW COMPARISON:  Chest x-ray and CT 11/10/2022. FINDINGS: Left IJ chest port with tip at the SVC right atrial junction. Underinflation. Developing mild opacity at the left lung base. Slight asymmetric vascular congestion at the left hilum. No pneumothorax or effusion. There is large mint of the mediastinum particularly along the aortopulmonary window. Please correlate for any known history including abnormal nodes as seen on CT scan of 11/10/2022. This is more prominent than the prior x-ray IMPRESSION: Chest port. Underinflation with developing opacity left lung base. Possible infiltrate versus atelectasis. Recommend follow-up Widened mediastinum with fullness in the aortopulmonary window. Please correlate for known history of abnormal lymph nodes Electronically Signed   By: Jill Side M.D.   On: 01/10/2023 18:26    Scheduled Meds:  bictegravir-emtricitabine-tenofovir AF  1 tablet Oral Daily   Continuous Infusions:  0.9 % NaCl with KCl 20 mEq / L 100 mL/hr at 01/10/23 2340   ceFEPime (MAXIPIME) IV Stopped (01/11/23 8264)   magnesium sulfate bolus IVPB       LOS: 1 day   Shelly Coss, MD [facility] P1/27/2024, 8:16 AM

## 2023-01-11 NOTE — ED Notes (Addendum)
ED TO INPATIENT HANDOFF REPORT  ED Nurse Name and Phone #: Vaneta Hammontree Coolidge Breeze, EMT-P  S Name/Age/Gender Logan Ward 42 y.o. male Room/Bed: WA24/WA24  Code Status   Code Status: Full Code  Home/SNF/Other  Patient oriented to: self, place, time, and situation Is this baseline? Yes   Triage Complete: Triage complete  Chief Complaint Neutropenic fever ([place]) [D70.9, R50.81]  Triage Note Here from the [facility] for fever and neutropenic, also endorses painful hematuria with clots. H/o Lymphoma and HIV. Arrives alert, NAD, calm, interactive, resps e/u, speaking clearly. Family at [facility] Port accessed. Receives inpt chem with last dose last week. Blood, BCx2, urine and urine Cx sent. Rocephin and Tylenol given PTA. 1L NS IVF given. WBC0.8, Hgb 7.8, Plts 68, ANC0.1. Fever 101.3.    Allergies No Known Allergies  Level of Care/Admitting Diagnosis ED Disposition     ED Disposition  Admit   Condition  --   Comment  Hospital Area: [facility] [[id number]]  Level of Care: Med-Surg [16]  May admit patient to [facility] or [facility] if equivalent level of care is available:: Yes  Covid Evaluation: Confirmed COVID Negative  Diagnosis: Neutropenic fever ([place]) [762831]  Admitting Physician: Quintella Baton [[id number]]  Attending Physician: Quintella Baton [[id number]]  Certification:: I certify this patient will need inpatient services for at least 2 midnights  Estimated Length of Stay: 2          B Medical/Surgery History Past Medical History:  Diagnosis Date   Anal fissure    HIV infection ([place])    Internal hemorrhoids    Obesity    Rectal ulcer    Past Surgical History:  Procedure Laterality Date   IR IMAGING GUIDED PORT INSERTION  11/18/2022     A IV Location/Drains/Wounds Patient Lines/Drains/Airways Status     Active Line/Drains/Airways     Name Placement date Placement time Site Days   Implanted Port 11/18/22 Left Chest 11/18/22  1118  Chest  54             Intake/Output Last 24 hours  Intake/Output Summary (Last 24 hours) at 01/11/2023 1607 Last data filed at 01/10/2023 1908 Gross per 24 hour  Intake 2100 ml  Output 200 ml  Net 1900 ml    Labs/Imaging Results for orders placed or performed during the hospital encounter of 01/10/23 (from the past 48 hour(s))  Resp panel by RT-PCR (RSV, Flu A&B, Covid) Anterior Nasal Swab     Status: None   Collection Time: 01/10/23  5:43 PM   Specimen: Anterior Nasal Swab  Result Value Ref Range   SARS Coronavirus 2 by RT PCR NEGATIVE NEGATIVE    Comment: (NOTE) SARS-CoV-2 target nucleic acids are NOT DETECTED.  The SARS-CoV-2 RNA is generally detectable in upper respiratory specimens during the acute phase of infection. The lowest concentration of SARS-CoV-2 viral copies this assay can detect is 138 copies/mL. A negative result does not preclude SARS-Cov-2 infection and should not be used as the sole basis for treatment or other patient management decisions. A negative result may occur with  improper specimen collection/handling, submission of specimen other than nasopharyngeal swab, presence of viral mutation(s) within the areas targeted by this assay, and inadequate number of viral copies(<138 copies/mL). A negative result must be combined with clinical observations, patient history, and epidemiological information. The expected result is Negative.  Fact Sheet for Patients:  EntrepreneurPulse.com.au  Fact Sheet for Healthcare Providers:  IncredibleEmployment.be  This test is no t yet  approved or cleared by the [place] and  has been authorized for detection and/or diagnosis of SARS-CoV-2 by FDA under an Emergency Use Authorization (EUA). This EUA will remain  in effect (meaning this test can be used) for the duration of the COVID-19 declaration under Section 564(b)(1) of the Act, 21 U.S.C.section 360bbb-3(b)(1), unless the authorization  is terminated  or revoked sooner.       Influenza A by PCR NEGATIVE NEGATIVE   Influenza B by PCR NEGATIVE NEGATIVE    Comment: (NOTE) The Xpert Xpress SARS-CoV-2/FLU/RSV plus assay is intended as an aid in the diagnosis of influenza from Nasopharyngeal swab specimens and should not be used as a sole basis for treatment. Nasal washings and aspirates are unacceptable for Xpert Xpress SARS-CoV-2/FLU/RSV testing.  Fact Sheet for Patients: EntrepreneurPulse.com.au  Fact Sheet for Healthcare Providers: IncredibleEmployment.be  This test is not yet approved or cleared by the [place] FDA and has been authorized for detection and/or diagnosis of SARS-CoV-2 by FDA under an Emergency Use Authorization (EUA). This EUA will remain in effect (meaning this test can be used) for the duration of the COVID-19 declaration under Section 564(b)(1) of the Act, 21 U.S.C. section 360bbb-3(b)(1), unless the authorization is terminated or revoked.     Resp Syncytial Virus by PCR NEGATIVE NEGATIVE    Comment: (NOTE) Fact Sheet for Patients: EntrepreneurPulse.com.au  Fact Sheet for Healthcare Providers: IncredibleEmployment.be  This test is not yet approved or cleared by the [place] FDA and has been authorized for detection and/or diagnosis of SARS-CoV-2 by FDA under an Emergency Use Authorization (EUA). This EUA will remain in effect (meaning this test can be used) for the duration of the COVID-19 declaration under Section 564(b)(1) of the Act, 21 U.S.C. section 360bbb-3(b)(1), unless the authorization is terminated or revoked.  Performed at [facility], [place] [address], [place], [place] [address]   Basic metabolic panel     Status: Abnormal   Collection Time: 01/11/23  5:38 AM  Result Value Ref Range   Sodium 138 135 - 145 mmol/L   Potassium 4.1 3.5 - 5.1 mmol/L   Chloride 105 98 - 111  mmol/L   CO2 24 22 - 32 mmol/L   Glucose, Bld 111 (H) 70 - 99 mg/dL    Comment: Glucose reference range applies only to samples taken after fasting for at least 8 hours.   BUN 8 6 - 20 mg/dL   Creatinine, Ser 1.11 0.61 - 1.24 mg/dL   Calcium 8.7 (L) 8.9 - 10.3 mg/dL   GFR, Estimated >60 >60 mL/min    Comment: (NOTE) Calculated using the CKD-EPI Creatinine Equation (2021)    Anion gap 9 5 - 15    Comment: Performed at [facility], [place] [address], [place], [place] [address]  Magnesium     Status: Abnormal   Collection Time: 01/11/23  5:38 AM  Result Value Ref Range   Magnesium 1.4 (L) 1.7 - 2.4 mg/dL    Comment: Performed at [facility], [place] [address], [place],  [address]  CBC with Differential/Platelet     Status: Abnormal   Collection Time: 01/11/23  5:38 AM  Result Value Ref Range   WBC 1.3 (LL) 4.0 - 10.5 K/uL    Comment: REPEATED TO VERIFY WHITE COUNT CONFIRMED ON SMEAR THIS CRITICAL RESULT HAS VERIFIED AND BEEN CALLED TO CARTER,S. BY SEEL,MOLLY ON 01 27 2024 AT 0630, AND HAS BEEN READ BACK.     RBC  2.73 (L) 4.22 - 5.81 MIL/uL   Hemoglobin 7.4 (L) 13.0 - 17.0 g/dL   HCT 23.3 (L) 39.0 - 52.0 %   MCV 85.3 80.0 - 100.0 fL   MCH 27.1 26.0 - 34.0 pg   MCHC 31.8 30.0 - 36.0 g/dL   RDW 17.8 (H) 11.5 - 15.5 %   Platelets 50 (L) 150 - 400 K/uL    Comment: SPECIMEN CHECKED FOR CLOTS PLATELET COUNT CONFIRMED BY SMEAR    nRBC 35.4 (H) 0.0 - 0.2 %   Neutrophils Relative % 4 %   Neutro Abs 0.1 (LL) 1.7 - 7.7 K/uL    Comment: This critical result has verified and been called to CARTER,S. by SEEL,MOLLY on 01 27 2024 at 0630, and has been read back.    Lymphocytes Relative 68 %   Lymphs Abs 0.9 0.7 - 4.0 K/uL   Monocytes Relative 16 %   Monocytes Absolute 0.2 0.1 - 1.0 K/uL   Eosinophils Relative 0 %   Eosinophils Absolute 0.0 0.0 - 0.5 K/uL   Basophils Relative 0 %   Basophils Absolute 0.0 0.0 - 0.1 K/uL   nRBC 36 (H) 0 /100 WBC    Myelocytes 12 %   Abs Immature Granulocytes 0.20 (H) 0.00 - 0.07 K/uL   Tear Drop Cells PRESENT    Polychromasia PRESENT     Comment: Performed at [facility], [place] [address], [place], [place] [address]   DG Chest 2 View  Result Date: 01/10/2023 CLINICAL DATA:  Shortness of breath and fever EXAM: CHEST - 2 VIEW COMPARISON:  Chest x-ray and CT 11/10/2022. FINDINGS: Left IJ chest port with tip at the SVC right atrial junction. Underinflation. Developing mild opacity at the left lung base. Slight asymmetric vascular congestion at the left hilum. No pneumothorax or effusion. There is large mint of the mediastinum particularly along the aortopulmonary window. Please correlate for any known history including abnormal nodes as seen on CT scan of 11/10/2022. This is more prominent than the prior x-ray IMPRESSION: Chest port. Underinflation with developing opacity left lung base. Possible infiltrate versus atelectasis. Recommend follow-up Widened mediastinum with fullness in the aortopulmonary window. Please correlate for known history of abnormal lymph nodes Electronically Signed   By: Jill Side M.D.   On: 01/10/2023 18:26    Pending Labs Unresulted Labs (From admission, onward)     Start     Ordered   01/12/23 0500  CBC  Tomorrow morning,   R        01/11/23 0815   01/12/23 0500  Magnesium  Tomorrow morning,   R        01/11/23 0815   01/11/23 0823  Urine Culture (for pregnant, neutropenic or urologic patients or patients with an indwelling urinary catheter)  (Urine Labs)  Once,   R       Question:  Indication  Answer:  Sepsis   01/11/23 0822            Vitals/Pain Today's Vitals   01/11/23 0245 01/11/23 0330 01/11/23 0525 01/11/23 0633  BP: 102/60   112/64  Pulse: (!) 112 98  (!) 101  Resp: (!) 29 (!) 30  16  Temp:    98.4 F (36.9 C)  TempSrc:    Oral  SpO2: 91% (!) 88%  94%  Weight:      Height:      PainSc:   Asleep 0-No pain    Isolation Precautions No  active isolations  Medications  Medications  acetaminophen (TYLENOL) tablet 650 mg (650 mg Oral Given 01/11/23 0215)    Or  acetaminophen (TYLENOL) suppository 650 mg ( Rectal See Alternative 01/11/23 0215)  senna-docusate (Senokot-S) tablet 1 tablet (has no administration in time range)  ondansetron (ZOFRAN) tablet 4 mg (has no administration in time range)    Or  ondansetron (ZOFRAN) injection 4 mg (has no administration in time range)  ceFEPIme (MAXIPIME) 2 g in sodium chloride 0.9 % 100 mL IVPB (0 g Intravenous Stopped 01/11/23 0626)  bictegravir-emtricitabine-tenofovir AF (BIKTARVY) 50-200-25 MG per tablet 1 tablet (has no administration in time range)  0.9 % NaCl with KCl 20 mEq/ L  infusion ( Intravenous New Bag/Given 01/10/23 2340)  magnesium sulfate IVPB 2 g 50 mL (has no administration in time range)  lactated ringers bolus 1,000 mL (0 mLs Intravenous Stopped 01/10/23 1908)    Mobility walks     Focused Assessments    R Recommendations: See Admitting Provider Note  Report given to: Meredith Pel   Additional Notes:

## 2023-01-11 NOTE — Progress Notes (Signed)
Yellow MEWS protocol triggered r/t tachycardia. Pt in no acute distress. Will continue to monitor.   01/11/23 1346  Assess: MEWS Score  Temp 99.5 F (37.5 C)  BP 110/60  Pulse Rate (!) 116  Resp 20  SpO2 95 %  Assess: MEWS Score  MEWS Temp 0  MEWS Systolic 0  MEWS Pulse 2  MEWS RR 0  MEWS LOC 0  MEWS Score 2  MEWS Score Color Yellow  Assess: if the MEWS score is Yellow or Red  Were vital signs taken at a resting state? Yes  Focused Assessment No change from prior assessment  Does the patient meet 2 or more of the SIRS criteria? No  MEWS guidelines implemented *See Row Information* Yes  Treat  MEWS Interventions Administered scheduled meds/treatments  Pain Scale 0-10  Pain Score 0  Take Vital Signs  Increase Vital Sign Frequency  Yellow: Q 2hr X 2 then Q 4hr X 2, if remains yellow, continue Q 4hrs  Escalate  MEWS: Escalate Yellow: discuss with charge nurse/RN and consider discussing with provider and RRT  Notify: Charge Nurse/RN  Name of Charge Nurse/RN Notified Kim RN  Date Charge Nurse/RN Notified 01/11/23  Time Charge Nurse/RN Notified 1456  Document  Patient Outcome Stabilized after interventions  Progress note created (see row info) Yes  Assess: SIRS CRITERIA  SIRS Temperature  0  SIRS Pulse 1  SIRS Respirations  0  SIRS WBC 1  SIRS Score Sum  2   MEWS Guidelines - (patients age 40 and over)

## 2023-01-11 NOTE — Progress Notes (Signed)
[facility]  HEMATOLOGY/ONCOLOGY INPATIENT PROGRESS NOTE  Date of Service:01/10/2023  Inpatient Attending: .Quintella Baton, MD   SUBJECTIVE  Patient is well-known to me from the oncology clinic.  Recent diagnosis of Burkitt's lymphoma status post cycle 3 of dose adjusted EPOCH-R chemotherapy.  He did receive Udenyca on 01/06/2023. He presented to the oncology symptom management clinic with hematuria and dysuria today and labs showed significant neutropenia from anemia and thrombocytopenia.  He was noted to be febrile and was given a dose of ceftriaxone and IV fluids and went to the emergency room for further evaluation and management of neutropenic fever with possible sepsis from a urinary source.  He notes some intermittent chills.  When seen in the emergency room the patient notes that his hematuria is no longer apparent and his gross hematuria has resolved.  He is not sure if he might have passed a stone.  Did have some intermittent transient back pains. He did receive cefepime in the emergency room.    OBJECTIVE:  NAD  PHYSICAL EXAMINATION: . Vitals:   01/10/23 1837 01/10/23 1945 01/10/23 2100 01/10/23 2158  BP: 115/70 126/70 111/74   Pulse: (!) 113 (!) 116 (!) 127   Resp: (!) 35 17 18   Temp:    98.8 F (37.1 C)  TempSrc:    Oral  SpO2: 93% 96% 93%   Weight:      Height:       Filed Weights   01/10/23 1659  Weight: 276 lb (125.2 kg)   .Body mass index is 36.41 kg/m.  GENERAL:alert, in no acute distress and comfortable SKIN: skin color, texture, turgor are normal, no rashes or significant lesions EYES: normal, conjunctiva are pink and non-injected, sclera clear OROPHARYNX:no exudate, no erythema and lips, buccal mucosa, and tongue normal  NECK: supple, no JVD, thyroid normal size, non-tender, without nodularity LYMPH:  no palpable lymphadenopathy in the cervical, axillary or inguinal LUNGS: clear to auscultation with normal respiratory effort HEART: regular rate & rhythm,  no  murmurs and no lower extremity edema ABDOMEN: abdomen soft, non-tender, normoactive bowel sounds  Musculoskeletal: no cyanosis of digits and no clubbing  PSYCH: alert & oriented x 3 with fluent speech NEURO: no focal motor/sensory deficits  MEDICAL HISTORY:  Past Medical History:  Diagnosis Date   Anal fissure    HIV infection ([place])    Internal hemorrhoids    Obesity    Rectal ulcer     SURGICAL HISTORY: Past Surgical History:  Procedure Laterality Date   IR IMAGING GUIDED PORT INSERTION  11/18/2022    SOCIAL HISTORY: Social History   Socioeconomic History   Marital status: Single    Spouse name: Not on file   Number of children: Not on file   Years of education: Not on file   Highest education level: Not on file  Occupational History   Not on file  Tobacco Use   Smoking status: Never   Smokeless tobacco: Never  Vaping Use   Vaping Use: Never used  Substance and Sexual Activity   Alcohol use: No   Drug use: No   Sexual activity: Yes    Comment: declined condoms  Other Topics Concern   Not on file  Social History Narrative   Not on file   Social Determinants of Health   Financial Resource Strain: Low Risk  (10/24/2022)   Overall Financial Resource Strain (CARDIA)    Difficulty of Paying Living Expenses: Not hard at all  Food Insecurity: No Food Insecurity (12/30/2022)  Hunger Vital Sign    Worried About Running Out of Food in the Last Year: Never true    Ran Out of Food in the Last Year: Never true  Transportation Needs: No Transportation Needs (12/30/2022)   PRAPARE - Hydrologist (Medical): No    Lack of Transportation (Non-Medical): No  Physical Activity: Sufficiently Active (10/24/2022)   Exercise Vital Sign    Days of Exercise per Week: 3 days    Minutes of Exercise per Session: 60 min  Recent Concern: Physical Activity - Insufficiently Active (10/24/2022)   Exercise Vital Sign    Days of Exercise per Week: 3 days     Minutes of Exercise per Session: 20 min  Stress: No Stress Concern Present (10/24/2022)   [place] of Stress : Not at all  Social Connections: Unknown (10/24/2022)   Social Connection and Isolation Panel [NHANES]    Frequency of Communication with Friends and Family: Twice a week    Frequency of Social Gatherings with Friends and Family: Twice a week    Attends Religious Services: 1 to 4 times per year    Active Member of [employer] or Organizations: No    Attends Archivist Meetings: 1 to 4 times per year    Marital Status: Patient refused  Intimate Partner Violence: Not At Risk (12/30/2022)   Humiliation, Afraid, Rape, and Kick questionnaire    Fear of Current or Ex-Partner: No    Emotionally Abused: No    Physically Abused: No    Sexually Abused: No    FAMILY HISTORY: Family History  Problem Relation Age of Onset   Hypertension Father    Throat cancer Father    Heart attack Brother    Colon cancer Neg Hx    Esophageal cancer Neg Hx    Stomach cancer Neg Hx     ALLERGIES:  has No Known Allergies.  MEDICATIONS:  Scheduled Meds:  bictegravir-emtricitabine-tenofovir AF  1 tablet Oral Daily   Continuous Infusions:  0.9 % NaCl with KCl 20 mEq / L 100 mL/hr at 01/10/23 2340   ceFEPime (MAXIPIME) IV 2 g (01/10/23 2156)   PRN Meds:.acetaminophen **OR** acetaminophen, ondansetron **OR** ondansetron (ZOFRAN) IV, senna-docusate  REVIEW OF SYSTEMS:    10 Point review of Systems was done is negative except as noted above.   LABORATORY DATA:  I have reviewed the data as listed  .    Latest Ref Rng & Units 01/10/2023    1:52 PM 01/03/2023    5:00 AM 01/02/2023    5:25 AM  CBC  WBC 4.0 - 10.5 K/uL 0.8  2.2  3.4   Hemoglobin 13.0 - 17.0 g/dL 7.8  9.1  9.1   Hematocrit 39.0 - 52.0 % 23.1  28.4  29.0   Platelets 150 - 400 K/uL 68  315  289    ANC 100 .    Latest Ref Rng & Units 01/10/2023     1:52 PM 01/03/2023    5:00 AM 01/02/2023    5:25 AM  CMP  Glucose 70 - 99 mg/dL 92  104  113   BUN 6 - 20 mg/dL '7  14  14   '$ Creatinine 0.61 - 1.24 mg/dL 1.08  0.99  1.00   Sodium 135 - 145 mmol/L 137  134  134   Potassium 3.5 - 5.1 mmol/L 3.6  3.9  4.1   Chloride 98 -  111 mmol/L 103  100  103   CO2 22 - 32 mmol/L '27  24  24   '$ Calcium 8.9 - 10.3 mg/dL 9.1  8.8  8.7   Total Protein 6.5 - 8.1 g/dL 6.4  6.7  6.6   Total Bilirubin 0.3 - 1.2 mg/dL 0.7  1.2  1.4   Alkaline Phos 38 - 126 U/L 57  39  43   AST 15 - 41 U/L '10  26  26   '$ ALT 0 - 44 U/L '15  28  20      '$ RADIOGRAPHIC STUDIES: I have personally reviewed the radiological images as listed and agreed with the findings in the report. DG Chest 2 View  Result Date: 01/10/2023 CLINICAL DATA:  Shortness of breath and fever EXAM: CHEST - 2 VIEW COMPARISON:  Chest x-ray and CT 11/10/2022. FINDINGS: Left IJ chest port with tip at the SVC right atrial junction. Underinflation. Developing mild opacity at the left lung base. Slight asymmetric vascular congestion at the left hilum. No pneumothorax or effusion. There is large mint of the mediastinum particularly along the aortopulmonary window. Please correlate for any known history including abnormal nodes as seen on CT scan of 11/10/2022. This is more prominent than the prior x-ray IMPRESSION: Chest port. Underinflation with developing opacity left lung base. Possible infiltrate versus atelectasis. Recommend follow-up Widened mediastinum with fullness in the aortopulmonary window. Please correlate for known history of abnormal lymph nodes Electronically Signed   By: Jill Side M.D.   On: 01/10/2023 18:26   DG FL GUIDED LUMBAR PUNCTURE  Result Date: 12/12/2022 CLINICAL DATA:  Burkitt's lymphoma.  HIV positive. EXAM: FLUOROSCOPICALLY GUIDED LUMBAR PUNCTURE FOR INTRATHECAL CHEMOTHERAPY FLUOROSCOPY: Radiation Exposure Index (as provided by the fluoroscopic device): 13.3 mGy Kerma PROCEDURE: Informed  consent was obtained from the patient prior to the procedure, including potential complications of headache, allergy, and pain. With the patient prone, the lower back was prepped with Betadine. 1% Lidocaine was used for local anesthesia. Lumbar puncture was performed at the L3-4 level using a gauge needle with return of clear colorlessCSF. CSF sample delivered to the laboratory. Pharmacy prepared methotrexate was injected into the subarachnoid space. The patient tolerated the procedure well without apparent complication. IMPRESSION: Fluoroscopically guided lumbar puncture for intrathecal chemotherapy administration. Successful lumbar puncture and injection of intrathecal methotrexate. Electronically Signed   By: Franchot Gallo M.D.   On: 12/12/2022 11:03   DG FLUORO GUIDED LOC OF NEEDLE/CATH TIP FOR SPINAL INJECT LT  Result Date: 12/12/2022 CLINICAL DATA:  Burkitt's lymphoma.  HIV positive. EXAM: FLUOROSCOPICALLY GUIDED LUMBAR PUNCTURE FOR INTRATHECAL CHEMOTHERAPY FLUOROSCOPY: Radiation Exposure Index (as provided by the fluoroscopic device): 13.3 mGy Kerma PROCEDURE: Informed consent was obtained from the patient prior to the procedure, including potential complications of headache, allergy, and pain. With the patient prone, the lower back was prepped with Betadine. 1% Lidocaine was used for local anesthesia. Lumbar puncture was performed at the L3-4 level using a gauge needle with return of clear colorlessCSF. CSF sample delivered to the laboratory. Pharmacy prepared methotrexate was injected into the subarachnoid space. The patient tolerated the procedure well without apparent complication. IMPRESSION: Fluoroscopically guided lumbar puncture for intrathecal chemotherapy administration. Successful lumbar puncture and injection of intrathecal methotrexate. Electronically Signed   By: Franchot Gallo M.D.   On: 12/12/2022 11:03    ASSESSMENT & PLAN:   42 year old male with HIV/AIDS with newly diagnosed  Burkitt's lymphoma   #1  Stage II recently diagnosed Burkitt's lymphoma Presenting with  rapidly growing right neck mass extending into the chest, bilateral x-ray lymphadenopathy and also concern for possible involvement of retroperitoneal lymph nodes. Concerning for at least stage II possibly stage III disease. Some borderline lymphadenopathy in the abdomen could also be from his recently diagnosed HIV/AIDS. No clinical symptoms suggestive of CNS involvement at this time. Brain MRI negative.  No constitutional symptoms. No significant cytopenias to suggest bone marrow involvement. Discussed with patient and he is not interested in fertility preservation or sperm banking. First presented in August at [facility] and was treated empirically with steroids and antibiotics with improvement in swelling prior to additional progression. -Currently on dose adjusted EPOCH-R. Status post 1 cycle. Tolerated well overall.  -Scheduled for cycle #2 on 12/10/22. Will help facilitate inpatient admission. -Outpatient follow up on 12/17/22 for Rituxan and Neulasta   #2 HIV/AIDS recently diagnosed 3 to 4 months ago. Following Dr. Elna Breslow from [place] and currently taking Biktarvy.   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  Patient reports this was completely treated.   #4 pancytopenia likely due to chemotherapy.  Accentuated by the patient's underlying HIV/AIDS, antiretroviral therapy and Bactrim .  #5 severe neutropenia with fever.  Likely urinary tract infection given patient's hematuria and dysuria though it is also possible that he may have passed a kidney stone.  PLAN: -Patient admitted for neutropenic fever after his third cycle of dose adjusted EPOCH-R chemotherapy. -Plan septic workup in process.  Patient clinically has urinary symptoms suggestive of urinary tract infection. -Has been started on cefepime for neutropenic fever. -Has already received Udenyca to help with his neutropenia on  01/06/2023.  This likely represents his nadir and he should start bouncing his WBC counts in the next 2 to 4 days. -No clear indication for additional G-CSF at this time -Follow-up on urine culture and blood culture and adjust antibiotics accordingly. -No indication for platelet transfusion at this time. -PRBC transfusion as needed for hemoglobin less than 7.5 -Appreciate hospital medicine cares -Will plan to see the patient on Monday if still in the hospital. -Patient has to be fever free and have an ANC of more than 1000 prior to discharge  The total time spent in the appointment was 36 minutes*.  All of the patient's questions were answered with apparent satisfaction. The patient knows to call the clinic with any problems, questions or concerns.   Sullivan Lone MD MS AAHIVMS [facility] Hematology/Oncology Physician [facility]  .*Total Encounter Time as defined by the Centers for Medicare and Medicaid Services includes, in addition to the face-to-face time of a patient visit (documented in the note above) non-face-to-face time: obtaining and reviewing outside history, ordering and reviewing medications, tests or procedures, care coordination (communications with other health care professionals or caregivers) and documentation in the medical record.

## 2023-01-12 DIAGNOSIS — N3001 Acute cystitis with hematuria: Secondary | ICD-10-CM | POA: Diagnosis not present

## 2023-01-12 LAB — BASIC METABOLIC PANEL
Anion gap: 8 (ref 5–15)
BUN: 7 mg/dL (ref 6–20)
CO2: 23 mmol/L (ref 22–32)
Calcium: 7.8 mg/dL — ABNORMAL LOW (ref 8.9–10.3)
Chloride: 106 mmol/L (ref 98–111)
Creatinine, Ser: 1.14 mg/dL (ref 0.61–1.24)
GFR, Estimated: >60 mL/min (ref >60)
Glucose, Bld: 106 mg/dL — ABNORMAL HIGH (ref 70–99)
Potassium: 3.6 mmol/L (ref 3.5–5.1)
Sodium: 137 mmol/L (ref 135–145)

## 2023-01-12 LAB — CBC WITH DIFFERENTIAL/PLATELET
Abs Immature Granulocytes: 0.68 10*3/uL — ABNORMAL HIGH (ref 0.00–0.07)
Basophils Absolute: 0 10*3/uL (ref 0.0–0.1)
Basophils Relative: 0 %
Eosinophils Absolute: 0 10*3/uL (ref 0.0–0.5)
Eosinophils Relative: 0 %
HCT: 22.8 % — ABNORMAL LOW (ref 39.0–52.0)
Hemoglobin: 7.3 g/dL — ABNORMAL LOW (ref 13.0–17.0)
Immature Granulocytes: 11 %
Lymphocytes Relative: 11 %
Lymphs Abs: 0.7 10*3/uL (ref 0.7–4.0)
MCH: 27.5 pg (ref 26.0–34.0)
MCHC: 32 g/dL (ref 30.0–36.0)
MCV: 86 fL (ref 80.0–100.0)
Monocytes Absolute: 1.4 10*3/uL — ABNORMAL HIGH (ref 0.1–1.0)
Monocytes Relative: 23 %
Neutro Abs: 3.4 10*3/uL (ref 1.7–7.7)
Neutrophils Relative %: 55 %
Platelets: 60 10*3/uL — ABNORMAL LOW (ref 150–400)
RBC: 2.65 MIL/uL — ABNORMAL LOW (ref 4.22–5.81)
RDW: 18.8 % — ABNORMAL HIGH (ref 11.5–15.5)
WBC: 6.2 10*3/uL (ref 4.0–10.5)
nRBC: 10.2 % — ABNORMAL HIGH (ref 0.0–0.2)

## 2023-01-12 LAB — URINE CULTURE: Culture: NO GROWTH

## 2023-01-12 LAB — MAGNESIUM: Magnesium: 2.1 mg/dL (ref 1.7–2.4)

## 2023-01-12 LAB — TSH: TSH: 1.231 u[IU]/mL (ref 0.350–4.500)

## 2023-01-12 MED ORDER — MELATONIN 3 MG PO TABS
6.0000 mg | ORAL_TABLET | Freq: Once | ORAL | Status: AC
  Administered 2023-01-12: 6 mg via ORAL
  Filled 2023-01-12: qty 2

## 2023-01-12 MED ORDER — SODIUM BICARBONATE/SODIUM CHLORIDE MOUTHWASH
OROMUCOSAL | Status: DC | PRN
  Filled 2023-01-12: qty 1000

## 2023-01-12 MED ORDER — HYDROCORTISONE (PERIANAL) 2.5 % EX CREA
TOPICAL_CREAM | Freq: Three times a day (TID) | CUTANEOUS | Status: DC
  Filled 2023-01-12: qty 28.35

## 2023-01-12 MED ORDER — POLYETHYLENE GLYCOL 3350 17 G PO PACK
17.0000 g | PACK | Freq: Every day | ORAL | Status: DC
  Filled 2023-01-12 (×2): qty 1

## 2023-01-12 NOTE — Plan of Care (Signed)
  Problem: Education: Goal: Knowledge of the prescribed therapeutic regimen will improve Outcome: Progressing   Problem: Education: Goal: Knowledge of General Education information will improve Description: Including pain rating scale, medication(s)/side effects and non-pharmacologic comfort measures Outcome: Progressing   Problem: Activity: Goal: Risk for activity intolerance will decrease Outcome: Progressing   Problem: Nutrition: Goal: Adequate nutrition will be maintained Outcome: Progressing   Problem: Coping: Goal: Level of anxiety will decrease Outcome: Progressing   Problem: Pain Managment: Goal: General experience of comfort will improve Outcome: Progressing   Problem: Safety: Goal: Ability to remain free from injury will improve Outcome: Progressing

## 2023-01-12 NOTE — Progress Notes (Signed)
PROGRESS NOTE  Logan Ward  PYK:[id number] DOB: 22-Jun-1981 DOA: 01/10/2023 PCP: Pcp, No   Brief Narrative: Patient is a 42 year old male with HIV, Burkitt's lymphoma currently on chemotherapy status post completion of third round of chemo on last Friday presented with complaint of dysuria, fever.  On presentation he was febrile, tachycardic, tachypneic.  CBC showed WBC count of 0.8, ANC of 0.1.  Patient was admitted for neutropenic fever.  He was started on cefepime.  Assessment & Plan:  Principal Problem:   Acute cystitis Active Problems:   AIDS (acquired immune deficiency syndrome) ([place])   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[place])   Neutropenic fever ([place])   Pancytopenia ([place])   Hematuria    Sepsis : Presented with dysuria, hematuria.  Fever on presentation.  Follow-up cultures.  Started empirically on cefepime. He denies any dysuria ,urine looks clear. Urine culture sent, results pending.  Looks like the blood culture sent in the ED has been canceled.  Patient is afebrile today. But remains sinus tachycardia.Checking TSH  Neutropenic fever: Low ANC.  Secondary to chemotherapy.Was given Udenyca for neutropenia on 01/06/2023 show will hold on Granix.  Goal ANC of 1000 prior to discharge.  Pancytopenia: Secondary to chemotherapy.  Continue to monitor.  Has thrombocytopenia.  Hemoglobin in the range of 7.  No evidence of acute blood loss.  This is most likely secondary to lymphoma, chemotherapy  History of Burkitt's lymphoma: Follows with oncology.  Status post chemo, cycle 3 of EPOCH-R  History of HIV: On Biktarvy, Bactrim.  Last viral load of 54,600 and CD4 of 115 as per 12/16/2024.Follows with ID.   Hypomagnesemia: Supplemented with magnesium  Obesity: BMI 36.4      DVT prophylaxis:SCDs Start: 01/10/23 2057     Code Status: Full Code  Family Communication: Mother at bedside on 1/27  Patient status: Inpatient  Patient is from : Home  Anticipated discharge to: Home  Estimated DC  date: 1-2 days   Consultants: Oncology  Procedures: None  Antimicrobials:  Anti-infectives (From admission, onward)    Start     Dose/Rate Route Frequency Ordered Stop   01/11/23 1000  bictegravir-emtricitabine-tenofovir AF (BIKTARVY) 50-200-25 MG per tablet 1 tablet        1 tablet Oral Daily 01/10/23 2221     01/10/23 2200  ceFEPIme (MAXIPIME) 2 g in sodium chloride 0.9 % 100 mL IVPB        2 g 200 mL/hr over 30 Minutes Intravenous Every 8 hours 01/10/23 2058         Subjective: Patient seen and examined at bedside today.  Very comfortable, sitting in the chair.  Had a temperature of 99 F early this morning.  Denies any complaints.  No nausea, vomiting or abdominal pain.  Still in mild sinus tachycardia.  He desperately wants to go home. We discussed about importance of getting the heart rate under control before discharge, continue IV fluids and antibiotics.   objective: Vitals:   01/11/23 2124 01/12/23 0520 01/12/23 0525 01/12/23 0942  BP: 100/70 (!) 116/56 (!) 116/56 126/78  Pulse:  (!) 108 (!) 108 (!) 108  Resp:  (!) 28 (!) 28 (!) 24  Temp:  99 F (37.2 C) 99 F (37.2 C) 98.9 F (37.2 C)  TempSrc:  Oral Oral Oral  SpO2:   100% 99%  Weight:      Height:        Intake/Output Summary (Last 24 hours) at 01/12/2023 1136 Last data filed at 01/12/2023 0806 Gross per 24 hour  Intake 3152.89 ml  Output 2000 ml  Net 1152.89 ml   Filed Weights   01/10/23 1659  Weight: 125.2 kg    Examination:  General exam: Overall comfortable, not in distress,obese HEENT: PERRL Respiratory system:  no wheezes or crackles  Cardiovascular system: S1 & S2 heard, RRR. Chemo port on the left chest Gastrointestinal system: Abdomen is nondistended, soft and nontender. Central nervous system: Alert and oriented Extremities: No edema, no clubbing ,no cyanosis Skin: No rashes, no ulcers,no icterus     Data Reviewed: I have personally reviewed following labs and imaging  studies  CBC: Recent Labs  Lab 01/10/23 1352 01/11/23 0538  WBC 0.8* 1.3*  NEUTROABS 0.1* 0.1*  HGB 7.8* 7.4*  HCT 23.1* 23.3*  MCV 82.8 85.3  PLT 68* 50*   Basic Metabolic Panel: Recent Labs  Lab 01/10/23 1352 01/11/23 0538 01/12/23 0630  NA 137 138 137  K 3.6 4.1 3.6  CL 103 105 106  CO2 '27 24 23  '$ GLUCOSE 92 111* 106*  BUN '7 8 7  '$ CREATININE 1.08 1.11 1.14  CALCIUM 9.1 8.7* 7.8*  MG  --  1.4* 2.1     Recent Results (from the past 240 hour(s))  Urine Culture     Status: None   Collection Time: 01/10/23  1:52 PM   Specimen: Urine, Clean Catch  Result Value Ref Range Status   Specimen Description   Final    URINE, CLEAN CATCH Performed at [facility], [address] [place], [place] [address]    Special Requests   Final    URINE, CLEAN CATCH Performed at [facility], [place] [address], [place], [place] [address]    Culture   Final    NO GROWTH Performed at [facility], [place] [address], [place], [place] [address]    Report Status 01/11/2023 FINAL  Final  Culture, blood (single) w Reflex to ID Panel     Status: None (Preliminary result)   Collection Time: 01/10/23  4:00 PM   Specimen: BLOOD  Result Value Ref Range Status   Specimen Description BLOOD RIGHT ANTECUBITAL  Final   Special Requests   Final    BOTTLES DRAWN AEROBIC AND ANAEROBIC Blood Culture results may not be optimal due to an excessive volume of blood received in culture bottles   Culture   Final    NO GROWTH 2 DAYS Performed at [facility], [place] [address], [place], [place] [address]    Report Status PENDING  Incomplete  Resp panel by RT-PCR (RSV, Flu A&B, Covid) Anterior Nasal Swab     Status: None   Collection Time: 01/10/23  5:43 PM   Specimen: Anterior Nasal Swab  Result Value Ref Range Status   SARS Coronavirus 2 by RT PCR NEGATIVE NEGATIVE Final    Comment: (NOTE) SARS-CoV-2 target nucleic acids are NOT DETECTED.  The  SARS-CoV-2 RNA is generally detectable in upper respiratory specimens during the acute phase of infection. The lowest concentration of SARS-CoV-2 viral copies this assay can detect is 138 copies/mL. A negative result does not preclude SARS-Cov-2 infection and should not be used as the sole basis for treatment or other patient management decisions. A negative result may occur with  improper specimen collection/handling, submission of specimen other than nasopharyngeal swab, presence of viral mutation(s) within the areas targeted by this assay, and inadequate number of viral copies(<138 copies/mL). A negative result must be combined with clinical observations, patient history,  and epidemiological information. The expected result is Negative.  Fact Sheet for Patients:  EntrepreneurPulse.com.au  Fact Sheet for Healthcare Providers:  IncredibleEmployment.be  This test is no t yet approved or cleared by the [place] FDA and  has been authorized for detection and/or diagnosis of SARS-CoV-2 by FDA under an Emergency Use Authorization (EUA). This EUA will remain  in effect (meaning this test can be used) for the duration of the COVID-19 declaration under Section 564(b)(1) of the Act, 21 U.S.C.section 360bbb-3(b)(1), unless the authorization is terminated  or revoked sooner.       Influenza A by PCR NEGATIVE NEGATIVE Final   Influenza B by PCR NEGATIVE NEGATIVE Final    Comment: (NOTE) The Xpert Xpress SARS-CoV-2/FLU/RSV plus assay is intended as an aid in the diagnosis of influenza from Nasopharyngeal swab specimens and should not be used as a sole basis for treatment. Nasal washings and aspirates are unacceptable for Xpert Xpress SARS-CoV-2/FLU/RSV testing.  Fact Sheet for Patients: EntrepreneurPulse.com.au  Fact Sheet for Healthcare Providers: IncredibleEmployment.be  This test is not yet approved or  cleared by the [place] FDA and has been authorized for detection and/or diagnosis of SARS-CoV-2 by FDA under an Emergency Use Authorization (EUA). This EUA will remain in effect (meaning this test can be used) for the duration of the COVID-19 declaration under Section 564(b)(1) of the Act, 21 U.S.C. section 360bbb-3(b)(1), unless the authorization is terminated or revoked.     Resp Syncytial Virus by PCR NEGATIVE NEGATIVE Final    Comment: (NOTE) Fact Sheet for Patients: EntrepreneurPulse.com.au  Fact Sheet for Healthcare Providers: IncredibleEmployment.be  This test is not yet approved or cleared by the [place] FDA and has been authorized for detection and/or diagnosis of SARS-CoV-2 by FDA under an Emergency Use Authorization (EUA). This EUA will remain in effect (meaning this test can be used) for the duration of the COVID-19 declaration under Section 564(b)(1) of the Act, 21 U.S.C. section 360bbb-3(b)(1), unless the authorization is terminated or revoked.  Performed at [facility], [place] [address], [place], [place] [address]      Radiology Studies: DG Chest 2 View  Result Date: 01/10/2023 CLINICAL DATA:  Shortness of breath and fever EXAM: CHEST - 2 VIEW COMPARISON:  Chest x-ray and CT 11/10/2022. FINDINGS: Left IJ chest port with tip at the SVC right atrial junction. Underinflation. Developing mild opacity at the left lung base. Slight asymmetric vascular congestion at the left hilum. No pneumothorax or effusion. There is large mint of the mediastinum particularly along the aortopulmonary window. Please correlate for any known history including abnormal nodes as seen on CT scan of 11/10/2022. This is more prominent than the prior x-ray IMPRESSION: Chest port. Underinflation with developing opacity left lung base. Possible infiltrate versus atelectasis. Recommend follow-up Widened mediastinum with fullness in the  aortopulmonary window. Please correlate for known history of abnormal lymph nodes Electronically Signed   By: Jill Side M.D.   On: 01/10/2023 18:26    Scheduled Meds:  bictegravir-emtricitabine-tenofovir AF  1 tablet Oral Daily   Continuous Infusions:  sodium chloride 100 mL/hr at 01/12/23 0900   ceFEPime (MAXIPIME) IV 2 g (01/12/23 0630)     LOS: 2 days   Shelly Coss, MD [facility] P1/28/2024, 11:36 AM

## 2023-01-13 ENCOUNTER — Other Ambulatory Visit (HOSPITAL_COMMUNITY): Payer: Self-pay

## 2023-01-13 ENCOUNTER — Encounter: Payer: Self-pay | Admitting: Hematology

## 2023-01-13 DIAGNOSIS — N3001 Acute cystitis with hematuria: Secondary | ICD-10-CM | POA: Diagnosis not present

## 2023-01-13 LAB — GC/CHLAMYDIA PROBE AMP (~~LOC~~) NOT AT ARMC
Chlamydia: NEGATIVE
Comment: NEGATIVE
Comment: NORMAL
Neisseria Gonorrhea: NEGATIVE

## 2023-01-13 MED ORDER — AMOXICILLIN-POT CLAVULANATE 875-125 MG PO TABS
1.0000 | ORAL_TABLET | Freq: Two times a day (BID) | ORAL | Status: DC
  Administered 2023-01-13: 1 via ORAL
  Filled 2023-01-13: qty 1

## 2023-01-13 MED ORDER — AMOXICILLIN-POT CLAVULANATE 875-125 MG PO TABS
1.0000 | ORAL_TABLET | Freq: Two times a day (BID) | ORAL | 0 refills | Status: DC
  Filled 2023-01-13: qty 8, 4d supply, fill #0

## 2023-01-13 MED ORDER — NYSTATIN 100000 UNIT/ML MT SUSP
5.0000 mL | Freq: Four times a day (QID) | OROMUCOSAL | 0 refills | Status: DC | PRN
  Filled 2023-01-13 (×2): qty 50, 3d supply, fill #0

## 2023-01-13 MED ORDER — SODIUM BICARBONATE/SODIUM CHLORIDE MOUTHWASH
1.0000 | OROMUCOSAL | 0 refills | Status: DC | PRN
  Filled 2023-01-13: qty 100, fill #0

## 2023-01-13 MED ORDER — HEPARIN SOD (PORK) LOCK FLUSH 100 UNIT/ML IV SOLN
500.0000 [IU] | INTRAVENOUS | Status: AC | PRN
  Administered 2023-01-13: 500 [IU]

## 2023-01-13 MED ORDER — HEPARIN SOD (PORK) LOCK FLUSH 100 UNIT/ML IV SOLN
INTRAVENOUS | Status: AC
  Filled 2023-01-13: qty 5

## 2023-01-13 MED ORDER — HYDROCORTISONE (PERIANAL) 2.5 % EX CREA
TOPICAL_CREAM | Freq: Three times a day (TID) | CUTANEOUS | 0 refills | Status: DC
  Filled 2023-01-13: qty 30, 10d supply, fill #0

## 2023-01-13 MED ORDER — CHLORHEXIDINE GLUCONATE CLOTH 2 % EX PADS
6.0000 | MEDICATED_PAD | Freq: Every day | CUTANEOUS | Status: DC
  Administered 2023-01-13: 6 via TOPICAL

## 2023-01-13 NOTE — Discharge Summary (Signed)
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 On presentation he was febrile, tachycardic, tachypneic. CBC showed WBC count of 0.8, ANC of 0.1. Patient was admitted for neutropenic fever. He was started on cefepime.  Hospital course remained stable.  He is WBC count has significantly improved.  Currently he is afebrile, hemodynamically stable.  Antibiotics will be changed to oral, cultures have not shown any growth.  Medically stable for discharge home today   Following problems were addressed during the hospitalization:  Sepsis : Presented with dysuria, hematuria.  Fever on presentation.  Follow-up cultures.  Started empirically on cefepime. He denies any dysuria ,urine looks clear. Urine culture sent, no growth.  Looks like the blood culture sent in the ED has been canceled.  Patient remains afebrile today. He was on sinus tachycardia,now better.Normal TSH   Neutropenic fever: Low ANC.  Secondary to chemotherapy.Was given Udenyca for neutropenia on 01/06/2023 .ANC more than 1000 now   Pancytopenia: Secondary to chemotherapy.  Has thrombocytopenia.  Hemoglobin in the range of 7.  No evidence of acute blood loss.  This is most likely secondary to lymphoma, chemotherapy   History of Burkitt's lymphoma: Follows with oncology.  Status post chemo, cycle 3 of EPOCH-R   History of HIV: On Biktarvy, Bactrim.  Last viral load of 54,600 and CD4 of 115 as per 12/16/2024.Follows with ID.    Hypomagnesemia: Supplemented with magnesium   Obesity: BMI 36.4  Discharge  Diagnoses:  Principal Problem:   Acute cystitis Active Problems:   AIDS (acquired immune deficiency syndrome) ([place])   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[place])   Neutropenic fever ([place])   Pancytopenia ([place])   Hematuria    Discharge Instructions  Discharge Instructions     Diet general   Complete by: As directed    Discharge instructions   Complete by: As directed    1)Please take prescribed medications as instructed 2)Follow up with your oncologist   Increase activity slowly   Complete by: As directed       Allergies as of 01/13/2023   No Known Allergies      Medication List     TAKE these medications    amoxicillin-clavulanate 875-125 MG tablet Commonly known as: AUGMENTIN Take 1 tablet by mouth every 12 (twelve) hours for 4 days.   Biktarvy 50-200-25 MG Tabs tablet Generic drug: bictegravir-emtricitabine-tenofovir AF Take 1 tablet by mouth daily.   dexamethasone 4 MG tablet Commonly known as: DECADRON Take 1 tablet (4 mg total) by mouth 2 (two) times daily with breakfast and lunch for 2 days after completion of chemotherapy   hydrocortisone 2.5 % rectal cream Commonly known as: ANUSOL-HC Place rectally 3 (three) times daily.   lidocaine-prilocaine cream Commonly known as: EMLA Apply to affected area once What changed:  how much to take how to take this when to take this additional instructions   magic mouthwash w/lidocaine Soln Take 5 mLs by mouth 4 (four) times daily as needed for up to 7 days for mouth pain.   ondansetron 8 MG tablet Commonly known as: ZOFRAN Take 1 tablet (8 mg total) by mouth every 8 (eight) hours as  needed for nausea.   polyethylene glycol 17 g packet Commonly known as: MIRALAX / GLYCOLAX Take 1 packet (17 g) by mouth daily.   prochlorperazine 10 MG tablet Commonly known as: COMPAZINE Take 1 tablet (10 mg total) by mouth every 6 (six) hours as needed.   senna-docusate 8.6-50 MG tablet Commonly known as: Senokot-S Take 2 tablets by  mouth at bedtime as needed for mild constipation.   sodium bicarbonate/sodium chloride Soln 1 Application by Mouth Rinse route as needed for dry mouth.   sulfamethoxazole-trimethoprim 800-160 MG tablet Commonly known as: BACTRIM DS Take 1 tablet by mouth daily.   VITAMIN C PO Take 1 tablet by mouth daily.        No Known Allergies  Consultations: None   Procedures/Studies: DG Chest 2 View  Result Date: 01/10/2023 CLINICAL DATA:  Shortness of breath and fever EXAM: CHEST - 2 VIEW COMPARISON:  Chest x-ray and CT 11/10/2022. FINDINGS: Left IJ chest port with tip at the SVC right atrial junction. Underinflation. Developing mild opacity at the left lung base. Slight asymmetric vascular congestion at the left hilum. No pneumothorax or effusion. There is large mint of the mediastinum particularly along the aortopulmonary window. Please correlate for any known history including abnormal nodes as seen on CT scan of 11/10/2022. This is more prominent than the prior x-ray IMPRESSION: Chest port. Underinflation with developing opacity left lung base. Possible infiltrate versus atelectasis. Recommend follow-up Widened mediastinum with fullness in the aortopulmonary window. Please correlate for known history of abnormal lymph nodes Electronically Signed   By: Jill Side M.D.   On: 01/10/2023 18:26      Subjective: Patient seen and examined at bedside today.  Hemodynamically stable for discharge.  Discharge Exam: Vitals:   01/13/23 0203 01/13/23 0543  BP: (!) 108/58 117/60  Pulse: (!) 110 97  Resp: 16 18  Temp: 99.1 F (37.3 C) 99 F (37.2 C)  SpO2: 91% 98%   Vitals:   01/12/23 1806 01/12/23 1947 01/13/23 0203 01/13/23 0543  BP: (!) 113/55 110/63 (!) 108/58 117/60  Pulse: (!) 126 (!) 121 (!) 110 97  Resp: (!) '22 18 16 18  '$ Temp:  98.5 F (36.9 C) 99.1 F (37.3 C) 99 F (37.2 C)  TempSrc:  Oral Oral Oral  SpO2: 92% 93% 91% 98%  Weight:      Height:        General: Pt is  alert, awake, not in acute distress, obese Cardiovascular: RRR, S1/S2 +, no rubs, no gallops Respiratory: CTA bilaterally, no wheezing, no rhonchi Abdominal: Soft, NT, ND, bowel sounds + Extremities: no edema, no cyanosis    The results of significant diagnostics from this hospitalization (including imaging, microbiology, ancillary and laboratory) are listed below for reference.     Microbiology: Recent Results (from the past 240 hour(s))  Urine Culture     Status: None   Collection Time: 01/10/23  1:52 PM   Specimen: Urine, Clean Catch  Result Value Ref Range Status   Specimen Description   Final    URINE, CLEAN CATCH Performed at [facility], [address], [place], [place] [address]    Special Requests   Final    URINE, CLEAN CATCH Performed at [facility], [place] [address], [place], [place] [address]    Culture   Final    NO GROWTH Performed at [facility], [place] [address] [place], [place] [address]    Report Status 01/11/2023 FINAL  Final  Culture, blood (single) w Reflex to ID Panel     Status: None (Preliminary result)   Collection Time: 01/10/23  4:00 PM   Specimen: BLOOD  Result Value Ref Range Status   Specimen Description BLOOD RIGHT ANTECUBITAL  Final   Special Requests   Final    BOTTLES DRAWN AEROBIC AND ANAEROBIC Blood Culture results may not be optimal due to an excessive volume of blood received in culture bottles   Culture   Final    NO GROWTH 3 DAYS Performed at [facility], [place] [address] [place], [place] [address]    Report Status PENDING  Incomplete  Resp panel by RT-PCR (RSV, Flu A&B, Covid) Anterior Nasal Swab     Status: None   Collection Time: 01/10/23  5:43 PM   Specimen: Anterior Nasal Swab  Result Value Ref Range Status   SARS Coronavirus 2 by RT PCR NEGATIVE NEGATIVE Final    Comment: (NOTE) SARS-CoV-2 target nucleic acids are NOT DETECTED.  The SARS-CoV-2 RNA is generally  detectable in upper respiratory specimens during the acute phase of infection. The lowest concentration of SARS-CoV-2 viral copies this assay can detect is 138 copies/mL. A negative result does not preclude SARS-Cov-2 infection and should not be used as the sole basis for treatment or other patient management decisions. A negative result may occur with  improper specimen collection/handling, submission of specimen other than nasopharyngeal swab, presence of viral mutation(s) within the areas targeted by this assay, and inadequate number of viral copies(<138 copies/mL). A negative result must be combined with clinical observations, patient history, and epidemiological information. The expected result is Negative.  Fact Sheet for Patients:  EntrepreneurPulse.com.au  Fact Sheet for Healthcare Providers:  IncredibleEmployment.be  This test is no t yet approved or cleared by the [place] FDA and  has been authorized for detection and/or diagnosis of SARS-CoV-2 by FDA under an Emergency Use Authorization (EUA). This EUA will remain  in effect (meaning this test can be used) for the duration of the COVID-19 declaration under Section 564(b)(1) of the Act, 21 U.S.C.section 360bbb-3(b)(1), unless the authorization is terminated  or revoked sooner.       Influenza A by PCR NEGATIVE NEGATIVE Final   Influenza B by PCR NEGATIVE NEGATIVE Final    Comment: (NOTE) The Xpert Xpress SARS-CoV-2/FLU/RSV plus assay is intended as an aid in the diagnosis of influenza from Nasopharyngeal swab specimens and should not be used as a sole basis for treatment. Nasal washings and aspirates are unacceptable for Xpert Xpress SARS-CoV-2/FLU/RSV testing.  Fact Sheet for Patients: EntrepreneurPulse.com.au  Fact Sheet for Healthcare Providers: IncredibleEmployment.be  This test is not yet approved or cleared by the [place] FDA  and has been authorized for detection and/or diagnosis of SARS-CoV-2 by FDA under an Emergency Use Authorization (EUA). This EUA will remain in effect (meaning this test can be used) for the duration of the COVID-19 declaration under Section 564(b)(1) of the Act, 21 U.S.C. section 360bbb-3(b)(1), unless the authorization is terminated or revoked.     Resp Syncytial Virus by PCR NEGATIVE NEGATIVE Final    Comment: (NOTE) Fact Sheet for Patients: EntrepreneurPulse.com.au  Fact Sheet for Healthcare Providers: IncredibleEmployment.be  This test is not yet approved or cleared by the [place] FDA and has been authorized for detection and/or diagnosis of SARS-CoV-2 by FDA under an Emergency Use Authorization (EUA). This EUA will remain in effect (meaning this test can be used) for the duration of the COVID-19  declaration under Section 564(b)(1) of the Act, 21 U.S.C. section 360bbb-3(b)(1), unless the authorization is terminated or revoked.  Performed at [facility], [place] [address], [place], [place] [address]   Urine Culture (for pregnant, neutropenic or urologic patients or patients with an indwelling urinary catheter)     Status: None   Collection Time: 01/11/23  8:23 AM   Specimen: Urine, Clean Catch  Result Value Ref Range Status   Specimen Description   Final    URINE, CLEAN CATCH Performed at [facility], [place] [address] [place], [place] [address]    Special Requests   Final    NONE Performed at [facility], [place] [address] [place], [place] [address]    Culture   Final    NO GROWTH Performed at [facility], [place] [address], [place], [place] [address]    Report Status 01/12/2023 FINAL  Final     Labs: BNP (last 3 results) No results for input(s): "BNP" in the last 8760 hours. Basic Metabolic Panel: Recent Labs  Lab 01/10/23 1352 01/11/23 0538 01/12/23 0630  NA  137 138 137  K 3.6 4.1 3.6  CL 103 105 106  CO2 '27 24 23  '$ GLUCOSE 92 111* 106*  BUN '7 8 7  '$ CREATININE 1.08 1.11 1.14  CALCIUM 9.1 8.7* 7.8*  MG  --  1.4* 2.1   Liver Function Tests: Recent Labs  Lab 01/10/23 1352  AST 10*  ALT 15  ALKPHOS 57  BILITOT 0.7  PROT 6.4*  ALBUMIN 3.7   No results for input(s): "LIPASE", "AMYLASE" in the last 168 hours. No results for input(s): "AMMONIA" in the last 168 hours. CBC: Recent Labs  Lab 01/10/23 1352 01/11/23 0538 01/12/23 0630  WBC 0.8* 1.3* 6.2  NEUTROABS 0.1* 0.1* 3.4  HGB 7.8* 7.4* 7.3*  HCT 23.1* 23.3* 22.8*  MCV 82.8 85.3 86.0  PLT 68* 50* 60*   Cardiac Enzymes: No results for input(s): "CKTOTAL", "CKMB", "CKMBINDEX", "TROPONINI" in the last 168 hours. BNP: Invalid input(s): "POCBNP" CBG: No results for input(s): "GLUCAP" in the last 168 hours. D-Dimer No results for input(s): "DDIMER" in the last 72 hours. Hgb A1c No results for input(s): "HGBA1C" in the last 72 hours. Lipid Profile No results for input(s): "CHOL", "HDL", "LDLCALC", "TRIG", "CHOLHDL", "LDLDIRECT" in the last 72 hours. Thyroid function studies Recent Labs    01/12/23 1049  TSH 1.231   Anemia work up No results for input(s): "VITAMINB12", "FOLATE", "FERRITIN", "TIBC", "IRON", "RETICCTPCT" in the last 72 hours. Urinalysis    Component Value Date/Time   COLORURINE YELLOW 01/10/2023 1352   APPEARANCEUR CLEAR 01/10/2023 1352   LABSPEC 1.012 01/10/2023 1352   PHURINE 7.0 01/10/2023 1352   GLUCOSEU NEGATIVE 01/10/2023 1352   HGBUR MODERATE (A) 01/10/2023 1352   BILIRUBINUR NEGATIVE 01/10/2023 1352   KETONESUR NEGATIVE 01/10/2023 1352   PROTEINUR 100 (A) 01/10/2023 1352   UROBILINOGEN 1.0 09/02/2013 0854   NITRITE NEGATIVE 01/10/2023 1352   LEUKOCYTESUR NEGATIVE 01/10/2023 1352   Sepsis Labs Recent Labs  Lab 01/10/23 1352 01/11/23 0538 01/12/23 0630  WBC 0.8* 1.3* 6.2   Microbiology Recent Results (from the past 240 hour(s))  Urine  Culture     Status: None   Collection Time: 01/10/23  1:52 PM   Specimen: Urine, Clean Catch  Result Value Ref Range Status   Specimen Description   Final    URINE, CLEAN CATCH Performed at [facility], [place]  [address] [place], [place] [address]    Special Requests   Final    URINE, CLEAN CATCH Performed at [facility], [place] [address] [name], [place] [address]    Culture   Final    NO GROWTH Performed at [facility], [place] [address], [place], [place] [address]    Report Status 01/11/2023 FINAL  Final  Culture, blood (single) w Reflex to ID Panel     Status: None (Preliminary result)   Collection Time: 01/10/23  4:00 PM   Specimen: BLOOD  Result Value Ref Range Status   Specimen Description BLOOD RIGHT ANTECUBITAL  Final   Special Requests   Final    BOTTLES DRAWN AEROBIC AND ANAEROBIC Blood Culture results may not be optimal due to an excessive volume of blood received in culture bottles   Culture   Final    NO GROWTH 3 DAYS Performed at [facility], [place] [address], [place], [place] [address]    Report Status PENDING  Incomplete  Resp panel by RT-PCR (RSV, Flu A&B, Covid) Anterior Nasal Swab     Status: None   Collection Time: 01/10/23  5:43 PM   Specimen: Anterior Nasal Swab  Result Value Ref Range Status   SARS Coronavirus 2 by RT PCR NEGATIVE NEGATIVE Final    Comment: (NOTE) SARS-CoV-2 target nucleic acids are NOT DETECTED.  The SARS-CoV-2 RNA is generally detectable in upper respiratory specimens during the acute phase of infection. The lowest concentration of SARS-CoV-2 viral copies this assay can detect is 138 copies/mL. A negative result does not preclude SARS-Cov-2 infection and should not be used as the sole basis for treatment or other patient management decisions. A negative result may occur with  improper specimen collection/handling, submission of specimen other than nasopharyngeal swab,  presence of viral mutation(s) within the areas targeted by this assay, and inadequate number of viral copies(<138 copies/mL). A negative result must be combined with clinical observations, patient history, and epidemiological information. The expected result is Negative.  Fact Sheet for Patients:  EntrepreneurPulse.com.au  Fact Sheet for Healthcare Providers:  IncredibleEmployment.be  This test is no t yet approved or cleared by the [place] FDA and  has been authorized for detection and/or diagnosis of SARS-CoV-2 by FDA under an Emergency Use Authorization (EUA). This EUA will remain  in effect (meaning this test can be used) for the duration of the COVID-19 declaration under Section 564(b)(1) of the Act, 21 U.S.C.section 360bbb-3(b)(1), unless the authorization is terminated  or revoked sooner.       Influenza A by PCR NEGATIVE NEGATIVE Final   Influenza B by PCR NEGATIVE NEGATIVE Final    Comment: (NOTE) The Xpert Xpress SARS-CoV-2/FLU/RSV plus assay is intended as an aid in the diagnosis of influenza from Nasopharyngeal swab specimens and should not be used as a sole basis for treatment. Nasal washings and aspirates are unacceptable for Xpert Xpress SARS-CoV-2/FLU/RSV testing.  Fact Sheet for Patients: EntrepreneurPulse.com.au  Fact Sheet for Healthcare Providers: IncredibleEmployment.be  This test is not yet approved or cleared by the [place] FDA and has been authorized for detection and/or diagnosis of SARS-CoV-2 by FDA under an Emergency Use Authorization (EUA). This EUA will remain in effect (meaning this test can be used) for the duration of the COVID-19 declaration under Section 564(b)(1) of the Act, 21 U.S.C. section 360bbb-3(b)(1), unless the authorization is terminated or revoked.     Resp Syncytial Virus by PCR NEGATIVE NEGATIVE Final  Comment: (NOTE) Fact Sheet for  Patients: EntrepreneurPulse.com.au  Fact Sheet for Healthcare Providers: IncredibleEmployment.be  This test is not yet approved or cleared by the [place] FDA and has been authorized for detection and/or diagnosis of SARS-CoV-2 by FDA under an Emergency Use Authorization (EUA). This EUA will remain in effect (meaning this test can be used) for the duration of the COVID-19 declaration under Section 564(b)(1) of the Act, 21 U.S.C. section 360bbb-3(b)(1), unless the authorization is terminated or revoked.  Performed at [facility], [place] [address] [place], [place] [address]   Urine Culture (for pregnant, neutropenic or urologic patients or patients with an indwelling urinary catheter)     Status: None   Collection Time: 01/11/23  8:23 AM   Specimen: Urine, Clean Catch  Result Value Ref Range Status   Specimen Description   Final    URINE, CLEAN CATCH Performed at [facility], [place] [name] [place], [place] [address]    Special Requests   Final    NONE Performed at [facility], [place] [address] [place], [place] [address]    Culture   Final    NO GROWTH Performed at [facility], [place] [address] [place], [place] [address]    Report Status 01/12/2023 FINAL  Final    Please note: You were cared for by a hospitalist during your hospital stay. Once you are discharged, your primary care physician will handle any further medical issues. Please note that NO REFILLS for any discharge medications will be authorized once you are discharged, as it is imperative that you return to your primary care physician (or establish a relationship with a primary care physician if you do not have one) for your post hospital discharge needs so that they can reassess your need for medications and monitor your lab values.    Time coordinating discharge: 40 minutes  SIGNED:   Shelly Coss,  MD  [facility] 01/13/2023, 10:25 AM Pager [phone]  If 7PM-7AM, please contact night-coverage [email or web address] Password [email or web address]

## 2023-01-13 NOTE — Plan of Care (Signed)
  Problem: Education: Goal: Knowledge of the prescribed therapeutic regimen will improve Outcome: Adequate for Discharge   Problem: Activity: Goal: Ability to implement measures to reduce episodes of fatigue will improve Outcome: Adequate for Discharge   Problem: Bowel/Gastric: Goal: Will not experience complications related to bowel motility Outcome: Adequate for Discharge   Problem: Coping: Goal: Ability to identify and develop effective coping behavior will improve Outcome: Adequate for Discharge   Problem: Nutritional: Goal: Maintenance of adequate nutrition will improve Outcome: Adequate for Discharge   Problem: Education: Goal: Knowledge of General Education information will improve Description: Including pain rating scale, medication(s)/side effects and non-pharmacologic comfort measures Outcome: Adequate for Discharge   Problem: Health Behavior/Discharge Planning: Goal: Ability to manage health-related needs will improve Outcome: Adequate for Discharge   Problem: Clinical Measurements: Goal: Ability to maintain clinical measurements within normal limits will improve Outcome: Adequate for Discharge Goal: Will remain free from infection Outcome: Adequate for Discharge Goal: Diagnostic test results will improve Outcome: Adequate for Discharge Goal: Respiratory complications will improve Outcome: Adequate for Discharge Goal: Cardiovascular complication will be avoided Outcome: Adequate for Discharge   Problem: Activity: Goal: Risk for activity intolerance will decrease Outcome: Adequate for Discharge   Problem: Nutrition: Goal: Adequate nutrition will be maintained Outcome: Adequate for Discharge   Problem: Coping: Goal: Level of anxiety will decrease Outcome: Adequate for Discharge   Problem: Elimination: Goal: Will not experience complications related to bowel motility Outcome: Adequate for Discharge Goal: Will not experience complications related to  urinary retention Outcome: Adequate for Discharge   Problem: Pain Managment: Goal: General experience of comfort will improve Outcome: Adequate for Discharge   Problem: Safety: Goal: Ability to remain free from injury will improve Outcome: Adequate for Discharge   Problem: Skin Integrity: Goal: Risk for impaired skin integrity will decrease Outcome: Adequate for Discharge

## 2023-01-14 ENCOUNTER — Other Ambulatory Visit (HOSPITAL_COMMUNITY): Payer: Self-pay

## 2023-01-15 ENCOUNTER — Encounter (HOSPITAL_COMMUNITY): Payer: Self-pay

## 2023-01-15 ENCOUNTER — Encounter: Payer: Self-pay | Admitting: Hematology

## 2023-01-15 ENCOUNTER — Other Ambulatory Visit: Payer: Self-pay

## 2023-01-15 ENCOUNTER — Encounter: Payer: Self-pay | Admitting: Internal Medicine

## 2023-01-15 ENCOUNTER — Ambulatory Visit (INDEPENDENT_AMBULATORY_CARE_PROVIDER_SITE_OTHER): Payer: 59 | Admitting: Internal Medicine

## 2023-01-15 VITALS — BP 103/70 | HR 88 | Temp 98.2°F | Resp 16 | Ht 73.0 in | Wt 279.0 lb

## 2023-01-15 DIAGNOSIS — R300 Dysuria: Secondary | ICD-10-CM | POA: Diagnosis not present

## 2023-01-15 DIAGNOSIS — C8378 Burkitt lymphoma, lymph nodes of multiple sites: Secondary | ICD-10-CM

## 2023-01-15 LAB — CULTURE, BLOOD (SINGLE): Culture: NO GROWTH

## 2023-01-15 NOTE — Progress Notes (Signed)
[place] [facility]      HPI: Logan Ward is a 42 y.o. 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 Recent admission for sepsis with neutropenic fever. Pt reports thathis dyruia is improving(Cx negative. GC urine negative). He is having urinary incotninenece, increased frequency.  Denies fevers and chills.  Patient admitted to [facility] 1/26 - 1/29 for neutropenic fever secondary to unclear etiology suspected UTI.  Presented with temp of 101.3 and ANC 100 with symptoms of dysuria.  Urine cultures, blood cultures with no growth discharged on Augmentin and Bactrim to complete about 7 days antibiotics. He did not pick up meds yet, missed yesterday's dose.  Past Medical History:  Diagnosis Date   Anal fissure    HIV infection ([place])    Internal hemorrhoids    Obesity    Rectal ulcer     Past Surgical History:  Procedure Laterality Date   IR IMAGING GUIDED PORT INSERTION  11/18/2022    Family History  Problem Relation Age of Onset   Hypertension Father    Throat cancer Father    Heart attack Brother    Colon cancer Neg Hx    Esophageal cancer Neg Hx    Stomach cancer Neg Hx    Current Outpatient Medications on File Prior to Visit  Medication Sig Dispense Refill   amoxicillin-clavulanate (AUGMENTIN) 875-125 MG tablet Take 1 tablet by mouth every 12 hours for 4 days. 8 tablet 0   Ascorbic Acid (VITAMIN C PO) Take 1 tablet by mouth daily.     bictegravir-emtricitabine-tenofovir AF (BIKTARVY) 50-200-25 MG TABS tablet Take 1 tablet by mouth daily. 30 tablet 5   dexamethasone (DECADRON) 4 MG tablet Take 1 tablet (4 mg total) by mouth 2 (two) times daily with breakfast and lunch for 2 days after completion of chemotherapy 30 tablet 0   hydrocortisone (ANUSOL-HC) 2.5 % rectal cream Place rectally as directed 3 times daily. 30 g 0   lidocaine-prilocaine (EMLA) cream Apply to affected area once (Patient taking  differently: Apply 1 Application topically once.) 30 g 3   magic mouthwash (nystatin, lidocaine, diphenhydrAMINE, alum & mag hydroxide) suspension Take 5 mLs by mouth 4 times daily as needed for mouth pain. 50 mL 0   ondansetron (ZOFRAN) 8 MG tablet Take 1 tablet (8 mg total) by mouth every 8 (eight) hours as needed for nausea. (Patient not taking: Reported on 01/12/2023) 30 tablet 3   polyethylene glycol (MIRALAX / GLYCOLAX) 17 g packet Take 1 packet (17 g) by mouth daily. (Patient not taking: Reported on 12/10/2022) 14 each 0   prochlorperazine (COMPAZINE) 10 MG tablet Take 1 tablet (10 mg total) by mouth every 6 (six) hours as needed. (Patient not taking: Reported on 01/12/2023) 30 tablet 2   senna-docusate (SENOKOT-S) 8.6-50 MG tablet Take 2 tablets by mouth at bedtime as needed for mild constipation. (Patient not taking: Reported on 01/12/2023) 60 tablet 1   Sodium Chloride-Sodium Bicarb (SODIUM BICARBONATE/SODIUM CHLORIDE) SOLN Use 1 Application by Mouth Rinse route as needed for dry mouth. 100 mL 0   sulfamethoxazole-trimethoprim (BACTRIM DS) 800-160 MG tablet Take 1 tablet by mouth daily. 30 tablet 5   No current facility-administered medications on file prior to visit.    No Known Allergies    Lab Results HIV 1 RNA Quant (Copies/mL)  Date Value  12/19/2022 77 (H)  10/21/2022 324 (H)  08/28/2022 <20 (H)  CD4 T Cell Abs (/uL)  Date Value  12/19/2022 190 (L)  10/21/2022 501  08/28/2022 162 (L)   No results found for: "HIV1GENOSEQ" Lab Results  Component Value Date   WBC 6.2 01/12/2023   HGB 7.3 (L) 01/12/2023   HCT 22.8 (L) 01/12/2023   MCV 86.0 01/12/2023   PLT 60 (L) 01/12/2023    Lab Results  Component Value Date   CREATININE 1.14 01/12/2023   BUN 7 01/12/2023   NA 137 01/12/2023   K 3.6 01/12/2023   CL 106 01/12/2023   CO2 23 01/12/2023   Lab Results  Component Value Date   ALT 15 01/10/2023   AST 10 (L) 01/10/2023   ALKPHOS 57 01/10/2023   BILITOT 0.7  01/10/2023    Lab Results  Component Value Date   CHOL 104 10/21/2022   TRIG 175 (H) 10/21/2022   HDL 31 (L) 10/21/2022   LDLCALC 48 10/21/2022   No results found for: "HAV" No results found for: "HEPBSAG", "HEPBSAB" No results found for: "HCVAB" Lab Results  Component Value Date   CHLAMYDIAWP Negative 01/10/2023   N Negative 01/10/2023   No results found for: "GCPROBEAPT" No results found for: "QUANTGOLD"  Plan #Urinary Symptoms -Patient reports he continues to have urinary incontinence and increased frequency.  Although notes dysuria is improved.  He notes that he has not had the symptoms before.  He was recently admitted to [facility] long with neutropenic fever, urine cultures negative, GC urine negative.  Discharged Augmentin and Bactrim to complete 7 days of antibiotics. Missed yesterday's abx as he plans on picking up abx today - He is going to be admitted to [facility] a week for chemotherapy for Burkitt's lymphoma.  I ordered a renal ultrasound.  He can get that done while he is admitted.  If his symptoms of dysuria, incontinence, urinary urgency do not improve by hospitalization would engage urology at that time as well. Plan: -Complete 7 days antibiotics Augmentin and Bactrim - Renal ultrasound and RPR while admitted.  Patient does not want to get a blood draw today. - Follow-up with infectious disease as planned with Terri Piedra on 04/03/23  #HIV -biktarvy and bactrim  #Burkitt's lymphoma -Plan for hospital chemotherapy next week   Laurice Record, MD [facility] [place]   I have personally spent 35 minutes involved in face-to-face and non-face-to-face activities for this patient on the day of the visit. Professional time spent includes the following activities: Preparing to see the patient (review of tests), Obtaining and/or reviewing separately obtained history (admission/discharge record), Performing a medically appropriate  examination and/or evaluation , Ordering medications/tests/procedures, referring and communicating with other health care professionals, Documenting clinical information in the EMR, Independently interpreting results (not separately reported), Communicating results to the patient/family/caregiver, Counseling and educating the patient/family/caregiver and Care coordination (not separately reported).

## 2023-01-16 ENCOUNTER — Other Ambulatory Visit (HOSPITAL_COMMUNITY): Payer: Self-pay

## 2023-01-17 ENCOUNTER — Inpatient Hospital Stay (HOSPITAL_BASED_OUTPATIENT_CLINIC_OR_DEPARTMENT_OTHER): Payer: 59 | Admitting: Hematology

## 2023-01-17 ENCOUNTER — Encounter (HOSPITAL_COMMUNITY)
Admission: RE | Admit: 2023-01-17 | Discharge: 2023-01-17 | Disposition: A | Discharge status: Home / Self Care | Payer: 59 | Source: Ambulatory Visit | Attending: Hematology | Admitting: Hematology

## 2023-01-17 ENCOUNTER — Other Ambulatory Visit: Payer: Self-pay

## 2023-01-17 ENCOUNTER — Inpatient Hospital Stay: Payer: 59 | Attending: Hematology

## 2023-01-17 DIAGNOSIS — Z5189 Encounter for other specified aftercare: Secondary | ICD-10-CM | POA: Insufficient documentation

## 2023-01-17 DIAGNOSIS — Z5112 Encounter for antineoplastic immunotherapy: Secondary | ICD-10-CM | POA: Insufficient documentation

## 2023-01-17 DIAGNOSIS — C8370 Burkitt lymphoma, unspecified site: Secondary | ICD-10-CM | POA: Insufficient documentation

## 2023-01-17 DIAGNOSIS — E883 Tumor lysis syndrome: Secondary | ICD-10-CM | POA: Insufficient documentation

## 2023-01-17 DIAGNOSIS — C8378 Burkitt lymphoma, lymph nodes of multiple sites: Secondary | ICD-10-CM | POA: Diagnosis not present

## 2023-01-17 DIAGNOSIS — B20 Human immunodeficiency virus [HIV] disease: Secondary | ICD-10-CM | POA: Insufficient documentation

## 2023-01-17 DIAGNOSIS — Z79624 Long term (current) use of inhibitors of nucleotide synthesis: Secondary | ICD-10-CM | POA: Insufficient documentation

## 2023-01-17 DIAGNOSIS — D61818 Other pancytopenia: Secondary | ICD-10-CM

## 2023-01-17 DIAGNOSIS — D696 Thrombocytopenia, unspecified: Secondary | ICD-10-CM | POA: Insufficient documentation

## 2023-01-17 DIAGNOSIS — R634 Abnormal weight loss: Secondary | ICD-10-CM | POA: Insufficient documentation

## 2023-01-17 DIAGNOSIS — Z79899 Other long term (current) drug therapy: Secondary | ICD-10-CM | POA: Insufficient documentation

## 2023-01-17 LAB — CBC WITH DIFFERENTIAL (CANCER CENTER ONLY)
Abs Immature Granulocytes: 0.55 10*3/uL — ABNORMAL HIGH (ref 0.00–0.07)
Basophils Absolute: 0.1 10*3/uL (ref 0.0–0.1)
Basophils Relative: 1 %
Eosinophils Absolute: 0 10*3/uL (ref 0.0–0.5)
Eosinophils Relative: 0 %
HCT: 26.7 % — ABNORMAL LOW (ref 39.0–52.0)
Hemoglobin: 8.5 g/dL — ABNORMAL LOW (ref 13.0–17.0)
Immature Granulocytes: 9 %
Lymphocytes Relative: 15 %
Lymphs Abs: 0.9 10*3/uL (ref 0.7–4.0)
MCH: 27.7 pg (ref 26.0–34.0)
MCHC: 31.8 g/dL (ref 30.0–36.0)
MCV: 87 fL (ref 80.0–100.0)
Monocytes Absolute: 0.9 10*3/uL (ref 0.1–1.0)
Monocytes Relative: 15 %
Neutro Abs: 3.7 10*3/uL (ref 1.7–7.7)
Neutrophils Relative %: 60 %
Platelet Count: 141 10*3/uL — ABNORMAL LOW (ref 150–400)
RBC: 3.07 MIL/uL — ABNORMAL LOW (ref 4.22–5.81)
RDW: 20.5 % — ABNORMAL HIGH (ref 11.5–15.5)
WBC Count: 6.1 10*3/uL (ref 4.0–10.5)
nRBC: 4 % — ABNORMAL HIGH (ref 0.0–0.2)

## 2023-01-17 LAB — CMP (CANCER CENTER ONLY)
ALT: 16 U/L (ref 0–44)
AST: 15 U/L (ref 15–41)
Albumin: 4.1 g/dL (ref 3.5–5.0)
Alkaline Phosphatase: 67 U/L (ref 38–126)
Anion gap: 7 (ref 5–15)
BUN: 6 mg/dL (ref 6–20)
CO2: 27 mmol/L (ref 22–32)
Calcium: 9.4 mg/dL (ref 8.9–10.3)
Chloride: 107 mmol/L (ref 98–111)
Creatinine: 1.03 mg/dL (ref 0.61–1.24)
GFR, Estimated: >60 mL/min (ref >60)
Glucose, Bld: 96 mg/dL (ref 70–99)
Potassium: 4.2 mmol/L (ref 3.5–5.1)
Sodium: 141 mmol/L (ref 135–145)
Total Bilirubin: 0.7 mg/dL (ref 0.3–1.2)
Total Protein: 6.6 g/dL (ref 6.5–8.1)

## 2023-01-17 LAB — GLUCOSE, CAPILLARY: Glucose-Capillary: 96 mg/dL (ref 70–99)

## 2023-01-17 MED ORDER — SODIUM CHLORIDE 0.9% FLUSH
10.0000 mL | Freq: Once | INTRAVENOUS | Status: AC
  Administered 2023-01-17: 10 mL

## 2023-01-17 MED ORDER — FLUDEOXYGLUCOSE F - 18 (FDG) INJECTION
16.0000 | Freq: Once | INTRAVENOUS | Status: AC
  Administered 2023-01-17: 13.68 via INTRAVENOUS

## 2023-01-17 MED ORDER — HEPARIN SOD (PORK) LOCK FLUSH 100 UNIT/ML IV SOLN
500.0000 [IU] | Freq: Once | INTRAVENOUS | Status: AC
  Administered 2023-01-17: 500 [IU] via INTRAVENOUS
  Filled 2023-01-17: qty 5

## 2023-01-17 NOTE — Progress Notes (Signed)
[place] [facility] OFFICE PROGRESS NOTE  Date of service.01/17/23    DIAGNOSIS: Recently diagnosed Burkitt's lymphoma in patient with HIV/AIDs.  The patient presented with a rapidly growing right cervical nodal mass extending into the level 2 nodal station of the right supraclavicular nodal station and extension into the superficial right sternocleidomastoid muscle.  There is also hypermetabolic right axillary nodal disease.  No evidence of spleen or solid organ involvement.  No evidence of marrow involvement.  The patient was diagnosed in November/December 2023  HISTORY OF PRESENTING ILLNESS:  Logan Ward is a wonderful 42 y.o. male who has been referred to [place] by Logan Starla Link MD for evaluation and management of newly diagnosed Burkitt's lymphoma in the setting of known history of HIV/AIDS.   Patient has a history of HIV/AIDS [last CD4 count on 10/21/2022 of 501 and viral load of 324 copies per mL, acquired through by sexual contact, diagnosed 3 to 4 months ago, follows with Logan Ward], syphilis, obesity presented to the hospital on 11/10/2022 with rapidly growing right anterior chest wall mass.   CT of the neck on 11/10/2022 showed large infiltrative centrally necrotic soft tissue mass throughout the right neck and extending to the upper chest anterior to the clavicle measuring 8.1 x 5.9 x 7.4 cm in size.  There are numerous additional prominent right sided cervical chain lymph nodes measuring 1 cm at level 2A and 1.1 cm in the posterior triangle. CT chest abdomen pelvis done on 11/10/2022 showed enlarged right lower cervical, supraclavicular and bilaterally axillary lymph nodes.  Largest right axillary lymph node measuring 2.2 x 1.7 cm.  Diffuse fat stranding throughout the superior mediastinum.  Prominent subcentimeter retroperitoneal and iliac lymph nodes.  Diffuse retroperitoneal fat stranding of uncertain significance.  No evidence of mass or evidence of organ metastatic disease in the  abdomen or pelvis.  Patient had an ultrasound-guided core needle biopsy of the right neck mass on 11/12/2022 which shows B-cell non-Hodgkin's lymphoma with high-grade features and findings consistent with Burkitt's lymphoma.    PRIOR THERAPY: None  CURRENT THERAPY: Status post 2 cycles of dose adjusted EPOCH-R chemotherapy.  First dose started on 11/18/22.   INTERVAL HISTORY:  Logan Ward 42 y.o. male with Burkitt's lymphoma who is being followed-up for toxicity check.  Patient was last seen by me on 12/23/22 and was doing well overall with no new medical concerns.  .I connected with [name] on 01/17/23 at  3:30 PM EST by telephone visit and verified that I am speaking with the correct person using two identifiers.   I discussed the limitations, risks, security and privacy concerns of performing an evaluation and management service by telemedicine and the availability of in-person appointments. I also discussed with the patient that there may be a patient responsible charge related to this service. The patient expressed understanding and agreed to proceed.   Other persons participating in the visit and their role in the encounter: none   Patient's location: home  Provider's location: [facility]   Chief Complaint: evaluation and management of newly diagnosed Burkitt's lymphoma in the setting of known history of HIV/AIDS.   Today, he complains of a tingling sensation when passing urine. He denies any other urinary discomfort, blood in the urine, or fever.  MEDICAL HISTORY: Past Medical History:  Diagnosis Date   Anal fissure    HIV infection ([place])    Internal hemorrhoids    Obesity    Rectal ulcer     ALLERGIES:  has No Known Allergies.  MEDICATIONS:  Current Outpatient Medications  Medication Sig Dispense Refill   amoxicillin-clavulanate (AUGMENTIN) 875-125 MG tablet Take 1 tablet by mouth every 12 hours for 4 days. 8 tablet 0   Ascorbic Acid (VITAMIN C PO) Take 1 tablet by  mouth daily.     bictegravir-emtricitabine-tenofovir AF (BIKTARVY) 50-200-25 MG TABS tablet Take 1 tablet by mouth daily. 30 tablet 5   dexamethasone (DECADRON) 4 MG tablet Take 1 tablet (4 mg total) by mouth 2 (two) times daily with breakfast and lunch for 2 days after completion of chemotherapy 30 tablet 0   hydrocortisone (ANUSOL-HC) 2.5 % rectal cream Place rectally as directed 3 times daily. 30 g 0   lidocaine-prilocaine (EMLA) cream Apply to affected area once (Patient taking differently: Apply 1 Application topically once.) 30 g 3   magic mouthwash (nystatin, lidocaine, diphenhydrAMINE, alum & mag hydroxide) suspension Take 5 mLs by mouth 4 times daily as needed for mouth pain. 50 mL 0   ondansetron (ZOFRAN) 8 MG tablet Take 1 tablet (8 mg total) by mouth every 8 (eight) hours as needed for nausea. (Patient not taking: Reported on 01/12/2023) 30 tablet 3   polyethylene glycol (MIRALAX / GLYCOLAX) 17 g packet Take 1 packet (17 g) by mouth daily. (Patient not taking: Reported on 12/10/2022) 14 each 0   prochlorperazine (COMPAZINE) 10 MG tablet Take 1 tablet (10 mg total) by mouth every 6 (six) hours as needed. (Patient not taking: Reported on 01/12/2023) 30 tablet 2   senna-docusate (SENOKOT-S) 8.6-50 MG tablet Take 2 tablets by mouth at bedtime as needed for mild constipation. (Patient not taking: Reported on 01/12/2023) 60 tablet 1   Sodium Chloride-Sodium Bicarb (SODIUM BICARBONATE/SODIUM CHLORIDE) SOLN Use 1 Application by Mouth Rinse route as needed for dry mouth. 100 mL 0   sulfamethoxazole-trimethoprim (BACTRIM DS) 800-160 MG tablet Take 1 tablet by mouth daily. 30 tablet 5   No current facility-administered medications for this visit.    SURGICAL HISTORY:  Past Surgical History:  Procedure Laterality Date   IR IMAGING GUIDED PORT INSERTION  11/18/2022    REVIEW OF SYSTEMS:    10 Point review of Systems was done is negative except as noted above.   PHYSICAL EXAMINATION: PHONE  VISIT There were no vitals taken for this visit.  ECOG PERFORMANCE STATUS: 1   LABORATORY DATA:  .    Latest Ref Rng & Units 01/12/2023    6:30 AM 01/11/2023    5:38 AM 01/10/2023    1:52 PM  CBC  WBC 4.0 - 10.5 K/uL 6.2  1.3  0.8   Hemoglobin 13.0 - 17.0 g/dL 7.3  7.4  7.8   Hematocrit 39.0 - 52.0 % 22.8  23.3  23.1   Platelets 150 - 400 K/uL 60  50  68       Latest Ref Rng & Units 01/12/2023    6:30 AM 01/11/2023    5:38 AM 01/10/2023    1:52 PM  CMP  Glucose 70 - 99 mg/dL 106  111  92   BUN 6 - 20 mg/dL '7  8  7   '$ Creatinine 0.61 - 1.24 mg/dL 1.14  1.11  1.08   Sodium 135 - 145 mmol/L 137  138  137   Potassium 3.5 - 5.1 mmol/L 3.6  4.1  3.6   Chloride 98 - 111 mmol/L 106  105  103   CO2 22 - 32 mmol/L '23  24  27   '$ Calcium 8.9 -  10.3 mg/dL 7.8  8.7  9.1   Total Protein 6.5 - 8.1 g/dL   6.4   Total Bilirubin 0.3 - 1.2 mg/dL   0.7   Alkaline Phos 38 - 126 U/L   57   AST 15 - 41 U/L   10   ALT 0 - 44 U/L   15    Uric acid 10.4    RADIOGRAPHIC STUDIES:  DG Chest 2 View  Result Date: 01/10/2023 CLINICAL DATA:  Shortness of breath and fever EXAM: CHEST - 2 VIEW COMPARISON:  Chest x-ray and CT 11/10/2022. FINDINGS: Left IJ chest port with tip at the SVC right atrial junction. Underinflation. Developing mild opacity at the left lung base. Slight asymmetric vascular congestion at the left hilum. No pneumothorax or effusion. There is large mint of the mediastinum particularly along the aortopulmonary window. Please correlate for any known history including abnormal nodes as seen on CT scan of 11/10/2022. This is more prominent than the prior x-ray IMPRESSION: Chest port. Underinflation with developing opacity left lung base. Possible infiltrate versus atelectasis. Recommend follow-up Widened mediastinum with fullness in the aortopulmonary window. Please correlate for known history of abnormal lymph nodes Electronically Signed   By: Jill Side M.D.   On: 01/10/2023 18:26      ASSESSMENT/PLAN:  41 year old male with HIV/AIDS with newly diagnosed Burkitt's lymphoma   #1  Stage II recently diagnosed Burkitt's lymphoma Presenting with rapidly growing right neck mass extending into the chest, bilateral x-ray lymphadenopathy and also concern for possible involvement of retroperitoneal lymph nodes. Concerning for at least stage II possibly stage III disease. Some borderline lymphadenopathy in the abdomen could also be from his recently diagnosed HIV/AIDS. No clinical symptoms suggestive of CNS involvement at this time. Brain MRI negative.  No constitutional symptoms. No significant cytopenias to suggest bone marrow involvement. Discussed with patient and he is not interested in fertility preservation or sperm banking. First presented in August at [facility] and was treated empirically with steroids and antibiotics with improvement in swelling prior to additional progression. -Currently on dose adjusted EPOCH-R. Status post 1 cycle. Tolerated well overall.  -Scheduled for cycle #2 on 12/10/22. Will help facilitate inpatient admission. -Outpatient follow up on 12/17/22 for Rituxan and Neulasta   #2 HIV/AIDS recently diagnosed 3 to 4 months ago. Following Logan. Elna Breslow from [place] and currently taking Biktarvy.   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  Patient reports this was completely treated.   #4 spontaneous tumor lysis syndrome:  #5 Prophylaxis: Continue salt water and baking soda mouthwash rinses 4 times a day to reduce mucositis.  MiraLAX and senna as for constipation Bactrim for prophylactic PJP.   #6:Decreased appetite, nausea, belching, and weight loss: Patient has lost weight since last being seen.  Drug to drug interactions noted with PPIs methotrexate.  Patient encouraged to try Pepcid and take nausea medicine prior to eating.  I sent a prescription for Compazine to alternate with Zofran if needed for nausea.  I will refer the patient to member  the nutritionist team.  Patient Dors is some sensation of feeling like food is getting stuck in his esophagus.  I offered referral to GI.  The patient declined at this time.  Patient was open to trying Remeron which is an antidepressant but it can help patient's with insomnia and decreased appetite.  I sent a prescription for this to the patient's pharmacy.  #7: Thrombocytopenia: Platelets have normalized during previous treatment but  with nadir down to 71k    PLAN: -Seen for toxicity check after cycle 3 of dose adjusted EPOCH-R chemotherapy -Discussed lab results on 01/12/23 with patient. CBC showed WBC normalized to 6.1 K, hemoglobin of 8.5, and platelets of 141 K. CMP normal -PET scan final report pending -Proceed with Cycle 4 of chemotherapy on Monday -continue with current chemotherapy dose -continue to hold off on intrathecal dosage     FOLLOW-UP: ***  The total time spent in the appointment was *** minutes* .  All of the patient's questions were answered with apparent satisfaction. The patient knows to call the clinic with any problems, questions or concerns.   Sullivan Lone MD MS AAHIVMS [facility] Hematology/Oncology Physician [facility]  .*Total Encounter Time as defined by the Centers for Medicare and Medicaid Services includes, in addition to the face-to-face time of a patient visit (documented in the note above) non-face-to-face time: obtaining and reviewing outside history, ordering and reviewing medications, tests or procedures, care coordination (communications with other health care professionals or caregivers) and documentation in the medical record.   I,Mitra Faeizi,acting as a Education administrator for Sullivan Lone, MD.,have documented all relevant documentation on the behalf of Sullivan Lone, MD,as directed by  Sullivan Lone, MD while in the presence of Sullivan Lone, MD.  ***

## 2023-01-20 ENCOUNTER — Encounter (HOSPITAL_COMMUNITY): Payer: Self-pay | Admitting: Hematology

## 2023-01-20 ENCOUNTER — Inpatient Hospital Stay (HOSPITAL_COMMUNITY)
Admission: RE | Admit: 2023-01-20 | Discharge: 2023-01-24 | DRG: 974 | Disposition: A | Discharge status: Home / Self Care | Payer: 59 | Source: Ambulatory Visit | Attending: Hematology | Admitting: Hematology

## 2023-01-20 ENCOUNTER — Other Ambulatory Visit: Payer: Self-pay

## 2023-01-20 DIAGNOSIS — K626 Ulcer of anus and rectum: Secondary | ICD-10-CM | POA: Diagnosis present

## 2023-01-20 DIAGNOSIS — Z808 Family history of malignant neoplasm of other organs or systems: Secondary | ICD-10-CM

## 2023-01-20 DIAGNOSIS — E876 Hypokalemia: Secondary | ICD-10-CM

## 2023-01-20 DIAGNOSIS — E669 Obesity, unspecified: Secondary | ICD-10-CM | POA: Diagnosis present

## 2023-01-20 DIAGNOSIS — C8378 Burkitt lymphoma, lymph nodes of multiple sites: Secondary | ICD-10-CM | POA: Diagnosis present

## 2023-01-20 DIAGNOSIS — B20 Human immunodeficiency virus [HIV] disease: Secondary | ICD-10-CM | POA: Diagnosis present

## 2023-01-20 DIAGNOSIS — T451X5A Adverse effect of antineoplastic and immunosuppressive drugs, initial encounter: Secondary | ICD-10-CM | POA: Diagnosis present

## 2023-01-20 DIAGNOSIS — Z8249 Family history of ischemic heart disease and other diseases of the circulatory system: Secondary | ICD-10-CM

## 2023-01-20 DIAGNOSIS — G620 Drug-induced polyneuropathy: Secondary | ICD-10-CM | POA: Diagnosis present

## 2023-01-20 DIAGNOSIS — Z7189 Other specified counseling: Secondary | ICD-10-CM

## 2023-01-20 DIAGNOSIS — Z5111 Encounter for antineoplastic chemotherapy: Secondary | ICD-10-CM

## 2023-01-20 DIAGNOSIS — D649 Anemia, unspecified: Secondary | ICD-10-CM

## 2023-01-20 DIAGNOSIS — Z79899 Other long term (current) drug therapy: Secondary | ICD-10-CM

## 2023-01-20 DIAGNOSIS — Z6835 Body mass index (BMI) 35.0-35.9, adult: Secondary | ICD-10-CM | POA: Diagnosis not present

## 2023-01-20 DIAGNOSIS — T368X5A Adverse effect of other systemic antibiotics, initial encounter: Secondary | ICD-10-CM | POA: Diagnosis present

## 2023-01-20 DIAGNOSIS — D61810 Antineoplastic chemotherapy induced pancytopenia: Secondary | ICD-10-CM | POA: Diagnosis present

## 2023-01-20 DIAGNOSIS — Z87440 Personal history of urinary (tract) infections: Secondary | ICD-10-CM | POA: Diagnosis not present

## 2023-01-20 DIAGNOSIS — K648 Other hemorrhoids: Secondary | ICD-10-CM | POA: Diagnosis present

## 2023-01-20 LAB — CBC WITH DIFFERENTIAL/PLATELET
Abs Immature Granulocytes: 0.01 10*3/uL (ref 0.00–0.07)
Basophils Absolute: 0 10*3/uL (ref 0.0–0.1)
Basophils Relative: 1 %
Eosinophils Absolute: 0 10*3/uL (ref 0.0–0.5)
Eosinophils Relative: 0 %
HCT: 27.1 % — ABNORMAL LOW (ref 39.0–52.0)
Hemoglobin: 8.2 g/dL — ABNORMAL LOW (ref 13.0–17.0)
Immature Granulocytes: 0 %
Lymphocytes Relative: 29 %
Lymphs Abs: 0.7 10*3/uL (ref 0.7–4.0)
MCH: 27.4 pg (ref 26.0–34.0)
MCHC: 30.3 g/dL (ref 30.0–36.0)
MCV: 90.6 fL (ref 80.0–100.0)
Monocytes Absolute: 0.4 10*3/uL (ref 0.1–1.0)
Monocytes Relative: 17 %
Neutro Abs: 1.3 10*3/uL — ABNORMAL LOW (ref 1.7–7.7)
Neutrophils Relative %: 53 %
Platelets: 224 10*3/uL (ref 150–400)
RBC: 2.99 MIL/uL — ABNORMAL LOW (ref 4.22–5.81)
RDW: 21.3 % — ABNORMAL HIGH (ref 11.5–15.5)
WBC: 2.4 10*3/uL — ABNORMAL LOW (ref 4.0–10.5)
nRBC: 2.5 % — ABNORMAL HIGH (ref 0.0–0.2)

## 2023-01-20 LAB — TYPE AND SCREEN
ABO/RH(D): AB POS
Antibody Screen: NEGATIVE

## 2023-01-20 LAB — COMPREHENSIVE METABOLIC PANEL
ALT: 17 U/L (ref 0–44)
AST: 24 U/L (ref 15–41)
Albumin: 3.8 g/dL (ref 3.5–5.0)
Alkaline Phosphatase: 49 U/L (ref 38–126)
Anion gap: 7 (ref 5–15)
BUN: 11 mg/dL (ref 6–20)
CO2: 26 mmol/L (ref 22–32)
Calcium: 8.6 mg/dL — ABNORMAL LOW (ref 8.9–10.3)
Chloride: 106 mmol/L (ref 98–111)
Creatinine, Ser: 0.96 mg/dL (ref 0.61–1.24)
GFR, Estimated: >60 mL/min (ref >60)
Glucose, Bld: 111 mg/dL — ABNORMAL HIGH (ref 70–99)
Potassium: 3.8 mmol/L (ref 3.5–5.1)
Sodium: 139 mmol/L (ref 135–145)
Total Bilirubin: 1 mg/dL (ref 0.3–1.2)
Total Protein: 6.6 g/dL (ref 6.5–8.1)

## 2023-01-20 LAB — URINALYSIS, W/ REFLEX TO CULTURE (INFECTION SUSPECTED)
Bacteria, UA: NONE SEEN
Bilirubin Urine: NEGATIVE
Glucose, UA: NEGATIVE mg/dL
Hgb urine dipstick: NEGATIVE
Ketones, ur: NEGATIVE mg/dL
Leukocytes,Ua: NEGATIVE
Nitrite: NEGATIVE
Protein, ur: NEGATIVE mg/dL
Specific Gravity, Urine: 1.02 (ref 1.005–1.030)
pH: 7 (ref 5.0–8.0)

## 2023-01-20 LAB — PHOSPHORUS: Phosphorus: 4.1 mg/dL (ref 2.5–4.6)

## 2023-01-20 LAB — URIC ACID: Uric Acid, Serum: 9.5 mg/dL — ABNORMAL HIGH (ref 3.7–8.6)

## 2023-01-20 LAB — MAGNESIUM: Magnesium: 2.5 mg/dL — ABNORMAL HIGH (ref 1.7–2.4)

## 2023-01-20 MED ORDER — VINCRISTINE SULFATE CHEMO INJECTION 1 MG/ML
Freq: Once | INTRAVENOUS | Status: AC
  Filled 2023-01-20: qty 13

## 2023-01-20 MED ORDER — SODIUM BICARBONATE/SODIUM CHLORIDE MOUTHWASH
1.0000 | OROMUCOSAL | Status: DC | PRN
  Filled 2023-01-20: qty 1000

## 2023-01-20 MED ORDER — SULFAMETHOXAZOLE-TRIMETHOPRIM 800-160 MG PO TABS
1.0000 | ORAL_TABLET | Freq: Every day | ORAL | Status: DC
  Administered 2023-01-20 – 2023-01-24 (×5): 1 via ORAL
  Filled 2023-01-20 (×5): qty 1

## 2023-01-20 MED ORDER — LIDOCAINE-PRILOCAINE 2.5-2.5 % EX CREA: 1.0000 | TOPICAL_CREAM | Freq: Once | CUTANEOUS | Status: DC

## 2023-01-20 MED ORDER — ENOXAPARIN SODIUM 40 MG/0.4ML IJ SOSY
40.0000 mg | PREFILLED_SYRINGE | INTRAMUSCULAR | Status: DC
  Administered 2023-01-20: 40 mg via SUBCUTANEOUS
  Filled 2023-01-20 (×2): qty 0.4

## 2023-01-20 MED ORDER — SODIUM CHLORIDE 0.9 % IV SOLN: INTRAVENOUS | Status: DC | PRN

## 2023-01-20 MED ORDER — SODIUM CHLORIDE 0.9 % IV SOLN
Freq: Once | INTRAVENOUS | Status: AC
  Administered 2023-01-20: 8 mg via INTRAVENOUS
  Filled 2023-01-20 (×2): qty 4

## 2023-01-20 MED ORDER — SENNOSIDES-DOCUSATE SODIUM 8.6-50 MG PO TABS: 2.0000 | ORAL_TABLET | Freq: Every evening | ORAL | Status: DC | PRN

## 2023-01-20 MED ORDER — ALUM & MAG HYDROXIDE-SIMETH 200-200-20 MG/5ML PO SUSP
60.0000 mL | ORAL | Status: DC | PRN
  Administered 2023-01-23: 60 mL via ORAL
  Filled 2023-01-20: qty 60

## 2023-01-20 MED ORDER — HYDROCORTISONE (PERIANAL) 2.5 % EX CREA
TOPICAL_CREAM | Freq: Three times a day (TID) | CUTANEOUS | Status: DC
  Filled 2023-01-20 (×2): qty 28.35

## 2023-01-20 MED ORDER — B COMPLEX-C PO TABS
1.0000 | ORAL_TABLET | Freq: Every day | ORAL | Status: DC
  Administered 2023-01-20 – 2023-01-23 (×4): 1 via ORAL
  Filled 2023-01-20 (×4): qty 1

## 2023-01-20 MED ORDER — POLYETHYLENE GLYCOL 3350 17 G PO PACK
17.0000 g | PACK | Freq: Every day | ORAL | Status: DC
  Filled 2023-01-20 (×2): qty 1

## 2023-01-20 MED ORDER — ACETAMINOPHEN 325 MG PO TABS
650.0000 mg | ORAL_TABLET | ORAL | Status: DC | PRN
  Administered 2023-01-20: 650 mg via ORAL
  Filled 2023-01-20: qty 2

## 2023-01-20 MED ORDER — BICTEGRAVIR-EMTRICITAB-TENOFOV 50-200-25 MG PO TABS
1.0000 | ORAL_TABLET | Freq: Every day | ORAL | Status: DC
  Administered 2023-01-20 – 2023-01-24 (×5): 1 via ORAL
  Filled 2023-01-20 (×5): qty 1

## 2023-01-20 NOTE — TOC Progression Note (Signed)
Transition of Care [facility]) - Progression Note    Patient Details  Name: Logan Ward MRN: [id number] Date of Birth: [birth year]  Transition of Care [facility]) CM/SW [place], RN Phone [phone]  01/20/2023, 1:33 PM  Clinical Narrative:     Transition of Care [facility]) Screening Note   Patient Details  Name: Logan Ward Date of Birth: [birth year]   Transition of Care [facility]) CM/SW Contact:    Angelita Ingles, RN Phone Number: 01/20/2023, 1:33 PM    Transition of Care Department [facility]) has reviewed patient and no TOC needs have been identified at this time. We will continue to monitor patient advancement through interdisciplinary progression rounds. If new patient transition needs arise, please place a TOC consult.          Expected Discharge Plan and Services                                               Social Determinants of Health (SDOH) Interventions SDOH Screenings   Food Insecurity: No Food Insecurity (01/11/2023)  Housing: Low Risk  (01/11/2023)  Transportation Needs: No Transportation Needs (01/11/2023)  Utilities: Not At Risk (01/11/2023)  Alcohol Screen: Low Risk  (10/24/2022)  Depression (PHQ2-9): Low Risk  (01/15/2023)  Financial Resource Strain: Low Risk  (10/24/2022)  Physical Activity: Sufficiently Active (10/24/2022)  Recent Concern: Physical Activity - Insufficiently Active (10/24/2022)  Social Connections: Unknown (10/24/2022)  Stress: No Stress Concern Present (10/24/2022)  Tobacco Use: Low Risk  (01/15/2023)    Readmission Risk Interventions     No data to display

## 2023-01-20 NOTE — Progress Notes (Signed)
HEMATOLOGY/ONCOLOGY INPATIENT PROGRESS NOTE  Date of Service: 01/20/2023  Inpatient Attending: .Johney Maine, MD   SUBJECTIVE  Patient is well-known to me from the oncology clinic.  Recent diagnosis of Burkitt's lymphoma status post cycle 3 of dose adjusted EPOCH-R chemotherapy. He is here to start cycle 4 during this visit.   Patient was complaining of tingling sensation when passing urine, but denies hematuria or fever on 01/17/2023.   Patient reports he has been doing well overall since our last visit. During this visit, he complains of mild headache. He denies fever, chills, night sweats, back pain, abdominal pain, chest pain, or leg swelling.   I discussed with the patient that if his hemoglobin decreases to 7.5 or below, we will need blood transfusion. Patient notes he does not want blood transfusion unless it is absolutely necessary.    PHYSICAL EXAMINATION: . Vitals:   01/20/23 0922  BP: 120/76  Pulse: 98  Resp: 18  Temp: 98.6 F (37 C)  TempSrc: Oral  SpO2: 100%  Weight: 122.9 kg  Height: 6\' 1"  (1.854 m)   Filed Weights   01/20/23 0922  Weight: 122.9 kg   .Body mass index is 35.75 kg/m.  GENERAL:alert, in no acute distress and comfortable SKIN: skin color, texture, turgor are normal, no rashes or significant lesions EYES: normal, conjunctiva are pink and non-injected, sclera clear OROPHARYNX:no exudate, no erythema and lips, buccal mucosa, and tongue normal  NECK: supple, no JVD, thyroid normal size, non-tender, without nodularity LYMPH:  no palpable lymphadenopathy in the cervical, axillary or inguinal LUNGS: clear to auscultation with normal respiratory effort HEART: regular rate & rhythm,  no murmurs and no lower extremity edema ABDOMEN: abdomen soft, non-tender, normoactive bowel sounds  Musculoskeletal: no cyanosis of digits and no clubbing  PSYCH: alert & oriented x 3 with fluent speech NEURO: no focal motor/sensory deficits  MEDICAL  HISTORY:  Past Medical History:  Diagnosis Date   Anal fissure    HIV infection (HCC)    Internal hemorrhoids    Obesity    Rectal ulcer     SURGICAL HISTORY: Past Surgical History:  Procedure Laterality Date   IR IMAGING GUIDED PORT INSERTION  11/18/2022    SOCIAL HISTORY: Social History   Socioeconomic History   Marital status: Single    Spouse name: Not on file   Number of children: Not on file   Years of education: Not on file   Highest education level: Not on file  Occupational History   Not on file  Tobacco Use   Smoking status: Never   Smokeless tobacco: Never  Vaping Use   Vaping Use: Never used  Substance and Sexual Activity   Alcohol use: No   Drug use: No   Sexual activity: Yes    Comment: declined condoms  Other Topics Concern   Not on file  Social History Narrative   Not on file   Social Determinants of Health   Financial Resource Strain: Low Risk  (10/24/2022)   Overall Financial Resource Strain (CARDIA)    Difficulty of Paying Living Expenses: Not hard at all  Food Insecurity: No Food Insecurity (01/11/2023)   Hunger Vital Sign    Worried About Running Out of Food in the Last Year: Never true    Ran Out of Food in the Last Year: Never true  Transportation Needs: No Transportation Needs (01/11/2023)   PRAPARE - Administrator, Civil Service (Medical): No    Lack of Transportation (Non-Medical):  No  Physical Activity: Sufficiently Active (10/24/2022)   Exercise Vital Sign    Days of Exercise per Week: 3 days    Minutes of Exercise per Session: 60 min  Recent Concern: Physical Activity - Insufficiently Active (10/24/2022)   Exercise Vital Sign    Days of Exercise per Week: 3 days    Minutes of Exercise per Session: 20 min  Stress: No Stress Concern Present (10/24/2022)   [employer] of Occupational Health - Occupational Stress Questionnaire    Feeling of Stress : Not at all  Social Connections: Unknown (10/24/2022)   Social  Connection and Isolation Panel [NHANES]    Frequency of Communication with Friends and Family: Twice a week    Frequency of Social Gatherings with Friends and Family: Twice a week    Attends Religious Services: 1 to 4 times per year    Active Member of [employer] or Organizations: No    Attends Banker Meetings: 1 to 4 times per year    Marital Status: Patient refused  Intimate Partner Violence: Not At Risk (01/11/2023)   Humiliation, Afraid, Rape, and Kick questionnaire    Fear of Current or Ex-Partner: No    Emotionally Abused: No    Physically Abused: No    Sexually Abused: No    FAMILY HISTORY: Family History  Problem Relation Age of Onset   Hypertension Father    Throat cancer Father    Heart attack Brother    Colon cancer Neg Hx    Esophageal cancer Neg Hx    Stomach cancer Neg Hx     ALLERGIES:  has No Known Allergies.  MEDICATIONS:  Scheduled Meds:  bictegravir-emtricitabine-tenofovir AF  1 tablet Oral Daily   enoxaparin (LOVENOX) injection  40 mg Subcutaneous Q24H   hydrocortisone   Rectal TID   lidocaine-prilocaine  1 Application Topical Once   polyethylene glycol  17 g Oral Daily   sulfamethoxazole-trimethoprim  1 tablet Oral Daily   Continuous Infusions: PRN Meds:.acetaminophen, alum & mag hydroxide-simeth, senna-docusate, sodium bicarbonate/sodium chloride  REVIEW OF SYSTEMS:    10 Point review of Systems was done is negative except as noted above.   LABORATORY DATA:  I have reviewed the data as listed  .    Latest Ref Rng & Units 01/17/2023   11:44 AM 01/12/2023    6:30 AM 01/11/2023    5:38 AM  CBC  WBC 4.0 - 10.5 K/uL 6.1  6.2  1.3   Hemoglobin 13.0 - 17.0 g/dL 8.5  7.3  7.4   Hematocrit 39.0 - 52.0 % 26.7  22.8  23.3   Platelets 150 - 400 K/uL 141  60  50     .    Latest Ref Rng & Units 01/17/2023   11:44 AM 01/12/2023    6:30 AM 01/11/2023    5:38 AM  CMP  Glucose 70 - 99 mg/dL 96  161  096   BUN 6 - 20 mg/dL 6  7  8    Creatinine  0.61 - 1.24 mg/dL 0.45  4.09  8.11   Sodium 135 - 145 mmol/L 141  137  138   Potassium 3.5 - 5.1 mmol/L 4.2  3.6  4.1   Chloride 98 - 111 mmol/L 107  106  105   CO2 22 - 32 mmol/L 27  23  24    Calcium 8.9 - 10.3 mg/dL 9.4  7.8  8.7   Total Protein 6.5 - 8.1 g/dL 6.6     Total  Bilirubin 0.3 - 1.2 mg/dL 0.7     Alkaline Phos 38 - 126 U/L 67     AST 15 - 41 U/L 15     ALT 0 - 44 U/L 16        RADIOGRAPHIC STUDIES: I have personally reviewed the radiological images as listed and agreed with the findings in the report. NM PET Image Restag (PS) Skull Base To Thigh  Result Date: 01/18/2023 CLINICAL DATA:  Subsequent treatment strategy for Burkitt's lymphoma. Status post 3 cycles of chemo immunotherapy. EXAM: NUCLEAR MEDICINE PET SKULL BASE TO THIGH TECHNIQUE: 13.68 mCi F-18 FDG was injected intravenously. Full-ring PET imaging was performed from the skull base to thigh after the radiotracer. CT data was obtained and used for attenuation correction and anatomic localization. Fasting blood glucose: 96 mg/dl COMPARISON:  PET-CT 09/81/1914 FINDINGS: Mediastinal blood pool activity: SUV max 2.06 Liver activity: SUV max 4.13 NECK: No residual hypermetabolic adenopathy. The large bulk E right neck mass extending into the right subclavicular area has near completely resolved. Residual subcutaneous disease anterior to the right sternocleidomastoid muscle is noted. This measures 2.5 x 2.4 cm and previously measured 5 x 5 cm. SUV max is 3.12 and was previously 33.13. Incidental CT findings: None. CHEST: The substernal/upper right anterior mediastinal disease has resolved. No residual measurable tumor or hypermetabolism. The right axillary adenopathy the has resolved. No residual measurable disease or hypermetabolism. Incidental CT findings: Left IJ power port in good position without complicating features. No enlarged mediastinal or hilar lymph nodes. No worrisome pulmonary findings. ABDOMEN/PELVIS: No abnormal  hypermetabolic activity within the liver, pancreas, adrenal glands, or spleen. No hypermetabolic lymph nodes in the abdomen or pelvis. Incidental CT findings: Thick wall bladder with pericystic interstitial/inflammatory changes could suggest cystitis. Recommend clinical correlation. SKELETON: Diffuse marrow FDG uptake likely related to chemotherapy or marrow stimulating drugs. Incidental CT findings: None. IMPRESSION: 1. PET-CT findings suggest an excellent response to treatment ([place] 1). The large markedly hypermetabolic right nodal neck mass extending into the thoracic inlet and upper right anterior mediastinum has largely resolved. Only a small area of residual disease in the subcutaneous fat overlying the right sternocleidomastoid muscle remains (De[place] 3). 2. No disease below the diaphragm. 3. Mild diffuse marrow uptake likely due to chemotherapy or marrow stimulating drugs. 4. Thick wall bladder with pericystic interstitial/inflammatory changes could suggest cystitis. Electronically Signed   By: Rudie Meyer M.D.   On: 01/18/2023 09:00   DG Chest 2 View  Result Date: 01/10/2023 CLINICAL DATA:  Shortness of breath and fever EXAM: CHEST - 2 VIEW COMPARISON:  Chest x-ray and CT 11/10/2022. FINDINGS: Left IJ chest port with tip at the SVC right atrial junction. Underinflation. Developing mild opacity at the left lung base. Slight asymmetric vascular congestion at the left hilum. No pneumothorax or effusion. There is large mint of the mediastinum particularly along the aortopulmonary window. Please correlate for any known history including abnormal nodes as seen on CT scan of 11/10/2022. This is more prominent than the prior x-ray IMPRESSION: Chest port. Underinflation with developing opacity left lung base. Possible infiltrate versus atelectasis. Recommend follow-up Widened mediastinum with fullness in the aortopulmonary window. Please correlate for known history of abnormal lymph nodes Electronically  Signed   By: Karen Kays M.D.   On: 01/10/2023 18:26    ASSESSMENT & PLAN:   42 year old male with HIV/AIDS with newly diagnosed Burkitt's lymphoma   #1  Stage II recently diagnosed Burkitt's lymphoma Presenting with rapidly growing right neck mass  extending into the chest, bilateral x-ray lymphadenopathy and also concern for possible involvement of retroperitoneal lymph nodes. Concerning for at least stage II possibly stage III disease. Some borderline lymphadenopathy in the abdomen could also be from his recently diagnosed HIV/AIDS. No clinical symptoms suggestive of CNS involvement at this time. Brain MRI negative.  No constitutional symptoms. No significant cytopenias to suggest bone marrow involvement. Discussed with patient and he is not interested in fertility preservation or sperm banking. First presented in August at [facility] and was treated empirically with steroids and antibiotics with improvement in swelling prior to additional progression. -Currently on dose adjusted EPOCH-R. Status post 1 cycle. Tolerated well overall.  -Scheduled for cycle #2 on 12/10/22. Will help facilitate inpatient admission. -Outpatient follow up on 12/17/22 for Rituxan and Neulasta   #2 HIV/AIDS recently diagnosed 3 to 4 months ago. Following Dr. Carver Fila from ID and currently taking Biktarvy.   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  Patient reports this was completely treated.   #4 pancytopenia likely due to chemotherapy.  Accentuated by the patient's underlying HIV/AIDS, antiretroviral therapy and Bactrim .   #5 severe neutropenia with fever.  Likely urinary tract infection given patient's hematuria and dysuria though it is also possible that he may have passed a kidney stone.  PLAN: -Discussed lab results from today, 01/20/2023, with the patient. CBC shows decreased WBC of 2.4, decreased hemoglobin of 8.2, and hematocrit of 27.1. CMP is stable.  -Discussed PET scan results from  01/17/2023 which showed excellent response to treatment. The large markedly hypermetabolic right nodal neck mass extending into the thoracic inlet and upper right anterior mediastinum has largely resolved. Only a small area of residual disease in the subcutaneous fat overlying the right sternocleidomastoid muscle remains ([place]auville 3). -Discussed with the patient that if his hemoglobin drops below 7.5, we will need blood transfusion. Patient does not want blood transfusion unless it is absolutely necessary.  -Patient can proceed with his treatment as planned.   I spent {CHL ONC TIME VISIT - ZOXWR:[id number]} counseling the patient face to face. The total time spent in the appointment was {CHL ONC TIME VISIT - BJYNW:[id number]} and more than 50% was on counseling and direct patient cares.    Wyvonnia Lora MD MS AAHIVMS [facility] Hematology/Oncology Physician [facility]  (Office):       [phone] (Work cell):  [phone] (Fax):           [phone]  01/20/2023 10:28 AM    I,Param Shah,acting as a scribe for Dr. Wyvonnia Lora, MD.

## 2023-01-20 NOTE — TOC Initial Note (Signed)
Transition of Care [name]) - Initial/Assessment Note    Patient Details  Name: Logan Ward MRN: [id number] Date of Birth: 10-25-1981  [facility]) CM/SW Contact:    Angelita Ingles, RN Phone Number:[phone]  01/20/2023, 2:46 PM  Clinical Narrative:                 TOC assessment for patient with high risk for readmission. Patient states that he is from home where his mother is currently living with him. Patient states that he lives in a house and can function independently with no DME or home health services. Patient reports that his PCP is Dr. Marya Amsler located on [facility]. Patient states that he does follow up with his physician on a regular basis. Patient reports that he uses [facility] for medications. Patient states that sometimes the co pay for medications is expensive and when this happens he notifies his PCP or works out arrangements to paying for the medication. Patient does report that he takes his medications on a regular basis. Patient does have transportation to appointments. Currently there are no TOC needs noted. TOC will for follow to assist with disposition planning.   Expected Discharge Plan: Home/Self Care Barriers to Discharge: Continued Medical Work up   Patient Goals and CMS Choice Patient states their goals for this hospitalization and ongoing recovery are:: Wants to get better to go home CMS Medicare.gov Compare Post Acute Care list provided to::  (n/a) Choice offered to / list presented to : NA [place] ownership interest in [facility].provided to::  (n/a)    Expected Discharge Plan and Services In-house Referral: NA Discharge Planning Services: CM Consult Post Acute Care Choice: NA Living arrangements for the past 2 months: Single Family Home                 DME Arranged: N/A DME Agency: NA       HH Arranged: NA [place] Agency: NA        Prior Living Arrangements/Services Living arrangements for the  past 2 months: Single Family Home Lives with:: Parents Patient language and need for interpreter reviewed:: Yes Do you feel safe going back to the place where you live?: Yes      Need for Family Participation in Patient Care: No (Comment) Care giver support system in place?: Yes (comment) Current home services:  (n/a) Criminal Activity/Legal Involvement Pertinent to Current Situation/Hospitalization: No - Comment as needed  Activities of Daily Living Home Assistive Devices/Equipment: None ADL Screening (condition at time of admission) Patient's cognitive ability adequate to safely complete daily activities?: Yes Is the patient deaf or have difficulty hearing?: No Does the patient have difficulty seeing, even when wearing glasses/contacts?: No Does the patient have difficulty concentrating, remembering, or making decisions?: No Patient able to express need for assistance with ADLs?: Yes Does the patient have difficulty dressing or bathing?: No Independently performs ADLs?: Yes (appropriate for developmental age) Does the patient have difficulty walking or climbing stairs?: No Weakness of Legs: None Weakness of Arms/Hands: None  Permission Sought/Granted Permission sought to share information with : Family Supports Permission granted to share information with : No              Emotional Assessment Appearance:: Appears stated age Attitude/Demeanor/Rapport: Engaged, Gracious Affect (typically observed): Accepting, Pleasant Orientation: : Oriented to Self, Oriented to Place, Oriented to  Time, Oriented to Situation Alcohol / Substance Use: Not Applicable Psych Involvement: No (comment)  Admission  diagnosis:  Burkitt lymphoma of lymph nodes of multiple regions [facility]) [C83.78] Patient Active Problem List   Diagnosis Date Noted   Hematuria 01/11/2023   Neutropenic fever ([place]) 01/10/2023   Pancytopenia ([place]) 01/10/2023   Acute cystitis 01/10/2023   Anemia 01/01/2023   Healthcare  maintenance 12/19/2022   Encounter for chemotherapy management 12/12/2022   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[place]) 12/10/2022   Decreased appetite 12/02/2022   Drug-induced constipation 11/21/2022   Nausea without vomiting 11/21/2022   Acute nonintractable headache 11/21/2022   Encounter for antineoplastic chemotherapy 11/18/2022   Burkitt lymphoma of lymph nodes of multiple regions [facility]) 11/14/2022   Counseling regarding advance care planning and goals of care 11/14/2022   Chest mass 11/10/2022   Exposure to chlamydia 10/24/2022   Submental lymphadenopathy 09/05/2022   Goals of care, counseling/discussion 07/26/2022   History of syphilis 07/26/2022   Neck swelling 07/26/2022   AIDS (acquired immune deficiency syndrome) ([place]) 07/25/2022   PCP:  Pcp, No Pharmacy:   [place] [address] Phone: [phone] Fax: [phone]  [facility] DR[facility] STORE [place], [place] [address] Phone: [phone] Fax: [phone]     Social Determinants of Health (SDOH) Social History: [place]: No Food Insecurity (01/11/2023)  Housing: Low Risk  (01/11/2023)  Transportation Needs: No Transportation Needs (01/11/2023)  Utilities: Not At Risk (01/11/2023)  Alcohol Screen: Low Risk  (10/24/2022)  Depression (PHQ2-9): Low Risk  (01/15/2023)  Financial Resource Strain: Low Risk  (10/24/2022)  Physical Activity: Sufficiently Active (10/24/2022)  Recent Concern: Physical Activity - Insufficiently Active (10/24/2022)  Social Connections: Unknown (10/24/2022)  Stress: No Stress Concern Present (10/24/2022)  Tobacco Use: Low Risk  (01/15/2023)   SDOH Interventions:     Readmission Risk Interventions    01/20/2023    2:35 PM  Readmission Risk Prevention Plan  Transportation Screening Complete  PCP or Specialist Appt within 3-5  Days Complete  HRI or [place] Not Complete  HRI or Home Care Consult comments n/a  Social Work Consult for [place] Planning/Counseling [place] Not Applicable  Medication Review Press photographer) Referral to Pharmacy

## 2023-01-21 DIAGNOSIS — Z5111 Encounter for antineoplastic chemotherapy: Secondary | ICD-10-CM | POA: Diagnosis not present

## 2023-01-21 DIAGNOSIS — C8378 Burkitt lymphoma, lymph nodes of multiple sites: Secondary | ICD-10-CM | POA: Diagnosis not present

## 2023-01-21 LAB — CBC
HCT: 27.7 % — ABNORMAL LOW (ref 39.0–52.0)
Hemoglobin: 8.4 g/dL — ABNORMAL LOW (ref 13.0–17.0)
MCH: 27.7 pg (ref 26.0–34.0)
MCHC: 30.3 g/dL (ref 30.0–36.0)
MCV: 91.4 fL (ref 80.0–100.0)
Platelets: 244 10*3/uL (ref 150–400)
RBC: 3.03 MIL/uL — ABNORMAL LOW (ref 4.22–5.81)
RDW: 20.7 % — ABNORMAL HIGH (ref 11.5–15.5)
WBC: 3.5 10*3/uL — ABNORMAL LOW (ref 4.0–10.5)
nRBC: 1.4 % — ABNORMAL HIGH (ref 0.0–0.2)

## 2023-01-21 LAB — COMPREHENSIVE METABOLIC PANEL
ALT: 17 U/L (ref 0–44)
AST: 23 U/L (ref 15–41)
Albumin: 4 g/dL (ref 3.5–5.0)
Alkaline Phosphatase: 48 U/L (ref 38–126)
Anion gap: 8 (ref 5–15)
BUN: 12 mg/dL (ref 6–20)
CO2: 23 mmol/L (ref 22–32)
Calcium: 9.1 mg/dL (ref 8.9–10.3)
Chloride: 103 mmol/L (ref 98–111)
Creatinine, Ser: 0.93 mg/dL (ref 0.61–1.24)
GFR, Estimated: >60 mL/min (ref >60)
Glucose, Bld: 124 mg/dL — ABNORMAL HIGH (ref 70–99)
Potassium: 4.4 mmol/L (ref 3.5–5.1)
Sodium: 134 mmol/L — ABNORMAL LOW (ref 135–145)
Total Bilirubin: 1 mg/dL (ref 0.3–1.2)
Total Protein: 7.3 g/dL (ref 6.5–8.1)

## 2023-01-21 MED ORDER — CHLORHEXIDINE GLUCONATE CLOTH 2 % EX PADS
6.0000 | MEDICATED_PAD | Freq: Every day | CUTANEOUS | Status: DC
  Administered 2023-01-21 – 2023-01-24 (×3): 6 via TOPICAL

## 2023-01-21 MED ORDER — SODIUM CHLORIDE 0.9 % IV SOLN
Freq: Once | INTRAVENOUS | Status: AC
  Administered 2023-01-21: 18 mg via INTRAVENOUS
  Filled 2023-01-21: qty 4

## 2023-01-21 MED ORDER — VINCRISTINE SULFATE CHEMO INJECTION 1 MG/ML
Freq: Once | INTRAVENOUS | Status: AC
  Filled 2023-01-21: qty 13

## 2023-01-22 ENCOUNTER — Ambulatory Visit (HOSPITAL_COMMUNITY)
Admission: RE | Admit: 2023-01-22 | Discharge: 2023-01-22 | Disposition: A | Discharge status: Home / Self Care | Payer: 59 | Source: Ambulatory Visit | Attending: Internal Medicine | Admitting: Internal Medicine

## 2023-01-22 ENCOUNTER — Encounter: Payer: Self-pay | Admitting: Hematology

## 2023-01-22 DIAGNOSIS — C8378 Burkitt lymphoma, lymph nodes of multiple sites: Secondary | ICD-10-CM | POA: Diagnosis not present

## 2023-01-22 DIAGNOSIS — Z5111 Encounter for antineoplastic chemotherapy: Secondary | ICD-10-CM | POA: Diagnosis not present

## 2023-01-22 LAB — CBC WITH DIFFERENTIAL/PLATELET
Abs Immature Granulocytes: 0.01 10*3/uL (ref 0.00–0.07)
Basophils Absolute: 0 10*3/uL (ref 0.0–0.1)
Basophils Relative: 0 %
Eosinophils Absolute: 0 10*3/uL (ref 0.0–0.5)
Eosinophils Relative: 0 %
HCT: 27.8 % — ABNORMAL LOW (ref 39.0–52.0)
Hemoglobin: 8.5 g/dL — ABNORMAL LOW (ref 13.0–17.0)
Immature Granulocytes: 0 %
Lymphocytes Relative: 19 %
Lymphs Abs: 0.6 10*3/uL — ABNORMAL LOW (ref 0.7–4.0)
MCH: 28 pg (ref 26.0–34.0)
MCHC: 30.6 g/dL (ref 30.0–36.0)
MCV: 91.4 fL (ref 80.0–100.0)
Monocytes Absolute: 0.3 10*3/uL (ref 0.1–1.0)
Monocytes Relative: 8 %
Neutro Abs: 2.3 10*3/uL (ref 1.7–7.7)
Neutrophils Relative %: 73 %
Platelets: 279 10*3/uL (ref 150–400)
RBC: 3.04 MIL/uL — ABNORMAL LOW (ref 4.22–5.81)
RDW: 20.5 % — ABNORMAL HIGH (ref 11.5–15.5)
WBC: 3.2 10*3/uL — ABNORMAL LOW (ref 4.0–10.5)
nRBC: 0 % (ref 0.0–0.2)

## 2023-01-22 LAB — COMPREHENSIVE METABOLIC PANEL
ALT: 16 U/L (ref 0–44)
AST: 24 U/L (ref 15–41)
Albumin: 3.9 g/dL (ref 3.5–5.0)
Alkaline Phosphatase: 47 U/L (ref 38–126)
Anion gap: 11 (ref 5–15)
BUN: 14 mg/dL (ref 6–20)
CO2: 21 mmol/L — ABNORMAL LOW (ref 22–32)
Calcium: 8.8 mg/dL — ABNORMAL LOW (ref 8.9–10.3)
Chloride: 104 mmol/L (ref 98–111)
Creatinine, Ser: 0.94 mg/dL (ref 0.61–1.24)
GFR, Estimated: >60 mL/min (ref >60)
Glucose, Bld: 168 mg/dL — ABNORMAL HIGH (ref 70–99)
Potassium: 3.7 mmol/L (ref 3.5–5.1)
Sodium: 136 mmol/L (ref 135–145)
Total Bilirubin: 1.4 mg/dL — ABNORMAL HIGH (ref 0.3–1.2)
Total Protein: 7 g/dL (ref 6.5–8.1)

## 2023-01-22 MED ORDER — SODIUM BICARBONATE/SODIUM CHLORIDE MOUTHWASH
1.0000 | Freq: Four times a day (QID) | OROMUCOSAL | Status: DC
  Administered 2023-01-22 – 2023-01-23 (×7): 1 via OROMUCOSAL
  Filled 2023-01-22 (×2): qty 1000

## 2023-01-22 MED ORDER — PROCHLORPERAZINE EDISYLATE 10 MG/2ML IJ SOLN
5.0000 mg | Freq: Three times a day (TID) | INTRAMUSCULAR | Status: DC | PRN
  Administered 2023-01-22: 5 mg via INTRAVENOUS
  Filled 2023-01-22: qty 2

## 2023-01-22 MED ORDER — LORAZEPAM 0.5 MG PO TABS: 0.5000 mg | ORAL_TABLET | Freq: Three times a day (TID) | ORAL | Status: DC | PRN

## 2023-01-22 MED ORDER — SODIUM CHLORIDE 0.9 % IV SOLN
Freq: Once | INTRAVENOUS | Status: AC
  Administered 2023-01-22: 10 mg via INTRAVENOUS
  Filled 2023-01-22: qty 4

## 2023-01-22 MED ORDER — VINCRISTINE SULFATE CHEMO INJECTION 1 MG/ML
Freq: Once | INTRAVENOUS | Status: AC
  Filled 2023-01-22: qty 13

## 2023-01-22 NOTE — Progress Notes (Signed)
[facility]  HEMATOLOGY/ONCOLOGY INPATIENT PROGRESS NOTE  Date of Service: 01/22/2023  Inpatient Attending: .Brunetta Genera, MD   SUBJECTIVE  Patient notes no acute new symptoms and has tolerated EPOCH C4D1 of treatment. No uncontrolled Nausea or vomiting Had good BM today Labs reviewed with patient.   OBJECTIVE:  NAD  PHYSICAL EXAMINATION: . Vitals:   01/20/23 1707 01/20/23 2121 01/21/23 0547 01/21/23 1305  BP: 114/72 (!) 105/50 128/69 118/60  Pulse: 83 75 69 90  Resp: '18 20 20 18  '$ Temp: 98.2 F (36.8 C) 98.4 F (36.9 C) 98.1 F (36.7 C) 98.4 F (36.9 C)  TempSrc: Oral Oral Oral Oral  SpO2: 97% 99% 100% 98%  Weight:      Height:       Filed Weights   01/20/23 0922  Weight: 271 lb (122.9 kg)   .Body mass index is 35.75 kg/m. [facility] GENERAL:alert, in no acute distress and comfortable SKIN: no acute rashes, no significant lesions EYES: conjunctiva are pink and non-injected, sclera anicteric OROPHARYNX: MMM, no exudates, no oropharyngeal erythema or ulceration NECK: supple, no JVD LYMPH:  no palpable lymphadenopathy in the cervical, axillary or inguinal regions LUNGS: clear to auscultation b/l with normal respiratory effort HEART: regular rate & rhythm ABDOMEN:  normoactive bowel sounds , non tender, not distended. Extremity: no pedal edema PSYCH: alert & oriented x 3 with fluent speech NEURO: no focal motor/sensory deficits   MEDICAL HISTORY:  Past Medical History:  Diagnosis Date   Anal fissure    HIV infection ([place])    Internal hemorrhoids    Obesity    Rectal ulcer     SURGICAL HISTORY: Past Surgical History:  Procedure Laterality Date   IR IMAGING GUIDED PORT INSERTION  11/18/2022    SOCIAL HISTORY: Social History   Socioeconomic History   Marital status: Single    Spouse name: Not on file   Number of children: Not on file   Years of education: Not on file   Highest education level: Not on file  Occupational History   Not on file  Tobacco  Use   Smoking status: Never   Smokeless tobacco: Never  Vaping Use   Vaping Use: Never used  Substance and Sexual Activity   Alcohol use: No   Drug use: No   Sexual activity: Yes    Comment: declined condoms  Other Topics Concern   Not on file  Social History Narrative   Not on file   Social Determinants of Health   Financial Resource Strain: Low Risk  (10/24/2022)   Overall Financial Resource Strain (CARDIA)    Difficulty of Paying Living Expenses: Not hard at all  Food Insecurity: No Food Insecurity (01/20/2023)   Hunger Vital Sign    Worried About Running Out of Food in the Last Year: Never true    Ran Out of Food in the Last Year: Never true  Transportation Needs: No Transportation Needs (01/20/2023)   PRAPARE - Hydrologist (Medical): No    Lack of Transportation (Non-Medical): No  Physical Activity: Sufficiently Active (10/24/2022)   Exercise Vital Sign    Days of Exercise per Week: 3 days    Minutes of Exercise per Session: 60 min  Recent Concern: Physical Activity - Insufficiently Active (10/24/2022)   Exercise Vital Sign    Days of Exercise per Week: 3 days    Minutes of Exercise per Session: 20 min  Stress: No Stress Concern Present (10/24/2022)   [employer]  Occupational Health - Occupational Stress Questionnaire    Feeling of Stress : Not at all  Social Connections: Unknown (10/24/2022)   Social Connection and Isolation Panel [NHANES]    Frequency of Communication with Friends and Family: Twice a week    Frequency of Social Gatherings with Friends and Family: Twice a week    Attends Religious Services: 1 to 4 times per year    Active Member of [employer] or Organizations: No    Attends Archivist Meetings: 1 to 4 times per year    Marital Status: Patient refused  Intimate Partner Violence: Not At Risk (01/20/2023)   Humiliation, Afraid, Rape, and Kick questionnaire    Fear of Current or Ex-Partner: No    Emotionally Abused:  No    Physically Abused: No    Sexually Abused: No    FAMILY HISTORY: Family History  Problem Relation Age of Onset   Hypertension Father    Throat cancer Father    Heart attack Brother    Colon cancer Neg Hx    Esophageal cancer Neg Hx    Stomach cancer Neg Hx     ALLERGIES:  has No Known Allergies.  MEDICATIONS:  Scheduled Meds:  B-complex with vitamin C  1 tablet Oral Daily   bictegravir-emtricitabine-tenofovir AF  1 tablet Oral Daily   Chlorhexidine Gluconate Cloth  6 each Topical Daily   DOXOrubicin (ADRIAMYCIN) 26 mg, etoposide (VEPESID) 128 mg, vinCRIStine (ONCOVIN) 1 mg in sodium chloride 0.9 % 1,000 mL chemo infusion   Intravenous Once   enoxaparin (LOVENOX) injection  40 mg Subcutaneous Q24H   hydrocortisone   Rectal TID   lidocaine-prilocaine  1 Application Topical Once   polyethylene glycol  17 g Oral Daily   sodium bicarbonate/sodium chloride  1 Application Mouth Rinse QID   sulfamethoxazole-trimethoprim  1 tablet Oral Daily   Continuous Infusions:  sodium chloride 10 mL/hr at 01/20/23 1538   PRN Meds:.sodium chloride, acetaminophen, alum & mag hydroxide-simeth, senna-docusate  REVIEW OF SYSTEMS:    10 Point review of Systems was done is negative except as noted above.   LABORATORY DATA:  I have reviewed the data as listed  .    Latest Ref Rng & Units 01/21/2023    5:00 AM 01/20/2023   10:50 AM 01/17/2023   11:44 AM  CBC  WBC 4.0 - 10.5 K/uL 3.5  2.4  6.1   Hemoglobin 13.0 - 17.0 g/dL 8.4  8.2  8.5   Hematocrit 39.0 - 52.0 % 27.7  27.1  26.7   Platelets 150 - 400 K/uL 244  224  141     .    Latest Ref Rng & Units 01/21/2023    5:00 AM 01/20/2023   10:50 AM 01/17/2023   11:44 AM  CMP  Glucose 70 - 99 mg/dL 124  111  96   BUN 6 - 20 mg/dL '12  11  6   '$ Creatinine 0.61 - 1.24 mg/dL 0.93  0.96  1.03   Sodium 135 - 145 mmol/L 134  139  141   Potassium 3.5 - 5.1 mmol/L 4.4  3.8  4.2   Chloride 98 - 111 mmol/L 103  106  107   CO2 22 - 32 mmol/L '23  26  27    '$ Calcium 8.9 - 10.3 mg/dL 9.1  8.6  9.4   Total Protein 6.5 - 8.1 g/dL 7.3  6.6  6.6   Total Bilirubin 0.3 - 1.2 mg/dL 1.0  1.0  0.7  Alkaline Phos 38 - 126 U/L 48  49  67   AST 15 - 41 U/L '23  24  15   '$ ALT 0 - 44 U/L '17  17  16      '$ RADIOGRAPHIC STUDIES: I have personally reviewed the radiological images as listed and agreed with the findings in the report. NM PET Image Restag (PS) Skull Base To Thigh  Result Date: 01/18/2023 CLINICAL DATA:  Subsequent treatment strategy for Burkitt's lymphoma. Status post 3 cycles of chemo immunotherapy. EXAM: NUCLEAR MEDICINE PET SKULL BASE TO THIGH TECHNIQUE: 13.68 mCi F-18 FDG was injected intravenously. Full-ring PET imaging was performed from the skull base to thigh after the radiotracer. CT data was obtained and used for attenuation correction and anatomic localization. Fasting blood glucose: 96 mg/dl COMPARISON:  PET-CT 11/19/2022 FINDINGS: Mediastinal blood pool activity: SUV max 2.06 Liver activity: SUV max 4.13 NECK: No residual hypermetabolic adenopathy. The large bulk E right neck mass extending into the right subclavicular area has near completely resolved. Residual subcutaneous disease anterior to the right sternocleidomastoid muscle is noted. This measures 2.5 x 2.4 cm and previously measured 5 x 5 cm. SUV max is 3.12 and was previously 33.13. Incidental CT findings: None. CHEST: The substernal/upper right anterior mediastinal disease has resolved. No residual measurable tumor or hypermetabolism. The right axillary adenopathy the has resolved. No residual measurable disease or hypermetabolism. Incidental CT findings: Left IJ power port in good position without complicating features. No enlarged mediastinal or hilar lymph nodes. No worrisome pulmonary findings. ABDOMEN/PELVIS: No abnormal hypermetabolic activity within the liver, pancreas, adrenal glands, or spleen. No hypermetabolic lymph nodes in the abdomen or pelvis. Incidental CT findings: Thick  wall bladder with pericystic interstitial/inflammatory changes could suggest cystitis. Recommend clinical correlation. SKELETON: Diffuse marrow FDG uptake likely related to chemotherapy or marrow stimulating drugs. Incidental CT findings: None. IMPRESSION: 1. PET-CT findings suggest an excellent response to treatment ([place] 1). The large markedly hypermetabolic right nodal neck mass extending into the thoracic inlet and upper right anterior mediastinum has largely resolved. Only a small area of residual disease in the subcutaneous fat overlying the right sternocleidomastoid muscle remains ([place] 3). 2. No disease below the diaphragm. 3. Mild diffuse marrow uptake likely due to chemotherapy or marrow stimulating drugs. 4. Thick wall bladder with pericystic interstitial/inflammatory changes could suggest cystitis. Electronically Signed   By: Marijo Sanes M.D.   On: 01/18/2023 09:00   DG Chest 2 View  Result Date: 01/10/2023 CLINICAL DATA:  Shortness of breath and fever EXAM: CHEST - 2 VIEW COMPARISON:  Chest x-ray and CT 11/10/2022. FINDINGS: Left IJ chest port with tip at the SVC right atrial junction. Underinflation. Developing mild opacity at the left lung base. Slight asymmetric vascular congestion at the left hilum. No pneumothorax or effusion. There is large mint of the mediastinum particularly along the aortopulmonary window. Please correlate for any known history including abnormal nodes as seen on CT scan of 11/10/2022. This is more prominent than the prior x-ray IMPRESSION: Chest port. Underinflation with developing opacity left lung base. Possible infiltrate versus atelectasis. Recommend follow-up Widened mediastinum with fullness in the aortopulmonary window. Please correlate for known history of abnormal lymph nodes Electronically Signed   By: Jill Side M.D.   On: 01/10/2023 18:26    ASSESSMENT & PLAN:   42 year old male with HIV/AIDS with newly diagnosed Burkitt's lymphoma   #1   Stage II recently diagnosed Burkitt's lymphoma Presenting with rapidly growing right neck mass extending into the chest,  bilateral x-ray lymphadenopathy and also concern for possible involvement of retroperitoneal lymph nodes. Concerning for at least stage II possibly stage III disease. Some borderline lymphadenopathy in the abdomen could also be from his recently diagnosed HIV/AIDS. No clinical symptoms suggestive of CNS involvement at this time. Brain MRI negative.  No constitutional symptoms. No significant cytopenias to suggest bone marrow involvement. Discussed with patient and he is not interested in fertility preservation or sperm banking. First presented in August at [facility] and was treated empirically with steroids and antibiotics with improvement in swelling prior to additional progression. -Currently on dose adjusted EPOCH-R. Status post 1 cycle. Tolerated well overall.  -Scheduled for cycle #2 on 12/10/22. Will help facilitate inpatient admission. -Outpatient follow up on 12/17/22 for Rituxan and Neulasta   #2 HIV/AIDS recently diagnosed 4 months ago. Following Dr. Elna Breslow from [place] and currently taking Biktarvy.   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  Patient reports this was completely treated.   #4 pancytopenia likely due to chemotherapy.  Accentuated by the patient's underlying HIV/AIDS, antiretroviral therapy and Bactrim .   #5 h/o severe neutropenia with fever= rsolved.  Likely urinary tract infection given patient's hematuria and dysuria though it is also possible that he may have passed a kidney stone.   PLAN: -Discussed lab results from today, 01/21/2023, -Discussed with the patient that if his hemoglobin drops below 7.5, we will need blood transfusion. Patient does not want blood transfusion unless it is absolutely necessary.  -patient tolerated C3D1 of daEPOCH and is appropriate for D2 of treatment -Patient can proceed with his treatment as planned.  Will keep the doses of daEPOCH-R the same as previous treatment...will need close lab monitoring as outpatient and potentially transfusion support. -infection precautions recommended. -no further IT MTX per patient preference -outpatient Rituxan and G-CSF on 2/12 - B complex to keep with Bactrim induced cytopenias. -lovenox for VTE prophylaxis -Salt/bakind soda mouthwash -Miralaox + Senna-S for constipation prophylaxis   .The total time spent in the appointment was 25 minutes* .  All of the patient's questions were answered with apparent satisfaction. The patient knows to call the clinic with any problems, questions or concerns.   Sullivan Lone MD MS AAHIVMS [facility] Hematology/Oncology Physician [facility]  .*Total Encounter Time as defined by the Centers for Medicare and Medicaid Services includes, in addition to the face-to-face time of a patient visit (documented in the note above) non-face-to-face time: obtaining and reviewing outside history, ordering and reviewing medications, tests or procedures, care coordination (communications with other health care professionals or caregivers) and documentation in the medical record.   01/22/2023 1:23 AM

## 2023-01-23 ENCOUNTER — Other Ambulatory Visit: Payer: Self-pay

## 2023-01-23 ENCOUNTER — Encounter: Payer: Self-pay | Admitting: Hematology

## 2023-01-23 ENCOUNTER — Other Ambulatory Visit (HOSPITAL_COMMUNITY): Payer: Self-pay

## 2023-01-23 DIAGNOSIS — C8378 Burkitt lymphoma, lymph nodes of multiple sites: Secondary | ICD-10-CM | POA: Diagnosis not present

## 2023-01-23 DIAGNOSIS — E876 Hypokalemia: Secondary | ICD-10-CM

## 2023-01-23 DIAGNOSIS — Z5111 Encounter for antineoplastic chemotherapy: Secondary | ICD-10-CM | POA: Diagnosis not present

## 2023-01-23 LAB — CBC WITH DIFFERENTIAL/PLATELET
Abs Immature Granulocytes: 0.01 10*3/uL (ref 0.00–0.07)
Basophils Absolute: 0 10*3/uL (ref 0.0–0.1)
Basophils Relative: 0 %
Eosinophils Absolute: 0 10*3/uL (ref 0.0–0.5)
Eosinophils Relative: 0 %
HCT: 27.3 % — ABNORMAL LOW (ref 39.0–52.0)
Hemoglobin: 8.7 g/dL — ABNORMAL LOW (ref 13.0–17.0)
Immature Granulocytes: 0 %
Lymphocytes Relative: 22 %
Lymphs Abs: 0.6 10*3/uL — ABNORMAL LOW (ref 0.7–4.0)
MCH: 27.9 pg (ref 26.0–34.0)
MCHC: 31.9 g/dL (ref 30.0–36.0)
MCV: 87.5 fL (ref 80.0–100.0)
Monocytes Absolute: 0.4 10*3/uL (ref 0.1–1.0)
Monocytes Relative: 13 %
Neutro Abs: 1.7 10*3/uL (ref 1.7–7.7)
Neutrophils Relative %: 65 %
Platelets: 253 10*3/uL (ref 150–400)
RBC: 3.12 MIL/uL — ABNORMAL LOW (ref 4.22–5.81)
RDW: 18.7 % — ABNORMAL HIGH (ref 11.5–15.5)
WBC: 2.7 10*3/uL — ABNORMAL LOW (ref 4.0–10.5)
nRBC: 0 % (ref 0.0–0.2)

## 2023-01-23 LAB — COMPREHENSIVE METABOLIC PANEL
ALT: 16 U/L (ref 0–44)
AST: 18 U/L (ref 15–41)
Albumin: 4.2 g/dL (ref 3.5–5.0)
Alkaline Phosphatase: 42 U/L (ref 38–126)
Anion gap: 11 (ref 5–15)
BUN: 15 mg/dL (ref 6–20)
CO2: 24 mmol/L (ref 22–32)
Calcium: 8.6 mg/dL — ABNORMAL LOW (ref 8.9–10.3)
Chloride: 101 mmol/L (ref 98–111)
Creatinine, Ser: 0.83 mg/dL (ref 0.61–1.24)
GFR, Estimated: >60 mL/min (ref >60)
Glucose, Bld: 92 mg/dL (ref 70–99)
Potassium: 3.4 mmol/L — ABNORMAL LOW (ref 3.5–5.1)
Sodium: 136 mmol/L (ref 135–145)
Total Bilirubin: 1.4 mg/dL — ABNORMAL HIGH (ref 0.3–1.2)
Total Protein: 7 g/dL (ref 6.5–8.1)

## 2023-01-23 MED ORDER — SODIUM CHLORIDE 0.9 % IV SOLN
Freq: Once | INTRAVENOUS | Status: AC
  Administered 2023-01-23: 8 mg via INTRAVENOUS
  Filled 2023-01-23: qty 4

## 2023-01-23 MED ORDER — SODIUM CHLORIDE 0.9 % IV SOLN
Freq: Once | INTRAVENOUS | Status: AC
  Administered 2023-01-24: 36 mg via INTRAVENOUS
  Filled 2023-01-23: qty 8

## 2023-01-23 MED ORDER — VINCRISTINE SULFATE CHEMO INJECTION 1 MG/ML
Freq: Once | INTRAVENOUS | Status: AC
  Filled 2023-01-23: qty 13

## 2023-01-23 MED ORDER — SODIUM CHLORIDE 0.9 % IV SOLN
750.0000 mg/m2 | Freq: Once | INTRAVENOUS | Status: AC
  Administered 2023-01-24: 2000 mg via INTRAVENOUS
  Filled 2023-01-23: qty 100

## 2023-01-23 MED ORDER — POTASSIUM CHLORIDE CRYS ER 20 MEQ PO TBCR
20.0000 meq | EXTENDED_RELEASE_TABLET | Freq: Two times a day (BID) | ORAL | Status: DC
  Administered 2023-01-23 – 2023-01-24 (×3): 20 meq via ORAL
  Filled 2023-01-23 (×3): qty 1

## 2023-01-23 NOTE — Progress Notes (Signed)
[name]  HEMATOLOGY/ONCOLOGY INPATIENT PROGRESS NOTE  Date of Service: 01/22/2023  Inpatient Attending: .Brunetta Genera, MD  SUBJECTIVE  Logan Ward was seen in medical oncology follow-up.  He is receiving cycle 4-day 3 of dose adjusted EPOCH-R.  Had some grade 1 nausea earlier today which is controlled with his anti-nausea medications. No fevers no chills no night sweats.  Eating well.  No other acute new symptoms.  No tingling numbness in his hands or feet.  No shortness of breath or chest pain.  In good spirits.   OBJECTIVE:  NAD  PHYSICAL EXAMINATION: . Vitals:   01/22/23 0513 01/22/23 1341 01/22/23 2016 01/23/23 0607  BP: (!) 131/59 125/77 104/64   Pulse: 65 79 63   Resp: '14 20 17 19  '$ Temp: 98 F (36.7 C) 98.5 F (36.9 C) 98.4 F (36.9 C)   TempSrc: Oral Oral Oral   SpO2: 97% 95% 98%   Weight:      Height:       Filed Weights   01/20/23 0922  Weight: 271 lb (122.9 kg)   .Body mass index is 35.75 kg/m. [name] GENERAL:alert, in no acute distress and comfortable OROPHARYNX: MMM, no exudates, no oropharyngeal erythema or ulceration NECK: supple, no JVD LYMPH:  no palpable lymphadenopathy in the cervical, axillary or inguinal regions LUNGS: clear to auscultation b/l with normal respiratory effort HEART: regular rate & rhythm ABDOMEN:  normoactive bowel sounds , non tender, not distended. Extremity: no pedal edema PSYCH: alert & oriented x 3 with fluent speech NEURO: no focal motor/sensory deficits    MEDICAL HISTORY:  Past Medical History:  Diagnosis Date   Anal fissure    HIV infection ([place])    Internal hemorrhoids    Obesity    Rectal ulcer     SURGICAL HISTORY: Past Surgical History:  Procedure Laterality Date   IR IMAGING GUIDED PORT INSERTION  11/18/2022    SOCIAL HISTORY: Social History   Socioeconomic History   Marital status: Single    Spouse name: Not on file   Number of children: Not on file   Years of education: Not on file   Highest  education level: Not on file  Occupational History   Not on file  Tobacco Use   Smoking status: Never   Smokeless tobacco: Never  Vaping Use   Vaping Use: Never used  Substance and Sexual Activity   Alcohol use: No   Drug use: No   Sexual activity: Yes    Comment: declined condoms  Other Topics Concern   Not on file  Social History Narrative   Not on file   Social Determinants of Health   Financial Resource Strain: Low Risk  (10/24/2022)   Overall Financial Resource Strain (CARDIA)    Difficulty of Paying Living Expenses: Not hard at all  Food Insecurity: No Food Insecurity (01/20/2023)   Hunger Vital Sign    Worried About Running Out of Food in the Last Year: Never true    Ran Out of Food in the Last Year: Never true  Transportation Needs: No Transportation Needs (01/20/2023)   PRAPARE - Hydrologist (Medical): No    Lack of Transportation (Non-Medical): No  Physical Activity: Sufficiently Active (10/24/2022)   Exercise Vital Sign    Days of Exercise per Week: 3 days    Minutes of Exercise per Session: 60 min  Recent Concern: Physical Activity - Insufficiently Active (10/24/2022)   Exercise Vital Sign    Days of  Exercise per Week: 3 days    Minutes of Exercise per Session: 20 min  Stress: No Stress Concern Present (10/24/2022)   [place]    Feeling of Stress : Not at all  Social Connections: Unknown (10/24/2022)   Social Connection and Isolation Panel [NHANES]    Frequency of Communication with Friends and Family: Twice a week    Frequency of Social Gatherings with Friends and Family: Twice a week    Attends Religious Services: 1 to 4 times per year    Active Member of [employer] or Organizations: No    Attends Archivist Meetings: 1 to 4 times per year    Marital Status: Patient refused  Intimate Partner Violence: Not At Risk (01/20/2023)   Humiliation, Afraid, Rape, and Kick  questionnaire    Fear of Current or Ex-Partner: No    Emotionally Abused: No    Physically Abused: No    Sexually Abused: No    FAMILY HISTORY: Family History  Problem Relation Age of Onset   Hypertension Father    Throat cancer Father    Heart attack Brother    Colon cancer Neg Hx    Esophageal cancer Neg Hx    Stomach cancer Neg Hx     ALLERGIES:  has No Known Allergies.  MEDICATIONS:  Scheduled Meds:  B-complex with vitamin C  1 tablet Oral Daily   bictegravir-emtricitabine-tenofovir AF  1 tablet Oral Daily   Chlorhexidine Gluconate Cloth  6 each Topical Daily   DOXOrubicin (ADRIAMYCIN) 26 mg, etoposide (VEPESID) 128 mg, vinCRIStine (ONCOVIN) 1 mg in sodium chloride 0.9 % 1,000 mL chemo infusion   Intravenous Once   DOXOrubicin (ADRIAMYCIN) 26 mg, etoposide (VEPESID) 128 mg, vinCRIStine (ONCOVIN) 1 mg in sodium chloride 0.9 % 1,000 mL chemo infusion   Intravenous Once   enoxaparin (LOVENOX) injection  40 mg Subcutaneous Q24H   hydrocortisone   Rectal TID   lidocaine-prilocaine  1 Application Topical Once   polyethylene glycol  17 g Oral Daily   sodium bicarbonate/sodium chloride  1 Application Mouth Rinse QID   sulfamethoxazole-trimethoprim  1 tablet Oral Daily   Continuous Infusions:  sodium chloride 10 mL/hr at 01/22/23 0439   ondansetron (ZOFRAN) 8 mg, dexamethasone (DECADRON) 10 mg in sodium chloride 0.9 % 50 mL IVPB     PRN Meds:.sodium chloride, acetaminophen, alum & mag hydroxide-simeth, LORazepam, prochlorperazine, senna-docusate  REVIEW OF SYSTEMS:    10 Point review of Systems was done is negative except as noted above.   LABORATORY DATA:  I have reviewed the data as listed  .    Latest Ref Rng & Units 01/22/2023   10:43 AM 01/21/2023    5:00 AM 01/20/2023   10:50 AM  CBC  WBC 4.0 - 10.5 K/uL 3.2  3.5  2.4   Hemoglobin 13.0 - 17.0 g/dL 8.5  8.4  8.2   Hematocrit 39.0 - 52.0 % 27.8  27.7  27.1   Platelets 150 - 400 K/uL 279  244  224     .     Latest Ref Rng & Units 01/22/2023   10:43 AM 01/21/2023    5:00 AM 01/20/2023   10:50 AM  CMP  Glucose 70 - 99 mg/dL 168  124  111   BUN 6 - 20 mg/dL '14  12  11   '$ Creatinine 0.61 - 1.24 mg/dL 0.94  0.93  0.96   Sodium 135 - 145 mmol/L 136  134  139   Potassium 3.5 - 5.1 mmol/L 3.7  4.4  3.8   Chloride 98 - 111 mmol/L 104  103  106   CO2 22 - 32 mmol/L '21  23  26   '$ Calcium 8.9 - 10.3 mg/dL 8.8  9.1  8.6   Total Protein 6.5 - 8.1 g/dL 7.0  7.3  6.6   Total Bilirubin 0.3 - 1.2 mg/dL 1.4  1.0  1.0   Alkaline Phos 38 - 126 U/L 47  48  49   AST 15 - 41 U/L '24  23  24   '$ ALT 0 - 44 U/L '16  17  17      '$ RADIOGRAPHIC STUDIES: I have personally reviewed the radiological images as listed and agreed with the findings in the report. NM PET Image Restag (PS) Skull Base To Thigh  Result Date: 01/18/2023 CLINICAL DATA:  Subsequent treatment strategy for Burkitt's lymphoma. Status post 3 cycles of chemo immunotherapy. EXAM: NUCLEAR MEDICINE PET SKULL BASE TO THIGH TECHNIQUE: 13.68 mCi F-18 FDG was injected intravenously. Full-ring PET imaging was performed from the skull base to thigh after the radiotracer. CT data was obtained and used for attenuation correction and anatomic localization. Fasting blood glucose: 96 mg/dl COMPARISON:  PET-CT 11/19/2022 FINDINGS: Mediastinal blood pool activity: SUV max 2.06 Liver activity: SUV max 4.13 NECK: No residual hypermetabolic adenopathy. The large bulk E right neck mass extending into the right subclavicular area has near completely resolved. Residual subcutaneous disease anterior to the right sternocleidomastoid muscle is noted. This measures 2.5 x 2.4 cm and previously measured 5 x 5 cm. SUV max is 3.12 and was previously 33.13. Incidental CT findings: None. CHEST: The substernal/upper right anterior mediastinal disease has resolved. No residual measurable tumor or hypermetabolism. The right axillary adenopathy the has resolved. No residual measurable disease or  hypermetabolism. Incidental CT findings: Left IJ power port in good position without complicating features. No enlarged mediastinal or hilar lymph nodes. No worrisome pulmonary findings. ABDOMEN/PELVIS: No abnormal hypermetabolic activity within the liver, pancreas, adrenal glands, or spleen. No hypermetabolic lymph nodes in the abdomen or pelvis. Incidental CT findings: Thick wall bladder with pericystic interstitial/inflammatory changes could suggest cystitis. Recommend clinical correlation. SKELETON: Diffuse marrow FDG uptake likely related to chemotherapy or marrow stimulating drugs. Incidental CT findings: None. IMPRESSION: 1. PET-CT findings suggest an excellent response to treatment ([place] 1). The large markedly hypermetabolic right nodal neck mass extending into the thoracic inlet and upper right anterior mediastinum has largely resolved. Only a small area of residual disease in the subcutaneous fat overlying the right sternocleidomastoid muscle remains ([place] 3). 2. No disease below the diaphragm. 3. Mild diffuse marrow uptake likely due to chemotherapy or marrow stimulating drugs. 4. Thick wall bladder with pericystic interstitial/inflammatory changes could suggest cystitis. Electronically Signed   By: Marijo Sanes M.D.   On: 01/18/2023 09:00   DG Chest 2 View  Result Date: 01/10/2023 CLINICAL DATA:  Shortness of breath and fever EXAM: CHEST - 2 VIEW COMPARISON:  Chest x-ray and CT 11/10/2022. FINDINGS: Left IJ chest port with tip at the SVC right atrial junction. Underinflation. Developing mild opacity at the left lung base. Slight asymmetric vascular congestion at the left hilum. No pneumothorax or effusion. There is large mint of the mediastinum particularly along the aortopulmonary window. Please correlate for any known history including abnormal nodes as seen on CT scan of 11/10/2022. This is more prominent than the prior x-ray IMPRESSION: Chest port. Underinflation with developing  opacity left  lung base. Possible infiltrate versus atelectasis. Recommend follow-up Widened mediastinum with fullness in the aortopulmonary window. Please correlate for known history of abnormal lymph nodes Electronically Signed   By: Jill Side M.D.   On: 01/10/2023 18:26    ASSESSMENT & PLAN:   42 year old male with HIV/AIDS with newly diagnosed Burkitt's lymphoma   #1  Stage II recently diagnosed Burkitt's lymphoma Presenting with rapidly growing right neck mass extending into the chest, bilateral x-ray lymphadenopathy and also concern for possible involvement of retroperitoneal lymph nodes. Concerning for at least stage II possibly stage III disease. Some borderline lymphadenopathy in the abdomen could also be from his recently diagnosed HIV/AIDS. No clinical symptoms suggestive of CNS involvement at this time. Brain MRI negative.  No constitutional symptoms. No significant cytopenias to suggest bone marrow involvement. Discussed with patient and he is not interested in fertility preservation or sperm banking. First presented in August at [facility] and was treated empirically with steroids and antibiotics with improvement in swelling prior to additional progression. -Currently on dose adjusted EPOCH-R. Status post 1 cycle. Tolerated well overall.  -Scheduled for cycle #2 on 12/10/22. Will help facilitate inpatient admission. -Outpatient follow up on 12/17/22 for Rituxan and Neulasta   #2 HIV/AIDS recently diagnosed 4 months ago. Following Dr. Elna Breslow from [place] and currently taking Biktarvy.   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  Patient reports this was completely treated.   #4 pancytopenia likely due to chemotherapy.  Accentuated by the patient's underlying HIV/AIDS, antiretroviral therapy and Bactrim .   #5 h/o severe neutropenia with fever= rsolved.  Likely urinary tract infection given patient's hematuria and dysuria though it is also possible that he may have  passed a kidney stone.   PLAN: -Discussed lab results from today, 01/22/2023 CBC stable with a hemoglobin of 8.5, ANC of 2.3k normal platelets of 279k --patient tolerated C3D2 of daEPOCH and is appropriate for D3 of treatment -Patient can proceed with his treatment as planned. Will keep the doses of daEPOCH-R the same as previous treatment...will need close lab monitoring as outpatient and potentially transfusion support. -infection precautions recommended. -no further IT MTX per patient preference -outpatient Rituxan and G-CSF on 2/12 - B complex to help with Bactrim induced cytopenias. -lovenox for VTE prophylaxis -Salt/bakind soda mouthwash -Miralaox + Senna-S for constipation prophylaxis  .The total time spent in the appointment was 25 minutes* .  All of the patient's questions were answered with apparent satisfaction. The patient knows to call the clinic with any problems, questions or concerns.   Sullivan Lone MD MS AAHIVMS [facility] Hematology/Oncology Physician [facility]  .*Total Encounter Time as defined by the Centers for Medicare and Medicaid Services includes, in addition to the face-to-face time of a patient visit (documented in the note above) non-face-to-face time: obtaining and reviewing outside history, ordering and reviewing medications, tests or procedures, care coordination (communications with other health care professionals or caregivers) and documentation in the medical record.

## 2023-01-23 NOTE — Progress Notes (Signed)
[facility]  HEMATOLOGY/ONCOLOGY INPATIENT PROGRESS NOTE  Date of Service: 01/22/2023  Inpatient Attending: .Brunetta Genera, MD  SUBJECTIVE Patient was seen for medical oncology follow-up.  He is receiving his cycle 4-day 4 of dose adjusted EPOCH-R chemotherapy without any acute toxicities.  He notes that he did not sleep very well last night since he had to be evaluated multiple times during the night for chemotherapy monitoring. No fevers no chills no night sweats.  No uncontrolled nausea or vomiting.  Has been using his laxatives and feels like he might need to have a bowel movement today. No shortness of breath or chest pain. Notes dry skin. Has been using salt and baking soda mouthwashes. Notes some dysgeusia but no obvious oral soreness or ulcers. 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  OBJECTIVE:  NAD  PHYSICAL EXAMINATION: . Vitals:   01/22/23 0513 01/22/23 1341 01/22/23 2016 01/23/23 0607  BP: (!) 131/59 125/77 104/64   Pulse: 65 79 63   Resp: '14 20 17 19  '$ Temp: 98 F (36.7 C) 98.5 F (36.9 C) 98.4 F (36.9 C)   TempSrc: Oral Oral Oral   SpO2: 97% 95% 98%   Weight:      Height:       Filed Weights   01/20/23 0922  Weight: 271 lb (122.9 kg)   .Body mass index is 35.75 kg/m. [facility] GENERAL:alert, in no acute distress and comfortable OROPHARYNX: MMM, no exudates, no oropharyngeal erythema or ulceration NECK: supple, no JVD LYMPH:  no palpable lymphadenopathy in the cervical, axillary or inguinal regions LUNGS: clear to auscultation b/l with normal respiratory effort HEART: regular rate & rhythm ABDOMEN:  normoactive bowel sounds , non tender, not distended. Extremity: no pedal edema PSYCH: alert & oriented x 3 with fluent speech NEURO: no focal motor/sensory deficits  MEDICAL HISTORY:  Past Medical History:  Diagnosis Date   Anal fissure    HIV  infection ([place])    Internal hemorrhoids    Obesity    Rectal ulcer     SURGICAL HISTORY: Past Surgical History:  Procedure Laterality Date   IR IMAGING GUIDED PORT INSERTION  11/18/2022    SOCIAL HISTORY: Social History   Socioeconomic History   Marital status: Single    Spouse name: Not on file   Number of children: Not on file   Years of education: Not on file   Highest education level: Not on file  Occupational History   Not on file  Tobacco Use   Smoking status: Never   Smokeless tobacco: Never  Vaping Use   Vaping Use: Never used  Substance and Sexual Activity   Alcohol use: No   Drug use: No   Sexual activity: Yes    Comment: declined condoms  Other Topics Concern   Not on file  Social History Narrative   Not on file   Social Determinants of Health   Financial Resource Strain: Low Risk  (10/24/2022)   Overall Financial Resource Strain (CARDIA)    Difficulty of Paying Living Expenses: Not hard at all  Food Insecurity: No Food Insecurity (01/20/2023)   Hunger Vital Sign    Worried About Running Out of Food in the Last Year: Never true    Ran Out of Food in the Last Year: Never true  Transportation Needs: No Transportation Needs (01/20/2023)   PRAPARE - Transportation    Lack  of Transportation (Medical): No    Lack of Transportation (Non-Medical): No  Physical Activity: Sufficiently Active (10/24/2022)   Exercise Vital Sign    Days of Exercise per Week: 3 days    Minutes of Exercise per Session: 60 min  Recent Concern: Physical Activity - Insufficiently Active (10/24/2022)   Exercise Vital Sign    Days of Exercise per Week: 3 days    Minutes of Exercise per Session: 20 min  Stress: No Stress Concern Present (10/24/2022)   [place] of Stress : Not at all  Social Connections: Unknown (10/24/2022)   Social Connection and Isolation Panel [NHANES]    Frequency of Communication with  Friends and Family: Twice a week    Frequency of Social Gatherings with Friends and Family: Twice a week    Attends Religious Services: 1 to 4 times per year    Active Member of [employer] or Organizations: No    Attends Archivist Meetings: 1 to 4 times per year    Marital Status: Patient refused  Intimate Partner Violence: Not At Risk (01/20/2023)   Humiliation, Afraid, Rape, and Kick questionnaire    Fear of Current or Ex-Partner: No    Emotionally Abused: No    Physically Abused: No    Sexually Abused: No    FAMILY HISTORY: Family History  Problem Relation Age of Onset   Hypertension Father    Throat cancer Father    Heart attack Brother    Colon cancer Neg Hx    Esophageal cancer Neg Hx    Stomach cancer Neg Hx     ALLERGIES:  has No Known Allergies.  MEDICATIONS:  Scheduled Meds:  B-complex with vitamin C  1 tablet Oral Daily   bictegravir-emtricitabine-tenofovir AF  1 tablet Oral Daily   Chlorhexidine Gluconate Cloth  6 each Topical Daily   DOXOrubicin (ADRIAMYCIN) 26 mg, etoposide (VEPESID) 128 mg, vinCRIStine (ONCOVIN) 1 mg in sodium chloride 0.9 % 1,000 mL chemo infusion   Intravenous Once   DOXOrubicin (ADRIAMYCIN) 26 mg, etoposide (VEPESID) 128 mg, vinCRIStine (ONCOVIN) 1 mg in sodium chloride 0.9 % 1,000 mL chemo infusion   Intravenous Once   enoxaparin (LOVENOX) injection  40 mg Subcutaneous Q24H   hydrocortisone   Rectal TID   lidocaine-prilocaine  1 Application Topical Once   polyethylene glycol  17 g Oral Daily   sodium bicarbonate/sodium chloride  1 Application Mouth Rinse QID   sulfamethoxazole-trimethoprim  1 tablet Oral Daily   Continuous Infusions:  sodium chloride 10 mL/hr at 01/22/23 0439   ondansetron (ZOFRAN) 8 mg, dexamethasone (DECADRON) 10 mg in sodium chloride 0.9 % 50 mL IVPB     PRN Meds:.sodium chloride, acetaminophen, alum & mag hydroxide-simeth, LORazepam, prochlorperazine, senna-docusate  REVIEW OF SYSTEMS:    10 Point review of  Systems was done is negative except as noted above.   LABORATORY DATA:  I have reviewed the data as listed  .    Latest Ref Rng & Units 01/22/2023   10:43 AM 01/21/2023    5:00 AM 01/20/2023   10:50 AM  CBC  WBC 4.0 - 10.5 K/uL 3.2  3.5  2.4   Hemoglobin 13.0 - 17.0 g/dL 8.5  8.4  8.2   Hematocrit 39.0 - 52.0 % 27.8  27.7  27.1   Platelets 150 - 400 K/uL 279  244  224     .    Latest Ref Rng & Units 01/22/2023  10:43 AM 01/21/2023    5:00 AM 01/20/2023   10:50 AM  CMP  Glucose 70 - 99 mg/dL 168  124  111   BUN 6 - 20 mg/dL '14  12  11   '$ Creatinine 0.61 - 1.24 mg/dL 0.94  0.93  0.96   Sodium 135 - 145 mmol/L 136  134  139   Potassium 3.5 - 5.1 mmol/L 3.7  4.4  3.8   Chloride 98 - 111 mmol/L 104  103  106   CO2 22 - 32 mmol/L '21  23  26   '$ Calcium 8.9 - 10.3 mg/dL 8.8  9.1  8.6   Total Protein 6.5 - 8.1 g/dL 7.0  7.3  6.6   Total Bilirubin 0.3 - 1.2 mg/dL 1.4  1.0  1.0   Alkaline Phos 38 - 126 U/L 47  48  49   AST 15 - 41 U/L '24  23  24   '$ ALT 0 - 44 U/L '16  17  17      '$ RADIOGRAPHIC STUDIES: I have personally reviewed the radiological images as listed and agreed with the findings in the report. NM PET Image Restag (PS) Skull Base To Thigh  Result Date: 01/18/2023 CLINICAL DATA:  Subsequent treatment strategy for Burkitt's lymphoma. Status post 3 cycles of chemo immunotherapy. EXAM: NUCLEAR MEDICINE PET SKULL BASE TO THIGH TECHNIQUE: 13.68 mCi F-18 FDG was injected intravenously. Full-ring PET imaging was performed from the skull base to thigh after the radiotracer. CT data was obtained and used for attenuation correction and anatomic localization. Fasting blood glucose: 96 mg/dl COMPARISON:  PET-CT 11/19/2022 FINDINGS: Mediastinal blood pool activity: SUV max 2.06 Liver activity: SUV max 4.13 NECK: No residual hypermetabolic adenopathy. The large bulk E right neck mass extending into the right subclavicular area has near completely resolved. Residual subcutaneous disease anterior to the  right sternocleidomastoid muscle is noted. This measures 2.5 x 2.4 cm and previously measured 5 x 5 cm. SUV max is 3.12 and was previously 33.13. Incidental CT findings: None. CHEST: The substernal/upper right anterior mediastinal disease has resolved. No residual measurable tumor or hypermetabolism. The right axillary adenopathy the has resolved. No residual measurable disease or hypermetabolism. Incidental CT findings: Left IJ power port in good position without complicating features. No enlarged mediastinal or hilar lymph nodes. No worrisome pulmonary findings. ABDOMEN/PELVIS: No abnormal hypermetabolic activity within the liver, pancreas, adrenal glands, or spleen. No hypermetabolic lymph nodes in the abdomen or pelvis. Incidental CT findings: Thick wall bladder with pericystic interstitial/inflammatory changes could suggest cystitis. Recommend clinical correlation. SKELETON: Diffuse marrow FDG uptake likely related to chemotherapy or marrow stimulating drugs. Incidental CT findings: None. IMPRESSION: 1. PET-CT findings suggest an excellent response to treatment ([place] 1). The large markedly hypermetabolic right nodal neck mass extending into the thoracic inlet and upper right anterior mediastinum has largely resolved. Only a small area of residual disease in the subcutaneous fat overlying the right sternocleidomastoid muscle remains ([place] 3). 2. No disease below the diaphragm. 3. Mild diffuse marrow uptake likely due to chemotherapy or marrow stimulating drugs. 4. Thick wall bladder with pericystic interstitial/inflammatory changes could suggest cystitis. Electronically Signed   By: Marijo Sanes M.D.   On: 01/18/2023 09:00   DG Chest 2 View  Result Date: 01/10/2023 CLINICAL DATA:  Shortness of breath and fever EXAM: CHEST - 2 VIEW COMPARISON:  Chest x-ray and CT 11/10/2022. FINDINGS: Left IJ chest port with tip at the SVC right atrial junction. Underinflation. Developing mild opacity at  the left  lung base. Slight asymmetric vascular congestion at the left hilum. No pneumothorax or effusion. There is large mint of the mediastinum particularly along the aortopulmonary window. Please correlate for any known history including abnormal nodes as seen on CT scan of 11/10/2022. This is more prominent than the prior x-ray IMPRESSION: Chest port. Underinflation with developing opacity left lung base. Possible infiltrate versus atelectasis. Recommend follow-up Widened mediastinum with fullness in the aortopulmonary window. Please correlate for known history of abnormal lymph nodes Electronically Signed   By: Jill Side M.D.   On: 01/10/2023 18:26    ASSESSMENT & PLAN:   42 year old male with HIV/AIDS with newly diagnosed Burkitt's lymphoma   #1  Stage II recently diagnosed Burkitt's lymphoma Presenting with rapidly growing right neck mass extending into the chest, bilateral x-ray lymphadenopathy and also concern for possible involvement of retroperitoneal lymph nodes. Concerning for at least stage II possibly stage III disease. Some borderline lymphadenopathy in the abdomen could also be from his recently diagnosed HIV/AIDS. No clinical symptoms suggestive of CNS involvement at this time. Brain MRI negative.  No constitutional symptoms. No significant cytopenias to suggest bone marrow involvement. Discussed with patient and he is not interested in fertility preservation or sperm banking. First presented in August at [facility] and was treated empirically with steroids and antibiotics with improvement in swelling prior to additional progression. -Currently on dose adjusted EPOCH-R. Status post 1 cycle. Tolerated well overall.  -Scheduled for cycle #2 on 12/10/22. Will help facilitate inpatient admission. -Outpatient follow up on 12/17/22 for Rituxan and Neulasta   #2 HIV/AIDS recently diagnosed 4 months ago. Following Dr. Elna Breslow from [place] and currently taking Biktarvy.   #3 history of remote  syphilis 3 to 4 years ago .  Patient reports this was completely treated.   #4 pancytopenia likely due to chemotherapy.  Accentuated by the patient's underlying HIV/AIDS, antiretroviral therapy and Bactrim .   #5 h/o severe neutropenia with fever= rsolved.  Likely urinary tract infection given patient's hematuria and dysuria though it is also possible that he may have passed a kidney stone.   PLAN: -Discussed lab results from today, 01/23/2023 CBC stable with a hemoglobin of 8.7, ANC of 1.7k normal platelets of 253k Cmp stable with mild hypokalemia K 3.4 -Replacing with potassium and chloride controlled release 20 mEq twice a day for 6 doses. --patient tolerated C3D3 of daEPOCH and is appropriate for D4 of treatment -Patient can proceed with his treatment as planned. Will keep the doses of daEPOCH-R the same as previous treatment...will need close lab monitoring as outpatient and potentially transfusion support. -outpatient Rituxan and G-CSF on 2/12 - B complex to help with Bactrim induced cytopenias. -Patient prefers to not have Lovenox for DVT prophylaxis and was recommended SCDs and to ambulate -Salt/baking soda mouthwash -Miralaox + Senna-S for constipation prophylaxis  .The total time spent in the appointment was 25 minutes* .  All of the patient's questions were answered with apparent satisfaction. The patient knows to call the clinic with any problems, questions or concerns.   Sullivan Lone MD MS AAHIVMS [facility] Hematology/Oncology Physician [facility]  .*Total Encounter Time as defined by the Centers for Medicare and Medicaid Services includes, in addition to the face-to-face time of a patient visit (documented in the note above) non-face-to-face time: obtaining and reviewing outside history, ordering and reviewing medications, tests or procedures, care coordination (communications with other health care professionals or caregivers) and documentation in the medical  record.

## 2023-01-24 ENCOUNTER — Other Ambulatory Visit (HOSPITAL_COMMUNITY): Payer: Self-pay

## 2023-01-24 ENCOUNTER — Encounter: Payer: Self-pay | Admitting: Hematology

## 2023-01-24 DIAGNOSIS — C8378 Burkitt lymphoma, lymph nodes of multiple sites: Secondary | ICD-10-CM | POA: Diagnosis not present

## 2023-01-24 DIAGNOSIS — Z5111 Encounter for antineoplastic chemotherapy: Secondary | ICD-10-CM | POA: Diagnosis not present

## 2023-01-24 LAB — COMPREHENSIVE METABOLIC PANEL
ALT: 15 U/L (ref 0–44)
AST: 19 U/L (ref 15–41)
Albumin: 3.8 g/dL (ref 3.5–5.0)
Alkaline Phosphatase: 37 U/L — ABNORMAL LOW (ref 38–126)
Anion gap: 9 (ref 5–15)
BUN: 14 mg/dL (ref 6–20)
CO2: 24 mmol/L (ref 22–32)
Calcium: 8.7 mg/dL — ABNORMAL LOW (ref 8.9–10.3)
Chloride: 100 mmol/L (ref 98–111)
Creatinine, Ser: 0.89 mg/dL (ref 0.61–1.24)
GFR, Estimated: >60 mL/min (ref >60)
Glucose, Bld: 103 mg/dL — ABNORMAL HIGH (ref 70–99)
Potassium: 3.9 mmol/L (ref 3.5–5.1)
Sodium: 133 mmol/L — ABNORMAL LOW (ref 135–145)
Total Bilirubin: 1.4 mg/dL — ABNORMAL HIGH (ref 0.3–1.2)
Total Protein: 6.7 g/dL (ref 6.5–8.1)

## 2023-01-24 LAB — CBC WITH DIFFERENTIAL/PLATELET
Abs Immature Granulocytes: 0.01 10*3/uL (ref 0.00–0.07)
Basophils Absolute: 0 10*3/uL (ref 0.0–0.1)
Basophils Relative: 0 %
Eosinophils Absolute: 0 10*3/uL (ref 0.0–0.5)
Eosinophils Relative: 0 %
HCT: 26 % — ABNORMAL LOW (ref 39.0–52.0)
Hemoglobin: 8.4 g/dL — ABNORMAL LOW (ref 13.0–17.0)
Immature Granulocytes: 1 %
Lymphocytes Relative: 25 %
Lymphs Abs: 0.4 10*3/uL — ABNORMAL LOW (ref 0.7–4.0)
MCH: 28 pg (ref 26.0–34.0)
MCHC: 32.3 g/dL (ref 30.0–36.0)
MCV: 86.7 fL (ref 80.0–100.0)
Monocytes Absolute: 0.1 10*3/uL (ref 0.1–1.0)
Monocytes Relative: 5 %
Neutro Abs: 1 10*3/uL — ABNORMAL LOW (ref 1.7–7.7)
Neutrophils Relative %: 69 %
Platelets: 275 10*3/uL (ref 150–400)
RBC: 3 MIL/uL — ABNORMAL LOW (ref 4.22–5.81)
RDW: 18.1 % — ABNORMAL HIGH (ref 11.5–15.5)
WBC: 1.5 10*3/uL — ABNORMAL LOW (ref 4.0–10.5)
nRBC: 0 % (ref 0.0–0.2)

## 2023-01-24 MED ORDER — HEPARIN SOD (PORK) LOCK FLUSH 100 UNIT/ML IV SOLN
500.0000 [IU] | Freq: Once | INTRAVENOUS | Status: AC
  Administered 2023-01-24: 500 [IU] via INTRAVENOUS
  Filled 2023-01-24: qty 5

## 2023-01-24 MED ORDER — NYSTATIN 100000 UNIT/ML MT SUSP
5.0000 mL | Freq: Four times a day (QID) | OROMUCOSAL | 0 refills | Status: DC | PRN
  Filled 2023-01-24: qty 50, 3d supply, fill #0

## 2023-01-24 MED ORDER — AMOXICILLIN-POT CLAVULANATE 875-125 MG PO TABS
1.0000 | ORAL_TABLET | Freq: Two times a day (BID) | ORAL | 0 refills | Status: DC
  Filled 2023-01-24: qty 14, 7d supply, fill #0

## 2023-01-24 MED ORDER — B COMPLEX-C PO TABS
1.0000 | ORAL_TABLET | Freq: Every day | ORAL | 2 refills | Status: DC
  Filled 2023-01-24: qty 30, 30d supply, fill #0

## 2023-01-24 NOTE — H&P (Signed)
HEMATOLOGY/ONCOLOGY INPATIENT H&P NOTE  Date of Service: 01/20/2023  Inpatient Attending: .Brunetta Genera, MD   HPI Logan Ward is a wonderful 42 y.o. male with a history of HIV/AIDS(CD count of 190 on 12/19/2022) on Biktarvy who is being admitted for cycle 4 of inpatient dose adjusted EPOCH-R chemotherapy.  Patient was recently admitted for neutropenic fever and was noted to have UTI. WBC counts bounced abck rapidly from G-CSF shot. Patient has since had a PET/CT which has shown excellent response to treatment of his Burkitts lymphoma.  He has had 2 doses of IT MTX for CNS prophylaxis but has declined further IT MTX.  He has been compliant with his HAART treatment, OI prophylaxis infection issues. Headaches have resolved.  No other acute new focal neurological symptoms. No fevers no chills no night sweats no new lumps or bumps. Good p.o. intake and hydration. Urinary symptoms resolved.  I discussed with the patient that if his hemoglobin decreases to 7.5 or below, we will need blood transfusion. Patient notes he does not want blood transfusion unless it is absolutely necessary.    PHYSICAL EXAMINATION: . Vitals:   01/20/23 0922  BP: 120/76  Pulse: 98  Resp: 18  Temp: 98.6 F (37 C)  TempSrc: Oral  SpO2: 100%  Weight: 122.9 kg  Height: 6' 1"$  (1.854 m)   Filed Weights   01/20/23 0922  Weight: 122.9 kg   .Body mass index is 35.75 kg/m.  GENERAL:alert, in no acute distress and comfortable SKIN: skin color, texture, turgor are normal, no rashes or significant lesions EYES: normal, conjunctiva are pink and non-injected, sclera clear OROPHARYNX:no exudate, no erythema and lips, buccal mucosa, and tongue normal  NECK: supple, no JVD, thyroid normal size, non-tender, without nodularity LYMPH:  no palpable lymphadenopathy in the cervical, axillary or inguinal LUNGS: clear to auscultation with normal respiratory effort HEART: regular rate & rhythm,  no murmurs and  no lower extremity edema ABDOMEN: abdomen soft, non-tender, normoactive bowel sounds  Musculoskeletal: no cyanosis of digits and no clubbing  PSYCH: alert & oriented x 3 with fluent speech NEURO: no focal motor/sensory deficits  MEDICAL HISTORY:  Past Medical History:  Diagnosis Date   Anal fissure    HIV infection ([place])    Internal hemorrhoids    Obesity    Rectal ulcer     SURGICAL HISTORY: Past Surgical History:  Procedure Laterality Date   IR IMAGING GUIDED PORT INSERTION  11/18/2022    SOCIAL HISTORY: Social History   Socioeconomic History   Marital status: Single    Spouse name: Not on file   Number of children: Not on file   Years of education: Not on file   Highest education level: Not on file  Occupational History   Not on file  Tobacco Use   Smoking status: Never   Smokeless tobacco: Never  Vaping Use   Vaping Use: Never used  Substance and Sexual Activity   Alcohol use: No   Drug use: No   Sexual activity: Yes    Comment: declined condoms  Other Topics Concern   Not on file  Social History Narrative   Not on file   Social Determinants of Health   Financial Resource Strain: Low Risk  (10/24/2022)   Overall Financial Resource Strain (CARDIA)    Difficulty of Paying Living Expenses: Not hard at all  Food Insecurity: No Food Insecurity (01/11/2023)   Hunger Vital Sign    Worried About Running Out of Food in the  Last Year: Never true    [place] in the Last Year: Never true  Transportation Needs: No Transportation Needs (01/11/2023)   PRAPARE - Hydrologist (Medical): No    Lack of Transportation (Non-Medical): No  Physical Activity: Sufficiently Active (10/24/2022)   Exercise Vital Sign    Days of Exercise per Week: 3 days    Minutes of Exercise per Session: 60 min  Recent Concern: Physical Activity - Insufficiently Active (10/24/2022)   Exercise Vital Sign    Days of Exercise per Week: 3 days    Minutes of  Exercise per Session: 20 min  Stress: No Stress Concern Present (10/24/2022)   [place] of Stress : Not at all  Social Connections: Unknown (10/24/2022)   Social Connection and Isolation Panel [NHANES]    Frequency of Communication with Friends and Family: Twice a week    Frequency of Social Gatherings with Friends and Family: Twice a week    Attends Religious Services: 1 to 4 times per year    Active Member of [employer] or Organizations: No    Attends Archivist Meetings: 1 to 4 times per year    Marital Status: Patient refused  Intimate Partner Violence: Not At Risk (01/11/2023)   Humiliation, Afraid, Rape, and Kick questionnaire    Fear of Current or Ex-Partner: No    Emotionally Abused: No    Physically Abused: No    Sexually Abused: No    FAMILY HISTORY: Family History  Problem Relation Age of Onset   Hypertension Father    Throat cancer Father    Heart attack Brother    Colon cancer Neg Hx    Esophageal cancer Neg Hx    Stomach cancer Neg Hx     ALLERGIES:  has No Known Allergies.  MEDICATIONS:  Scheduled Meds:  bictegravir-emtricitabine-tenofovir AF  1 tablet Oral Daily   enoxaparin (LOVENOX) injection  40 mg Subcutaneous Q24H   hydrocortisone   Rectal TID   lidocaine-prilocaine  1 Application Topical Once   polyethylene glycol  17 g Oral Daily   sulfamethoxazole-trimethoprim  1 tablet Oral Daily   Continuous Infusions: PRN Meds:.acetaminophen, alum & mag hydroxide-simeth, senna-docusate, sodium bicarbonate/sodium chloride  REVIEW OF SYSTEMS:    10 Point review of Systems was done is negative except as noted above.   LABORATORY DATA:  I have reviewed the data as listed .    Latest Ref Rng & Units 01/21/2023    5:00 AM 01/20/2023   10:50 AM 01/17/2023   11:44 AM  CBC  WBC 4.0 - 10.5 K/uL 3.5  2.4  6.1   Hemoglobin 13.0 - 17.0 g/dL 8.4  8.2  8.5   Hematocrit 39.0 - 52.0 % 27.7   27.1  26.7   Platelets 150 - 400 K/uL 244  224  141    .    Latest Ref Rng & Units 01/21/2023    5:00 AM 01/20/2023   10:50 AM 01/17/2023   11:44 AM  CMP  Glucose 70 - 99 mg/dL 124  111  96   BUN 6 - 20 mg/dL 12  11  6   $ Creatinine 0.61 - 1.24 mg/dL 0.93  0.96  1.03   Sodium 135 - 145 mmol/L 134  139  141   Potassium 3.5 - 5.1 mmol/L 4.4  3.8  4.2   Chloride 98 - 111 mmol/L 103  106  107   CO2 22 - 32 mmol/L 23  26  27   $ Calcium 8.9 - 10.3 mg/dL 9.1  8.6  9.4   Total Protein 6.5 - 8.1 g/dL 7.3  6.6  6.6   Total Bilirubin 0.3 - 1.2 mg/dL 1.0  1.0  0.7   Alkaline Phos 38 - 126 U/L 48  49  67   AST 15 - 41 U/L 23  24  15   $ ALT 0 - 44 U/L 17  17  16    $ Component     Latest Ref Rng 01/20/2023  Specimen Source URINE, CLEAN CATCH   Color, Urine     YELLOW  YELLOW   Appearance     CLEAR  CLEAR   Specific Gravity, Urine     1.005 - 1.030  1.020   pH     5.0 - 8.0  7.0   Glucose, UA     NEGATIVE mg/dL NEGATIVE   Hgb urine dipstick     NEGATIVE  NEGATIVE   Bilirubin Urine     NEGATIVE  NEGATIVE   Ketones, ur     NEGATIVE mg/dL NEGATIVE   Protein     NEGATIVE mg/dL NEGATIVE   Nitrite     NEGATIVE  NEGATIVE   Leukocytes,Ua     NEGATIVE  NEGATIVE   RBC / HPF     0 - 5 RBC/hpf 0-5   WBC, UA     0 - 5 WBC/hpf 0-5   Bacteria, UA     NONE SEEN  NONE SEEN   Squamous Epithelial / HPF     0 - 5 /HPF 0-5   Mucus PRESENT   Uric Acid, Serum     3.7 - 8.6 mg/dL 9.5 (H)   Magnesium     1.7 - 2.4 mg/dL 2.5 (H)   Phosphorus     2.5 - 4.6 mg/dL 4.1     Legend: (H) High . RADIOGRAPHIC STUDIES: I have personally reviewed the radiological images as listed and agreed with the findings in the report. NM PET Image Restag (PS) Skull Base To Thigh  Result Date: 01/18/2023 CLINICAL DATA:  Subsequent treatment strategy for Burkitt's lymphoma. Status post 3 cycles of chemo immunotherapy. EXAM: NUCLEAR MEDICINE PET SKULL BASE TO THIGH TECHNIQUE: 13.68 mCi F-18 FDG was injected intravenously.  Full-ring PET imaging was performed from the skull base to thigh after the radiotracer. CT data was obtained and used for attenuation correction and anatomic localization. Fasting blood glucose: 96 mg/dl COMPARISON:  PET-CT 11/19/2022 FINDINGS: Mediastinal blood pool activity: SUV max 2.06 Liver activity: SUV max 4.13 NECK: No residual hypermetabolic adenopathy. The large bulk E right neck mass extending into the right subclavicular area has near completely resolved. Residual subcutaneous disease anterior to the right sternocleidomastoid muscle is noted. This measures 2.5 x 2.4 cm and previously measured 5 x 5 cm. SUV max is 3.12 and was previously 33.13. Incidental CT findings: None. CHEST: The substernal/upper right anterior mediastinal disease has resolved. No residual measurable tumor or hypermetabolism. The right axillary adenopathy the has resolved. No residual measurable disease or hypermetabolism. Incidental CT findings: Left IJ power port in good position without complicating features. No enlarged mediastinal or hilar lymph nodes. No worrisome pulmonary findings. ABDOMEN/PELVIS: No abnormal hypermetabolic activity within the liver, pancreas, adrenal glands, or spleen. No hypermetabolic lymph nodes in the abdomen or pelvis. Incidental CT findings: Thick wall bladder with pericystic interstitial/inflammatory changes could suggest cystitis. Recommend clinical correlation. SKELETON: Diffuse marrow FDG  uptake likely related to chemotherapy or marrow stimulating drugs. Incidental CT findings: None. IMPRESSION: 1. PET-CT findings suggest an excellent response to treatment ([place] 1). The large markedly hypermetabolic right nodal neck mass extending into the thoracic inlet and upper right anterior mediastinum has largely resolved. Only a small area of residual disease in the subcutaneous fat overlying the right sternocleidomastoid muscle remains (Deau[place] 3). 2. No disease below the diaphragm. 3. Mild diffuse  marrow uptake likely due to chemotherapy or marrow stimulating drugs. 4. Thick wall bladder with pericystic interstitial/inflammatory changes could suggest cystitis. Electronically Signed   By: Marijo Sanes M.D.   On: 01/18/2023 09:00   DG Chest 2 View  Result Date: 01/10/2023 CLINICAL DATA:  Shortness of breath and fever EXAM: CHEST - 2 VIEW COMPARISON:  Chest x-ray and CT 11/10/2022. FINDINGS: Left IJ chest port with tip at the SVC right atrial junction. Underinflation. Developing mild opacity at the left lung base. Slight asymmetric vascular congestion at the left hilum. No pneumothorax or effusion. There is large mint of the mediastinum particularly along the aortopulmonary window. Please correlate for any known history including abnormal nodes as seen on CT scan of 11/10/2022. This is more prominent than the prior x-ray IMPRESSION: Chest port. Underinflation with developing opacity left lung base. Possible infiltrate versus atelectasis. Recommend follow-up Widened mediastinum with fullness in the aortopulmonary window. Please correlate for known history of abnormal lymph nodes Electronically Signed   By: Jill Side M.D.   On: 01/10/2023 18:26    ASSESSMENT & PLAN:   42 year old male with HIV/AIDS with newly diagnosed Burkitt's lymphoma   #1  Stage II recently diagnosed Burkitt's lymphoma Presenting with rapidly growing right neck mass extending into the chest, bilateral x-ray lymphadenopathy and also concern for possible involvement of retroperitoneal lymph nodes. Concerning for at least stage II possibly stage III disease. Some borderline lymphadenopathy in the abdomen could also be from his recently diagnosed HIV/AIDS. No clinical symptoms suggestive of CNS involvement at this time. Brain MRI negative.  No constitutional symptoms. No significant cytopenias to suggest bone marrow involvement. Discussed with patient and he is not interested in fertility preservation or sperm banking. First  presented in August at [facility] and was treated empirically with steroids and antibiotics with improvement in swelling prior to additional progression. -Currently on dose adjusted EPOCH-R. Status post 1 cycle. Tolerated well overall.  -Scheduled for cycle #2 on 12/10/22. Will help facilitate inpatient admission. -Outpatient follow up on 12/17/22 for Rituxan and Neulasta   #2 HIV/AIDS recently diagnosed 4 months ago. Following Dr. Elna Breslow from [place] and currently taking Biktarvy.   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  Patient reports this was completely treated.   #4 pancytopenia likely due to chemotherapy.  Accentuated by the patient's underlying HIV/AIDS, antiretroviral therapy and Bactrim .   #5 h/o severe neutropenia with fever= rsolved.  Likely urinary tract infection given patient's hematuria and dysuria though it is also possible that he may have passed a kidney stone.  PLAN: -Discussed lab results from today, 01/20/2023, with the patient. CBC shows decreased WBC of 2.4, decreased hemoglobin of 8.2, and hematocrit of 27.1. CMP is stable.  -Discussed PET scan results from 01/17/2023 which showed excellent response to treatment. The large markedly hypermetabolic right nodal neck mass extending into the thoracic inlet and upper right anterior mediastinum has largely resolved. Only a small area of residual disease in the subcutaneous fat overlying the right sternocleidomastoid muscle remains (Deauville 3). -  Discussed with the patient that if his hemoglobin drops below 7.5, we will need blood transfusion. Patient does not want blood transfusion unless it is absolutely necessary.  -Patient can proceed with his treatment as planned. Will keep the doses of daEPOCH-R the same as previous treatment...will need close lab monitoring as outpatient and potentially transfusion support. -infection precautions recommended. -UA/Ucx neg for UTI -no further OT MTX per patient  preference -outpatient Rituxan and G-CSF on 2/12 - B complex to keep with Bactrim induced cytopenias. -lovenox for VTE prophylaxis -Salt/bakind soda mouthwash -Miralaox + Senna-S for constipation prophylaxis  .The total time spent in the appointment was 75 minutes* .  All of the patient's questions were answered with apparent satisfaction. The patient knows to call the clinic with any problems, questions or concerns.   Sullivan Lone MD MS AAHIVMS [facility] Hematology/Oncology Physician [facility]  .*Total Encounter Time as defined by the Centers for Medicare and Medicaid Services includes, in addition to the face-to-face time of a patient visit (documented in the note above) non-face-to-face time: obtaining and reviewing outside history, ordering and reviewing medications, tests or procedures, care coordination (communications with other health care professionals or caregivers) and documentation in the medical record.   01/20/2023 10:28 AM    I,Param Shah,acting as a scribe for Dr. Sullivan Lone, MD. .I have reviewed the above documentation for accuracy and completeness, and I agree with the above. Sullivan Lone MD MS

## 2023-01-24 NOTE — Discharge Summary (Signed)
. [place]  Telephone:([phone]) [phone] Fax:(336) [phone]    Physician Discharge Summary     Patient ID: Logan Ward MRN: [id number] DOB/AGE: 1981/12/10 42 y.o.  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   B-complex with vitamin C tablet Take 1 tablet by mouth daily. Start taking on: January 25, 2023   Biktarvy 50-200-25 MG Tabs tablet Generic drug: bictegravir-emtricitabine-tenofovir AF Take 1 tablet by mouth daily.   dexamethasone 4 MG tablet Commonly known as: DECADRON Take 1 tablet (4 mg total) by mouth 2 (two) times daily with breakfast and lunch for 2 days after completion of chemotherapy   hydrocortisone 2.5 % rectal cream Commonly known as: ANUSOL-HC Place rectally as directed 3 times daily. What changed: how much to take   lidocaine-prilocaine cream Commonly known as: EMLA Apply to affected area once   magic mouthwash (nystatin, lidocaine, diphenhydrAMINE, alum & mag hydroxide) suspension Take 5 mLs by mouth 4 times daily as needed for mouth pain.   ondansetron 8 MG tablet Commonly known as: ZOFRAN Take 1 tablet (8 mg total) by mouth every 8 (eight) hours as needed for nausea.   polyethylene glycol 17 g packet Commonly known as: MIRALAX / GLYCOLAX Take 1 packet (17 g) by mouth daily.   prochlorperazine 10 MG tablet Commonly known as: COMPAZINE Take 1 tablet (10 mg total) by mouth every 6 (six) hours as needed.   senna-docusate 8.6-50 MG tablet Commonly known as: Senokot-S Take 2 tablets by mouth at bedtime as  needed for mild constipation.   sodium bicarbonate/sodium chloride Soln Use 1 Application by Mouth Rinse route as needed for dry mouth.   sulfamethoxazole-trimethoprim 800-160 MG tablet Commonly known as: BACTRIM DS Take 1 tablet by mouth daily.   VITAMIN C PO Take 1 tablet by mouth daily.         Disposition and Follow-up:   Significant Diagnostic Studies:  NM PET Image Restag (PS) Skull Base To Thigh  Result Date: 01/18/2023 CLINICAL DATA:  Subsequent treatment strategy for Burkitt's lymphoma. Status post 3 cycles of chemo immunotherapy. EXAM: NUCLEAR MEDICINE PET SKULL BASE TO THIGH TECHNIQUE: 13.68 mCi F-18 FDG was injected intravenously. Full-ring PET imaging was performed from the skull base to thigh after the radiotracer. CT data was obtained and used for attenuation correction and anatomic localization. Fasting blood glucose: 96 mg/dl COMPARISON:  PET-CT 11/19/2022 FINDINGS: Mediastinal blood pool activity: SUV max 2.06 Liver activity: SUV max 4.13 NECK: No residual hypermetabolic adenopathy. The large bulk E right neck mass extending into the right subclavicular area has near completely resolved. Residual subcutaneous disease anterior to the right sternocleidomastoid muscle is noted. This measures 2.5 x 2.4 cm and previously measured 5 x 5 cm. SUV max is 3.12 and was previously 33.13. Incidental CT findings: None. CHEST: The substernal/upper right anterior mediastinal disease has resolved. No residual measurable tumor or hypermetabolism. The right axillary adenopathy the has resolved. No residual measurable disease or hypermetabolism. Incidental CT findings: Left IJ power port in good position without complicating features. No enlarged mediastinal  or hilar lymph nodes. No worrisome pulmonary findings. ABDOMEN/PELVIS: No abnormal hypermetabolic activity within the liver, pancreas, adrenal glands, or spleen. No hypermetabolic lymph nodes in the abdomen or pelvis. Incidental CT findings:  Thick wall bladder with pericystic interstitial/inflammatory changes could suggest cystitis. Recommend clinical correlation. SKELETON: Diffuse marrow FDG uptake likely related to chemotherapy or marrow stimulating drugs. Incidental CT findings: None. IMPRESSION: 1. PET-CT findings suggest an excellent response to treatment ([place] 1). The large markedly hypermetabolic right nodal neck mass extending into the thoracic inlet and upper right anterior mediastinum has largely resolved. Only a small area of residual disease in the subcutaneous fat overlying the right sternocleidomastoid muscle remains (Deauville 3). 2. No disease below the diaphragm. 3. Mild diffuse marrow uptake likely due to chemotherapy or marrow stimulating drugs. 4. Thick wall bladder with pericystic interstitial/inflammatory changes could suggest cystitis. Electronically Signed   By: Marijo Sanes M.D.   On: 01/18/2023 09:00   DG Chest 2 View  Result Date: 01/10/2023 CLINICAL DATA:  Shortness of breath and fever EXAM: CHEST - 2 VIEW COMPARISON:  Chest x-ray and CT 11/10/2022. FINDINGS: Left IJ chest port with tip at the SVC right atrial junction. Underinflation. Developing mild opacity at the left lung base. Slight asymmetric vascular congestion at the left hilum. No pneumothorax or effusion. There is large mint of the mediastinum particularly along the aortopulmonary window. Please correlate for any known history including abnormal nodes as seen on CT scan of 11/10/2022. This is more prominent than the prior x-ray IMPRESSION: Chest port. Underinflation with developing opacity left lung base. Possible infiltrate versus atelectasis. Recommend follow-up Widened mediastinum with fullness in the aortopulmonary window. Please correlate for known history of abnormal lymph nodes Electronically Signed   By: Jill Side M.D.   On: 01/10/2023 18:26    Discharge Laboratory Values: .    Latest Ref Rng & Units 01/24/2023    5:25 AM 01/23/2023   12:45  PM 01/22/2023   10:43 AM  CBC  WBC 4.0 - 10.5 K/uL 1.5  2.7  3.2   Hemoglobin 13.0 - 17.0 g/dL 8.4  8.7  8.5   Hematocrit 39.0 - 52.0 % 26.0  27.3  27.8   Platelets 150 - 400 K/uL 275  253  279    .    Latest Ref Rng & Units 01/24/2023    5:25 AM 01/23/2023   12:45 PM 01/22/2023   10:43 AM  CMP  Glucose 70 - 99 mg/dL 103  92  168   BUN 6 - 20 mg/dL 14  15  14   $ Creatinine 0.61 - 1.24 mg/dL 0.89  0.83  0.94   Sodium 135 - 145 mmol/L 133  136  136   Potassium 3.5 - 5.1 mmol/L 3.9  3.4  3.7   Chloride 98 - 111 mmol/L 100  101  104   CO2 22 - 32 mmol/L 24  24  21   $ Calcium 8.9 - 10.3 mg/dL 8.7  8.6  8.8   Total Protein 6.5 - 8.1 g/dL 6.7  7.0  7.0   Total Bilirubin 0.3 - 1.2 mg/dL 1.4  1.4  1.4   Alkaline Phos 38 - 126 U/L 37  42  47   AST 15 - 41 U/L 19  18  24   $ ALT 0 - 44 U/L 15  16  16     $ Brief H and P: For complete details please refer to admission H and P, but in brief,  Logan Ward is a wonderful 42 y.o. male with a history of HIV/AIDS(CD count of 190 on 12/19/2022) on Biktarvy who is being admitted for cycle 4 of inpatient dose adjusted EPOCH-R chemotherapy.   Patient was recently admitted for neutropenic fever and was noted to have UTI. WBC counts bounced abck rapidly from G-CSF shot. Patient has since had a PET/CT which has shown excellent response to treatment of his Burkitts lymphoma.   He has had 2 doses of IT MTX for CNS prophylaxis but has declined further IT MTX.   He has been compliant with his HAART treatment, OI prophylaxis infection issues. Headaches have resolved.  No other acute new focal neurological symptoms. No fevers no chills no night sweats no new lumps or bumps. Good p.o. intake and hydration. Urinary symptoms resolved.   I discussed with the patient that if his hemoglobin decreases to 7.5 or below, we will need blood transfusion. Patient notes he does not want blood transfusion unless it is absolutely necessary.   Issues during hospitalization  #1   Stage II recently diagnosed Burkitt's lymphoma Presenting with rapidly growing right neck mass extending into the chest, bilateral x-ray lymphadenopathy and also concern for possible involvement of retroperitoneal lymph nodes. Concerning for at least stage II possibly stage III disease. Some borderline lymphadenopathy in the abdomen could also be from his recently diagnosed HIV/AIDS. No clinical symptoms suggestive of CNS involvement at this time. Brain MRI negative.  No constitutional symptoms. No significant cytopenias to suggest bone marrow involvement. Discussed with patient and he is not interested in fertility preservation or sperm banking. First presented in August at [facility] and was treated empirically with steroids and antibiotics with improvement in swelling prior to additional progression. -Currently on dose adjusted EPOCH-R. Status post 1 cycle. Tolerated well overall.  -Scheduled for cycle #2 on 12/10/22. Will help facilitate inpatient admission. -Outpatient follow up on 12/17/22 for Rituxan and Neulasta   #2 HIV/AIDS recently diagnosed 4 months ago. Following Dr. Elna Breslow from [place] and currently taking Biktarvy.   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  Patient reports this was completely treated.   #4 pancytopenia likely due to chemotherapy.  Accentuated by the patient's underlying HIV/AIDS, antiretroviral therapy and Bactrim .   #5 h/o severe neutropenia with fever= resolved.  Likely urinary tract infection given patient's hematuria and dysuria though it is also possible that he may have passed a kidney stone.   PLAN: -Discussed lab results from today, 01/23/2023 -patient tolerated his C4 of [facility] as inpatient well with no acute issues. Has some issues with grade 1 constipation and with anemia and neutropenia -no further IT MTX per patient preference -outpatient Rituxan and G-CSF on 2/12 - B complex to help with Bactrim induced cytopenias. -Salt/baking  soda mouthwash to reduce risk of mucositis and oral ulcers. -Miralax + Senna-S for constipation prophylaxis. -he was recommended to follow strict neutropenic precautions. -extended augmentin by 1 additional week to cover for any risk of infection fron neutropenic fevers. - he has been setup for twice weekly portflush, labs and monitoring for transfusion needs MD visit in following week.   Physical Exam at Discharge: BP 114/61 (BP Location: Left Arm)   Pulse 70   Temp 98.1 F (36.7 C) (Oral)   Resp 20   Ht 6' 1"$  (1.854 m)   Wt 271 lb (122.9 kg)   SpO2 100%   BMI 35.75 kg/m  . GENERAL:alert, in no acute distress and comfortable SKIN: no  acute rashes, no significant lesions EYES: conjunctiva are pink and non-injected, sclera anicteric OROPHARYNX: MMM, no exudates, no oropharyngeal erythema or ulceration NECK: supple, no JVD LYMPH:  no palpable lymphadenopathy in the cervical, axillary or inguinal regions LUNGS: clear to auscultation b/l with normal respiratory effort HEART: regular rate & rhythm ABDOMEN:  normoactive bowel sounds , non tender, not distended. 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 Sullivan Lone MD [place] [phone]  01/24/2023, 1:30 PM TT discharging patient>30 mins

## 2023-01-27 ENCOUNTER — Inpatient Hospital Stay: Payer: 59

## 2023-01-27 ENCOUNTER — Other Ambulatory Visit: Payer: Self-pay

## 2023-01-27 ENCOUNTER — Other Ambulatory Visit (HOSPITAL_COMMUNITY): Payer: Self-pay

## 2023-01-27 ENCOUNTER — Inpatient Hospital Stay (HOSPITAL_BASED_OUTPATIENT_CLINIC_OR_DEPARTMENT_OTHER): Payer: 59 | Admitting: Hematology

## 2023-01-27 VITALS — BP 110/64 | HR 87 | Temp 98.9°F | Resp 18

## 2023-01-27 DIAGNOSIS — R634 Abnormal weight loss: Secondary | ICD-10-CM | POA: Diagnosis not present

## 2023-01-27 DIAGNOSIS — D696 Thrombocytopenia, unspecified: Secondary | ICD-10-CM | POA: Diagnosis not present

## 2023-01-27 DIAGNOSIS — C8378 Burkitt lymphoma, lymph nodes of multiple sites: Secondary | ICD-10-CM

## 2023-01-27 DIAGNOSIS — Z79624 Long term (current) use of inhibitors of nucleotide synthesis: Secondary | ICD-10-CM | POA: Diagnosis not present

## 2023-01-27 DIAGNOSIS — Z5112 Encounter for antineoplastic immunotherapy: Secondary | ICD-10-CM | POA: Diagnosis present

## 2023-01-27 DIAGNOSIS — E883 Tumor lysis syndrome: Secondary | ICD-10-CM | POA: Diagnosis not present

## 2023-01-27 DIAGNOSIS — Z79899 Other long term (current) drug therapy: Secondary | ICD-10-CM | POA: Diagnosis not present

## 2023-01-27 DIAGNOSIS — Z7189 Other specified counseling: Secondary | ICD-10-CM | POA: Diagnosis not present

## 2023-01-27 DIAGNOSIS — C8370 Burkitt lymphoma, unspecified site: Secondary | ICD-10-CM | POA: Diagnosis present

## 2023-01-27 DIAGNOSIS — B20 Human immunodeficiency virus [HIV] disease: Secondary | ICD-10-CM | POA: Diagnosis present

## 2023-01-27 DIAGNOSIS — Z5189 Encounter for other specified aftercare: Secondary | ICD-10-CM | POA: Diagnosis not present

## 2023-01-27 LAB — CBC WITH DIFFERENTIAL (CANCER CENTER ONLY)
Abs Immature Granulocytes: 0 10*3/uL (ref 0.00–0.07)
Basophils Absolute: 0 10*3/uL (ref 0.0–0.1)
Basophils Relative: 1 %
Eosinophils Absolute: 0 10*3/uL (ref 0.0–0.5)
Eosinophils Relative: 1 %
HCT: 24.3 % — ABNORMAL LOW (ref 39.0–52.0)
Hemoglobin: 8 g/dL — ABNORMAL LOW (ref 13.0–17.0)
Immature Granulocytes: 0 %
Lymphocytes Relative: 28 %
Lymphs Abs: 0.3 10*3/uL — ABNORMAL LOW (ref 0.7–4.0)
MCH: 28 pg (ref 26.0–34.0)
MCHC: 32.9 g/dL (ref 30.0–36.0)
MCV: 85 fL (ref 80.0–100.0)
Monocytes Absolute: 0 10*3/uL — ABNORMAL LOW (ref 0.1–1.0)
Monocytes Relative: 1 %
Neutro Abs: 0.7 10*3/uL — ABNORMAL LOW (ref 1.7–7.7)
Neutrophils Relative %: 69 %
Platelet Count: 216 10*3/uL (ref 150–400)
RBC: 2.86 MIL/uL — ABNORMAL LOW (ref 4.22–5.81)
RDW: 17.4 % — ABNORMAL HIGH (ref 11.5–15.5)
Smear Review: NORMAL
WBC Count: 1 10*3/uL — ABNORMAL LOW (ref 4.0–10.5)
nRBC: 0 % (ref 0.0–0.2)

## 2023-01-27 LAB — SAMPLE TO BLOOD BANK

## 2023-01-27 LAB — CMP (CANCER CENTER ONLY)
ALT: 16 U/L (ref 0–44)
AST: 11 U/L — ABNORMAL LOW (ref 15–41)
Albumin: 4 g/dL (ref 3.5–5.0)
Alkaline Phosphatase: 48 U/L (ref 38–126)
Anion gap: 6 (ref 5–15)
BUN: 13 mg/dL (ref 6–20)
CO2: 27 mmol/L (ref 22–32)
Calcium: 9 mg/dL (ref 8.9–10.3)
Chloride: 103 mmol/L (ref 98–111)
Creatinine: 0.82 mg/dL (ref 0.61–1.24)
GFR, Estimated: >60 mL/min (ref >60)
Glucose, Bld: 145 mg/dL — ABNORMAL HIGH (ref 70–99)
Potassium: 3.3 mmol/L — ABNORMAL LOW (ref 3.5–5.1)
Sodium: 136 mmol/L (ref 135–145)
Total Bilirubin: 1.1 mg/dL (ref 0.3–1.2)
Total Protein: 6.4 g/dL — ABNORMAL LOW (ref 6.5–8.1)

## 2023-01-27 MED ORDER — ACETAMINOPHEN 325 MG PO TABS
650.0000 mg | ORAL_TABLET | Freq: Once | ORAL | Status: AC
  Administered 2023-01-27: 650 mg via ORAL
  Filled 2023-01-27: qty 2

## 2023-01-27 MED ORDER — HEPARIN SOD (PORK) LOCK FLUSH 100 UNIT/ML IV SOLN
500.0000 [IU] | Freq: Once | INTRAVENOUS | Status: AC | PRN
  Administered 2023-01-27: 500 [IU]

## 2023-01-27 MED ORDER — SODIUM CHLORIDE 0.9 % IV SOLN: Freq: Once | INTRAVENOUS | Status: AC

## 2023-01-27 MED ORDER — SODIUM CHLORIDE 0.9 % IV SOLN
375.0000 mg/m2 | Freq: Once | INTRAVENOUS | Status: AC
  Administered 2023-01-27: 1000 mg via INTRAVENOUS
  Filled 2023-01-27: qty 100

## 2023-01-27 MED ORDER — DIPHENHYDRAMINE HCL 25 MG PO CAPS
50.0000 mg | ORAL_CAPSULE | Freq: Once | ORAL | Status: AC
  Administered 2023-01-27: 50 mg via ORAL
  Filled 2023-01-27: qty 2

## 2023-01-27 MED ORDER — SODIUM CHLORIDE 0.9% FLUSH
10.0000 mL | Freq: Once | INTRAVENOUS | Status: AC
  Administered 2023-01-27: 10 mL

## 2023-01-27 MED ORDER — PEGFILGRASTIM-CBQV 6 MG/0.6ML ~~LOC~~ SOSY
6.0000 mg | PREFILLED_SYRINGE | Freq: Once | SUBCUTANEOUS | Status: AC
  Administered 2023-01-27: 6 mg via SUBCUTANEOUS
  Filled 2023-01-27: qty 0.6

## 2023-01-27 MED ORDER — FAMOTIDINE IN NACL 20-0.9 MG/50ML-% IV SOLN
20.0000 mg | Freq: Once | INTRAVENOUS | Status: AC
  Administered 2023-01-27: 20 mg via INTRAVENOUS
  Filled 2023-01-27: qty 50

## 2023-01-27 MED ORDER — AMOXICILLIN-POT CLAVULANATE 875-125 MG PO TABS
1.0000 | ORAL_TABLET | Freq: Two times a day (BID) | ORAL | 0 refills | Status: DC
  Filled 2023-01-27: qty 20, 10d supply, fill #0

## 2023-01-27 MED ORDER — SODIUM CHLORIDE 0.9% FLUSH
10.0000 mL | INTRAVENOUS | Status: DC | PRN
  Administered 2023-01-27: 10 mL

## 2023-01-27 MED ORDER — METHYLPREDNISOLONE SODIUM SUCC 125 MG IJ SOLR
125.0000 mg | Freq: Every day | INTRAMUSCULAR | Status: DC
  Administered 2023-01-27: 125 mg via INTRAVENOUS
  Filled 2023-01-27: qty 2
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
## 2023-01-27 NOTE — Progress Notes (Signed)
Patient seen by MD today  Vitals are within treatment parameters./ aware HR is elevated  Labs reviewed: ,"and are not all within treatment parameters. Not all labs have resulted /pt okay for tx with low ANC (expected ANC to be low by MD)   Aware HGB 8.0  Per physician team, patient is ready for treatment and there are NO modifications to the treatment plan.

## 2023-01-27 NOTE — Patient Instructions (Addendum)
[facility]  Discharge Instructions: Thank you for choosing [facility] to provide your oncology and hematology care.   If you have a lab appointment with the [facility], please go directly to the [facility] and check in at the registration area.   Wear comfortable clothing and clothing appropriate for easy access to any Portacath or PICC line.   We strive to give you quality time with your provider. You may need to reschedule your appointment if you arrive late (15 or more minutes).  Arriving late affects you and other patients whose appointments are after yours.  Also, if you miss three or more appointments without notifying the office, you may be dismissed from the clinic at the provider's discretion.      For prescription refill requests, have your pharmacy contact our office and allow 72 hours for refills to be completed.    Today you received the following chemotherapy and/or immunotherapy agents: Rituximab, Udenyca   To help prevent nausea and vomiting after your treatment, we encourage you to take your nausea medication as directed.  BELOW ARE SYMPTOMS THAT SHOULD BE REPORTED IMMEDIATELY: *FEVER GREATER THAN 100.4 F (38 C) OR HIGHER *CHILLS OR SWEATING *NAUSEA AND VOMITING THAT IS NOT CONTROLLED WITH YOUR NAUSEA MEDICATION *UNUSUAL SHORTNESS OF BREATH *UNUSUAL BRUISING OR BLEEDING *URINARY PROBLEMS (pain or burning when urinating, or frequent urination) *BOWEL PROBLEMS (unusual diarrhea, constipation, pain near the anus) TENDERNESS IN MOUTH AND THROAT WITH OR WITHOUT PRESENCE OF ULCERS (sore throat, sores in mouth, or a toothache) UNUSUAL RASH, SWELLING OR PAIN  UNUSUAL VAGINAL DISCHARGE OR ITCHING   Items with * indicate a potential emergency and should be followed up as soon as possible or go to the Emergency Department if any problems should occur.  Please show the CHEMOTHERAPY ALERT CARD or IMMUNOTHERAPY ALERT CARD at  check-in to the Emergency Department and triage nurse.  Should you have questions after your visit or need to cancel or reschedule your appointment, please contact [facility]  Dept: [phone]  and follow the prompts.  Office hours are 8:00 a.m. to 4:30 p.m. Monday - Friday. Please note that voicemails left after 4:00 p.m. may not be returned until the following business day.  We are closed weekends and major holidays. You have access to a nurse at all times for urgent questions. Please call the main number to the clinic Dept: [phone] and follow the prompts.   For any non-urgent questions, you may also contact your provider using MyChart. We now offer e-Visits for anyone 43 and older to request care online for non-urgent symptoms. For details visit [email or web address].   Also download the MyChart app! Go to the app store, search "MyChart", open the app, select [facility], and log in with your MyChart username and password.

## 2023-01-28 ENCOUNTER — Inpatient Hospital Stay: Payer: 59 | Admitting: Licensed Clinical Social Worker

## 2023-01-28 ENCOUNTER — Telehealth: Payer: Self-pay

## 2023-01-28 ENCOUNTER — Other Ambulatory Visit (HOSPITAL_COMMUNITY): Payer: Self-pay

## 2023-01-28 NOTE — Telephone Encounter (Signed)
CSW returned patient's call.  Left vm.

## 2023-01-28 NOTE — Progress Notes (Signed)
[facility] CSW Progress Note  Clinical Education officer, museum returned patient's call to assess needs.  He inquired about additional financial assistance to help pay for his rent.  He has used his Advertising account executive.  CSW to provide the remainder two [place] to patient on Thursday.  CSW to follow up with the Leukemia and Lymphoma grant that was submitted for patient.  Gave him the name and contact information for the [employer] for financial assistance.  Patient expressed no other needs.    Logan Pickle [place], LCSW

## 2023-01-30 ENCOUNTER — Inpatient Hospital Stay: Payer: 59

## 2023-01-30 ENCOUNTER — Other Ambulatory Visit: Payer: Self-pay

## 2023-01-31 ENCOUNTER — Other Ambulatory Visit: Payer: Self-pay

## 2023-01-31 ENCOUNTER — Emergency Department (HOSPITAL_COMMUNITY): Payer: 59

## 2023-01-31 ENCOUNTER — Encounter: Payer: Self-pay | Admitting: Hematology

## 2023-01-31 ENCOUNTER — Inpatient Hospital Stay (HOSPITAL_COMMUNITY)
Admission: EM | Admit: 2023-01-31 | Discharge: 2023-02-03 | DRG: 974 | Disposition: A | Discharge status: Home / Self Care | Payer: 59 | Source: Ambulatory Visit | Attending: Internal Medicine | Admitting: Internal Medicine

## 2023-01-31 ENCOUNTER — Telehealth: Payer: Self-pay | Admitting: Medical Oncology

## 2023-01-31 ENCOUNTER — Inpatient Hospital Stay: Payer: 59

## 2023-01-31 ENCOUNTER — Encounter (HOSPITAL_COMMUNITY): Payer: Self-pay | Admitting: Emergency Medicine

## 2023-01-31 VITALS — Temp 103.1°F

## 2023-01-31 DIAGNOSIS — T451X5A Adverse effect of antineoplastic and immunosuppressive drugs, initial encounter: Secondary | ICD-10-CM | POA: Diagnosis present

## 2023-01-31 DIAGNOSIS — E871 Hypo-osmolality and hyponatremia: Secondary | ICD-10-CM

## 2023-01-31 DIAGNOSIS — D539 Nutritional anemia, unspecified: Secondary | ICD-10-CM | POA: Diagnosis present

## 2023-01-31 DIAGNOSIS — D61810 Antineoplastic chemotherapy induced pancytopenia: Secondary | ICD-10-CM | POA: Diagnosis present

## 2023-01-31 DIAGNOSIS — B20 Human immunodeficiency virus [HIV] disease: Secondary | ICD-10-CM | POA: Diagnosis present

## 2023-01-31 DIAGNOSIS — Z1152 Encounter for screening for COVID-19: Secondary | ICD-10-CM

## 2023-01-31 DIAGNOSIS — D649 Anemia, unspecified: Secondary | ICD-10-CM | POA: Diagnosis not present

## 2023-01-31 DIAGNOSIS — R591 Generalized enlarged lymph nodes: Secondary | ICD-10-CM | POA: Diagnosis present

## 2023-01-31 DIAGNOSIS — K626 Ulcer of anus and rectum: Secondary | ICD-10-CM | POA: Diagnosis present

## 2023-01-31 DIAGNOSIS — K648 Other hemorrhoids: Secondary | ICD-10-CM | POA: Diagnosis present

## 2023-01-31 DIAGNOSIS — C8370 Burkitt lymphoma, unspecified site: Secondary | ICD-10-CM | POA: Diagnosis present

## 2023-01-31 DIAGNOSIS — Z8249 Family history of ischemic heart disease and other diseases of the circulatory system: Secondary | ICD-10-CM | POA: Diagnosis not present

## 2023-01-31 DIAGNOSIS — Z6836 Body mass index (BMI) 36.0-36.9, adult: Secondary | ICD-10-CM | POA: Diagnosis not present

## 2023-01-31 DIAGNOSIS — Z808 Family history of malignant neoplasm of other organs or systems: Secondary | ICD-10-CM | POA: Diagnosis not present

## 2023-01-31 DIAGNOSIS — D709 Neutropenia, unspecified: Secondary | ICD-10-CM

## 2023-01-31 DIAGNOSIS — D638 Anemia in other chronic diseases classified elsewhere: Secondary | ICD-10-CM | POA: Diagnosis present

## 2023-01-31 DIAGNOSIS — D6959 Other secondary thrombocytopenia: Secondary | ICD-10-CM | POA: Diagnosis present

## 2023-01-31 DIAGNOSIS — E669 Obesity, unspecified: Secondary | ICD-10-CM | POA: Diagnosis present

## 2023-01-31 DIAGNOSIS — R5081 Fever presenting with conditions classified elsewhere: Principal | ICD-10-CM

## 2023-01-31 DIAGNOSIS — Z8619 Personal history of other infectious and parasitic diseases: Secondary | ICD-10-CM | POA: Diagnosis not present

## 2023-01-31 DIAGNOSIS — D696 Thrombocytopenia, unspecified: Secondary | ICD-10-CM

## 2023-01-31 DIAGNOSIS — T451X5S Adverse effect of antineoplastic and immunosuppressive drugs, sequela: Secondary | ICD-10-CM

## 2023-01-31 DIAGNOSIS — E876 Hypokalemia: Secondary | ICD-10-CM | POA: Diagnosis present

## 2023-01-31 DIAGNOSIS — D701 Agranulocytosis secondary to cancer chemotherapy: Secondary | ICD-10-CM | POA: Diagnosis present

## 2023-01-31 DIAGNOSIS — R590 Localized enlarged lymph nodes: Secondary | ICD-10-CM | POA: Diagnosis present

## 2023-01-31 DIAGNOSIS — C8378 Burkitt lymphoma, lymph nodes of multiple sites: Secondary | ICD-10-CM

## 2023-01-31 DIAGNOSIS — D61818 Other pancytopenia: Secondary | ICD-10-CM | POA: Diagnosis present

## 2023-01-31 LAB — URINALYSIS, ROUTINE W REFLEX MICROSCOPIC
Bilirubin Urine: NEGATIVE
Glucose, UA: NEGATIVE mg/dL
Hgb urine dipstick: NEGATIVE
Ketones, ur: NEGATIVE mg/dL
Leukocytes,Ua: NEGATIVE
Nitrite: NEGATIVE
Protein, ur: NEGATIVE mg/dL
Specific Gravity, Urine: 1.013 (ref 1.005–1.030)
pH: 7 (ref 5.0–8.0)

## 2023-01-31 LAB — CMP (CANCER CENTER ONLY)
ALT: 17 U/L (ref 0–44)
AST: 8 U/L — ABNORMAL LOW (ref 15–41)
Albumin: 4.1 g/dL (ref 3.5–5.0)
Alkaline Phosphatase: 49 U/L (ref 38–126)
Anion gap: 8 (ref 5–15)
BUN: 8 mg/dL (ref 6–20)
CO2: 27 mmol/L (ref 22–32)
Calcium: 9.3 mg/dL (ref 8.9–10.3)
Chloride: 99 mmol/L (ref 98–111)
Creatinine: 0.77 mg/dL (ref 0.61–1.24)
GFR, Estimated: >60 mL/min
Glucose, Bld: 105 mg/dL — ABNORMAL HIGH (ref 70–99)
Potassium: 4.1 mmol/L (ref 3.5–5.1)
Sodium: 134 mmol/L — ABNORMAL LOW (ref 135–145)
Total Bilirubin: 0.9 mg/dL (ref 0.3–1.2)
Total Protein: 6.7 g/dL (ref 6.5–8.1)

## 2023-01-31 LAB — PROTIME-INR
INR: 1.2 (ref 0.8–1.2)
Prothrombin Time: 14.7 seconds (ref 11.4–15.2)

## 2023-01-31 LAB — COMPREHENSIVE METABOLIC PANEL
ALT: 20 U/L (ref 0–44)
AST: 15 U/L (ref 15–41)
Albumin: 3.8 g/dL (ref 3.5–5.0)
Alkaline Phosphatase: 43 U/L (ref 38–126)
Anion gap: 8 (ref 5–15)
BUN: 10 mg/dL (ref 6–20)
CO2: 23 mmol/L (ref 22–32)
Calcium: 8.5 mg/dL — ABNORMAL LOW (ref 8.9–10.3)
Chloride: 96 mmol/L — ABNORMAL LOW (ref 98–111)
Creatinine, Ser: 0.81 mg/dL (ref 0.61–1.24)
GFR, Estimated: >60 mL/min (ref >60)
Glucose, Bld: 118 mg/dL — ABNORMAL HIGH (ref 70–99)
Potassium: 4 mmol/L (ref 3.5–5.1)
Sodium: 127 mmol/L — ABNORMAL LOW (ref 135–145)
Total Bilirubin: 1.1 mg/dL (ref 0.3–1.2)
Total Protein: 6.7 g/dL (ref 6.5–8.1)

## 2023-01-31 LAB — CBC WITH DIFFERENTIAL/PLATELET
Abs Immature Granulocytes: 0.01 10*3/uL (ref 0.00–0.07)
Basophils Absolute: 0 10*3/uL (ref 0.0–0.1)
Basophils Relative: 0 %
Eosinophils Absolute: 0 10*3/uL (ref 0.0–0.5)
Eosinophils Relative: 0 %
HCT: 22 % — ABNORMAL LOW (ref 39.0–52.0)
Hemoglobin: 7 g/dL — ABNORMAL LOW (ref 13.0–17.0)
Immature Granulocytes: 2 %
Lymphocytes Relative: 65 %
Lymphs Abs: 0.3 10*3/uL — ABNORMAL LOW (ref 0.7–4.0)
MCH: 27.9 pg (ref 26.0–34.0)
MCHC: 31.8 g/dL (ref 30.0–36.0)
MCV: 87.6 fL (ref 80.0–100.0)
Monocytes Absolute: 0.1 10*3/uL (ref 0.1–1.0)
Monocytes Relative: 31 %
Neutro Abs: 0 10*3/uL — CL (ref 1.7–7.7)
Neutrophils Relative %: 2 %
Platelets: 44 10*3/uL — ABNORMAL LOW (ref 150–400)
RBC: 2.51 MIL/uL — ABNORMAL LOW (ref 4.22–5.81)
RDW: 17.2 % — ABNORMAL HIGH (ref 11.5–15.5)
WBC: 0.4 10*3/uL — CL (ref 4.0–10.5)
nRBC: 0 % (ref 0.0–0.2)

## 2023-01-31 LAB — BPAM RBC
Blood Product Expiration Date: 202403102359
Unit Type and Rh: 6200

## 2023-01-31 LAB — TYPE AND SCREEN
ABO/RH(D): AB POS
Antibody Screen: NEGATIVE
Unit division: 0

## 2023-01-31 LAB — RESP PANEL BY RT-PCR (RSV, FLU A&B, COVID)  RVPGX2
Influenza A by PCR: NEGATIVE
Influenza B by PCR: NEGATIVE
Resp Syncytial Virus by PCR: NEGATIVE
SARS Coronavirus 2 by RT PCR: NEGATIVE

## 2023-01-31 LAB — CBC WITH DIFFERENTIAL (CANCER CENTER ONLY)
Abs Immature Granulocytes: 0 10*3/uL (ref 0.00–0.07)
Basophils Absolute: 0 10*3/uL (ref 0.0–0.1)
Basophils Relative: 0 %
Eosinophils Absolute: 0 10*3/uL (ref 0.0–0.5)
Eosinophils Relative: 0 %
HCT: 23.2 % — ABNORMAL LOW (ref 39.0–52.0)
Hemoglobin: 7.6 g/dL — ABNORMAL LOW (ref 13.0–17.0)
Immature Granulocytes: 0 %
Lymphocytes Relative: 76 %
Lymphs Abs: 0.4 10*3/uL — ABNORMAL LOW (ref 0.7–4.0)
MCH: 27.7 pg (ref 26.0–34.0)
MCHC: 32.8 g/dL (ref 30.0–36.0)
MCV: 84.7 fL (ref 80.0–100.0)
Monocytes Absolute: 0.1 10*3/uL (ref 0.1–1.0)
Monocytes Relative: 20 %
Neutro Abs: 0 10*3/uL — CL (ref 1.7–7.7)
Neutrophils Relative %: 4 %
Platelet Count: 53 10*3/uL — ABNORMAL LOW (ref 150–400)
RBC: 2.74 MIL/uL — ABNORMAL LOW (ref 4.22–5.81)
RDW: 17 % — ABNORMAL HIGH (ref 11.5–15.5)
Smear Review: NORMAL
WBC Count: 0.5 10*3/uL — CL (ref 4.0–10.5)
nRBC: 0 % (ref 0.0–0.2)

## 2023-01-31 LAB — PREPARE RBC (CROSSMATCH)

## 2023-01-31 LAB — SAMPLE TO BLOOD BANK

## 2023-01-31 LAB — LACTIC ACID, PLASMA: Lactic Acid, Venous: 1.7 mmol/L (ref 0.5–1.9)

## 2023-01-31 LAB — GROUP A STREP BY PCR: Group A Strep by PCR: NOT DETECTED

## 2023-01-31 MED ORDER — BICTEGRAVIR-EMTRICITAB-TENOFOV 50-200-25 MG PO TABS
1.0000 | ORAL_TABLET | Freq: Every day | ORAL | Status: DC
  Administered 2023-02-01 – 2023-02-03 (×3): 1 via ORAL
  Filled 2023-01-31 (×4): qty 1

## 2023-01-31 MED ORDER — ONDANSETRON HCL 4 MG/2ML IJ SOLN
4.0000 mg | Freq: Four times a day (QID) | INTRAMUSCULAR | Status: DC | PRN
  Administered 2023-02-03: 4 mg via INTRAVENOUS
  Filled 2023-01-31: qty 2

## 2023-01-31 MED ORDER — TRAZODONE HCL 50 MG PO TABS
25.0000 mg | ORAL_TABLET | Freq: Every evening | ORAL | Status: DC | PRN
  Administered 2023-02-01: 25 mg via ORAL
  Filled 2023-01-31: qty 1

## 2023-01-31 MED ORDER — METHYLPREDNISOLONE SODIUM SUCC 40 MG IJ SOLR
40.0000 mg | Freq: Once | INTRAMUSCULAR | Status: AC | PRN
  Administered 2023-02-01: 40 mg via INTRAVENOUS
  Filled 2023-01-31: qty 1

## 2023-01-31 MED ORDER — ONDANSETRON HCL 4 MG PO TABS
4.0000 mg | ORAL_TABLET | Freq: Four times a day (QID) | ORAL | Status: DC | PRN
  Administered 2023-02-02: 4 mg via ORAL
  Filled 2023-01-31: qty 1

## 2023-01-31 MED ORDER — DOCUSATE SODIUM 100 MG PO CAPS
100.0000 mg | ORAL_CAPSULE | Freq: Two times a day (BID) | ORAL | Status: DC
  Administered 2023-02-01 – 2023-02-03 (×3): 100 mg via ORAL
  Filled 2023-01-31 (×4): qty 1

## 2023-01-31 MED ORDER — ACETAMINOPHEN 325 MG PO TABS
650.0000 mg | ORAL_TABLET | Freq: Once | ORAL | Status: AC | PRN
  Administered 2023-01-31: 650 mg via ORAL

## 2023-01-31 MED ORDER — ACETAMINOPHEN 500 MG PO TABS
1000.0000 mg | ORAL_TABLET | Freq: Once | ORAL | Status: AC
  Administered 2023-01-31: 1000 mg via ORAL
  Filled 2023-01-31: qty 2

## 2023-01-31 MED ORDER — LACTATED RINGERS IV BOLUS
1000.0000 mL | Freq: Once | INTRAVENOUS | Status: AC
  Administered 2023-01-31: 1000 mL via INTRAVENOUS

## 2023-01-31 MED ORDER — SENNOSIDES-DOCUSATE SODIUM 8.6-50 MG PO TABS: 1.0000 | ORAL_TABLET | Freq: Every evening | ORAL | Status: DC | PRN

## 2023-01-31 MED ORDER — SODIUM CHLORIDE 0.9% FLUSH
10.0000 mL | Freq: Once | INTRAVENOUS | Status: AC
  Administered 2023-01-31: 10 mL

## 2023-01-31 MED ORDER — SODIUM CHLORIDE 0.9 % IV SOLN
2.0000 g | Freq: Three times a day (TID) | INTRAVENOUS | Status: DC
  Administered 2023-02-01 – 2023-02-03 (×8): 2 g via INTRAVENOUS
  Filled 2023-01-31 (×9): qty 12.5

## 2023-01-31 MED ORDER — SODIUM CHLORIDE 0.9 % IV SOLN
2.0000 g | Freq: Once | INTRAVENOUS | Status: AC
  Administered 2023-01-31: 2 g via INTRAVENOUS
  Filled 2023-01-31: qty 12.5

## 2023-01-31 MED ORDER — SODIUM CHLORIDE 0.9% IV SOLUTION: Freq: Once | INTRAVENOUS | Status: DC

## 2023-01-31 MED ORDER — ACETAMINOPHEN 325 MG PO TABS
650.0000 mg | ORAL_TABLET | Freq: Once | ORAL | Status: AC | PRN
  Administered 2023-02-01: 650 mg via ORAL
  Filled 2023-01-31: qty 2

## 2023-01-31 MED ORDER — HEPARIN SOD (PORK) LOCK FLUSH 100 UNIT/ML IV SOLN
500.0000 [IU] | Freq: Once | INTRAVENOUS | Status: AC
  Administered 2023-01-31: 500 [IU]

## 2023-01-31 MED ORDER — HYDROCORTISONE (PERIANAL) 2.5 % EX CREA: 1.0000 | TOPICAL_CREAM | Freq: Three times a day (TID) | CUTANEOUS | Status: DC

## 2023-01-31 MED ORDER — ACETAMINOPHEN 325 MG PO TABS
650.0000 mg | ORAL_TABLET | Freq: Four times a day (QID) | ORAL | Status: DC | PRN
  Administered 2023-02-02: 650 mg via ORAL
  Filled 2023-01-31 (×2): qty 2

## 2023-01-31 MED ORDER — METHYLPREDNISOLONE SODIUM SUCC 40 MG IJ SOLR
40.0000 mg | Freq: Once | INTRAMUSCULAR | Status: AC | PRN
  Administered 2023-01-31: 40 mg via INTRAVENOUS
  Filled 2023-01-31: qty 1

## 2023-01-31 MED ORDER — ACETAMINOPHEN 650 MG RE SUPP: 650.0000 mg | Freq: Four times a day (QID) | RECTAL | Status: DC | PRN

## 2023-01-31 MED ORDER — SODIUM CHLORIDE 0.9 % IV SOLN: INTRAVENOUS | Status: DC

## 2023-01-31 MED ORDER — SODIUM BICARBONATE/SODIUM CHLORIDE MOUTHWASH
Freq: Four times a day (QID) | OROMUCOSAL | Status: DC
  Administered 2023-02-03: 1 via OROMUCOSAL
  Filled 2023-01-31: qty 1000

## 2023-01-31 NOTE — ED Provider Notes (Addendum)
[place] EMERGENCY DEPARTMENT AT [facility] Provider Note   CSN: [id number] Arrival date & time: 01/31/23  1421     History  Chief Complaint  Patient presents with   Fever    Logan Ward is a 42 y.o. male.  Patient c/o fever. Symptoms acute onset today, 103. Mild sore throat. No trouble breathing or swallowing. Denies cough, runny nose or sinus pain. No headache. Has port left chest, no pain, redness or swelling to area. Denies chest pain or discomfort. No sob. No abd pain or nvd. No dysuria or gu c/o. No extremity pain or swelling. No rash/skin lesions. No known ill contacts. Has hx Burkitts lymphoma/HIV, and reports last chemo 4 days ago. Had labs at cancer center, hgb 7, wbc 5, anc 0.   The history is provided by the patient, medical records and a relative. The history is limited by the condition of the patient.  Fever Associated symptoms: no chest pain, no confusion, no cough, no diarrhea, no dysuria, no headaches, no rash and no vomiting        Home Medications Prior to Admission medications   Medication Sig Start Date End Date Taking? Authorizing Provider  amoxicillin-clavulanate (AUGMENTIN) 875-125 MG tablet Take 1 tablet by mouth every 12 (twelve) hours for 10 days. 01/27/23 02/06/23  Brunetta Genera, MD  Ascorbic Acid (VITAMIN C PO) Take 1 tablet by mouth daily.    [provider]  B Complex-C (B-COMPLEX WITH VITAMIN C) tablet Take 1 tablet by mouth daily. 01/25/23   Brunetta Genera, MD  bictegravir-emtricitabine-tenofovir AF (BIKTARVY) 50-200-25 MG TABS tablet Take 1 tablet by mouth daily. 12/19/22   Golden Circle, FNP  dexamethasone (DECADRON) 4 MG tablet Take 1 tablet (4 mg total) by mouth 2 (two) times daily with breakfast and lunch for 2 days after completion of chemotherapy 12/14/22   Brunetta Genera, MD  hydrocortisone (ANUSOL-HC) 2.5 % rectal cream Place rectally as directed 3 times daily. Patient taking differently: Place 1  Application rectally 3 (three) times daily. 01/13/23   Shelly Coss, MD  lidocaine-prilocaine (EMLA) cream Apply to affected area once 11/18/22   Brunetta Genera, MD  magic mouthwash (nystatin, lidocaine, diphenhydrAMINE, alum & mag hydroxide) suspension Take 5 mLs by mouth 4 times daily as needed for mouth pain. 01/24/23 01/31/23  Brunetta Genera, MD  ondansetron (ZOFRAN) 8 MG tablet Take 1 tablet (8 mg total) by mouth every 8 (eight) hours as needed for nausea. 11/22/22   Brunetta Genera, MD  polyethylene glycol (MIRALAX / GLYCOLAX) 17 g packet Take 1 packet (17 g) by mouth daily. 11/23/22   Brunetta Genera, MD  prochlorperazine (COMPAZINE) 10 MG tablet Take 1 tablet (10 mg total) by mouth every 6 (six) hours as needed. 12/02/22   Heilingoetter, Cassandra L, PA-C  senna-docusate (SENOKOT-S) 8.6-50 MG tablet Take 2 tablets by mouth at bedtime as needed for mild constipation. 12/14/22   Brunetta Genera, MD  Sodium Chloride-Sodium Bicarb (SODIUM BICARBONATE/SODIUM CHLORIDE) SOLN Use 1 Application by Mouth Rinse route as needed for dry mouth. 01/13/23   Shelly Coss, MD  sulfamethoxazole-trimethoprim (BACTRIM DS) 800-160 MG tablet Take 1 tablet by mouth daily. 12/19/22   Golden Circle, FNP      Allergies    Patient has no known allergies.    Review of Systems   Review of Systems  Constitutional:  Positive for fever.  HENT:  Negative for sinus pain.   Eyes:  Negative for  pain, redness and visual disturbance.  Respiratory:  Negative for cough and shortness of breath.   Cardiovascular:  Negative for chest pain and leg swelling.  Gastrointestinal:  Negative for abdominal pain, diarrhea and vomiting.  Genitourinary:  Negative for dysuria and flank pain.  Musculoskeletal:  Negative for back pain, neck pain and neck stiffness.  Skin:  Negative for rash.  Neurological:  Negative for headaches.  Hematological:  Does not bruise/bleed easily.  Psychiatric/Behavioral:  Negative  for confusion.     Physical Exam Updated Vital Signs BP 122/80   Pulse (!) 111   Temp (!) 100.8 F (38.2 C) (Oral)   Resp 19   Ht 1.854 m (6' 1"$ )   Wt 125 kg   SpO2 96%   BMI 36.36 kg/m  Physical Exam Vitals and nursing note reviewed.  Constitutional:      Appearance: Normal appearance. He is well-developed.  HENT:     Head: Atraumatic.     Nose: Nose normal.     Mouth/Throat:     Mouth: Mucous membranes are moist.     Pharynx: Oropharynx is clear. No oropharyngeal exudate or posterior oropharyngeal erythema.  Eyes:     General: No scleral icterus.    Conjunctiva/sclera: Conjunctivae normal.     Pupils: Pupils are equal, round, and reactive to light.  Neck:     Trachea: No tracheal deviation.     Comments: No stiffness or rigidity Cardiovascular:     Rate and Rhythm: Normal rate and regular rhythm.     Pulses: Normal pulses.     Heart sounds: Normal heart sounds. No murmur heard.    No friction rub. No gallop.  Pulmonary:     Effort: Pulmonary effort is normal. No accessory muscle usage or respiratory distress.     Breath sounds: Normal breath sounds.     Comments: Port left chest without sign of infection Abdominal:     General: Bowel sounds are normal. There is no distension.     Palpations: Abdomen is soft.     Tenderness: There is no abdominal tenderness. There is no guarding.  Genitourinary:    Comments: No cva tenderness. Musculoskeletal:        General: No swelling or tenderness.     Cervical back: Normal range of motion and neck supple. No rigidity or tenderness.     Right lower leg: No edema.     Left lower leg: No edema.  Lymphadenopathy:     Cervical: No cervical adenopathy.  Skin:    General: Skin is warm and dry.     Findings: No rash.  Neurological:     Mental Status: He is alert.     Comments: Alert, speech clear. Motor/sens grossly intact bil.   Psychiatric:        Mood and Affect: Mood normal.     ED Results / Procedures / Treatments    Labs (all labs ordered are listed, but only abnormal results are displayed) Results for orders placed or performed during the hospital encounter of 01/31/23  Comprehensive metabolic panel  Result Value Ref Range   Sodium 127 (L) 135 - 145 mmol/L   Potassium 4.0 3.5 - 5.1 mmol/L   Chloride 96 (L) 98 - 111 mmol/L   CO2 23 22 - 32 mmol/L   Glucose, Bld 118 (H) 70 - 99 mg/dL   BUN 10 6 - 20 mg/dL   Creatinine, Ser 0.81 0.61 - 1.24 mg/dL   Calcium 8.5 (L) 8.9 - 10.3 mg/dL  Total Protein 6.7 6.5 - 8.1 g/dL   Albumin 3.8 3.5 - 5.0 g/dL   AST 15 15 - 41 U/L   ALT 20 0 - 44 U/L   Alkaline Phosphatase 43 38 - 126 U/L   Total Bilirubin 1.1 0.3 - 1.2 mg/dL   GFR, Estimated >60 >60 mL/min   Anion gap 8 5 - 15  Lactic acid, plasma  Result Value Ref Range   Lactic Acid, Venous 1.7 0.5 - 1.9 mmol/L  CBC with Differential  Result Value Ref Range   WBC 0.4 (LL) 4.0 - 10.5 K/uL   RBC 2.51 (L) 4.22 - 5.81 MIL/uL   Hemoglobin 7.0 (L) 13.0 - 17.0 g/dL   HCT 22.0 (L) 39.0 - 52.0 %   MCV 87.6 80.0 - 100.0 fL   MCH 27.9 26.0 - 34.0 pg   MCHC 31.8 30.0 - 36.0 g/dL   RDW 17.2 (H) 11.5 - 15.5 %   Platelets 44 (L) 150 - 400 K/uL   nRBC 0.0 0.0 - 0.2 %   Neutrophils Relative % 2 %   Neutro Abs 0.0 (LL) 1.7 - 7.7 K/uL   Lymphocytes Relative 65 %   Lymphs Abs 0.3 (L) 0.7 - 4.0 K/uL   Monocytes Relative 31 %   Monocytes Absolute 0.1 0.1 - 1.0 K/uL   Eosinophils Relative 0 %   Eosinophils Absolute 0.0 0.0 - 0.5 K/uL   Basophils Relative 0 %   Basophils Absolute 0.0 0.0 - 0.1 K/uL   Immature Granulocytes 2 %   Abs Immature Granulocytes 0.01 0.00 - 0.07 K/uL  Protime-INR  Result Value Ref Range   Prothrombin Time 14.7 11.4 - 15.2 seconds   INR 1.2 0.8 - 1.2   DG Chest Port 1 View  Result Date: 01/31/2023 CLINICAL DATA:  Fevers.  Shortness of breath. EXAM: PORTABLE CHEST 1 VIEW COMPARISON:  01/10/2023 FINDINGS: There is a left chest wall port a catheter with tip at the cavoatrial junction. Heart  size appears within normal limits. Lung volumes are low. There is no pleural effusion or edema. No airspace opacities. Osseous structures appear intact. IMPRESSION: No acute cardiopulmonary abnormalities. Electronically Signed   By: Kerby Moors M.D.   On: 01/31/2023 15:34   NM PET Image Restag (PS) Skull Base To Thigh  Result Date: 01/18/2023 CLINICAL DATA:  Subsequent treatment strategy for Burkitt's lymphoma. Status post 3 cycles of chemo immunotherapy. EXAM: NUCLEAR MEDICINE PET SKULL BASE TO THIGH TECHNIQUE: 13.68 mCi F-18 FDG was injected intravenously. Full-ring PET imaging was performed from the skull base to thigh after the radiotracer. CT data was obtained and used for attenuation correction and anatomic localization. Fasting blood glucose: 96 mg/dl COMPARISON:  PET-CT 11/19/2022 FINDINGS: Mediastinal blood pool activity: SUV max 2.06 Liver activity: SUV max 4.13 NECK: No residual hypermetabolic adenopathy. The large bulk E right neck mass extending into the right subclavicular area has near completely resolved. Residual subcutaneous disease anterior to the right sternocleidomastoid muscle is noted. This measures 2.5 x 2.4 cm and previously measured 5 x 5 cm. SUV max is 3.12 and was previously 33.13. Incidental CT findings: None. CHEST: The substernal/upper right anterior mediastinal disease has resolved. No residual measurable tumor or hypermetabolism. The right axillary adenopathy the has resolved. No residual measurable disease or hypermetabolism. Incidental CT findings: Left IJ power port in good position without complicating features. No enlarged mediastinal or hilar lymph nodes. No worrisome pulmonary findings. ABDOMEN/PELVIS: No abnormal hypermetabolic activity within the liver, pancreas, adrenal glands, or spleen.  No hypermetabolic lymph nodes in the abdomen or pelvis. Incidental CT findings: Thick wall bladder with pericystic interstitial/inflammatory changes could suggest cystitis. Recommend  clinical correlation. SKELETON: Diffuse marrow FDG uptake likely related to chemotherapy or marrow stimulating drugs. Incidental CT findings: None. IMPRESSION: 1. PET-CT findings suggest an excellent response to treatment ([place] 1). The large markedly hypermetabolic right nodal neck mass extending into the thoracic inlet and upper right anterior mediastinum has largely resolved. Only a small area of residual disease in the subcutaneous fat overlying the right sternocleidomastoid muscle remains ([place] 3). 2. No disease below the diaphragm. 3. Mild diffuse marrow uptake likely due to chemotherapy or marrow stimulating drugs. 4. Thick wall bladder with pericystic interstitial/inflammatory changes could suggest cystitis. Electronically Signed   By: Marijo Sanes M.D.   On: 01/18/2023 09:00   DG Chest 2 View  Result Date: 01/10/2023 CLINICAL DATA:  Shortness of breath and fever EXAM: CHEST - 2 VIEW COMPARISON:  Chest x-ray and CT 11/10/2022. FINDINGS: Left IJ chest port with tip at the SVC right atrial junction. Underinflation. Developing mild opacity at the left lung base. Slight asymmetric vascular congestion at the left hilum. No pneumothorax or effusion. There is large mint of the mediastinum particularly along the aortopulmonary window. Please correlate for any known history including abnormal nodes as seen on CT scan of 11/10/2022. This is more prominent than the prior x-ray IMPRESSION: Chest port. Underinflation with developing opacity left lung base. Possible infiltrate versus atelectasis. Recommend follow-up Widened mediastinum with fullness in the aortopulmonary window. Please correlate for known history of abnormal lymph nodes Electronically Signed   By: Jill Side M.D.   On: 01/10/2023 18:26     EKG None  Radiology DG Chest [facility] 1 View  Result Date: 01/31/2023 CLINICAL DATA:  Fevers.  Shortness of breath. EXAM: PORTABLE CHEST 1 VIEW COMPARISON:  01/10/2023 FINDINGS: There is a left  chest wall port a catheter with tip at the cavoatrial junction. Heart size appears within normal limits. Lung volumes are low. There is no pleural effusion or edema. No airspace opacities. Osseous structures appear intact. IMPRESSION: No acute cardiopulmonary abnormalities. Electronically Signed   By: Kerby Moors M.D.   On: 01/31/2023 15:34    Procedures Procedures    Medications Ordered in ED Medications  acetaminophen (TYLENOL) tablet 1,000 mg (1,000 mg Oral Given 01/31/23 1622)  ceFEPIme (MAXIPIME) 2 g in sodium chloride 0.9 % 100 mL IVPB (0 g Intravenous Stopped 01/31/23 1655)  lactated ringers bolus 1,000 mL (1,000 mLs Intravenous New Bag/Given 01/31/23 1621)    ED Course/ Medical Decision Making/ A&P                             Medical Decision Making Problems Addressed: Burkitt lymphoma, unspecified body region [facility]): chronic illness or injury with exacerbation, progression, or side effects of treatment that poses a threat to life or bodily functions Chronic anemia: chronic illness or injury with exacerbation, progression, or side effects of treatment that poses a threat to life or bodily functions Febrile neutropenia ([place]): acute illness or injury with systemic symptoms that poses a threat to life or bodily functions HIV disease ([place]): chronic illness or injury that poses a threat to life or bodily functions Thrombocytopenia ([place]): acute illness or injury that poses a threat to life or bodily functions  Amount and/or Complexity of Data Reviewed Independent Historian:     Details: Family/friend, hx External Data Reviewed: labs and notes.  Labs: ordered. Decision-making details documented in ED Course. Radiology: ordered and independent interpretation performed. Decision-making details documented in ED Course.  Risk OTC drugs. Prescription drug management. Decision regarding hospitalization.   Iv ns. Continuous pulse ox and cardiac monitoring. Labs ordered/sent. Imaging  ordered.   Differential diagnosis includes febrile neutropenia, pna, viral syndrome, etc . Dispo decision including potential need for admission considered - will get labs and imaging and reassess.   Reviewed nursing notes and prior charts for additional history. External reports reviewed.   Cardiac monitor: sinus rhythm, rate 104.  Cultures sent. ANC reported v low. Given fever - broad spectrum iv abx given.   Labs reviewed/interpreted by me - anc v low. Hgb low.   Xrays reviewed/interpreted by me - no def pna.   Recheck, no faintness, no increased wob. No new c/o.   Hospitalists consulted for admission. Discussed pt and labs - will admit.   CRITICAL CARE RE: febrile neutropenia, acute febrile illness, anemia.  Performed by: Mirna Mires Total critical care time: 40 minutes Critical care time was exclusive of separately billable procedures and treating other patients. Critical care was necessary to treat or prevent imminent or life-threatening deterioration. Critical care was time spent personally by me on the following activities: development of treatment plan with patient and/or surrogate as well as nursing, discussions with consultants, evaluation of patient's response to treatment, examination of patient, obtaining history from patient or surrogate, ordering and performing treatments and interventions, ordering and review of laboratory studies, ordering and review of radiographic studies, pulse oximetry and re-evaluation of patient's condition.          Final Clinical Impression(s) / ED Diagnoses Final diagnoses:  None    Rx / DC Orders ED Discharge Orders     None           Lajean Saver, MD 01/31/23 1714

## 2023-01-31 NOTE — Progress Notes (Signed)
Mr. Ghobrial does not want to stay  accessed for blood products tomorrow 02/01/2023.

## 2023-01-31 NOTE — Progress Notes (Signed)
[name]  HEMATOLOGY/ONCOLOGY INPATIENT PROGRESS NOTE  Date of Service: 01/31/2023  Inpatient Attending: .Lucillie Garfinkel, MD   SUBJECTIVE  Patient seen in medical oncology evaluation.  He is well-known to the oncology clinic and is on treatment for his Burkitt's lymphoma and had his cycle 4 of EPOCH-R recently.  He was admitted with neutropenic fever with with a temperature of 103 F.  Patient has some sore throat but no other acute new focal symptoms.  ANC 0.  Also symptomatic anemia with a hemoglobin of 7 and platelet count in the 40k range. He is receiving PRBC transfusion.  Has already received Udenyca on 01/27/2023. He has been started on broad-spectrum antibiotics and cultures have been sent out. Mother at bedside.  OBJECTIVE:  NAD  PHYSICAL EXAMINATION: . Vitals:   01/31/23 1700 01/31/23 1715 01/31/23 1730 01/31/23 1929  BP: 119/63   (!) 124/57  Pulse: (!) 103 (!) 120 (!) 102 (!) 102  Resp: 19 17 20 18  $ Temp:    99.7 F (37.6 C)  TempSrc:    Oral  SpO2: 100% 100% 100% 100%  Weight:      Height:       Filed Weights   01/31/23 1518  Weight: 275 lb 9.2 oz (125 kg)   .Body mass index is 36.36 kg/m.  GENERAL:alert, in no acute distress and comfortable SKIN: skin color, texture, turgor are normal, no rashes or significant lesions EYES: normal, conjunctiva are pink and non-injected, sclera clear OROPHARYNX:no exudate, no erythema and lips, buccal mucosa, and tongue normal  NECK: supple, no JVD, thyroid normal size, non-tender, without nodularity LYMPH:  no palpable lymphadenopathy in the cervical, axillary or inguinal LUNGS: clear to auscultation with normal respiratory effort HEART: regular rate & rhythm,  no murmurs and no lower extremity edema ABDOMEN: abdomen soft, non-tender, normoactive bowel sounds  Musculoskeletal: no cyanosis of digits and no clubbing  PSYCH: alert & oriented x 3 with fluent speech NEURO: no focal motor/sensory deficits  MEDICAL HISTORY:   Past Medical History:  Diagnosis Date   Anal fissure    HIV infection ([place])    Internal hemorrhoids    Obesity    Rectal ulcer     SURGICAL HISTORY: Past Surgical History:  Procedure Laterality Date   IR IMAGING GUIDED PORT INSERTION  11/18/2022    SOCIAL HISTORY: Social History   Socioeconomic History   Marital status: Single    Spouse name: Not on file   Number of children: Not on file   Years of education: Not on file   Highest education level: Not on file  Occupational History   Not on file  Tobacco Use   Smoking status: Never   Smokeless tobacco: Never  Vaping Use   Vaping Use: Never used  Substance and Sexual Activity   Alcohol use: No   Drug use: No   Sexual activity: Yes    Comment: declined condoms  Other Topics Concern   Not on file  Social History Narrative   Not on file   Social Determinants of Health   Financial Resource Strain: Low Risk  (10/24/2022)   Overall Financial Resource Strain (CARDIA)    Difficulty of Paying Living Expenses: Not hard at all  Food Insecurity: No Food Insecurity (01/20/2023)   Hunger Vital Sign    Worried About Running Out of Food in the Last Year: Never true    Ran Out of Food in the Last Year: Never true  Transportation Needs: No Transportation Needs (01/20/2023)  PRAPARE - Hydrologist (Medical): No    Lack of Transportation (Non-Medical): No  Physical Activity: Sufficiently Active (10/24/2022)   Exercise Vital Sign    Days of Exercise per Week: 3 days    Minutes of Exercise per Session: 60 min  Recent Concern: Physical Activity - Insufficiently Active (10/24/2022)   Exercise Vital Sign    Days of Exercise per Week: 3 days    Minutes of Exercise per Session: 20 min  Stress: No Stress Concern Present (10/24/2022)   [place] of Stress : Not at all  Social Connections: Unknown (10/24/2022)   Social Connection and  Isolation Panel [NHANES]    Frequency of Communication with Friends and Family: Twice a week    Frequency of Social Gatherings with Friends and Family: Twice a week    Attends Religious Services: 1 to 4 times per year    Active Member of [employer] or Organizations: No    Attends Archivist Meetings: 1 to 4 times per year    Marital Status: Patient refused  Intimate Partner Violence: Not At Risk (01/20/2023)   Humiliation, Afraid, Rape, and Kick questionnaire    Fear of Current or Ex-Partner: No    Emotionally Abused: No    Physically Abused: No    Sexually Abused: No    FAMILY HISTORY: Family History  Problem Relation Age of Onset   Hypertension Father    Throat cancer Father    Heart attack Brother    Colon cancer Neg Hx    Esophageal cancer Neg Hx    Stomach cancer Neg Hx     ALLERGIES:  has No Known Allergies.  MEDICATIONS:  Scheduled Meds:  sodium chloride   Intravenous Once   bictegravir-emtricitabine-tenofovir AF  1 tablet Oral Daily   docusate sodium  100 mg Oral BID   hydrocortisone  1 Application Rectal TID   sodium bicarbonate/sodium chloride   Mouth Rinse QID   Continuous Infusions:  sodium chloride     [START ON 02/01/2023] ceFEPime (MAXIPIME) IV     PRN Meds:.acetaminophen **OR** [DISCONTINUED] acetaminophen, acetaminophen, methylPREDNISolone (SOLU-MEDROL) injection, ondansetron **OR** ondansetron (ZOFRAN) IV, senna-docusate, traZODone  REVIEW OF SYSTEMS:    10 Point review of Systems was done is negative except as noted above.   LABORATORY DATA:  I have reviewed the data as listed  .    Latest Ref Rng & Units 01/31/2023    3:21 PM 01/31/2023   12:59 PM 01/27/2023    9:26 AM  CBC  WBC 4.0 - 10.5 K/uL 0.4  0.5  1.0   Hemoglobin 13.0 - 17.0 g/dL 7.0  7.6  8.0   Hematocrit 39.0 - 52.0 % 22.0  23.2  24.3   Platelets 150 - 400 K/uL 44  53  216    ANC 0 .    Latest Ref Rng & Units 01/31/2023    3:21 PM 01/31/2023   12:59 PM 01/27/2023    9:26 AM   CMP  Glucose 70 - 99 mg/dL 118  105  145   BUN 6 - 20 mg/dL 10  8  13   $ Creatinine 0.61 - 1.24 mg/dL 0.81  0.77  0.82   Sodium 135 - 145 mmol/L 127  134  136   Potassium 3.5 - 5.1 mmol/L 4.0  4.1  3.3   Chloride 98 - 111 mmol/L 96  99  103   CO2  22 - 32 mmol/L 23  27  27   $ Calcium 8.9 - 10.3 mg/dL 8.5  9.3  9.0   Total Protein 6.5 - 8.1 g/dL 6.7  6.7  6.4   Total Bilirubin 0.3 - 1.2 mg/dL 1.1  0.9  1.1   Alkaline Phos 38 - 126 U/L 43  49  48   AST 15 - 41 U/L 15  8  11   $ ALT 0 - 44 U/L 20  17  16      $ RADIOGRAPHIC STUDIES: I have personally reviewed the radiological images as listed and agreed with the findings in the report. DG Chest Port 1 View  Result Date: 01/31/2023 CLINICAL DATA:  Fevers.  Shortness of breath. EXAM: PORTABLE CHEST 1 VIEW COMPARISON:  01/10/2023 FINDINGS: There is a left chest wall port a catheter with tip at the cavoatrial junction. Heart size appears within normal limits. Lung volumes are low. There is no pleural effusion or edema. No airspace opacities. Osseous structures appear intact. IMPRESSION: No acute cardiopulmonary abnormalities. Electronically Signed   By: Kerby Moors M.D.   On: 01/31/2023 15:34   NM PET Image Restag (PS) Skull Base To Thigh  Result Date: 01/18/2023 CLINICAL DATA:  Subsequent treatment strategy for Burkitt's lymphoma. Status post 3 cycles of chemo immunotherapy. EXAM: NUCLEAR MEDICINE PET SKULL BASE TO THIGH TECHNIQUE: 13.68 mCi F-18 FDG was injected intravenously. Full-ring PET imaging was performed from the skull base to thigh after the radiotracer. CT data was obtained and used for attenuation correction and anatomic localization. Fasting blood glucose: 96 mg/dl COMPARISON:  PET-CT 11/19/2022 FINDINGS: Mediastinal blood pool activity: SUV max 2.06 Liver activity: SUV max 4.13 NECK: No residual hypermetabolic adenopathy. The large bulk E right neck mass extending into the right subclavicular area has near completely resolved. Residual  subcutaneous disease anterior to the right sternocleidomastoid muscle is noted. This measures 2.5 x 2.4 cm and previously measured 5 x 5 cm. SUV max is 3.12 and was previously 33.13. Incidental CT findings: None. CHEST: The substernal/upper right anterior mediastinal disease has resolved. No residual measurable tumor or hypermetabolism. The right axillary adenopathy the has resolved. No residual measurable disease or hypermetabolism. Incidental CT findings: Left IJ power port in good position without complicating features. No enlarged mediastinal or hilar lymph nodes. No worrisome pulmonary findings. ABDOMEN/PELVIS: No abnormal hypermetabolic activity within the liver, pancreas, adrenal glands, or spleen. No hypermetabolic lymph nodes in the abdomen or pelvis. Incidental CT findings: Thick wall bladder with pericystic interstitial/inflammatory changes could suggest cystitis. Recommend clinical correlation. SKELETON: Diffuse marrow FDG uptake likely related to chemotherapy or marrow stimulating drugs. Incidental CT findings: None. IMPRESSION: 1. PET-CT findings suggest an excellent response to treatment ([place] 1). The large markedly hypermetabolic right nodal neck mass extending into the thoracic inlet and upper right anterior mediastinum has largely resolved. Only a small area of residual disease in the subcutaneous fat overlying the right sternocleidomastoid muscle remains ([place] 3). 2. No disease below the diaphragm. 3. Mild diffuse marrow uptake likely due to chemotherapy or marrow stimulating drugs. 4. Thick wall bladder with pericystic interstitial/inflammatory changes could suggest cystitis. Electronically Signed   By: Marijo Sanes M.D.   On: 01/18/2023 09:00   DG Chest 2 View  Result Date: 01/10/2023 CLINICAL DATA:  Shortness of breath and fever EXAM: CHEST - 2 VIEW COMPARISON:  Chest x-ray and CT 11/10/2022. FINDINGS: Left IJ chest port with tip at the SVC right atrial junction. Underinflation.  Developing mild opacity at the left  lung base. Slight asymmetric vascular congestion at the left hilum. No pneumothorax or effusion. There is large mint of the mediastinum particularly along the aortopulmonary window. Please correlate for any known history including abnormal nodes as seen on CT scan of 11/10/2022. This is more prominent than the prior x-ray IMPRESSION: Chest port. Underinflation with developing opacity left lung base. Possible infiltrate versus atelectasis. Recommend follow-up Widened mediastinum with fullness in the aortopulmonary window. Please correlate for known history of abnormal lymph nodes Electronically Signed   By: Jill Side M.D.   On: 01/10/2023 18:26    ASSESSMENT & PLAN:   42 year old male with HIV/AIDS with newly diagnosed Burkitt's lymphoma   #1  Stage II recently diagnosed Burkitt's lymphoma Presenting with rapidly growing right neck mass extending into the chest, bilateral x-ray lymphadenopathy and also concern for possible involvement of retroperitoneal lymph nodes. Concerning for at least stage II possibly stage III disease. Some borderline lymphadenopathy in the abdomen could also be from his recently diagnosed HIV/AIDS. No clinical symptoms suggestive of CNS involvement at this time. Brain MRI negative.  No constitutional symptoms. No significant cytopenias to suggest bone marrow involvement. Discussed with patient and he is not interested in fertility preservation or sperm banking. First presented in August at [facility] and was treated empirically with steroids and antibiotics with improvement in swelling prior to additional progression. -Currently on dose adjusted EPOCH-R. Status post 1 cycle. Tolerated well overall.  -Scheduled for cycle #2 on 12/10/22. Will help facilitate inpatient admission. -Outpatient follow up on 12/17/22 for Rituxan and Neulasta   #2 HIV/AIDS recently diagnosed 4 months ago. Following Dr. Elna Breslow from [place] and currently  taking Biktarvy.   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  Patient reports this was completely treated.   #4 pancytopenia likely due to chemotherapy.  Accentuated by the patient's underlying HIV/AIDS, antiretroviral therapy and Bactrim .   #5  Neutropenic fever. Patient with significant chemotherapy related neutropenia with ANC of 0 Patient was sent from the cancer center for hospitalization.  #6 symptomatic anemia  #7 thrombocytopenia related to chemotherapy Plan Patient was admitted to the hospital for severe neutropenia with fever despite using prophylactic Bactrim and Augmentin. -She has already received Udenyca on 01/27/2023 and showed hopefully start having his white count started to normalize soon. -Medication for additional G-CSF at this time. -Pancultures per hospital medicine -Antibiotics per hospital medicine.  Will need cefepime plus vancomycin. -Sodium chloride/baking soda mouthwashes for mild soreness. -Transfuse as needed to maintain hemoglobin more than or equal to 8 -Transfuse platelets as needed for platelets less than 20k in the setting of possible sepsis. -We shall need to reduce the doses of his chemotherapy for cycle 5 and cycle 6 given recurrent significant neutropenia with fevers. -Continue HAART  The total time spent in the appointment was 35 minutes*.  All of the patient's questions were answered with apparent satisfaction. The patient knows to call the clinic with any problems, questions or concerns.   Sullivan Lone MD MS AAHIVMS [facility] Hematology/Oncology Physician [facility]  .*Total Encounter Time as defined by the Centers for Medicare and Medicaid Services includes, in addition to the face-to-face time of a patient visit (documented in the note above) non-face-to-face time: obtaining and reviewing outside history, ordering and reviewing medications, tests or procedures, care coordination (communications with other health care  professionals or caregivers) and documentation in the medical record.   01/31/2023 8:02 PM

## 2023-01-31 NOTE — ED Notes (Signed)
Patient refused to let writer swab him for COVID. He insisted to self-swab nose.

## 2023-01-31 NOTE — H&P (Signed)
History and Physical  Logan Ward [id number] DOB: [birth year] DOA: 01/31/2023   PCP: Patient, No Pcp Per   Patient coming from: Home via Oncology Clinic   Chief Complaint: Fever, Neutropenia   HPI: Logan Ward is a 42 y.o. 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  Patient states that he has been feeling well, specifically denies any cough, rashes on skin, chest pain, shortness of breath, nausea or vomiting.  A couple of days ago, he started having some slight throat soreness but otherwise has been feeling well.  Came to the oncology clinic today for routine labs, was found to be neutropenic and in fact pancytopenic.  As such, he was sent to the emergency department for evaluation.  ED Course: In the emergency department, he was found to have a fever of 103.1, heart rate 127, blood pressure 110/56.  Saturating 100% on room air.  Lab work confirms pancytopenia, with WBC 0.4, hemoglobin 7, platelets 44,000.  Sodium is 127, potassium 4.0, normal renal function.  Lactate 1.7.  X-ray is unremarkable, urinalysis was ordered and pending.  Review of Systems: Please see HPI for pertinent positives and negatives. A complete 10 system review of systems are otherwise negative.  Past Medical History:  Diagnosis Date   Anal fissure    HIV infection ([place])    Internal hemorrhoids    Obesity    Rectal ulcer    Past Surgical History:  Procedure Laterality Date   IR IMAGING GUIDED PORT INSERTION  11/18/2022    Social History:  reports that he has never smoked. He has never used smokeless tobacco. He reports that he does not drink alcohol and does not use drugs.   No Known Allergies  Family History  Problem Relation Age of Onset   Hypertension Father    Throat cancer Father    Heart attack Brother    Colon  cancer Neg Hx    Esophageal cancer Neg Hx    Stomach cancer Neg Hx      Prior to Admission medications   Medication Sig Start Date End Date Taking? Authorizing Provider  amoxicillin-clavulanate (AUGMENTIN) 875-125 MG tablet Take 1 tablet by mouth every 12 (twelve) hours for 10 days. 01/27/23 02/06/23  Brunetta Genera, MD  Ascorbic Acid (VITAMIN C PO) Take 1 tablet by mouth daily.    [provider]  B Complex-C (B-COMPLEX WITH VITAMIN C) tablet Take 1 tablet by mouth daily. 01/25/23   Brunetta Genera, MD  bictegravir-emtricitabine-tenofovir AF (BIKTARVY) 50-200-25 MG TABS tablet Take 1 tablet by mouth daily. 12/19/22   Golden Circle, FNP  dexamethasone (DECADRON) 4 MG tablet Take 1 tablet (4 mg total) by mouth 2 (two) times daily with breakfast and lunch for 2 days after completion of chemotherapy 12/14/22   Brunetta Genera, MD  hydrocortisone (ANUSOL-HC) 2.5 % rectal cream Place rectally as directed 3 times daily. Patient taking differently: Place 1 Application rectally 3 (three) times daily. 01/13/23   Shelly Coss, MD  lidocaine-prilocaine (EMLA) cream Apply to affected area once 11/18/22   Brunetta Genera, MD  magic mouthwash (nystatin, lidocaine, diphenhydrAMINE, alum & mag hydroxide) suspension Take 5 mLs by mouth 4 times daily as needed for mouth pain. 01/24/23 01/31/23  Brunetta Genera, MD  ondansetron (ZOFRAN) 8 MG tablet Take 1 tablet (8 mg total) by mouth every  8 (eight) hours as needed for nausea. 11/22/22   Brunetta Genera, MD  polyethylene glycol (MIRALAX / GLYCOLAX) 17 g packet Take 1 packet (17 g) by mouth daily. 11/23/22   Brunetta Genera, MD  prochlorperazine (COMPAZINE) 10 MG tablet Take 1 tablet (10 mg total) by mouth every 6 (six) hours as needed. 12/02/22   Heilingoetter, Cassandra L, PA-C  senna-docusate (SENOKOT-S) 8.6-50 MG tablet Take 2 tablets by mouth at bedtime as needed for mild constipation. 12/14/22   Brunetta Genera, MD   Sodium Chloride-Sodium Bicarb (SODIUM BICARBONATE/SODIUM CHLORIDE) SOLN Use 1 Application by Mouth Rinse route as needed for dry mouth. 01/13/23   Shelly Coss, MD  sulfamethoxazole-trimethoprim (BACTRIM DS) 800-160 MG tablet Take 1 tablet by mouth daily. 12/19/22   Golden Circle, FNP    Physical Exam: BP 122/80   Pulse (!) 111   Temp (!) 100.8 F (38.2 C) (Oral)   Resp 19   Ht 6' 1"$  (1.854 m)   Wt 125 kg   SpO2 96%   BMI 36.36 kg/m   General:  Alert, oriented, calm, in no acute distress, looks nontoxic and comfortable, Eyes: EOMI, clear conjuctivae, white sclerea Neck: supple, no masses, trachea mildline  Cardiovascular: RRR, no murmurs or rubs, no peripheral edema  Respiratory: clear to auscultation bilaterally, no wheezes, no crackles  Abdomen: soft, nontender, nondistended, normal bowel tones heard  Skin: dry, no rashes  Musculoskeletal: no joint effusions, normal range of motion  Psychiatric: appropriate affect, normal speech  Neurologic: extraocular muscles intact, clear speech, moving all extremities with intact sensorium          Labs on Admission:  Basic Metabolic Panel: Recent Labs  Lab 01/27/23 0926 01/31/23 1259 01/31/23 1521  NA 136 134* 127*  K 3.3* 4.1 4.0  CL 103 99 96*  CO2 27 27 23  $ GLUCOSE 145* 105* 118*  BUN 13 8 10  $ CREATININE 0.82 0.77 0.81  CALCIUM 9.0 9.3 8.5*   Liver Function Tests: Recent Labs  Lab 01/27/23 0926 01/31/23 1259 01/31/23 1521  AST 11* 8* 15  ALT 16 17 20  $ ALKPHOS 48 49 43  BILITOT 1.1 0.9 1.1  PROT 6.4* 6.7 6.7  ALBUMIN 4.0 4.1 3.8   No results for input(s): "LIPASE", "AMYLASE" in the last 168 hours. No results for input(s): "AMMONIA" in the last 168 hours. CBC: Recent Labs  Lab 01/27/23 0926 01/31/23 1259 01/31/23 1521  WBC 1.0* 0.5* 0.4*  NEUTROABS 0.7* 0.0* 0.0*  HGB 8.0* 7.6* 7.0*  HCT 24.3* 23.2* 22.0*  MCV 85.0 84.7 87.6  PLT 216 53* 44*   Cardiac Enzymes: No results for input(s): "CKTOTAL",  "CKMB", "CKMBINDEX", "TROPONINI" in the last 168 hours.  BNP (last 3 results) No results for input(s): "BNP" in the last 8760 hours.  ProBNP (last 3 results) No results for input(s): "PROBNP" in the last 8760 hours.  CBG: No results for input(s): "GLUCAP" in the last 168 hours.  Radiological Exams on Admission: DG Chest Port 1 View  Result Date: 01/31/2023 CLINICAL DATA:  Fevers.  Shortness of breath. EXAM: PORTABLE CHEST 1 VIEW COMPARISON:  01/10/2023 FINDINGS: There is a left chest wall port a catheter with tip at the cavoatrial junction. Heart size appears within normal limits. Lung volumes are low. There is no pleural effusion or edema. No airspace opacities. Osseous structures appear intact. IMPRESSION: No acute cardiopulmonary abnormalities. Electronically Signed   By: Kerby Moors M.D.   On: 01/31/2023 15:34    Assessment/Plan  Principal Problem:   Neutropenic fever ([place]) -neutropenia as a result of recently completed EPOCH-R -inpatient admission -Follow blood cultures, await UA and order culture if indicated -Continue empiric IV cefepime -Tylenol for recurrent fever -Message sent to Dr. Irene Limbo to notify him of patient's admission Active Problems:   AIDS (acquired immune deficiency syndrome) ([place]) -continue Biktarvy   Submental lymphadenopathy   Pancytopenia ([place]) -no chemical DVT prophylaxis due to thrombocytopenia, hemoglobin down to 7.0, patient will be infused 1 unit of blood   Hyponatremia -mild, asymptomatic, will hydrate gently and check sodium level in the morning  DVT prophylaxis: SCDs   Code Status: Full   Consults called: None, Dr. Irene Limbo notified of admission.   Admission status: Inpatient   Time spent: 42 minutes  Florine Sprenkle Neva Seat MD [facility] Pager [phone]  If 7PM-7AM, please contact night-coverage [email or web address] Password [facility]  01/31/2023, 5:32 PM

## 2023-01-31 NOTE — ED Notes (Signed)
ED TO INPATIENT HANDOFF REPORT  ED Nurse Name and Phone #: Alroy Bailiff Name/Age/Gender Logan Ward 42 y.o. male Room/Bed: WA03/WA03  Code Status   Code Status: Full Code  Home/SNF/Other Home Patient oriented to: self, place, time, and situation Is this baseline? Yes   Triage Complete: Triage complete  Chief Complaint Neutropenic fever ([place]) [D70.9, R50.81]  Triage Note Pt coming from [facility] center w/ c/o fever of 103.1. Pt hx lymphoma and HIV. Supposed to get blood transfusion tomorrow. Hgb 7.2.    Allergies No Known Allergies  Level of Care/Admitting Diagnosis ED Disposition     ED Disposition  Admit   Condition  --   Comment  Hospital Area: [facility] [[id number]]  Level of Care: Med-Surg [16]  May admit patient to [facility] or [facility] if equivalent level of care is available:: No  Covid Evaluation: Asymptomatic - no recent exposure (last 10 days) testing not required  Diagnosis: Neutropenic fever [facility]) [id number]  Admitting Physician: Lucillie Garfinkel [id number]  Attending Physician: [place], MIR Kari.Conine [id number]  Certification:: I certify this patient will need inpatient services for at least 2 midnights  Estimated Length of Stay: 3          B Medical/Surgery History Past Medical History:  Diagnosis Date   Anal fissure    HIV infection ([place])    Internal hemorrhoids    Obesity    Rectal ulcer    Past Surgical History:  Procedure Laterality Date   IR IMAGING GUIDED PORT INSERTION  11/18/2022     A IV Location/Drains/Wounds Patient Lines/Drains/Airways Status     Active Line/Drains/Airways     Name Placement date Placement time Site Days   Implanted Port 11/18/22 Left Chest 11/18/22  1118  Chest  74            Intake/Output Last 24 hours  Intake/Output Summary (Last 24 hours) at 01/31/2023 1734 Last data filed at 01/31/2023 1655 Gross per 24 hour  Intake 99.25 ml  Output --  Net 99.25 ml    Labs/Imaging Results  for orders placed or performed during the hospital encounter of 01/31/23 (from the past 48 hour(s))  Comprehensive metabolic panel     Status: Abnormal   Collection Time: 01/31/23  3:21 PM  Result Value Ref Range   Sodium 127 (L) 135 - 145 mmol/L   Potassium 4.0 3.5 - 5.1 mmol/L   Chloride 96 (L) 98 - 111 mmol/L   CO2 23 22 - 32 mmol/L   Glucose, Bld 118 (H) 70 - 99 mg/dL    Comment: Glucose reference range applies only to samples taken after fasting for at least 8 hours.   BUN 10 6 - 20 mg/dL   Creatinine, Ser 0.81 0.61 - 1.24 mg/dL   Calcium 8.5 (L) 8.9 - 10.3 mg/dL   Total Protein 6.7 6.5 - 8.1 g/dL   Albumin 3.8 3.5 - 5.0 g/dL   AST 15 15 - 41 U/L   ALT 20 0 - 44 U/L   Alkaline Phosphatase 43 38 - 126 U/L   Total Bilirubin 1.1 0.3 - 1.2 mg/dL   GFR, Estimated >60 >60 mL/min    Comment: (NOTE) Calculated using the CKD-EPI Creatinine Equation (2021)    Anion gap 8 5 - 15    Comment: Performed at [facility], [place] [address], [place], [place] [address]  CBC with Differential     Status: Abnormal   Collection Time: 01/31/23  3:21  PM  Result Value Ref Range   WBC 0.4 (LL) 4.0 - 10.5 K/uL    Comment: REPEATED TO VERIFY WHITE COUNT CONFIRMED ON SMEAR THIS CRITICAL RESULT HAS VERIFIED AND BEEN CALLED TO GRAVELI, C BY GLIBERT, LATRICE ON 02 16 2024 AT 1622, AND HAS BEEN READ BACK.     RBC 2.51 (L) 4.22 - 5.81 MIL/uL   Hemoglobin 7.0 (L) 13.0 - 17.0 g/dL   HCT 22.0 (L) 39.0 - 52.0 %   MCV 87.6 80.0 - 100.0 fL   MCH 27.9 26.0 - 34.0 pg   MCHC 31.8 30.0 - 36.0 g/dL   RDW 17.2 (H) 11.5 - 15.5 %   Platelets 44 (L) 150 - 400 K/uL    Comment: SPECIMEN CHECKED FOR CLOTS Immature Platelet Fraction may be clinically indicated, consider ordering this additional test [id number] REPEATED TO VERIFY PLATELET COUNT CONFIRMED BY SMEAR    nRBC 0.0 0.0 - 0.2 %   Neutrophils Relative % 2 %   Neutro Abs 0.0 (LL) 1.7 - 7.7 K/uL    Comment: REPEATED TO VERIFY THIS CRITICAL  RESULT HAS VERIFIED AND BEEN CALLED TO GRAVELI, C BY GLIBERT, LATRICE ON 02 16 2024 AT 1622, AND HAS BEEN READ BACK.     Lymphocytes Relative 65 %   Lymphs Abs 0.3 (L) 0.7 - 4.0 K/uL   Monocytes Relative 31 %   Monocytes Absolute 0.1 0.1 - 1.0 K/uL   Eosinophils Relative 0 %   Eosinophils Absolute 0.0 0.0 - 0.5 K/uL   Basophils Relative 0 %   Basophils Absolute 0.0 0.0 - 0.1 K/uL   Immature Granulocytes 2 %   Abs Immature Granulocytes 0.01 0.00 - 0.07 K/uL    Comment: Performed at [facility], [place] [address], [place], [place] [address]  Protime-INR     Status: None   Collection Time: 01/31/23  3:21 PM  Result Value Ref Range   Prothrombin Time 14.7 11.4 - 15.2 seconds   INR 1.2 0.8 - 1.2    Comment: (NOTE) INR goal varies based on device and disease states. Performed at [facility], [place] [address], [place], [place] [address]   Lactic acid, plasma     Status: None   Collection Time: 01/31/23  3:22 PM  Result Value Ref Range   Lactic Acid, Venous 1.7 0.5 - 1.9 mmol/L    Comment: Performed at [facility], [place] [address], [place], [place] [address]  Group A Strep by PCR     Status: None   Collection Time: 01/31/23  4:25 PM   Specimen: Sterile Swab  Result Value Ref Range   Group A Strep by PCR NOT DETECTED NOT DETECTED    Comment: Performed at [facility], [place] [address] [place], [place] [address]  Resp panel by RT-PCR (RSV, Flu A&B, Covid)     Status: None   Collection Time: 01/31/23  4:25 PM   Specimen: Nasal Swab  Result Value Ref Range   SARS Coronavirus 2 by RT PCR NEGATIVE NEGATIVE    Comment: (NOTE) SARS-CoV-2 target nucleic acids are NOT DETECTED.  The SARS-CoV-2 RNA is generally detectable in upper respiratory specimens during the acute phase of infection. The lowest concentration of SARS-CoV-2 viral copies this assay can detect is 138 copies/mL. A negative result does not preclude  SARS-Cov-2 infection and should not be used as the sole basis for treatment or other patient management decisions. A negative result may occur with  improper specimen  collection/handling, submission of specimen other than nasopharyngeal swab, presence of viral mutation(s) within the areas targeted by this assay, and inadequate number of viral copies(<138 copies/mL). A negative result must be combined with clinical observations, patient history, and epidemiological information. The expected result is Negative.  Fact Sheet for Patients:  EntrepreneurPulse.com.au  Fact Sheet for Healthcare Providers:  IncredibleEmployment.be  This test is no t yet approved or cleared by the [place] FDA and  has been authorized for detection and/or diagnosis of SARS-CoV-2 by FDA under an Emergency Use Authorization (EUA). This EUA will remain  in effect (meaning this test can be used) for the duration of the COVID-19 declaration under Section 564(b)(1) of the Act, 21 U.S.C.section 360bbb-3(b)(1), unless the authorization is terminated  or revoked sooner.       Influenza A by PCR NEGATIVE NEGATIVE   Influenza B by PCR NEGATIVE NEGATIVE    Comment: (NOTE) The Xpert Xpress SARS-CoV-2/FLU/RSV plus assay is intended as an aid in the diagnosis of influenza from Nasopharyngeal swab specimens and should not be used as a sole basis for treatment. Nasal washings and aspirates are unacceptable for Xpert Xpress SARS-CoV-2/FLU/RSV testing.  Fact Sheet for Patients: EntrepreneurPulse.com.au  Fact Sheet for Healthcare Providers: IncredibleEmployment.be  This test is not yet approved or cleared by the [place] FDA and has been authorized for detection and/or diagnosis of SARS-CoV-2 by FDA under an Emergency Use Authorization (EUA). This EUA will remain in effect (meaning this test can be used) for the duration of the COVID-19  declaration under Section 564(b)(1) of the Act, 21 U.S.C. section 360bbb-3(b)(1), unless the authorization is terminated or revoked.     Resp Syncytial Virus by PCR NEGATIVE NEGATIVE    Comment: (NOTE) Fact Sheet for Patients: EntrepreneurPulse.com.au  Fact Sheet for Healthcare Providers: IncredibleEmployment.be  This test is not yet approved or cleared by the [place] FDA and has been authorized for detection and/or diagnosis of SARS-CoV-2 by FDA under an Emergency Use Authorization (EUA). This EUA will remain in effect (meaning this test can be used) for the duration of the COVID-19 declaration under Section 564(b)(1) of the Act, 21 U.S.C. section 360bbb-3(b)(1), unless the authorization is terminated or revoked.  Performed at [facility], [place] [address] [place], [place] [address]    DG Chest Port 1 View  Result Date: 01/31/2023 CLINICAL DATA:  Fevers.  Shortness of breath. EXAM: PORTABLE CHEST 1 VIEW COMPARISON:  01/10/2023 FINDINGS: There is a left chest wall port a catheter with tip at the cavoatrial junction. Heart size appears within normal limits. Lung volumes are low. There is no pleural effusion or edema. No airspace opacities. Osseous structures appear intact. IMPRESSION: No acute cardiopulmonary abnormalities. Electronically Signed   By: Kerby Moors M.D.   On: 01/31/2023 15:34    Pending Labs Unresulted Labs (From admission, onward)     Start     Ordered   02/01/23 [id number]  Basic metabolic panel  Tomorrow morning,   R        01/31/23 1722   02/01/23 0500  CBC  Tomorrow morning,   R        01/31/23 1722   01/31/23 1716  Prepare RBC (crossmatch)  (Emergency Blood Administration)  Once,   R       Question Answer Comment  # of Units 1 unit   Transfusion Indications Hemoglobin < 7 gm/dL and symptomatic   Number of Units to Keep Ahead NO units ahead   If emergent release  call blood bank [facility]  [id number]      01/31/23 1715   01/31/23 1715  Type and screen [place]  Once,   STAT       Comments: [place]    01/31/23 1714   01/31/23 1517  Culture, blood (Routine x 2)  BLOOD CULTURE X 2,   R      01/31/23 1516   01/31/23 1517  Urinalysis, Routine w reflex microscopic -Urine, Clean Catch  Once,   URGENT       Question:  Specimen Source  Answer:  Urine, Clean Catch   01/31/23 1516            Vitals/Pain Today's Vitals   01/31/23 1645 01/31/23 1700 01/31/23 1715 01/31/23 1730  BP:  119/63    Pulse: (!) 103 (!) 103 (!) 120 (!) 102  Resp: (!) 27 19 17 20  $ Temp:      TempSrc:      SpO2: 100% 100% 100% 100%  Weight:      Height:        Isolation Precautions No active isolations  Medications Medications  0.9 %  sodium chloride infusion (Manually program via Guardrails IV Fluids) (has no administration in time range)  ceFEPIme (MAXIPIME) 2 g in sodium chloride 0.9 % 100 mL IVPB (has no administration in time range)  bictegravir-emtricitabine-tenofovir AF (BIKTARVY) 50-200-25 MG per tablet 1 tablet (has no administration in time range)  hydrocortisone (ANUSOL-HC) 2.5 % rectal cream 1 Application (has no administration in time range)  0.9 %  sodium chloride infusion (has no administration in time range)  acetaminophen (TYLENOL) tablet 650 mg (has no administration in time range)    Or  acetaminophen (TYLENOL) suppository 650 mg (has no administration in time range)  docusate sodium (COLACE) capsule 100 mg (has no administration in time range)  senna-docusate (Senokot-S) tablet 1 tablet (has no administration in time range)  traZODone (DESYREL) tablet 25 mg (has no administration in time range)  ondansetron (ZOFRAN) tablet 4 mg (has no administration in time range)    Or  ondansetron (ZOFRAN) injection 4 mg (has no administration in time range)  acetaminophen (TYLENOL) tablet 1,000 mg (1,000 mg Oral Given 01/31/23 1622)   ceFEPIme (MAXIPIME) 2 g in sodium chloride 0.9 % 100 mL IVPB (0 g Intravenous Stopped 01/31/23 1655)  lactated ringers bolus 1,000 mL (1,000 mLs Intravenous New Bag/Given 01/31/23 1621)    Mobility walks     Focused Assessments    R Recommendations: See Admitting Provider Note  Report given to:   Additional Notes:

## 2023-01-31 NOTE — Telephone Encounter (Signed)
CRITICAL VALUE STICKER  CRITICAL VALUE: WBC=0.5 ANC=0.0, HGB 7.6  RECEIVER (on-site recipient of call):Yolande Skoda  DATE & TIME NOTIFIED: 01/31/23@ 1338  MESSENGER (representative from lab):   MD NOTIFIED: Dr. Irene Limbo  TIME OF NOTIFICATION:1339  RESPONSE: Dr. Irene Limbo  is aware of results. Blood transfusion scheduled for 02/01/23 Pt received udenyca on 01/27/23

## 2023-01-31 NOTE — ED Notes (Signed)
WBC-0.4, MD NOTIFIED

## 2023-01-31 NOTE — ED Triage Notes (Signed)
Pt coming from ca center w/ c/o fever of 103.1. Pt hx lymphoma and HIV. Supposed to get blood transfusion tomorrow. Hgb 7.2.

## 2023-01-31 NOTE — ED Notes (Signed)
Pt. States that he is unable to give a sample at this time.

## 2023-02-01 ENCOUNTER — Inpatient Hospital Stay: Payer: 59

## 2023-02-01 DIAGNOSIS — R5081 Fever presenting with conditions classified elsewhere: Secondary | ICD-10-CM | POA: Diagnosis not present

## 2023-02-01 DIAGNOSIS — D709 Neutropenia, unspecified: Secondary | ICD-10-CM | POA: Diagnosis not present

## 2023-02-01 DIAGNOSIS — D696 Thrombocytopenia, unspecified: Secondary | ICD-10-CM

## 2023-02-01 LAB — CBC
HCT: 26.5 % — ABNORMAL LOW (ref 39.0–52.0)
Hemoglobin: 8.6 g/dL — ABNORMAL LOW (ref 13.0–17.0)
MCH: 27.9 pg (ref 26.0–34.0)
MCHC: 32.5 g/dL (ref 30.0–36.0)
MCV: 86 fL (ref 80.0–100.0)
Platelets: 43 10*3/uL — ABNORMAL LOW (ref 150–400)
RBC: 3.08 MIL/uL — ABNORMAL LOW (ref 4.22–5.81)
RDW: 16.4 % — ABNORMAL HIGH (ref 11.5–15.5)
WBC: 0.4 10*3/uL — CL (ref 4.0–10.5)
nRBC: 5.7 % — ABNORMAL HIGH (ref 0.0–0.2)

## 2023-02-01 LAB — CBC WITH DIFFERENTIAL/PLATELET
Abs Immature Granulocytes: 0 10*3/uL (ref 0.00–0.07)
Basophils Absolute: 0 10*3/uL (ref 0.0–0.1)
Basophils Relative: 0 %
Eosinophils Absolute: 0 10*3/uL (ref 0.0–0.5)
Eosinophils Relative: 0 %
HCT: 26.6 % — ABNORMAL LOW (ref 39.0–52.0)
Hemoglobin: 8.5 g/dL — ABNORMAL LOW (ref 13.0–17.0)
Immature Granulocytes: 0 %
Lymphocytes Relative: 52 %
Lymphs Abs: 0.2 10*3/uL — ABNORMAL LOW (ref 0.7–4.0)
MCH: 27.7 pg (ref 26.0–34.0)
MCHC: 32 g/dL (ref 30.0–36.0)
MCV: 86.6 fL (ref 80.0–100.0)
Monocytes Absolute: 0.2 10*3/uL (ref 0.1–1.0)
Monocytes Relative: 43 %
Neutro Abs: 0 10*3/uL — CL (ref 1.7–7.7)
Neutrophils Relative %: 5 %
Platelets: 43 10*3/uL — ABNORMAL LOW (ref 150–400)
RBC: 3.07 MIL/uL — ABNORMAL LOW (ref 4.22–5.81)
RDW: 16.8 % — ABNORMAL HIGH (ref 11.5–15.5)
WBC: 0.4 10*3/uL — CL (ref 4.0–10.5)
nRBC: 5.4 % — ABNORMAL HIGH (ref 0.0–0.2)

## 2023-02-01 LAB — BASIC METABOLIC PANEL
Anion gap: 12 (ref 5–15)
BUN: 11 mg/dL (ref 6–20)
CO2: 24 mmol/L (ref 22–32)
Calcium: 9.4 mg/dL (ref 8.9–10.3)
Chloride: 99 mmol/L (ref 98–111)
Creatinine, Ser: 0.87 mg/dL (ref 0.61–1.24)
GFR, Estimated: >60 mL/min (ref >60)
Glucose, Bld: 156 mg/dL — ABNORMAL HIGH (ref 70–99)
Potassium: 4.6 mmol/L (ref 3.5–5.1)
Sodium: 135 mmol/L (ref 135–145)

## 2023-02-01 MED ORDER — ORAL CARE MOUTH RINSE: 15.0000 mL | OROMUCOSAL | Status: DC | PRN

## 2023-02-01 MED ORDER — CHLORHEXIDINE GLUCONATE CLOTH 2 % EX PADS
6.0000 | MEDICATED_PAD | Freq: Every day | CUTANEOUS | Status: DC
  Administered 2023-02-03: 6 via TOPICAL

## 2023-02-01 NOTE — Progress Notes (Signed)
PROGRESS NOTE    Logan Ward  [id number] DOB: 07-Oct-1981 DOA: 01/31/2023 PCP: Patient, No Pcp Per   Brief Narrative:  Logan Ward is a 42 y.o. 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     ED Course: In the emergency department, he was found to have a fever of 103.1, heart rate 127, blood pressure 110/56.  Saturating 100% on room air.  Lab work confirms pancytopenia, with WBC 0.4, hemoglobin 7, platelets 44,000.  Sodium is 127, potassium 4.0, normal renal function.  Lactate 1.7.  X-ray is unremarkable, urinalysis was ordered and pending.   Assessment & Plan:  Chemotherapy-induced neutropenia: Neutropenic fever -Patient admitted in the hospital for severe neutropenia with fever despite using prophylactic Bactrim and Augmentin outpatient. -Recently completed chemo for Burkitt's lymphoma -UA negative for infection.  COVID, flu, RSV, strep all negative.  Lactic acid: WNL.  Chest x-ray negative for any acute findings.  Blood culture negative till date. -Continue on empiric antibiotics cefepime and vancomycin -Tmax of 102.  Absolute neutrophil: 0 -Has already received Udenyca on 01/27/2023 -Appreciated oncology help  Pancytopenia: -WBC: 0.4, H&H: 8.5/26.6, platelet: 43 -Transfuse PRBC and platelets as needed -Monitor CBC closely  Hyponatremia: Resolved  HIV/AIDS: Diagnosed about 4 months ago. -Followed by ID and currently taking Biktarvy  Remote history of syphilis: -Patient report completed treatment  DVT prophylaxis: SCD, current anticoagulation in the setting of thrombocytopenia Code Status: Full code Family Communication:  None present at bedside.  Plan of care discussed with patient in length and he verbalized understanding and agreed with it. Disposition Plan: To be  determined  Consultants:  Oncology  Procedures:  None  Antimicrobials:  Cefepime Vancomycin  Status is: Inpatient    Subjective: She is seen and examined.  Resting comfortably on the bed no complaints.  Tmax 102.  Wanted to go home [address].  We discussed about risk of leaving hospital with high-grade fever and low white blood cell.  Tells me that he he is aware of that and still walk out of the hospital today.  objective: Vitals:   02/01/23 0120 02/01/23 0121 02/01/23 0351 02/01/23 0758  BP: (!) 101/57 (!) 101/57 120/67 112/63  Pulse: 79 79 80 81  Resp: 16 16 16 16  $ Temp: 100.2 F (37.9 C) 100.2 F (37.9 C) 98.8 F (37.1 C) 98.5 F (36.9 C)  TempSrc: Oral Oral Oral Oral  SpO2:  99% 98% 100%  Weight:      Height:        Intake/Output Summary (Last 24 hours) at 02/01/2023 1425 Last data filed at 02/01/2023 0730 Gross per 24 hour  Intake 414.25 ml  Output 825 ml  Net -410.75 ml   Filed Weights   01/31/23 1518  Weight: 125 kg    Examination:  General exam: Appears calm and comfortable, on room air, communicating well, appears pale Respiratory system: Clear to auscultation. Respiratory effort normal. Cardiovascular system: S1 & S2 heard, RRR. No JVD, murmurs, rubs, gallops or clicks. No pedal edema. Gastrointestinal system: Abdomen is nondistended, soft and nontender. No organomegaly or masses felt. Normal bowel sounds heard. Central nervous system: Alert and oriented. No focal neurological deficits. Extremities: Symmetric 5 x 5 power. Skin: No rashes, lesions or ulcers Psychiatry: Judgement and insight appear normal. Mood & affect appropriate.    Data Reviewed: I have personally reviewed  following labs and imaging studies  CBC: Recent Labs  Lab 01/27/23 0926 01/31/23 1259 01/31/23 1521 02/01/23 0500  WBC 1.0* 0.5* 0.4* 0.4*  0.4*  NEUTROABS 0.7* 0.0* 0.0* 0.0*  HGB 8.0* 7.6* 7.0* 8.5*  8.6*  HCT 24.3* 23.2* 22.0* 26.6*  26.5*  MCV  85.0 84.7 87.6 86.6  86.0  PLT 216 53* 44* 43*  43*   Basic Metabolic Panel: Recent Labs  Lab 01/27/23 0926 01/31/23 1259 01/31/23 1521 02/01/23 0500  NA 136 134* 127* 135  K 3.3* 4.1 4.0 4.6  CL 103 99 96* 99  CO2 27 27 23 24  $ GLUCOSE 145* 105* 118* 156*  BUN 13 8 10 11  $ CREATININE 0.82 0.77 0.81 0.87  CALCIUM 9.0 9.3 8.5* 9.4   GFR: Estimated Creatinine Clearance: 157.9 mL/min (by C-G formula based on SCr of 0.87 mg/dL). Liver Function Tests: Recent Labs  Lab 01/27/23 0926 01/31/23 1259 01/31/23 1521  AST 11* 8* 15  ALT 16 17 20  $ ALKPHOS 48 49 43  BILITOT 1.1 0.9 1.1  PROT 6.4* 6.7 6.7  ALBUMIN 4.0 4.1 3.8   No results for input(s): "LIPASE", "AMYLASE" in the last 168 hours. No results for input(s): "AMMONIA" in the last 168 hours. Coagulation Profile: Recent Labs  Lab 01/31/23 1521  INR 1.2   Cardiac Enzymes: No results for input(s): "CKTOTAL", "CKMB", "CKMBINDEX", "TROPONINI" in the last 168 hours. BNP (last 3 results) No results for input(s): "PROBNP" in the last 8760 hours. HbA1C: No results for input(s): "HGBA1C" in the last 72 hours. CBG: No results for input(s): "GLUCAP" in the last 168 hours. Lipid Profile: No results for input(s): "CHOL", "HDL", "LDLCALC", "TRIG", "CHOLHDL", "LDLDIRECT" in the last 72 hours. Thyroid Function Tests: No results for input(s): "TSH", "T4TOTAL", "FREET4", "T3FREE", "THYROIDAB" in the last 72 hours. Anemia Panel: No results for input(s): "VITAMINB12", "FOLATE", "FERRITIN", "TIBC", "IRON", "RETICCTPCT" in the last 72 hours. Sepsis Labs: Recent Labs  Lab 01/31/23 1522  LATICACIDVEN 1.7    Recent Results (from the past 240 hour(s))  Culture, blood (Routine x 2)     Status: None (Preliminary result)   Collection Time: 01/31/23  3:23 PM   Specimen: BLOOD  Result Value Ref Range Status   Specimen Description   Final    BLOOD RIGHT ANTECUBITAL Performed at [facility] [address]  [place], [place] [address]    Special Requests   Final    BOTTLES DRAWN AEROBIC ONLY Blood Culture adequate volume Performed at [facility] [address] [place], [place] [address]    Culture   Final    NO GROWTH < 24 HOURS Performed at [facility] [address] [place], [place] [address]    Report Status PENDING  Incomplete  Culture, blood (Routine x 2)     Status: None (Preliminary result)   Collection Time: 01/31/23  4:25 PM   Specimen: BLOOD  Result Value Ref Range Status   Specimen Description   Final    BLOOD RIGHT ANTECUBITAL Performed at [facility] [address] [place], [place] [address]    Special Requests   Final    BOTTLES DRAWN AEROBIC AND ANAEROBIC Blood Culture results may not be optimal due to an inadequate volume of blood received in culture bottles Performed at [facility] [address], [place], [place] [address]    Culture   Final    NO GROWTH < 24 HOURS Performed at [facility], [place] [address], [place], [place] [address]    Report Status PENDING  Incomplete  Group A Strep by PCR     Status: None   Collection Time: 01/31/23  4:25 PM   Specimen: Sterile Swab  Result Value Ref Range Status   Group A Strep by PCR NOT DETECTED NOT DETECTED Final    Comment: Performed at [facility], [place] [address], [place], [place] [address]  Resp panel by RT-PCR (RSV, Flu A&B, Covid)     Status: None   Collection Time: 01/31/23  4:25 PM   Specimen: Nasal Swab  Result Value Ref Range Status   SARS Coronavirus 2 by RT PCR NEGATIVE NEGATIVE Final    Comment: (NOTE) SARS-CoV-2 target nucleic acids are NOT DETECTED.  The SARS-CoV-2 RNA is generally detectable in upper respiratory specimens during the acute phase of infection. The lowest concentration of SARS-CoV-2 viral copies this assay can detect is 138 copies/mL. A negative result does not preclude SARS-Cov-2 infection and  should not be used as the sole basis for treatment or other patient management decisions. A negative result may occur with  improper specimen collection/handling, submission of specimen other than nasopharyngeal swab, presence of viral mutation(s) within the areas targeted by this assay, and inadequate number of viral copies(<138 copies/mL). A negative result must be combined with clinical observations, patient history, and epidemiological information. The expected result is Negative.  Fact Sheet for Patients:  EntrepreneurPulse.com.au  Fact Sheet for Healthcare Providers:  IncredibleEmployment.be  This test is no t yet approved or cleared by the [place] FDA and  has been authorized for detection and/or diagnosis of SARS-CoV-2 by FDA under an Emergency Use Authorization (EUA). This EUA will remain  in effect (meaning this test can be used) for the duration of the COVID-19 declaration under Section 564(b)(1) of the Act, 21 U.S.C.section 360bbb-3(b)(1), unless the authorization is terminated  or revoked sooner.       Influenza A by PCR NEGATIVE NEGATIVE Final   Influenza B by PCR NEGATIVE NEGATIVE Final    Comment: (NOTE) The Xpert Xpress SARS-CoV-2/FLU/RSV plus assay is intended as an aid in the diagnosis of influenza from Nasopharyngeal swab specimens and should not be used as a sole basis for treatment. Nasal washings and aspirates are unacceptable for Xpert Xpress SARS-CoV-2/FLU/RSV testing.  Fact Sheet for Patients: EntrepreneurPulse.com.au  Fact Sheet for Healthcare Providers: IncredibleEmployment.be  This test is not yet approved or cleared by the [place] FDA and has been authorized for detection and/or diagnosis of SARS-CoV-2 by FDA under an Emergency Use Authorization (EUA). This EUA will remain in effect (meaning this test can be used) for the duration of the COVID-19 declaration  under Section 564(b)(1) of the Act, 21 U.S.C. section 360bbb-3(b)(1), unless the authorization is terminated or revoked.     Resp Syncytial Virus by PCR NEGATIVE NEGATIVE Final    Comment: (NOTE) Fact Sheet for Patients: EntrepreneurPulse.com.au  Fact Sheet for Healthcare Providers: IncredibleEmployment.be  This test is not yet approved or cleared by the [place] FDA and has been authorized for detection and/or diagnosis of SARS-CoV-2 by FDA under an Emergency Use Authorization (EUA). This EUA will remain in effect (meaning this test can be used) for the duration of the COVID-19 declaration under Section 564(b)(1) of the Act, 21 U.S.C. section 360bbb-3(b)(1), unless the authorization is terminated or revoked.  Performed at [facility], [place] [address] [place], [place] [address]  Radiology Studies: DG Chest Port 1 View  Result Date: 01/31/2023 CLINICAL DATA:  Fevers.  Shortness of breath. EXAM: PORTABLE CHEST 1 VIEW COMPARISON:  01/10/2023 FINDINGS: There is a left chest wall port a catheter with tip at the cavoatrial junction. Heart size appears within normal limits. Lung volumes are low. There is no pleural effusion or edema. No airspace opacities. Osseous structures appear intact. IMPRESSION: No acute cardiopulmonary abnormalities. Electronically Signed   By: Kerby Moors M.D.   On: 01/31/2023 15:34    Scheduled Meds:  sodium chloride   Intravenous Once   bictegravir-emtricitabine-tenofovir AF  1 tablet Oral Daily   Chlorhexidine Gluconate Cloth  6 each Topical Daily   docusate sodium  100 mg Oral BID   sodium bicarbonate/sodium chloride   Mouth Rinse QID   Continuous Infusions:  sodium chloride 50 mL/hr at 01/31/23 2035   ceFEPime (MAXIPIME) IV 2 g (02/01/23 0809)     LOS: 1 day   Time spent: 35 minutes   Coretta Leisey Loann Quill, MD [facility]  If 7PM-7AM, please contact  night-coverage [email or web address] 02/01/2023, 2:25 PM

## 2023-02-01 NOTE — Progress Notes (Incomplete)
[name]  HEMATOLOGY/ONCOLOGY INPATIENT PROGRESS NOTE  Date of Service: 01/31/2023  Inpatient Attending: .Lucillie Garfinkel, MD   SUBJECTIVE  Patient seen in medical oncology evaluation.     OBJECTIVE:  ***  PHYSICAL EXAMINATION: . Vitals:   01/31/23 1700 01/31/23 1715 01/31/23 1730 01/31/23 1929  BP: 119/63   (!) 124/57  Pulse: (!) 103 (!) 120 (!) 102 (!) 102  Resp: 19 17 20 18  $ Temp:    99.7 F (37.6 C)  TempSrc:    Oral  SpO2: 100% 100% 100% 100%  Weight:      Height:       Filed Weights   01/31/23 1518  Weight: 275 lb 9.2 oz (125 kg)   .Body mass index is 36.36 kg/m.  GENERAL:alert, in no acute distress and comfortable SKIN: skin color, texture, turgor are normal, no rashes or significant lesions EYES: normal, conjunctiva are pink and non-injected, sclera clear OROPHARYNX:no exudate, no erythema and lips, buccal mucosa, and tongue normal  NECK: supple, no JVD, thyroid normal size, non-tender, without nodularity LYMPH:  no palpable lymphadenopathy in the cervical, axillary or inguinal LUNGS: clear to auscultation with normal respiratory effort HEART: regular rate & rhythm,  no murmurs and no lower extremity edema ABDOMEN: abdomen soft, non-tender, normoactive bowel sounds  Musculoskeletal: no cyanosis of digits and no clubbing  PSYCH: alert & oriented x 3 with fluent speech NEURO: no focal motor/sensory deficits  MEDICAL HISTORY:  Past Medical History:  Diagnosis Date  . Anal fissure   . HIV infection ([place])   . Internal hemorrhoids   . Obesity   . Rectal ulcer     SURGICAL HISTORY: Past Surgical History:  Procedure Laterality Date  . IR IMAGING GUIDED PORT INSERTION  11/18/2022    SOCIAL HISTORY: Social History   Socioeconomic History  . Marital status: Single    Spouse name: Not on file  . Number of children: Not on file  . Years of education: Not on file  . Highest education level: Not on file  Occupational History  . Not on file  Tobacco  Use  . Smoking status: Never  . Smokeless tobacco: Never  Vaping Use  . Vaping Use: Never used  Substance and Sexual Activity  . Alcohol use: No  . Drug use: No  . Sexual activity: Yes    Comment: declined condoms  Other Topics Concern  . Not on file  Social History Narrative  . Not on file   Social Determinants of Health   Financial Resource Strain: Low Risk  (10/24/2022)   Overall Financial Resource Strain (CARDIA)   . Difficulty of Paying Living Expenses: Not hard at all  Food Insecurity: No Food Insecurity (01/20/2023)   Hunger Vital Sign   . Worried About Charity fundraiser in the Last Year: Never true   . Ran Out of Food in the Last Year: Never true  Transportation Needs: No Transportation Needs (01/20/2023)   PRAPARE - Transportation   . Lack of Transportation (Medical): No   . Lack of Transportation (Non-Medical): No  Physical Activity: Sufficiently Active (10/24/2022)   Exercise Vital Sign   . Days of Exercise per Week: 3 days   . Minutes of Exercise per Session: 60 min  Recent Concern: Physical Activity - Insufficiently Active (10/24/2022)   Exercise Vital Sign   . Days of Exercise per Week: 3 days   . Minutes of Exercise per Session: 20 min  Stress: No Stress Concern Present (10/24/2022)   [place]  [name]   . Feeling of Stress : Not at all  Social Connections: Unknown (10/24/2022)   Social Connection and Isolation Panel [NHANES]   . Frequency of Communication with Friends and Family: Twice a week   . Frequency of Social Gatherings with Friends and Family: Twice a week   . Attends Religious Services: 1 to 4 times per year   . Active Member of Clubs or Organizations: No   . Attends Archivist Meetings: 1 to 4 times per year   . Marital Status: Patient refused  Intimate Partner Violence: Not At Risk (01/20/2023)   Humiliation, Afraid, Rape, and Kick questionnaire   . Fear of Current or Ex-Partner: No    . Emotionally Abused: No   . Physically Abused: No   . Sexually Abused: No    FAMILY HISTORY: Family History  Problem Relation Age of Onset  . Hypertension Father   . Throat cancer Father   . Heart attack Brother   . Colon cancer Neg Hx   . Esophageal cancer Neg Hx   . Stomach cancer Neg Hx     ALLERGIES:  has No Known Allergies.  MEDICATIONS:  Scheduled Meds: . sodium chloride   Intravenous Once  . bictegravir-emtricitabine-tenofovir AF  1 tablet Oral Daily  . docusate sodium  100 mg Oral BID  . hydrocortisone  1 Application Rectal TID  . sodium bicarbonate/sodium chloride   Mouth Rinse QID   Continuous Infusions: . sodium chloride    . [START ON 02/01/2023] ceFEPime (MAXIPIME) IV     PRN Meds:.acetaminophen **OR** [DISCONTINUED] acetaminophen, acetaminophen, methylPREDNISolone (SOLU-MEDROL) injection, ondansetron **OR** ondansetron (ZOFRAN) IV, senna-docusate, traZODone  REVIEW OF SYSTEMS:    10 Point review of Systems was done is negative except as noted above.   LABORATORY DATA:  I have reviewed the data as listed  .    Latest Ref Rng & Units 01/31/2023    3:21 PM 01/31/2023   12:59 PM 01/27/2023    9:26 AM  CBC  WBC 4.0 - 10.5 K/uL 0.4  0.5  1.0   Hemoglobin 13.0 - 17.0 g/dL 7.0  7.6  8.0   Hematocrit 39.0 - 52.0 % 22.0  23.2  24.3   Platelets 150 - 400 K/uL 44  53  216     .    Latest Ref Rng & Units 01/31/2023    3:21 PM 01/31/2023   12:59 PM 01/27/2023    9:26 AM  CMP  Glucose 70 - 99 mg/dL 118  105  145   BUN 6 - 20 mg/dL 10  8  13   $ Creatinine 0.61 - 1.24 mg/dL 0.81  0.77  0.82   Sodium 135 - 145 mmol/L 127  134  136   Potassium 3.5 - 5.1 mmol/L 4.0  4.1  3.3   Chloride 98 - 111 mmol/L 96  99  103   CO2 22 - 32 mmol/L 23  27  27   $ Calcium 8.9 - 10.3 mg/dL 8.5  9.3  9.0   Total Protein 6.5 - 8.1 g/dL 6.7  6.7  6.4   Total Bilirubin 0.3 - 1.2 mg/dL 1.1  0.9  1.1   Alkaline Phos 38 - 126 U/L 43  49  48   AST 15 - 41 U/L 15  8  11   $ ALT 0 - 44  U/L 20  17  16      $ RADIOGRAPHIC STUDIES: I have personally reviewed  the radiological images as listed and agreed with the findings in the report. DG Chest Port 1 View  Result Date: 01/31/2023 CLINICAL DATA:  Fevers.  Shortness of breath. EXAM: PORTABLE CHEST 1 VIEW COMPARISON:  01/10/2023 FINDINGS: There is a left chest wall port a catheter with tip at the cavoatrial junction. Heart size appears within normal limits. Lung volumes are low. There is no pleural effusion or edema. No airspace opacities. Osseous structures appear intact. IMPRESSION: No acute cardiopulmonary abnormalities. Electronically Signed   By: Kerby Moors M.D.   On: 01/31/2023 15:34   NM PET Image Restag (PS) Skull Base To Thigh  Result Date: 01/18/2023 CLINICAL DATA:  Subsequent treatment strategy for Burkitt's lymphoma. Status post 3 cycles of chemo immunotherapy. EXAM: NUCLEAR MEDICINE PET SKULL BASE TO THIGH TECHNIQUE: 13.68 mCi F-18 FDG was injected intravenously. Full-ring PET imaging was performed from the skull base to thigh after the radiotracer. CT data was obtained and used for attenuation correction and anatomic localization. Fasting blood glucose: 96 mg/dl COMPARISON:  PET-CT 11/19/2022 FINDINGS: Mediastinal blood pool activity: SUV max 2.06 Liver activity: SUV max 4.13 NECK: No residual hypermetabolic adenopathy. The large bulk E right neck mass extending into the right subclavicular area has near completely resolved. Residual subcutaneous disease anterior to the right sternocleidomastoid muscle is noted. This measures 2.5 x 2.4 cm and previously measured 5 x 5 cm. SUV max is 3.12 and was previously 33.13. Incidental CT findings: None. CHEST: The substernal/upper right anterior mediastinal disease has resolved. No residual measurable tumor or hypermetabolism. The right axillary adenopathy the has resolved. No residual measurable disease or hypermetabolism. Incidental CT findings: Left IJ power port in good position  without complicating features. No enlarged mediastinal or hilar lymph nodes. No worrisome pulmonary findings. ABDOMEN/PELVIS: No abnormal hypermetabolic activity within the liver, pancreas, adrenal glands, or spleen. No hypermetabolic lymph nodes in the abdomen or pelvis. Incidental CT findings: Thick wall bladder with pericystic interstitial/inflammatory changes could suggest cystitis. Recommend clinical correlation. SKELETON: Diffuse marrow FDG uptake likely related to chemotherapy or marrow stimulating drugs. Incidental CT findings: None. IMPRESSION: 1. PET-CT findings suggest an excellent response to treatment ([place] 1). The large markedly hypermetabolic right nodal neck mass extending into the thoracic inlet and upper right anterior mediastinum has largely resolved. Only a small area of residual disease in the subcutaneous fat overlying the right sternocleidomastoid muscle remains ([place] 3). 2. No disease below the diaphragm. 3. Mild diffuse marrow uptake likely due to chemotherapy or marrow stimulating drugs. 4. Thick wall bladder with pericystic interstitial/inflammatory changes could suggest cystitis. Electronically Signed   By: Marijo Sanes M.D.   On: 01/18/2023 09:00   DG Chest 2 View  Result Date: 01/10/2023 CLINICAL DATA:  Shortness of breath and fever EXAM: CHEST - 2 VIEW COMPARISON:  Chest x-ray and CT 11/10/2022. FINDINGS: Left IJ chest port with tip at the SVC right atrial junction. Underinflation. Developing mild opacity at the left lung base. Slight asymmetric vascular congestion at the left hilum. No pneumothorax or effusion. There is large mint of the mediastinum particularly along the aortopulmonary window. Please correlate for any known history including abnormal nodes as seen on CT scan of 11/10/2022. This is more prominent than the prior x-ray IMPRESSION: Chest port. Underinflation with developing opacity left lung base. Possible infiltrate versus atelectasis. Recommend follow-up  Widened mediastinum with fullness in the aortopulmonary window. Please correlate for known history of abnormal lymph nodes Electronically Signed   By: Jill Side M.D.   On:  01/10/2023 18:26    ASSESSMENT & PLAN:  ***  I spent {CHL ONC TIME VISIT - [id number] counseling the patient face to face. The total time spent in the appointment was {CHL ONC TIME VISIT - [id number] and more than 50% was on counseling and direct patient cares.    Sullivan Lone MD [place] AAHIVMS [facility] Hematology/Oncology Physician [facility]  (Office):       [phone] (Work cell):  [phone] (Fax):           [phone]  01/31/2023 8:02 PM

## 2023-02-02 ENCOUNTER — Encounter: Payer: Self-pay | Admitting: Hematology

## 2023-02-02 DIAGNOSIS — D709 Neutropenia, unspecified: Secondary | ICD-10-CM | POA: Diagnosis not present

## 2023-02-02 DIAGNOSIS — R5081 Fever presenting with conditions classified elsewhere: Secondary | ICD-10-CM | POA: Diagnosis not present

## 2023-02-02 LAB — COMPREHENSIVE METABOLIC PANEL
ALT: 28 U/L (ref 0–44)
AST: 23 U/L (ref 15–41)
Albumin: 3.1 g/dL — ABNORMAL LOW (ref 3.5–5.0)
Alkaline Phosphatase: 34 U/L — ABNORMAL LOW (ref 38–126)
Anion gap: 10 (ref 5–15)
BUN: 11 mg/dL (ref 6–20)
CO2: 22 mmol/L (ref 22–32)
Calcium: 8.7 mg/dL — ABNORMAL LOW (ref 8.9–10.3)
Chloride: 103 mmol/L (ref 98–111)
Creatinine, Ser: 1.12 mg/dL (ref 0.61–1.24)
GFR, Estimated: >60 mL/min (ref >60)
Glucose, Bld: 130 mg/dL — ABNORMAL HIGH (ref 70–99)
Potassium: 3.7 mmol/L (ref 3.5–5.1)
Sodium: 135 mmol/L (ref 135–145)
Total Bilirubin: 0.8 mg/dL (ref 0.3–1.2)
Total Protein: 6 g/dL — ABNORMAL LOW (ref 6.5–8.1)

## 2023-02-02 LAB — CBC
HCT: 23.4 % — ABNORMAL LOW (ref 39.0–52.0)
Hemoglobin: 7.4 g/dL — ABNORMAL LOW (ref 13.0–17.0)
MCH: 27.7 pg (ref 26.0–34.0)
MCHC: 31.6 g/dL (ref 30.0–36.0)
MCV: 87.6 fL (ref 80.0–100.0)
Platelets: 38 10*3/uL — ABNORMAL LOW (ref 150–400)
RBC: 2.67 MIL/uL — ABNORMAL LOW (ref 4.22–5.81)
RDW: 17.2 % — ABNORMAL HIGH (ref 11.5–15.5)
WBC: 1.4 10*3/uL — CL (ref 4.0–10.5)
nRBC: 26.5 % — ABNORMAL HIGH (ref 0.0–0.2)

## 2023-02-02 NOTE — Progress Notes (Signed)
PROGRESS NOTE    Logan Ward  [id number] DOB: 05/06/1981 DOA: 01/31/2023 PCP: Patient, No Pcp Per   Brief Narrative:  Logan Ward is a 42 y.o. 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     ED Course: In the emergency department, he was found to have a fever of 103.1, heart rate 127, blood pressure 110/56.  Saturating 100% on room air.  Lab work confirms pancytopenia, with WBC 0.4, hemoglobin 7, platelets 44,000.  Sodium is 127, potassium 4.0, normal renal function.  Lactate 1.7.  X-ray is unremarkable, urinalysis was ordered and pending.   Assessment & Plan:  Chemotherapy-induced neutropenia: Neutropenic fever -Patient admitted in the hospital for severe neutropenia with fever despite using prophylactic Bactrim and Augmentin outpatient. -Recently received chemo for Burkitt's lymphoma -UA negative for infection.  COVID, flu, RSV, strep all negative.  Lactic acid: WNL.  Chest x-ray negative for any acute findings.  Blood culture negative till date. -Continue on empiric antibiotics cefepime and vancomycin -Tmax of 100.2.  Absolute neutrophil: 0 -Has already received Udenyca on 01/27/2023-hopefully his WBCs started to normalize soon -Appreciated oncology help  Pancytopenia: -WBC: 0.4-->1.4, H&H: 8.5/26.6--> 7.4/23.4, platelet: 43--> 38 -Transfuse PRBC and platelets as needed -Monitor CBC closely  Hyponatremia: Resolved  HIV/AIDS: Diagnosed about 4 months ago. -Followed by ID and currently taking Biktarvy  Remote history of syphilis: -Patient report completed treatment  DVT prophylaxis: SCD, not on current anticoagulation in the setting of thrombocytopenia Code Status: Full code Family Communication:  None present at bedside.  Plan of care discussed with patient in length and he verbalized  understanding and agreed with it. Disposition Plan: To be determined  Consultants:  Oncology  Procedures:  None  Antimicrobials:  Cefepime Vancomycin  Status is: Inpatient    Subjective: Patient seen and examined.  Resting comfortably on the bed.  Denies any new complaints.  Tmax: 100.2.  No nausea, vomiting, diarrhea, abdominal pain, cough, congestion.  Reports that he was unable to sleep at night for unknown reason.   objective: Vitals:   02/01/23 1846 02/01/23 1939 02/01/23 2344 02/02/23 0523  BP: 130/75 126/64 122/76 (!) 113/57  Pulse: 91 (!) 101 (!) 115 (!) 104  Resp: 18 16 17 16  $ Temp: 98.6 F (37 C) 98.6 F (37 C) 99.5 F (37.5 C) 98.8 F (37.1 C)  TempSrc: Oral Oral Oral Oral  SpO2: 99% 100% 100% 99%  Weight:      Height:        Intake/Output Summary (Last 24 hours) at 02/02/2023 0934 Last data filed at 02/02/2023 0900 Gross per 24 hour  Intake --  Output 400 ml  Net -400 ml    Filed Weights   01/31/23 1518  Weight: 125 kg    Examination:  General exam: Appears calm and comfortable, on room air, communicating well, appears pale Respiratory system: Clear to auscultation. Respiratory effort normal. Cardiovascular system: S1 & S2 heard, RRR. No JVD, murmurs, rubs, gallops or clicks. No pedal edema. Gastrointestinal system: Abdomen is nondistended, soft and nontender. No organomegaly or masses felt. Normal bowel sounds heard. Central nervous system: Alert and oriented. No focal neurological deficits. Extremities: Symmetric 5 x 5 power. Skin: No rashes, lesions or ulcers Psychiatry: Judgement and insight appear normal. Mood & affect appropriate.    Data Reviewed: I have personally reviewed following labs and imaging studies  CBC: Recent Labs  Lab 01/27/23 0926 01/31/23 1259 01/31/23 1521 02/01/23 0500 02/02/23 0528  WBC 1.0* 0.5* 0.4* 0.4*  0.4* 1.4*  NEUTROABS 0.7* 0.0* 0.0* 0.0*  --   HGB 8.0* 7.6* 7.0* 8.5*  8.6* 7.4*  HCT 24.3* 23.2*  22.0* 26.6*  26.5* 23.4*  MCV 85.0 84.7 87.6 86.6  86.0 87.6  PLT 216 53* 44* 43*  43* 38*    Basic Metabolic Panel: Recent Labs  Lab 01/27/23 0926 01/31/23 1259 01/31/23 1521 02/01/23 0500 02/02/23 0528  NA 136 134* 127* 135 135  K 3.3* 4.1 4.0 4.6 3.7  CL 103 99 96* 99 103  CO2 27 27 23 24 22  $ GLUCOSE 145* 105* 118* 156* 130*  BUN 13 8 10 11 11  $ CREATININE 0.82 0.77 0.81 0.87 1.12  CALCIUM 9.0 9.3 8.5* 9.4 8.7*    GFR: Estimated Creatinine Clearance: 122.6 mL/min (by C-G formula based on SCr of 1.12 mg/dL). Liver Function Tests: Recent Labs  Lab 01/27/23 0926 01/31/23 1259 01/31/23 1521 02/02/23 0528  AST 11* 8* 15 23  ALT 16 17 20 28  $ ALKPHOS 48 49 43 34*  BILITOT 1.1 0.9 1.1 0.8  PROT 6.4* 6.7 6.7 6.0*  ALBUMIN 4.0 4.1 3.8 3.1*    No results for input(s): "LIPASE", "AMYLASE" in the last 168 hours. No results for input(s): "AMMONIA" in the last 168 hours. Coagulation Profile: Recent Labs  Lab 01/31/23 1521  INR 1.2    Cardiac Enzymes: No results for input(s): "CKTOTAL", "CKMB", "CKMBINDEX", "TROPONINI" in the last 168 hours. BNP (last 3 results) No results for input(s): "PROBNP" in the last 8760 hours. HbA1C: No results for input(s): "HGBA1C" in the last 72 hours. CBG: No results for input(s): "GLUCAP" in the last 168 hours. Lipid Profile: No results for input(s): "CHOL", "HDL", "LDLCALC", "TRIG", "CHOLHDL", "LDLDIRECT" in the last 72 hours. Thyroid Function Tests: No results for input(s): "TSH", "T4TOTAL", "FREET4", "T3FREE", "THYROIDAB" in the last 72 hours. Anemia Panel: No results for input(s): "VITAMINB12", "FOLATE", "FERRITIN", "TIBC", "IRON", "RETICCTPCT" in the last 72 hours. Sepsis Labs: Recent Labs  Lab 01/31/23 1522  LATICACIDVEN 1.7     Recent Results (from the past 240 hour(s))  Culture, blood (Routine x 2)     Status: None (Preliminary result)   Collection Time: 01/31/23  3:23 PM   Specimen: BLOOD  Result Value Ref Range  Status   Specimen Description   Final    BLOOD RIGHT ANTECUBITAL Performed at [facility] [address], [place], [place] [address]    Special Requests   Final    BOTTLES DRAWN AEROBIC ONLY Blood Culture adequate volume Performed at [facility] [address], [place], [place] [address]    Culture   Final    NO GROWTH 2 DAYS Performed at [facility] [address], [place], [place] [address]    Report Status PENDING  Incomplete  Culture, blood (Routine x 2)     Status: None (Preliminary result)   Collection Time: 01/31/23  4:25 PM   Specimen: BLOOD  Result Value Ref Range Status   Specimen Description   Final    BLOOD RIGHT ANTECUBITAL Performed at [facility] [address] [place], [place] [address]    Special Requests   Final    BOTTLES DRAWN AEROBIC AND ANAEROBIC Blood Culture results may not be optimal due to an inadequate volume of blood received in culture bottles Performed at [facility], [place] [address], [place], [place] [address]    Culture   Final    NO GROWTH 2 DAYS Performed at [facility] [address], [place], [place] [address]    Report Status PENDING  Incomplete  Group A Strep by PCR     Status: None   Collection Time: 01/31/23  4:25 PM   Specimen: Sterile Swab  Result Value Ref Range Status   Group A Strep by PCR NOT DETECTED NOT DETECTED Final    Comment: Performed at [facility], [place] [address], [place], [place] [address]  Resp panel by RT-PCR (RSV, Flu A&B, Covid)     Status: None   Collection Time: 01/31/23  4:25 PM   Specimen: Nasal Swab  Result Value Ref Range Status   SARS Coronavirus 2 by RT PCR NEGATIVE NEGATIVE Final    Comment: (NOTE) SARS-CoV-2 target nucleic acids are NOT DETECTED.  The SARS-CoV-2 RNA is generally detectable in upper respiratory specimens during the acute phase of infection. The lowest concentration of  SARS-CoV-2 viral copies this assay can detect is 138 copies/mL. A negative result does not preclude SARS-Cov-2 infection and should not be used as the sole basis for treatment or other patient management decisions. A negative result may occur with  improper specimen collection/handling, submission of specimen other than nasopharyngeal swab, presence of viral mutation(s) within the areas targeted by this assay, and inadequate number of viral copies(<138 copies/mL). A negative result must be combined with clinical observations, patient history, and epidemiological information. The expected result is Negative.  Fact Sheet for Patients:  EntrepreneurPulse.com.au  Fact Sheet for Healthcare Providers:  IncredibleEmployment.be  This test is no t yet approved or cleared by the [place] FDA and  has been authorized for detection and/or diagnosis of SARS-CoV-2 by FDA under an Emergency Use Authorization (EUA). This EUA will remain  in effect (meaning this test can be used) for the duration of the COVID-19 declaration under Section 564(b)(1) of the Act, 21 U.S.C.section 360bbb-3(b)(1), unless the authorization is terminated  or revoked sooner.       Influenza A by PCR NEGATIVE NEGATIVE Final   Influenza B by PCR NEGATIVE NEGATIVE Final    Comment: (NOTE) The Xpert Xpress SARS-CoV-2/FLU/RSV plus assay is intended as an aid in the diagnosis of influenza from Nasopharyngeal swab specimens and should not be used as a sole basis for treatment. Nasal washings and aspirates are unacceptable for Xpert Xpress SARS-CoV-2/FLU/RSV testing.  Fact Sheet for Patients: EntrepreneurPulse.com.au  Fact Sheet for Healthcare Providers: IncredibleEmployment.be  This test is not yet approved or cleared by the [place] FDA and has been authorized for detection and/or diagnosis of SARS-CoV-2 by FDA under an Emergency Use  Authorization (EUA). This EUA will remain in effect (meaning this test can be used) for the duration of the COVID-19 declaration under Section 564(b)(1) of the Act, 21 U.S.C. section 360bbb-3(b)(1), unless the authorization is terminated or revoked.     Resp Syncytial Virus by PCR NEGATIVE NEGATIVE Final    Comment: (NOTE) Fact Sheet for Patients: EntrepreneurPulse.com.au  Fact Sheet for Healthcare Providers: IncredibleEmployment.be  This test is not yet approved or cleared by the [place] FDA and has been authorized for detection and/or diagnosis of SARS-CoV-2 by FDA under an Emergency Use Authorization (EUA). This EUA will remain in effect (meaning this test can be used) for the duration of the COVID-19 declaration under Section 564(b)(1) of the Act, 21 U.S.C. section 360bbb-3(b)(1), unless the  authorization is terminated or revoked.  Performed at [facility], [place] [address] [place], [place] [address]       Radiology Studies: [facility] Chest Port 1 View  Result Date: 01/31/2023 CLINICAL DATA:  Fevers.  Shortness of breath. EXAM: PORTABLE CHEST 1 VIEW COMPARISON:  01/10/2023 FINDINGS: There is a left chest wall port a catheter with tip at the cavoatrial junction. Heart size appears within normal limits. Lung volumes are low. There is no pleural effusion or edema. No airspace opacities. Osseous structures appear intact. IMPRESSION: No acute cardiopulmonary abnormalities. Electronically Signed   By: Kerby Moors M.D.   On: 01/31/2023 15:34    Scheduled Meds:  sodium chloride   Intravenous Once   bictegravir-emtricitabine-tenofovir AF  1 tablet Oral Daily   Chlorhexidine Gluconate Cloth  6 each Topical Daily   docusate sodium  100 mg Oral BID   sodium bicarbonate/sodium chloride   Mouth Rinse QID   Continuous Infusions:  sodium chloride 50 mL/hr at 01/31/23 2035   ceFEPime (MAXIPIME) IV 2 g (02/02/23 0849)     LOS: 2  days   Time spent: 35 minutes   Jaquelinne Glendening Loann Quill, MD [facility]  If 7PM-7AM, please contact night-coverage [email or web address] 02/02/2023, 9:34 AM

## 2023-02-02 NOTE — Progress Notes (Signed)
  Transition of Care [facility]) Screening Note   Patient Details  Name: Logan Ward Date of Birth: 1981-01-27   Transition of Care [facility]) CM/SW Contact:    Vassie Moselle, LCSW Phone Number: 02/02/2023, 10:53 AM    Transition of Care Department [facility]) has reviewed patient and no TOC needs have been identified at this time. We will continue to monitor patient advancement through interdisciplinary progression rounds. If new patient transition needs arise, please place a TOC consult.

## 2023-02-03 ENCOUNTER — Other Ambulatory Visit: Payer: Self-pay

## 2023-02-03 ENCOUNTER — Other Ambulatory Visit (HOSPITAL_COMMUNITY): Payer: Self-pay

## 2023-02-03 DIAGNOSIS — D709 Neutropenia, unspecified: Secondary | ICD-10-CM | POA: Diagnosis not present

## 2023-02-03 DIAGNOSIS — C8378 Burkitt lymphoma, lymph nodes of multiple sites: Secondary | ICD-10-CM

## 2023-02-03 DIAGNOSIS — R5081 Fever presenting with conditions classified elsewhere: Secondary | ICD-10-CM | POA: Diagnosis not present

## 2023-02-03 LAB — BPAM RBC
Blood Product Expiration Date: 202403102359
ISSUE DATE / TIME: 202402170058
Unit Type and Rh: 6200

## 2023-02-03 LAB — CBC WITH DIFFERENTIAL/PLATELET
Abs Immature Granulocytes: 0.68 10*3/uL — ABNORMAL HIGH (ref 0.00–0.07)
Abs Immature Granulocytes: 1.49 10*3/uL — ABNORMAL HIGH (ref 0.00–0.07)
Basophils Absolute: 0.1 10*3/uL (ref 0.0–0.1)
Basophils Absolute: 0 10*3/uL (ref 0.0–0.1)
Basophils Relative: 1 %
Basophils Relative: 0 %
Eosinophils Absolute: 0 10*3/uL (ref 0.0–0.5)
Eosinophils Absolute: 0 10*3/uL (ref 0.0–0.5)
Eosinophils Relative: 0 %
Eosinophils Relative: 0 %
HCT: 24.3 % — ABNORMAL LOW (ref 39.0–52.0)
HCT: 25 % — ABNORMAL LOW (ref 39.0–52.0)
Hemoglobin: 7.8 g/dL — ABNORMAL LOW (ref 13.0–17.0)
Hemoglobin: 7.8 g/dL — ABNORMAL LOW (ref 13.0–17.0)
Immature Granulocytes: 12 %
Immature Granulocytes: 19 %
Lymphocytes Relative: 9 %
Lymphocytes Relative: 7 %
Lymphs Abs: 0.5 10*3/uL — ABNORMAL LOW (ref 0.7–4.0)
Lymphs Abs: 0.5 10*3/uL — ABNORMAL LOW (ref 0.7–4.0)
MCH: 28.2 pg (ref 26.0–34.0)
MCH: 28.1 pg (ref 26.0–34.0)
MCHC: 32.1 g/dL (ref 30.0–36.0)
MCHC: 31.2 g/dL (ref 30.0–36.0)
MCV: 87.7 fL (ref 80.0–100.0)
MCV: 89.9 fL (ref 80.0–100.0)
Monocytes Absolute: 1.6 10*3/uL — ABNORMAL HIGH (ref 0.1–1.0)
Monocytes Absolute: 2.1 10*3/uL — ABNORMAL HIGH (ref 0.1–1.0)
Monocytes Relative: 28 %
Monocytes Relative: 27 %
Neutro Abs: 3 10*3/uL (ref 1.7–7.7)
Neutro Abs: 3.7 10*3/uL (ref 1.7–7.7)
Neutrophils Relative %: 50 %
Neutrophils Relative %: 47 %
Platelets: 61 10*3/uL — ABNORMAL LOW (ref 150–400)
Platelets: 74 10*3/uL — ABNORMAL LOW (ref 150–400)
RBC: 2.77 MIL/uL — ABNORMAL LOW (ref 4.22–5.81)
RBC: 2.78 MIL/uL — ABNORMAL LOW (ref 4.22–5.81)
RDW: 17.6 % — ABNORMAL HIGH (ref 11.5–15.5)
RDW: 17.8 % — ABNORMAL HIGH (ref 11.5–15.5)
WBC: 5.9 10*3/uL (ref 4.0–10.5)
WBC: 7.8 10*3/uL (ref 4.0–10.5)
nRBC: 5.1 % — ABNORMAL HIGH (ref 0.0–0.2)
nRBC: 3.7 % — ABNORMAL HIGH (ref 0.0–0.2)

## 2023-02-03 LAB — TYPE AND SCREEN
ABO/RH(D): AB POS
Antibody Screen: NEGATIVE
Unit division: 0

## 2023-02-03 LAB — BASIC METABOLIC PANEL
Anion gap: 12 (ref 5–15)
BUN: 7 mg/dL (ref 6–20)
CO2: 24 mmol/L (ref 22–32)
Calcium: 8.5 mg/dL — ABNORMAL LOW (ref 8.9–10.3)
Chloride: 97 mmol/L — ABNORMAL LOW (ref 98–111)
Creatinine, Ser: 0.99 mg/dL (ref 0.61–1.24)
GFR, Estimated: >60 mL/min (ref >60)
Glucose, Bld: 117 mg/dL — ABNORMAL HIGH (ref 70–99)
Potassium: 3.3 mmol/L — ABNORMAL LOW (ref 3.5–5.1)
Sodium: 133 mmol/L — ABNORMAL LOW (ref 135–145)

## 2023-02-03 LAB — MAGNESIUM: Magnesium: 2.1 mg/dL (ref 1.7–2.4)

## 2023-02-03 MED ORDER — CIPROFLOXACIN HCL 500 MG PO TABS
500.0000 mg | ORAL_TABLET | Freq: Two times a day (BID) | ORAL | 0 refills | Status: AC
  Filled 2023-02-03: qty 10, 5d supply, fill #0

## 2023-02-03 MED ORDER — METRONIDAZOLE 500 MG PO TABS
500.0000 mg | ORAL_TABLET | Freq: Two times a day (BID) | ORAL | 0 refills | Status: AC
  Filled 2023-02-03: qty 10, 5d supply, fill #0

## 2023-02-03 MED ORDER — SULFAMETHOXAZOLE-TRIMETHOPRIM 800-160 MG PO TABS
1.0000 | ORAL_TABLET | Freq: Every day | ORAL | 1 refills | Status: DC
  Filled 2023-02-03: qty 30, 30d supply, fill #0

## 2023-02-03 MED ORDER — POTASSIUM CHLORIDE CRYS ER 20 MEQ PO TBCR
40.0000 meq | EXTENDED_RELEASE_TABLET | Freq: Two times a day (BID) | ORAL | Status: DC
  Administered 2023-02-03: 40 meq via ORAL
  Filled 2023-02-03: qty 2

## 2023-02-03 MED ORDER — HEPARIN SOD (PORK) LOCK FLUSH 100 UNIT/ML IV SOLN
500.0000 [IU] | INTRAVENOUS | Status: DC | PRN
  Filled 2023-02-03: qty 5

## 2023-02-03 MED ORDER — VANCOMYCIN HCL 1500 MG/300ML IV SOLN
1500.0000 mg | Freq: Two times a day (BID) | INTRAVENOUS | Status: DC
  Filled 2023-02-03: qty 300

## 2023-02-03 MED ORDER — VANCOMYCIN HCL 2000 MG/400ML IV SOLN
2000.0000 mg | INTRAVENOUS | Status: AC
  Administered 2023-02-03: 2000 mg via INTRAVENOUS
  Filled 2023-02-03: qty 400

## 2023-02-03 NOTE — Progress Notes (Signed)
Explained to patient the importance of SCD's, Joab states he's getting up and walking and doesn't want to use SCD's.

## 2023-02-03 NOTE — Progress Notes (Signed)
Rollene Fare did this patient's discharge.

## 2023-02-03 NOTE — Progress Notes (Addendum)
PROGRESS NOTE    Logan Ward  [id number] DOB: [birth year] DOA: 01/31/2023 PCP: Patient, No Pcp Per   Brief Narrative:  Logan Ward is a 42 y.o. 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     ED Course: In the emergency department, he was found to have a fever of 103.1, heart rate 127, blood pressure 110/56.  Saturating 100% on room air.  Lab work confirms pancytopenia, with WBC 0.4, hemoglobin 7, platelets 44,000.  Sodium is 127, potassium 4.0, normal renal function.  Lactate 1.7.  X-ray is unremarkable, urinalysis was ordered and pending.   Assessment & Plan:  Chemotherapy-induced neutropenia: Neutropenic fever -Patient admitted in the hospital for severe neutropenia with fever despite using prophylactic Bactrim and Augmentin outpatient. -Recently received chemo for Burkitt's lymphoma -UA negative for infection.  COVID, flu, RSV, strep all negative.  Lactic acid: WNL.  Chest x-ray negative for any acute findings.  Blood culture negative till date.  Urine culture negative. -Continue on empiric antibiotics cefepime and vancomycin -Tmax of 102.  WBC: Back to normal.  ANC: 3000 -Has already received Udenyca on 01/27/2023 -Appreciated oncology help -needs to be fever free for more than 24 hours and ANC above 1000 for 2 days prior to the discharge.  Pancytopenia: -WBC: 0.4-->1.4--> 5.9, H&H: 8.5/26.6--> 7.4/23.4--> 7.8/24.3, platelet: 43--> 38--> 61 -Transfuse PRBC and platelets as needed -Monitor CBC closely  Hypokalemia: Replenished.  Magnesium: WNL.  Repeat BMP tomorrow a.m.  Hyponatremia: Resolved  HIV/AIDS: Diagnosed about 4 months ago. -Followed by ID and currently taking Biktarvy  Remote history of syphilis: -Patient report completed treatment  DVT prophylaxis: SCD, not on current  anticoagulation in the setting of thrombocytopenia Code Status: Full code Family Communication:  None present at bedside.  Plan of care discussed with patient in length and he verbalized understanding and agreed with it. Disposition Plan: To be determined  Consultants:  Oncology  Procedures:  None  Antimicrobials:  Cefepime Vancomycin  Status is: Inpatient    Subjective: Patient seen and examined.  Mom at the bedside.  Patient reports that he is doing fine, no new complaints.  Tmax 102 in last 24 hours however patient refused to admit that.  objective: Vitals:   02/02/23 1249 02/02/23 1606 02/02/23 1608 02/02/23 1943  BP: 109/65 112/63 112/63 114/61  Pulse: (!) 112 (!) 105 (!) 105 97  Resp: 18 20 20 17  $ Temp: 100 F (37.8 C) (!) 102 F (38.9 C) (!) 102 F (38.9 C) 99.6 F (37.6 C)  TempSrc: Oral Oral Oral Oral  SpO2: 95% 98% 98% 100%  Weight:      Height:        Intake/Output Summary (Last 24 hours) at 02/03/2023 1205 Last data filed at 02/03/2023 1100 Gross per 24 hour  Intake 50 ml  Output 875 ml  Net -825 ml    Filed Weights   01/31/23 1518  Weight: 125 kg    Examination:  General exam: Appears calm and comfortable, on room air, communicating well, appears pale, mom at the bedside Respiratory system: Clear to auscultation. Respiratory effort normal. Cardiovascular system: S1 & S2 heard, RRR. No JVD, murmurs, rubs, gallops or clicks. No pedal edema. Gastrointestinal system: Abdomen is nondistended, soft and nontender. No organomegaly or masses felt. Normal bowel sounds heard. Central nervous system: Alert and oriented. No focal neurological deficits. Extremities:  Symmetric 5 x 5 power. Skin: No rashes, lesions or ulcers Psychiatry: Judgement and insight appear normal. Mood & affect appropriate.    Data Reviewed: I have personally reviewed following labs and imaging studies  CBC: Recent Labs  Lab 01/31/23 1259 01/31/23 1521 02/01/23 0500  02/02/23 0528 02/03/23 0615  WBC 0.5* 0.4* 0.4*  0.4* 1.4* 5.9  NEUTROABS 0.0* 0.0* 0.0*  --  3.0  HGB 7.6* 7.0* 8.5*  8.6* 7.4* 7.8*  HCT 23.2* 22.0* 26.6*  26.5* 23.4* 24.3*  MCV 84.7 87.6 86.6  86.0 87.6 87.7  PLT 53* 44* 43*  43* 38* 61*    Basic Metabolic Panel: Recent Labs  Lab 01/31/23 1259 01/31/23 1521 02/01/23 0500 02/02/23 0528 02/03/23 0615  NA 134* 127* 135 135 133*  K 4.1 4.0 4.6 3.7 3.3*  CL 99 96* 99 103 97*  CO2 27 23 24 22 24  $ GLUCOSE 105* 118* 156* 130* 117*  BUN 8 10 11 11 7  $ CREATININE 0.77 0.81 0.87 1.12 0.99  CALCIUM 9.3 8.5* 9.4 8.7* 8.5*  MG  --   --   --   --  2.1    GFR: Estimated Creatinine Clearance: 138.7 mL/min (by C-G formula based on SCr of 0.99 mg/dL). Liver Function Tests: Recent Labs  Lab 01/31/23 1259 01/31/23 1521 02/02/23 0528  AST 8* 15 23  ALT 17 20 28  $ ALKPHOS 49 43 34*  BILITOT 0.9 1.1 0.8  PROT 6.7 6.7 6.0*  ALBUMIN 4.1 3.8 3.1*    No results for input(s): "LIPASE", "AMYLASE" in the last 168 hours. No results for input(s): "AMMONIA" in the last 168 hours. Coagulation Profile: Recent Labs  Lab 01/31/23 1521  INR 1.2    Cardiac Enzymes: No results for input(s): "CKTOTAL", "CKMB", "CKMBINDEX", "TROPONINI" in the last 168 hours. BNP (last 3 results) No results for input(s): "PROBNP" in the last 8760 hours. HbA1C: No results for input(s): "HGBA1C" in the last 72 hours. CBG: No results for input(s): "GLUCAP" in the last 168 hours. Lipid Profile: No results for input(s): "CHOL", "HDL", "LDLCALC", "TRIG", "CHOLHDL", "LDLDIRECT" in the last 72 hours. Thyroid Function Tests: No results for input(s): "TSH", "T4TOTAL", "FREET4", "T3FREE", "THYROIDAB" in the last 72 hours. Anemia Panel: No results for input(s): "VITAMINB12", "FOLATE", "FERRITIN", "TIBC", "IRON", "RETICCTPCT" in the last 72 hours. Sepsis Labs: Recent Labs  Lab 01/31/23 1522  LATICACIDVEN 1.7     Recent Results (from the past 240 hour(s))   Culture, blood (Routine x 2)     Status: None (Preliminary result)   Collection Time: 01/31/23  3:23 PM   Specimen: BLOOD  Result Value Ref Range Status   Specimen Description   Final    BLOOD RIGHT ANTECUBITAL Performed at [facility] [address] [place], [place] [address]    Special Requests   Final    BOTTLES DRAWN AEROBIC ONLY Blood Culture adequate volume Performed at [facility] [address] [place], [place] [address]    Culture   Final    NO GROWTH 3 DAYS Performed at [facility], [place] [address] [place], [place] [address]    Report Status PENDING  Incomplete  Culture, blood (Routine x 2)     Status: None (Preliminary result)   Collection Time: 01/31/23  4:25 PM   Specimen: BLOOD  Result Value Ref Range Status   Specimen Description   Final    BLOOD RIGHT ANTECUBITAL Performed at [facility]  [address] [place], [place] [address]    Special Requests   Final    BOTTLES DRAWN AEROBIC AND ANAEROBIC Blood Culture results may not be optimal due to an inadequate volume of blood received in culture bottles Performed at [facility] [address] [place], [place] [address]    Culture   Final    NO GROWTH 3 DAYS Performed at [facility], [place] [address], [place], [place] [address]    Report Status PENDING  Incomplete  Group A Strep by PCR     Status: None   Collection Time: 01/31/23  4:25 PM   Specimen: Sterile Swab  Result Value Ref Range Status   Group A Strep by PCR NOT DETECTED NOT DETECTED Final    Comment: Performed at [facility], [place] [address] [place],  [address]  Resp panel by RT-PCR (RSV, Flu A&B, Covid)     Status: None   Collection Time: 01/31/23  4:25 PM   Specimen: Nasal Swab  Result Value Ref Range Status   SARS Coronavirus 2 by RT PCR NEGATIVE NEGATIVE Final    Comment: (NOTE) SARS-CoV-2 target nucleic acids are NOT  DETECTED.  The SARS-CoV-2 RNA is generally detectable in upper respiratory specimens during the acute phase of infection. The lowest concentration of SARS-CoV-2 viral copies this assay can detect is 138 copies/mL. A negative result does not preclude SARS-Cov-2 infection and should not be used as the sole basis for treatment or other patient management decisions. A negative result may occur with  improper specimen collection/handling, submission of specimen other than nasopharyngeal swab, presence of viral mutation(s) within the areas targeted by this assay, and inadequate number of viral copies(<138 copies/mL). A negative result must be combined with clinical observations, patient history, and epidemiological information. The expected result is Negative.  Fact Sheet for Patients:  EntrepreneurPulse.com.au  Fact Sheet for Healthcare Providers:  IncredibleEmployment.be  This test is no t yet approved or cleared by the [place] FDA and  has been authorized for detection and/or diagnosis of SARS-CoV-2 by FDA under an Emergency Use Authorization (EUA). This EUA will remain  in effect (meaning this test can be used) for the duration of the COVID-19 declaration under Section 564(b)(1) of the Act, 21 U.S.C.section 360bbb-3(b)(1), unless the authorization is terminated  or revoked sooner.       Influenza A by PCR NEGATIVE NEGATIVE Final   Influenza B by PCR NEGATIVE NEGATIVE Final    Comment: (NOTE) The Xpert Xpress SARS-CoV-2/FLU/RSV plus assay is intended as an aid in the diagnosis of influenza from Nasopharyngeal swab specimens and should not be used as a sole basis for treatment. Nasal washings and aspirates are unacceptable for Xpert Xpress SARS-CoV-2/FLU/RSV testing.  Fact Sheet for Patients: EntrepreneurPulse.com.au  Fact Sheet for Healthcare Providers: IncredibleEmployment.be  This test is not yet  approved or cleared by the [place] FDA and has been authorized for detection and/or diagnosis of SARS-CoV-2 by FDA under an Emergency Use Authorization (EUA). This EUA will remain in effect (meaning this test can be used) for the duration of the COVID-19 declaration under Section 564(b)(1) of the Act, 21 U.S.C. section 360bbb-3(b)(1), unless the authorization is terminated or revoked.     Resp Syncytial Virus by PCR NEGATIVE NEGATIVE Final    Comment: (NOTE) Fact Sheet for Patients: EntrepreneurPulse.com.au  Fact Sheet for Healthcare Providers: IncredibleEmployment.be  This test is not yet approved or cleared by the [place] FDA and has been authorized for detection  and/or diagnosis of SARS-CoV-2 by FDA under an Emergency Use Authorization (EUA). This EUA will remain in effect (meaning this test can be used) for the duration of the COVID-19 declaration under Section 564(b)(1) of the Act, 21 U.S.C. section 360bbb-3(b)(1), unless the authorization is terminated or revoked.  Performed at [facility], [place] [address] [place], [place] [address]       Radiology Studies: No results found.  Scheduled Meds:  sodium chloride   Intravenous Once   bictegravir-emtricitabine-tenofovir AF  1 tablet Oral Daily   Chlorhexidine Gluconate Cloth  6 each Topical Daily   docusate sodium  100 mg Oral BID   potassium chloride  40 mEq Oral BID   sodium bicarbonate/sodium chloride   Mouth Rinse QID   Continuous Infusions:  ceFEPime (MAXIPIME) IV 2 g (02/03/23 0851)   vancomycin       LOS: 3 days   Time spent: 35 minutes   Felisia Balcom Loann Quill, MD [facility]  If 7PM-7AM, please contact night-coverage [email or web address] 02/03/2023, 12:05 PM

## 2023-02-03 NOTE — Discharge Summary (Signed)
Physician Discharge Summary  Logan Ward [id number] DOB: 05/22/1981 DOA: 01/31/2023  PCP: Patient, No Pcp Per  Admit date: 01/31/2023 Discharge date: 02/03/2023  Admitted From: Home Disposition:  Home  Recommendations for Outpatient Follow-up:  Follow up with PCP in 1-2 weeks Please obtain BMP/CBC in one week Follow-up with oncology in 1 week Take Cipro and Flagyl twice a day for 5 days and follow-up with ID Dr. Tommy Medal in 1 week for further eval and management of HIV Continue Bactrim for PCP prophylaxis  Home Health: None Equipment/Devices:None  Discharge Condition:None  CODE STATUS: Full code Diet recommendation: Regular diet  Brief/Interim Summary: Logan Ward is a 42 y.o. 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      ED Course: In the emergency department, he was found to have a fever of 103.1, heart rate 127, blood pressure 110/56.  Saturating 100% on room air.  Lab work confirms pancytopenia, with WBC 0.4, hemoglobin 7, platelets 44,000.  Sodium is 127, potassium 4.0, normal renal function.  Lactate 1.7.  X-ray is unremarkable.  Admitted for further evaluation and management of neutropenic fever   Chemotherapy-induced neutropenia: Neutropenic fever -Patient admitted in the hospital for severe neutropenia with fever despite using prophylactic Bactrim and Augmentin outpatient. -Recently received chemo for Burkitt's lymphoma -UA negative for infection.  COVID, flu, RSV, strep all negative.  Lactic acid: WNL.  Chest x-ray negative for any acute findings.  Blood culture negative till date.  Urine culture negative. -Continue on empiric antibiotics cefepime and vancomycin -Tmax of 102.  WBC: Back to normal.  ANC: 3000 -Has already received Udenyca on 01/27/2023 -Appreciated oncology help -Patient  wanted to go home.  Okay from oncology standpoint on oral antibiotics.  Discussed with ID on-call Dr. Lucianne Lei Dam-recommended to start Cipro and Flagyl twice a day for 5 days.  Continue Bactrim prophylaxis for PCP.  Follow-up with ID and oncology outpatient   Pancytopenia: -WBC: 0.4-->1.4--> 5.9, H&H: 8.5/26.6--> 7.4/23.4--> 7.8/24.3, platelet: 43--> 38--> 61   Hypokalemia: Potassium replenished before discharge.  Repeat BMP in 1 week with PCP   Hyponatremia: Resolved   HIV/AIDS: Diagnosed about 4 months ago. -Continue Bactrim for PCP prophylaxis.  Needs to follow-up with with ID Dr. Lucianne Lei dam outpatient -Continue Biktarvy   Remote history of syphilis: -Patient report completed treatment  Discharge Diagnoses:  Chemotherapy-induced neutropenia Neutropenic fever Pancytopenia Hypokalemia Hyponatremia HIV/AIDS Remote history of syphilis  obesity with BMI of 36  Discharge Instructions   Allergies as of 02/03/2023   No Known Allergies      Medication List     STOP taking these medications    amoxicillin-clavulanate 875-125 MG tablet Commonly known as: AUGMENTIN   B-complex with vitamin C tablet   hydrocortisone 2.5 % rectal cream Commonly known as: ANUSOL-HC   lidocaine-prilocaine cream Commonly known as: EMLA   magic mouthwash (nystatin, lidocaine, diphenhydrAMINE, alum & mag hydroxide) suspension   polyethylene glycol 17 g packet Commonly known as: MIRALAX / GLYCOLAX   senna-docusate 8.6-50 MG tablet Commonly known as: Senokot-S   sodium bicarbonate/sodium chloride Soln       TAKE these medications    ascorbic acid 500 MG tablet Commonly known as: VITAMIN C Take 500 mg by mouth daily.   Biktarvy 50-200-25 MG Tabs tablet Generic drug: bictegravir-emtricitabine-tenofovir AF Take 1 tablet by mouth daily.   ciprofloxacin 500  MG tablet Commonly known as: Cipro Take 1 tablet (500 mg total) by mouth 2 (two) times daily for 5 days.   dexamethasone 4 MG  tablet Commonly known as: DECADRON Take 1 tablet (4 mg total) by mouth 2 (two) times daily with breakfast and lunch for 2 days after completion of chemotherapy   metroNIDAZOLE 500 MG tablet Commonly known as: Flagyl Take 1 tablet (500 mg total) by mouth 2 (two) times daily for 5 days.   ondansetron 8 MG tablet Commonly known as: ZOFRAN Take 1 tablet (8 mg total) by mouth every 8 (eight) hours as needed for nausea.   prochlorperazine 10 MG tablet Commonly known as: COMPAZINE Take 1 tablet (10 mg total) by mouth every 6 (six) hours as needed.   sulfamethoxazole-trimethoprim 800-160 MG tablet Commonly known as: BACTRIM DS Take 1 tablet by mouth daily.        No Known Allergies  Consultations: Oncology   Procedures/Studies: DG Chest Port 1 View  Result Date: 01/31/2023 CLINICAL DATA:  Fevers.  Shortness of breath. EXAM: PORTABLE CHEST 1 VIEW COMPARISON:  01/10/2023 FINDINGS: There is a left chest wall port a catheter with tip at the cavoatrial junction. Heart size appears within normal limits. Lung volumes are low. There is no pleural effusion or edema. No airspace opacities. Osseous structures appear intact. IMPRESSION: No acute cardiopulmonary abnormalities. Electronically Signed   By: Kerby Moors M.D.   On: 01/31/2023 15:34   NM PET Image Restag (PS) Skull Base To Thigh  Result Date: 01/18/2023 CLINICAL DATA:  Subsequent treatment strategy for Burkitt's lymphoma. Status post 3 cycles of chemo immunotherapy. EXAM: NUCLEAR MEDICINE PET SKULL BASE TO THIGH TECHNIQUE: 13.68 mCi F-18 FDG was injected intravenously. Full-ring PET imaging was performed from the skull base to thigh after the radiotracer. CT data was obtained and used for attenuation correction and anatomic localization. Fasting blood glucose: 96 mg/dl COMPARISON:  PET-CT 11/19/2022 FINDINGS: Mediastinal blood pool activity: SUV max 2.06 Liver activity: SUV max 4.13 NECK: No residual hypermetabolic adenopathy. The large  bulk E right neck mass extending into the right subclavicular area has near completely resolved. Residual subcutaneous disease anterior to the right sternocleidomastoid muscle is noted. This measures 2.5 x 2.4 cm and previously measured 5 x 5 cm. SUV max is 3.12 and was previously 33.13. Incidental CT findings: None. CHEST: The substernal/upper right anterior mediastinal disease has resolved. No residual measurable tumor or hypermetabolism. The right axillary adenopathy the has resolved. No residual measurable disease or hypermetabolism. Incidental CT findings: Left IJ power port in good position without complicating features. No enlarged mediastinal or hilar lymph nodes. No worrisome pulmonary findings. ABDOMEN/PELVIS: No abnormal hypermetabolic activity within the liver, pancreas, adrenal glands, or spleen. No hypermetabolic lymph nodes in the abdomen or pelvis. Incidental CT findings: Thick wall bladder with pericystic interstitial/inflammatory changes could suggest cystitis. Recommend clinical correlation. SKELETON: Diffuse marrow FDG uptake likely related to chemotherapy or marrow stimulating drugs. Incidental CT findings: None. IMPRESSION: 1. PET-CT findings suggest an excellent response to treatment ([place]au[place] 1). The large markedly hypermetabolic right nodal neck mass extending into the thoracic inlet and upper right anterior mediastinum has largely resolved. Only a small area of residual disease in the subcutaneous fat overlying the right sternocleidomastoid muscle remains (Deauville 3). 2. No disease below the diaphragm. 3. Mild diffuse marrow uptake likely due to chemotherapy or marrow stimulating drugs. 4. Thick wall bladder with pericystic interstitial/inflammatory changes could suggest cystitis. Electronically Signed   By: Ricky Stabs.D.  On: 01/18/2023 09:00   DG Chest 2 View  Result Date: 01/10/2023 CLINICAL DATA:  Shortness of breath and fever EXAM: CHEST - 2 VIEW COMPARISON:  Chest x-ray  and CT 11/10/2022. FINDINGS: Left IJ chest port with tip at the SVC right atrial junction. Underinflation. Developing mild opacity at the left lung base. Slight asymmetric vascular congestion at the left hilum. No pneumothorax or effusion. There is large mint of the mediastinum particularly along the aortopulmonary window. Please correlate for any known history including abnormal nodes as seen on CT scan of 11/10/2022. This is more prominent than the prior x-ray IMPRESSION: Chest port. Underinflation with developing opacity left lung base. Possible infiltrate versus atelectasis. Recommend follow-up Widened mediastinum with fullness in the aortopulmonary window. Please correlate for known history of abnormal lymph nodes Electronically Signed   By: Jill Side M.D.   On: 01/10/2023 18:26      Subjective: Patient seen and examined.  Resting comfortably on the bed.  Mom at the bedside.  Denies any complaints.  Wanted to go home.  Discharge Exam: Vitals:   02/02/23 1943 02/03/23 1316  BP:  120/78  Pulse: 97 100  Resp: 17 18  Temp: 99.6 F (37.6 C) 99 F (37.2 C)  SpO2: 100% 99%   Vitals:   02/02/23 1606 02/02/23 1608 02/02/23 1943 02/03/23 1316  BP: 112/63 112/63 114/61 120/78  Pulse: (!) 105 (!) 105 97 100  Resp: 20 20 17 18  $ Temp: (!) 102 F (38.9 C) (!) 102 F (38.9 C) 99.6 F (37.6 C) 99 F (37.2 C)  TempSrc: Oral Oral Oral Oral  SpO2: 98% 98% 100% 99%  Weight:      Height:        General: Pt is alert, awake, not in acute distress Cardiovascular: RRR, S1/S2 +, no rubs, no gallops Respiratory: CTA bilaterally, no wheezing, no rhonchi Abdominal: Soft, NT, ND, bowel sounds + Extremities: no edema, no cyanosis    The results of significant diagnostics from this hospitalization (including imaging, microbiology, ancillary and laboratory) are listed below for reference.     Microbiology: Recent Results (from the past 240 hour(s))  Culture, blood (Routine x 2)     Status: None  (Preliminary result)   Collection Time: 01/31/23  3:23 PM   Specimen: BLOOD  Result Value Ref Range Status   Specimen Description   Final    BLOOD RIGHT ANTECUBITAL Performed at [facility] [address], [place], [place] [address]    Special Requests   Final    BOTTLES DRAWN AEROBIC ONLY Blood Culture adequate volume Performed at [facility] [address], [place], [place] [address]    Culture   Final    NO GROWTH 3 DAYS Performed at [facility], [place] [address], [place], [place] [address]    Report Status PENDING  Incomplete  Culture, blood (Routine x 2)     Status: None (Preliminary result)   Collection Time: 01/31/23  4:25 PM   Specimen: BLOOD  Result Value Ref Range Status   Specimen Description   Final    BLOOD RIGHT ANTECUBITAL Performed at [facility] [address], [place], [place] [address]    Special Requests   Final    BOTTLES DRAWN AEROBIC AND ANAEROBIC Blood Culture results may not be optimal due to an inadequate volume of blood received in culture bottles Performed at [facility] [address], [place], [place] [address]  Culture   Final    NO GROWTH 3 DAYS Performed at [facility], [place] [address], [place], [place] [address]    Report Status PENDING  Incomplete  Group A Strep by PCR     Status: None   Collection Time: 01/31/23  4:25 PM   Specimen: Sterile Swab  Result Value Ref Range Status   Group A Strep by PCR NOT DETECTED NOT DETECTED Final    Comment: Performed at [facility], [place] [address] [place], [place] [address]  Resp panel by RT-PCR (RSV, Flu A&B, Covid)     Status: None   Collection Time: 01/31/23  4:25 PM   Specimen: Nasal Swab  Result Value Ref Range Status   SARS Coronavirus 2 by RT PCR NEGATIVE NEGATIVE Final    Comment: (NOTE) SARS-CoV-2 target nucleic acids are NOT DETECTED.  The SARS-CoV-2 RNA is generally  detectable in upper respiratory specimens during the acute phase of infection. The lowest concentration of SARS-CoV-2 viral copies this assay can detect is 138 copies/mL. A negative result does not preclude SARS-Cov-2 infection and should not be used as the sole basis for treatment or other patient management decisions. A negative result may occur with  improper specimen collection/handling, submission of specimen other than nasopharyngeal swab, presence of viral mutation(s) within the areas targeted by this assay, and inadequate number of viral copies(<138 copies/mL). A negative result must be combined with clinical observations, patient history, and epidemiological information. The expected result is Negative.  Fact Sheet for Patients:  EntrepreneurPulse.com.au  Fact Sheet for Healthcare Providers:  IncredibleEmployment.be  This test is no t yet approved or cleared by the [place] FDA and  has been authorized for detection and/or diagnosis of SARS-CoV-2 by FDA under an Emergency Use Authorization (EUA). This EUA will remain  in effect (meaning this test can be used) for the duration of the COVID-19 declaration under Section 564(b)(1) of the Act, 21 U.S.C.section 360bbb-3(b)(1), unless the authorization is terminated  or revoked sooner.       Influenza A by PCR NEGATIVE NEGATIVE Final   Influenza B by PCR NEGATIVE NEGATIVE Final    Comment: (NOTE) The Xpert Xpress SARS-CoV-2/FLU/RSV plus assay is intended as an aid in the diagnosis of influenza from Nasopharyngeal swab specimens and should not be used as a sole basis for treatment. Nasal washings and aspirates are unacceptable for Xpert Xpress SARS-CoV-2/FLU/RSV testing.  Fact Sheet for Patients: EntrepreneurPulse.com.au  Fact Sheet for Healthcare Providers: IncredibleEmployment.be  This test is not yet approved or cleared by the [place] FDA  and has been authorized for detection and/or diagnosis of SARS-CoV-2 by FDA under an Emergency Use Authorization (EUA). This EUA will remain in effect (meaning this test can be used) for the duration of the COVID-19 declaration under Section 564(b)(1) of the Act, 21 U.S.C. section 360bbb-3(b)(1), unless the authorization is terminated or revoked.     Resp Syncytial Virus by PCR NEGATIVE NEGATIVE Final    Comment: (NOTE) Fact Sheet for Patients: EntrepreneurPulse.com.au  Fact Sheet for Healthcare Providers: IncredibleEmployment.be  This test is not yet approved or cleared by the [place] FDA and has been authorized for detection and/or diagnosis of SARS-CoV-2 by FDA under an Emergency Use Authorization (EUA). This EUA will remain in effect (meaning this test can be used) for the duration of the COVID-19 declaration under Section 564(b)(1) of the Act, 21 U.S.C. section 360bbb-3(b)(1), unless the authorization is terminated or revoked.  Performed at [facility], [place] [address] [place], [place] [address]      Labs: BNP (last 3 results) No results for input(s): "BNP" in the last 8760 hours. Basic Metabolic Panel: Recent Labs  Lab 01/31/23 1259 01/31/23 1521 02/01/23 0500 02/02/23 0528 02/03/23 0615  NA 134* 127* 135 135 133*  K 4.1 4.0 4.6 3.7 3.3*  CL 99 96* 99 103 97*  CO2 27 23 24 22 24  $ GLUCOSE 105* 118* 156* 130* 117*  BUN 8 10 11 11 7  $ CREATININE 0.77 0.81 0.87 1.12 0.99  CALCIUM 9.3 8.5* 9.4 8.7* 8.5*  MG  --   --   --   --  2.1   Liver Function Tests: Recent Labs  Lab 01/31/23 1259 01/31/23 1521 02/02/23 0528  AST 8* 15 23  ALT 17 20 28  $ ALKPHOS 49 43 34*  BILITOT 0.9 1.1 0.8  PROT 6.7 6.7 6.0*  ALBUMIN 4.1 3.8 3.1*   No results for input(s): "LIPASE", "AMYLASE" in the last 168 hours. No results for input(s): "AMMONIA" in the last 168 hours. CBC: Recent Labs  Lab 01/31/23 1259  01/31/23 1521 02/01/23 0500 02/02/23 0528 02/03/23 0615  WBC 0.5* 0.4* 0.4*  0.4* 1.4* 5.9  NEUTROABS 0.0* 0.0* 0.0*  --  3.0  HGB 7.6* 7.0* 8.5*  8.6* 7.4* 7.8*  HCT 23.2* 22.0* 26.6*  26.5* 23.4* 24.3*  MCV 84.7 87.6 86.6  86.0 87.6 87.7  PLT 53* 44* 43*  43* 38* 61*   Cardiac Enzymes: No results for input(s): "CKTOTAL", "CKMB", "CKMBINDEX", "TROPONINI" in the last 168 hours. BNP: Invalid input(s): "POCBNP" CBG: No results for input(s): "GLUCAP" in the last 168 hours. D-Dimer No results for input(s): "DDIMER" in the last 72 hours. Hgb A1c No results for input(s): "HGBA1C" in the last 72 hours. Lipid Profile No results for input(s): "CHOL", "HDL", "LDLCALC", "TRIG", "CHOLHDL", "LDLDIRECT" in the last 72 hours. Thyroid function studies No results for input(s): "TSH", "T4TOTAL", "T3FREE", "THYROIDAB" in the last 72 hours.  Invalid input(s): "FREET3" Anemia work up No results for input(s): "VITAMINB12", "FOLATE", "FERRITIN", "TIBC", "IRON", "RETICCTPCT" in the last 72 hours. Urinalysis    Component Value Date/Time   COLORURINE YELLOW 01/31/2023 1728   APPEARANCEUR CLEAR 01/31/2023 1728   LABSPEC 1.013 01/31/2023 1728   PHURINE 7.0 01/31/2023 1728   GLUCOSEU NEGATIVE 01/31/2023 1728   HGBUR NEGATIVE 01/31/2023 1728   BILIRUBINUR NEGATIVE 01/31/2023 1728   KETONESUR NEGATIVE 01/31/2023 1728   PROTEINUR NEGATIVE 01/31/2023 1728   UROBILINOGEN 1.0 09/02/2013 0854   NITRITE NEGATIVE 01/31/2023 1728   LEUKOCYTESUR NEGATIVE 01/31/2023 1728   Sepsis Labs Recent Labs  Lab 01/31/23 1521 02/01/23 0500 02/02/23 0528 02/03/23 0615  WBC 0.4* 0.4*  0.4* 1.4* 5.9   Microbiology Recent Results (from the past 240 hour(s))  Culture, blood (Routine x 2)     Status: None (Preliminary result)   Collection Time: 01/31/23  3:23 PM   Specimen: BLOOD  Result Value Ref Range Status   Specimen Description   Final    BLOOD RIGHT ANTECUBITAL Performed at [facility], [place] [address] [place], [place] [address]    Special Requests   Final    BOTTLES DRAWN AEROBIC ONLY Blood Culture adequate volume Performed at [facility] [address], [place], [place] [address]    Culture   Final    NO GROWTH 3 DAYS Performed at [facility], [place] [address], [place], [place] [address]    Report Status  PENDING  Incomplete  Culture, blood (Routine x 2)     Status: None (Preliminary result)   Collection Time: 01/31/23  4:25 PM   Specimen: BLOOD  Result Value Ref Range Status   Specimen Description   Final    BLOOD RIGHT ANTECUBITAL Performed at [facility] [address], [place], [place] [address]    Special Requests   Final    BOTTLES DRAWN AEROBIC AND ANAEROBIC Blood Culture results may not be optimal due to an inadequate volume of blood received in culture bottles Performed at [facility] [address], [place], [place] [address]    Culture   Final    NO GROWTH 3 DAYS Performed at [facility], [place] [address] [place], [place] [address]    Report Status PENDING  Incomplete  Group A Strep by PCR     Status: None   Collection Time: 01/31/23  4:25 PM   Specimen: Sterile Swab  Result Value Ref Range Status   Group A Strep by PCR NOT DETECTED NOT DETECTED Final    Comment: Performed at [facility], [place] [address] [place], [place] [address]  Resp panel by RT-PCR (RSV, Flu A&B, Covid)     Status: None   Collection Time: 01/31/23  4:25 PM   Specimen: Nasal Swab  Result Value Ref Range Status   SARS Coronavirus 2 by RT PCR NEGATIVE NEGATIVE Final    Comment: (NOTE) SARS-CoV-2 target nucleic acids are NOT DETECTED.  The SARS-CoV-2 RNA is generally detectable in upper respiratory specimens during the acute phase of infection. The lowest concentration of SARS-CoV-2 viral copies this assay can detect is 138 copies/mL. A negative result does not preclude  SARS-Cov-2 infection and should not be used as the sole basis for treatment or other patient management decisions. A negative result may occur with  improper specimen collection/handling, submission of specimen other than nasopharyngeal swab, presence of viral mutation(s) within the areas targeted by this assay, and inadequate number of viral copies(<138 copies/mL). A negative result must be combined with clinical observations, patient history, and epidemiological information. The expected result is Negative.  Fact Sheet for Patients:  EntrepreneurPulse.com.au  Fact Sheet for Healthcare Providers:  IncredibleEmployment.be  This test is no t yet approved or cleared by the [place] FDA and  has been authorized for detection and/or diagnosis of SARS-CoV-2 by FDA under an Emergency Use Authorization (EUA). This EUA will remain  in effect (meaning this test can be used) for the duration of the COVID-19 declaration under Section 564(b)(1) of the Act, 21 U.S.C.section 360bbb-3(b)(1), unless the authorization is terminated  or revoked sooner.       Influenza A by PCR NEGATIVE NEGATIVE Final   Influenza B by PCR NEGATIVE NEGATIVE Final    Comment: (NOTE) The Xpert Xpress SARS-CoV-2/FLU/RSV plus assay is intended as an aid in the diagnosis of influenza from Nasopharyngeal swab specimens and should not be used as a sole basis for treatment. Nasal washings and aspirates are unacceptable for Xpert Xpress SARS-CoV-2/FLU/RSV testing.  Fact Sheet for Patients: EntrepreneurPulse.com.au  Fact Sheet for Healthcare Providers: IncredibleEmployment.be  This test is not yet approved or cleared by the [place] FDA and has been authorized for detection and/or diagnosis of SARS-CoV-2 by FDA under an Emergency Use Authorization (EUA). This EUA will remain in effect (meaning this test can be used) for the duration of  the COVID-19 declaration under Section 564(b)(1) of the Act, 21 U.S.C. section  360bbb-3(b)(1), unless the authorization is terminated or revoked.     Resp Syncytial Virus by PCR NEGATIVE NEGATIVE Final    Comment: (NOTE) Fact Sheet for Patients: EntrepreneurPulse.com.au  Fact Sheet for Healthcare Providers: IncredibleEmployment.be  This test is not yet approved or cleared by the [place] FDA and has been authorized for detection and/or diagnosis of SARS-CoV-2 by FDA under an Emergency Use Authorization (EUA). This EUA will remain in effect (meaning this test can be used) for the duration of the COVID-19 declaration under Section 564(b)(1) of the Act, 21 U.S.C. section 360bbb-3(b)(1), unless the authorization is terminated or revoked.  Performed at [facility], [place] [address] [place], [place] [address]      Time coordinating discharge: Over 30 minutes  SIGNED:   Mckinley Jewel, MD  [facility] 02/03/2023, 4:37 PM Pager   If 7PM-7AM, please contact night-coverage [email or web address]

## 2023-02-03 NOTE — Progress Notes (Signed)
Pharmacy Antibiotic Note  Logan Ward is a 42 y.o. male admitted on 01/31/2023 with  Febrile neutropenia .  Pharmacy has been consulted for Vanco dosing.   ID: Neutropenic fever despite outpt prophylactic Bactrim+Augmentin - Tmax 102, WBC 5.9, Scr <1 - 2/17 ANC 0.0>>now up to 3 - Udenyca on 01/27/2023  - Continue PTA Biktarvy for HIV   2/17 Cefepime >>   2/16 BCx: ngtd 2/16 GAS PCR: not detected  Plan: Contacted Dr. Doristine Bosworth about adding Vanco as notes have been stating since 2/16--ok by Dr. Doristine Bosworth.  Con't Cefepime 2g q8 per MD Vanco 2g IV x 1 load Vancomycin 1500 mg IV Q 12 hrs. Goal AUC 400-550. Expected AUC: 488 SCr used: 0.99    Height: 6' 1"$  (185.4 cm) Weight: 125 kg (275 lb 9.2 oz) IBW/kg (Calculated) : 79.9  Temp (24hrs), Avg:100.9 F (38.3 C), Min:99.6 F (37.6 C), Max:102 F (38.9 C)  Recent Labs  Lab 01/31/23 1259 01/31/23 1521 01/31/23 1522 02/01/23 0500 02/02/23 0528 02/03/23 0615  WBC 0.5* 0.4*  --  0.4*  0.4* 1.4* 5.9  CREATININE 0.77 0.81  --  0.87 1.12 0.99  LATICACIDVEN  --   --  1.7  --   --   --     Estimated Creatinine Clearance: 138.7 mL/min (by C-G formula based on SCr of 0.99 mg/dL).    No Known Allergies  Logan Ward S. Alford Highland, PharmD, BCPS Clinical Staff Pharmacist [email or web address] [place] 02/03/2023 8:45 AM

## 2023-02-04 ENCOUNTER — Other Ambulatory Visit (HOSPITAL_COMMUNITY): Payer: Self-pay

## 2023-02-04 ENCOUNTER — Encounter (HOSPITAL_COMMUNITY): Payer: Self-pay

## 2023-02-04 ENCOUNTER — Inpatient Hospital Stay (HOSPITAL_BASED_OUTPATIENT_CLINIC_OR_DEPARTMENT_OTHER): Payer: 59 | Admitting: Hematology

## 2023-02-04 ENCOUNTER — Other Ambulatory Visit: Payer: Self-pay

## 2023-02-04 ENCOUNTER — Inpatient Hospital Stay: Payer: 59

## 2023-02-04 VITALS — BP 117/79 | HR 87 | Temp 97.7°F | Resp 20 | Wt 266.0 lb

## 2023-02-04 DIAGNOSIS — C8378 Burkitt lymphoma, lymph nodes of multiple sites: Secondary | ICD-10-CM

## 2023-02-04 DIAGNOSIS — E883 Tumor lysis syndrome: Secondary | ICD-10-CM | POA: Diagnosis not present

## 2023-02-04 DIAGNOSIS — C8370 Burkitt lymphoma, unspecified site: Secondary | ICD-10-CM | POA: Diagnosis present

## 2023-02-04 DIAGNOSIS — Z5112 Encounter for antineoplastic immunotherapy: Secondary | ICD-10-CM | POA: Diagnosis present

## 2023-02-04 DIAGNOSIS — Z79624 Long term (current) use of inhibitors of nucleotide synthesis: Secondary | ICD-10-CM | POA: Diagnosis not present

## 2023-02-04 DIAGNOSIS — D696 Thrombocytopenia, unspecified: Secondary | ICD-10-CM | POA: Diagnosis not present

## 2023-02-04 DIAGNOSIS — Z79899 Other long term (current) drug therapy: Secondary | ICD-10-CM | POA: Diagnosis not present

## 2023-02-04 DIAGNOSIS — B20 Human immunodeficiency virus [HIV] disease: Secondary | ICD-10-CM | POA: Diagnosis present

## 2023-02-04 DIAGNOSIS — Z5189 Encounter for other specified aftercare: Secondary | ICD-10-CM | POA: Diagnosis not present

## 2023-02-04 DIAGNOSIS — R634 Abnormal weight loss: Secondary | ICD-10-CM | POA: Diagnosis not present

## 2023-02-04 LAB — CBC WITH DIFFERENTIAL (CANCER CENTER ONLY)
Abs Immature Granulocytes: 1.39 10*3/uL — ABNORMAL HIGH (ref 0.00–0.07)
Basophils Absolute: 0.1 10*3/uL (ref 0.0–0.1)
Basophils Relative: 1 %
Eosinophils Absolute: 0 10*3/uL (ref 0.0–0.5)
Eosinophils Relative: 0 %
HCT: 24.7 % — ABNORMAL LOW (ref 39.0–52.0)
Hemoglobin: 8.2 g/dL — ABNORMAL LOW (ref 13.0–17.0)
Immature Granulocytes: 14 %
Lymphocytes Relative: 6 %
Lymphs Abs: 0.6 10*3/uL — ABNORMAL LOW (ref 0.7–4.0)
MCH: 28.3 pg (ref 26.0–34.0)
MCHC: 33.2 g/dL (ref 30.0–36.0)
MCV: 85.2 fL (ref 80.0–100.0)
Monocytes Absolute: 1.9 10*3/uL — ABNORMAL HIGH (ref 0.1–1.0)
Monocytes Relative: 19 %
Neutro Abs: 6 10*3/uL (ref 1.7–7.7)
Neutrophils Relative %: 60 %
Platelet Count: 96 10*3/uL — ABNORMAL LOW (ref 150–400)
RBC: 2.9 MIL/uL — ABNORMAL LOW (ref 4.22–5.81)
RDW: 17.5 % — ABNORMAL HIGH (ref 11.5–15.5)
WBC Count: 10 10*3/uL (ref 4.0–10.5)
nRBC: 2 % — ABNORMAL HIGH (ref 0.0–0.2)

## 2023-02-04 LAB — CMP (CANCER CENTER ONLY)
ALT: 41 U/L (ref 0–44)
AST: 19 U/L (ref 15–41)
Albumin: 3.7 g/dL (ref 3.5–5.0)
Alkaline Phosphatase: 70 U/L (ref 38–126)
Anion gap: 7 (ref 5–15)
BUN: 6 mg/dL (ref 6–20)
CO2: 27 mmol/L (ref 22–32)
Calcium: 8.7 mg/dL — ABNORMAL LOW (ref 8.9–10.3)
Chloride: 103 mmol/L (ref 98–111)
Creatinine: 0.94 mg/dL (ref 0.61–1.24)
GFR, Estimated: >60 mL/min (ref >60)
Glucose, Bld: 107 mg/dL — ABNORMAL HIGH (ref 70–99)
Potassium: 3.6 mmol/L (ref 3.5–5.1)
Sodium: 137 mmol/L (ref 135–145)
Total Bilirubin: 0.6 mg/dL (ref 0.3–1.2)
Total Protein: 6.5 g/dL (ref 6.5–8.1)

## 2023-02-04 MED ORDER — SODIUM CHLORIDE 0.9% FLUSH
10.0000 mL | Freq: Once | INTRAVENOUS | Status: AC
  Administered 2023-02-04: 10 mL

## 2023-02-04 MED ORDER — HEPARIN SOD (PORK) LOCK FLUSH 100 UNIT/ML IV SOLN
500.0000 [IU] | Freq: Once | INTRAVENOUS | Status: AC
  Administered 2023-02-04: 500 [IU]

## 2023-02-04 NOTE — Progress Notes (Signed)
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  The patient presented with a rapidly growing right cervical nodal mass extending into the level 2 nodal station of the right supraclavicular nodal station and extension into the superficial right sternocleidomastoid muscle.  There is also hypermetabolic right axillary nodal disease.  No evidence of spleen or solid organ involvement.  No evidence of marrow involvement.  The patient was diagnosed in November/December 2023  HISTORY OF PRESENTING ILLNESS:  Logan Ward is a wonderful 42 y.o. male who has been referred to [place] by Dr Starla Link MD for evaluation and management of newly diagnosed Burkitt's lymphoma in the setting of known history of HIV/AIDS.   Patient has a history of HIV/AIDS [last CD4 count on 10/21/2022 of 501 and viral load of 324 copies per mL, acquired through by sexual contact, diagnosed 3 to 4 months ago, follows with Marya Amsler [facility]], syphilis, obesity presented to the hospital on 11/10/2022 with rapidly growing right anterior chest wall mass.   CT of the neck on 11/10/2022 showed large infiltrative centrally necrotic soft tissue mass throughout the right neck and extending to the upper chest anterior to the clavicle measuring 8.1 x 5.9 x 7.4 cm in size.  There are numerous additional prominent right sided cervical chain lymph nodes measuring 1 cm at level 2A and 1.1 cm in the posterior triangle. CT chest abdomen pelvis done on 11/10/2022 showed enlarged right lower cervical, supraclavicular and bilaterally axillary lymph nodes.  Largest right axillary lymph node measuring 2.2 x 1.7 cm.  Diffuse fat stranding throughout the superior mediastinum.  Prominent subcentimeter retroperitoneal and iliac lymph nodes.  Diffuse retroperitoneal fat stranding of uncertain significance.  No evidence of mass or evidence of organ metastatic disease in the  abdomen or pelvis.  Patient had an ultrasound-guided core needle biopsy of the right neck mass on 11/12/2022 which shows B-cell non-Hodgkin's lymphoma with high-grade features and findings consistent with Burkitt's lymphoma.    PRIOR THERAPY: None  CURRENT THERAPY: Status post 2 cycles of dose adjusted EPOCH-R chemotherapy.  First dose started on 11/18/22.   INTERVAL HISTORY:  Logan Ward 42 y.o. male with Burkitt's lymphoma who is being followed-up for toxicity check.   Patient was last seen by me on 01/31/2023 as in-patient. He was admitted with neutropenic fever with a temperature of 103 F. He also had sore throat and symptomatic anemia with hemoglobin of 7 and platelet count in the 40K range. He received PRBC infusion when he was admitted. He was discharged from the hospital yesterday 02/03/2023. He has been started antibiotics when he was discharged.   Patient reports he has been doing well overall without any new medical concerns since his discharge. He denies of any fever or chills after getting discharge.   He denies night sweats, abdominal pain, abnormal bowel movement, abdominal pain, chest pain, back pain, or leg swelling.   MEDICAL HISTORY: Past Medical History:  Diagnosis Date   Anal fissure    HIV infection ([place])    Internal hemorrhoids    Obesity    Rectal ulcer     ALLERGIES:  has No Known Allergies.  MEDICATIONS:  Current Outpatient Medications  Medication Sig Dispense Refill   ascorbic acid (VITAMIN C) 500 MG tablet Take 500 mg by mouth daily.     bictegravir-emtricitabine-tenofovir AF (BIKTARVY) 50-200-25 MG TABS tablet Take 1 tablet by mouth daily. 30 tablet 5  ciprofloxacin (CIPRO) 500 MG tablet Take 1 tablet (500 mg total) by mouth 2 (two) times daily for 5 days. 10 tablet 0   dexamethasone (DECADRON) 4 MG tablet Take 1 tablet (4 mg total) by mouth 2 (two) times daily with breakfast and lunch for 2 days after completion of chemotherapy 30 tablet 0    metroNIDAZOLE (FLAGYL) 500 MG tablet Take 1 tablet (500 mg total) by mouth 2 (two) times daily for 5 days (no alcohol) 10 tablet 0   ondansetron (ZOFRAN) 8 MG tablet Take 1 tablet (8 mg total) by mouth every 8 (eight) hours as needed for nausea. (Patient not taking: Reported on 01/31/2023) 30 tablet 3   prochlorperazine (COMPAZINE) 10 MG tablet Take 1 tablet (10 mg total) by mouth every 6 (six) hours as needed. (Patient not taking: Reported on 01/31/2023) 30 tablet 2   sulfamethoxazole-trimethoprim (BACTRIM DS) 800-160 MG tablet Take 1 tablet by mouth daily. 30 tablet 1   No current facility-administered medications for this visit.    SURGICAL HISTORY:  Past Surgical History:  Procedure Laterality Date   IR IMAGING GUIDED PORT INSERTION  11/18/2022    REVIEW OF SYSTEMS:    10 Point review of Systems was done is negative except as noted above.   PHYSICAL EXAMINATION: PHONE VISIT .BP 117/79   Pulse 87   Temp 97.7 F (36.5 C)   Resp 20   Wt 266 lb (120.7 kg)   SpO2 100%   BMI 35.09 kg/m  . GENERAL:alert, in no acute distress and comfortable SKIN: no acute rashes, no significant lesions EYES: conjunctiva are pink and non-injected, sclera anicteric OROPHARYNX: MMM, no exudates, no oropharyngeal erythema or ulceration NECK: supple, no JVD LYMPH:  no palpable lymphadenopathy in the cervical, axillary or inguinal regions LUNGS: clear to auscultation b/l with normal respiratory effort HEART: regular rate & rhythm ABDOMEN:  normoactive bowel sounds , non tender, not distended. Extremity: no pedal edema PSYCH: alert & oriented x 3 with fluent speech NEURO: no focal motor/sensory deficits  ECOG PERFORMANCE STATUS: 1   LABORATORY DATA:  .    Latest Ref Rng & Units 02/04/2023   10:29 AM 02/03/2023    1:18 PM 02/03/2023    6:15 AM  CBC  WBC 4.0 - 10.5 K/uL 10.0  7.8  5.9   Hemoglobin 13.0 - 17.0 g/dL 8.2  7.8  7.8   Hematocrit 39.0 - 52.0 % 24.7  25.0  24.3   Platelets 150 - 400  K/uL 96  74  61       Latest Ref Rng & Units 02/03/2023    6:15 AM 02/02/2023    5:28 AM 02/01/2023    5:00 AM  CMP  Glucose 70 - 99 mg/dL 117  130  156   BUN 6 - 20 mg/dL '7  11  11   '$ Creatinine 0.61 - 1.24 mg/dL 0.99  1.12  0.87   Sodium 135 - 145 mmol/L 133  135  135   Potassium 3.5 - 5.1 mmol/L 3.3  3.7  4.6   Chloride 98 - 111 mmol/L 97  103  99   CO2 22 - 32 mmol/L '24  22  24   '$ Calcium 8.9 - 10.3 mg/dL 8.5  8.7  9.4   Total Protein 6.5 - 8.1 g/dL  6.0    Total Bilirubin 0.3 - 1.2 mg/dL  0.8    Alkaline Phos 38 - 126 U/L  34    AST 15 - 41 U/L  23  ALT 0 - 44 U/L  28     . Lab Results  Component Value Date   LDH 322 (H) 12/10/2022    Uric acid 10.4    RADIOGRAPHIC STUDIES:  DG Chest Port 1 View  Result Date: 01/31/2023 CLINICAL DATA:  Fevers.  Shortness of breath. EXAM: PORTABLE CHEST 1 VIEW COMPARISON:  01/10/2023 FINDINGS: There is a left chest wall port a catheter with tip at the cavoatrial junction. Heart size appears within normal limits. Lung volumes are low. There is no pleural effusion or edema. No airspace opacities. Osseous structures appear intact. IMPRESSION: No acute cardiopulmonary abnormalities. Electronically Signed   By: Kerby Moors M.D.   On: 01/31/2023 15:34   NM PET Image Restag (PS) Skull Base To Thigh  Result Date: 01/18/2023 CLINICAL DATA:  Subsequent treatment strategy for Burkitt's lymphoma. Status post 3 cycles of chemo immunotherapy. EXAM: NUCLEAR MEDICINE PET SKULL BASE TO THIGH TECHNIQUE: 13.68 mCi F-18 FDG was injected intravenously. Full-ring PET imaging was performed from the skull base to thigh after the radiotracer. CT data was obtained and used for attenuation correction and anatomic localization. Fasting blood glucose: 96 mg/dl COMPARISON:  PET-CT 11/19/2022 FINDINGS: Mediastinal blood pool activity: SUV max 2.06 Liver activity: SUV max 4.13 NECK: No residual hypermetabolic adenopathy. The large bulk E right neck mass extending into the  right subclavicular area has near completely resolved. Residual subcutaneous disease anterior to the right sternocleidomastoid muscle is noted. This measures 2.5 x 2.4 cm and previously measured 5 x 5 cm. SUV max is 3.12 and was previously 33.13. Incidental CT findings: None. CHEST: The substernal/upper right anterior mediastinal disease has resolved. No residual measurable tumor or hypermetabolism. The right axillary adenopathy the has resolved. No residual measurable disease or hypermetabolism. Incidental CT findings: Left IJ power port in good position without complicating features. No enlarged mediastinal or hilar lymph nodes. No worrisome pulmonary findings. ABDOMEN/PELVIS: No abnormal hypermetabolic activity within the liver, pancreas, adrenal glands, or spleen. No hypermetabolic lymph nodes in the abdomen or pelvis. Incidental CT findings: Thick wall bladder with pericystic interstitial/inflammatory changes could suggest cystitis. Recommend clinical correlation. SKELETON: Diffuse marrow FDG uptake likely related to chemotherapy or marrow stimulating drugs. Incidental CT findings: None. IMPRESSION: 1. PET-CT findings suggest an excellent response to treatment ([place] 1). The large markedly hypermetabolic right nodal neck mass extending into the thoracic inlet and upper right anterior mediastinum has largely resolved. Only a small area of residual disease in the subcutaneous fat overlying the right sternocleidomastoid muscle remains ([place] 3). 2. No disease below the diaphragm. 3. Mild diffuse marrow uptake likely due to chemotherapy or marrow stimulating drugs. 4. Thick wall bladder with pericystic interstitial/inflammatory changes could suggest cystitis. Electronically Signed   By: Marijo Sanes M.D.   On: 01/18/2023 09:00   DG Chest 2 View  Result Date: 01/10/2023 CLINICAL DATA:  Shortness of breath and fever EXAM: CHEST - 2 VIEW COMPARISON:  Chest x-ray and CT 11/10/2022. FINDINGS: Left IJ chest  port with tip at the SVC right atrial junction. Underinflation. Developing mild opacity at the left lung base. Slight asymmetric vascular congestion at the left hilum. No pneumothorax or effusion. There is large mint of the mediastinum particularly along the aortopulmonary window. Please correlate for any known history including abnormal nodes as seen on CT scan of 11/10/2022. This is more prominent than the prior x-ray IMPRESSION: Chest port. Underinflation with developing opacity left lung base. Possible infiltrate versus atelectasis. Recommend follow-up Widened mediastinum with fullness in the  aortopulmonary window. Please correlate for known history of abnormal lymph nodes Electronically Signed   By: Jill Side M.D.   On: 01/10/2023 18:26     ASSESSMENT/PLAN:   42 year old male with HIV/AIDS with newly diagnosed Burkitt's lymphoma   #1  Stage II recently diagnosed Burkitt's lymphoma Presenting with rapidly growing right neck mass extending into the chest, bilateral x-ray lymphadenopathy and also concern for possible involvement of retroperitoneal lymph nodes. Concerning for at least stage II possibly stage III disease. Some borderline lymphadenopathy in the abdomen could also be from his recently diagnosed HIV/AIDS. No clinical symptoms suggestive of CNS involvement at this time. Brain MRI negative.  No constitutional symptoms. No significant cytopenias to suggest bone marrow involvement. Discussed with patient and he is not interested in fertility preservation or sperm banking. First presented in August at [facility] and was treated empirically with steroids and antibiotics with improvement in swelling prior to additional progression. -Currently on dose adjusted EPOCH-R. Status post 1 cycle. Tolerated well overall.  -Scheduled for cycle #2 on 12/10/22. Will help facilitate inpatient admission. -Outpatient follow up on 12/17/22 for Rituxan and Neulasta   #2 HIV/AIDS recently  diagnosed 3 to 4 months ago. Following Dr. Elna Breslow from [place] and currently taking Biktarvy.   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  Patient reports this was completely treated.   #4 spontaneous tumor lysis syndrome:  #5 Prophylaxis: Continue salt water and baking soda mouthwash rinses 4 times a day to reduce mucositis.  MiraLAX and senna as for constipation Bactrim for prophylactic PJP.   #6:Decreased appetite, nausea, belching, and weight loss: Patient has lost weight since last being seen.  Drug to drug interactions noted with PPIs methotrexate.  Patient encouraged to try Pepcid and take nausea medicine prior to eating.  I sent a prescription for Compazine to alternate with Zofran if needed for nausea.  I will refer the patient to member the nutritionist team.  Patient Dors is some sensation of feeling like food is getting stuck in his esophagus.  I offered referral to GI.  The patient declined at this time.  Patient was open to trying Remeron which is an antidepressant but it can help patient's with insomnia and decreased appetite.  I sent a prescription for this to the patient's pharmacy.  #7: Thrombocytopenia: Platelets have normalized during previous treatment but with nadir down to 71k    PLAN: -Discussed lab results from today, 02/04/2023, with the patient. CBC shows normal WBC, slightly decreased hemoglobin of 8.2 which has improved from yesterday, slightly decreased hematocrit of 24.7, and platelets of 96. CMP is pending.  -Patient does not need blood transfusion.  -Patient will receive his cycle 5 of his treatment as scheduled. Next Monday 02/10/2023.   -Recommended to go to the ED if he develops fever or other symptoms.  -Recommended to stay well-hydrated. Drink at least 2L of water.      FOLLOW-UP: Inpatient chemotherapy C5 of EPOCH-R as scheduled  The total time spent in the appointment was 20 minutes* .  All of the patient's questions were answered with apparent  satisfaction. The patient knows to call the clinic with any problems, questions or concerns.   Sullivan Lone MD MS AAHIVMS [facility] Hematology/Oncology Physician [facility]  .*Total Encounter Time as defined by the Centers for Medicare and Medicaid Services includes, in addition to the face-to-face time of a patient visit (documented in the note above) non-face-to-face time: obtaining and reviewing  outside history, ordering and reviewing medications, tests or procedures, care coordination (communications with other health care professionals or caregivers) and documentation in the medical record.   I, Cleda Mccreedy, am acting as a Education administrator for Sullivan Lone, MD. .I have reviewed the above documentation for accuracy and completeness, and I agree with the above. Brunetta Genera MD

## 2023-02-04 NOTE — Progress Notes (Signed)
Bed placement contacted and request made for inpt admission for EPOCH-R for 02/10/23- 02/14/23

## 2023-02-05 LAB — CULTURE, BLOOD (ROUTINE X 2)
Culture: NO GROWTH
Culture: NO GROWTH
Special Requests: ADEQUATE

## 2023-02-10 ENCOUNTER — Inpatient Hospital Stay (HOSPITAL_COMMUNITY)
Admission: AD | Admit: 2023-02-10 | Discharge: 2023-02-14 | DRG: 847 | Disposition: A | Discharge status: Home / Self Care | Payer: 59 | Attending: Hematology | Admitting: Hematology

## 2023-02-10 ENCOUNTER — Encounter (HOSPITAL_COMMUNITY): Payer: Self-pay | Admitting: Hematology

## 2023-02-10 ENCOUNTER — Encounter: Payer: Self-pay | Admitting: Hematology

## 2023-02-10 ENCOUNTER — Other Ambulatory Visit: Payer: Self-pay

## 2023-02-10 DIAGNOSIS — Z5111 Encounter for antineoplastic chemotherapy: Principal | ICD-10-CM

## 2023-02-10 DIAGNOSIS — B20 Human immunodeficiency virus [HIV] disease: Secondary | ICD-10-CM | POA: Diagnosis present

## 2023-02-10 DIAGNOSIS — Z87440 Personal history of urinary (tract) infections: Secondary | ICD-10-CM | POA: Diagnosis not present

## 2023-02-10 DIAGNOSIS — R110 Nausea: Secondary | ICD-10-CM

## 2023-02-10 DIAGNOSIS — D649 Anemia, unspecified: Secondary | ICD-10-CM | POA: Diagnosis present

## 2023-02-10 DIAGNOSIS — C8371 Burkitt lymphoma, lymph nodes of head, face, and neck: Secondary | ICD-10-CM

## 2023-02-10 DIAGNOSIS — Z8619 Personal history of other infectious and parasitic diseases: Secondary | ICD-10-CM

## 2023-02-10 DIAGNOSIS — C8378 Burkitt lymphoma, lymph nodes of multiple sites: Secondary | ICD-10-CM | POA: Diagnosis present

## 2023-02-10 DIAGNOSIS — Z7189 Other specified counseling: Secondary | ICD-10-CM

## 2023-02-10 DIAGNOSIS — K5900 Constipation, unspecified: Secondary | ICD-10-CM | POA: Diagnosis present

## 2023-02-10 LAB — CBC WITH DIFFERENTIAL/PLATELET
Abs Immature Granulocytes: 0.02 10*3/uL (ref 0.00–0.07)
Basophils Absolute: 0 10*3/uL (ref 0.0–0.1)
Basophils Relative: 0 %
Eosinophils Absolute: 0 10*3/uL (ref 0.0–0.5)
Eosinophils Relative: 0 %
HCT: 25.3 % — ABNORMAL LOW (ref 39.0–52.0)
Hemoglobin: 7.6 g/dL — ABNORMAL LOW (ref 13.0–17.0)
Immature Granulocytes: 1 %
Lymphocytes Relative: 13 %
Lymphs Abs: 0.5 10*3/uL — ABNORMAL LOW (ref 0.7–4.0)
MCH: 28 pg (ref 26.0–34.0)
MCHC: 30 g/dL (ref 30.0–36.0)
MCV: 93.4 fL (ref 80.0–100.0)
Monocytes Absolute: 0.7 10*3/uL (ref 0.1–1.0)
Monocytes Relative: 20 %
Neutro Abs: 2.3 10*3/uL (ref 1.7–7.7)
Neutrophils Relative %: 66 %
Platelets: 226 10*3/uL (ref 150–400)
RBC: 2.71 MIL/uL — ABNORMAL LOW (ref 4.22–5.81)
RDW: 19 % — ABNORMAL HIGH (ref 11.5–15.5)
WBC: 3.5 10*3/uL — ABNORMAL LOW (ref 4.0–10.5)
nRBC: 1.1 % — ABNORMAL HIGH (ref 0.0–0.2)

## 2023-02-10 LAB — COMPREHENSIVE METABOLIC PANEL
ALT: 26 U/L (ref 0–44)
AST: 27 U/L (ref 15–41)
Albumin: 3.2 g/dL — ABNORMAL LOW (ref 3.5–5.0)
Alkaline Phosphatase: 42 U/L (ref 38–126)
Anion gap: 5 (ref 5–15)
BUN: 7 mg/dL (ref 6–20)
CO2: 25 mmol/L (ref 22–32)
Calcium: 8.3 mg/dL — ABNORMAL LOW (ref 8.9–10.3)
Chloride: 107 mmol/L (ref 98–111)
Creatinine, Ser: 0.89 mg/dL (ref 0.61–1.24)
GFR, Estimated: >60 mL/min (ref >60)
Glucose, Bld: 104 mg/dL — ABNORMAL HIGH (ref 70–99)
Potassium: 3.6 mmol/L (ref 3.5–5.1)
Sodium: 137 mmol/L (ref 135–145)
Total Bilirubin: 0.8 mg/dL (ref 0.3–1.2)
Total Protein: 6.1 g/dL — ABNORMAL LOW (ref 6.5–8.1)

## 2023-02-10 LAB — PHOSPHORUS: Phosphorus: 3.3 mg/dL (ref 2.5–4.6)

## 2023-02-10 LAB — MAGNESIUM: Magnesium: 2.1 mg/dL (ref 1.7–2.4)

## 2023-02-10 MED ORDER — ENOXAPARIN SODIUM 40 MG/0.4ML IJ SOSY: 40.0000 mg | PREFILLED_SYRINGE | INTRAMUSCULAR | Status: DC

## 2023-02-10 MED ORDER — VINCRISTINE SULFATE CHEMO INJECTION 1 MG/ML
Freq: Once | INTRAVENOUS | Status: AC
  Filled 2023-02-10: qty 13

## 2023-02-10 MED ORDER — SODIUM CHLORIDE 0.9 % IV SOLN
Freq: Once | INTRAVENOUS | Status: AC
  Administered 2023-02-10: 8 mg via INTRAVENOUS
  Filled 2023-02-10: qty 4

## 2023-02-10 MED ORDER — SENNOSIDES-DOCUSATE SODIUM 8.6-50 MG PO TABS
2.0000 | ORAL_TABLET | Freq: Every evening | ORAL | Status: DC | PRN
  Administered 2023-02-12: 2 via ORAL
  Filled 2023-02-10: qty 2

## 2023-02-10 MED ORDER — ALUM & MAG HYDROXIDE-SIMETH 200-200-20 MG/5ML PO SUSP
60.0000 mL | ORAL | Status: DC | PRN
  Administered 2023-02-12: 60 mL via ORAL
  Filled 2023-02-10: qty 60

## 2023-02-10 MED ORDER — SODIUM BICARBONATE/SODIUM CHLORIDE MOUTHWASH
Freq: Four times a day (QID) | OROMUCOSAL | Status: DC
  Administered 2023-02-11: 1 via OROMUCOSAL
  Filled 2023-02-10: qty 1000

## 2023-02-10 MED ORDER — HYDROCORTISONE (PERIANAL) 2.5 % EX CREA: 1.0000 | TOPICAL_CREAM | Freq: Two times a day (BID) | CUTANEOUS | Status: DC | PRN

## 2023-02-10 MED ORDER — ACETAMINOPHEN 325 MG PO TABS: 650.0000 mg | ORAL_TABLET | ORAL | Status: DC | PRN

## 2023-02-10 MED ORDER — BICTEGRAVIR-EMTRICITAB-TENOFOV 50-200-25 MG PO TABS
1.0000 | ORAL_TABLET | Freq: Every day | ORAL | Status: DC
  Administered 2023-02-10 – 2023-02-14 (×5): 1 via ORAL
  Filled 2023-02-10 (×5): qty 1

## 2023-02-10 MED ORDER — SULFAMETHOXAZOLE-TRIMETHOPRIM 800-160 MG PO TABS
1.0000 | ORAL_TABLET | Freq: Every day | ORAL | Status: DC
  Administered 2023-02-10 – 2023-02-14 (×5): 1 via ORAL
  Filled 2023-02-10 (×5): qty 1

## 2023-02-10 MED ORDER — SODIUM CHLORIDE 0.9 % IV SOLN: INTRAVENOUS | Status: DC

## 2023-02-10 MED ORDER — PROCHLORPERAZINE MALEATE 10 MG PO TABS: 10.0000 mg | ORAL_TABLET | Freq: Four times a day (QID) | ORAL | Status: DC | PRN

## 2023-02-10 MED ORDER — LORAZEPAM 0.5 MG PO TABS: 0.5000 mg | ORAL_TABLET | Freq: Four times a day (QID) | ORAL | Status: DC | PRN

## 2023-02-10 NOTE — Progress Notes (Signed)
HEMATOLOGY/ONCOLOGY INPATIENT PROGRESS NOTE  Date of Service: 02/10/2023  Inpatient Attending: .Brunetta Genera, MD   SUBJECTIVE  Patient was last seen by me on 02/04/2023 and he was doing well. He was admitted to the hospital on 01/31/2023 with neutropenic fever. Patient is here for cycle 5 of his treatment.   Patient denies mouth sores, fever, chills, night sweats, unexpected weight loss, chest pain, shortness of breath, fatigue, dizziness, abdominal pain, back pain, or leg swelling.   Patient can proceed with the treatment as planned.   OBJECTIVE:  ***  PHYSICAL EXAMINATION: . Vitals:   02/10/23 0851 02/10/23 0855  BP:  110/66  Pulse:  84  Resp:  20  Temp:  99 F (37.2 C)  TempSrc:  Oral  SpO2:  100%  Weight: 123.6 kg   Height: '6\' 1"'$  (1.854 m)    Filed Weights   02/10/23 0851  Weight: 123.6 kg   .Body mass index is 35.95 kg/m.  GENERAL:alert, in no acute distress and comfortable SKIN: skin color, texture, turgor are normal, no rashes or significant lesions EYES: normal, conjunctiva are pink and non-injected, sclera clear OROPHARYNX:no exudate, no erythema and lips, buccal mucosa, and tongue normal  NECK: supple, no JVD, thyroid normal size, non-tender, without nodularity LYMPH:  no palpable lymphadenopathy in the cervical, axillary or inguinal LUNGS: clear to auscultation with normal respiratory effort HEART: regular rate & rhythm,  no murmurs and no lower extremity edema ABDOMEN: abdomen soft, non-tender, normoactive bowel sounds  Musculoskeletal: no cyanosis of digits and no clubbing  PSYCH: alert & oriented x 3 with fluent speech NEURO: no focal motor/sensory deficits  MEDICAL HISTORY:  Past Medical History:  Diagnosis Date   Anal fissure    HIV infection ([place])    Internal hemorrhoids    Obesity    Rectal ulcer     SURGICAL HISTORY: Past Surgical History:  Procedure Laterality Date   IR IMAGING GUIDED PORT INSERTION  11/18/2022     SOCIAL HISTORY: Social History   Socioeconomic History   Marital status: Single    Spouse name: Not on file   Number of children: Not on file   Years of education: Not on file   Highest education level: Not on file  Occupational History   Not on file  Tobacco Use   Smoking status: Never   Smokeless tobacco: Never  Vaping Use   Vaping Use: Never used  Substance and Sexual Activity   Alcohol use: No   Drug use: No   Sexual activity: Yes    Comment: declined condoms  Other Topics Concern   Not on file  Social History Narrative   Not on file   Social Determinants of Health   Financial Resource Strain: Low Risk  (10/24/2022)   Overall Financial Resource Strain (CARDIA)    Difficulty of Paying Living Expenses: Not hard at all  Food Insecurity: No Food Insecurity (02/10/2023)   Hunger Vital Sign    Worried About Running Out of Food in the Last Year: Never true    Ran Out of Food in the Last Year: Never true  Transportation Needs: No Transportation Needs (02/10/2023)   PRAPARE - Hydrologist (Medical): No    Lack of Transportation (Non-Medical): No  Physical Activity: Sufficiently Active (10/24/2022)   Exercise Vital Sign    Days of Exercise per Week: 3 days    Minutes of Exercise per Session: 60 min  Recent Concern: Physical Activity - Insufficiently Active (  10/24/2022)   Exercise Vital Sign    Days of Exercise per Week: 3 days    Minutes of Exercise per Session: 20 min  Stress: No Stress Concern Present (10/24/2022)   [place]    Feeling of Stress : Not at all  Social Connections: Unknown (10/24/2022)   Social Connection and Isolation Panel [NHANES]    Frequency of Communication with Friends and Family: Twice a week    Frequency of Social Gatherings with Friends and Family: Twice a week    Attends Religious Services: 1 to 4 times per year    Active Member of [employer] or  Organizations: No    Attends Archivist Meetings: 1 to 4 times per year    Marital Status: Patient refused  Intimate Partner Violence: Not At Risk (02/10/2023)   Humiliation, Afraid, Rape, and Kick questionnaire    Fear of Current or Ex-Partner: No    Emotionally Abused: No    Physically Abused: No    Sexually Abused: No    FAMILY HISTORY: Family History  Problem Relation Age of Onset   Hypertension Father    Throat cancer Father    Heart attack Brother    Colon cancer Neg Hx    Esophageal cancer Neg Hx    Stomach cancer Neg Hx     ALLERGIES:  has No Known Allergies.  MEDICATIONS:  Scheduled Meds:  bictegravir-emtricitabine-tenofovir AF  1 tablet Oral Daily   DOXOrubicin (ADRIAMYCIN) 26 mg, etoposide (VEPESID) 102 mg, vinCRIStine (ONCOVIN) 1 mg in sodium chloride 0.9 % 1,000 mL chemo infusion   Intravenous Once   enoxaparin (LOVENOX) injection  40 mg Subcutaneous Q24H   sulfamethoxazole-trimethoprim  1 tablet Oral Daily   Continuous Infusions:  sodium chloride     ondansetron (ZOFRAN) 8 mg, dexamethasone (DECADRON) 10 mg in sodium chloride 0.9 % 50 mL IVPB     PRN Meds:.acetaminophen, alum & mag hydroxide-simeth, hydrocortisone, LORazepam, prochlorperazine, senna-docusate  REVIEW OF SYSTEMS:    10 Point review of Systems was done is negative except as noted above.   LABORATORY DATA:  I have reviewed the data as listed  .    Latest Ref Rng & Units 02/10/2023    9:34 AM 02/04/2023   10:29 AM 02/03/2023    1:18 PM  CBC  WBC 4.0 - 10.5 K/uL 3.5  10.0  7.8   Hemoglobin 13.0 - 17.0 g/dL 7.6  8.2  7.8   Hematocrit 39.0 - 52.0 % 25.3  24.7  25.0   Platelets 150 - 400 K/uL 226  96  74     .    Latest Ref Rng & Units 02/10/2023    9:34 AM 02/04/2023   10:29 AM 02/03/2023    6:15 AM  CMP  Glucose 70 - 99 mg/dL 104  107  117   BUN 6 - 20 mg/dL '7  6  7   '$ Creatinine 0.61 - 1.24 mg/dL 0.89  0.94  0.99   Sodium 135 - 145 mmol/L 137  137  133   Potassium 3.5 -  5.1 mmol/L 3.6  3.6  3.3   Chloride 98 - 111 mmol/L 107  103  97   CO2 22 - 32 mmol/L '25  27  24   '$ Calcium 8.9 - 10.3 mg/dL 8.3  8.7  8.5   Total Protein 6.5 - 8.1 g/dL 6.1  6.5    Total Bilirubin 0.3 - 1.2 mg/dL 0.8  0.6  Alkaline Phos 38 - 126 U/L 42  70    AST 15 - 41 U/L 27  19    ALT 0 - 44 U/L 26  41       RADIOGRAPHIC STUDIES: I have personally reviewed the radiological images as listed and agreed with the findings in the report. DG Chest Port 1 View  Result Date: 01/31/2023 CLINICAL DATA:  Fevers.  Shortness of breath. EXAM: PORTABLE CHEST 1 VIEW COMPARISON:  01/10/2023 FINDINGS: There is a left chest wall port a catheter with tip at the cavoatrial junction. Heart size appears within normal limits. Lung volumes are low. There is no pleural effusion or edema. No airspace opacities. Osseous structures appear intact. IMPRESSION: No acute cardiopulmonary abnormalities. Electronically Signed   By: Kerby Moors M.D.   On: 01/31/2023 15:34   NM PET Image Restag (PS) Skull Base To Thigh  Result Date: 01/18/2023 CLINICAL DATA:  Subsequent treatment strategy for Burkitt's lymphoma. Status post 3 cycles of chemo immunotherapy. EXAM: NUCLEAR MEDICINE PET SKULL BASE TO THIGH TECHNIQUE: 13.68 mCi F-18 FDG was injected intravenously. Full-ring PET imaging was performed from the skull base to thigh after the radiotracer. CT data was obtained and used for attenuation correction and anatomic localization. Fasting blood glucose: 96 mg/dl COMPARISON:  PET-CT 11/19/2022 FINDINGS: Mediastinal blood pool activity: SUV max 2.06 Liver activity: SUV max 4.13 NECK: No residual hypermetabolic adenopathy. The large bulk E right neck mass extending into the right subclavicular area has near completely resolved. Residual subcutaneous disease anterior to the right sternocleidomastoid muscle is noted. This measures 2.5 x 2.4 cm and previously measured 5 x 5 cm. SUV max is 3.12 and was previously 33.13. Incidental CT  findings: None. CHEST: The substernal/upper right anterior mediastinal disease has resolved. No residual measurable tumor or hypermetabolism. The right axillary adenopathy the has resolved. No residual measurable disease or hypermetabolism. Incidental CT findings: Left IJ power port in good position without complicating features. No enlarged mediastinal or hilar lymph nodes. No worrisome pulmonary findings. ABDOMEN/PELVIS: No abnormal hypermetabolic activity within the liver, pancreas, adrenal glands, or spleen. No hypermetabolic lymph nodes in the abdomen or pelvis. Incidental CT findings: Thick wall bladder with pericystic interstitial/inflammatory changes could suggest cystitis. Recommend clinical correlation. SKELETON: Diffuse marrow FDG uptake likely related to chemotherapy or marrow stimulating drugs. Incidental CT findings: None. IMPRESSION: 1. PET-CT findings suggest an excellent response to treatment ([place] 1). The large markedly hypermetabolic right nodal neck mass extending into the thoracic inlet and upper right anterior mediastinum has largely resolved. Only a small area of residual disease in the subcutaneous fat overlying the right sternocleidomastoid muscle remains ([place] 3). 2. No disease below the diaphragm. 3. Mild diffuse marrow uptake likely due to chemotherapy or marrow stimulating drugs. 4. Thick wall bladder with pericystic interstitial/inflammatory changes could suggest cystitis. Electronically Signed   By: Marijo Sanes M.D.   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 Concerning for at least stage II possibly stage III disease. Some borderline lymphadenopathy in the abdomen  could also be from his recently diagnosed HIV/AIDS. No clinical symptoms suggestive of CNS involvement at this time. Brain MRI negative.  No constitutional symptoms. No significant cytopenias to suggest bone marrow involvement. Discussed with patient  and he is not interested in fertility preservation or sperm banking. First presented in August at [facility] and was treated empirically with steroids and antibiotics with improvement in swelling prior to additional progression. -Currently on dose adjusted EPOCH-R. Status post 1 cycle. Tolerated well overall.  -Scheduled for cycle #2 on 12/10/22. Will help facilitate inpatient admission. -Outpatient follow up on 12/17/22 for Rituxan and Neulasta   #2 HIV/AIDS recently diagnosed 3 to 4 months ago. Following Dr. Elna Breslow from [place] and currently taking Biktarvy.   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  Patient reports this was completely treated.   #4 spontaneous tumor lysis syndrome:   #5 Prophylaxis: Continue salt water and baking soda mouthwash rinses 4 times a day to reduce mucositis.  MiraLAX and senna as for constipation Bactrim for prophylactic PJP.    #6:Decreased appetite, nausea, belching, and weight loss: Patient has lost weight since last being seen.  Drug to drug interactions noted with PPIs methotrexate.  Patient encouraged to try Pepcid and take nausea medicine prior to eating.  I sent a prescription for Compazine to alternate with Zofran if needed for nausea.  I will refer the patient to member the nutritionist team.  Patient Dors is some sensation of feeling like food is getting stuck in his esophagus.  I offered referral to GI.  The patient declined at this time.  Patient was open to trying Remeron which is an antidepressant but it can help patient's with insomnia and decreased appetite.  I sent a prescription for this to the patient's pharmacy.   #7: Thrombocytopenia: Platelets have normalized during previous treatment  but with nadir down to 71k     PLAN: -Discussed lab results from today, 02/10/2023, with the patient. CBC shows the patient is anemic with hemoglobin of 7.6 and hematocrit of 25.3. CMP shows slightly decreased calcium levels at 8.3.  -We will watch his CBC and patient will receive blood transfusion if hemoglobin drops below 7.5. Patient's agrees. --Patient does not need blood transfusion as of right now.  -We will decrease etoposide from 50 mg/m^2 to 40 mg/m^2. -We will decrease cyclophosphamide from 750 mg/m^2 to 500 mg/m^2. --Recommended to stay well-hydrated. Drink at least 2L of water.  I spent {CHL ONC TIME VISIT - [id number] counseling the patient face to face. The total time spent in the appointment was {CHL ONC TIME VISIT - [id number] and more than 50% was on counseling and direct patient cares.    Sullivan Lone MD MS AAHIVMS [facility] Hematology/Oncology Physician [facility]  (Office):       [phone] (Work cell):  [phone] (Fax):           [phone]  02/10/2023 11:33 AM    I, Cleda Mccreedy, am acting as a Education administrator for Sullivan Lone, MD.

## 2023-02-10 NOTE — TOC Progression Note (Signed)
Transition of Care [name]) - Progression Note    Patient Details  Name: Logan Ward MRN: [id number] Date of Birth: 1981-01-28  Transition of Care [facility]) CM/SW [place], RN Phone [phone]  02/10/2023, 12:39 PM  Clinical Narrative:     Transition of Care [facility]) Screening Note   Patient Details  Name: Logan Ward Date of Birth: Feb 11, 1981   Transition of Care [facility]) CM/SW Contact:    Angelita Ingles, RN Phone Number: 02/10/2023, 12:39 PM    Transition of Care Department [facility]) has reviewed patient and no TOC needs have been identified at this time. We will continue to monitor patient advancement through interdisciplinary progression rounds. If new patient transition needs arise, please place a TOC consult.          Expected Discharge Plan and Services                                               Social Determinants of Health (SDOH) Interventions SDOH Screenings   Food Insecurity: No Food Insecurity (02/10/2023)  Housing: Low Risk  (02/10/2023)  Transportation Needs: No Transportation Needs (02/10/2023)  Utilities: Not At Risk (02/10/2023)  Alcohol Screen: Low Risk  (10/24/2022)  Depression (PHQ2-9): Low Risk  (01/15/2023)  Financial Resource Strain: Low Risk  (10/24/2022)  Physical Activity: Sufficiently Active (10/24/2022)  Recent Concern: Physical Activity - Insufficiently Active (10/24/2022)  Social Connections: Unknown (10/24/2022)  Stress: No Stress Concern Present (10/24/2022)  Tobacco Use: Low Risk  (02/10/2023)    Readmission Risk Interventions    02/02/2023   10:53 AM 01/20/2023    2:35 PM  Readmission Risk Prevention Plan  Transportation Screening Complete Complete  PCP or Specialist Appt within 3-5 Days  Complete  HRI or [place]  Not Complete  HRI or Home Care Consult comments  n/a  Social Work Consult for [place] Planning/Counseling  Complete  Palliative Care Screening  Not Applicable  Medication Review  Press photographer) Complete Referral to Pharmacy  PCP or Specialist appointment within 3-5 days of discharge Complete   HRI or [place] Complete   SW Recovery Care/Counseling Consult Complete   [place] Not Applicable

## 2023-02-10 NOTE — Progress Notes (Signed)
Premeds given per orders. Administered Adriamycin 26 mg IV, etoposide 102 mg, and vincristine 1 mg in sodium chloride 0.9% in 1019 ml via port a cath over 22 hours. Blood return via port prior to administering chemotherapy.  Will continue to monitor.

## 2023-02-10 NOTE — Progress Notes (Signed)
Per Dr Irene Limbo, ok to treat w/ labs from 02/04/23.  Kennith Center, Pharm.D., CPP 02/10/2023'@9'$ :48 AM

## 2023-02-11 ENCOUNTER — Encounter: Payer: Self-pay | Admitting: Hematology

## 2023-02-11 DIAGNOSIS — Z5111 Encounter for antineoplastic chemotherapy: Secondary | ICD-10-CM | POA: Diagnosis not present

## 2023-02-11 DIAGNOSIS — C8378 Burkitt lymphoma, lymph nodes of multiple sites: Secondary | ICD-10-CM | POA: Diagnosis not present

## 2023-02-11 LAB — COMPREHENSIVE METABOLIC PANEL
ALT: 25 U/L (ref 0–44)
AST: 26 U/L (ref 15–41)
Albumin: 3.3 g/dL — ABNORMAL LOW (ref 3.5–5.0)
Alkaline Phosphatase: 46 U/L (ref 38–126)
Anion gap: 7 (ref 5–15)
BUN: 11 mg/dL (ref 6–20)
CO2: 23 mmol/L (ref 22–32)
Calcium: 8.7 mg/dL — ABNORMAL LOW (ref 8.9–10.3)
Chloride: 106 mmol/L (ref 98–111)
Creatinine, Ser: 0.84 mg/dL (ref 0.61–1.24)
GFR, Estimated: >60 mL/min (ref >60)
Glucose, Bld: 127 mg/dL — ABNORMAL HIGH (ref 70–99)
Potassium: 4.3 mmol/L (ref 3.5–5.1)
Sodium: 136 mmol/L (ref 135–145)
Total Bilirubin: 1.1 mg/dL (ref 0.3–1.2)
Total Protein: 6.3 g/dL — ABNORMAL LOW (ref 6.5–8.1)

## 2023-02-11 LAB — CBC
HCT: 26.7 % — ABNORMAL LOW (ref 39.0–52.0)
Hemoglobin: 8.2 g/dL — ABNORMAL LOW (ref 13.0–17.0)
MCH: 28.8 pg (ref 26.0–34.0)
MCHC: 30.7 g/dL (ref 30.0–36.0)
MCV: 93.7 fL (ref 80.0–100.0)
Platelets: 271 10*3/uL (ref 150–400)
RBC: 2.85 MIL/uL — ABNORMAL LOW (ref 4.22–5.81)
RDW: 19 % — ABNORMAL HIGH (ref 11.5–15.5)
WBC: 5.4 10*3/uL (ref 4.0–10.5)
nRBC: 0.6 % — ABNORMAL HIGH (ref 0.0–0.2)

## 2023-02-11 MED ORDER — VINCRISTINE SULFATE CHEMO INJECTION 1 MG/ML
Freq: Once | INTRAVENOUS | Status: AC
  Filled 2023-02-11: qty 13

## 2023-02-11 MED ORDER — CHLORHEXIDINE GLUCONATE CLOTH 2 % EX PADS
6.0000 | MEDICATED_PAD | Freq: Every day | CUTANEOUS | Status: DC
  Administered 2023-02-11 – 2023-02-13 (×3): 6 via TOPICAL

## 2023-02-11 MED ORDER — SODIUM CHLORIDE 0.9 % IV SOLN
Freq: Once | INTRAVENOUS | Status: AC
  Administered 2023-02-11: 8 mg via INTRAVENOUS
  Filled 2023-02-11: qty 4

## 2023-02-11 NOTE — Progress Notes (Signed)
Day 2  Administered Adriamycin 26 mg IV, Etoposide 102 mg IV, Vincristine '1mg'$  IV via port over 22 hours.  Blood return noted prior.  Will continue to monitor.

## 2023-02-12 ENCOUNTER — Telehealth: Payer: Self-pay

## 2023-02-12 ENCOUNTER — Encounter: Payer: Self-pay | Admitting: Hematology

## 2023-02-12 DIAGNOSIS — Z5111 Encounter for antineoplastic chemotherapy: Secondary | ICD-10-CM | POA: Diagnosis not present

## 2023-02-12 DIAGNOSIS — C8378 Burkitt lymphoma, lymph nodes of multiple sites: Secondary | ICD-10-CM | POA: Diagnosis not present

## 2023-02-12 LAB — CBC
HCT: 26.9 % — ABNORMAL LOW (ref 39.0–52.0)
Hemoglobin: 8.2 g/dL — ABNORMAL LOW (ref 13.0–17.0)
MCH: 28.1 pg (ref 26.0–34.0)
MCHC: 30.5 g/dL (ref 30.0–36.0)
MCV: 92.1 fL (ref 80.0–100.0)
Platelets: 268 10*3/uL (ref 150–400)
RBC: 2.92 MIL/uL — ABNORMAL LOW (ref 4.22–5.81)
RDW: 18.9 % — ABNORMAL HIGH (ref 11.5–15.5)
WBC: 3.8 10*3/uL — ABNORMAL LOW (ref 4.0–10.5)
nRBC: 0.5 % — ABNORMAL HIGH (ref 0.0–0.2)

## 2023-02-12 LAB — COMPREHENSIVE METABOLIC PANEL
ALT: 22 U/L (ref 0–44)
AST: 20 U/L (ref 15–41)
Albumin: 3.4 g/dL — ABNORMAL LOW (ref 3.5–5.0)
Alkaline Phosphatase: 37 U/L — ABNORMAL LOW (ref 38–126)
Anion gap: 6 (ref 5–15)
BUN: 12 mg/dL (ref 6–20)
CO2: 24 mmol/L (ref 22–32)
Calcium: 8.8 mg/dL — ABNORMAL LOW (ref 8.9–10.3)
Chloride: 106 mmol/L (ref 98–111)
Creatinine, Ser: 0.91 mg/dL (ref 0.61–1.24)
GFR, Estimated: >60 mL/min (ref >60)
Glucose, Bld: 124 mg/dL — ABNORMAL HIGH (ref 70–99)
Potassium: 3.9 mmol/L (ref 3.5–5.1)
Sodium: 136 mmol/L (ref 135–145)
Total Bilirubin: 1.3 mg/dL — ABNORMAL HIGH (ref 0.3–1.2)
Total Protein: 6.4 g/dL — ABNORMAL LOW (ref 6.5–8.1)

## 2023-02-12 MED ORDER — SODIUM CHLORIDE 0.9 % IV SOLN
Freq: Once | INTRAVENOUS | Status: AC
  Administered 2023-02-12: 8 mg via INTRAVENOUS
  Filled 2023-02-12: qty 4

## 2023-02-12 MED ORDER — VINCRISTINE SULFATE CHEMO INJECTION 1 MG/ML
Freq: Once | INTRAVENOUS | Status: AC
  Filled 2023-02-12: qty 13

## 2023-02-12 NOTE — Progress Notes (Signed)
Dr. Irene Limbo aware of Tbili & ok to proceed w/ chemo as planned w/ same doses.  Kennith Center, Pharm.D., CPP 02/12/2023'@8'$ :32 AM

## 2023-02-12 NOTE — Progress Notes (Signed)
[facility]  HEMATOLOGY/ONCOLOGY INPATIENT PROGRESS NOTE  Date of Service: 02/11/2023  Inpatient Attending: .Brunetta Genera, MD   SUBJECTIVE  Patient was seen in medical oncology followup. He is tolerating his C5D2 of EPOCH ctx without any new toxcities.  No fevers/chills/night sweats/uncontrolled nausea. Good po intake. Brother and mother at bedside.  OBJECTIVE:  NAD  PHYSICAL EXAMINATION: . Vitals:   02/10/23 1627 02/10/23 2140 02/11/23 1336 02/11/23 2037  BP: 125/68 116/62 118/68 105/62  Pulse: 84 72 65 60  Resp: 20  18   Temp: 98.4 F (36.9 C) 98.4 F (36.9 C) 98 F (36.7 C) 98.5 F (36.9 C)  TempSrc: Oral Oral Oral Oral  SpO2: 100% 100% 98% 100%  Weight:      Height:       Filed Weights   02/10/23 0851  Weight: 272 lb 7.8 oz (123.6 kg)   .Body mass index is 35.95 kg/m. [facility] GENERAL:alert, in no acute distress and comfortable SKIN: no acute rashes, no significant lesions EYES: conjunctiva are pink and non-injected, sclera anicteric OROPHARYNX: MMM, no exudates, no oropharyngeal erythema or ulceration NECK: supple, no JVD LYMPH:  no palpable lymphadenopathy in the cervical, axillary or inguinal regions LUNGS: clear to auscultation b/l with normal respiratory effort HEART: regular rate & rhythm ABDOMEN:  normoactive bowel sounds , non tender, not distended. Extremity: no pedal edema PSYCH: alert & oriented x 3 with fluent speech NEURO: no focal motor/sensory deficits   MEDICAL HISTORY:  Past Medical History:  Diagnosis Date   Anal fissure    HIV infection ([place])    Internal hemorrhoids    Obesity    Rectal ulcer     SURGICAL HISTORY: Past Surgical History:  Procedure Laterality Date   IR IMAGING GUIDED PORT INSERTION  11/18/2022    SOCIAL HISTORY: Social History   Socioeconomic History   Marital status: Single    Spouse name: Not on file   Number of children: Not on file   Years of education: Not on file   Highest education level: Not on file   Occupational History   Not on file  Tobacco Use   Smoking status: Never   Smokeless tobacco: Never  Vaping Use   Vaping Use: Never used  Substance and Sexual Activity   Alcohol use: No   Drug use: No   Sexual activity: Yes    Comment: declined condoms  Other Topics Concern   Not on file  Social History Narrative   Not on file   Social Determinants of Health   Financial Resource Strain: Low Risk  (10/24/2022)   Overall Financial Resource Strain (CARDIA)    Difficulty of Paying Living Expenses: Not hard at all  Food Insecurity: No Food Insecurity (02/10/2023)   Hunger Vital Sign    Worried About Running Out of Food in the Last Year: Never true    Ran Out of Food in the Last Year: Never true  Transportation Needs: No Transportation Needs (02/10/2023)   PRAPARE - Hydrologist (Medical): No    Lack of Transportation (Non-Medical): No  Physical Activity: Sufficiently Active (10/24/2022)   Exercise Vital Sign    Days of Exercise per Week: 3 days    Minutes of Exercise per Session: 60 min  Recent Concern: Physical Activity - Insufficiently Active (10/24/2022)   Exercise Vital Sign    Days of Exercise per Week: 3 days    Minutes of Exercise per Session: 20 min  Stress: No Stress Concern Present (  10/24/2022)   [place]    Feeling of Stress : Not at all  Social Connections: Unknown (10/24/2022)   Social Connection and Isolation Panel [NHANES]    Frequency of Communication with Friends and Family: Twice a week    Frequency of Social Gatherings with Friends and Family: Twice a week    Attends Religious Services: 1 to 4 times per year    Active Member of [employer] or Organizations: No    Attends Archivist Meetings: 1 to 4 times per year    Marital Status: Patient refused  Intimate Partner Violence: Not At Risk (02/10/2023)   Humiliation, Afraid, Rape, and Kick questionnaire    Fear of  Current or Ex-Partner: No    Emotionally Abused: No    Physically Abused: No    Sexually Abused: No    FAMILY HISTORY: Family History  Problem Relation Age of Onset   Hypertension Father    Throat cancer Father    Heart attack Brother    Colon cancer Neg Hx    Esophageal cancer Neg Hx    Stomach cancer Neg Hx     ALLERGIES:  has No Known Allergies.  MEDICATIONS:  Scheduled Meds:  bictegravir-emtricitabine-tenofovir AF  1 tablet Oral Daily   Chlorhexidine Gluconate Cloth  6 each Topical Daily   DOXOrubicin (ADRIAMYCIN) 26 mg, etoposide (VEPESID) 102 mg, vinCRIStine (ONCOVIN) 1 mg in sodium chloride 0.9 % 1,000 mL chemo infusion   Intravenous Once   sodium bicarbonate/sodium chloride   Mouth Rinse QID   sulfamethoxazole-trimethoprim  1 tablet Oral Daily   Continuous Infusions:  sodium chloride 10 mL/hr at 02/11/23 2100   PRN Meds:.acetaminophen, alum & mag hydroxide-simeth, hydrocortisone, LORazepam, prochlorperazine, senna-docusate  REVIEW OF SYSTEMS:    .10 Point review of Systems was done is negative except as noted above.  LABORATORY DATA:  I have reviewed the data as listed  .    Latest Ref Rng & Units 02/11/2023    5:33 AM 02/10/2023    9:34 AM 02/04/2023   10:29 AM  CBC  WBC 4.0 - 10.5 K/uL 5.4  3.5  10.0   Hemoglobin 13.0 - 17.0 g/dL 8.2  7.6  8.2   Hematocrit 39.0 - 52.0 % 26.7  25.3  24.7   Platelets 150 - 400 K/uL 271  226  96    ANC 0 .    Latest Ref Rng & Units 02/11/2023    5:33 AM 02/10/2023    9:34 AM 02/04/2023   10:29 AM  CMP  Glucose 70 - 99 mg/dL 127  104  107   BUN 6 - 20 mg/dL '11  7  6   '$ Creatinine 0.61 - 1.24 mg/dL 0.84  0.89  0.94   Sodium 135 - 145 mmol/L 136  137  137   Potassium 3.5 - 5.1 mmol/L 4.3  3.6  3.6   Chloride 98 - 111 mmol/L 106  107  103   CO2 22 - 32 mmol/L '23  25  27   '$ Calcium 8.9 - 10.3 mg/dL 8.7  8.3  8.7   Total Protein 6.5 - 8.1 g/dL 6.3  6.1  6.5   Total Bilirubin 0.3 - 1.2 mg/dL 1.1  0.8  0.6   Alkaline Phos  38 - 126 U/L 46  42  70   AST 15 - 41 U/L '26  27  19   '$ ALT 0 - 44 U/L 25  26  41  RADIOGRAPHIC STUDIES: I have personally reviewed the radiological images as listed and agreed with the findings in the report. DG Chest Port 1 View  Result Date: 01/31/2023 CLINICAL DATA:  Fevers.  Shortness of breath. EXAM: PORTABLE CHEST 1 VIEW COMPARISON:  01/10/2023 FINDINGS: There is a left chest wall port a catheter with tip at the cavoatrial junction. Heart size appears within normal limits. Lung volumes are low. There is no pleural effusion or edema. No airspace opacities. Osseous structures appear intact. IMPRESSION: No acute cardiopulmonary abnormalities. Electronically Signed   By: Kerby Moors M.D.   On: 01/31/2023 15:34   NM PET Image Restag (PS) Skull Base To Thigh  Result Date: 01/18/2023 CLINICAL DATA:  Subsequent treatment strategy for Burkitt's lymphoma. Status post 3 cycles of chemo immunotherapy. EXAM: NUCLEAR MEDICINE PET SKULL BASE TO THIGH TECHNIQUE: 13.68 mCi F-18 FDG was injected intravenously. Full-ring PET imaging was performed from the skull base to thigh after the radiotracer. CT data was obtained and used for attenuation correction and anatomic localization. Fasting blood glucose: 96 mg/dl COMPARISON:  PET-CT 11/19/2022 FINDINGS: Mediastinal blood pool activity: SUV max 2.06 Liver activity: SUV max 4.13 NECK: No residual hypermetabolic adenopathy. The large bulk E right neck mass extending into the right subclavicular area has near completely resolved. Residual subcutaneous disease anterior to the right sternocleidomastoid muscle is noted. This measures 2.5 x 2.4 cm and previously measured 5 x 5 cm. SUV max is 3.12 and was previously 33.13. Incidental CT findings: None. CHEST: The substernal/upper right anterior mediastinal disease has resolved. No residual measurable tumor or hypermetabolism. The right axillary adenopathy the has resolved. No residual measurable disease or  hypermetabolism. Incidental CT findings: Left IJ power port in good position without complicating features. No enlarged mediastinal or hilar lymph nodes. No worrisome pulmonary findings. ABDOMEN/PELVIS: No abnormal hypermetabolic activity within the liver, pancreas, adrenal glands, or spleen. No hypermetabolic lymph nodes in the abdomen or pelvis. Incidental CT findings: Thick wall bladder with pericystic interstitial/inflammatory changes could suggest cystitis. Recommend clinical correlation. SKELETON: Diffuse marrow FDG uptake likely related to chemotherapy or marrow stimulating drugs. Incidental CT findings: None. IMPRESSION: 1. PET-CT findings suggest an excellent response to treatment ([place] 1). The large markedly hypermetabolic right nodal neck mass extending into the thoracic inlet and upper right anterior mediastinum has largely resolved. Only a small area of residual disease in the subcutaneous fat overlying the right sternocleidomastoid muscle remains ([place] 3). 2. No disease below the diaphragm. 3. Mild diffuse marrow uptake likely due to chemotherapy or marrow stimulating drugs. 4. Thick wall bladder with pericystic interstitial/inflammatory changes could suggest cystitis. Electronically Signed   By: Marijo Sanes M.D.   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 Concerning for at least stage II possibly stage III disease. Some borderline lymphadenopathy in the abdomen could also be from his recently diagnosed HIV/AIDS. No clinical symptoms suggestive of CNS involvement at this time. Brain MRI negative.  No constitutional symptoms. No significant cytopenias to suggest bone marrow  involvement. Discussed with patient and he is not interested in fertility preservation or sperm banking. First presented in August at [facility] and was treated empirically with steroids and antibiotics with improvement in swelling prior to additional progression. -Currently  on dose adjusted EPOCH-R. Status post 1 cycle. Tolerated well overall.  -Scheduled for cycle #2 on 12/10/22. Will help facilitate inpatient admission. -Outpatient follow up on 12/17/22 for Rituxan and Neulasta   #2 HIV/AIDS recently diagnosed 3 to 4 months ago. Following Dr. Elna Breslow from [facility] and currently taking Biktarvy.   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  Patient reports this was completely treated.   #4 Prophylaxis: Continue salt water and baking soda mouthwash rinses 4 times a day to reduce mucositis.  MiraLAX and senna as for constipation Bactrim for prophylactic PJP.    #5 Pancytopenia related to chemotherapy -- thrombocytopenia and neutropenia resolved. Still has normocytic anemia Cytopenias accentuated by his HIV/AIDS and related medications    PLAN: -Discussed lab results from today, 02/11/2023, with the patient.  CBC shows the patient is anemic with hemoglobin of 8.2  CMP stable -We will watch his CBC and patient will receive blood transfusion if hemoglobin drops below 7.5. Patient's agrees. --Patient does not need blood transfusion as of right now.  -okay to proceed with C5D2 of EPOCH-R  -chemotherapy orders reviewed and signed and co-ordinated with pharmacist. -outpatient Rituxan on 02/17/2023 -will try to schedule G-CSF on Saturday 3/2..hopefully will finish his ctx by noon on Friday. --Recommended to stay well-hydrated. Drink at least 2L of water. -NACL/NAHCOS mouthwash QID -patient declined lovenox for VTE prophylaxis and was recommeded to ambulated and use SCD.   Logan KitchenThe total time spent in the appointment was 25 minutes* .  All of the patient's questions were answered with  apparent satisfaction. The patient knows to call the clinic with any problems, questions or concerns.   Sullivan Lone MD MS AAHIVMS [facility] Hematology/Oncology Physician [facility]  .*Total Encounter Time as defined by the Centers for Medicare and Medicaid Services includes, in addition to the face-to-face time of a patient visit (documented in the note above) non-face-to-face time: obtaining and reviewing outside history, ordering and reviewing medications, tests or procedures, care coordination (communications with other health care professionals or caregivers) and documentation in the medical record.

## 2023-02-12 NOTE — Telephone Encounter (Signed)
Notified Patient of completion of Attending Physician's Statement for Disability Claim Number [id number]. Fax transmission confirmation received. Copy of forms placed for pick-up as requested by patient.

## 2023-02-13 ENCOUNTER — Encounter: Payer: Self-pay | Admitting: Hematology

## 2023-02-13 ENCOUNTER — Other Ambulatory Visit: Payer: Self-pay

## 2023-02-13 ENCOUNTER — Other Ambulatory Visit (HOSPITAL_COMMUNITY): Payer: Self-pay

## 2023-02-13 DIAGNOSIS — Z5111 Encounter for antineoplastic chemotherapy: Secondary | ICD-10-CM | POA: Diagnosis not present

## 2023-02-13 DIAGNOSIS — C8378 Burkitt lymphoma, lymph nodes of multiple sites: Secondary | ICD-10-CM

## 2023-02-13 LAB — COMPREHENSIVE METABOLIC PANEL
ALT: 19 U/L (ref 0–44)
AST: 18 U/L (ref 15–41)
Albumin: 3.5 g/dL (ref 3.5–5.0)
Alkaline Phosphatase: 34 U/L — ABNORMAL LOW (ref 38–126)
Anion gap: 8 (ref 5–15)
BUN: 18 mg/dL (ref 6–20)
CO2: 24 mmol/L (ref 22–32)
Calcium: 8.9 mg/dL (ref 8.9–10.3)
Chloride: 101 mmol/L (ref 98–111)
Creatinine, Ser: 0.86 mg/dL (ref 0.61–1.24)
GFR, Estimated: >60 mL/min (ref >60)
Glucose, Bld: 110 mg/dL — ABNORMAL HIGH (ref 70–99)
Potassium: 3.8 mmol/L (ref 3.5–5.1)
Sodium: 133 mmol/L — ABNORMAL LOW (ref 135–145)
Total Bilirubin: 1.4 mg/dL — ABNORMAL HIGH (ref 0.3–1.2)
Total Protein: 6.4 g/dL — ABNORMAL LOW (ref 6.5–8.1)

## 2023-02-13 LAB — CBC
HCT: 26.3 % — ABNORMAL LOW (ref 39.0–52.0)
Hemoglobin: 8.4 g/dL — ABNORMAL LOW (ref 13.0–17.0)
MCH: 28.4 pg (ref 26.0–34.0)
MCHC: 31.9 g/dL (ref 30.0–36.0)
MCV: 88.9 fL (ref 80.0–100.0)
Platelets: 267 10*3/uL (ref 150–400)
RBC: 2.96 MIL/uL — ABNORMAL LOW (ref 4.22–5.81)
RDW: 18.2 % — ABNORMAL HIGH (ref 11.5–15.5)
WBC: 2.6 10*3/uL — ABNORMAL LOW (ref 4.0–10.5)
nRBC: 0 % (ref 0.0–0.2)

## 2023-02-13 MED ORDER — SODIUM CHLORIDE 0.9 % IV SOLN
500.0000 mg/m2 | Freq: Once | INTRAVENOUS | Status: AC
  Administered 2023-02-14: 1280 mg via INTRAVENOUS
  Filled 2023-02-13: qty 64

## 2023-02-13 MED ORDER — SODIUM CHLORIDE 0.9 % IV SOLN
Freq: Once | INTRAVENOUS | Status: AC
  Administered 2023-02-13: 18 mg via INTRAVENOUS
  Filled 2023-02-13: qty 4

## 2023-02-13 MED ORDER — VINCRISTINE SULFATE CHEMO INJECTION 1 MG/ML
Freq: Once | INTRAVENOUS | Status: AC
  Filled 2023-02-13: qty 13

## 2023-02-13 MED ORDER — SODIUM CHLORIDE 0.9 % IV SOLN
Freq: Once | INTRAVENOUS | Status: AC
  Administered 2023-02-14: 16 mg via INTRAVENOUS
  Filled 2023-02-13: qty 8

## 2023-02-13 NOTE — Progress Notes (Signed)
Mobility Specialist - Progress Note   02/13/23 1344  Mobility  Activity Ambulated with assistance in hallway  Level of Assistance Independent after set-up  Assistive Device None  Distance Ambulated (ft) 600 ft  Activity Response Tolerated well  Mobility Referral Yes  $Mobility charge 1 Mobility   Pt received in bed and agreed to mobility, had no c/o pain nor discomfort during session. Pt returned to bed with all needs met.   Roderick Pee Mobility Specialist

## 2023-02-14 ENCOUNTER — Other Ambulatory Visit (HOSPITAL_COMMUNITY): Payer: Self-pay

## 2023-02-14 ENCOUNTER — Telehealth: Payer: Self-pay | Admitting: Hematology

## 2023-02-14 ENCOUNTER — Other Ambulatory Visit: Payer: Self-pay | Admitting: Hematology

## 2023-02-14 ENCOUNTER — Encounter: Payer: Self-pay | Admitting: Hematology

## 2023-02-14 DIAGNOSIS — Z5111 Encounter for antineoplastic chemotherapy: Secondary | ICD-10-CM | POA: Diagnosis not present

## 2023-02-14 DIAGNOSIS — C8378 Burkitt lymphoma, lymph nodes of multiple sites: Secondary | ICD-10-CM | POA: Diagnosis not present

## 2023-02-14 LAB — CBC
HCT: 26 % — ABNORMAL LOW (ref 39.0–52.0)
Hemoglobin: 8.3 g/dL — ABNORMAL LOW (ref 13.0–17.0)
MCH: 27.9 pg (ref 26.0–34.0)
MCHC: 31.9 g/dL (ref 30.0–36.0)
MCV: 87.2 fL (ref 80.0–100.0)
Platelets: 273 10*3/uL (ref 150–400)
RBC: 2.98 MIL/uL — ABNORMAL LOW (ref 4.22–5.81)
RDW: 17.3 % — ABNORMAL HIGH (ref 11.5–15.5)
WBC: 1.8 10*3/uL — ABNORMAL LOW (ref 4.0–10.5)
nRBC: 0 % (ref 0.0–0.2)

## 2023-02-14 LAB — COMPREHENSIVE METABOLIC PANEL
ALT: 17 U/L (ref 0–44)
AST: 17 U/L (ref 15–41)
Albumin: 3.5 g/dL (ref 3.5–5.0)
Alkaline Phosphatase: 35 U/L — ABNORMAL LOW (ref 38–126)
Anion gap: 8 (ref 5–15)
BUN: 17 mg/dL (ref 6–20)
CO2: 23 mmol/L (ref 22–32)
Calcium: 8.7 mg/dL — ABNORMAL LOW (ref 8.9–10.3)
Chloride: 103 mmol/L (ref 98–111)
Creatinine, Ser: 0.84 mg/dL (ref 0.61–1.24)
GFR, Estimated: >60 mL/min (ref >60)
Glucose, Bld: 102 mg/dL — ABNORMAL HIGH (ref 70–99)
Potassium: 3.6 mmol/L (ref 3.5–5.1)
Sodium: 134 mmol/L — ABNORMAL LOW (ref 135–145)
Total Bilirubin: 1.2 mg/dL (ref 0.3–1.2)
Total Protein: 6.5 g/dL (ref 6.5–8.1)

## 2023-02-14 MED ORDER — LEVOFLOXACIN 500 MG PO TABS
500.0000 mg | ORAL_TABLET | Freq: Every day | ORAL | 0 refills | Status: DC
  Filled 2023-02-14: qty 10, 10d supply, fill #0

## 2023-02-14 MED ORDER — HEPARIN SOD (PORK) LOCK FLUSH 100 UNIT/ML IV SOLN: 500.0000 [IU] | Freq: Once | INTRAVENOUS | Status: DC

## 2023-02-14 MED ORDER — HEPARIN SOD (PORK) LOCK FLUSH 100 UNIT/ML IV SOLN
INTRAVENOUS | Status: AC
  Filled 2023-02-14: qty 5

## 2023-02-14 MED ORDER — DEXAMETHASONE 4 MG PO TABS
4.0000 mg | ORAL_TABLET | Freq: Two times a day (BID) | ORAL | 1 refills | Status: DC
  Filled 2023-02-14: qty 30, 15d supply, fill #0

## 2023-02-14 MED ORDER — SENNOSIDES-DOCUSATE SODIUM 8.6-50 MG PO TABS
2.0000 | ORAL_TABLET | Freq: Every evening | ORAL | 0 refills | Status: DC | PRN
  Filled 2023-02-14: qty 60, 30d supply, fill #0

## 2023-02-14 NOTE — Telephone Encounter (Signed)
Called patient per 2/29 IB to schedule injection. Patient scheduled and notified.

## 2023-02-14 NOTE — Progress Notes (Signed)
Logan Ward  HEMATOLOGY/ONCOLOGY INPATIENT PROGRESS NOTE  Date of Service: 02/12/2023  Inpatient Attending: .Brunetta Genera, MD   SUBJECTIVE  Patient was seen in medical oncology follow-up for cycle 5-day 3 of [facility] chemotherapy. He notes some mild nausea controlled with medications.  Acute new symptoms.  No fevers no chills.  Hemoglobin remains more than 8 requirement for PRBC transfusion. No chest pain or shortness of breath.  OBJECTIVE:  NAD  PHYSICAL EXAMINATION: .  Vital signs stable. Logan Ward GENERAL:alert, in no acute distress and comfortable OROPHARYNX: MMM, no exudates, no oropharyngeal erythema or ulceration NECK: supple, no JVD LYMPH:  no palpable lymphadenopathy in the cervical, axillary or inguinal regions LUNGS: clear to auscultation b/l with normal respiratory effort HEART: regular rate & rhythm ABDOMEN:  normoactive bowel sounds , non tender, not distended. Extremity: no pedal edema  MEDICAL HISTORY:  Past Medical History:  Diagnosis Date   Anal fissure    HIV infection ([place])    Internal hemorrhoids    Obesity    Rectal ulcer     SURGICAL HISTORY: Past Surgical History:  Procedure Laterality Date   IR IMAGING GUIDED PORT INSERTION  11/18/2022    SOCIAL HISTORY: Social History   Socioeconomic History   Marital status: Single    Spouse name: Not on file   Number of children: Not on file   Years of education: Not on file   Highest education level: Not on file  Occupational History   Not on file  Tobacco Use   Smoking status: Never   Smokeless tobacco: Never  Vaping Use   Vaping Use: Never used  Substance and Sexual Activity   Alcohol use: No   Drug use: No   Sexual activity: Yes    Comment: declined condoms  Other Topics Concern   Not on file  Social History Narrative   Not on file   Social Determinants of Health   Financial Resource Strain: Low Risk  (10/24/2022)   Overall Financial Resource Strain (CARDIA)    Difficulty of Paying Living  Expenses: Not hard at all  Food Insecurity: No Food Insecurity (02/10/2023)   Hunger Vital Sign    Worried About Running Out of Food in the Last Year: Never true    Ran Out of Food in the Last Year: Never true  Transportation Needs: No Transportation Needs (02/10/2023)   PRAPARE - Hydrologist (Medical): No    Lack of Transportation (Non-Medical): No  Physical Activity: Sufficiently Active (10/24/2022)   Exercise Vital Sign    Days of Exercise per Week: 3 days    Minutes of Exercise per Session: 60 min  Recent Concern: Physical Activity - Insufficiently Active (10/24/2022)   Exercise Vital Sign    Days of Exercise per Week: 3 days    Minutes of Exercise per Session: 20 min  Stress: No Stress Concern Present (10/24/2022)   [employer]    Feeling of Stress : Not at all  Social Connections: Unknown (10/24/2022)   Social Connection and Isolation Panel [NHANES]    Frequency of Communication with Friends and Family: Twice a week    Frequency of Social Gatherings with Friends and Family: Twice a week    Attends Religious Services: 1 to 4 times per year    Active Member of [employer] or Organizations: No    Attends Archivist Meetings: 1 to 4 times per year    Marital Status: Patient refused  Intimate Partner Violence: Not At Risk (02/10/2023)   Humiliation, Afraid, Rape, and Kick questionnaire    Fear of Current or Ex-Partner: No    Emotionally Abused: No    Physically Abused: No    Sexually Abused: No    FAMILY HISTORY: Family History  Problem Relation Age of Onset   Hypertension Father    Throat cancer Father    Heart attack Brother    Colon cancer Neg Hx    Esophageal cancer Neg Hx    Stomach cancer Neg Hx     ALLERGIES:  has No Known Allergies.  MEDICATIONS:  Scheduled Meds:  bictegravir-emtricitabine-tenofovir AF  1 tablet Oral Daily   Chlorhexidine Gluconate Cloth  6 each  Topical Daily   heparin lock flush  500 Units Intravenous Once   sodium bicarbonate/sodium chloride   Mouth Rinse QID   sulfamethoxazole-trimethoprim  1 tablet Oral Daily   Continuous Infusions:  sodium chloride 10 mL/hr at 02/14/23 0431   PRN Meds:.acetaminophen, alum & mag hydroxide-simeth, hydrocortisone, LORazepam, prochlorperazine, senna-docusate  REVIEW OF SYSTEMS:    .10 Point review of Systems was done is negative except as noted above.  LABORATORY DATA:  I have reviewed the data as listed  Component     Latest Ref Rng 02/12/2023  Sodium     135 - 145 mmol/L 136   Potassium     3.5 - 5.1 mmol/L 3.9   Chloride     98 - 111 mmol/L 106   CO2     22 - 32 mmol/L 24   Glucose     70 - 99 mg/dL 124 (H)   BUN     6 - 20 mg/dL 12   Creatinine     0.61 - 1.24 mg/dL 0.91   Calcium     8.9 - 10.3 mg/dL 8.8 (L)   Total Protein     6.5 - 8.1 g/dL 6.4 (L)   Albumin     3.5 - 5.0 g/dL 3.4 (L)   AST     15 - 41 U/L 20   ALT     0 - 44 U/L 22   Alkaline Phosphatase     38 - 126 U/L 37 (L)   Total Bilirubin     0.3 - 1.2 mg/dL 1.3 (H)   GFR, Estimated     >60 mL/min >60   Anion gap     5 - 15  6   WBC     4.0 - 10.5 K/uL 3.8 (L)   RBC     4.22 - 5.81 MIL/uL 2.92 (L)   Hemoglobin     13.0 - 17.0 g/dL 8.2 (L)   HCT     39.0 - 52.0 % 26.9 (L)   MCV     80.0 - 100.0 fL 92.1   MCH     26.0 - 34.0 pg 28.1   MCHC     30.0 - 36.0 g/dL 30.5   RDW     11.5 - 15.5 % 18.9 (H)   Platelets     150 - 400 K/uL 268   nRBC     0.0 - 0.2 % 0.5 (H)     Legend: (H) High (L) Low  RADIOGRAPHIC STUDIES: I have personally reviewed the radiological images as listed and agreed with the findings in the report. DG Chest Port 1 View  Result Date: 01/31/2023 CLINICAL DATA:  Fevers.  Shortness of breath. EXAM: PORTABLE CHEST 1 VIEW COMPARISON:  01/10/2023 FINDINGS:  There is a left chest wall port a catheter with tip at the cavoatrial junction. Heart size appears within normal  limits. Lung volumes are low. There is no pleural effusion or edema. No airspace opacities. Osseous structures appear intact. IMPRESSION: No acute cardiopulmonary abnormalities. Electronically Signed   By: Kerby Moors M.D.   On: 01/31/2023 15:34   NM PET Image Restag (PS) Skull Base To Thigh  Result Date: 01/18/2023 CLINICAL DATA:  Subsequent treatment strategy for Burkitt's lymphoma. Status post 3 cycles of chemo immunotherapy. EXAM: NUCLEAR MEDICINE PET SKULL BASE TO THIGH TECHNIQUE: 13.68 mCi F-18 FDG was injected intravenously. Full-ring PET imaging was performed from the skull base to thigh after the radiotracer. CT data was obtained and used for attenuation correction and anatomic localization. Fasting blood glucose: 96 mg/dl COMPARISON:  PET-CT 11/19/2022 FINDINGS: Mediastinal blood pool activity: SUV max 2.06 Liver activity: SUV max 4.13 NECK: No residual hypermetabolic adenopathy. The large bulk E right neck mass extending into the right subclavicular area has near completely resolved. Residual subcutaneous disease anterior to the right sternocleidomastoid muscle is noted. This measures 2.5 x 2.4 cm and previously measured 5 x 5 cm. SUV max is 3.12 and was previously 33.13. Incidental CT findings: None. CHEST: The substernal/upper right anterior mediastinal disease has resolved. No residual measurable tumor or hypermetabolism. The right axillary adenopathy the has resolved. No residual measurable disease or hypermetabolism. Incidental CT findings: Left IJ power port in good position without complicating features. No enlarged mediastinal or hilar lymph nodes. No worrisome pulmonary findings. ABDOMEN/PELVIS: No abnormal hypermetabolic activity within the liver, pancreas, adrenal glands, or spleen. No hypermetabolic lymph nodes in the abdomen or pelvis. Incidental CT findings: Thick wall bladder with pericystic interstitial/inflammatory changes could suggest cystitis. Recommend clinical correlation.  SKELETON: Diffuse marrow FDG uptake likely related to chemotherapy or marrow stimulating drugs. Incidental CT findings: None. IMPRESSION: 1. PET-CT findings suggest an excellent response to treatment ([place] 1). The large markedly hypermetabolic right nodal neck mass extending into the thoracic inlet and upper right anterior mediastinum has largely resolved. Only a small area of residual disease in the subcutaneous fat overlying the right sternocleidomastoid muscle remains (Deauville 3). 2. No disease below the diaphragm. 3. Mild diffuse marrow uptake likely due to chemotherapy or marrow stimulating drugs. 4. Thick wall bladder with pericystic interstitial/inflammatory changes could suggest cystitis. Electronically Signed   By: Marijo Sanes M.D.   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 Concerning for at least stage II possibly stage III disease. Some borderline lymphadenopathy in the abdomen could also be from his recently diagnosed HIV/AIDS. No clinical symptoms suggestive of CNS involvement at this time. Brain MRI negative.  No constitutional symptoms. No significant cytopenias to suggest bone marrow involvement. Discussed with patient and he is not interested in fertility preservation or sperm banking. First presented in August at [name] and was treated empirically with steroids and antibiotics with improvement in swelling prior to additional progression. -Currently on dose adjusted EPOCH-R. Status post 1 cycle. Tolerated well overall.  -Scheduled for cycle #2 on 12/10/22. Will help facilitate inpatient admission. -Outpatient follow up on 12/17/22 for Rituxan and Neulasta    #2 HIV/AIDS recently diagnosed 3 to 4 months ago. Following  Dr. Elna Breslow from [place] and currently taking Biktarvy.   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  Patient reports this was completely treated.   #4 Prophylaxis: Continue salt water and baking soda mouthwash rinses 4 times a day to reduce mucositis.  MiraLAX and senna as for constipation Bactrim for prophylactic PJP.    #5 Pancytopenia related to chemotherapy -- thrombocytopenia and neutropenia resolved. Still has normocytic anemia Cytopenias accentuated by his HIV/AIDS and related medications    PLAN: -Discussed lab results from today, 02/12/2023, with the patient.  -CBC with hemoglobin of 8.2 and normal platelets of 268 and WBC count of 3.8k CMP stable except slight bump in bilirubin to 1.3. -No indication for PRBC transfusion at this time unless hemoglobin is less than 7.5. -Patient notes no acute new toxicities and is okay to proceed with cycle 5-day 3 of EPOCH-R with adjustments of etoposide and Cytoxan doses. -He will be scheduled for Udenyca 02/15/2023 on Saturday. -outpatient Rituxan on 02/17/2023 --Recommended to stay well-hydrated. Drink at least 2L of water. -NACL/NAHCOS mouthwash QID -patient declined lovenox for VTE prophylaxis and was recommeded to ambulated and use SCD.  Logan KitchenThe total time spent in the appointment was 27 minutes* .  All of the patient's questions were answered with apparent satisfaction. The patient knows to call the clinic with any problems, questions or concerns.   Sullivan Lone MD MS AAHIVMS [facility] Hematology/Oncology Physician [facility]  .*Total Encounter Time as defined by the Centers for Medicare and Medicaid Services includes, in addition to the face-to-face time of a patient visit (documented in the note above) non-face-to-face time: obtaining and reviewing outside history, ordering and reviewing medications, tests or procedures, care coordination (communications with other  health care professionals or caregivers) and documentation in the medical record.

## 2023-02-14 NOTE — Discharge Summary (Signed)
. [place]  [phone] [phone]    Physician Discharge Summary     Patient ID: Logan Ward MRN: [id number] DOB/AGE: Oct 27, 1981 42 y.o.  Admit date: 02/10/2023 Discharge date: 02/14/2023  Primary Care Physician:  Patient, No Pcp Per  Discharge Diagnoses:    Present on Admission:  Burkitt lymphoma of lymph nodes of multiple regions [facility]) Encounter for chemotherapy C5 EPOCH-R  Discharge Medications:  Allergies as of 02/14/2023   No Known Allergies      Medication List     TAKE these medications    ascorbic acid 500 MG tablet Commonly known as: VITAMIN C Take 500 mg by mouth daily.   Biktarvy 50-200-25 MG Tabs tablet Generic drug: bictegravir-emtricitabine-tenofovir AF Take 1 tablet by mouth daily.   dexamethasone 4 MG tablet Commonly known as: DECADRON Take 1 tablet (4 mg total) by mouth 2 (two) times daily with breakfast and lunch for 2 days after completion of chemotherapy   levofloxacin 500 MG tablet Commonly known as: Levaquin Take 1 tablet (500 mg total) by mouth daily. Start on 02/18/2023   ondansetron 8 MG tablet Commonly known as: ZOFRAN Take 1 tablet (8 mg total) by mouth every 8 (eight) hours as needed for nausea.   prochlorperazine 10 MG tablet Commonly known as: COMPAZINE Take 1 tablet (10 mg total) by mouth every 6 (six) hours as needed.   senna-docusate 8.6-50 MG tablet Commonly known as: Senokot-S Take 2 tablets by mouth at bedtime as needed for mild constipation.   sulfamethoxazole-trimethoprim 800-160 MG tablet Commonly known as: BACTRIM DS Take 1 tablet by mouth daily.         Disposition and Follow-up:   Significant Diagnostic Studies:  DG Chest Port 1 View  Result Date: 01/31/2023 CLINICAL DATA:  Fevers.  Shortness of breath. EXAM: PORTABLE CHEST 1 VIEW COMPARISON:  01/10/2023 FINDINGS: There is a left chest wall port a catheter with tip at the cavoatrial junction. Heart size  appears within normal limits. Lung volumes are low. There is no pleural effusion or edema. No airspace opacities. Osseous structures appear intact. IMPRESSION: No acute cardiopulmonary abnormalities. Electronically Signed   By: Kerby Moors M.D.   On: 01/31/2023 15:34   NM PET Image Restag (PS) Skull Base To Thigh  Result Date: 01/18/2023 CLINICAL DATA:  Subsequent treatment strategy for Burkitt's lymphoma. Status post 3 cycles of chemo immunotherapy. EXAM: NUCLEAR MEDICINE PET SKULL BASE TO THIGH TECHNIQUE: 13.68 mCi F-18 FDG was injected intravenously. Full-ring PET imaging was performed from the skull base to thigh after the radiotracer. CT data was obtained and used for attenuation correction and anatomic localization. Fasting blood glucose: 96 mg/dl COMPARISON:  PET-CT 11/19/2022 FINDINGS: Mediastinal blood pool activity: SUV max 2.06 Liver activity: SUV max 4.13 NECK: No residual hypermetabolic adenopathy. The large bulk E right neck mass extending into the right subclavicular area has near completely resolved. Residual subcutaneous disease anterior to the right sternocleidomastoid muscle is noted. This measures 2.5 x 2.4 cm and previously measured 5 x 5 cm. SUV max is 3.12 and was previously 33.13. Incidental CT findings: None. CHEST: The substernal/upper right anterior mediastinal disease has resolved. No residual measurable tumor or hypermetabolism. The right axillary adenopathy the has resolved. No residual measurable disease or hypermetabolism. Incidental CT findings: Left IJ power port in good position without complicating features. No enlarged mediastinal or hilar lymph nodes. No worrisome pulmonary findings. ABDOMEN/PELVIS: No abnormal hypermetabolic activity within the liver, pancreas, adrenal glands, or spleen. No  hypermetabolic lymph nodes in the abdomen or pelvis. Incidental CT findings: Thick wall bladder with pericystic interstitial/inflammatory changes could suggest cystitis. Recommend  clinical correlation. SKELETON: Diffuse marrow FDG uptake likely related to chemotherapy or marrow stimulating drugs. Incidental CT findings: None. IMPRESSION: 1. PET-CT findings suggest an excellent response to treatment ([place] 1). The large markedly hypermetabolic right nodal neck mass extending into the thoracic inlet and upper right anterior mediastinum has largely resolved. Only a small area of residual disease in the subcutaneous fat overlying the right sternocleidomastoid muscle remains (Deauville 3). 2. No disease below the diaphragm. 3. Mild diffuse marrow uptake likely due to chemotherapy or marrow stimulating drugs. 4. Thick wall bladder with pericystic interstitial/inflammatory changes could suggest cystitis. Electronically Signed   By: Marijo Sanes M.D.   On: 01/18/2023 09:00    Discharge Laboratory Values: .    Latest Ref Rng & Units 02/14/2023    6:07 AM 02/13/2023    6:35 AM 02/12/2023    5:00 AM  CBC  WBC 4.0 - 10.5 K/uL 1.8  2.6  3.8   Hemoglobin 13.0 - 17.0 g/dL 8.3  8.4  8.2   Hematocrit 39.0 - 52.0 % 26.0  26.3  26.9   Platelets 150 - 400 K/uL 273  267  268    .    Latest Ref Rng & Units 02/14/2023    6:07 AM 02/13/2023    6:35 AM 02/12/2023    5:00 AM  CMP  Glucose 70 - 99 mg/dL 102  110  124   BUN 6 - 20 mg/dL '17  18  12   '$ Creatinine 0.61 - 1.24 mg/dL 0.84  0.86  0.91   Sodium 135 - 145 mmol/L 134  133  136   Potassium 3.5 - 5.1 mmol/L 3.6  3.8  3.9   Chloride 98 - 111 mmol/L 103  101  106   CO2 22 - 32 mmol/L '23  24  24   '$ Calcium 8.9 - 10.3 mg/dL 8.7  8.9  8.8   Total Protein 6.5 - 8.1 g/dL 6.5  6.4  6.4   Total Bilirubin 0.3 - 1.2 mg/dL 1.2  1.4  1.3   Alkaline Phos 38 - 126 U/L 35  34  37   AST 15 - 41 U/L '17  18  20   '$ ALT 0 - 44 U/L '17  19  22      '$ Brief H and P: For complete details please refer to admission H and P, but in brief, TERRIUS KIEBLER is a wonderful 42 y.o. male with a history of HIV/AIDS(CD count of 190 on 12/19/2022) on Biktarvy who is being  admitted for cycle 5 of inpatient dose adjusted EPOCH-R chemotherapy.   Patient was recently admitted for neutropenic fever and was noted to have UTI after C3 of chemotherapy. WBC counts bounced abck rapidly from G-CSF shot. Patient has since had a PET/CT which has shown excellent response to treatment of his Burkitts lymphoma.   He also again had neutropenic fever without a clear source of infection after C4 of chemo-immunotherapy and was admitted and then treatment empirically with cipro + flagyl.   He has had 2 doses of IT MTX for CNS prophylaxis but has declined further IT MTX.   He has been compliant with his HAART treatment, OI prophylaxis infection issues. No fevers no chills no night sweats no new lumps or bumps. Good p.o. intake and hydration. Urinary symptoms resolved.  We discussed that we shall be  dose reducing his etoposide and cytoxan to reduce risk of severe neutropenia and neutropenic fevers.  Issues during hospitalization   #1  Stage II recently diagnosed Burkitt's lymphoma Presenting with rapidly growing right neck mass extending into the chest, bilateral x-ray lymphadenopathy and also concern for possible involvement of retroperitoneal lymph nodes. Concerning for at least stage II possibly stage III disease. Some borderline lymphadenopathy in the abdomen could also be from his recently diagnosed HIV/AIDS. No clinical symptoms suggestive of CNS involvement at this time. Brain MRI negative.  No constitutional symptoms. No significant cytopenias to suggest bone marrow involvement. Discussed with patient and he is not interested in fertility preservation or sperm banking. First presented in August at [facility] and was treated empirically with steroids and antibiotics with improvement in swelling prior to additional progression.   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 Following Dr. Elna Breslow from [place] and currently taking Biktarvy.   #3 history of remote  syphilis 3 to 4 years ago .  Patient reports this was completely treated.   #4 Prophylaxis: Continue salt water and baking soda mouthwash rinses 4 times a day to reduce mucositis.  MiraLAX and senna as for constipation Bactrim for prophylactic PJP.    #5 Pancytopenia related to chemotherapy -- thrombocytopenia and neutropenia resolved. Still has normocytic anemia Cytopenias accentuated by his HIV/AIDS and related medications    Plan -Patient uneventfully received his cycle 5 of EPOCH as inpatient without any significant toxicities.  He had grade 1 nausea which was addressed with antiemetics.  No vomiting.  No significant mucositis. Has had some progressive pancytopenia due to his chemotherapy in addition to his other medications including Bactrim. -His labs are monitored closely.  He did not require any PRBC transfusions be monitored as outpatient and we will transfuse him if his hemoglobin drops below 7.5. -He has had issues with neutropenic fevers on the last 2 occasions after treatment and therefore we reduced his etoposide to 40 mg/m and Cytoxan to 500 mg/m. -We also scheduled his Udenyca earlier on Saturday at 1:30 PM and set up for Monday since he is starting to drop his WBC counts. -outpatient Rituxan on 02/17/2023 -Prescription was sent in for levofloxacin 500 mg p.o. daily for neutropenic prophylaxis starting from 02/18/2023 after his Rituxan to reduce risk of neutropenic fevers. -Will monitor his labs closely as outpatient and transfuse as needed. -Discussed labs from today. -Patient was ordered for Lovenox subcu for DVT prophylaxis but prefers not to have this. -Recommended to stay well-hydrated. Drink at least 2L of water. -Continue NACL/NAHCO3 mouthwash QID  Patient was discharged home in stable condition with strict neutropenic precautions due to likely neutropenia. He is to call us in the cancer center or come to the emergency room immediately if he develops any  fevers.   Physical Exam at Discharge: BP (!) 107/52 (BP Location: Left Arm)   Pulse 62   Temp 98.2 F (36.8 C) (Oral)   Resp 20   Ht '6\' 1"'$  (1.854 m)   Wt 272 lb 7.8 oz (123.6 kg)   SpO2 100%   BMI 35.95 kg/m   GENERAL:alert, in no acute distress and comfortable SKIN: no acute rashes, no significant lesions EYES: conjunctiva are pink and non-injected, sclera anicteric OROPHARYNX: MMM, no exudates, no oropharyngeal erythema or ulceration NECK: supple, no JVD LYMPH:  no palpable lymphadenopathy in the cervical, axillary or inguinal regions LUNGS: clear to auscultation b/l with normal respiratory effort HEART: regular rate & rhythm ABDOMEN:  normoactive bowel sounds , non tender, not distended. Extremity: no pedal edema PSYCH: alert & oriented x 3 with fluent speech NEURO: no focal motor/sensory deficits   Hospital Course:  Principal Problem:   Burkitt lymphoma of lymph nodes of multiple regions ([place])   Diet:  Regular with neutropenic precautions  Activity:  As tolerated. Neutropenic precautions  Condition at Discharge:   Stable  Signed: Dr. Sullivan Lone MD [place] [phone]  02/14/2023, 12:15 PM  TT>30 mins

## 2023-02-14 NOTE — Progress Notes (Signed)
Pt discharged to home. DC instructions given with mother at bedside. No concerns voiced. Left unit in wheelchair pushed by [place], [place]. Left in stable condition.

## 2023-02-14 NOTE — Progress Notes (Signed)
Logan Ward  HEMATOLOGY/ONCOLOGY INPATIENT PROGRESS NOTE  Date of Service: 02/13/2023  Inpatient Attending: .No att. providers found   SUBJECTIVE  Patient was seen in medical oncology follow-up for cycle 5-day 4 of EPOCH-R therapy. Notes no acute new concerns.  No uncontrolled nausea or vomiting.  Has some constipation but is taking senna. Good p.o. intake. Mild soreness but using mouthwash which helps. Labs done today were reviewed with him in detail.  OBJECTIVE:  NAD  PHYSICAL EXAMINATION: VS stable.Logan Ward GENERAL:alert, in no acute distress and comfortable OROPHARYNX: MMM, no exudates, no oropharyngeal erythema or ulceration NECK: supple, no JVD LYMPH:  no palpable lymphadenopathy in the cervical, axillary or inguinal regions LUNGS: clear to auscultation b/l with normal respiratory effort HEART: regular rate & rhythm ABDOMEN:  normoactive bowel sounds , non tender, not distended. Extremity: no pedal edema  MEDICAL HISTORY:  Past Medical History:  Diagnosis Date   Anal fissure    HIV infection ([place])    Internal hemorrhoids    Obesity    Rectal ulcer     SURGICAL HISTORY: Past Surgical History:  Procedure Laterality Date   IR IMAGING GUIDED PORT INSERTION  11/18/2022    SOCIAL HISTORY: Social History   Socioeconomic History   Marital status: Single    Spouse name: Not on file   Number of children: Not on file   Years of education: Not on file   Highest education level: Not on file  Occupational History   Not on file  Tobacco Use   Smoking status: Never   Smokeless tobacco: Never  Vaping Use   Vaping Use: Never used  Substance and Sexual Activity   Alcohol use: No   Drug use: No   Sexual activity: Yes    Comment: declined condoms  Other Topics Concern   Not on file  Social History Narrative   Not on file   Social Determinants of Health   Financial Resource Strain: Low Risk  (10/24/2022)   Overall Financial Resource Strain (CARDIA)    Difficulty of Paying  Living Expenses: Not hard at all  Food Insecurity: No Food Insecurity (02/10/2023)   Hunger Vital Sign    Worried About Running Out of Food in the Last Year: Never true    Ran Out of Food in the Last Year: Never true  Transportation Needs: No Transportation Needs (02/10/2023)   PRAPARE - Hydrologist (Medical): No    Lack of Transportation (Non-Medical): No  Physical Activity: Sufficiently Active (10/24/2022)   Exercise Vital Sign    Days of Exercise per Week: 3 days    Minutes of Exercise per Session: 60 min  Recent Concern: Physical Activity - Insufficiently Active (10/24/2022)   Exercise Vital Sign    Days of Exercise per Week: 3 days    Minutes of Exercise per Session: 20 min  Stress: No Stress Concern Present (10/24/2022)   [place]    Feeling of Stress : Not at all  Social Connections: Unknown (10/24/2022)   Social Connection and Isolation Panel [NHANES]    Frequency of Communication with Friends and Family: Twice a week    Frequency of Social Gatherings with Friends and Family: Twice a week    Attends Religious Services: 1 to 4 times per year    Active Member of [employer] or Organizations: No    Attends Archivist Meetings: 1 to 4 times per year    Marital Status: Patient refused  Intimate Partner Violence: Not At Risk (02/10/2023)   Humiliation, Afraid, Rape, and Kick questionnaire    Fear of Current or Ex-Partner: No    Emotionally Abused: No    Physically Abused: No    Sexually Abused: No    FAMILY HISTORY: Family History  Problem Relation Age of Onset   Hypertension Father    Throat cancer Father    Heart attack Brother    Colon cancer Neg Hx    Esophageal cancer Neg Hx    Stomach cancer Neg Hx     ALLERGIES:  has No Known Allergies.  MEDICATIONS:  Scheduled Meds:  bictegravir-emtricitabine-tenofovir AF  1 tablet Oral Daily   Chlorhexidine Gluconate Cloth  6  each Topical Daily   heparin lock flush  500 Units Intravenous Once   sodium bicarbonate/sodium chloride   Mouth Rinse QID   sulfamethoxazole-trimethoprim  1 tablet Oral Daily   Continuous Infusions:  sodium chloride 10 mL/hr at 02/14/23 0431   PRN Meds:.acetaminophen, alum & mag hydroxide-simeth, hydrocortisone, LORazepam, prochlorperazine, senna-docusate  REVIEW OF SYSTEMS:    .10 Point review of Systems was done is negative except as noted above.   LABORATORY DATA:  I have reviewed the data as listed  Component     Latest Ref Rng 02/13/2023  Sodium     135 - 145 mmol/L 133 (L)   Potassium     3.5 - 5.1 mmol/L 3.8   Chloride     98 - 111 mmol/L 101   CO2     22 - 32 mmol/L 24   Glucose     70 - 99 mg/dL 110 (H)   BUN     6 - 20 mg/dL 18   Creatinine     0.61 - 1.24 mg/dL 0.86   Calcium     8.9 - 10.3 mg/dL 8.9   Total Protein     6.5 - 8.1 g/dL 6.4 (L)   Albumin     3.5 - 5.0 g/dL 3.5   AST     15 - 41 U/L 18   ALT     0 - 44 U/L 19   Alkaline Phosphatase     38 - 126 U/L 34 (L)   Total Bilirubin     0.3 - 1.2 mg/dL 1.4 (H)   GFR, Estimated     >60 mL/min >60   Anion gap     5 - 15  8   WBC     4.0 - 10.5 K/uL 2.6 (L)   RBC     4.22 - 5.81 MIL/uL 2.96 (L)   Hemoglobin     13.0 - 17.0 g/dL 8.4 (L)   HCT     39.0 - 52.0 % 26.3 (L)   MCV     80.0 - 100.0 fL 88.9   MCH     26.0 - 34.0 pg 28.4   MCHC     30.0 - 36.0 g/dL 31.9   RDW     11.5 - 15.5 % 18.2 (H)   Platelets     150 - 400 K/uL 267   nRBC     0.0 - 0.2 % 0.0     Legend: (L) Low (H) High  RADIOGRAPHIC STUDIES: I have personally reviewed the radiological images as listed and agreed with the findings in the report. DG Chest Port 1 View  Result Date: 01/31/2023 CLINICAL DATA:  Fevers.  Shortness of breath. EXAM: PORTABLE CHEST 1 VIEW COMPARISON:  01/10/2023 FINDINGS: There  is a left chest wall port a catheter with tip at the cavoatrial junction. Heart size appears within normal  limits. Lung volumes are low. There is no pleural effusion or edema. No airspace opacities. Osseous structures appear intact. IMPRESSION: No acute cardiopulmonary abnormalities. Electronically Signed   By: Kerby Moors M.D.   On: 01/31/2023 15:34   NM PET Image Restag (PS) Skull Base To Thigh  Result Date: 01/18/2023 CLINICAL DATA:  Subsequent treatment strategy for Burkitt's lymphoma. Status post 3 cycles of chemo immunotherapy. EXAM: NUCLEAR MEDICINE PET SKULL BASE TO THIGH TECHNIQUE: 13.68 mCi F-18 FDG was injected intravenously. Full-ring PET imaging was performed from the skull base to thigh after the radiotracer. CT data was obtained and used for attenuation correction and anatomic localization. Fasting blood glucose: 96 mg/dl COMPARISON:  PET-CT 11/19/2022 FINDINGS: Mediastinal blood pool activity: SUV max 2.06 Liver activity: SUV max 4.13 NECK: No residual hypermetabolic adenopathy. The large bulk E right neck mass extending into the right subclavicular area has near completely resolved. Residual subcutaneous disease anterior to the right sternocleidomastoid muscle is noted. This measures 2.5 x 2.4 cm and previously measured 5 x 5 cm. SUV max is 3.12 and was previously 33.13. Incidental CT findings: None. CHEST: The substernal/upper right anterior mediastinal disease has resolved. No residual measurable tumor or hypermetabolism. The right axillary adenopathy the has resolved. No residual measurable disease or hypermetabolism. Incidental CT findings: Left IJ power port in good position without complicating features. No enlarged mediastinal or hilar lymph nodes. No worrisome pulmonary findings. ABDOMEN/PELVIS: No abnormal hypermetabolic activity within the liver, pancreas, adrenal glands, or spleen. No hypermetabolic lymph nodes in the abdomen or pelvis. Incidental CT findings: Thick wall bladder with pericystic interstitial/inflammatory changes could suggest cystitis. Recommend clinical correlation.  SKELETON: Diffuse marrow FDG uptake likely related to chemotherapy or marrow stimulating drugs. Incidental CT findings: None. IMPRESSION: 1. PET-CT findings suggest an excellent response to treatment ([place] 1). The large markedly hypermetabolic right nodal neck mass extending into the thoracic inlet and upper right anterior mediastinum has largely resolved. Only a small area of residual disease in the subcutaneous fat overlying the right sternocleidomastoid muscle remains ([place] 3). 2. No disease below the diaphragm. 3. Mild diffuse marrow uptake likely due to chemotherapy or marrow stimulating drugs. 4. Thick wall bladder with pericystic interstitial/inflammatory changes could suggest cystitis. Electronically Signed   By: Marijo Sanes M.D.   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 Concerning for at least stage II possibly stage III disease. Some borderline lymphadenopathy in the abdomen could also be from his recently diagnosed HIV/AIDS. No clinical symptoms suggestive of CNS involvement at this time. Brain MRI negative.  No constitutional symptoms. No significant cytopenias to suggest bone marrow involvement. Discussed with patient and he is not interested in fertility preservation or sperm banking. First presented in August at [facility] and was treated empirically with steroids and antibiotics with improvement in swelling prior to additional progression. -Currently on dose adjusted EPOCH-R. Status post 1 cycle. Tolerated well overall.  -Scheduled for cycle #2 on 12/10/22. Will help facilitate inpatient admission. -Outpatient follow up on 12/17/22 for Rituxan and Neulasta    #2 HIV/AIDS recently diagnosed 3 to 4 months ago. Following Dr.  Calone from [name] and currently taking Biktarvy.   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  Patient reports this was completely treated.   #4 Prophylaxis: Continue salt water and baking soda mouthwash rinses 4 times a day to reduce mucositis.  MiraLAX and senna as for constipation Bactrim for prophylactic PJP.    #5 Pancytopenia related to chemotherapy -- thrombocytopenia and neutropenia resolved. Still has normocytic anemia Cytopenias accentuated by his HIV/AIDS and related medications    PLAN: -Discussed lab results from today, 02/13/2023, with the patient.  -CBC and CMP stable -No indication for PRBC transfusion at this time unless hemoglobin is less than 7.5. -Patient notes no acute new toxicities and is okay to proceed with cycle 5-day 4 of EPOCH-R with adjustments of etoposide and Cytoxan doses. -Chemotherapy orders reviewed and signed -He is scheduled for Udenyca 02/15/2023 on Saturday at 1:30 PM. -outpatient Rituxan on 02/17/2023 --Recommended to stay well-hydrated. Drink at least 2L of water. -NACL/NAHCOS mouthwash QID -patient declined lovenox for VTE prophylaxis and was recommeded to ambulated and use SCD.  Logan KitchenThe total time spent in the appointment was 25 minutes* .  All of the patient's questions were answered with apparent satisfaction. The patient knows to call the clinic with any problems, questions or concerns.   Sullivan Lone MD MS AAHIVMS [name] Hematology/Oncology Physician [facility]  .*Total Encounter Time as defined by the Centers for Medicare and Medicaid Services includes, in addition to the face-to-face time of a patient visit (documented in the note above) non-face-to-face time: obtaining and reviewing outside history, ordering and reviewing medications, tests or procedures, care coordination (communications with other health care professionals or caregivers) and documentation in  the medical record.

## 2023-02-15 ENCOUNTER — Inpatient Hospital Stay: Payer: 59 | Attending: Hematology

## 2023-02-15 VITALS — BP 128/67 | HR 76 | Temp 99.1°F | Resp 17

## 2023-02-15 DIAGNOSIS — E883 Tumor lysis syndrome: Secondary | ICD-10-CM | POA: Diagnosis not present

## 2023-02-15 DIAGNOSIS — C8370 Burkitt lymphoma, unspecified site: Secondary | ICD-10-CM | POA: Diagnosis present

## 2023-02-15 DIAGNOSIS — Z79624 Long term (current) use of inhibitors of nucleotide synthesis: Secondary | ICD-10-CM | POA: Insufficient documentation

## 2023-02-15 DIAGNOSIS — Z79899 Other long term (current) drug therapy: Secondary | ICD-10-CM | POA: Insufficient documentation

## 2023-02-15 DIAGNOSIS — Z5112 Encounter for antineoplastic immunotherapy: Secondary | ICD-10-CM | POA: Insufficient documentation

## 2023-02-15 DIAGNOSIS — Z7189 Other specified counseling: Secondary | ICD-10-CM

## 2023-02-15 DIAGNOSIS — D696 Thrombocytopenia, unspecified: Secondary | ICD-10-CM | POA: Diagnosis not present

## 2023-02-15 DIAGNOSIS — B20 Human immunodeficiency virus [HIV] disease: Secondary | ICD-10-CM | POA: Diagnosis present

## 2023-02-15 DIAGNOSIS — Z5189 Encounter for other specified aftercare: Secondary | ICD-10-CM | POA: Insufficient documentation

## 2023-02-15 DIAGNOSIS — G4700 Insomnia, unspecified: Secondary | ICD-10-CM | POA: Diagnosis not present

## 2023-02-15 DIAGNOSIS — C8378 Burkitt lymphoma, lymph nodes of multiple sites: Secondary | ICD-10-CM

## 2023-02-15 MED ORDER — PEGFILGRASTIM-CBQV 6 MG/0.6ML ~~LOC~~ SOSY
6.0000 mg | PREFILLED_SYRINGE | Freq: Once | SUBCUTANEOUS | Status: AC
  Administered 2023-02-15: 6 mg via SUBCUTANEOUS

## 2023-02-17 ENCOUNTER — Inpatient Hospital Stay: Payer: 59

## 2023-02-17 ENCOUNTER — Inpatient Hospital Stay (HOSPITAL_BASED_OUTPATIENT_CLINIC_OR_DEPARTMENT_OTHER): Payer: 59 | Admitting: Hematology

## 2023-02-17 ENCOUNTER — Telehealth: Payer: Self-pay | Admitting: Hematology

## 2023-02-17 ENCOUNTER — Other Ambulatory Visit: Payer: Self-pay

## 2023-02-17 ENCOUNTER — Other Ambulatory Visit (HOSPITAL_COMMUNITY): Payer: Self-pay

## 2023-02-17 VITALS — BP 106/60 | HR 92 | Temp 98.6°F | Resp 20

## 2023-02-17 DIAGNOSIS — C8378 Burkitt lymphoma, lymph nodes of multiple sites: Secondary | ICD-10-CM

## 2023-02-17 DIAGNOSIS — Z5112 Encounter for antineoplastic immunotherapy: Secondary | ICD-10-CM | POA: Diagnosis not present

## 2023-02-17 DIAGNOSIS — Z7189 Other specified counseling: Secondary | ICD-10-CM

## 2023-02-17 LAB — CBC WITH DIFFERENTIAL (CANCER CENTER ONLY)
Abs Immature Granulocytes: 0.07 10*3/uL (ref 0.00–0.07)
Basophils Absolute: 0 10*3/uL (ref 0.0–0.1)
Basophils Relative: 1 %
Eosinophils Absolute: 0 10*3/uL (ref 0.0–0.5)
Eosinophils Relative: 0 %
HCT: 23.6 % — ABNORMAL LOW (ref 39.0–52.0)
Hemoglobin: 7.9 g/dL — ABNORMAL LOW (ref 13.0–17.0)
Immature Granulocytes: 6 %
Lymphocytes Relative: 21 %
Lymphs Abs: 0.2 10*3/uL — ABNORMAL LOW (ref 0.7–4.0)
MCH: 28.5 pg (ref 26.0–34.0)
MCHC: 33.5 g/dL (ref 30.0–36.0)
MCV: 85.2 fL (ref 80.0–100.0)
Monocytes Absolute: 0 10*3/uL — ABNORMAL LOW (ref 0.1–1.0)
Monocytes Relative: 3 %
Neutro Abs: 0.8 10*3/uL — ABNORMAL LOW (ref 1.7–7.7)
Neutrophils Relative %: 69 %
Platelet Count: 169 10*3/uL (ref 150–400)
RBC: 2.77 MIL/uL — ABNORMAL LOW (ref 4.22–5.81)
RDW: 17.1 % — ABNORMAL HIGH (ref 11.5–15.5)
Smear Review: NORMAL
WBC Count: 1.1 10*3/uL — ABNORMAL LOW (ref 4.0–10.5)
nRBC: 0 % (ref 0.0–0.2)

## 2023-02-17 LAB — CMP (CANCER CENTER ONLY)
ALT: 16 U/L (ref 0–44)
AST: 10 U/L — ABNORMAL LOW (ref 15–41)
Albumin: 3.8 g/dL (ref 3.5–5.0)
Alkaline Phosphatase: 49 U/L (ref 38–126)
Anion gap: 7 (ref 5–15)
BUN: 16 mg/dL (ref 6–20)
CO2: 27 mmol/L (ref 22–32)
Calcium: 8.4 mg/dL — ABNORMAL LOW (ref 8.9–10.3)
Chloride: 100 mmol/L (ref 98–111)
Creatinine: 0.83 mg/dL (ref 0.61–1.24)
GFR, Estimated: >60 mL/min (ref >60)
Glucose, Bld: 161 mg/dL — ABNORMAL HIGH (ref 70–99)
Potassium: 3.2 mmol/L — ABNORMAL LOW (ref 3.5–5.1)
Sodium: 134 mmol/L — ABNORMAL LOW (ref 135–145)
Total Bilirubin: 1 mg/dL (ref 0.3–1.2)
Total Protein: 6.2 g/dL — ABNORMAL LOW (ref 6.5–8.1)

## 2023-02-17 MED ORDER — FAMOTIDINE IN NACL 20-0.9 MG/50ML-% IV SOLN
20.0000 mg | Freq: Once | INTRAVENOUS | Status: AC
  Administered 2023-02-17: 20 mg via INTRAVENOUS
  Filled 2023-02-17: qty 50

## 2023-02-17 MED ORDER — SODIUM CHLORIDE 0.9 % IV SOLN: Freq: Once | INTRAVENOUS | Status: AC

## 2023-02-17 MED ORDER — DIPHENHYDRAMINE HCL 25 MG PO CAPS
50.0000 mg | ORAL_CAPSULE | Freq: Once | ORAL | Status: AC
  Administered 2023-02-17: 50 mg via ORAL
  Filled 2023-02-17: qty 2

## 2023-02-17 MED ORDER — SODIUM CHLORIDE 0.9 % IV SOLN
375.0000 mg/m2 | Freq: Once | INTRAVENOUS | Status: AC
  Administered 2023-02-17: 1000 mg via INTRAVENOUS
  Filled 2023-02-17: qty 100

## 2023-02-17 MED ORDER — METHYLPREDNISOLONE SODIUM SUCC 125 MG IJ SOLR
125.0000 mg | Freq: Once | INTRAMUSCULAR | Status: AC
  Administered 2023-02-17: 125 mg via INTRAVENOUS
  Filled 2023-02-17: qty 2

## 2023-02-17 MED ORDER — POTASSIUM CHLORIDE CRYS ER 20 MEQ PO TBCR
40.0000 meq | EXTENDED_RELEASE_TABLET | Freq: Once | ORAL | Status: AC
  Administered 2023-02-17: 40 meq via ORAL
  Filled 2023-02-17: qty 2

## 2023-02-17 MED ORDER — ACETAMINOPHEN 325 MG PO TABS
650.0000 mg | ORAL_TABLET | Freq: Once | ORAL | Status: AC
  Administered 2023-02-17: 650 mg via ORAL
  Filled 2023-02-17: qty 2

## 2023-02-17 MED ORDER — SODIUM CHLORIDE 0.9% FLUSH
10.0000 mL | Freq: Once | INTRAVENOUS | Status: AC
  Administered 2023-02-17: 10 mL

## 2023-02-17 MED ORDER — HEPARIN SOD (PORK) LOCK FLUSH 100 UNIT/ML IV SOLN
500.0000 [IU] | Freq: Once | INTRAVENOUS | Status: AC | PRN
  Administered 2023-02-17: 500 [IU]

## 2023-02-17 MED ORDER — SODIUM CHLORIDE 0.9% FLUSH
10.0000 mL | INTRAVENOUS | Status: DC | PRN
  Administered 2023-02-17: 10 mL
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
## 2023-02-17 NOTE — Patient Instructions (Signed)
[facility]  Discharge Instructions: Thank you for choosing [facility] to provide your oncology and hematology care.   If you have a lab appointment with the [facility], please go directly to the [facility] and check in at the registration area.   Wear comfortable clothing and clothing appropriate for easy access to any Portacath or PICC line.   We strive to give you quality time with your provider. You may need to reschedule your appointment if you arrive late (15 or more minutes).  Arriving late affects you and other patients whose appointments are after yours.  Also, if you miss three or more appointments without notifying the office, you may be dismissed from the clinic at the provider's discretion.      For prescription refill requests, have your pharmacy contact our office and allow 72 hours for refills to be completed.    Today you received the following chemotherapy and/or immunotherapy agent: Rituximab   To help prevent nausea and vomiting after your treatment, we encourage you to take your nausea medication as directed.  BELOW ARE SYMPTOMS THAT SHOULD BE REPORTED IMMEDIATELY: *FEVER GREATER THAN 100.4 F (38 C) OR HIGHER *CHILLS OR SWEATING *NAUSEA AND VOMITING THAT IS NOT CONTROLLED WITH YOUR NAUSEA MEDICATION *UNUSUAL SHORTNESS OF BREATH *UNUSUAL BRUISING OR BLEEDING *URINARY PROBLEMS (pain or burning when urinating, or frequent urination) *BOWEL PROBLEMS (unusual diarrhea, constipation, pain near the anus) TENDERNESS IN MOUTH AND THROAT WITH OR WITHOUT PRESENCE OF ULCERS (sore throat, sores in mouth, or a toothache) UNUSUAL RASH, SWELLING OR PAIN  UNUSUAL VAGINAL DISCHARGE OR ITCHING   Items with * indicate a potential emergency and should be followed up as soon as possible or go to the Emergency Department if any problems should occur.  Please show the CHEMOTHERAPY ALERT CARD or IMMUNOTHERAPY ALERT CARD at check-in  to the Emergency Department and triage nurse.  Should you have questions after your visit or need to cancel or reschedule your appointment, please contact [facility]  Dept: [phone]  and follow the prompts.  Office hours are 8:00 a.m. to 4:30 p.m. Monday - Friday. Please note that voicemails left after 4:00 p.m. may not be returned until the following business day.  We are closed weekends and major holidays. You have access to a nurse at all times for urgent questions. Please call the main number to the clinic Dept: [phone] and follow the prompts.   For any non-urgent questions, you may also contact your provider using MyChart. We now offer e-Visits for anyone 49 and older to request care online for non-urgent symptoms. For details visit [email or web address].   Also download the MyChart app! Go to the app store, search "MyChart", open the app, select [facility], and log in with your MyChart username and password.  Rituximab Injection What is this medication? RITUXIMAB (ri TUX i mab) treats leukemia and lymphoma. It works by blocking a protein that causes cancer cells to grow and multiply. This helps to slow or stop the spread of cancer cells. It may also be used to treat autoimmune conditions, such as arthritis. It works by slowing down an overactive immune system. It is a monoclonal antibody. This medicine may be used for other purposes; ask your health care provider or pharmacist if you have questions. COMMON BRAND NAME(S): RIABNI, Rituxan, RUXIENCE, truxima What should I tell my care team before I take this medication? They need to know if  you have any of these conditions: Chest pain Heart disease Immune system problems Infection, such as chickenpox, cold sores, hepatitis B, herpes Irregular heartbeat or rhythm Kidney disease Low blood counts, such as low white cells, platelets, red cells Lung disease Recent or upcoming vaccine An unusual  or allergic reaction to rituximab, other medications, foods, dyes, or preservatives Pregnant or trying to get pregnant Breast-feeding How should I use this medication? This medication is injected into a vein. It is given by a care team in a hospital or clinic setting. A special MedGuide will be given to you before each treatment. Be sure to read this information carefully each time. Talk to your care team about the use of this medication in children. While this medication may be prescribed for children as young as 6 months for selected conditions, precautions do apply. Overdosage: If you think you have taken too much of this medicine contact a poison control center or emergency room at once. NOTE: This medicine is only for you. Do not share this medicine with others. What if I miss a dose? Keep appointments for follow-up doses. It is important not to miss your dose. Call your care team if you are unable to keep an appointment. What may interact with this medication? Do not take this medication with any of the following: Live vaccines This medication may also interact with the following: Cisplatin This list may not describe all possible interactions. Give your health care provider a list of all the medicines, herbs, non-prescription drugs, or dietary supplements you use. Also tell them if you smoke, drink alcohol, or use illegal drugs. Some items may interact with your medicine. What should I watch for while using this medication? Your condition will be monitored carefully while you are receiving this medication. You may need blood work while taking this medication. This medication can cause serious infusion reactions. To reduce the risk your care team may give you other medications to take before receiving this one. Be sure to follow the directions from your care team. This medication may increase your risk of getting an infection. Call your care team for advice if you get a fever, chills, sore  throat, or other symptoms of a cold or flu. Do not treat yourself. Try to avoid being around people who are sick. Call your care team if you are around anyone with measles, chickenpox, or if you develop sores or blisters that do not heal properly. Avoid taking medications that contain aspirin, acetaminophen, ibuprofen, naproxen, or ketoprofen unless instructed by your care team. These medications may hide a fever. This medication may cause serious skin reactions. They can happen weeks to months after starting the medication. Contact your care team right away if you notice fevers or flu-like symptoms with a rash. The rash may be red or purple and then turn into blisters or peeling of the skin. You may also notice a red rash with swelling of the face, lips, or lymph nodes in your neck or under your arms. In some patients, this medication may cause a serious brain infection that may cause death. If you have any problems seeing, thinking, speaking, walking, or standing, tell your care team right away. If you cannot reach your care team, urgently seek another source of medical care. Talk to your care team if you may be pregnant. Serious birth defects can occur if you take this medication during pregnancy and for 12 months after the last dose. You will need a negative pregnancy test before starting  this medication. Contraception is recommended while taking this medication and for 12 months after the last dose. Your care team can help you find the option that works for you. Do not breastfeed while taking this medication and for at least 6 months after the last dose. What side effects may I notice from receiving this medication? Side effects that you should report to your care team as soon as possible: Allergic reactions or angioedema--skin rash, itching or hives, swelling of the face, eyes, lips, tongue, arms, or legs, trouble swallowing or breathing Bowel blockage--stomach cramping, unable to have a bowel  movement or pass gas, loss of appetite, vomiting Dizziness, loss of balance or coordination, confusion or trouble speaking Heart attack--pain or tightness in the chest, shoulders, arms, or jaw, nausea, shortness of breath, cold or clammy skin, feeling faint or lightheaded Heart rhythm changes--fast or irregular heartbeat, dizziness, feeling faint or lightheaded, chest pain, trouble breathing Infection--fever, chills, cough, sore throat, wounds that don't heal, pain or trouble when passing urine, general feeling of discomfort or being unwell Infusion reactions--chest pain, shortness of breath or trouble breathing, feeling faint or lightheaded Kidney injury--decrease in the amount of urine, swelling of the ankles, hands, or feet Liver injury--right upper belly pain, loss of appetite, nausea, light-colored stool, dark yellow or brown urine, yellowing skin or eyes, unusual weakness or fatigue Redness, blistering, peeling, or loosening of the skin, including inside the mouth Stomach pain that is severe, does not go away, or gets worse Tumor lysis syndrome (TLS)--nausea, vomiting, diarrhea, decrease in the amount of urine, dark urine, unusual weakness or fatigue, confusion, muscle pain or cramps, fast or irregular heartbeat, joint pain Side effects that usually do not require medical attention (report to your care team if they continue or are bothersome): Headache Joint pain Nausea Runny or stuffy nose Unusual weakness or fatigue This list may not describe all possible side effects. Call your doctor for medical advice about side effects. You may report side effects to FDA at 1-800-FDA-1088. Where should I keep my medication? This medication is given in a hospital or clinic. It will not be stored at home. NOTE: This sheet is a summary. It may not cover all possible information. If you have questions about this medicine, talk to your doctor, pharmacist, or health care provider.  2023 Elsevier/Gold  Standard (2022-04-16 00:00:00) Hypokalemia Hypokalemia means that the amount of potassium in the blood is lower than normal. Potassium is a mineral (electrolyte) that helps regulate the amount of fluid in the body. It also stimulates muscle tightening (contraction) and helps nerves work properly. Normally, most of the body's potassium is inside cells, and only a very small amount is in the blood. Because the amount in the blood is so small, minor changes to potassium levels in the blood can be life-threatening. What are the causes? This condition may be caused by: Antibiotic medicine. Diarrhea or vomiting. Taking too much of a medicine that helps you have a bowel movement (laxative) can cause diarrhea and lead to hypokalemia. Chronic kidney disease (CKD). Medicines that help the body get rid of excess fluid (diuretics). Eating disorders, such as anorexia or bulimia. Low magnesium levels in the body. Sweating a lot. What are the signs or symptoms? Symptoms of this condition include: Weakness. Constipation. Fatigue. Muscle cramps. Mental confusion. Skipped heartbeats or irregular heartbeat (palpitations). Tingling or numbness. How is this diagnosed? This condition is diagnosed with a blood test. How is this treated? This condition may be treated by: Taking potassium supplements.  Adjusting the medicines that you take. Eating more foods that contain a lot of potassium. If your potassium level is very low, you may need to get potassium through an IV and be monitored in the hospital. Follow these instructions at home: Eating and drinking  Eat a healthy diet. A healthy diet includes fresh fruits and vegetables, whole grains, healthy fats, and lean proteins. If told, eat more foods that contain a lot of potassium. These include: Nuts, such as peanuts and pistachios. Seeds, such as sunflower seeds and pumpkin seeds. Peas, lentils, and lima beans. Whole grain and bran cereals and  breads. Fresh fruits and vegetables, such as apricots, avocado, bananas, cantaloupe, kiwi, oranges, tomatoes, asparagus, and potatoes. Juices, such as orange, tomato, and prune. Lean meats, including fish. Milk and milk products, such as yogurt. General instructions Take over-the-counter and prescription medicines only as told by your health care provider. This includes vitamins, natural food products, and supplements. Keep all follow-up visits. This is important. Contact a health care provider if: You have weakness that gets worse. You feel your heart pounding or racing. You vomit. You have diarrhea. You have diabetes and you have trouble keeping your blood sugar in your target range. Get help right away if: You have chest pain. You have shortness of breath. You have vomiting or diarrhea that lasts for more than 2 days. You faint. These symptoms may be an emergency. Get help right away. Call 911. Do not wait to see if the symptoms will go away. Do not drive yourself to the hospital. Summary Hypokalemia means that the amount of potassium in the blood is lower than normal. This condition is diagnosed with a blood test. Hypokalemia may be treated by taking potassium supplements, adjusting the medicines that you take, or eating more foods that are high in potassium. If your potassium level is very low, you may need to get potassium through an IV and be monitored in the hospital. This information is not intended to replace advice given to you by your health care provider. Make sure you discuss any questions you have with your health care provider. Document Revised: 08/16/2021 Document Reviewed: 08/16/2021 Elsevier Patient Education  [place].

## 2023-02-17 NOTE — Progress Notes (Signed)
Per Dr. Irene Limbo, [facility] to treat with Hbg 7.9 and ANC 0.8

## 2023-02-17 NOTE — Progress Notes (Signed)
Patient seen by MD today  Vitals are within treatment parameters.  Labs reviewed: and are not all within treatment parameters. Dr Irene Limbo aware  Hgb: 7.9, ANC 0.8, K 3.2, NA 134,   Per physician team, patient is ready for treatment and there are NO modifications to the treatment plan.

## 2023-02-17 NOTE — Telephone Encounter (Signed)
Called patient per 3/4 los notes to schedule f/u appointments. Patient scheduled and notified.

## 2023-02-18 ENCOUNTER — Other Ambulatory Visit (HOSPITAL_COMMUNITY): Payer: Self-pay

## 2023-02-20 ENCOUNTER — Other Ambulatory Visit: Payer: Self-pay | Admitting: *Deleted

## 2023-02-20 DIAGNOSIS — C8378 Burkitt lymphoma, lymph nodes of multiple sites: Secondary | ICD-10-CM

## 2023-02-21 ENCOUNTER — Encounter (HOSPITAL_COMMUNITY): Payer: Self-pay | Admitting: Radiology

## 2023-02-21 ENCOUNTER — Emergency Department (HOSPITAL_COMMUNITY): Payer: 59

## 2023-02-21 ENCOUNTER — Inpatient Hospital Stay: Payer: 59

## 2023-02-21 ENCOUNTER — Inpatient Hospital Stay (HOSPITAL_COMMUNITY)
Admission: EM | Admit: 2023-02-21 | Discharge: 2023-02-23 | DRG: 809 | Disposition: A | Discharge status: Home / Self Care | Payer: 59 | Attending: Family Medicine | Admitting: Family Medicine

## 2023-02-21 ENCOUNTER — Other Ambulatory Visit: Payer: Self-pay

## 2023-02-21 DIAGNOSIS — Z8249 Family history of ischemic heart disease and other diseases of the circulatory system: Secondary | ICD-10-CM

## 2023-02-21 DIAGNOSIS — T50905A Adverse effect of unspecified drugs, medicaments and biological substances, initial encounter: Secondary | ICD-10-CM | POA: Diagnosis present

## 2023-02-21 DIAGNOSIS — Z5112 Encounter for antineoplastic immunotherapy: Secondary | ICD-10-CM | POA: Diagnosis not present

## 2023-02-21 DIAGNOSIS — G4700 Insomnia, unspecified: Secondary | ICD-10-CM | POA: Diagnosis not present

## 2023-02-21 DIAGNOSIS — Z79624 Long term (current) use of inhibitors of nucleotide synthesis: Secondary | ICD-10-CM | POA: Diagnosis not present

## 2023-02-21 DIAGNOSIS — Z5189 Encounter for other specified aftercare: Secondary | ICD-10-CM | POA: Diagnosis not present

## 2023-02-21 DIAGNOSIS — C8378 Burkitt lymphoma, lymph nodes of multiple sites: Secondary | ICD-10-CM | POA: Diagnosis present

## 2023-02-21 DIAGNOSIS — M5489 Other dorsalgia: Secondary | ICD-10-CM | POA: Diagnosis present

## 2023-02-21 DIAGNOSIS — M4807 Spinal stenosis, lumbosacral region: Secondary | ICD-10-CM | POA: Diagnosis present

## 2023-02-21 DIAGNOSIS — B20 Human immunodeficiency virus [HIV] disease: Secondary | ICD-10-CM | POA: Diagnosis present

## 2023-02-21 DIAGNOSIS — C8370 Burkitt lymphoma, unspecified site: Secondary | ICD-10-CM | POA: Diagnosis present

## 2023-02-21 DIAGNOSIS — Z79899 Other long term (current) drug therapy: Secondary | ICD-10-CM

## 2023-02-21 DIAGNOSIS — Z7952 Long term (current) use of systemic steroids: Secondary | ICD-10-CM

## 2023-02-21 DIAGNOSIS — M549 Dorsalgia, unspecified: Secondary | ICD-10-CM | POA: Diagnosis present

## 2023-02-21 DIAGNOSIS — D696 Thrombocytopenia, unspecified: Secondary | ICD-10-CM | POA: Diagnosis not present

## 2023-02-21 DIAGNOSIS — D61810 Antineoplastic chemotherapy induced pancytopenia: Principal | ICD-10-CM | POA: Diagnosis present

## 2023-02-21 DIAGNOSIS — I2510 Atherosclerotic heart disease of native coronary artery without angina pectoris: Secondary | ICD-10-CM | POA: Diagnosis present

## 2023-02-21 DIAGNOSIS — E871 Hypo-osmolality and hyponatremia: Secondary | ICD-10-CM | POA: Diagnosis present

## 2023-02-21 DIAGNOSIS — D61818 Other pancytopenia: Secondary | ICD-10-CM

## 2023-02-21 DIAGNOSIS — M545 Low back pain, unspecified: Principal | ICD-10-CM

## 2023-02-21 DIAGNOSIS — E883 Tumor lysis syndrome: Secondary | ICD-10-CM | POA: Diagnosis not present

## 2023-02-21 DIAGNOSIS — K5903 Drug induced constipation: Secondary | ICD-10-CM | POA: Diagnosis present

## 2023-02-21 DIAGNOSIS — Z87442 Personal history of urinary calculi: Secondary | ICD-10-CM

## 2023-02-21 DIAGNOSIS — T451X5A Adverse effect of antineoplastic and immunosuppressive drugs, initial encounter: Secondary | ICD-10-CM | POA: Diagnosis present

## 2023-02-21 DIAGNOSIS — Z808 Family history of malignant neoplasm of other organs or systems: Secondary | ICD-10-CM

## 2023-02-21 DIAGNOSIS — E872 Acidosis, unspecified: Secondary | ICD-10-CM | POA: Diagnosis present

## 2023-02-21 LAB — COMPREHENSIVE METABOLIC PANEL
ALT: 23 U/L (ref 0–44)
AST: 29 U/L (ref 15–41)
Albumin: 3.9 g/dL (ref 3.5–5.0)
Alkaline Phosphatase: 45 U/L (ref 38–126)
Anion gap: 10 (ref 5–15)
BUN: 14 mg/dL (ref 6–20)
CO2: 25 mmol/L (ref 22–32)
Calcium: 8.9 mg/dL (ref 8.9–10.3)
Chloride: 99 mmol/L (ref 98–111)
Creatinine, Ser: 0.99 mg/dL (ref 0.61–1.24)
GFR, Estimated: >60 mL/min (ref >60)
Glucose, Bld: 114 mg/dL — ABNORMAL HIGH (ref 70–99)
Potassium: 3.7 mmol/L (ref 3.5–5.1)
Sodium: 134 mmol/L — ABNORMAL LOW (ref 135–145)
Total Bilirubin: 0.9 mg/dL (ref 0.3–1.2)
Total Protein: 6.6 g/dL (ref 6.5–8.1)

## 2023-02-21 LAB — CMP (CANCER CENTER ONLY)
ALT: 20 U/L (ref 0–44)
AST: 19 U/L (ref 15–41)
Albumin: 4 g/dL (ref 3.5–5.0)
Alkaline Phosphatase: 49 U/L (ref 38–126)
Anion gap: 8 (ref 5–15)
BUN: 14 mg/dL (ref 6–20)
CO2: 26 mmol/L (ref 22–32)
Calcium: 9.3 mg/dL (ref 8.9–10.3)
Chloride: 100 mmol/L (ref 98–111)
Creatinine: 1 mg/dL (ref 0.61–1.24)
GFR, Estimated: >60 mL/min (ref >60)
Glucose, Bld: 109 mg/dL — ABNORMAL HIGH (ref 70–99)
Potassium: 3.8 mmol/L (ref 3.5–5.1)
Sodium: 134 mmol/L — ABNORMAL LOW (ref 135–145)
Total Bilirubin: 0.8 mg/dL (ref 0.3–1.2)
Total Protein: 6.5 g/dL (ref 6.5–8.1)

## 2023-02-21 LAB — SAMPLE TO BLOOD BANK

## 2023-02-21 LAB — URINALYSIS, ROUTINE W REFLEX MICROSCOPIC
Bilirubin Urine: NEGATIVE
Glucose, UA: NEGATIVE mg/dL
Hgb urine dipstick: NEGATIVE
Ketones, ur: NEGATIVE mg/dL
Leukocytes,Ua: NEGATIVE
Nitrite: NEGATIVE
Protein, ur: NEGATIVE mg/dL
Specific Gravity, Urine: 1.024 (ref 1.005–1.030)
pH: 5 (ref 5.0–8.0)

## 2023-02-21 LAB — CBC WITH DIFFERENTIAL/PLATELET
Abs Immature Granulocytes: 0.04 10*3/uL (ref 0.00–0.07)
Basophils Absolute: 0 10*3/uL (ref 0.0–0.1)
Basophils Relative: 3 %
Eosinophils Absolute: 0 10*3/uL (ref 0.0–0.5)
Eosinophils Relative: 0 %
HCT: 23.4 % — ABNORMAL LOW (ref 39.0–52.0)
Hemoglobin: 7.3 g/dL — ABNORMAL LOW (ref 13.0–17.0)
Immature Granulocytes: 4 %
Lymphocytes Relative: 41 %
Lymphs Abs: 0.4 10*3/uL — ABNORMAL LOW (ref 0.7–4.0)
MCH: 27.8 pg (ref 26.0–34.0)
MCHC: 31.2 g/dL (ref 30.0–36.0)
MCV: 89 fL (ref 80.0–100.0)
Monocytes Absolute: 0.5 10*3/uL (ref 0.1–1.0)
Monocytes Relative: 44 %
Neutro Abs: 0.1 10*3/uL — CL (ref 1.7–7.7)
Neutrophils Relative %: 8 %
Platelets: 38 10*3/uL — ABNORMAL LOW (ref 150–400)
RBC: 2.63 MIL/uL — ABNORMAL LOW (ref 4.22–5.81)
RDW: 17.2 % — ABNORMAL HIGH (ref 11.5–15.5)
WBC: 1 10*3/uL — CL (ref 4.0–10.5)
nRBC: 19.8 % — ABNORMAL HIGH (ref 0.0–0.2)

## 2023-02-21 LAB — CBC WITH DIFFERENTIAL (CANCER CENTER ONLY)
Abs Immature Granulocytes: 0.07 10*3/uL (ref 0.00–0.07)
Basophils Absolute: 0 10*3/uL (ref 0.0–0.1)
Basophils Relative: 2 %
Eosinophils Absolute: 0 10*3/uL (ref 0.0–0.5)
Eosinophils Relative: 0 %
HCT: 22.9 % — ABNORMAL LOW (ref 39.0–52.0)
Hemoglobin: 7.6 g/dL — ABNORMAL LOW (ref 13.0–17.0)
Immature Granulocytes: 6 %
Lymphocytes Relative: 40 %
Lymphs Abs: 0.5 10*3/uL — ABNORMAL LOW (ref 0.7–4.0)
MCH: 28.8 pg (ref 26.0–34.0)
MCHC: 33.2 g/dL (ref 30.0–36.0)
MCV: 86.7 fL (ref 80.0–100.0)
Monocytes Absolute: 0.5 10*3/uL (ref 0.1–1.0)
Monocytes Relative: 42 %
Neutro Abs: 0.1 10*3/uL — CL (ref 1.7–7.7)
Neutrophils Relative %: 10 %
Platelet Count: 40 10*3/uL — ABNORMAL LOW (ref 150–400)
RBC: 2.64 MIL/uL — ABNORMAL LOW (ref 4.22–5.81)
RDW: 17.2 % — ABNORMAL HIGH (ref 11.5–15.5)
WBC Count: 1.1 10*3/uL — ABNORMAL LOW (ref 4.0–10.5)
nRBC: 17.5 % — ABNORMAL HIGH (ref 0.0–0.2)

## 2023-02-21 LAB — LACTIC ACID, PLASMA
Lactic Acid, Venous: 3.5 mmol/L (ref 0.5–1.9)
Lactic Acid, Venous: 3.1 mmol/L (ref 0.5–1.9)

## 2023-02-21 LAB — PREPARE RBC (CROSSMATCH)

## 2023-02-21 MED ORDER — HYDROMORPHONE HCL 1 MG/ML IJ SOLN
1.0000 mg | Freq: Once | INTRAMUSCULAR | Status: AC
  Administered 2023-02-21: 1 mg via INTRAVENOUS
  Filled 2023-02-21: qty 1

## 2023-02-21 MED ORDER — SODIUM CHLORIDE 0.9% FLUSH
3.0000 mL | Freq: Two times a day (BID) | INTRAVENOUS | Status: DC
  Administered 2023-02-21 – 2023-02-22 (×2): 3 mL via INTRAVENOUS

## 2023-02-21 MED ORDER — LIDOCAINE 5 % EX PTCH
1.0000 | MEDICATED_PATCH | CUTANEOUS | Status: DC
  Administered 2023-02-21: 1 via TRANSDERMAL
  Filled 2023-02-21 (×3): qty 1

## 2023-02-21 MED ORDER — HYDROMORPHONE HCL 1 MG/ML IJ SOLN
0.5000 mg | INTRAMUSCULAR | Status: DC | PRN
  Administered 2023-02-21 – 2023-02-22 (×5): 1 mg via INTRAVENOUS
  Filled 2023-02-21 (×5): qty 1

## 2023-02-21 MED ORDER — SODIUM CHLORIDE 0.9% IV SOLUTION: Freq: Once | INTRAVENOUS | Status: AC

## 2023-02-21 MED ORDER — ONDANSETRON HCL 4 MG PO TABS: 4.0000 mg | ORAL_TABLET | Freq: Four times a day (QID) | ORAL | Status: DC | PRN

## 2023-02-21 MED ORDER — CHLORHEXIDINE GLUCONATE CLOTH 2 % EX PADS: 6.0000 | MEDICATED_PAD | Freq: Every day | CUTANEOUS | Status: DC

## 2023-02-21 MED ORDER — MAGIC MOUTHWASH
5.0000 mL | Freq: Three times a day (TID) | ORAL | Status: DC | PRN
  Administered 2023-02-22: 5 mL via ORAL
  Filled 2023-02-21 (×2): qty 5

## 2023-02-21 MED ORDER — SODIUM CHLORIDE 0.9 % IV SOLN: INTRAVENOUS | Status: DC

## 2023-02-21 MED ORDER — SODIUM CHLORIDE 0.9 % IV SOLN: 250.0000 mL | INTRAVENOUS | Status: DC | PRN

## 2023-02-21 MED ORDER — SODIUM CHLORIDE 0.9% FLUSH
10.0000 mL | Freq: Once | INTRAVENOUS | Status: AC
  Administered 2023-02-21: 10 mL

## 2023-02-21 MED ORDER — SULFAMETHOXAZOLE-TRIMETHOPRIM 800-160 MG PO TABS
1.0000 | ORAL_TABLET | Freq: Every day | ORAL | Status: DC
  Administered 2023-02-21 – 2023-02-23 (×3): 1 via ORAL
  Filled 2023-02-21 (×3): qty 1

## 2023-02-21 MED ORDER — ACETAMINOPHEN 650 MG RE SUPP: 650.0000 mg | Freq: Four times a day (QID) | RECTAL | Status: DC | PRN

## 2023-02-21 MED ORDER — ACETAMINOPHEN 325 MG PO TABS
650.0000 mg | ORAL_TABLET | Freq: Four times a day (QID) | ORAL | Status: DC | PRN
  Administered 2023-02-21 – 2023-02-22 (×2): 650 mg via ORAL
  Filled 2023-02-21 (×2): qty 2

## 2023-02-21 MED ORDER — SODIUM CHLORIDE 0.9% FLUSH: 3.0000 mL | INTRAVENOUS | Status: DC | PRN

## 2023-02-21 MED ORDER — SENNOSIDES-DOCUSATE SODIUM 8.6-50 MG PO TABS: 2.0000 | ORAL_TABLET | Freq: Every evening | ORAL | Status: DC | PRN

## 2023-02-21 MED ORDER — GADOBUTROL 1 MMOL/ML IV SOLN
10.0000 mL | Freq: Once | INTRAVENOUS | Status: AC | PRN
  Administered 2023-02-21: 10 mL via INTRAVENOUS

## 2023-02-21 MED ORDER — OXYCODONE HCL 5 MG PO TABS
5.0000 mg | ORAL_TABLET | ORAL | Status: DC | PRN
  Administered 2023-02-22: 5 mg via ORAL
  Filled 2023-02-21 (×3): qty 1

## 2023-02-21 MED ORDER — SODIUM CHLORIDE 0.9 % IV BOLUS
250.0000 mL | Freq: Once | INTRAVENOUS | Status: AC
  Administered 2023-02-21: 250 mL via INTRAVENOUS

## 2023-02-21 MED ORDER — ONDANSETRON HCL 4 MG/2ML IJ SOLN
4.0000 mg | Freq: Once | INTRAMUSCULAR | Status: DC
  Filled 2023-02-21: qty 2

## 2023-02-21 MED ORDER — METHOCARBAMOL 1000 MG/10ML IJ SOLN
500.0000 mg | Freq: Four times a day (QID) | INTRAVENOUS | Status: DC | PRN
  Filled 2023-02-21: qty 5

## 2023-02-21 MED ORDER — ONDANSETRON HCL 4 MG/2ML IJ SOLN: 4.0000 mg | Freq: Four times a day (QID) | INTRAMUSCULAR | Status: DC | PRN

## 2023-02-21 MED ORDER — SODIUM CHLORIDE 0.9 % IV SOLN
2.0000 g | Freq: Three times a day (TID) | INTRAVENOUS | Status: DC
  Administered 2023-02-21 – 2023-02-23 (×5): 2 g via INTRAVENOUS
  Filled 2023-02-21 (×6): qty 12.5

## 2023-02-21 MED ORDER — VITAMIN C 500 MG PO TABS
500.0000 mg | ORAL_TABLET | Freq: Every day | ORAL | Status: DC
  Administered 2023-02-21 – 2023-02-23 (×3): 500 mg via ORAL
  Filled 2023-02-21 (×3): qty 1

## 2023-02-21 MED ORDER — BICTEGRAVIR-EMTRICITAB-TENOFOV 50-200-25 MG PO TABS
1.0000 | ORAL_TABLET | Freq: Every day | ORAL | Status: DC
  Administered 2023-02-21 – 2023-02-23 (×3): 1 via ORAL
  Filled 2023-02-21 (×3): qty 1

## 2023-02-21 NOTE — Assessment & Plan Note (Addendum)
Transfused 1 unit 3/8 Counts improving today - COnsult Oncology

## 2023-02-21 NOTE — ED Triage Notes (Signed)
Here by POV with family from home via [facility], here for back pain, onset 0200, denies sx other than pain, restless and writhing in pain, EDP into room during triage. Pt alert, interactive.

## 2023-02-21 NOTE — Assessment & Plan Note (Signed)
Resolved with fluids, doubt sepsis

## 2023-02-21 NOTE — Assessment & Plan Note (Addendum)
chemotherapy EPOCH-R  Retuximab given 3/4 Last chemo was 2/26-3/1 Next session on 3/18  Oncology consulted

## 2023-02-21 NOTE — Assessment & Plan Note (Signed)
Continue Biktarvy and bactrim DS

## 2023-02-21 NOTE — H&P (Signed)
History and Physical    Patient: Logan Ward [id number] DOB: March 17, 1981 DOA: 02/21/2023 DOS: the patient was seen and examined on 02/21/2023 PCP: Patient, No Pcp Per  Patient coming from: Home - lives with  alone. Ambulates independently.    Chief Complaint: back pain   HPI: [name] is a 42 y.o. 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 He states pain started acutely around 2AM. Woke him up from sleeping. Pain is in the lower back and radiates from middle laterally on both sides. Pain a 10/10 and described as throbbing. Pain constant. Nothing makes it better. Movement makes it worse.  He took a pain medication he had and it did help for about an hour. He had had no problems urinating or urinary complaints.  He has had no fever at home. No precipitating events.   He has been feeling good. Denies any fever/chills, vision changes/headaches, chest pain or palpitations, + shortness of breath or cough, abdominal pain, N/V/D, dysuria or leg swelling.    He does not smoke or drink.   ER Course:  vitals: afebrile, bp: 122/92, HR: 118, RR: 22, oxygen: 94% RA Pertinent labs: WBC: 1.1, hgb: 7.6, platelets 40, lactic acid: 3.5>3.1, UA clear MRI lumbar spine with and without contrast: . No suspicious marrow signal abnormality or abnormal enhancement. Mild degenerative change at L4-L5 and L5-S1 with mild-to-moderate right and mild left neural foraminal stenosis at L5-S1 CT renal stone study: There is no evidence of intestinal obstruction or pneumoperitoneum. There is no hydronephrosis. Appendix is not dilated.  Coronary artery calcifications are seen. In ED: given multiple doses of dilaudid and still having pain. Asked to admit for pain control.     Review of Systems: As mentioned in the history of present illness. All other systems reviewed and are negative. Past Medical History:   Diagnosis Date   Anal fissure    HIV infection ([place])    Internal hemorrhoids    Obesity    Rectal ulcer    Past Surgical History:  Procedure Laterality Date   IR IMAGING GUIDED PORT INSERTION  11/18/2022   Social History:  reports that he has never smoked. He has never used smokeless tobacco. He reports that he does not drink alcohol and does not use drugs.  No Known Allergies  Family History  Problem Relation Age of Onset   Hypertension Father    Throat cancer Father    Heart attack Brother    Colon cancer Neg Hx    Esophageal cancer Neg Hx    Stomach cancer Neg Hx     Prior to Admission medications   Medication Sig Start Date End Date Taking? Authorizing Provider  ascorbic acid (VITAMIN C) 500 MG tablet Take 500 mg by mouth daily.    [provider]  bictegravir-emtricitabine-tenofovir AF (BIKTARVY) 50-200-25 MG TABS tablet Take 1 tablet by mouth daily. 12/19/22   Golden Circle, FNP  dexamethasone (DECADRON) 4 MG tablet Take 1 tablet (4 mg total) by mouth 2 (two) times daily with breakfast and lunch for 2 days after completion of chemotherapy 02/14/23   Brunetta Genera, MD  levofloxacin (LEVAQUIN) 500 MG tablet Take 1 tablet (500 mg total) by mouth daily. Start on 02/18/2023 02/14/23   Brunetta Genera, MD  ondansetron (ZOFRAN) 8 MG tablet Take 1 tablet (8 mg total) by mouth every 8 (eight) hours as needed  for nausea. Patient not taking: Reported on 01/31/2023 11/22/22   Brunetta Genera, MD  prochlorperazine (COMPAZINE) 10 MG tablet Take 1 tablet (10 mg total) by mouth every 6 (six) hours as needed. Patient not taking: Reported on 01/31/2023 12/02/22   Heilingoetter, Cassandra L, PA-C  senna-docusate (SENOKOT-S) 8.6-50 MG tablet Take 2 tablets by mouth at bedtime as needed for mild constipation. 02/14/23   Brunetta Genera, MD  sulfamethoxazole-trimethoprim (BACTRIM DS) 800-160 MG tablet Take 1 tablet by mouth daily. Patient not taking: Reported on 02/10/2023  02/03/23   Mckinley Jewel, MD    Physical Exam: Vitals:   02/21/23 0848 02/21/23 0853 02/21/23 1302 02/21/23 1712  BP: (!) 129/95  (!) 122/92 138/83  Pulse: (!) 110  (!) 118 (!) 114  Resp: 18  (!) 22 (!) 22  Temp: 98 F (36.7 C)  99.2 F (37.3 C) 99.5 F (37.5 C)  TempSrc: Oral  Oral Oral  SpO2: 100%  94% 98%  Weight:  123.4 kg  120.7 kg  Height:    '6\' 1"'$  (1.854 m)   General:  Appears calm and comfortable and is in NAD Eyes:  PERRL, EOMI, normal lids, iris ENT:  grossly normal hearing, lips & tongue, dry mucous membranes; appropriate dentition Neck:  no LAD, masses or thyromegaly; no carotid bruits Cardiovascular:  RRR, no m/r/g. No LE edema.  Respiratory:   CTA bilaterally with no wheezes/rales/rhonchi.  Normal respiratory effort. Abdomen:  soft, NT, ND, NABS Back:   normal alignment TTP in bilateral SI joints.  Skin:  no rash or induration seen on limited exam Musculoskeletal:  grossly normal tone BUE/BLE, good ROM, no bony abnormality Lower extremity:  No LE edema.  Limited foot exam with no ulcerations.  2+ distal pulses. Psychiatric:  grossly normal mood and affect, speech fluent and appropriate, AOx3 Neurologic:  CN 2-12 grossly intact, moves all extremities in coordinated fashion, sensation intact   Radiological Exams on Admission: Independently reviewed - see discussion in A/P where applicable  CT Renal Stone Study  Result Date: 02/21/2023 CLINICAL DATA:  Abdominal pain, back pain EXAM: CT ABDOMEN AND PELVIS WITHOUT CONTRAST TECHNIQUE: Multidetector CT imaging of the abdomen and pelvis was performed following the standard protocol without IV contrast. RADIATION DOSE REDUCTION: This exam was performed according to the departmental dose-optimization program which includes automated exposure control, adjustment of the mA and/or kV according to patient size and/or use of iterative reconstruction technique. COMPARISON:  11/10/2022 FINDINGS: Lower chest: There is no focal  consolidation in the lower lung fields. Coronary artery calcifications are seen. Hepatobiliary: No focal abnormalities are seen in liver. There is no dilation of bile ducts. Gallbladder is unremarkable. Pancreas: No focal abnormalities are seen. Spleen: Unremarkable. Adrenals/Urinary Tract: Adrenals are unremarkable. There is no hydronephrosis. There are no renal or ureteral stones. There is increased density in the lumen of urinary bladder, possibly related to previously administered contrast. Stomach/Bowel: Stomach is not distended. Small bowel loops are not dilated. Appendix is not dilated. There is no significant wall thickening in colon. There is no pericolic stranding. Vascular/Lymphatic: There are scattered subcentimeter nodes in mesentery and retroperitoneum which appear less prominent. No new significant lymphadenopathy is seen. Vascular structures are unremarkable. Reproductive: Unremarkable. Other: There is no ascites or pneumoperitoneum. Small umbilical hernia containing fat is seen. Bilateral inguinal hernias containing fat are noted, larger on the left side. Musculoskeletal: No acute findings are seen. IMPRESSION: There is no evidence of intestinal obstruction or pneumoperitoneum. There is no hydronephrosis. Appendix  is not dilated. Coronary artery calcifications are seen. Electronically Signed   By: Elmer Picker M.D.   On: 02/21/2023 15:50   MR Lumbar Spine W Wo Contrast  Result Date: 02/21/2023 CLINICAL DATA:  Low back pain, weakness in both legs. History of Burkitt's lymphoma and HIV. EXAM: MRI LUMBAR SPINE WITHOUT AND WITH CONTRAST TECHNIQUE: Multiplanar and multiecho pulse sequences of the lumbar spine were obtained without and with intravenous contrast. CONTRAST:  20m GADAVIST GADOBUTROL 1 MMOL/ML IV SOLN COMPARISON:  PET-CT 01/17/2023 FINDINGS: Segmentation: Standard; the lowest formed disc space is designated L5-S1. Alignment:  Normal. Vertebrae: Vertebral body heights are preserved.  Background marrow signal is normal. There is no suspicious marrow signal abnormality, marrow edema, or marrow enhancement. Conus medullaris and cauda equina: Conus extends to the L1 level. Conus and cauda equina appear normal. There is no abnormal enhancement of the cauda equina nerve roots. Paraspinal and other soft tissues: Unremarkable. Disc levels: There is disc desiccation without significant loss of height at L5-S1. The other disc spaces are preserved T12-L1: Unremarkable L1-L2: Unremarkable. L2-L3: Unremarkable. L3-L4: Unremarkable. L4-L5: There is a mild disc bulge and minimal facet arthropathy resulting in mild right worse than left neural foraminal stenosis without significant spinal canal stenosis L5-S1: There is a disc bulge with a central annular fissure, mild degenerative endplate spurring, and mild facet arthropathy resulting in mild to moderate right and mild left neural foraminal stenosis without significant spinal canal stenosis. IMPRESSION: 1. No suspicious marrow signal abnormality or abnormal enhancement. 2. Mild degenerative change at L4-L5 and L5-S1 with mild-to-moderate right and mild left neural foraminal stenosis at L5-S1. Electronically Signed   By: PValetta MoleM.D.   On: 02/21/2023 11:34     Labs on Admission: I have personally reviewed the available labs and imaging studies at the time of the admission.  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)   Drug-induced constipation    Assessment and Plan: * Intractable back pain 42year old male presenting with 1 day history of acute onset of low back pain that is intractable despite multiple doses of IV pain medication   -obs to med surg -lumbar MRI w/wout contrast negative for any acute finding -CT stone protocol with no acute finding -exam with TTP in bilateral SI joints -no precipitating factors  -discussed with his oncologist and possibly the granix working -pain control with lidocaine patch, robaxin PRN, oxycodone and dilaudid  -transfuse 1 unit PRBC   Pancytopenia ([facility] Likely secondary to chemo treatment Talked to Dr. KIrene Limboand recommended transfuse to keep hgb >7.5, will give 1 unit PRBC now  ANC 0.1, but remains afebrile If fever will culture, start empiric abx  Oncology consulted   Thrombocytopenia ([facility] History of thrombocytopenia after chemo in 2/24 with lowest at 356Likely secondary to chemo treatment Monitor closely, transfuse platelets if <10K or bleeding   Lactic acidosis Malignancy vs. Hypoperfusion vs. 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 Lindi Adie   DVT Prophylaxis: SCDs  Family Communication: mother at bedside   Severity of Illness: The appropriate patient status for this patient is OBSERVATION. Observation status is judged to be reasonable and necessary in order to provide the required intensity of service to ensure the patient's safety. The patient's presenting symptoms, physical exam findings, and initial radiographic and laboratory data in the context of their medical condition is felt to place them at decreased risk for further clinical deterioration. Furthermore, it is anticipated that the patient will be medically stable for discharge from the hospital within 2 midnights of  admission.   Author: Orma Flaming, MD 02/21/2023 5:54 PM  For on call review [email or web address].

## 2023-02-21 NOTE — Progress Notes (Signed)
   02/21/23 2004  Assess: MEWS Score  Temp (!) 101.4 F (38.6 C)  BP 112/67  MAP (mmHg) 81  Pulse Rate (!) 106  Resp 19  Assess: MEWS Score  MEWS Temp 1  MEWS Systolic 0  MEWS Pulse 1  MEWS RR 0  MEWS LOC 0  MEWS Score 2  MEWS Score Color Yellow  Assess: if the MEWS score is Yellow or Red  Were vital signs taken at a resting state? Yes  Focused Assessment No change from prior assessment  Does the patient meet 2 or more of the SIRS criteria? Yes  Does the patient have a confirmed or suspected source of infection? No  Provider and Rapid Response Notified? Yes  MEWS guidelines implemented  Yes, yellow  Treat  MEWS Interventions Considered administering scheduled or prn medications/treatments as ordered  MEWS Interventions Administered prn meds/treatments (tylenol)  Take Vital Signs  Increase Vital Sign Frequency  Yellow: Q2hr x1, continue Q4hrs until patient remains green for 12hrs  Escalate  MEWS: Escalate Yellow: Discuss with charge nurse and consider notifying provider and/or RRT  Notify: Charge Nurse/RN  Name of Charge Nurse/RN Notified Carla RN  Date Charge Nurse/RN Notified 02/21/23  Time Charge Nurse/RN Notified 2016  Provider Notification  Provider Name/Title Abigail Butts NP  Date Provider Notified 02/21/23  Time Provider Notified 2010  Method of Notification Page (Secure chat)  Notification Reason Other (Comment) (fever and tachycardia)  Provider response No new orders  Date of Provider Response 02/21/23  Time of Provider Response 2013  Assess: SIRS CRITERIA  SIRS Temperature  1  SIRS Pulse 1  SIRS Respirations  0  SIRS WBC 1  SIRS Score Sum  3

## 2023-02-21 NOTE — Assessment & Plan Note (Addendum)
Improving

## 2023-02-21 NOTE — Assessment & Plan Note (Addendum)
42 year old male presenting with 1 day history of acute onset of low back pain that is intractable despite multiple doses of IV pain medication  -obs to med surg -lumbar MRI w/wout contrast negative for any acute finding -CT stone protocol with no acute finding -exam with TTP in bilateral SI joints -no precipitating factors  -discussed with his oncologist and possibly the granix working -pain control with lidocaine patch, robaxin PRN, oxycodone and dilaudid  -transfuse 1 unit PRBC

## 2023-02-21 NOTE — ED Notes (Signed)
Patient transported to MRI 

## 2023-02-21 NOTE — Progress Notes (Signed)
Pharmacy Antibiotic Note  Logan Ward is a 42 y.o. male admitted on 02/21/2023 with  febrile neutropenia .  Pharmacy has been consulted for cefepime dosing.  Today, 02/21/23 WBC 1, ANC 0.1 Tmax 101.4 F SCr WNL  Plan: Cefepime 2 g IV q8h Follow renal function, culture data, ANC  Height: '6\' 1"'$  (185.4 cm) Weight: 120.7 kg (266 lb) IBW/kg (Calculated) : 79.9  Temp (24hrs), Avg:99.7 F (37.6 C), Min:98 F (36.7 C), Max:101.4 F (38.6 C)  Recent Labs  Lab 02/17/23 1305 02/21/23 0819 02/21/23 0917 02/21/23 1226  WBC 1.1* 1.1* 1.0*  --   CREATININE 0.83 1.00 0.99  --   LATICACIDVEN  --   --  3.5* 3.1*    Estimated Creatinine Clearance: 136.3 mL/min (by C-G formula based on SCr of 0.99 mg/dL).    No Known Allergies  Antimicrobials this admission: cefepime 3/8 >>   Dose adjustments this admission:  Microbiology results: 3/8 BCx:  3/8 UCx:    Lenis Noon, PharmD 02/21/2023 8:53 PM

## 2023-02-21 NOTE — ED Provider Notes (Signed)
[place] EMERGENCY DEPARTMENT AT [facility] Provider Note   CSN: [id number] Arrival date & time: 02/21/23  [id number]     History  No chief complaint on file.   Logan Ward is a 42 y.o. male.  The history is provided by the patient, a parent and medical records. No language interpreter was used.  Back Pain Location:  Lumbar spine Quality:  Aching Radiates to:  Does not radiate Pain severity:  Severe Pain is:  Unable to specify Onset quality:  Gradual Duration:  1 day Timing:  Constant Progression:  Unchanged Chronicity:  New Context: not recent injury   Relieved by:  Nothing Worsened by:  Palpation Ineffective treatments:  None tried Associated symptoms: weakness (per pt)   Associated symptoms: no abdominal pain, no bladder incontinence, no bowel incontinence, no chest pain, no dysuria, no fever, no headaches, no leg pain, no numbness, no paresthesias, no pelvic pain and no tingling   Risk factors: hx of cancer        Home Medications Prior to Admission medications   Medication Sig Start Date End Date Taking? Authorizing Provider  ascorbic acid (VITAMIN C) 500 MG tablet Take 500 mg by mouth daily.    [provider]  bictegravir-emtricitabine-tenofovir AF (BIKTARVY) 50-200-25 MG TABS tablet Take 1 tablet by mouth daily. 12/19/22   Golden Circle, FNP  dexamethasone (DECADRON) 4 MG tablet Take 1 tablet (4 mg total) by mouth 2 (two) times daily with breakfast and lunch for 2 days after completion of chemotherapy 02/14/23   Brunetta Genera, MD  levofloxacin (LEVAQUIN) 500 MG tablet Take 1 tablet (500 mg total) by mouth daily. Start on 02/18/2023 02/14/23   Brunetta Genera, MD  ondansetron (ZOFRAN) 8 MG tablet Take 1 tablet (8 mg total) by mouth every 8 (eight) hours as needed for nausea. Patient not taking: Reported on 01/31/2023 11/22/22   Brunetta Genera, MD  prochlorperazine (COMPAZINE) 10 MG tablet Take 1 tablet (10 mg total) by mouth every 6  (six) hours as needed. Patient not taking: Reported on 01/31/2023 12/02/22   Heilingoetter, Cassandra L, PA-C  senna-docusate (SENOKOT-S) 8.6-50 MG tablet Take 2 tablets by mouth at bedtime as needed for mild constipation. 02/14/23   Brunetta Genera, MD  sulfamethoxazole-trimethoprim (BACTRIM DS) 800-160 MG tablet Take 1 tablet by mouth daily. Patient not taking: Reported on 02/10/2023 02/03/23   Mckinley Jewel, MD      Allergies    Patient has no known allergies.    Review of Systems   Review of Systems  Constitutional:  Negative for chills, fatigue and fever.  HENT:  Negative for congestion.   Eyes:  Negative for visual disturbance.  Respiratory:  Negative for cough, chest tightness, shortness of breath and wheezing.   Cardiovascular:  Negative for chest pain.  Gastrointestinal:  Negative for abdominal pain, bowel incontinence, diarrhea, nausea and vomiting.  Genitourinary:  Negative for bladder incontinence, dysuria, flank pain, frequency and pelvic pain.  Musculoskeletal:  Positive for back pain. Negative for neck pain and neck stiffness.  Skin:  Negative for rash and wound.  Neurological:  Positive for weakness (per pt). Negative for tingling, light-headedness, numbness, headaches and paresthesias.  Psychiatric/Behavioral:  Negative for agitation and confusion.   All other systems reviewed and are negative.   Physical Exam Updated Vital Signs There were no vitals taken for this visit. Physical Exam Vitals and nursing note reviewed.  Constitutional:      General: He is not in  acute distress.    Appearance: He is well-developed. He is not ill-appearing, toxic-appearing or diaphoretic.  HENT:     Head: Normocephalic and atraumatic.     Nose: Nose normal.     Mouth/Throat:     Mouth: Mucous membranes are moist.  Eyes:     Conjunctiva/sclera: Conjunctivae normal.  Cardiovascular:     Rate and Rhythm: Regular rhythm. Tachycardia present.     Heart sounds: No murmur  heard. Pulmonary:     Effort: Pulmonary effort is normal. No respiratory distress.     Breath sounds: Normal breath sounds. No wheezing, rhonchi or rales.  Chest:     Chest wall: No tenderness.  Abdominal:     General: Abdomen is flat.     Palpations: Abdomen is soft.     Tenderness: There is no abdominal tenderness. There is no right CVA tenderness, left CVA tenderness, guarding or rebound.  Musculoskeletal:        General: Tenderness present. No swelling.     Cervical back: Neck supple.     Lumbar back: Tenderness and bony tenderness present. No signs of trauma.       Back:     Right lower leg: No edema.     Left lower leg: No edema.  Skin:    General: Skin is warm and dry.     Capillary Refill: Capillary refill takes less than 2 seconds.     Findings: No erythema or rash.  Neurological:     Mental Status: He is alert.     Sensory: No sensory deficit.     Motor: No weakness.  Psychiatric:        Mood and Affect: Mood normal.     ED Results / Procedures / Treatments   Labs (all labs ordered are listed, but only abnormal results are displayed) Labs Reviewed  CBC WITH DIFFERENTIAL/PLATELET - Abnormal; Notable for the following components:      Result Value   WBC 1.0 (*)    RBC 2.63 (*)    Hemoglobin 7.3 (*)    HCT 23.4 (*)    RDW 17.2 (*)    Platelets 38 (*)    nRBC 19.8 (*)    Neutro Abs 0.1 (*)    Lymphs Abs 0.4 (*)    All other components within normal limits  COMPREHENSIVE METABOLIC PANEL - Abnormal; Notable for the following components:   Sodium 134 (*)    Glucose, Bld 114 (*)    All other components within normal limits  LACTIC ACID, PLASMA - Abnormal; Notable for the following components:   Lactic Acid, Venous 3.5 (*)    All other components within normal limits  LACTIC ACID, PLASMA - Abnormal; Notable for the following components:   Lactic Acid, Venous 3.1 (*)    All other components within normal limits  URINE CULTURE  URINALYSIS, ROUTINE W REFLEX  MICROSCOPIC    EKG None  Radiology MR Lumbar Spine W Wo Contrast  Result Date: 02/21/2023 CLINICAL DATA:  Low back pain, weakness in both legs. History of Burkitt's lymphoma and HIV. EXAM: MRI LUMBAR SPINE WITHOUT AND WITH CONTRAST TECHNIQUE: Multiplanar and multiecho pulse sequences of the lumbar spine were obtained without and with intravenous contrast. CONTRAST:  35m GADAVIST GADOBUTROL 1 MMOL/ML IV SOLN COMPARISON:  PET-CT 01/17/2023 FINDINGS: Segmentation: Standard; the lowest formed disc space is designated L5-S1. Alignment:  Normal. Vertebrae: Vertebral body heights are preserved. Background marrow signal is normal. There is no suspicious marrow signal abnormality, marrow edema,  or marrow enhancement. Conus medullaris and cauda equina: Conus extends to the L1 level. Conus and cauda equina appear normal. There is no abnormal enhancement of the cauda equina nerve roots. Paraspinal and other soft tissues: Unremarkable. Disc levels: There is disc desiccation without significant loss of height at L5-S1. The other disc spaces are preserved T12-L1: Unremarkable L1-L2: Unremarkable. L2-L3: Unremarkable. L3-L4: Unremarkable. L4-L5: There is a mild disc bulge and minimal facet arthropathy resulting in mild right worse than left neural foraminal stenosis without significant spinal canal stenosis L5-S1: There is a disc bulge with a central annular fissure, mild degenerative endplate spurring, and mild facet arthropathy resulting in mild to moderate right and mild left neural foraminal stenosis without significant spinal canal stenosis. IMPRESSION: 1. No suspicious marrow signal abnormality or abnormal enhancement. 2. Mild degenerative change at L4-L5 and L5-S1 with mild-to-moderate right and mild left neural foraminal stenosis at L5-S1. Electronically Signed   By: Valetta Mole M.D.   On: 02/21/2023 11:34    Procedures Procedures    Medications Ordered in ED Medications  ondansetron (ZOFRAN) injection 4  mg (0 mg Intravenous Hold 02/21/23 1032)  HYDROmorphone (DILAUDID) injection 1 mg (1 mg Intravenous Given 02/21/23 0914)  HYDROmorphone (DILAUDID) injection 1 mg (1 mg Intravenous Given 02/21/23 1052)  gadobutrol (GADAVIST) 1 MMOL/ML injection 10 mL (10 mLs Intravenous Contrast Given 02/21/23 1120)  HYDROmorphone (DILAUDID) injection 1 mg (1 mg Intravenous Given 02/21/23 1302)    ED Course/ Medical Decision Making/ A&P                             Medical Decision Making Amount and/or Complexity of Data Reviewed Labs: ordered. Radiology: ordered.  Risk Prescription drug management.    BOHDAN PIZZINI is a 42 y.o. 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  According to patient and family, patient was doing well until around 10 PM last night when he was laying down and had onset of pain in his back.  He reports it is 10 out of 10 in severity in his low back.  He reports he is subjectively feeling somewhat weak in his legs bilaterally.  He denies any numbness and denies any loss of bowel or bladder control.  He reports no urinary symptoms that feel like UTI and he has no dysuria or hematuria.  He denies any trauma.  Denies any history of this back pain.  Reports no fevers, chills, congestion, cough, or URI symptoms.  Denies any anterior abdominal pain, nausea, vomiting.  Just complains of the 10 out of 10 pain in his back and feeling slightly weak in his legs to where he did not feel comfortable walking today.  Patient went to the cancer center for infusion therapy and they sent him here with the amount of pain distress he is having.  On my exam, lungs clear.  Chest nontender.  Abdomen nontender.  Upper back nontender.  Paraspinal back nontender in the mid back.  Patient had tenderness in his low back with paraspinally and in the midline in the lumbar spine.  He had intact sensation and pulses in lower  extremities and reflexes seem symmetric.  He had symmetric strength as well but he subjectively felt that they felt slightly weaker than baseline.  Objectively I did not appreciate a difference in strength in the legs.  Given the patient's lymphoma and the sudden  onset severe back pain with the subjective leg symptoms I do feel need to get MRI with and without to look for metastasis, masses, bleed, or some sort of pathologic fracture.  Will give him pain medicine, get labs, urinalysis, and get MRI with and without to evaluate.  Anticipate reassessment after imaging to determine disposition.  12:26 PM MRI returned and does not show evidence of acute metastasis to the spine, marrow change, epidural bleed, epidural abscess, or other acute abnormality.  I went to reassess patient and he is saying he has had kidney stones before.  Will order a stone study to look for kidney stone and will get a urinalysis.  His labs have returned and his counts are all dropping.  Lactic acid is elevated, will trend.  Anticipate admission after workup is completed.  3:25 PM Care transferred: Continue to await results of stone study and reassess.  Dissipate admission for uncontrolled pain and the concerning lab findings with pancytopenia worsening.        Final Clinical Impression(s) / ED Diagnoses Final diagnoses:  Acute low back pain without sciatica, unspecified back pain laterality     Clinical Impression: 1. Acute low back pain without sciatica, unspecified back pain laterality     Disposition: Admit  This note was prepared with assistance of Dragon voice recognition software. Occasional wrong-word or sound-a-like substitutions may have occurred due to the inherent limitations of voice recognition software.     Yazleen Molock, Gwenyth Allegra, MD 02/21/23 1525

## 2023-02-21 NOTE — ED Notes (Signed)
ED TO INPATIENT HANDOFF REPORT  ED Nurse Name and Phone #: Sylvan Cheese Name/Age/Gender Logan Ward 42 y.o. male Room/Bed: WA09/WA09  Code Status   Code Status: Prior  Home/SNF/Other Home Patient oriented to: self, place, time, and situation Is this baseline? Yes   Triage Complete: Triage complete  Chief Complaint Intractable back pain [M54.9]  Triage Note Here by POV with family from home via [place], here for back pain, onset 0200, denies sx other than pain, restless and writhing in pain, EDP into room during triage. Pt alert, interactive.    Allergies No Known Allergies  Level of Care/Admitting Diagnosis ED Disposition     ED Disposition  Admit   Condition  --   Comment  Hospital Area: [place] [100102]  Level of Care: Med-Surg [16]  May place patient in observation at [facility] or [place] if equivalent level of care is available:: No  Covid Evaluation: Asymptomatic - no recent exposure (last 10 days) testing not required  Diagnosis: Intractable back pain [720110]  Admitting Physician: Orma Flaming [id number]  Attending Physician: Orma Flaming [id number]          B Medical/Surgery History Past Medical History:  Diagnosis Date   Anal fissure    HIV infection ([place])    Internal hemorrhoids    Obesity    Rectal ulcer    Past Surgical History:  Procedure Laterality Date   IR IMAGING GUIDED PORT INSERTION  11/18/2022     A IV Location/Drains/Wounds Patient Lines/Drains/Airways Status     Active Line/Drains/Airways     Name Placement date Placement time Site Days   Implanted Port 11/18/22 Left Chest 11/18/22  1118  Chest  95            Intake/Output Last 24 hours No intake or output data in the 24 hours ending 02/21/23 1706  Labs/Imaging Results for orders placed or performed during the hospital encounter of 02/21/23 (from the past 48 hour(s))  CBC with Differential     Status: Abnormal   Collection  Time: 02/21/23  9:17 AM  Result Value Ref Range   WBC 1.0 (LL) 4.0 - 10.5 K/uL    Comment: REPEATED TO VERIFY THIS CRITICAL RESULT HAS VERIFIED AND BEEN CALLED TO BONKANO,S. RN BY NICOLE MCCOY ON 03 08 2024 AT 1020, AND HAS BEEN READ BACK.     RBC 2.63 (L) 4.22 - 5.81 MIL/uL   Hemoglobin 7.3 (L) 13.0 - 17.0 g/dL   HCT 23.4 (L) 39.0 - 52.0 %   MCV 89.0 80.0 - 100.0 fL   MCH 27.8 26.0 - 34.0 pg   MCHC 31.2 30.0 - 36.0 g/dL   RDW 17.2 (H) 11.5 - 15.5 %   Platelets 38 (L) 150 - 400 K/uL    Comment: Immature Platelet Fraction may be clinically indicated, consider ordering this additional test [id number]    nRBC 19.8 (H) 0.0 - 0.2 %   Neutrophils Relative % 8 %   Neutro Abs 0.1 (LL) 1.7 - 7.7 K/uL    Comment: REPEATED TO VERIFY THIS CRITICAL RESULT HAS VERIFIED AND BEEN CALLED TO BONKANO,S. RN BY NICOLE MCCOY ON 03 08 2024 AT 1020, AND HAS BEEN READ BACK.     Lymphocytes Relative 41 %   Lymphs Abs 0.4 (L) 0.7 - 4.0 K/uL   Monocytes Relative 44 %   Monocytes Absolute 0.5 0.1 - 1.0 K/uL   Eosinophils Relative 0 %   Eosinophils Absolute 0.0 0.0 - 0.5  K/uL   Basophils Relative 3 %   Basophils Absolute 0.0 0.0 - 0.1 K/uL   Immature Granulocytes 4 %   Abs Immature Granulocytes 0.04 0.00 - 0.07 K/uL   Tear Drop Cells PRESENT    Polychromasia PRESENT     Comment: Performed at [facility], [place] [address], [place], [place] [address]  Comprehensive metabolic panel     Status: Abnormal   Collection Time: 02/21/23  9:17 AM  Result Value Ref Range   Sodium 134 (L) 135 - 145 mmol/L   Potassium 3.7 3.5 - 5.1 mmol/L   Chloride 99 98 - 111 mmol/L   CO2 25 22 - 32 mmol/L   Glucose, Bld 114 (H) 70 - 99 mg/dL    Comment: Glucose reference range applies only to samples taken after fasting for at least 8 hours.   BUN 14 6 - 20 mg/dL   Creatinine, Ser 0.99 0.61 - 1.24 mg/dL   Calcium 8.9 8.9 - 10.3 mg/dL   Total Protein 6.6 6.5 - 8.1 g/dL   Albumin 3.9 3.5 - 5.0 g/dL   AST 29  15 - 41 U/L   ALT 23 0 - 44 U/L   Alkaline Phosphatase 45 38 - 126 U/L   Total Bilirubin 0.9 0.3 - 1.2 mg/dL   GFR, Estimated >60 >60 mL/min    Comment: (NOTE) Calculated using the CKD-EPI Creatinine Equation (2021)    Anion gap 10 5 - 15    Comment: Performed at [facility], [place] [address] [place], [place] [address]  Lactic acid, plasma     Status: Abnormal   Collection Time: 02/21/23  9:17 AM  Result Value Ref Range   Lactic Acid, Venous 3.5 (HH) 0.5 - 1.9 mmol/L    Comment: CRITICAL RESULT CALLED TO, READ BACK BY AND VERIFIED WITH BLAIR I. RN AT 1011 ON 02/21/2023 BY MECIAL J. Performed at [name], [place] [address] [place], [place] [address]   Lactic acid, plasma     Status: Abnormal   Collection Time: 02/21/23 12:26 PM  Result Value Ref Range   Lactic Acid, Venous 3.1 (HH) 0.5 - 1.9 mmol/L    Comment: CRITICAL RESULT CALLED TO, READ BACK BY AND VERIFIED WITH CORTES, I RN '@1308'$  02/21/23. GILBERTL Performed at [facility], [place] [address] [place], [place] [address]   Urinalysis, Routine w reflex microscopic -Urine, Clean Catch     Status: None   Collection Time: 02/21/23 12:54 PM  Result Value Ref Range   Color, Urine YELLOW YELLOW   APPearance CLEAR CLEAR   Specific Gravity, Urine 1.024 1.005 - 1.030   pH 5.0 5.0 - 8.0   Glucose, UA NEGATIVE NEGATIVE mg/dL   Hgb urine dipstick NEGATIVE NEGATIVE   Bilirubin Urine NEGATIVE NEGATIVE   Ketones, ur NEGATIVE NEGATIVE mg/dL   Protein, ur NEGATIVE NEGATIVE mg/dL   Nitrite NEGATIVE NEGATIVE   Leukocytes,Ua NEGATIVE NEGATIVE    Comment: Performed at [facility], [place] [address], [place], [place] [address]   CT Renal Stone Study  Result Date: 02/21/2023 CLINICAL DATA:  Abdominal pain, back pain EXAM: CT ABDOMEN AND PELVIS WITHOUT CONTRAST TECHNIQUE: Multidetector CT imaging of the abdomen and pelvis was performed following the standard protocol without  IV contrast. RADIATION DOSE REDUCTION: This exam was performed according to the departmental dose-optimization program which includes automated exposure control, adjustment of the mA and/or kV according to patient size and/or use of iterative reconstruction technique. COMPARISON:  11/10/2022 FINDINGS: Lower chest: There is no focal consolidation in the lower lung fields. Coronary artery calcifications are seen. Hepatobiliary: No focal abnormalities are seen in liver. There is no dilation of bile ducts. Gallbladder is unremarkable. Pancreas: No focal abnormalities are seen. Spleen: Unremarkable. Adrenals/Urinary Tract: Adrenals are unremarkable. There is no hydronephrosis. There are no renal or ureteral stones. There is increased density in the lumen of urinary bladder, possibly related to previously administered contrast. Stomach/Bowel: Stomach is not distended. Small bowel loops are not dilated. Appendix is not dilated. There is no significant wall thickening in colon. There is no pericolic stranding. Vascular/Lymphatic: There are scattered subcentimeter nodes in mesentery and retroperitoneum which appear less prominent. No new significant lymphadenopathy is seen. Vascular structures are unremarkable. Reproductive: Unremarkable. Other: There is no ascites or pneumoperitoneum. Small umbilical hernia containing fat is seen. Bilateral inguinal hernias containing fat are noted, larger on the left side. Musculoskeletal: No acute findings are seen. IMPRESSION: There is no evidence of intestinal obstruction or pneumoperitoneum. There is no hydronephrosis. Appendix is not dilated. Coronary artery calcifications are seen. Electronically Signed   By: Elmer Picker M.D.   On: 02/21/2023 15:50   MR Lumbar Spine W Wo Contrast  Result Date: 02/21/2023 CLINICAL DATA:  Low back pain, weakness in both legs. History of Burkitt's lymphoma and HIV. EXAM: MRI LUMBAR SPINE WITHOUT AND WITH CONTRAST TECHNIQUE: Multiplanar and  multiecho pulse sequences of the lumbar spine were obtained without and with intravenous contrast. CONTRAST:  65m GADAVIST GADOBUTROL 1 MMOL/ML IV SOLN COMPARISON:  PET-CT 01/17/2023 FINDINGS: Segmentation: Standard; the lowest formed disc space is designated L5-S1. Alignment:  Normal. Vertebrae: Vertebral body heights are preserved. Background marrow signal is normal. There is no suspicious marrow signal abnormality, marrow edema, or marrow enhancement. Conus medullaris and cauda equina: Conus extends to the L1 level. Conus and cauda equina appear normal. There is no abnormal enhancement of the cauda equina nerve roots. Paraspinal and other soft tissues: Unremarkable. Disc levels: There is disc desiccation without significant loss of height at L5-S1. The other disc spaces are preserved T12-L1: Unremarkable L1-L2: Unremarkable. L2-L3: Unremarkable. L3-L4: Unremarkable. L4-L5: There is a mild disc bulge and minimal facet arthropathy resulting in mild right worse than left neural foraminal stenosis without significant spinal canal stenosis L5-S1: There is a disc bulge with a central annular fissure, mild degenerative endplate spurring, and mild facet arthropathy resulting in mild to moderate right and mild left neural foraminal stenosis without significant spinal canal stenosis. IMPRESSION: 1. No suspicious marrow signal abnormality or abnormal enhancement. 2. Mild degenerative change at L4-L5 and L5-S1 with mild-to-moderate right and mild left neural foraminal stenosis at L5-S1. Electronically Signed   By: PValetta MoleM.D.   On: 02/21/2023 11:34    Pending Labs Unresulted Labs (From admission, onward)     Start     Ordered   02/21/23 1648  Type and screen WLester Once,   STAT       Comments: [place]   02/21/23 1647   02/21/23 0857  Urine Culture  Once,   URGENT       Question:  Indication  Answer:  Flank Pain   02/21/23 0857             Vitals/Pain Today's Vitals   02/21/23 1257 02/21/23 1302 02/21/23 1420 02/21/23 1624  BP:  (!) 122/92    Pulse:  (!) 118    Resp:  (!) 22  Temp:  99.2 F (37.3 C)    TempSrc:  Oral    SpO2:  94%    Weight:      PainSc: 10-Worst pain ever  Asleep 10-Worst pain ever    Isolation Precautions No active isolations  Medications Medications  ondansetron (ZOFRAN) injection 4 mg (0 mg Intravenous Hold 02/21/23 1032)  HYDROmorphone (DILAUDID) injection 1 mg (1 mg Intravenous Given 02/21/23 0914)  HYDROmorphone (DILAUDID) injection 1 mg (1 mg Intravenous Given 02/21/23 1052)  gadobutrol (GADAVIST) 1 MMOL/ML injection 10 mL (10 mLs Intravenous Contrast Given 02/21/23 1120)  HYDROmorphone (DILAUDID) injection 1 mg (1 mg Intravenous Given 02/21/23 1302)  HYDROmorphone (DILAUDID) injection 1 mg (1 mg Intravenous Given 02/21/23 1619)    Mobility walks with person assist     Focused Assessments Cardiac Assessment Handoff:    No results found for: "CKTOTAL", "CKMB", "CKMBINDEX", "TROPONINI" No results found for: "DDIMER" Does the Patient currently have chest pain? No    R Recommendations: See Admitting Provider Note  Report given to:   Additional Notes: AAOx4, ambulates but currently needs assistance's due to pain

## 2023-02-21 NOTE — ED Provider Notes (Signed)
  Physical Exam  BP (!) 122/92   Pulse (!) 118   Temp 99.2 F (37.3 C) (Oral)   Resp (!) 22   Wt 123.4 kg   SpO2 94%   BMI 35.89 kg/m   Physical Exam  Procedures  Procedures  ED Course / MDM    Medical Decision Making Amount and/or Complexity of Data Reviewed Labs: ordered. Radiology: ordered.  Risk Prescription drug management.    Received patient in signout.  Severe low back pain.  Began yesterday.  No real history of back pain.  Does have Burkitt's lymphoma and HIV/AIDS.  Pancytopenia.  Worse than baseline.  Is on treatment for liver cancer.  No fevers.  However MRI done and reassuring but did show some bulging disc but abscess or hematoma.  CT scan done to evaluate for possible stone or other cause of pain.  It also was reassuring.  However continue to have severe pain in the back. urine does not show infection.  Will discuss with hospitalist for admission since can continue and repeated doses of narcotics for pain control.       Davonna Belling, MD 02/21/23 [phone]

## 2023-02-21 NOTE — Progress Notes (Signed)
Patient refused blood culture draws. Patient was educated on the importance of lab draws and their role in care plan determination. Patient still refused. On call NP notified

## 2023-02-22 DIAGNOSIS — E872 Acidosis, unspecified: Secondary | ICD-10-CM | POA: Diagnosis present

## 2023-02-22 DIAGNOSIS — T451X5A Adverse effect of antineoplastic and immunosuppressive drugs, initial encounter: Secondary | ICD-10-CM | POA: Diagnosis present

## 2023-02-22 DIAGNOSIS — I2510 Atherosclerotic heart disease of native coronary artery without angina pectoris: Secondary | ICD-10-CM | POA: Diagnosis present

## 2023-02-22 DIAGNOSIS — C8378 Burkitt lymphoma, lymph nodes of multiple sites: Secondary | ICD-10-CM | POA: Diagnosis present

## 2023-02-22 DIAGNOSIS — K5903 Drug induced constipation: Secondary | ICD-10-CM | POA: Diagnosis present

## 2023-02-22 DIAGNOSIS — Z79899 Other long term (current) drug therapy: Secondary | ICD-10-CM | POA: Diagnosis not present

## 2023-02-22 DIAGNOSIS — D61810 Antineoplastic chemotherapy induced pancytopenia: Secondary | ICD-10-CM | POA: Diagnosis present

## 2023-02-22 DIAGNOSIS — M549 Dorsalgia, unspecified: Secondary | ICD-10-CM | POA: Diagnosis present

## 2023-02-22 DIAGNOSIS — Z87442 Personal history of urinary calculi: Secondary | ICD-10-CM | POA: Diagnosis not present

## 2023-02-22 DIAGNOSIS — E871 Hypo-osmolality and hyponatremia: Secondary | ICD-10-CM | POA: Diagnosis present

## 2023-02-22 DIAGNOSIS — Z8249 Family history of ischemic heart disease and other diseases of the circulatory system: Secondary | ICD-10-CM | POA: Diagnosis not present

## 2023-02-22 DIAGNOSIS — Z808 Family history of malignant neoplasm of other organs or systems: Secondary | ICD-10-CM | POA: Diagnosis not present

## 2023-02-22 DIAGNOSIS — Z7952 Long term (current) use of systemic steroids: Secondary | ICD-10-CM | POA: Diagnosis not present

## 2023-02-22 DIAGNOSIS — T50905A Adverse effect of unspecified drugs, medicaments and biological substances, initial encounter: Secondary | ICD-10-CM | POA: Diagnosis present

## 2023-02-22 DIAGNOSIS — C8370 Burkitt lymphoma, unspecified site: Secondary | ICD-10-CM | POA: Diagnosis present

## 2023-02-22 DIAGNOSIS — M5489 Other dorsalgia: Secondary | ICD-10-CM | POA: Diagnosis present

## 2023-02-22 DIAGNOSIS — M4807 Spinal stenosis, lumbosacral region: Secondary | ICD-10-CM | POA: Diagnosis present

## 2023-02-22 DIAGNOSIS — B20 Human immunodeficiency virus [HIV] disease: Secondary | ICD-10-CM | POA: Diagnosis present

## 2023-02-22 LAB — CBC
HCT: 24.7 % — ABNORMAL LOW (ref 39.0–52.0)
Hemoglobin: 7.9 g/dL — ABNORMAL LOW (ref 13.0–17.0)
MCH: 28.2 pg (ref 26.0–34.0)
MCHC: 32 g/dL (ref 30.0–36.0)
MCV: 88.2 fL (ref 80.0–100.0)
Platelets: 40 10*3/uL — ABNORMAL LOW (ref 150–400)
RBC: 2.8 MIL/uL — ABNORMAL LOW (ref 4.22–5.81)
RDW: 17 % — ABNORMAL HIGH (ref 11.5–15.5)
WBC: 2 10*3/uL — ABNORMAL LOW (ref 4.0–10.5)
nRBC: 14.1 % — ABNORMAL HIGH (ref 0.0–0.2)

## 2023-02-22 LAB — BASIC METABOLIC PANEL
Anion gap: 7 (ref 5–15)
BUN: 15 mg/dL (ref 6–20)
CO2: 25 mmol/L (ref 22–32)
Calcium: 8.3 mg/dL — ABNORMAL LOW (ref 8.9–10.3)
Chloride: 96 mmol/L — ABNORMAL LOW (ref 98–111)
Creatinine, Ser: 1.01 mg/dL (ref 0.61–1.24)
GFR, Estimated: >60 mL/min (ref >60)
Glucose, Bld: 106 mg/dL — ABNORMAL HIGH (ref 70–99)
Potassium: 3.6 mmol/L (ref 3.5–5.1)
Sodium: 128 mmol/L — ABNORMAL LOW (ref 135–145)

## 2023-02-22 LAB — URINE CULTURE: Culture: NO GROWTH

## 2023-02-22 LAB — RAPID URINE DRUG SCREEN, HOSP PERFORMED
Amphetamines: NOT DETECTED
Barbiturates: NOT DETECTED
Benzodiazepines: NOT DETECTED
Cocaine: NOT DETECTED
Opiates: POSITIVE — AB
Tetrahydrocannabinol: NOT DETECTED

## 2023-02-22 LAB — LACTIC ACID, PLASMA
Lactic Acid, Venous: 1.8 mmol/L (ref 0.5–1.9)
Lactic Acid, Venous: 1.4 mmol/L (ref 0.5–1.9)

## 2023-02-22 MED ORDER — HEPARIN SOD (PORK) LOCK FLUSH 100 UNIT/ML IV SOLN: 500.0000 [IU] | Freq: Every day | INTRAVENOUS | Status: DC | PRN

## 2023-02-22 MED ORDER — SODIUM CHLORIDE 0.9% FLUSH
10.0000 mL | INTRAVENOUS | Status: DC | PRN
  Administered 2023-02-23: 10 mL

## 2023-02-22 MED ORDER — METHYLPREDNISOLONE SODIUM SUCC 40 MG IJ SOLR: 40.0000 mg | Freq: Once | INTRAMUSCULAR | Status: DC

## 2023-02-22 MED ORDER — HEPARIN SOD (PORK) LOCK FLUSH 100 UNIT/ML IV SOLN: 250.0000 [IU] | INTRAVENOUS | Status: DC | PRN

## 2023-02-22 MED ORDER — SODIUM CHLORIDE 0.9% FLUSH: 10.0000 mL | INTRAVENOUS | Status: DC | PRN

## 2023-02-22 MED ORDER — SODIUM CHLORIDE 0.9% IV SOLUTION: 250.0000 mL | Freq: Once | INTRAVENOUS | Status: DC

## 2023-02-22 MED ORDER — SODIUM CHLORIDE 0.9% FLUSH: 3.0000 mL | INTRAVENOUS | Status: DC | PRN

## 2023-02-22 MED ORDER — SODIUM CHLORIDE 0.9% FLUSH: 10.0000 mL | Freq: Two times a day (BID) | INTRAVENOUS | Status: DC

## 2023-02-22 MED ORDER — ACETAMINOPHEN 325 MG PO TABS: 650.0000 mg | ORAL_TABLET | Freq: Once | ORAL | Status: DC

## 2023-02-22 MED ORDER — ACETAMINOPHEN 500 MG PO TABS
1000.0000 mg | ORAL_TABLET | Freq: Three times a day (TID) | ORAL | Status: DC
  Administered 2023-02-22 – 2023-02-23 (×3): 1000 mg via ORAL
  Filled 2023-02-22 (×3): qty 2

## 2023-02-22 NOTE — Progress Notes (Signed)
  Progress Note   Patient: Logan Ward TJL:[id number] DOB: [birth year] DOA: 02/21/2023     0 DOS: the patient was seen and examined on 02/22/2023 at 12:40PM      Brief hospital course: Logan Ward is a 42 y.o. M with HIV and Burkitt's lymphoma who presented with low back pain.  In the ER, MRI lumbar spine unremarkable, CT abdomen and pelvis normal.  Oncology suspected Granix-related pain.  Spiked a fever overnight and was started on antibiotics.     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  It is worse with movements, no other provoking factors.  No confusion, no cough, no dysuria.     Physical Exam: BP 125/73 (BP Location: Left Arm)   Pulse 100   Temp 100.3 F (37.9 C) (Oral)   Resp 18   Ht 6\' 1"  (1.854 m)   Wt 120.7 kg   SpO2 95%   BMI 35.09 kg/m   Thin adult male, lying in bed, no acute distress RRR, no murmurs, no peripheral edema Respiratory normal, lungs clear without rales or wheezes Abdomen soft without tenderness palpation or guarding, no ascites or distention Attention normal, affect normal, judgment insight appear normal With respect to mood, patient reports that his previous outburst was "not a mental thing", he has no prior history of depression, no  prior history of suicidal ideation or suicide attempts, he has no active suicidal ideation at the moment he is just slightly frustrated about being in the hospital, does not feel that he is a danger to himself or others      Data Reviewed: Discussed with psychiatry and oncology CT abdomen pelvis without contrast unremarkable Creatinine potassium normal White blood count up to 2, hemoglobin up to 7.9, platelets up to 40 Sodium slightly down to 128        Disposition: Status is: Observation Patient has required several doses of IV opiate for pain control this morning, unclear that he will tolerate oral regimen yet        Author: Edwin Dada, MD 02/22/2023 2:06 PM  For on call review [email or web address].

## 2023-02-22 NOTE — Hospital Course (Signed)
Mr. Kienitz is a 42 y.o. M with HIV and Burkitt's lymphoma who presented with low back pain.  In the ER, MRI lumbar spine unremarkable, CT abdomen and pelvis normal.  Oncology suspected Granix-related pain.  Spiked a fever overnight and was started on antibiotics.

## 2023-02-22 NOTE — Progress Notes (Signed)
HEMATOLOGY-ONCOLOGY PROGRESS NOTE  SUBJECTIVE: Hospitalized with sudden onset of mid back pain that woke him up at 1 AM in the morning.  Lumbar spine MRI did not show any particular reason but did show degenerative changes at L4-L5 and L5-S1 with neural foraminal stenosis at L5-S1.  CT renal stone study did not reveal any renal lesions or intestinal obstruction.  Today he feels much better.  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 ([place])   11/18/2022 -  Chemotherapy   Patient is on Treatment Plan : IP NON-HODGKINS LYMPHOMA EPOCH q21d     11/25/2022 -  Chemotherapy   Patient is on Treatment Plan : NON-HODGKINS LYMPHOMA Rituximab q21d     12/29/2022 Cancer Staging   Staging form: Hodgkin and Non-Hodgkin Lymphoma, AJCC 8th Edition - Clinical: Stage II - Signed by Brunetta Genera, MD on 12/29/2022 Histopathologic type: Burkitt lymphoma, NOS     OBJECTIVE: REVIEW OF SYSTEMS:   Constitutional: Denies fevers, chills or abnormal weight loss Sudden onset of low back pain All other systems were reviewed with the patient and are negative.  I have reviewed the past medical history, past surgical history, social history and family history with the patient and they are unchanged from previous note.   PHYSICAL EXAMINATION: ECOG PERFORMANCE STATUS: 1 - Symptomatic but completely ambulatory  Vitals:   02/22/23 0510 02/22/23 0900  BP: 127/76 130/77  Pulse: (!) 115 100  Resp: 18 18  Temp: (!) 101 F (38.3 C) 99.8 F (37.7 C)  SpO2: 92% 97%   Filed Weights   02/21/23 0853 02/21/23 1712  Weight: 272 lb (123.4 kg) 266 lb (120.7 kg)      LABORATORY DATA:  I have reviewed the data as listed    Latest Ref Rng & Units 02/22/2023    5:48 AM 02/21/2023    9:17 AM 02/21/2023    8:19 AM  CMP  Glucose 70 - 99 mg/dL 106  114   109   BUN 6 - 20 mg/dL '15  14  14   '$ Creatinine 0.61 - 1.24 mg/dL 1.01  0.99  1.00   Sodium 135 - 145 mmol/L 128  134  134   Potassium 3.5 - 5.1 mmol/L 3.6  3.7  3.8   Chloride 98 - 111 mmol/L 96  99  100   CO2 22 - 32 mmol/L '25  25  26   '$ Calcium 8.9 - 10.3 mg/dL 8.3  8.9  9.3   Total Protein 6.5 - 8.1 g/dL  6.6  6.5   Total Bilirubin 0.3 - 1.2 mg/dL  0.9  0.8   Alkaline Phos 38 - 126 U/L  45  49   AST 15 - 41 U/L  29  19   ALT 0 - 44 U/L  23  20     Lab Results  Component Value Date   WBC 2.0 (L) 02/22/2023   HGB 7.9 (L) 02/22/2023   HCT 24.7 (L) 02/22/2023   MCV 88.2 02/22/2023   PLT 40 (L) 02/22/2023   NEUTROABS 0.1 (LL) 02/21/2023    ASSESSMENT AND PLAN: 1.  Burkitt's lymphoma with history of HIV/AIDS: Recent chemotherapy completed 02/14/2023.   2. pancytopenia secondary to chemotherapy: ANC yesterday was 0.1 patient was afebrile and hence he does not need empiric antibiotics. 3.  Severe back pain: Could be due to  growth factor injection since the MRI did not reveal any clear-cut etiology other than degenerative changes. 4.  Anemia: Hemoglobin was 7.9. 5.  Thrombocytopenia: Platelets are 40 Develops can be watched and monitored. Likely to be discharged tomorrow

## 2023-02-22 NOTE — Progress Notes (Addendum)
ED called and informed nurse secretary Santiago Glad that pt had called ED stating suicidal ideation. This nurse came into room and asked pt if he wanted to kill himself. Pt stated he was thinking about it. Pt denies plan or previous suicidal thoughts. Pt states pain is too much and he hates being stuck in a room. Danford MD messaged and received verbal orders for 1:1 sitter. Charge nurse Nevin Bloodgood aware and will contact AC on duty. Pt resting comfortably and this nurse will monitor until sitter is available.

## 2023-02-23 ENCOUNTER — Encounter: Payer: Self-pay | Admitting: Hematology

## 2023-02-23 DIAGNOSIS — M549 Dorsalgia, unspecified: Secondary | ICD-10-CM | POA: Diagnosis not present

## 2023-02-23 LAB — TYPE AND SCREEN
ABO/RH(D): AB POS
Antibody Screen: NEGATIVE
Unit division: 0

## 2023-02-23 LAB — CBC
HCT: 25.2 % — ABNORMAL LOW (ref 39.0–52.0)
Hemoglobin: 8 g/dL — ABNORMAL LOW (ref 13.0–17.0)
MCH: 28.3 pg (ref 26.0–34.0)
MCHC: 31.7 g/dL (ref 30.0–36.0)
MCV: 89 fL (ref 80.0–100.0)
Platelets: 48 10*3/uL — ABNORMAL LOW (ref 150–400)
RBC: 2.83 MIL/uL — ABNORMAL LOW (ref 4.22–5.81)
RDW: 17.2 % — ABNORMAL HIGH (ref 11.5–15.5)
WBC: 3.6 10*3/uL — ABNORMAL LOW (ref 4.0–10.5)
nRBC: 10.6 % — ABNORMAL HIGH (ref 0.0–0.2)

## 2023-02-23 LAB — BPAM RBC
Blood Product Expiration Date: 202404032359
ISSUE DATE / TIME: 202403082226
Unit Type and Rh: 6200

## 2023-02-23 MED ORDER — HEPARIN SOD (PORK) LOCK FLUSH 100 UNIT/ML IV SOLN
500.0000 [IU] | INTRAVENOUS | Status: AC | PRN
  Administered 2023-02-23: 500 [IU]

## 2023-02-23 NOTE — Progress Notes (Signed)
Reviewed written D/C instructions with patient and all questions answered. Pt verbalized understanding. Pt left with all belongings in stable condition. 

## 2023-02-23 NOTE — Progress Notes (Signed)
There is order for blood culture. Patient refused to stick by phlebotomist since yesterday. Dr. Loleta Books secure chatted that get blood culture x 1 set from implanted port and the other from peripherally which patient refused again. Dr. Loleta Books aware of it might show false positive. Sent blood culture x 1 set drawn from port. HS [employer]

## 2023-02-23 NOTE — Discharge Summary (Signed)
Physician Discharge Summary   Patient: Logan Ward MRN: [id number] DOB: 05-21-1981  Admit date:     02/21/2023  Discharge date: 02/23/23  Discharge Physician: Edwin Dada   PCP: Patient, No Pcp Per     Recommendations at discharge:  Follow up with Dr. 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 M with HIV and Burkitt's lymphoma who presented with low back pain.  In the ER, MRI lumbar spine unremarkable, CT abdomen and pelvis normal.  Oncology suspected Granix-related pain.  Spiked a fever overnight and was started on antibiotics.     * Intractable back pain suspect Granix-related Treated with oxycodone.  Pain resolved with time.  Pancytopenia (HCC) Transfused 1 unit 3/8.  Fever noted, but cultures negative and with counts improving, discussed with Oncology who recommended discharge off antibiotics.  Temp < 100 F, heart rate < 100bpm, RR < 24, SpO2 at baseline.   Stable for discharge.                 [facility] Controlled Substances Registry was reviewed for this patient prior to discharge.   Consultants: Oncology Procedures performed: None  Disposition: Home Diet recommendation:  Regular  DISCHARGE MEDICATION: Allergies as of 02/23/2023   No Known Allergies      Medication List     STOP taking these medications    ondansetron 8 MG tablet Commonly known as: ZOFRAN   prochlorperazine 10 MG tablet Commonly known as: COMPAZINE       TAKE these medications    ascorbic acid 500 MG tablet Commonly known as: VITAMIN C Take 500 mg by mouth daily.   Biktarvy 50-200-25 MG Tabs tablet Generic drug:  bictegravir-emtricitabine-tenofovir AF Take 1 tablet by mouth daily.   dexamethasone 4 MG tablet Commonly known as: DECADRON Take 1 tablet (4 mg total) by mouth 2 (two) times daily with breakfast and lunch for 2 days after completion of chemotherapy   senna-docusate 8.6-50 MG tablet Commonly known as: Senokot-S Take 2 tablets by mouth at bedtime as needed for mild constipation.   sulfamethoxazole-trimethoprim 800-160 MG tablet Commonly known as: BACTRIM DS Take 1 tablet by mouth daily.        Follow-up Information     Brunetta Genera, MD Follow up.   Specialties: Hematology, Oncology Contact information: [place] [address] [phone]                 Discharge Instructions     Increase activity slowly   Complete by: As directed        Discharge Exam: Filed Weights   02/21/23 0853 02/21/23 1712  Weight: 123.4 kg 120.7 kg    General: Pt is alert, awake, not in acute distress Cardiovascular: RRR, nl S1-S2, no murmurs appreciated.   No LE edema.   Respiratory: Normal respiratory rate and rhythm.  CTAB without rales or wheezes. Abdominal: Abdomen soft and non-tender.  No distension or HSM.   Neuro/Psych: Strength symmetric in upper and lower extremities.  Judgment and insight appear normal.   Condition at discharge: good  The results of significant diagnostics from this hospitalization (including imaging, microbiology, ancillary and  laboratory) are listed below for reference.   Imaging Studies: CT Renal Stone Study  Result Date: 02/21/2023 CLINICAL DATA:  Abdominal pain, back pain EXAM: CT ABDOMEN AND PELVIS WITHOUT CONTRAST TECHNIQUE: Multidetector CT imaging of the abdomen and pelvis was performed following the standard protocol without IV contrast. RADIATION DOSE REDUCTION: This exam was performed according to the departmental dose-optimization program which includes automated exposure control, adjustment of the mA and/or  kV according to patient size and/or use of iterative reconstruction technique. COMPARISON:  11/10/2022 FINDINGS: Lower chest: There is no focal consolidation in the lower lung fields. Coronary artery calcifications are seen. Hepatobiliary: No focal abnormalities are seen in liver. There is no dilation of bile ducts. Gallbladder is unremarkable. Pancreas: No focal abnormalities are seen. Spleen: Unremarkable. Adrenals/Urinary Tract: Adrenals are unremarkable. There is no hydronephrosis. There are no renal or ureteral stones. There is increased density in the lumen of urinary bladder, possibly related to previously administered contrast. Stomach/Bowel: Stomach is not distended. Small bowel loops are not dilated. Appendix is not dilated. There is no significant wall thickening in colon. There is no pericolic stranding. Vascular/Lymphatic: There are scattered subcentimeter nodes in mesentery and retroperitoneum which appear less prominent. No new significant lymphadenopathy is seen. Vascular structures are unremarkable. Reproductive: Unremarkable. Other: There is no ascites or pneumoperitoneum. Small umbilical hernia containing fat is seen. Bilateral inguinal hernias containing fat are noted, larger on the left side. Musculoskeletal: No acute findings are seen. IMPRESSION: There is no evidence of intestinal obstruction or pneumoperitoneum. There is no hydronephrosis. Appendix is not dilated. Coronary artery calcifications are seen. Electronically Signed   By: Elmer Picker M.D.   On: 02/21/2023 15:50   MR Lumbar Spine W Wo Contrast  Result Date: 02/21/2023 CLINICAL DATA:  Low back pain, weakness in both legs. History of Burkitt's lymphoma and HIV. EXAM: MRI LUMBAR SPINE WITHOUT AND WITH CONTRAST TECHNIQUE: Multiplanar and multiecho pulse sequences of the lumbar spine were obtained without and with intravenous contrast. CONTRAST:  45m GADAVIST GADOBUTROL 1 MMOL/ML IV SOLN COMPARISON:  PET-CT 01/17/2023  FINDINGS: Segmentation: Standard; the lowest formed disc space is designated L5-S1. Alignment:  Normal. Vertebrae: Vertebral body heights are preserved. Background marrow signal is normal. There is no suspicious marrow signal abnormality, marrow edema, or marrow enhancement. Conus medullaris and cauda equina: Conus extends to the L1 level. Conus and cauda equina appear normal. There is no abnormal enhancement of the cauda equina nerve roots. Paraspinal and other soft tissues: Unremarkable. Disc levels: There is disc desiccation without significant loss of height at L5-S1. The other disc spaces are preserved T12-L1: Unremarkable L1-L2: Unremarkable. L2-L3: Unremarkable. L3-L4: Unremarkable. L4-L5: There is a mild disc bulge and minimal facet arthropathy resulting in mild right worse than left neural foraminal stenosis without significant spinal canal stenosis L5-S1: There is a disc bulge with a central annular fissure, mild degenerative endplate spurring, and mild facet arthropathy resulting in mild to moderate right and mild left neural foraminal stenosis without significant spinal canal stenosis. IMPRESSION: 1. No suspicious marrow signal abnormality or abnormal enhancement. 2. Mild degenerative change at L4-L5 and L5-S1 with mild-to-moderate right and mild left neural foraminal stenosis at L5-S1. Electronically Signed   By: PValetta MoleM.D.   On: 02/21/2023 11:34   DG Chest Port 1 View  Result Date: 01/31/2023 CLINICAL DATA:  Fevers.  Shortness of breath. EXAM: PORTABLE CHEST 1 VIEW COMPARISON:  01/10/2023 FINDINGS: There is a left chest wall port a catheter with tip at the cavoatrial junction.  Heart size appears within normal limits. Lung volumes are low. There is no pleural effusion or edema. No airspace opacities. Osseous structures appear intact. IMPRESSION: No acute cardiopulmonary abnormalities. Electronically Signed   By: Kerby Moors M.D.   On: 01/31/2023 15:34    Microbiology: Results for orders  placed or performed during the hospital encounter of 02/21/23  Urine Culture     Status: None   Collection Time: 02/21/23 12:51 PM   Specimen: Urine, Clean Catch  Result Value Ref Range Status   Specimen Description   Final    URINE, CLEAN CATCH Performed at [facility], [place] [address], [place], [place] [address]    Special Requests   Final    NONE Performed at [facility], [place] [address], [place], [place] [address]    Culture   Final    NO GROWTH Performed at [facility], [place] [address] [place], [place] [address]    Report Status 02/22/2023 FINAL  Final  Culture, blood (Routine X 2) w Reflex to ID Panel     Status: None (Preliminary result)   Collection Time: 02/22/23  3:55 PM   Specimen: BLOOD  Result Value Ref Range Status   Specimen Description   Final    BLOOD BLOOD RIGHT ARM Performed at [place] [address], [place], [place] [address]    Special Requests   Final    BOTTLES DRAWN AEROBIC ONLY Blood Culture adequate volume Performed at [facility] [address] [place], [place] [address]    Culture   Final    NO GROWTH < 24 HOURS Performed at [facility] [address] [place], [place] [address]    Report Status PENDING  Incomplete  Culture, blood (Routine X 2) w Reflex to ID Panel     Status: None (Preliminary result)   Collection Time: 02/22/23  3:56 PM   Specimen: BLOOD  Result Value Ref Range Status   Specimen Description   Final    BLOOD BLOOD LEFT HAND Performed at [place] [facility] [address] [place], [place] [address]    Special Requests   Final    BOTTLES DRAWN AEROBIC ONLY Blood Culture adequate volume Performed at [facility] [address], [place], [place] [address]    Culture   Final    NO GROWTH < 24 HOURS Performed at [facility] [address], [place], [place] [address]    Report Status  PENDING  Incomplete    Labs: CBC: Recent Labs  Lab 02/17/23 1305 02/21/23 0819 02/21/23 0917 02/22/23 0548 02/23/23 0147  WBC 1.1* 1.1* 1.0* 2.0* 3.6*  NEUTROABS 0.8* 0.1* 0.1*  --   --   HGB 7.9* 7.6* 7.3* 7.9* 8.0*  HCT 23.6* 22.9* 23.4* 24.7* 25.2*  MCV 85.2 86.7 89.0 88.2 89.0  PLT 169 40* 38* 40* 48*   Basic Metabolic Panel: Recent Labs  Lab 02/17/23 1305 02/21/23 0819 02/21/23 0917 02/22/23 0548  NA 134* 134* 134* 128*  K 3.2* 3.8 3.7 3.6  CL 100 100 99 96*  CO2 '27 26 25 25  '$ GLUCOSE 161* 109* 114* 106*  BUN '16 14 14 15  '$ CREATININE 0.83 1.00 0.99 1.01  CALCIUM 8.4* 9.3 8.9 8.3*   Liver Function Tests: Recent Labs  Lab 02/17/23 1305 02/21/23 0819 02/21/23 0917  AST 10* 19 29  ALT '16 20 23  '$ ALKPHOS 49 49 45  BILITOT  1.0 0.8 0.9  PROT 6.2* 6.5 6.6  ALBUMIN 3.8 4.0 3.9   CBG: No results for input(s): "GLUCAP" in the last 168 hours.  Discharge time spent: approximately 25 minutes spent on discharge counseling, evaluation of patient on day of discharge, and coordination of discharge planning with nursing, social work, pharmacy and case management  Signed: Edwin Dada, MD [facility] 02/23/2023

## 2023-02-25 ENCOUNTER — Other Ambulatory Visit (HOSPITAL_COMMUNITY): Payer: Self-pay

## 2023-02-27 LAB — CULTURE, BLOOD (ROUTINE X 2)
Culture: NO GROWTH
Culture: NO GROWTH
Special Requests: ADEQUATE
Special Requests: ADEQUATE

## 2023-02-28 ENCOUNTER — Other Ambulatory Visit: Payer: Self-pay

## 2023-02-28 ENCOUNTER — Other Ambulatory Visit (HOSPITAL_COMMUNITY): Payer: Self-pay

## 2023-03-03 ENCOUNTER — Other Ambulatory Visit: Payer: Self-pay

## 2023-03-03 ENCOUNTER — Inpatient Hospital Stay (HOSPITAL_COMMUNITY)
Admission: AD | Admit: 2023-03-03 | Discharge: 2023-03-07 | DRG: 974 | Disposition: A | Discharge status: Home / Self Care | Payer: 59 | Source: Ambulatory Visit | Attending: Hematology | Admitting: Hematology

## 2023-03-03 ENCOUNTER — Encounter (HOSPITAL_COMMUNITY): Payer: Self-pay | Admitting: Hematology

## 2023-03-03 ENCOUNTER — Encounter (HOSPITAL_COMMUNITY): Payer: Self-pay

## 2023-03-03 DIAGNOSIS — Z6835 Body mass index (BMI) 35.0-35.9, adult: Secondary | ICD-10-CM

## 2023-03-03 DIAGNOSIS — E669 Obesity, unspecified: Secondary | ICD-10-CM | POA: Diagnosis present

## 2023-03-03 DIAGNOSIS — R591 Generalized enlarged lymph nodes: Secondary | ICD-10-CM | POA: Diagnosis present

## 2023-03-03 DIAGNOSIS — C8378 Burkitt lymphoma, lymph nodes of multiple sites: Secondary | ICD-10-CM

## 2023-03-03 DIAGNOSIS — D61810 Antineoplastic chemotherapy induced pancytopenia: Secondary | ICD-10-CM | POA: Diagnosis not present

## 2023-03-03 DIAGNOSIS — T451X5A Adverse effect of antineoplastic and immunosuppressive drugs, initial encounter: Secondary | ICD-10-CM | POA: Diagnosis not present

## 2023-03-03 DIAGNOSIS — Z9221 Personal history of antineoplastic chemotherapy: Secondary | ICD-10-CM

## 2023-03-03 DIAGNOSIS — Z7189 Other specified counseling: Secondary | ICD-10-CM

## 2023-03-03 DIAGNOSIS — K5900 Constipation, unspecified: Secondary | ICD-10-CM | POA: Diagnosis present

## 2023-03-03 DIAGNOSIS — K5903 Drug induced constipation: Secondary | ICD-10-CM

## 2023-03-03 DIAGNOSIS — B20 Human immunodeficiency virus [HIV] disease: Secondary | ICD-10-CM | POA: Diagnosis present

## 2023-03-03 DIAGNOSIS — Z5111 Encounter for antineoplastic chemotherapy: Secondary | ICD-10-CM | POA: Diagnosis not present

## 2023-03-03 LAB — CBC WITH DIFFERENTIAL/PLATELET
Abs Immature Granulocytes: 0.01 10*3/uL (ref 0.00–0.07)
Basophils Absolute: 0 10*3/uL (ref 0.0–0.1)
Basophils Relative: 1 %
Eosinophils Absolute: 0 10*3/uL (ref 0.0–0.5)
Eosinophils Relative: 0 %
HCT: 25.9 % — ABNORMAL LOW (ref 39.0–52.0)
Hemoglobin: 7.8 g/dL — ABNORMAL LOW (ref 13.0–17.0)
Immature Granulocytes: 0 %
Lymphocytes Relative: 31 %
Lymphs Abs: 1 10*3/uL (ref 0.7–4.0)
MCH: 27.8 pg (ref 26.0–34.0)
MCHC: 30.1 g/dL (ref 30.0–36.0)
MCV: 92.2 fL (ref 80.0–100.0)
Monocytes Absolute: 0.6 10*3/uL (ref 0.1–1.0)
Monocytes Relative: 18 %
Neutro Abs: 1.6 10*3/uL — ABNORMAL LOW (ref 1.7–7.7)
Neutrophils Relative %: 50 %
Platelets: 212 10*3/uL (ref 150–400)
RBC: 2.81 MIL/uL — ABNORMAL LOW (ref 4.22–5.81)
RDW: 17.8 % — ABNORMAL HIGH (ref 11.5–15.5)
WBC: 3.2 10*3/uL — ABNORMAL LOW (ref 4.0–10.5)
nRBC: 0.6 % — ABNORMAL HIGH (ref 0.0–0.2)

## 2023-03-03 LAB — COMPREHENSIVE METABOLIC PANEL
ALT: 16 U/L (ref 0–44)
AST: 20 U/L (ref 15–41)
Albumin: 3.3 g/dL — ABNORMAL LOW (ref 3.5–5.0)
Alkaline Phosphatase: 55 U/L (ref 38–126)
Anion gap: 3 — ABNORMAL LOW (ref 5–15)
BUN: 7 mg/dL (ref 6–20)
CO2: 26 mmol/L (ref 22–32)
Calcium: 8.2 mg/dL — ABNORMAL LOW (ref 8.9–10.3)
Chloride: 108 mmol/L (ref 98–111)
Creatinine, Ser: 0.95 mg/dL (ref 0.61–1.24)
GFR, Estimated: >60 mL/min (ref >60)
Glucose, Bld: 114 mg/dL — ABNORMAL HIGH (ref 70–99)
Potassium: 3.7 mmol/L (ref 3.5–5.1)
Sodium: 137 mmol/L (ref 135–145)
Total Bilirubin: 0.6 mg/dL (ref 0.3–1.2)
Total Protein: 6.1 g/dL — ABNORMAL LOW (ref 6.5–8.1)

## 2023-03-03 LAB — RETICULOCYTES
Immature Retic Fract: 32.6 % — ABNORMAL HIGH (ref 2.3–15.9)
RBC.: 2.75 MIL/uL — ABNORMAL LOW (ref 4.22–5.81)
Retic Count, Absolute: 87.7 10*3/uL (ref 19.0–186.0)
Retic Ct Pct: 3.2 % — ABNORMAL HIGH (ref 0.4–3.1)

## 2023-03-03 MED ORDER — HOT PACK MISC ONCOLOGY: 1.0000 | Freq: Once | Status: DC | PRN

## 2023-03-03 MED ORDER — SODIUM CHLORIDE 0.9 % IV SOLN: INTRAVENOUS | Status: DC

## 2023-03-03 MED ORDER — SENNOSIDES-DOCUSATE SODIUM 8.6-50 MG PO TABS: 2.0000 | ORAL_TABLET | Freq: Every evening | ORAL | Status: DC | PRN

## 2023-03-03 MED ORDER — COLD PACK MISC ONCOLOGY: 1.0000 | Freq: Once | Status: DC | PRN

## 2023-03-03 MED ORDER — BICTEGRAVIR-EMTRICITAB-TENOFOV 50-200-25 MG PO TABS
1.0000 | ORAL_TABLET | Freq: Every day | ORAL | Status: DC
  Administered 2023-03-03 – 2023-03-07 (×5): 1 via ORAL
  Filled 2023-03-03 (×5): qty 1

## 2023-03-03 MED ORDER — HYDROCORTISONE (PERIANAL) 2.5 % EX CREA: 1.0000 | TOPICAL_CREAM | Freq: Two times a day (BID) | CUTANEOUS | Status: DC | PRN

## 2023-03-03 MED ORDER — SODIUM CHLORIDE 0.9 % IV SOLN
Freq: Once | INTRAVENOUS | Status: AC
  Administered 2023-03-03: 8 mg via INTRAVENOUS
  Filled 2023-03-03: qty 4

## 2023-03-03 MED ORDER — ACETAMINOPHEN 325 MG PO TABS: 650.0000 mg | ORAL_TABLET | ORAL | Status: DC | PRN

## 2023-03-03 MED ORDER — ALUM & MAG HYDROXIDE-SIMETH 200-200-20 MG/5ML PO SUSP: 60.0000 mL | ORAL | Status: DC | PRN

## 2023-03-03 MED ORDER — VINCRISTINE SULFATE CHEMO INJECTION 1 MG/ML
Freq: Once | INTRAVENOUS | Status: AC
  Filled 2023-03-03: qty 13

## 2023-03-03 MED ORDER — SULFAMETHOXAZOLE-TRIMETHOPRIM 800-160 MG PO TABS
1.0000 | ORAL_TABLET | Freq: Every day | ORAL | Status: DC
  Administered 2023-03-03 – 2023-03-07 (×5): 1 via ORAL
  Filled 2023-03-03 (×5): qty 1

## 2023-03-03 MED ORDER — B COMPLEX-C PO TABS
1.0000 | ORAL_TABLET | Freq: Every day | ORAL | Status: DC
  Administered 2023-03-03 – 2023-03-07 (×5): 1 via ORAL
  Filled 2023-03-03 (×5): qty 1

## 2023-03-03 NOTE — Progress Notes (Signed)
Ok to start treatment today without CBC and CMET results per MD.  Acquanetta Belling, RPH, BCPS, BCOP 03/03/2023 3:33 PM

## 2023-03-04 ENCOUNTER — Encounter: Payer: Self-pay | Admitting: Hematology

## 2023-03-04 DIAGNOSIS — Z5111 Encounter for antineoplastic chemotherapy: Secondary | ICD-10-CM | POA: Diagnosis not present

## 2023-03-04 DIAGNOSIS — C8378 Burkitt lymphoma, lymph nodes of multiple sites: Secondary | ICD-10-CM | POA: Diagnosis not present

## 2023-03-04 LAB — CBC WITH DIFFERENTIAL/PLATELET
Abs Immature Granulocytes: 0.02 10*3/uL (ref 0.00–0.07)
Basophils Absolute: 0 10*3/uL (ref 0.0–0.1)
Basophils Relative: 0 %
Eosinophils Absolute: 0 10*3/uL (ref 0.0–0.5)
Eosinophils Relative: 0 %
HCT: 27.5 % — ABNORMAL LOW (ref 39.0–52.0)
Hemoglobin: 8.4 g/dL — ABNORMAL LOW (ref 13.0–17.0)
Immature Granulocytes: 1 %
Lymphocytes Relative: 24 %
Lymphs Abs: 0.9 10*3/uL (ref 0.7–4.0)
MCH: 28 pg (ref 26.0–34.0)
MCHC: 30.5 g/dL (ref 30.0–36.0)
MCV: 91.7 fL (ref 80.0–100.0)
Monocytes Absolute: 0.1 10*3/uL (ref 0.1–1.0)
Monocytes Relative: 3 %
Neutro Abs: 2.6 10*3/uL (ref 1.7–7.7)
Neutrophils Relative %: 72 %
Platelets: 262 10*3/uL (ref 150–400)
RBC: 3 MIL/uL — ABNORMAL LOW (ref 4.22–5.81)
RDW: 17.4 % — ABNORMAL HIGH (ref 11.5–15.5)
WBC: 3.6 10*3/uL — ABNORMAL LOW (ref 4.0–10.5)
nRBC: 0.6 % — ABNORMAL HIGH (ref 0.0–0.2)

## 2023-03-04 LAB — COMPREHENSIVE METABOLIC PANEL
ALT: 16 U/L (ref 0–44)
AST: 18 U/L (ref 15–41)
Albumin: 3.5 g/dL (ref 3.5–5.0)
Alkaline Phosphatase: 62 U/L (ref 38–126)
Anion gap: 10 (ref 5–15)
BUN: 9 mg/dL (ref 6–20)
CO2: 24 mmol/L (ref 22–32)
Calcium: 9.1 mg/dL (ref 8.9–10.3)
Chloride: 103 mmol/L (ref 98–111)
Creatinine, Ser: 0.84 mg/dL (ref 0.61–1.24)
GFR, Estimated: >60 mL/min (ref >60)
Glucose, Bld: 123 mg/dL — ABNORMAL HIGH (ref 70–99)
Potassium: 4.6 mmol/L (ref 3.5–5.1)
Sodium: 137 mmol/L (ref 135–145)
Total Bilirubin: 0.8 mg/dL (ref 0.3–1.2)
Total Protein: 6.7 g/dL (ref 6.5–8.1)

## 2023-03-04 LAB — PHOSPHORUS: Phosphorus: 3.4 mg/dL (ref 2.5–4.6)

## 2023-03-04 LAB — TYPE AND SCREEN
ABO/RH(D): AB POS
Antibody Screen: NEGATIVE

## 2023-03-04 LAB — MAGNESIUM: Magnesium: 2.1 mg/dL (ref 1.7–2.4)

## 2023-03-04 MED ORDER — CHLORHEXIDINE GLUCONATE CLOTH 2 % EX PADS
6.0000 | MEDICATED_PAD | Freq: Every day | CUTANEOUS | Status: DC
  Administered 2023-03-04 – 2023-03-05 (×2): 6 via TOPICAL

## 2023-03-04 MED ORDER — SODIUM BICARBONATE/SODIUM CHLORIDE MOUTHWASH
Freq: Four times a day (QID) | OROMUCOSAL | Status: DC
  Filled 2023-03-04 (×2): qty 1000

## 2023-03-04 MED ORDER — SENNA 8.6 MG PO TABS
2.0000 | ORAL_TABLET | Freq: Two times a day (BID) | ORAL | Status: DC
  Administered 2023-03-04 – 2023-03-06 (×3): 17.2 mg via ORAL
  Filled 2023-03-04 (×7): qty 2

## 2023-03-04 MED ORDER — MAGNESIUM CITRATE PO SOLN
0.5000 | Freq: Once | ORAL | Status: AC | PRN
  Administered 2023-03-04: 0.5 via ORAL
  Filled 2023-03-04: qty 296

## 2023-03-04 MED ORDER — SODIUM CHLORIDE 0.9 % IV SOLN
Freq: Once | INTRAVENOUS | Status: AC
  Administered 2023-03-04: 8 mg via INTRAVENOUS
  Filled 2023-03-04: qty 4

## 2023-03-04 MED ORDER — VINCRISTINE SULFATE CHEMO INJECTION 1 MG/ML
Freq: Once | INTRAVENOUS | Status: AC
  Filled 2023-03-04: qty 13

## 2023-03-04 NOTE — Progress Notes (Signed)
Mobility Specialist - Progress Note   03/04/23 1154  Mobility  Activity Ambulated with assistance in hallway  Level of Assistance Modified independent, requires aide device or extra time  Assistive Device None  Distance Ambulated (ft) 600 ft  Activity Response Tolerated well  Mobility Referral Yes  $Mobility charge 1 Mobility   Pt received in bed and agreed to mobility. Had no c/o pain nor discomfort during session. Pt returned to bed with all needs met.   Roderick Pee Mobility Specialist

## 2023-03-04 NOTE — Progress Notes (Signed)
Rate adjusted for overfill, please keep at 61ml/hr

## 2023-03-04 NOTE — H&P (Signed)
HEMATOLOGY/ONCOLOGY H&P NOTE  Date of Service: 03/04/2023  Inpatient Attending: .Brunetta Genera, MD   HPI Logan Ward is a wonderful 42 y.o. male with a history of HIV/AIDS(CD count of 190 on 12/19/2022) on Biktarvy who is being admitted for cycle 6 of inpatient dose adjusted EPOCH-R chemotherapy.  Patient tolerated cycle 5 of EPOCH-R chemoimmunotherapy without any immediate side effects. However he was recently admitted with neutropenic fever and back pain thought to be related to his G-CSF. No overt source of infection noted.  Back pain is resolved.  Patient is here to complete his last cycle of chemotherapy and is excited for this.  His mom is at bedside with him.  Currently patient has no other acute new symptoms.  No new headaches.  No new lumps or bumps.  No fevers no chills no night sweats.  No new mouth sores.  Labs done today were discussed in detail with the patient and show anemia with a hemoglobin of 7.8.  No chest pain or shortness of breath.   PHYSICAL EXAMINATION: . Vitals:   03/03/23 1359 03/03/23 1412 03/03/23 2215  BP:  125/75 122/65  Pulse:  89 80  Resp:  20 14  Temp:  98.8 F (37.1 C) 98.6 F (37 C)  TempSrc:  Oral Oral  SpO2:  100% 100%  Weight: 267 lb 10.2 oz (121.4 kg)    Height: 6\' 1"  (1.854 m)     Filed Weights   03/03/23 1359  Weight: 267 lb 10.2 oz (121.4 kg)   .Body mass index is 35.31 kg/m. [name] GENERAL:alert, in no acute distress and comfortable SKIN: no acute rashes, no significant lesions EYES: conjunctiva are pink and non-injected, sclera anicteric OROPHARYNX: MMM, no exudates, no oropharyngeal erythema or ulceration NECK: supple, no JVD LYMPH:  no palpable lymphadenopathy in the cervical, axillary or inguinal regions LUNGS: clear to auscultation b/l with normal respiratory effort HEART: regular rate & rhythm ABDOMEN:  normoactive bowel sounds , non tender, not distended. Extremity: no pedal edema PSYCH: alert & oriented x 3  with fluent speech NEURO: no focal motor/sensory deficits  MEDICAL HISTORY:  Past Medical History:  Diagnosis Date   Anal fissure    HIV infection ([place])    Internal hemorrhoids    Obesity    Rectal ulcer     SURGICAL HISTORY: Past Surgical History:  Procedure Laterality Date   IR IMAGING GUIDED PORT INSERTION  11/18/2022    SOCIAL HISTORY: Social History   Socioeconomic History   Marital status: Significant Other    Spouse name: Not on file   Number of children: Not on file   Years of education: Not on file   Highest education level: Not on file  Occupational History   Not on file  Tobacco Use   Smoking status: Never   Smokeless tobacco: Never  Vaping Use   Vaping Use: Never used  Substance and Sexual Activity   Alcohol use: No   Drug use: No   Sexual activity: Yes    Comment: declined condoms  Other Topics Concern   Not on file  Social History Narrative   Not on file   Social Determinants of Health   Financial Resource Strain: Low Risk  (10/24/2022)   Overall Financial Resource Strain (CARDIA)    Difficulty of Paying Living Expenses: Not hard at all  Food Insecurity: No Food Insecurity (03/03/2023)   Hunger Vital Sign    Worried About Running Out of Food in the Last Year: Never  true    Ran Out of Food in the Last Year: Never true  Transportation Needs: No Transportation Needs (03/03/2023)   PRAPARE - Hydrologist (Medical): No    Lack of Transportation (Non-Medical): No  Physical Activity: Sufficiently Active (10/24/2022)   Exercise Vital Sign    Days of Exercise per Week: 3 days    Minutes of Exercise per Session: 60 min  Recent Concern: Physical Activity - Insufficiently Active (10/24/2022)   Exercise Vital Sign    Days of Exercise per Week: 3 days    Minutes of Exercise per Session: 20 min  Stress: No Stress Concern Present (10/24/2022)   Amorita  of Stress : Not at all  Social Connections: Unknown (10/24/2022)   Social Connection and Isolation Panel [NHANES]    Frequency of Communication with Friends and Family: Twice a week    Frequency of Social Gatherings with Friends and Family: Twice a week    Attends Religious Services: 1 to 4 times per year    Active Member of [employer] or Organizations: No    Attends Archivist Meetings: 1 to 4 times per year    Marital Status: Patient declined  Intimate Partner Violence: Not At Risk (03/03/2023)   Humiliation, Afraid, Rape, and Kick questionnaire    Fear of Current or Ex-Partner: No    Emotionally Abused: No    Physically Abused: No    Sexually Abused: No    FAMILY HISTORY: Family History  Problem Relation Age of Onset   Hypertension Father    Throat cancer Father    Heart attack Brother    Colon cancer Neg Hx    Esophageal cancer Neg Hx    Stomach cancer Neg Hx     ALLERGIES:  has No Known Allergies.  MEDICATIONS:  Scheduled Meds:  B-complex with vitamin C  1 tablet Oral Daily   bictegravir-emtricitabine-tenofovir AF  1 tablet Oral Daily   DOXOrubicin (ADRIAMYCIN) 26 mg, etoposide (VEPESID) 102 mg, vinCRIStine (ONCOVIN) 1 mg in sodium chloride 0.9 % 1,000 mL chemo infusion   Intravenous Once   sulfamethoxazole-trimethoprim  1 tablet Oral Daily   Continuous Infusions:  sodium chloride 20 mL/hr at 03/03/23 1618   PRN Meds:.acetaminophen, alum & mag hydroxide-simeth, Cold Pack, Hot Pack, hydrocortisone, senna-docusate  REVIEW OF SYSTEMS:    .10 Point review of Systems was done is negative except as noted above.  LABORATORY DATA:  I have reviewed the data as listed  .    Latest Ref Rng & Units 03/03/2023    3:02 PM 02/23/2023    1:47 AM 02/22/2023    5:48 AM  CBC  WBC 4.0 - 10.5 K/uL 3.2  3.6  2.0   Hemoglobin 13.0 - 17.0 g/dL 7.8  8.0  7.9   Hematocrit 39.0 - 52.0 % 25.9  25.2  24.7   Platelets 150 - 400 K/uL 212  48  40    .    Latest Ref Rng & Units  03/03/2023    3:02 PM 02/22/2023    5:48 AM 02/21/2023    9:17 AM  CMP  Glucose 70 - 99 mg/dL 114  106  114   BUN 6 - 20 mg/dL 7  15  14    Creatinine 0.61 - 1.24 mg/dL 0.95  1.01  0.99   Sodium 135 - 145 mmol/L 137  128  134   Potassium 3.5 -  5.1 mmol/L 3.7  3.6  3.7   Chloride 98 - 111 mmol/L 108  96  99   CO2 22 - 32 mmol/L 26  25  25    Calcium 8.9 - 10.3 mg/dL 8.2  8.3  8.9   Total Protein 6.5 - 8.1 g/dL 6.1   6.6   Total Bilirubin 0.3 - 1.2 mg/dL 0.6   0.9   Alkaline Phos 38 - 126 U/L 55   45   AST 15 - 41 U/L 20   29   ALT 0 - 44 U/L 16   23        RADIOGRAPHIC STUDIES: I have personally reviewed the radiological images as listed and agreed with the findings in the report. CT Renal Stone Study  Result Date: 02/21/2023 CLINICAL DATA:  Abdominal pain, back pain EXAM: CT ABDOMEN AND PELVIS WITHOUT CONTRAST TECHNIQUE: Multidetector CT imaging of the abdomen and pelvis was performed following the standard protocol without IV contrast. RADIATION DOSE REDUCTION: This exam was performed according to the departmental dose-optimization program which includes automated exposure control, adjustment of the mA and/or kV according to patient size and/or use of iterative reconstruction technique. COMPARISON:  11/10/2022 FINDINGS: Lower chest: There is no focal consolidation in the lower lung fields. Coronary artery calcifications are seen. Hepatobiliary: No focal abnormalities are seen in liver. There is no dilation of bile ducts. Gallbladder is unremarkable. Pancreas: No focal abnormalities are seen. Spleen: Unremarkable. Adrenals/Urinary Tract: Adrenals are unremarkable. There is no hydronephrosis. There are no renal or ureteral stones. There is increased density in the lumen of urinary bladder, possibly related to previously administered contrast. Stomach/Bowel: Stomach is not distended. Small bowel loops are not dilated. Appendix is not dilated. There is no significant wall thickening in colon. There  is no pericolic stranding. Vascular/Lymphatic: There are scattered subcentimeter nodes in mesentery and retroperitoneum which appear less prominent. No new significant lymphadenopathy is seen. Vascular structures are unremarkable. Reproductive: Unremarkable. Other: There is no ascites or pneumoperitoneum. Small umbilical hernia containing fat is seen. Bilateral inguinal hernias containing fat are noted, larger on the left side. Musculoskeletal: No acute findings are seen. IMPRESSION: There is no evidence of intestinal obstruction or pneumoperitoneum. There is no hydronephrosis. Appendix is not dilated. Coronary artery calcifications are seen. Electronically Signed   By: Elmer Picker M.D.   On: 02/21/2023 15:50   MR Lumbar Spine W Wo Contrast  Result Date: 02/21/2023 CLINICAL DATA:  Low back pain, weakness in both legs. History of Burkitt's lymphoma and HIV. EXAM: MRI LUMBAR SPINE WITHOUT AND WITH CONTRAST TECHNIQUE: Multiplanar and multiecho pulse sequences of the lumbar spine were obtained without and with intravenous contrast. CONTRAST:  59mL GADAVIST GADOBUTROL 1 MMOL/ML IV SOLN COMPARISON:  PET-CT 01/17/2023 FINDINGS: Segmentation: Standard; the lowest formed disc space is designated L5-S1. Alignment:  Normal. Vertebrae: Vertebral body heights are preserved. Background marrow signal is normal. There is no suspicious marrow signal abnormality, marrow edema, or marrow enhancement. Conus medullaris and cauda equina: Conus extends to the L1 level. Conus and cauda equina appear normal. There is no abnormal enhancement of the cauda equina nerve roots. Paraspinal and other soft tissues: Unremarkable. Disc levels: There is disc desiccation without significant loss of height at L5-S1. The other disc spaces are preserved T12-L1: Unremarkable L1-L2: Unremarkable. L2-L3: Unremarkable. L3-L4: Unremarkable. L4-L5: There is a mild disc bulge and minimal facet arthropathy resulting in mild right worse than left neural  foraminal stenosis without significant spinal canal stenosis L5-S1: There is a disc  bulge with a central annular fissure, mild degenerative endplate spurring, and mild facet arthropathy resulting in mild to moderate right and mild left neural foraminal stenosis without significant spinal canal stenosis. IMPRESSION: 1. No suspicious marrow signal abnormality or abnormal enhancement. 2. Mild degenerative change at L4-L5 and L5-S1 with mild-to-moderate right and mild left neural foraminal stenosis at L5-S1. Electronically Signed   By: Valetta Mole M.D.   On: 02/21/2023 11:34    ASSESSMENT & PLAN:   42 year old male with HIV/AIDS with newly diagnosed Burkitt's lymphoma   #1  Stage II recently diagnosed Burkitt's lymphoma Presenting with rapidly growing right neck mass extending into the chest, bilateral x-ray lymphadenopathy and also concern for possible involvement of retroperitoneal lymph nodes. Concerning for at least stage II possibly stage III disease. Some borderline lymphadenopathy in the abdomen could also be from his recently diagnosed HIV/AIDS. No clinical symptoms suggestive of CNS involvement at this time. Brain MRI negative.  No constitutional symptoms. No significant cytopenias to suggest bone marrow involvement. Discussed with patient and he is not interested in fertility preservation or sperm banking. First presented in August at [facility] and was treated empirically with steroids and antibiotics with improvement in swelling prior to additional progression.  #2 HIV/AIDS recently diagnosed about 77months ago. Following Dr. Elna Breslow from [place] and currently taking Biktarvy.   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  Patient reports this was completely treated.  #4 Prophylaxis: Continue salt water and baking soda mouthwash rinses 4 times a day to reduce mucositis.  Senna-s as for constipation Bactrim for prophylactic PJP.    #5 Pancytopenia related to chemotherapy --  thrombocytopenia and neutropenia resolved. Still has normocytic anemia Cytopenias accentuated by his HIV/AIDS and related medications    PLAN: -Discussed lab results from today, 03/03/2023 CBC shows the patient is anemic with hemoglobin of 7.8 not significantly symptomatic -- will monitor -We will watch his CBC and patient will receive blood transfusion if hemoglobin drops below 7.5. Patient's agrees. --Patient does not need blood transfusion as of right now.  -okay to proceed with C6 of EPOCH-R with dose adjustments -reduced dose etoposide from 50 mg/m^2 to 40 mg/m^2. -reduced cyclophosphamide from 750 mg/m^2 to 500 mg/m^2. -chemotherapy orders reviewed and signed and co-ordinated with pharmacist. -outpatient Rituxan and G-CSF on 03/10/2023 --Recommended to stay well-hydrated. Drink at least 2L of water. -NACL/NAHCOS mouthwash QID -patient declined lovenox for VTE prophylaxis and was recommeded to ambulated and use SCD. -senna-s to address constipation.  .The total time spent in the appointment was 75 minutes* .  All of the patient's questions were answered with apparent satisfaction. The patient knows to call the clinic with any problems, questions or concerns.   Sullivan Lone MD MS AAHIVMS [facility] Hematology/Oncology Physician [facility]  .*Total Encounter Time as defined by the Centers for Medicare and Medicaid Services includes, in addition to the face-to-face time of a patient visit (documented in the note above) non-face-to-face time: obtaining and reviewing outside history, ordering and reviewing medications, tests or procedures, care coordination (communications with other health care professionals or caregivers) and documentation in the medical record.

## 2023-03-04 NOTE — Progress Notes (Signed)
Day 2  Chemotherapy  Administered premeds per orders.  Blood return noted via port.  Administered Doxorubicin 26 mg, etoposide 102 mg, and vincristine 1 mg via port.    Pt has no signs and symptoms.  VS stable.   Will continue to monitor.

## 2023-03-05 ENCOUNTER — Encounter: Payer: Self-pay | Admitting: Hematology

## 2023-03-05 DIAGNOSIS — Z5111 Encounter for antineoplastic chemotherapy: Secondary | ICD-10-CM | POA: Diagnosis not present

## 2023-03-05 DIAGNOSIS — C8378 Burkitt lymphoma, lymph nodes of multiple sites: Secondary | ICD-10-CM | POA: Diagnosis not present

## 2023-03-05 LAB — CBC WITH DIFFERENTIAL/PLATELET
Abs Immature Granulocytes: 0.02 10*3/uL (ref 0.00–0.07)
Basophils Absolute: 0 10*3/uL (ref 0.0–0.1)
Basophils Relative: 0 %
Eosinophils Absolute: 0 10*3/uL (ref 0.0–0.5)
Eosinophils Relative: 0 %
HCT: 26.1 % — ABNORMAL LOW (ref 39.0–52.0)
Hemoglobin: 7.9 g/dL — ABNORMAL LOW (ref 13.0–17.0)
Immature Granulocytes: 1 %
Lymphocytes Relative: 15 %
Lymphs Abs: 0.5 10*3/uL — ABNORMAL LOW (ref 0.7–4.0)
MCH: 27.8 pg (ref 26.0–34.0)
MCHC: 30.3 g/dL (ref 30.0–36.0)
MCV: 91.9 fL (ref 80.0–100.0)
Monocytes Absolute: 0.2 10*3/uL (ref 0.1–1.0)
Monocytes Relative: 5 %
Neutro Abs: 2.6 10*3/uL (ref 1.7–7.7)
Neutrophils Relative %: 79 %
Platelets: 289 10*3/uL (ref 150–400)
RBC: 2.84 MIL/uL — ABNORMAL LOW (ref 4.22–5.81)
RDW: 17.5 % — ABNORMAL HIGH (ref 11.5–15.5)
WBC: 3.2 10*3/uL — ABNORMAL LOW (ref 4.0–10.5)
nRBC: 0 % (ref 0.0–0.2)

## 2023-03-05 LAB — COMPREHENSIVE METABOLIC PANEL
ALT: 15 U/L (ref 0–44)
AST: 17 U/L (ref 15–41)
Albumin: 3.3 g/dL — ABNORMAL LOW (ref 3.5–5.0)
Alkaline Phosphatase: 52 U/L (ref 38–126)
Anion gap: 9 (ref 5–15)
BUN: 10 mg/dL (ref 6–20)
CO2: 22 mmol/L (ref 22–32)
Calcium: 8.6 mg/dL — ABNORMAL LOW (ref 8.9–10.3)
Chloride: 105 mmol/L (ref 98–111)
Creatinine, Ser: 0.82 mg/dL (ref 0.61–1.24)
GFR, Estimated: >60 mL/min (ref >60)
Glucose, Bld: 131 mg/dL — ABNORMAL HIGH (ref 70–99)
Potassium: 4.3 mmol/L (ref 3.5–5.1)
Sodium: 136 mmol/L (ref 135–145)
Total Bilirubin: 1 mg/dL (ref 0.3–1.2)
Total Protein: 6.5 g/dL (ref 6.5–8.1)

## 2023-03-05 MED ORDER — SODIUM CHLORIDE 0.9 % IV SOLN
Freq: Once | INTRAVENOUS | Status: AC
  Administered 2023-03-05: 18 mg via INTRAVENOUS
  Filled 2023-03-05: qty 4

## 2023-03-05 MED ORDER — HEPARIN SOD (PORK) LOCK FLUSH 100 UNIT/ML IV SOLN: 500.0000 [IU] | Freq: Once | INTRAVENOUS | Status: DC

## 2023-03-05 MED ORDER — VINCRISTINE SULFATE CHEMO INJECTION 1 MG/ML
Freq: Once | INTRAVENOUS | Status: AC
  Filled 2023-03-05: qty 13

## 2023-03-05 NOTE — Progress Notes (Signed)
[facility]  HEMATOLOGY/ONCOLOGY INPATIENT PROGRESS NOTE  Date of Service: 03/05/2023  Inpatient Attending: .Brunetta Genera, MD   SUBJECTIVE  Patient was seen in medical oncology follow-up for cycle 6-day 3 of EPOCH-R chemotherapy.  He notes that he is on a good day today with no uncontrolled nausea or vomiting.  Good p.o. intake.  No chest pain or shortness of breath.  No new leg swelling.  No other acute new focal symptoms.  Has been using his mouthwash.  Continues to take his laxatives to try to help with his constipation.   OBJECTIVE:  NAD  PHYSICAL EXAMINATION: .BP (!) 115/59 (BP Location: Left Arm)   Pulse 64   Temp 97.9 F (36.6 C) (Oral)   Resp 18   Ht 6\' 1"  (1.854 m)   Wt 267 lb 10.2 oz (121.4 kg)   SpO2 100%   BMI 35.31 kg/m  .OROPHARYNX: MMM, no exudates, no oropharyngeal erythema or ulceration NECK: supple, no JVD LYMPH:  no palpable lymphadenopathy in the cervical, axillary or inguinal regions LUNGS: clear to auscultation b/l with normal respiratory effort HEART: regular rate & rhythm ABDOMEN:  normoactive bowel sounds , non tender, not distended. Extremity: no pedal edema PSYCH: alert & oriented x 3 with fluent speech NEURO: no focal motor/sensory deficits    MEDICAL HISTORY:  Past Medical History:  Diagnosis Date   Anal fissure    HIV infection ([place])    Internal hemorrhoids    Obesity    Rectal ulcer     SURGICAL HISTORY: Past Surgical History:  Procedure Laterality Date   IR IMAGING GUIDED PORT INSERTION  11/18/2022    SOCIAL HISTORY: Social History   Socioeconomic History   Marital status: Significant Other    Spouse name: Not on file   Number of children: Not on file   Years of education: Not on file   Highest education level: Not on file  Occupational History   Not on file  Tobacco Use   Smoking status: Never   Smokeless tobacco: Never  Vaping Use   Vaping Use: Never used  Substance and Sexual Activity   Alcohol use: No   Drug  use: No   Sexual activity: Yes    Comment: declined condoms  Other Topics Concern   Not on file  Social History Narrative   Not on file   Social Determinants of Health   Financial Resource Strain: Low Risk  (10/24/2022)   Overall Financial Resource Strain (CARDIA)    Difficulty of Paying Living Expenses: Not hard at all  Food Insecurity: No Food Insecurity (03/03/2023)   Hunger Vital Sign    Worried About Running Out of Food in the Last Year: Never true    Ran Out of Food in the Last Year: Never true  Transportation Needs: No Transportation Needs (03/03/2023)   PRAPARE - Hydrologist (Medical): No    Lack of Transportation (Non-Medical): No  Physical Activity: Sufficiently Active (10/24/2022)   Exercise Vital Sign    Days of Exercise per Week: 3 days    Minutes of Exercise per Session: 60 min  Recent Concern: Physical Activity - Insufficiently Active (10/24/2022)   Exercise Vital Sign    Days of Exercise per Week: 3 days    Minutes of Exercise per Session: 20 min  Stress: No Stress Concern Present (10/24/2022)   [place]    Feeling of Stress : Not at all  Social Connections:  Unknown (10/24/2022)   Social Connection and Isolation Panel [NHANES]    Frequency of Communication with Friends and Family: Twice a week    Frequency of Social Gatherings with Friends and Family: Twice a week    Attends Religious Services: 1 to 4 times per year    Active Member of [employer] or Organizations: No    Attends Archivist Meetings: 1 to 4 times per year    Marital Status: Patient declined  Intimate Partner Violence: Not At Risk (03/03/2023)   Humiliation, Afraid, Rape, and Kick questionnaire    Fear of Current or Ex-Partner: No    Emotionally Abused: No    Physically Abused: No    Sexually Abused: No    FAMILY HISTORY: Family History  Problem Relation Age of Onset   Hypertension Father     Throat cancer Father    Heart attack Brother    Colon cancer Neg Hx    Esophageal cancer Neg Hx    Stomach cancer Neg Hx     ALLERGIES:  has No Known Allergies.  MEDICATIONS:  Scheduled Meds:  B-complex with vitamin C  1 tablet Oral Daily   bictegravir-emtricitabine-tenofovir AF  1 tablet Oral Daily   Chlorhexidine Gluconate Cloth  6 each Topical Daily   DOXOrubicin (ADRIAMYCIN) 26 mg, etoposide (VEPESID) 102 mg, vinCRIStine (ONCOVIN) 1 mg in sodium chloride 0.9 % 1,000 mL chemo infusion   Intravenous Once   senna  2 tablet Oral BID   sodium bicarbonate/sodium chloride   Mouth Rinse QID   sulfamethoxazole-trimethoprim  1 tablet Oral Daily   Continuous Infusions:  sodium chloride 20 mL/hr at 03/04/23 0203   PRN Meds:.acetaminophen, alum & mag hydroxide-simeth, Cold Pack, Hot Pack, hydrocortisone, senna-docusate  REVIEW OF SYSTEMS:   .10 Point review of Systems was done is negative except as noted above.  LABORATORY DATA:  I have reviewed the data as listed  .    Latest Ref Rng & Units 03/05/2023    5:00 AM 03/04/2023    4:40 AM 03/03/2023    3:02 PM  CBC  WBC 4.0 - 10.5 K/uL 3.2  3.6  3.2   Hemoglobin 13.0 - 17.0 g/dL 7.9  8.4  7.8   Hematocrit 39.0 - 52.0 % 26.1  27.5  25.9   Platelets 150 - 400 K/uL 289  262  212    .    Latest Ref Rng & Units 03/05/2023    5:00 AM 03/04/2023    4:40 AM 03/03/2023    3:02 PM  CMP  Glucose 70 - 99 mg/dL 131  123  114   BUN 6 - 20 mg/dL 10  9  7    Creatinine 0.61 - 1.24 mg/dL 0.82  0.84  0.95   Sodium 135 - 145 mmol/L 136  137  137   Potassium 3.5 - 5.1 mmol/L 4.3  4.6  3.7   Chloride 98 - 111 mmol/L 105  103  108   CO2 22 - 32 mmol/L 22  24  26    Calcium 8.9 - 10.3 mg/dL 8.6  9.1  8.2   Total Protein 6.5 - 8.1 g/dL 6.5  6.7  6.1   Total Bilirubin 0.3 - 1.2 mg/dL 1.0  0.8  0.6   Alkaline Phos 38 - 126 U/L 52  62  55   AST 15 - 41 U/L 17  18  20    ALT 0 - 44 U/L 15  16  16      RADIOGRAPHIC STUDIES:  I have personally reviewed  the radiological images as listed and agreed with the findings in the report. CT Renal Stone Study  Result Date: 02/21/2023 CLINICAL DATA:  Abdominal pain, back pain EXAM: CT ABDOMEN AND PELVIS WITHOUT CONTRAST TECHNIQUE: Multidetector CT imaging of the abdomen and pelvis was performed following the standard protocol without IV contrast. RADIATION DOSE REDUCTION: This exam was performed according to the departmental dose-optimization program which includes automated exposure control, adjustment of the mA and/or kV according to patient size and/or use of iterative reconstruction technique. COMPARISON:  11/10/2022 FINDINGS: Lower chest: There is no focal consolidation in the lower lung fields. Coronary artery calcifications are seen. Hepatobiliary: No focal abnormalities are seen in liver. There is no dilation of bile ducts. Gallbladder is unremarkable. Pancreas: No focal abnormalities are seen. Spleen: Unremarkable. Adrenals/Urinary Tract: Adrenals are unremarkable. There is no hydronephrosis. There are no renal or ureteral stones. There is increased density in the lumen of urinary bladder, possibly related to previously administered contrast. Stomach/Bowel: Stomach is not distended. Small bowel loops are not dilated. Appendix is not dilated. There is no significant wall thickening in colon. There is no pericolic stranding. Vascular/Lymphatic: There are scattered subcentimeter nodes in mesentery and retroperitoneum which appear less prominent. No new significant lymphadenopathy is seen. Vascular structures are unremarkable. Reproductive: Unremarkable. Other: There is no ascites or pneumoperitoneum. Small umbilical hernia containing fat is seen. Bilateral inguinal hernias containing fat are noted, larger on the left side. Musculoskeletal: No acute findings are seen. IMPRESSION: There is no evidence of intestinal obstruction or pneumoperitoneum. There is no hydronephrosis. Appendix is not dilated. Coronary artery  calcifications are seen. Electronically Signed   By: Elmer Picker M.D.   On: 02/21/2023 15:50   MR Lumbar Spine W Wo Contrast  Result Date: 02/21/2023 CLINICAL DATA:  Low back pain, weakness in both legs. History of Burkitt's lymphoma and HIV. EXAM: MRI LUMBAR SPINE WITHOUT AND WITH CONTRAST TECHNIQUE: Multiplanar and multiecho pulse sequences of the lumbar spine were obtained without and with intravenous contrast. CONTRAST:  52mL GADAVIST GADOBUTROL 1 MMOL/ML IV SOLN COMPARISON:  PET-CT 01/17/2023 FINDINGS: Segmentation: Standard; the lowest formed disc space is designated L5-S1. Alignment:  Normal. Vertebrae: Vertebral body heights are preserved. Background marrow signal is normal. There is no suspicious marrow signal abnormality, marrow edema, or marrow enhancement. Conus medullaris and cauda equina: Conus extends to the L1 level. Conus and cauda equina appear normal. There is no abnormal enhancement of the cauda equina nerve roots. Paraspinal and other soft tissues: Unremarkable. Disc levels: There is disc desiccation without significant loss of height at L5-S1. The other disc spaces are preserved T12-L1: Unremarkable L1-L2: Unremarkable. L2-L3: Unremarkable. L3-L4: Unremarkable. L4-L5: There is a mild disc bulge and minimal facet arthropathy resulting in mild right worse than left neural foraminal stenosis without significant spinal canal stenosis L5-S1: There is a disc bulge with a central annular fissure, mild degenerative endplate spurring, and mild facet arthropathy resulting in mild to moderate right and mild left neural foraminal stenosis without significant spinal canal stenosis. IMPRESSION: 1. No suspicious marrow signal abnormality or abnormal enhancement. 2. Mild degenerative change at L4-L5 and L5-S1 with mild-to-moderate right and mild left neural foraminal stenosis at L5-S1. Electronically Signed   By: Valetta Mole M.D.   On: 02/21/2023 11:34    ASSESSMENT & PLAN:   42 year old male  with HIV/AIDS with newly diagnosed Burkitt's lymphoma   #1  Stage II recently diagnosed Burkitt's lymphoma Presenting with rapidly growing right neck mass extending  into the chest, bilateral x-ray lymphadenopathy and also concern for possible involvement of retroperitoneal lymph nodes. Concerning for at least stage II possibly stage III disease. Some borderline lymphadenopathy in the abdomen could also be from his recently diagnosed HIV/AIDS. No clinical symptoms suggestive of CNS involvement at this time. Brain MRI negative.  No constitutional symptoms. No significant cytopenias to suggest bone marrow involvement. Discussed with patient and he is not interested in fertility preservation or sperm banking. First presented in August at [facility] and was treated empirically with steroids and antibiotics with improvement in swelling prior to additional progression.    #2 HIV/AIDS recently diagnosed 6 months ago. Following Dr. Elna Breslow from [place] and currently taking Biktarvy.   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  Patient reports this was completely treated.   #4 Prophylaxis: Continue salt water and baking soda mouthwash rinses 4 times a day to reduce mucositis.  MiraLAX and senna as for constipation Bactrim for prophylactic PJP.    #5 Pancytopenia related to chemotherapy -- thrombocytopenia and neutropenia resolved. Still has normocytic anemia Cytopenias accentuated by his HIV/AIDS and related medications    PLAN: Discussed lab results from today. CBC shows anemia with a hemoglobin of 7.9 with WBC count of 3.2 and platelets of 289k -transfuse PRBC prn for hgb<7.5 No new significant toxicities of chemotherapy and patient is appropriate to continue cycle 6-day 3 of EPOCH -outpatient Rituxan and Udencya on 03/10/2023 -NACL/NAHCOS mouthwash QID -No subcu Lovenox as per patient's preference.  He is ambulating every day to reduce risk of VTE.  Logan KitchenThe total time spent in the  appointment was 27 minutes* .  All of the patient's questions were answered with apparent satisfaction. The patient knows to call the clinic with any problems, questions or concerns.   Sullivan Lone MD MS AAHIVMS [facility] Hematology/Oncology Physician [facility]  .*Total Encounter Time as defined by the Centers for Medicare and Medicaid Services includes, in addition to the face-to-face time of a patient visit (documented in the note above) non-face-to-face time: obtaining and reviewing outside history, ordering and reviewing medications, tests or procedures, care coordination (communications with other health care professionals or caregivers) and documentation in the medical record.

## 2023-03-05 NOTE — Progress Notes (Signed)
Mobility Specialist - Progress Note   03/05/23 1220  Mobility  Activity Ambulated with assistance in hallway  Level of Assistance Independent after set-up  Assistive Device None  Distance Ambulated (ft) 500 ft  Activity Response Tolerated well  Mobility Referral Yes  $Mobility charge 1 Mobility   Pt received on bench and agreed to mobility with no c/o pain nor discomfort. Pt returned to bench with all needs met.   Roderick Pee Mobility Specialist

## 2023-03-05 NOTE — Progress Notes (Signed)
[name]  HEMATOLOGY/ONCOLOGY INPATIENT PROGRESS NOTE  Date of Service: 03/04/2023  Inpatient Attending: .Brunetta Genera, MD   SUBJECTIVE  Patient was seen in medical oncology follow-up for cycle 6-day 2 of EPOCH-R therapy. He notes no acute new symptoms. No BM today. NO n/V/D, Using mouthwash as directed Good po intake.  OBJECTIVE:  NAD  PHYSICAL EXAMINATION: .BP (!) 121/59 (BP Location: Left Arm)   Pulse 71   Temp 97.9 F (36.6 C) (Oral)   Resp 18   Ht 6\' 1"  (1.854 m)   Wt 267 lb 10.2 oz (121.4 kg)   SpO2 100%   BMI 35.31 kg/m  . GENERAL:alert, in no acute distress and comfortable NECK: supple, no JVD LYMPH:  no palpable lymphadenopathy in the cervical, axillary or inguinal regions LUNGS: clear to auscultation b/l with normal respiratory effort HEART: regular rate & rhythm ABDOMEN:  normoactive bowel sounds , non tender, not distended. Extremity: no pedal edema PSYCH: alert & oriented x 3 with fluent speech NEURO: no focal motor/sensory deficits   MEDICAL HISTORY:  Past Medical History:  Diagnosis Date   Anal fissure    HIV infection ([place])    Internal hemorrhoids    Obesity    Rectal ulcer     SURGICAL HISTORY: Past Surgical History:  Procedure Laterality Date   IR IMAGING GUIDED PORT INSERTION  11/18/2022    SOCIAL HISTORY: Social History   Socioeconomic History   Marital status: Significant Other    Spouse name: Not on file   Number of children: Not on file   Years of education: Not on file   Highest education level: Not on file  Occupational History   Not on file  Tobacco Use   Smoking status: Never   Smokeless tobacco: Never  Vaping Use   Vaping Use: Never used  Substance and Sexual Activity   Alcohol use: No   Drug use: No   Sexual activity: Yes    Comment: declined condoms  Other Topics Concern   Not on file  Social History Narrative   Not on file   Social Determinants of Health   Financial Resource Strain: Low Risk   (10/24/2022)   Overall Financial Resource Strain (CARDIA)    Difficulty of Paying Living Expenses: Not hard at all  Food Insecurity: No Food Insecurity (03/03/2023)   Hunger Vital Sign    Worried About Running Out of Food in the Last Year: Never true    Ran Out of Food in the Last Year: Never true  Transportation Needs: No Transportation Needs (03/03/2023)   PRAPARE - Hydrologist (Medical): No    Lack of Transportation (Non-Medical): No  Physical Activity: Sufficiently Active (10/24/2022)   Exercise Vital Sign    Days of Exercise per Week: 3 days    Minutes of Exercise per Session: 60 min  Recent Concern: Physical Activity - Insufficiently Active (10/24/2022)   Exercise Vital Sign    Days of Exercise per Week: 3 days    Minutes of Exercise per Session: 20 min  Stress: No Stress Concern Present (10/24/2022)   [place]    Feeling of Stress : Not at all  Social Connections: Unknown (10/24/2022)   Social Connection and Isolation Panel [NHANES]    Frequency of Communication with Friends and Family: Twice a week    Frequency of Social Gatherings with Friends and Family: Twice a week    Attends Religious Services: 1 to  4 times per year    Active Member of Clubs or Organizations: No    Attends Archivist Meetings: 1 to 4 times per year    Marital Status: Patient declined  Intimate Partner Violence: Not At Risk (03/03/2023)   Humiliation, Afraid, Rape, and Kick questionnaire    Fear of Current or Ex-Partner: No    Emotionally Abused: No    Physically Abused: No    Sexually Abused: No    FAMILY HISTORY: Family History  Problem Relation Age of Onset   Hypertension Father    Throat cancer Father    Heart attack Brother    Colon cancer Neg Hx    Esophageal cancer Neg Hx    Stomach cancer Neg Hx     ALLERGIES:  has No Known Allergies.  MEDICATIONS:  Scheduled Meds:  B-complex with  vitamin C  1 tablet Oral Daily   bictegravir-emtricitabine-tenofovir AF  1 tablet Oral Daily   Chlorhexidine Gluconate Cloth  6 each Topical Daily   DOXOrubicin (ADRIAMYCIN) 26 mg, etoposide (VEPESID) 102 mg, vinCRIStine (ONCOVIN) 1 mg in sodium chloride 0.9 % 1,000 mL chemo infusion   Intravenous Once   senna  2 tablet Oral BID   sodium bicarbonate/sodium chloride   Mouth Rinse QID   sulfamethoxazole-trimethoprim  1 tablet Oral Daily   Continuous Infusions:  sodium chloride 20 mL/hr at 03/04/23 0203   PRN Meds:.acetaminophen, alum & mag hydroxide-simeth, Cold Pack, Hot Pack, hydrocortisone, senna-docusate  REVIEW OF SYSTEMS:   .10 Point review of Systems was done is negative except as noted above.  LABORATORY DATA:  I have reviewed the data as listed  .    Latest Ref Rng & Units 03/04/2023    4:40 AM 03/03/2023    3:02 PM 02/23/2023    1:47 AM  CBC  WBC 4.0 - 10.5 K/uL 3.6  3.2  3.6   Hemoglobin 13.0 - 17.0 g/dL 8.4  7.8  8.0   Hematocrit 39.0 - 52.0 % 27.5  25.9  25.2   Platelets 150 - 400 K/uL 262  212  48    .    Latest Ref Rng & Units 03/04/2023    4:40 AM 03/03/2023    3:02 PM 02/22/2023    5:48 AM  CMP  Glucose 70 - 99 mg/dL 123  114  106   BUN 6 - 20 mg/dL 9  7  15    Creatinine 0.61 - 1.24 mg/dL 0.84  0.95  1.01   Sodium 135 - 145 mmol/L 137  137  128   Potassium 3.5 - 5.1 mmol/L 4.6  3.7  3.6   Chloride 98 - 111 mmol/L 103  108  96   CO2 22 - 32 mmol/L 24  26  25    Calcium 8.9 - 10.3 mg/dL 9.1  8.2  8.3   Total Protein 6.5 - 8.1 g/dL 6.7  6.1    Total Bilirubin 0.3 - 1.2 mg/dL 0.8  0.6    Alkaline Phos 38 - 126 U/L 62  55    AST 15 - 41 U/L 18  20    ALT 0 - 44 U/L 16  16      RADIOGRAPHIC STUDIES: I have personally reviewed the radiological images as listed and agreed with the findings in the report. CT Renal Stone Study  Result Date: 02/21/2023 CLINICAL DATA:  Abdominal pain, back pain EXAM: CT ABDOMEN AND PELVIS WITHOUT CONTRAST TECHNIQUE: Multidetector CT  imaging of the abdomen and pelvis was  performed following the standard protocol without IV contrast. RADIATION DOSE REDUCTION: This exam was performed according to the departmental dose-optimization program which includes automated exposure control, adjustment of the mA and/or kV according to patient size and/or use of iterative reconstruction technique. COMPARISON:  11/10/2022 FINDINGS: Lower chest: There is no focal consolidation in the lower lung fields. Coronary artery calcifications are seen. Hepatobiliary: No focal abnormalities are seen in liver. There is no dilation of bile ducts. Gallbladder is unremarkable. Pancreas: No focal abnormalities are seen. Spleen: Unremarkable. Adrenals/Urinary Tract: Adrenals are unremarkable. There is no hydronephrosis. There are no renal or ureteral stones. There is increased density in the lumen of urinary bladder, possibly related to previously administered contrast. Stomach/Bowel: Stomach is not distended. Small bowel loops are not dilated. Appendix is not dilated. There is no significant wall thickening in colon. There is no pericolic stranding. Vascular/Lymphatic: There are scattered subcentimeter nodes in mesentery and retroperitoneum which appear less prominent. No new significant lymphadenopathy is seen. Vascular structures are unremarkable. Reproductive: Unremarkable. Other: There is no ascites or pneumoperitoneum. Small umbilical hernia containing fat is seen. Bilateral inguinal hernias containing fat are noted, larger on the left side. Musculoskeletal: No acute findings are seen. IMPRESSION: There is no evidence of intestinal obstruction or pneumoperitoneum. There is no hydronephrosis. Appendix is not dilated. Coronary artery calcifications are seen. Electronically Signed   By: Elmer Picker M.D.   On: 02/21/2023 15:50   MR Lumbar Spine W Wo Contrast  Result Date: 02/21/2023 CLINICAL DATA:  Low back pain, weakness in both legs. History of Burkitt's lymphoma  and HIV. EXAM: MRI LUMBAR SPINE WITHOUT AND WITH CONTRAST TECHNIQUE: Multiplanar and multiecho pulse sequences of the lumbar spine were obtained without and with intravenous contrast. CONTRAST:  27mL GADAVIST GADOBUTROL 1 MMOL/ML IV SOLN COMPARISON:  PET-CT 01/17/2023 FINDINGS: Segmentation: Standard; the lowest formed disc space is designated L5-S1. Alignment:  Normal. Vertebrae: Vertebral body heights are preserved. Background marrow signal is normal. There is no suspicious marrow signal abnormality, marrow edema, or marrow enhancement. Conus medullaris and cauda equina: Conus extends to the L1 level. Conus and cauda equina appear normal. There is no abnormal enhancement of the cauda equina nerve roots. Paraspinal and other soft tissues: Unremarkable. Disc levels: There is disc desiccation without significant loss of height at L5-S1. The other disc spaces are preserved T12-L1: Unremarkable L1-L2: Unremarkable. L2-L3: Unremarkable. L3-L4: Unremarkable. L4-L5: There is a mild disc bulge and minimal facet arthropathy resulting in mild right worse than left neural foraminal stenosis without significant spinal canal stenosis L5-S1: There is a disc bulge with a central annular fissure, mild degenerative endplate spurring, and mild facet arthropathy resulting in mild to moderate right and mild left neural foraminal stenosis without significant spinal canal stenosis. IMPRESSION: 1. No suspicious marrow signal abnormality or abnormal enhancement. 2. Mild degenerative change at L4-L5 and L5-S1 with mild-to-moderate right and mild left neural foraminal stenosis at L5-S1. Electronically Signed   By: Valetta Mole M.D.   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 Concerning for at least stage II possibly stage III disease. Some borderline lymphadenopathy in the abdomen could also be from his recently diagnosed HIV/AIDS. No clinical symptoms suggestive of CNS involvement at this time.  Brain MRI negative.  No constitutional symptoms. No significant cytopenias to suggest bone marrow involvement. Discussed with patient and he is not interested in fertility preservation or sperm banking. First presented in August at [facility] and was treated empirically with steroids and antibiotics with improvement in swelling prior to additional progression.    #2 HIV/AIDS recently diagnosed 6 months ago. Following Dr. Elna Breslow from [place] and currently taking Biktarvy.   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  Patient reports this was completely treated.   #4 Prophylaxis: Continue salt water and baking soda mouthwash rinses 4 times a day to reduce mucositis.  MiraLAX and senna as for constipation Bactrim for prophylactic PJP.    #5 Pancytopenia related to chemotherapy -- thrombocytopenia and neutropenia resolved. Still has normocytic anemia Cytopenias accentuated by his HIV/AIDS and related medications    PLAN: -Discussed lab results from today, 03/03/2022, with the patient.  -CBC and CMP stable -transfuse PRBC prn for hgb<7.5 No notable new toxicites from chemotherapy and will continue C6D2 -outpatient Rituxan and Udencya on 03/10/2023 --Recommended to stay well-hydrated. Drink at least 2L of water. -NACL/NAHCOS mouthwash QID -patient declined lovenox for VTE prophylaxis and was recommeded to ambulated and use SCD.  Marland KitchenThe total time spent in the appointment was 25 minutes* .  All of the patient's questions were answered with apparent satisfaction. The patient knows to call the clinic with any problems, questions or concerns.   Sullivan Lone MD MS AAHIVMS [facility] Hematology/Oncology Physician [facility]  .*Total Encounter Time as defined by the Centers for Medicare and Medicaid Services includes, in addition to the face-to-face time of a patient visit (documented in the note above) non-face-to-face time: obtaining and reviewing outside history, ordering and reviewing medications, tests or procedures, care coordination (communications with other health care professionals or caregivers) and documentation in the medical record.

## 2023-03-05 NOTE — TOC Progression Note (Signed)
Transition of Care [facility]) - Progression Note    Patient Details  Name: BEJAMIN GREENSLADE MRN: [id number] Date of Birth: Feb 15, 1981  Transition of Care [facility]) CM/SW [place], RN Phone Number:[phone]  03/05/2023, 4:11 PM  Clinical Narrative:     Transition of Care [facility]) Screening Note   Patient Details  Name: WASIM GOODNOW Date of Birth: [birth year]   Transition of Care [facility]) CM/SW Contact:    Angelita Ingles, RN Phone Number: 03/05/2023, 4:11 PM    Transition of Care Department [facility]) has reviewed patient and no TOC needs have been identified at this time. We will continue to monitor patient advancement through interdisciplinary progression rounds. If new patient transition needs arise, please place a TOC consult.          Expected Discharge Plan and Services                                               Social Determinants of Health (SDOH) Interventions SDOH Screenings   Food Insecurity: No Food Insecurity (03/03/2023)  Housing: Low Risk  (03/03/2023)  Transportation Needs: No Transportation Needs (03/03/2023)  Utilities: Not At Risk (03/03/2023)  Alcohol Screen: Low Risk  (10/24/2022)  Depression (PHQ2-9): Low Risk  (01/15/2023)  Financial Resource Strain: Low Risk  (10/24/2022)  Physical Activity: Sufficiently Active (10/24/2022)  Recent Concern: Physical Activity - Insufficiently Active (10/24/2022)  Social Connections: Unknown (10/24/2022)  Stress: No Stress Concern Present (10/24/2022)  Tobacco Use: Low Risk  (03/03/2023)    Readmission Risk Interventions    02/13/2023   12:40 PM 02/02/2023   10:53 AM 01/20/2023    2:35 PM  Readmission Risk Prevention Plan  Transportation Screening Complete Complete Complete  PCP or Specialist Appt within 3-5 Days Complete  Complete  HRI or [place] Complete  Not Complete  HRI or Home Care Consult comments   n/a  Social Work Consult for [place] Planning/Counseling Complete  Complete   Palliative Care Screening Complete  Not Applicable  Medication Review [employer]) Complete Complete Referral to Pharmacy  PCP or Specialist appointment within 3-5 days of discharge  Complete   HRI or [place]  Complete   SW Recovery Care/Counseling Consult  Complete   [place]  Not Applicable

## 2023-03-06 ENCOUNTER — Encounter: Payer: Self-pay | Admitting: Hematology

## 2023-03-06 DIAGNOSIS — C8378 Burkitt lymphoma, lymph nodes of multiple sites: Secondary | ICD-10-CM | POA: Diagnosis not present

## 2023-03-06 DIAGNOSIS — Z5111 Encounter for antineoplastic chemotherapy: Secondary | ICD-10-CM | POA: Diagnosis not present

## 2023-03-06 LAB — COMPREHENSIVE METABOLIC PANEL
ALT: 16 U/L (ref 0–44)
AST: 16 U/L (ref 15–41)
Albumin: 3.4 g/dL — ABNORMAL LOW (ref 3.5–5.0)
Alkaline Phosphatase: 51 U/L (ref 38–126)
Anion gap: 8 (ref 5–15)
BUN: 13 mg/dL (ref 6–20)
CO2: 26 mmol/L (ref 22–32)
Calcium: 8.8 mg/dL — ABNORMAL LOW (ref 8.9–10.3)
Chloride: 102 mmol/L (ref 98–111)
Creatinine, Ser: 0.81 mg/dL (ref 0.61–1.24)
GFR, Estimated: >60 mL/min (ref >60)
Glucose, Bld: 130 mg/dL — ABNORMAL HIGH (ref 70–99)
Potassium: 4.2 mmol/L (ref 3.5–5.1)
Sodium: 136 mmol/L (ref 135–145)
Total Bilirubin: 1.2 mg/dL (ref 0.3–1.2)
Total Protein: 6.4 g/dL — ABNORMAL LOW (ref 6.5–8.1)

## 2023-03-06 LAB — CBC WITH DIFFERENTIAL/PLATELET
Abs Immature Granulocytes: 0.02 10*3/uL (ref 0.00–0.07)
Basophils Absolute: 0 10*3/uL (ref 0.0–0.1)
Basophils Relative: 0 %
Eosinophils Absolute: 0 10*3/uL (ref 0.0–0.5)
Eosinophils Relative: 0 %
HCT: 25.9 % — ABNORMAL LOW (ref 39.0–52.0)
Hemoglobin: 8.1 g/dL — ABNORMAL LOW (ref 13.0–17.0)
Immature Granulocytes: 1 %
Lymphocytes Relative: 14 %
Lymphs Abs: 0.4 10*3/uL — ABNORMAL LOW (ref 0.7–4.0)
MCH: 28.1 pg (ref 26.0–34.0)
MCHC: 31.3 g/dL (ref 30.0–36.0)
MCV: 89.9 fL (ref 80.0–100.0)
Monocytes Absolute: 0.2 10*3/uL (ref 0.1–1.0)
Monocytes Relative: 7 %
Neutro Abs: 2.1 10*3/uL (ref 1.7–7.7)
Neutrophils Relative %: 78 %
Platelets: 271 10*3/uL (ref 150–400)
RBC: 2.88 MIL/uL — ABNORMAL LOW (ref 4.22–5.81)
RDW: 17.1 % — ABNORMAL HIGH (ref 11.5–15.5)
WBC: 2.7 10*3/uL — ABNORMAL LOW (ref 4.0–10.5)
nRBC: 0 % (ref 0.0–0.2)

## 2023-03-06 MED ORDER — SODIUM CHLORIDE 0.9 % IV SOLN
500.0000 mg/m2 | Freq: Once | INTRAVENOUS | Status: AC
  Administered 2023-03-07: 1280 mg via INTRAVENOUS
  Filled 2023-03-06: qty 64

## 2023-03-06 MED ORDER — SODIUM CHLORIDE 0.9 % IV SOLN
Freq: Once | INTRAVENOUS | Status: AC
  Administered 2023-03-06: 18 mg via INTRAVENOUS
  Filled 2023-03-06: qty 4

## 2023-03-06 MED ORDER — VINCRISTINE SULFATE CHEMO INJECTION 1 MG/ML
Freq: Once | INTRAVENOUS | Status: AC
  Filled 2023-03-06: qty 13

## 2023-03-06 MED ORDER — SODIUM CHLORIDE 0.9 % IV SOLN
Freq: Once | INTRAVENOUS | Status: AC
  Administered 2023-03-07: 16 mg via INTRAVENOUS
  Filled 2023-03-06: qty 8

## 2023-03-06 NOTE — Progress Notes (Signed)
HEMATOLOGY/ONCOLOGY INPATIENT PROGRESS NOTE  Date of Service: 03/06/2023  Inpatient Attending: .Brunetta Genera, MD   SUBJECTIVE  Patient was last seen by me on 03/05/2023 for follow-up for cycle 6-day 4 of EPOCH-R chemotherapy and complained of constipation, but was doing well overall with no other overt symptoms.   Today, he reports that his constipation has been well managed with magnesium citrate, but not Senna. He does take nausea medication to avoid nausea, and he denies any uncontrollable nausea, mouth sores, or abdominal pain. Patient does regularly use NACL/NAHCOS mouthwash QID.  OBJECTIVE:  NAD  PHYSICAL EXAMINATION: . Vitals:   03/05/23 0435 03/05/23 1342 03/05/23 1944 03/06/23 0532  BP: 126/68 115/72 (!) 115/59 119/73  Pulse: 66 73 64 60  Resp: 18 20 18 18   Temp: 98.5 F (36.9 C) 98 F (36.7 C) 97.9 F (36.6 C) 97.9 F (36.6 C)  TempSrc: Oral Oral Oral Oral  SpO2: 100% 100% 100% 100%  Weight:      Height:       Filed Weights   03/03/23 1359  Weight: 121.4 kg   .Body mass index is 35.31 kg/m.  EYES: normal, conjunctiva are pink and non-injected, sclera clear OROPHARYNX:no exudate, no erythema and lips, buccal mucosa, and tongue normal  NECK: supple, no JVD, thyroid normal size, non-tender, without nodularity LYMPH:  no palpable lymphadenopathy in the cervical, axillary or inguinal LUNGS: clear to auscultation with normal respiratory effort HEART: regular rate & rhythm,  no murmurs and no lower extremity edema ABDOMEN: abdomen soft, non-tender, normoactive bowel sounds  Musculoskeletal: no cyanosis of digits and no clubbing  PSYCH: alert & oriented x 3 with fluent speech NEURO: no focal motor/sensory deficits  MEDICAL HISTORY:  Past Medical History:  Diagnosis Date   Anal fissure    HIV infection ([place])    Internal hemorrhoids    Obesity    Rectal ulcer     SURGICAL HISTORY: Past Surgical History:  Procedure Laterality Date   IR IMAGING  GUIDED PORT INSERTION  11/18/2022    SOCIAL HISTORY: Social History   Socioeconomic History   Marital status: Significant Other    Spouse name: Not on file   Number of children: Not on file   Years of education: Not on file   Highest education level: Not on file  Occupational History   Not on file  Tobacco Use   Smoking status: Never   Smokeless tobacco: Never  Vaping Use   Vaping Use: Never used  Substance and Sexual Activity   Alcohol use: No   Drug use: No   Sexual activity: Yes    Comment: declined condoms  Other Topics Concern   Not on file  Social History Narrative   Not on file   Social Determinants of Health   Financial Resource Strain: Low Risk  (10/24/2022)   Overall Financial Resource Strain (CARDIA)    Difficulty of Paying Living Expenses: Not hard at all  Food Insecurity: No Food Insecurity (03/03/2023)   Hunger Vital Sign    Worried About Running Out of Food in the Last Year: Never true    Ran Out of Food in the Last Year: Never true  Transportation Needs: No Transportation Needs (03/03/2023)   PRAPARE - Hydrologist (Medical): No    Lack of Transportation (Non-Medical): No  Physical Activity: Sufficiently Active (10/24/2022)   Exercise Vital Sign    Days of Exercise per Week: 3 days    Minutes of Exercise  per Session: 60 min  Recent Concern: Physical Activity - Insufficiently Active (10/24/2022)   Exercise Vital Sign    Days of Exercise per Week: 3 days    Minutes of Exercise per Session: 20 min  Stress: No Stress Concern Present (10/24/2022)   [place]    Feeling of Stress : Not at all  Social Connections: Unknown (10/24/2022)   Social Connection and Isolation Panel [NHANES]    Frequency of Communication with Friends and Family: Twice a week    Frequency of Social Gatherings with Friends and Family: Twice a week    Attends Religious Services: 1 to 4 times  per year    Active Member of [employer] or Organizations: No    Attends Archivist Meetings: 1 to 4 times per year    Marital Status: Patient declined  Intimate Partner Violence: Not At Risk (03/03/2023)   Humiliation, Afraid, Rape, and Kick questionnaire    Fear of Current or Ex-Partner: No    Emotionally Abused: No    Physically Abused: No    Sexually Abused: No    FAMILY HISTORY: Family History  Problem Relation Age of Onset   Hypertension Father    Throat cancer Father    Heart attack Brother    Colon cancer Neg Hx    Esophageal cancer Neg Hx    Stomach cancer Neg Hx     ALLERGIES:  has No Known Allergies.  MEDICATIONS:  Scheduled Meds:  B-complex with vitamin C  1 tablet Oral Daily   bictegravir-emtricitabine-tenofovir AF  1 tablet Oral Daily   Chlorhexidine Gluconate Cloth  6 each Topical Daily   [START ON 03/07/2023] cyclophosphamide  500 mg/m2 (Treatment Plan Recorded) Intravenous Once   DOXOrubicin (ADRIAMYCIN) 26 mg, etoposide (VEPESID) 102 mg, vinCRIStine (ONCOVIN) 1 mg in sodium chloride 0.9 % 1,000 mL chemo infusion   Intravenous Once   DOXOrubicin (ADRIAMYCIN) 26 mg, etoposide (VEPESID) 102 mg, vinCRIStine (ONCOVIN) 1 mg in sodium chloride 0.9 % 1,000 mL chemo infusion   Intravenous Once   senna  2 tablet Oral BID   sodium bicarbonate/sodium chloride   Mouth Rinse QID   sulfamethoxazole-trimethoprim  1 tablet Oral Daily   Continuous Infusions:  sodium chloride 20 mL/hr at 03/04/23 0203   [START ON 03/07/2023] ondansetron (ZOFRAN) 16 mg, dexamethasone (DECADRON) 20 mg in sodium chloride 0.9 % 50 mL IVPB     ondansetron (ZOFRAN) 8 mg, dexamethasone (DECADRON) 10 mg in sodium chloride 0.9 % 50 mL IVPB     PRN Meds:.acetaminophen, alum & mag hydroxide-simeth, Cold Pack, Hot Pack, hydrocortisone, senna-docusate  REVIEW OF SYSTEMS:    10 Point review of Systems was done is negative except as noted above.   LABORATORY DATA:  I have reviewed the data as  listed .    Latest Ref Rng & Units 03/06/2023    4:50 AM 03/05/2023    5:00 AM 03/04/2023    4:40 AM  CBC  WBC 4.0 - 10.5 K/uL 2.7  3.2  3.6   Hemoglobin 13.0 - 17.0 g/dL 8.1  7.9  8.4   Hematocrit 39.0 - 52.0 % 25.9  26.1  27.5   Platelets 150 - 400 K/uL 271  289  262    .    Latest Ref Rng & Units 03/06/2023    4:50 AM 03/05/2023    5:00 AM 03/04/2023    4:40 AM  CMP  Glucose 70 - 99 mg/dL 130  131  123   BUN 6 - 20 mg/dL 13  10  9    Creatinine 0.61 - 1.24 mg/dL 0.81  0.82  0.84   Sodium 135 - 145 mmol/L 136  136  137   Potassium 3.5 - 5.1 mmol/L 4.2  4.3  4.6   Chloride 98 - 111 mmol/L 102  105  103   CO2 22 - 32 mmol/L 26  22  24    Calcium 8.9 - 10.3 mg/dL 8.8  8.6  9.1   Total Protein 6.5 - 8.1 g/dL 6.4  6.5  6.7   Total Bilirubin 0.3 - 1.2 mg/dL 1.2  1.0  0.8   Alkaline Phos 38 - 126 U/L 51  52  62   AST 15 - 41 U/L 16  17  18    ALT 0 - 44 U/L 16  15  16      RADIOGRAPHIC STUDIES: I have personally reviewed the radiological images as listed and agreed with the findings in the report. CT Renal Stone Study  Result Date: 02/21/2023 CLINICAL DATA:  Abdominal pain, back pain EXAM: CT ABDOMEN AND PELVIS WITHOUT CONTRAST TECHNIQUE: Multidetector CT imaging of the abdomen and pelvis was performed following the standard protocol without IV contrast. RADIATION DOSE REDUCTION: This exam was performed according to the departmental dose-optimization program which includes automated exposure control, adjustment of the mA and/or kV according to patient size and/or use of iterative reconstruction technique. COMPARISON:  11/10/2022 FINDINGS: Lower chest: There is no focal consolidation in the lower lung fields. Coronary artery calcifications are seen. Hepatobiliary: No focal abnormalities are seen in liver. There is no dilation of bile ducts. Gallbladder is unremarkable. Pancreas: No focal abnormalities are seen. Spleen: Unremarkable. Adrenals/Urinary Tract: Adrenals are unremarkable. There is no  hydronephrosis. There are no renal or ureteral stones. There is increased density in the lumen of urinary bladder, possibly related to previously administered contrast. Stomach/Bowel: Stomach is not distended. Small bowel loops are not dilated. Appendix is not dilated. There is no significant wall thickening in colon. There is no pericolic stranding. Vascular/Lymphatic: There are scattered subcentimeter nodes in mesentery and retroperitoneum which appear less prominent. No new significant lymphadenopathy is seen. Vascular structures are unremarkable. Reproductive: Unremarkable. Other: There is no ascites or pneumoperitoneum. Small umbilical hernia containing fat is seen. Bilateral inguinal hernias containing fat are noted, larger on the left side. Musculoskeletal: No acute findings are seen. IMPRESSION: There is no evidence of intestinal obstruction or pneumoperitoneum. There is no hydronephrosis. Appendix is not dilated. Coronary artery calcifications are seen. Electronically Signed   By: Elmer Picker M.D.   On: 02/21/2023 15:50   MR Lumbar Spine W Wo Contrast  Result Date: 02/21/2023 CLINICAL DATA:  Low back pain, weakness in both legs. History of Burkitt's lymphoma and HIV. EXAM: MRI LUMBAR SPINE WITHOUT AND WITH CONTRAST TECHNIQUE: Multiplanar and multiecho pulse sequences of the lumbar spine were obtained without and with intravenous contrast. CONTRAST:  59mL GADAVIST GADOBUTROL 1 MMOL/ML IV SOLN COMPARISON:  PET-CT 01/17/2023 FINDINGS: Segmentation: Standard; the lowest formed disc space is designated L5-S1. Alignment:  Normal. Vertebrae: Vertebral body heights are preserved. Background marrow signal is normal. There is no suspicious marrow signal abnormality, marrow edema, or marrow enhancement. Conus medullaris and cauda equina: Conus extends to the L1 level. Conus and cauda equina appear normal. There is no abnormal enhancement of the cauda equina nerve roots. Paraspinal and other soft tissues:  Unremarkable. Disc levels: There is disc desiccation without significant loss of height at L5-S1. The  other disc spaces are preserved T12-L1: Unremarkable L1-L2: Unremarkable. L2-L3: Unremarkable. L3-L4: Unremarkable. L4-L5: There is a mild disc bulge and minimal facet arthropathy resulting in mild right worse than left neural foraminal stenosis without significant spinal canal stenosis L5-S1: There is a disc bulge with a central annular fissure, mild degenerative endplate spurring, and mild facet arthropathy resulting in mild to moderate right and mild left neural foraminal stenosis without significant spinal canal stenosis. IMPRESSION: 1. No suspicious marrow signal abnormality or abnormal enhancement. 2. Mild degenerative change at L4-L5 and L5-S1 with mild-to-moderate right and mild left neural foraminal stenosis at L5-S1. Electronically Signed   By: Valetta Mole M.D.   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 Concerning for at least stage II possibly stage III disease. Some borderline lymphadenopathy in the abdomen could also be from his recently diagnosed HIV/AIDS. No clinical symptoms suggestive of CNS involvement at this time. Brain MRI negative.  No constitutional symptoms. No significant cytopenias to suggest bone marrow involvement. Discussed with patient and he is not interested in fertility preservation or sperm banking. First presented in August at [facility] and was treated empirically with steroids and antibiotics with improvement in swelling prior to additional progression.     #2 HIV/AIDS recently diagnosed 6 months ago. Following Dr. Elna Breslow from [facility] and currently taking  Biktarvy.   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  Patient reports this was completely treated.   #4 Prophylaxis: Continue salt water and baking soda mouthwash rinses 4 times a day to reduce mucositis.  MiraLAX and senna as for constipation Bactrim for prophylactic PJP.    #5 Pancytopenia related to chemotherapy -- thrombocytopenia and neutropenia resolved. Still has normocytic anemia Cytopenias accentuated by his HIV/AIDS and related medications   PLAN: -CBC from 03/06/2023 showed WBC of 2.7K, hemoglobin of 8.1, and platelets of 271K. -transfuse PRBC prn for hgb<7.5 -No new significant toxicities of chemotherapy -outpatient Rituxan and Udencya on 03/10/2023 -NACL/NAHCOS mouthwash QID -No subcu Lovenox as per patient's preference.  He is ambulating every day to reduce risk of VTE. -answered all of patient's questions regarding Lovenox injections  The total time spent in the appointment was 25 minutes* .  All of the patient's questions were answered with apparent satisfaction. The patient knows to call the clinic with any problems, questions or concerns.   Sullivan Lone MD MS AAHIVMS [facility] Hematology/Oncology Physician [facility]  .*Total Encounter Time as defined by the Centers for Medicare and Medicaid Services includes, in addition to the face-to-face time of a patient visit (documented in the note above) non-face-to-face time: obtaining and reviewing outside history, ordering and reviewing medications, tests or procedures, care coordination (communications with other health care professionals or caregivers) and documentation in the medical record.    I,Mitra Faeizi,acting as a Education administrator for No name on file.,have documented all relevant documentation on the behalf of No name on file,as directed by  No name on file while in the presence of No name on file.  .I have reviewed the above documentation for accuracy and completeness, and I agree with the  above. Sullivan Lone MD

## 2023-03-06 NOTE — Progress Notes (Signed)
Mobility Specialist - Progress Note   03/06/23 1300  Mobility  Activity Ambulated with assistance in hallway  Level of Assistance Independent after set-up  Assistive Device Other (Comment) (IV Pole)  Distance Ambulated (ft) 500 ft  Activity Response Tolerated well  Mobility Referral Yes  $Mobility charge 1 Mobility   Pt received in bed and agreeable to mobility. No complaints during session. Pt to bed after session with all needs met.    [facility]

## 2023-03-07 ENCOUNTER — Other Ambulatory Visit: Payer: Self-pay | Admitting: Hematology

## 2023-03-07 ENCOUNTER — Encounter: Payer: Self-pay | Admitting: Hematology

## 2023-03-07 ENCOUNTER — Other Ambulatory Visit (HOSPITAL_COMMUNITY): Payer: Self-pay

## 2023-03-07 ENCOUNTER — Other Ambulatory Visit: Payer: Self-pay | Admitting: Hematology and Oncology

## 2023-03-07 DIAGNOSIS — Z5111 Encounter for antineoplastic chemotherapy: Secondary | ICD-10-CM | POA: Diagnosis not present

## 2023-03-07 DIAGNOSIS — D61810 Antineoplastic chemotherapy induced pancytopenia: Secondary | ICD-10-CM | POA: Diagnosis not present

## 2023-03-07 DIAGNOSIS — T451X5A Adverse effect of antineoplastic and immunosuppressive drugs, initial encounter: Secondary | ICD-10-CM | POA: Diagnosis not present

## 2023-03-07 DIAGNOSIS — C8378 Burkitt lymphoma, lymph nodes of multiple sites: Secondary | ICD-10-CM | POA: Diagnosis not present

## 2023-03-07 DIAGNOSIS — B20 Human immunodeficiency virus [HIV] disease: Secondary | ICD-10-CM

## 2023-03-07 LAB — COMPREHENSIVE METABOLIC PANEL
ALT: 17 U/L (ref 0–44)
AST: 14 U/L — ABNORMAL LOW (ref 15–41)
Albumin: 3.4 g/dL — ABNORMAL LOW (ref 3.5–5.0)
Alkaline Phosphatase: 52 U/L (ref 38–126)
Anion gap: 8 (ref 5–15)
BUN: 14 mg/dL (ref 6–20)
CO2: 24 mmol/L (ref 22–32)
Calcium: 8.7 mg/dL — ABNORMAL LOW (ref 8.9–10.3)
Chloride: 101 mmol/L (ref 98–111)
Creatinine, Ser: 0.81 mg/dL (ref 0.61–1.24)
GFR, Estimated: >60 mL/min (ref >60)
Glucose, Bld: 115 mg/dL — ABNORMAL HIGH (ref 70–99)
Potassium: 3.9 mmol/L (ref 3.5–5.1)
Sodium: 133 mmol/L — ABNORMAL LOW (ref 135–145)
Total Bilirubin: 1.4 mg/dL — ABNORMAL HIGH (ref 0.3–1.2)
Total Protein: 6.3 g/dL — ABNORMAL LOW (ref 6.5–8.1)

## 2023-03-07 LAB — CBC WITH DIFFERENTIAL/PLATELET
Abs Immature Granulocytes: 0.01 10*3/uL (ref 0.00–0.07)
Basophils Absolute: 0 10*3/uL (ref 0.0–0.1)
Basophils Relative: 0 %
Eosinophils Absolute: 0 10*3/uL (ref 0.0–0.5)
Eosinophils Relative: 0 %
HCT: 25.2 % — ABNORMAL LOW (ref 39.0–52.0)
Hemoglobin: 8 g/dL — ABNORMAL LOW (ref 13.0–17.0)
Immature Granulocytes: 1 %
Lymphocytes Relative: 21 %
Lymphs Abs: 0.3 10*3/uL — ABNORMAL LOW (ref 0.7–4.0)
MCH: 27.9 pg (ref 26.0–34.0)
MCHC: 31.7 g/dL (ref 30.0–36.0)
MCV: 87.8 fL (ref 80.0–100.0)
Monocytes Absolute: 0.1 10*3/uL (ref 0.1–1.0)
Monocytes Relative: 5 %
Neutro Abs: 1.1 10*3/uL — ABNORMAL LOW (ref 1.7–7.7)
Neutrophils Relative %: 73 %
Platelets: 278 10*3/uL (ref 150–400)
RBC: 2.87 MIL/uL — ABNORMAL LOW (ref 4.22–5.81)
RDW: 16.4 % — ABNORMAL HIGH (ref 11.5–15.5)
WBC: 1.5 10*3/uL — ABNORMAL LOW (ref 4.0–10.5)
nRBC: 0 % (ref 0.0–0.2)

## 2023-03-07 MED ORDER — SULFAMETHOXAZOLE-TRIMETHOPRIM 800-160 MG PO TABS
1.0000 | ORAL_TABLET | Freq: Every day | ORAL | 1 refills | Status: DC
  Filled 2023-03-07: qty 30, 30d supply, fill #0
  Filled 2023-05-05 (×3): qty 30, 30d supply, fill #1

## 2023-03-07 MED ORDER — HEPARIN SOD (PORK) LOCK FLUSH 100 UNIT/ML IV SOLN
500.0000 [IU] | Freq: Once | INTRAVENOUS | Status: AC
  Administered 2023-03-07: 500 [IU] via INTRAVENOUS
  Filled 2023-03-07: qty 5

## 2023-03-07 MED ORDER — LEVOFLOXACIN 500 MG PO TABS
500.0000 mg | ORAL_TABLET | Freq: Every day | ORAL | 0 refills | Status: DC
  Filled 2023-03-07: qty 10, 10d supply, fill #0

## 2023-03-07 MED ORDER — DEXAMETHASONE 4 MG PO TABS
ORAL_TABLET | ORAL | 0 refills | Status: DC
  Filled 2023-03-07: qty 30, 10d supply, fill #0

## 2023-03-07 MED ORDER — B COMPLEX-C PO TABS
1.0000 | ORAL_TABLET | Freq: Every day | ORAL | 11 refills | Status: AC
  Filled 2023-03-07: qty 30, 30d supply, fill #0

## 2023-03-07 MED ORDER — ACYCLOVIR 400 MG PO TABS
400.0000 mg | ORAL_TABLET | Freq: Two times a day (BID) | ORAL | 0 refills | Status: DC
  Filled 2023-03-07: qty 30, 15d supply, fill #0

## 2023-03-07 NOTE — Progress Notes (Signed)
Cycle 6  Cyclophosphamide 1,280 mg, IV via port administered over 30 minutes.  Blood return noted.   Pt alert and oriented and feels great.    De-accessed port per policy at discharge.

## 2023-03-07 NOTE — Progress Notes (Signed)
Mobility Specialist - Progress Note   03/07/23 1042  Mobility  Activity Ambulated with assistance in hallway  Level of Assistance Independent after set-up  Assistive Device None  Distance Ambulated (ft) 500 ft  Activity Response Tolerated well  Mobility Referral Yes  $Mobility charge 1 Mobility   Pt received in bed and agreed to mobility, had no issues throughout session. Pt returned to bed with all needs met.   Roderick Pee Mobility Specialist

## 2023-03-07 NOTE — Discharge Summary (Signed)
. [place]  [phone] [phone]    Physician Discharge Summary     Patient ID: Logan Ward MRN: [id number] DOB/AGE: December 06, 1981 42 y.o.  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   B-complex with vitamin C tablet Take 1 tablet by mouth daily. Start taking on: March 08, 2023   Biktarvy 50-200-25 MG Tabs tablet Generic drug: bictegravir-emtricitabine-tenofovir AF Take 1 tablet by mouth daily.   dexamethasone 4 MG tablet Commonly known as: DECADRON Take Dexamethasone 4mg  daily for 2 day after treatment and then 2mg  po daily for 10 days. What changed:  how much to take how to take this when to take this additional instructions   levofloxacin 500 MG tablet Commonly known as: Levaquin Take 1 tablet (500 mg total) by mouth daily. Starting taking day after Rituxan infusion   LORazepam 0.5 MG tablet Commonly known as: ATIVAN Take 0.5 mg by mouth every 8 (eight) hours as needed for anxiety.   senna-docusate 8.6-50 MG tablet Commonly known as: Senokot-S Take 2 tablets by mouth at bedtime as needed for mild constipation.   sulfamethoxazole-trimethoprim 800-160 MG tablet Commonly known as: BACTRIM DS Take 1 tablet by mouth daily.         Disposition and Follow-up:   Significant Diagnostic Studies:  CT Renal Stone Study  Result Date: 02/21/2023 CLINICAL DATA:  Abdominal pain, back pain EXAM: CT ABDOMEN AND  PELVIS WITHOUT CONTRAST TECHNIQUE: Multidetector CT imaging of the abdomen and pelvis was performed following the standard protocol without IV contrast. RADIATION DOSE REDUCTION: This exam was performed according to the departmental dose-optimization program which includes automated exposure control, adjustment of the mA and/or kV according to patient size and/or use of iterative reconstruction technique. COMPARISON:  11/10/2022 FINDINGS: Lower chest: There is no focal consolidation in the lower lung fields. Coronary artery calcifications are seen. Hepatobiliary: No focal abnormalities are seen in liver. There is no dilation of bile ducts. Gallbladder is unremarkable. Pancreas: No focal abnormalities are seen. Spleen: Unremarkable. Adrenals/Urinary Tract: Adrenals are unremarkable. There is no hydronephrosis. There are no renal or ureteral stones. There is increased density in the lumen of urinary bladder, possibly related to previously administered contrast. Stomach/Bowel: Stomach is not distended. Small bowel loops are not dilated. Appendix is not dilated. There is no significant wall thickening in colon. There is no pericolic stranding. Vascular/Lymphatic: There are scattered subcentimeter nodes in mesentery and retroperitoneum which appear less prominent. No new significant lymphadenopathy is seen. Vascular structures are unremarkable. Reproductive: Unremarkable. Other: There is no ascites or pneumoperitoneum. Small umbilical hernia containing fat is seen. Bilateral inguinal hernias containing fat are noted, larger on the left side. Musculoskeletal: No acute findings are seen. IMPRESSION: There is no evidence of intestinal obstruction or pneumoperitoneum. There is no hydronephrosis. Appendix is not dilated. Coronary artery calcifications are seen. Electronically Signed   By: Prudy Feeler.D.  On: 02/21/2023 15:50   MR Lumbar Spine W Wo Contrast  Result Date: 02/21/2023 CLINICAL DATA:  Low back pain,  weakness in both legs. History of Burkitt's lymphoma and HIV. EXAM: MRI LUMBAR SPINE WITHOUT AND WITH CONTRAST TECHNIQUE: Multiplanar and multiecho pulse sequences of the lumbar spine were obtained without and with intravenous contrast. CONTRAST:  52mL GADAVIST GADOBUTROL 1 MMOL/ML IV SOLN COMPARISON:  PET-CT 01/17/2023 FINDINGS: Segmentation: Standard; the lowest formed disc space is designated L5-S1. Alignment:  Normal. Vertebrae: Vertebral body heights are preserved. Background marrow signal is normal. There is no suspicious marrow signal abnormality, marrow edema, or marrow enhancement. Conus medullaris and cauda equina: Conus extends to the L1 level. Conus and cauda equina appear normal. There is no abnormal enhancement of the cauda equina nerve roots. Paraspinal and other soft tissues: Unremarkable. Disc levels: There is disc desiccation without significant loss of height at L5-S1. The other disc spaces are preserved T12-L1: Unremarkable L1-L2: Unremarkable. L2-L3: Unremarkable. L3-L4: Unremarkable. L4-L5: There is a mild disc bulge and minimal facet arthropathy resulting in mild right worse than left neural foraminal stenosis without significant spinal canal stenosis L5-S1: There is a disc bulge with a central annular fissure, mild degenerative endplate spurring, and mild facet arthropathy resulting in mild to moderate right and mild left neural foraminal stenosis without significant spinal canal stenosis. IMPRESSION: 1. No suspicious marrow signal abnormality or abnormal enhancement. 2. Mild degenerative change at L4-L5 and L5-S1 with mild-to-moderate right and mild left neural foraminal stenosis at L5-S1. Electronically Signed   By: Valetta Mole M.D.   On: 02/21/2023 11:34    Discharge Laboratory Values: .    Latest Ref Rng & Units 03/07/2023    5:00 AM 03/06/2023    4:50 AM 03/05/2023    5:00 AM  CBC  WBC 4.0 - 10.5 K/uL 1.5  2.7  3.2   Hemoglobin 13.0 - 17.0 g/dL 8.0  8.1  7.9   Hematocrit 39.0 -  52.0 % 25.2  25.9  26.1   Platelets 150 - 400 K/uL 278  271  289    .    Latest Ref Rng & Units 03/07/2023    5:00 AM 03/06/2023    4:50 AM 03/05/2023    5:00 AM  CMP  Glucose 70 - 99 mg/dL 115  130  131   BUN 6 - 20 mg/dL 14  13  10    Creatinine 0.61 - 1.24 mg/dL 0.81  0.81  0.82   Sodium 135 - 145 mmol/L 133  136  136   Potassium 3.5 - 5.1 mmol/L 3.9  4.2  4.3   Chloride 98 - 111 mmol/L 101  102  105   CO2 22 - 32 mmol/L 24  26  22    Calcium 8.9 - 10.3 mg/dL 8.7  8.8  8.6   Total Protein 6.5 - 8.1 g/dL 6.3  6.4  6.5   Total Bilirubin 0.3 - 1.2 mg/dL 1.4  1.2  1.0   Alkaline Phos 38 - 126 U/L 52  51  52   AST 15 - 41 U/L 14  16  17    ALT 0 - 44 U/L 17  16  15       Brief H and P: For complete details please refer to admission H and P, but in brief, Logan Ward is a wonderful 42 y.o. male with a history of HIV/AIDS(CD count of 190 on 12/19/2022) on Biktarvy who is being admitted for cycle 6 of inpatient dose adjusted  EPOCH-R chemotherapy.   Patient tolerated cycle 5 of EPOCH-R chemoimmunotherapy without any immediate side effects. However he was recently admitted with neutropenic fever and back pain thought to be related to his G-CSF. No overt source of infection noted.  Back pain is resolved.  Patient is here to complete his last cycle of chemotherapy and is excited for this.  His mom is at bedside with him.  Issues during hospitalization  #1  Stage II recently diagnosed Burkitt's lymphoma Presenting with rapidly growing right neck mass extending into the chest, bilateral x-ray lymphadenopathy and also concern for possible involvement of retroperitoneal lymph nodes. Concerning for at least stage II possibly stage III disease. Some borderline lymphadenopathy in the abdomen could also be from his recently diagnosed HIV/AIDS. No clinical symptoms suggestive of CNS involvement at this time. Brain MRI negative.  No constitutional symptoms. No significant cytopenias to suggest bone  marrow involvement. Discussed with patient and he is not interested in fertility preservation or sperm banking. First presented in August at [facility] and was treated empirically with steroids and antibiotics with improvement in swelling prior to additional progression.     #2 HIV/AIDS recently diagnosed 6 months ago. Following Dr. Elna Breslow from [place] and currently taking Biktarvy.   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  Patient reports this was completely treated.   #4 Prophylaxis: Continue salt water and baking soda mouthwash rinses 4 times a day to reduce mucositis.  MiraLAX and senna as for constipation Bactrim for prophylactic PJP.    #5 Pancytopenia related to chemotherapy -- thrombocytopenia and neutropenia resolved. Still has normocytic anemia Cytopenias accentuated by his HIV/AIDS and related medications    PLAN: -labs from 03/07/2023 discussed in details with the patient. -transfuse PRBC prn for hgb<7.5 -No new significant toxicities from C6 of EPOCH chemotherapy -outpatient Rituxan and Udencya on 03/10/2023 -NACL/NAHCOS mouthwash QID -dexamehtasone 4mg  po daily x 2 days then 2mg  po daily for 10 days to suppress G-CSF related bone pain and fevers. -levofloxacin and Acyclovir for neutropenic fever prevention -has f/u with [facility] for lab monitoring on discharge -will get PET/CT in 3-4 weeks after C6 of treatment and will consider referral to Rad onc for consideration of ISRT.  Patient discharged in stable condition.  Physical Exam at Discharge: BP (!) 104/53 (BP Location: Left Arm)   Pulse (!) 57   Temp 97.9 F (36.6 C) (Oral)   Resp 18   Ht 6\' 1"  (1.854 m)   Wt 267 lb 10.2 oz (121.4 kg)   SpO2 100%   BMI 35.31 kg/m  . GENERAL:alert, in no acute distress and comfortable SKIN: no acute rashes, no significant lesions EYES: conjunctiva are pink and non-injected, sclera anicteric OROPHARYNX: MMM, no exudates, no oropharyngeal erythema or ulceration NECK:  supple, no JVD LYMPH:  no palpable lymphadenopathy in the cervical, axillary or inguinal regions LUNGS: clear to auscultation b/l with normal respiratory effort HEART: regular rate & rhythm ABDOMEN:  normoactive bowel sounds , non tender, not distended. 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  Signed: Dr. Sullivan Lone MD [place] [phone]  03/07/2023, 11:53 AM TT discharging patient>48mins

## 2023-03-10 ENCOUNTER — Other Ambulatory Visit: Payer: Self-pay

## 2023-03-10 ENCOUNTER — Inpatient Hospital Stay: Payer: 59

## 2023-03-10 VITALS — BP 118/62 | HR 58 | Temp 98.0°F | Resp 18

## 2023-03-10 DIAGNOSIS — C8378 Burkitt lymphoma, lymph nodes of multiple sites: Secondary | ICD-10-CM

## 2023-03-10 DIAGNOSIS — C8370 Burkitt lymphoma, unspecified site: Secondary | ICD-10-CM | POA: Diagnosis present

## 2023-03-10 DIAGNOSIS — Z5189 Encounter for other specified aftercare: Secondary | ICD-10-CM | POA: Diagnosis not present

## 2023-03-10 DIAGNOSIS — D696 Thrombocytopenia, unspecified: Secondary | ICD-10-CM | POA: Diagnosis not present

## 2023-03-10 DIAGNOSIS — E883 Tumor lysis syndrome: Secondary | ICD-10-CM | POA: Diagnosis not present

## 2023-03-10 DIAGNOSIS — Z5112 Encounter for antineoplastic immunotherapy: Secondary | ICD-10-CM | POA: Diagnosis present

## 2023-03-10 DIAGNOSIS — Z79624 Long term (current) use of inhibitors of nucleotide synthesis: Secondary | ICD-10-CM | POA: Diagnosis not present

## 2023-03-10 DIAGNOSIS — Z79899 Other long term (current) drug therapy: Secondary | ICD-10-CM | POA: Diagnosis not present

## 2023-03-10 DIAGNOSIS — G4700 Insomnia, unspecified: Secondary | ICD-10-CM | POA: Diagnosis not present

## 2023-03-10 DIAGNOSIS — Z7189 Other specified counseling: Secondary | ICD-10-CM

## 2023-03-10 DIAGNOSIS — B20 Human immunodeficiency virus [HIV] disease: Secondary | ICD-10-CM | POA: Diagnosis present

## 2023-03-10 LAB — CMP (CANCER CENTER ONLY)
ALT: 21 U/L (ref 0–44)
AST: 11 U/L — ABNORMAL LOW (ref 15–41)
Albumin: 3.8 g/dL (ref 3.5–5.0)
Alkaline Phosphatase: 55 U/L (ref 38–126)
Anion gap: 9 (ref 5–15)
BUN: 17 mg/dL (ref 6–20)
CO2: 25 mmol/L (ref 22–32)
Calcium: 8.8 mg/dL — ABNORMAL LOW (ref 8.9–10.3)
Chloride: 102 mmol/L (ref 98–111)
Creatinine: 0.86 mg/dL (ref 0.61–1.24)
GFR, Estimated: >60 mL/min (ref >60)
Glucose, Bld: 127 mg/dL — ABNORMAL HIGH (ref 70–99)
Potassium: 3.1 mmol/L — ABNORMAL LOW (ref 3.5–5.1)
Sodium: 136 mmol/L (ref 135–145)
Total Bilirubin: 1.3 mg/dL — ABNORMAL HIGH (ref 0.3–1.2)
Total Protein: 6.2 g/dL — ABNORMAL LOW (ref 6.5–8.1)

## 2023-03-10 LAB — CBC WITH DIFFERENTIAL (CANCER CENTER ONLY)
Abs Immature Granulocytes: 0.01 10*3/uL (ref 0.00–0.07)
Basophils Absolute: 0 10*3/uL (ref 0.0–0.1)
Basophils Relative: 0 %
Eosinophils Absolute: 0 10*3/uL (ref 0.0–0.5)
Eosinophils Relative: 0 %
HCT: 24.8 % — ABNORMAL LOW (ref 39.0–52.0)
Hemoglobin: 8.3 g/dL — ABNORMAL LOW (ref 13.0–17.0)
Immature Granulocytes: 1 %
Lymphocytes Relative: 25 %
Lymphs Abs: 0.3 10*3/uL — ABNORMAL LOW (ref 0.7–4.0)
MCH: 28.3 pg (ref 26.0–34.0)
MCHC: 33.5 g/dL (ref 30.0–36.0)
MCV: 84.6 fL (ref 80.0–100.0)
Monocytes Absolute: 0 10*3/uL — ABNORMAL LOW (ref 0.1–1.0)
Monocytes Relative: 1 %
Neutro Abs: 0.8 10*3/uL — ABNORMAL LOW (ref 1.7–7.7)
Neutrophils Relative %: 73 %
Platelet Count: 174 10*3/uL (ref 150–400)
RBC: 2.93 MIL/uL — ABNORMAL LOW (ref 4.22–5.81)
RDW: 16.4 % — ABNORMAL HIGH (ref 11.5–15.5)
WBC Count: 1 10*3/uL — ABNORMAL LOW (ref 4.0–10.5)
nRBC: 0 % (ref 0.0–0.2)

## 2023-03-10 MED ORDER — DIPHENHYDRAMINE HCL 25 MG PO CAPS
50.0000 mg | ORAL_CAPSULE | Freq: Once | ORAL | Status: AC
  Administered 2023-03-10: 50 mg via ORAL
  Filled 2023-03-10: qty 2

## 2023-03-10 MED ORDER — SODIUM CHLORIDE 0.9 % IV SOLN
375.0000 mg/m2 | Freq: Once | INTRAVENOUS | Status: AC
  Administered 2023-03-10: 1000 mg via INTRAVENOUS
  Filled 2023-03-10: qty 100

## 2023-03-10 MED ORDER — PEGFILGRASTIM-CBQV 6 MG/0.6ML ~~LOC~~ SOSY
6.0000 mg | PREFILLED_SYRINGE | Freq: Once | SUBCUTANEOUS | Status: DC
  Administered 2023-03-10: 6 mg via SUBCUTANEOUS
  Filled 2023-03-10: qty 0.6

## 2023-03-10 MED ORDER — FAMOTIDINE IN NACL 20-0.9 MG/50ML-% IV SOLN
20.0000 mg | Freq: Once | INTRAVENOUS | Status: AC
  Administered 2023-03-10: 20 mg via INTRAVENOUS
  Filled 2023-03-10: qty 50

## 2023-03-10 MED ORDER — METHYLPREDNISOLONE SODIUM SUCC 125 MG IJ SOLR
125.0000 mg | Freq: Every day | INTRAMUSCULAR | Status: DC
  Administered 2023-03-10: 125 mg via INTRAVENOUS
  Filled 2023-03-10: qty 2

## 2023-03-10 MED ORDER — ACETAMINOPHEN 325 MG PO TABS
650.0000 mg | ORAL_TABLET | Freq: Once | ORAL | Status: AC
  Administered 2023-03-10: 650 mg via ORAL
  Filled 2023-03-10: qty 2

## 2023-03-10 MED ORDER — SODIUM CHLORIDE 0.9 % IV SOLN: Freq: Once | INTRAVENOUS | Status: AC

## 2023-03-11 ENCOUNTER — Inpatient Hospital Stay: Payer: 59

## 2023-03-11 ENCOUNTER — Inpatient Hospital Stay (HOSPITAL_BASED_OUTPATIENT_CLINIC_OR_DEPARTMENT_OTHER): Payer: 59 | Admitting: Physician Assistant

## 2023-03-11 ENCOUNTER — Encounter: Payer: Self-pay | Admitting: Hematology

## 2023-03-11 VITALS — BP 133/76 | HR 75 | Temp 97.5°F | Resp 14 | Wt 271.1 lb

## 2023-03-11 DIAGNOSIS — Z95828 Presence of other vascular implants and grafts: Secondary | ICD-10-CM

## 2023-03-11 DIAGNOSIS — Z7189 Other specified counseling: Secondary | ICD-10-CM | POA: Diagnosis not present

## 2023-03-11 DIAGNOSIS — C8378 Burkitt lymphoma, lymph nodes of multiple sites: Secondary | ICD-10-CM

## 2023-03-11 DIAGNOSIS — Z5112 Encounter for antineoplastic immunotherapy: Secondary | ICD-10-CM | POA: Diagnosis not present

## 2023-03-11 LAB — CMP (CANCER CENTER ONLY)
ALT: 27 U/L (ref 0–44)
AST: 12 U/L — ABNORMAL LOW (ref 15–41)
Albumin: 4 g/dL (ref 3.5–5.0)
Alkaline Phosphatase: 52 U/L (ref 38–126)
Anion gap: 8 (ref 5–15)
BUN: 14 mg/dL (ref 6–20)
CO2: 26 mmol/L (ref 22–32)
Calcium: 9 mg/dL (ref 8.9–10.3)
Chloride: 101 mmol/L (ref 98–111)
Creatinine: 0.72 mg/dL (ref 0.61–1.24)
GFR, Estimated: >60 mL/min (ref >60)
Glucose, Bld: 105 mg/dL — ABNORMAL HIGH (ref 70–99)
Potassium: 3.8 mmol/L (ref 3.5–5.1)
Sodium: 135 mmol/L (ref 135–145)
Total Bilirubin: 0.8 mg/dL (ref 0.3–1.2)
Total Protein: 6.4 g/dL — ABNORMAL LOW (ref 6.5–8.1)

## 2023-03-11 LAB — CBC WITH DIFFERENTIAL (CANCER CENTER ONLY)
Abs Immature Granulocytes: 0.16 10*3/uL — ABNORMAL HIGH (ref 0.00–0.07)
Basophils Absolute: 0 10*3/uL (ref 0.0–0.1)
Basophils Relative: 0 %
Eosinophils Absolute: 0 10*3/uL (ref 0.0–0.5)
Eosinophils Relative: 0 %
HCT: 23.8 % — ABNORMAL LOW (ref 39.0–52.0)
Hemoglobin: 7.8 g/dL — ABNORMAL LOW (ref 13.0–17.0)
Immature Granulocytes: 11 %
Lymphocytes Relative: 19 %
Lymphs Abs: 0.3 10*3/uL — ABNORMAL LOW (ref 0.7–4.0)
MCH: 28.1 pg (ref 26.0–34.0)
MCHC: 32.8 g/dL (ref 30.0–36.0)
MCV: 85.6 fL (ref 80.0–100.0)
Monocytes Absolute: 0 10*3/uL — ABNORMAL LOW (ref 0.1–1.0)
Monocytes Relative: 2 %
Neutro Abs: 1 10*3/uL — ABNORMAL LOW (ref 1.7–7.7)
Neutrophils Relative %: 68 %
Platelet Count: 129 10*3/uL — ABNORMAL LOW (ref 150–400)
RBC: 2.78 MIL/uL — ABNORMAL LOW (ref 4.22–5.81)
RDW: 16.2 % — ABNORMAL HIGH (ref 11.5–15.5)
WBC Count: 1.4 10*3/uL — ABNORMAL LOW (ref 4.0–10.5)
nRBC: 0 % (ref 0.0–0.2)

## 2023-03-11 MED ORDER — HEPARIN SOD (PORK) LOCK FLUSH 100 UNIT/ML IV SOLN
500.0000 [IU] | Freq: Once | INTRAVENOUS | Status: AC
  Administered 2023-03-11: 500 [IU]

## 2023-03-11 MED ORDER — SODIUM CHLORIDE 0.9% FLUSH
10.0000 mL | Freq: Once | INTRAVENOUS | Status: AC
  Administered 2023-03-11: 10 mL

## 2023-03-13 ENCOUNTER — Other Ambulatory Visit: Payer: Self-pay

## 2023-03-13 ENCOUNTER — Encounter: Payer: Self-pay | Admitting: Hematology

## 2023-03-13 ENCOUNTER — Telehealth: Payer: Self-pay | Admitting: Hematology

## 2023-03-13 DIAGNOSIS — C8378 Burkitt lymphoma, lymph nodes of multiple sites: Secondary | ICD-10-CM

## 2023-03-13 NOTE — Telephone Encounter (Signed)
Per 3/26 LOS reached out to patient to schedule; left voicemail,

## 2023-03-13 NOTE — Progress Notes (Signed)
[facility] OFFICE CLINIC NOTE  Date of service.03/13/23   DIAGNOSIS:  Burkitt's lymphoma in patient with HIV/AIDs diagnosed in November/December 2023  PRIOR THERAPY: None  CURRENT THERAPY: Status post 6 cycles of dose adjusted EPOCH-R chemotherapy.  First dose started on 11/18/2022. Completed on 03/10/2023.   INTERVAL HISTORY:  Logan Ward 42 y.o. male with Burkitt's lymphoma who is being followed-up for toxicity check after completion of cycle 6 of inpatient EPOCH-R. Logan Ward reports he is feeling better after hospital discharge. His energy and appetite are overall stable. He denies nausea, vomiting or abdominal pain. Bowel habits are unchanged. He denies fevers, chills, sweats, shortness of breath, chest pain or cough. He has no other complaints.    MEDICAL HISTORY: Past Medical History:  Diagnosis Date   Anal fissure    HIV infection ([place])    Internal hemorrhoids    Obesity    Rectal ulcer     ALLERGIES:  has No Known Allergies.  MEDICATIONS:  Current Outpatient Medications  Medication Sig Dispense Refill   acyclovir (ZOVIRAX) 400 MG tablet Take 1 tablet (400 mg total) by mouth 2 (two) times daily. 30 tablet 0   B Complex-C (B-COMPLEX WITH VITAMIN C) tablet Take 1 tablet by mouth daily. 30 tablet 11   bictegravir-emtricitabine-tenofovir AF (BIKTARVY) 50-200-25 MG TABS tablet Take 1 tablet by mouth daily. 30 tablet 5   dexamethasone (DECADRON) 4 MG tablet Take 1 tablet (4mg ) daily for 2 day after treatment and then 1/2 tablet (2mg ) by mouth daily for 10 days. 30 tablet 0   levofloxacin (LEVAQUIN) 500 MG tablet Take 1 tablet (500 mg total) by mouth daily. Starting taking day after Rituxan infusion 10 tablet 0   LORazepam (ATIVAN) 0.5 MG tablet Take 0.5 mg by mouth every 8 (eight) hours as needed for anxiety.     senna-docusate (SENOKOT-S) 8.6-50 MG tablet Take 2 tablets by mouth at bedtime as needed for mild constipation. 60 tablet 0    sulfamethoxazole-trimethoprim (BACTRIM DS) 800-160 MG tablet Take 1 tablet by mouth daily. 30 tablet 1   No current facility-administered medications for this visit.    SURGICAL HISTORY:  Past Surgical History:  Procedure Laterality Date   IR IMAGING GUIDED PORT INSERTION  11/18/2022    REVIEW OF SYSTEMS:    10 Point review of Systems was done is negative except as noted above.   PHYSICAL EXAMINATION: PHONE VISIT .BP 133/76 (BP Location: Left Arm, Patient Position: Sitting)   Pulse 75   Temp (!) 97.5 F (36.4 C) (Oral)   Resp 14   Wt 271 lb 1.6 oz (123 kg)   SpO2 100%   BMI 35.77 kg/m  . GENERAL:alert, in no acute distress and comfortable SKIN: no acute rashes, no significant lesions EYES: conjunctiva are pink and non-injected, sclera anicteric LYMPH:  no palpable lymphadenopathy in the cervical or supraclavicular regions LUNGS: clear to auscultation b/l with normal respiratory effort HEART: regular rate & rhythm Extremity: no pedal edema PSYCH: alert & oriented x 3 with fluent speech NEURO: no focal motor/sensory deficits  ECOG PERFORMANCE STATUS: 1   LABORATORY DATA:  Latest Reference Range & Units 03/11/23 12:42  Sodium 135 - 145 mmol/L 135  Potassium 3.5 - 5.1 mmol/L 3.8  Chloride 98 - 111 mmol/L 101  CO2 22 - 32 mmol/L 26  Glucose 70 - 99 mg/dL 105 (H)  BUN 6 - 20 mg/dL 14  Creatinine 0.61 - 1.24 mg/dL 0.72  Calcium 8.9 - 10.3 mg/dL 9.0  Anion gap 5 - 15  8  Alkaline Phosphatase 38 - 126 U/L 52  Albumin 3.5 - 5.0 g/dL 4.0  AST 15 - 41 U/L 12 (L)  ALT 0 - 44 U/L 27  Total Protein 6.5 - 8.1 g/dL 6.4 (L)  Total Bilirubin 0.3 - 1.2 mg/dL 0.8  GFR, Est Non African American >60 mL/min >60  WBC 4.0 - 10.5 K/uL 1.4 (L)  RBC 4.22 - 5.81 MIL/uL 2.78 (L)  Hemoglobin 13.0 - 17.0 g/dL 7.8 (L)  HCT 39.0 - 52.0 % 23.8 (L)  MCV 80.0 - 100.0 fL 85.6  MCH 26.0 - 34.0 pg 28.1  MCHC 30.0 - 36.0 g/dL 32.8  RDW 11.5 - 15.5 % 16.2 (H)  Platelets 150 - 400 K/uL 129 (L)   nRBC 0.0 - 0.2 % 0.0  Neutrophils % 68  Lymphocytes % 19  Monocytes Relative % 2  Eosinophil % 0  Basophil % 0  Immature Granulocytes % 11  NEUT# 1.7 - 7.7 K/uL 1.0 (L)  Lymphocyte # 0.7 - 4.0 K/uL 0.3 (L)  Monocyte # 0.1 - 1.0 K/uL 0.0 (L)  Eosinophils Absolute 0.0 - 0.5 K/uL 0.0  Basophils Absolute 0.0 - 0.1 K/uL 0.0  Abs Immature Granulocytes 0.00 - 0.07 K/uL 0.16 (H)  Ovalocytes  PRESENT  Schistocytes  PRESENT  Tear Drop Cells  PRESENT  WBC Morphology  MORPHOLOGY UNREMARKABLE  Smear Review  OCC LARGE PLTS SEEN    RADIOGRAPHIC STUDIES:  CT Renal Stone Study  Result Date: 02/21/2023 CLINICAL DATA:  Abdominal pain, back pain EXAM: CT ABDOMEN AND PELVIS WITHOUT CONTRAST TECHNIQUE: Multidetector CT imaging of the abdomen and pelvis was performed following the standard protocol without IV contrast. RADIATION DOSE REDUCTION: This exam was performed according to the departmental dose-optimization program which includes automated exposure control, adjustment of the mA and/or kV according to patient size and/or use of iterative reconstruction technique. COMPARISON:  11/10/2022 FINDINGS: Lower chest: There is no focal consolidation in the lower lung fields. Coronary artery calcifications are seen. Hepatobiliary: No focal abnormalities are seen in liver. There is no dilation of bile ducts. Gallbladder is unremarkable. Pancreas: No focal abnormalities are seen. Spleen: Unremarkable. Adrenals/Urinary Tract: Adrenals are unremarkable. There is no hydronephrosis. There are no renal or ureteral stones. There is increased density in the lumen of urinary bladder, possibly related to previously administered contrast. Stomach/Bowel: Stomach is not distended. Small bowel loops are not dilated. Appendix is not dilated. There is no significant wall thickening in colon. There is no pericolic stranding. Vascular/Lymphatic: There are scattered subcentimeter nodes in mesentery and retroperitoneum which appear less  prominent. No new significant lymphadenopathy is seen. Vascular structures are unremarkable. Reproductive: Unremarkable. Other: There is no ascites or pneumoperitoneum. Small umbilical hernia containing fat is seen. Bilateral inguinal hernias containing fat are noted, larger on the left side. Musculoskeletal: No acute findings are seen. IMPRESSION: There is no evidence of intestinal obstruction or pneumoperitoneum. There is no hydronephrosis. Appendix is not dilated. Coronary artery calcifications are seen. Electronically Signed   By: Elmer Picker M.D.   On: 02/21/2023 15:50   MR Lumbar Spine W Wo Contrast  Result Date: 02/21/2023 CLINICAL DATA:  Low back pain, weakness in both legs. History of Burkitt's lymphoma and HIV. EXAM: MRI LUMBAR SPINE WITHOUT AND WITH CONTRAST TECHNIQUE: Multiplanar and multiecho pulse sequences of the lumbar spine were obtained without and with intravenous contrast. CONTRAST:  70mL GADAVIST GADOBUTROL 1 MMOL/ML IV SOLN COMPARISON:  PET-CT 01/17/2023 FINDINGS: Segmentation: Standard; the lowest  formed disc space is designated L5-S1. Alignment:  Normal. Vertebrae: Vertebral body heights are preserved. Background marrow signal is normal. There is no suspicious marrow signal abnormality, marrow edema, or marrow enhancement. Conus medullaris and cauda equina: Conus extends to the L1 level. Conus and cauda equina appear normal. There is no abnormal enhancement of the cauda equina nerve roots. Paraspinal and other soft tissues: Unremarkable. Disc levels: There is disc desiccation without significant loss of height at L5-S1. The other disc spaces are preserved T12-L1: Unremarkable L1-L2: Unremarkable. L2-L3: Unremarkable. L3-L4: Unremarkable. L4-L5: There is a mild disc bulge and minimal facet arthropathy resulting in mild right worse than left neural foraminal stenosis without significant spinal canal stenosis L5-S1: There is a disc bulge with a central annular fissure, mild  degenerative endplate spurring, and mild facet arthropathy resulting in mild to moderate right and mild left neural foraminal stenosis without significant spinal canal stenosis. IMPRESSION: 1. No suspicious marrow signal abnormality or abnormal enhancement. 2. Mild degenerative change at L4-L5 and L5-S1 with mild-to-moderate right and mild left neural foraminal stenosis at L5-S1. Electronically Signed   By: Valetta Mole M.D.   On: 02/21/2023 11:34     ASSESSMENT/PLAN:   42 year old male with HIV/AIDS who presents to the clinic for a follow up for Burkitt's lymphoma.   #1  Stage II recently diagnosed Burkitt's lymphoma -Presenting with rapidly growing right neck mass extending into the chest, bilateral x-ray lymphadenopathy and also concern for possible involvement of retroperitoneal lymph nodes -Underwent core needle biopsy of right neck lymph node on 11/12/2022.Pathology confirmed Burkitt's lymphoma.  -Staging PET scan from 11/19/2022 showed at least stage II possibly stage III disease with enlarged intense hypermetabolic RIGHT cervical nodal mass extending from the level 2 nodal station to the RIGHT supraclavicular nodal station, superficial right neck mass extends to the skin surface superficial to the right sternocleidomastoid muscle, hypermetabolic small right axillary nodal disease.  -No clinical symptoms suggestive of CNS involvement at this time. Brain MRI from 11/19/2022 was negative.  -Discussed with patient and he is not interested in fertility preservation or sperm banking. -Completed 6 cycles of dose adjusted EPOCH-R chemotherapy.  First dose started on 11/18/2022. Completed on 03/10/2023.    #2 HIV/AIDS: -Confirmed HIV-1 infection in December 2020 with reactive 4th generation screening test and confirmatory presence of HIV-1 antibodies  -Viral load of 54,6000 and CD4 of 115 in August 2023. Started on Biktarvy and Bactrim. -Most recent viral load from 12/19/2022 was 77.  -Under the care  of Dr. Elna Breslow    #3 History of remote syphilis 3 to 4 years ago .   -RPR completed on 07/23/2022 was non-reactive. Previous titer of 1:14 and was treated with 2.4 million u nits of Bicillin and doxycycline.     #4 Pancytopenia: --Secondary to chemotherapy but accentuated by his HIV/AIDs and related medications   PLAN: -Labs today reviewed that shows pancytopenia with WBC 1.4, Hgb 7.8, Plt 129K. Creatinine and LFTs normal.  -Patient is recovering well after his 6th inpatient EPOCH-R treatment -Plan for post treatment PET scan in 2-3 weeks -NACL/NAHCOS mouthwash QID -dexamehtasone 4mg  po daily x 2 days then 2mg  po daily for 10 days to suppress G-CSF related bone pain and fevers. -levofloxacin and Acyclovir for neutropenic fever prevention -transfusion support for Hgb <7.5.    FOLLOW-UP: -Labs only on Friday 03/14/2023 -PET scan in 2 weeks -Labs and follow up with Dr.  Limbo in 3 weeks   All of the patient's questions were answered with apparent satisfaction.  The patient knows to call the clinic with any problems, questions or concerns.   I have spent a total of 30 minutes minutes of face-to-face and non-face-to-face time, preparing to see the patient, counseling and educating the patient, ordering medications/tests/procedures, documenting clinical information in the electronic health record, and care coordination.   Dede Query PA-C Dept of Hematology and [place] at [facility] Phone: [phone]

## 2023-03-14 ENCOUNTER — Other Ambulatory Visit: Payer: Self-pay

## 2023-03-14 ENCOUNTER — Inpatient Hospital Stay: Payer: 59

## 2023-03-14 DIAGNOSIS — C8378 Burkitt lymphoma, lymph nodes of multiple sites: Secondary | ICD-10-CM

## 2023-03-14 DIAGNOSIS — Z5112 Encounter for antineoplastic immunotherapy: Secondary | ICD-10-CM | POA: Diagnosis not present

## 2023-03-14 LAB — CMP (CANCER CENTER ONLY)
ALT: 22 U/L (ref 0–44)
AST: 8 U/L — ABNORMAL LOW (ref 15–41)
Albumin: 3.9 g/dL (ref 3.5–5.0)
Alkaline Phosphatase: 55 U/L (ref 38–126)
Anion gap: 9 (ref 5–15)
BUN: 14 mg/dL (ref 6–20)
CO2: 27 mmol/L (ref 22–32)
Calcium: 9.1 mg/dL (ref 8.9–10.3)
Chloride: 99 mmol/L (ref 98–111)
Creatinine: 0.86 mg/dL (ref 0.61–1.24)
GFR, Estimated: >60 mL/min (ref >60)
Glucose, Bld: 120 mg/dL — ABNORMAL HIGH (ref 70–99)
Potassium: 3.7 mmol/L (ref 3.5–5.1)
Sodium: 135 mmol/L (ref 135–145)
Total Bilirubin: 0.9 mg/dL (ref 0.3–1.2)
Total Protein: 6.5 g/dL (ref 6.5–8.1)

## 2023-03-14 LAB — CBC WITH DIFFERENTIAL (CANCER CENTER ONLY)
Abs Immature Granulocytes: 0 10*3/uL (ref 0.00–0.07)
Basophils Absolute: 0 10*3/uL (ref 0.0–0.1)
Basophils Relative: 0 %
Eosinophils Absolute: 0 10*3/uL (ref 0.0–0.5)
Eosinophils Relative: 0 %
HCT: 24.1 % — ABNORMAL LOW (ref 39.0–52.0)
Hemoglobin: 7.9 g/dL — ABNORMAL LOW (ref 13.0–17.0)
Immature Granulocytes: 0 %
Lymphocytes Relative: 69 %
Lymphs Abs: 0.3 10*3/uL — ABNORMAL LOW (ref 0.7–4.0)
MCH: 28 pg (ref 26.0–34.0)
MCHC: 32.8 g/dL (ref 30.0–36.0)
MCV: 85.5 fL (ref 80.0–100.0)
Monocytes Absolute: 0.1 10*3/uL (ref 0.1–1.0)
Monocytes Relative: 20 %
Neutro Abs: 0.1 10*3/uL — CL (ref 1.7–7.7)
Neutrophils Relative %: 11 %
Platelet Count: 62 10*3/uL — ABNORMAL LOW (ref 150–400)
RBC: 2.82 MIL/uL — ABNORMAL LOW (ref 4.22–5.81)
RDW: 16.5 % — ABNORMAL HIGH (ref 11.5–15.5)
WBC Count: 0.5 10*3/uL — CL (ref 4.0–10.5)
nRBC: 6.7 % — ABNORMAL HIGH (ref 0.0–0.2)

## 2023-03-14 MED ORDER — SODIUM CHLORIDE 0.9% FLUSH
10.0000 mL | Freq: Once | INTRAVENOUS | Status: AC
  Administered 2023-03-14: 10 mL

## 2023-03-14 MED ORDER — HEPARIN SOD (PORK) LOCK FLUSH 100 UNIT/ML IV SOLN
500.0000 [IU] | Freq: Once | INTRAVENOUS | Status: AC
  Administered 2023-03-14: 500 [IU]

## 2023-03-14 NOTE — Progress Notes (Signed)
0945: pt informed of lab results. Pt denies any discomfort. Pt instructed to wear a mask, stay at home, minimize his exposure to other people, frequent hand washing. Pt acknowledged information and verbalized understanding. Pt to call if he starts feeling bad or runs a fever. Pt instructed to got to the ED over the week if he develops any issues. Pt scheduled for labs Monday AM.

## 2023-03-17 ENCOUNTER — Inpatient Hospital Stay: Payer: 59 | Attending: Hematology

## 2023-03-17 ENCOUNTER — Other Ambulatory Visit: Payer: Self-pay

## 2023-03-17 DIAGNOSIS — Z79624 Long term (current) use of inhibitors of nucleotide synthesis: Secondary | ICD-10-CM | POA: Diagnosis not present

## 2023-03-17 DIAGNOSIS — B20 Human immunodeficiency virus [HIV] disease: Secondary | ICD-10-CM | POA: Insufficient documentation

## 2023-03-17 DIAGNOSIS — C8370 Burkitt lymphoma, unspecified site: Secondary | ICD-10-CM | POA: Diagnosis present

## 2023-03-17 DIAGNOSIS — C8378 Burkitt lymphoma, lymph nodes of multiple sites: Secondary | ICD-10-CM

## 2023-03-17 LAB — CMP (CANCER CENTER ONLY)
ALT: 35 U/L (ref 0–44)
AST: 21 U/L (ref 15–41)
Albumin: 3.8 g/dL (ref 3.5–5.0)
Alkaline Phosphatase: 81 U/L (ref 38–126)
Anion gap: 10 (ref 5–15)
BUN: 16 mg/dL (ref 6–20)
CO2: 27 mmol/L (ref 22–32)
Calcium: 9.5 mg/dL (ref 8.9–10.3)
Chloride: 96 mmol/L — ABNORMAL LOW (ref 98–111)
Creatinine: 0.95 mg/dL (ref 0.61–1.24)
GFR, Estimated: >60 mL/min (ref >60)
Glucose, Bld: 110 mg/dL — ABNORMAL HIGH (ref 70–99)
Potassium: 4 mmol/L (ref 3.5–5.1)
Sodium: 133 mmol/L — ABNORMAL LOW (ref 135–145)
Total Bilirubin: 0.9 mg/dL (ref 0.3–1.2)
Total Protein: 6.7 g/dL (ref 6.5–8.1)

## 2023-03-17 LAB — CBC WITH DIFFERENTIAL (CANCER CENTER ONLY)
Abs Immature Granulocytes: 1.18 10*3/uL — ABNORMAL HIGH (ref 0.00–0.07)
Basophils Absolute: 0.1 10*3/uL (ref 0.0–0.1)
Basophils Relative: 1 %
Eosinophils Absolute: 0 10*3/uL (ref 0.0–0.5)
Eosinophils Relative: 0 %
HCT: 25.9 % — ABNORMAL LOW (ref 39.0–52.0)
Hemoglobin: 8.4 g/dL — ABNORMAL LOW (ref 13.0–17.0)
Immature Granulocytes: 11 %
Lymphocytes Relative: 7 %
Lymphs Abs: 0.8 10*3/uL (ref 0.7–4.0)
MCH: 28.1 pg (ref 26.0–34.0)
MCHC: 32.4 g/dL (ref 30.0–36.0)
MCV: 86.6 fL (ref 80.0–100.0)
Monocytes Absolute: 2.4 10*3/uL — ABNORMAL HIGH (ref 0.1–1.0)
Monocytes Relative: 21 %
Neutro Abs: 6.8 10*3/uL (ref 1.7–7.7)
Neutrophils Relative %: 60 %
Platelet Count: 92 10*3/uL — ABNORMAL LOW (ref 150–400)
RBC: 2.99 MIL/uL — ABNORMAL LOW (ref 4.22–5.81)
RDW: 17.4 % — ABNORMAL HIGH (ref 11.5–15.5)
WBC Count: 11.2 10*3/uL — ABNORMAL HIGH (ref 4.0–10.5)
nRBC: 7 % — ABNORMAL HIGH (ref 0.0–0.2)

## 2023-03-17 MED ORDER — SODIUM CHLORIDE 0.9% FLUSH
10.0000 mL | Freq: Once | INTRAVENOUS | Status: AC
  Administered 2023-03-17: 10 mL

## 2023-03-17 MED ORDER — HEPARIN SOD (PORK) LOCK FLUSH 100 UNIT/ML IV SOLN
500.0000 [IU] | Freq: Once | INTRAVENOUS | Status: AC
  Administered 2023-03-17: 500 [IU]

## 2023-03-18 ENCOUNTER — Other Ambulatory Visit: Payer: Self-pay

## 2023-03-21 ENCOUNTER — Other Ambulatory Visit: Payer: Self-pay

## 2023-03-25 ENCOUNTER — Other Ambulatory Visit (HOSPITAL_COMMUNITY): Payer: Self-pay

## 2023-03-25 DIAGNOSIS — Z6834 Body mass index (BMI) 34.0-34.9, adult: Secondary | ICD-10-CM | POA: Diagnosis not present

## 2023-03-25 DIAGNOSIS — M869 Osteomyelitis, unspecified: Secondary | ICD-10-CM | POA: Diagnosis not present

## 2023-03-25 DIAGNOSIS — Z8249 Family history of ischemic heart disease and other diseases of the circulatory system: Secondary | ICD-10-CM | POA: Diagnosis not present

## 2023-03-25 DIAGNOSIS — J42 Unspecified chronic bronchitis: Secondary | ICD-10-CM | POA: Diagnosis not present

## 2023-03-25 DIAGNOSIS — Z811 Family history of alcohol abuse and dependence: Secondary | ICD-10-CM | POA: Diagnosis not present

## 2023-03-25 DIAGNOSIS — Z5986 Financial insecurity: Secondary | ICD-10-CM | POA: Diagnosis not present

## 2023-03-25 DIAGNOSIS — E669 Obesity, unspecified: Secondary | ICD-10-CM | POA: Diagnosis not present

## 2023-03-25 DIAGNOSIS — C140 Malignant neoplasm of pharynx, unspecified: Secondary | ICD-10-CM | POA: Diagnosis not present

## 2023-03-25 DIAGNOSIS — Z8572 Personal history of non-Hodgkin lymphomas: Secondary | ICD-10-CM | POA: Diagnosis not present

## 2023-03-25 DIAGNOSIS — F419 Anxiety disorder, unspecified: Secondary | ICD-10-CM | POA: Diagnosis not present

## 2023-03-25 DIAGNOSIS — C761 Malignant neoplasm of thorax: Secondary | ICD-10-CM | POA: Diagnosis not present

## 2023-03-25 DIAGNOSIS — B259 Cytomegaloviral disease, unspecified: Secondary | ICD-10-CM | POA: Diagnosis not present

## 2023-03-28 ENCOUNTER — Other Ambulatory Visit: Payer: Self-pay

## 2023-03-28 ENCOUNTER — Other Ambulatory Visit: Payer: Self-pay | Admitting: Hematology and Oncology

## 2023-03-28 ENCOUNTER — Other Ambulatory Visit (HOSPITAL_COMMUNITY): Payer: Self-pay

## 2023-03-28 DIAGNOSIS — C8378 Burkitt lymphoma, lymph nodes of multiple sites: Secondary | ICD-10-CM

## 2023-03-31 ENCOUNTER — Other Ambulatory Visit: Payer: Self-pay

## 2023-03-31 ENCOUNTER — Inpatient Hospital Stay: Payer: 59

## 2023-03-31 ENCOUNTER — Other Ambulatory Visit (HOSPITAL_COMMUNITY): Payer: Self-pay

## 2023-03-31 ENCOUNTER — Inpatient Hospital Stay (HOSPITAL_BASED_OUTPATIENT_CLINIC_OR_DEPARTMENT_OTHER): Payer: 59 | Admitting: Hematology

## 2023-03-31 VITALS — BP 124/74 | HR 91 | Temp 97.5°F | Resp 20 | Wt 289.7 lb

## 2023-03-31 DIAGNOSIS — R5383 Other fatigue: Secondary | ICD-10-CM | POA: Diagnosis not present

## 2023-03-31 DIAGNOSIS — C8378 Burkitt lymphoma, lymph nodes of multiple sites: Secondary | ICD-10-CM | POA: Diagnosis not present

## 2023-03-31 DIAGNOSIS — C8370 Burkitt lymphoma, unspecified site: Secondary | ICD-10-CM | POA: Diagnosis not present

## 2023-03-31 DIAGNOSIS — Z95828 Presence of other vascular implants and grafts: Secondary | ICD-10-CM

## 2023-03-31 LAB — CBC WITH DIFFERENTIAL (CANCER CENTER ONLY)
Abs Immature Granulocytes: 0.02 10*3/uL (ref 0.00–0.07)
Basophils Absolute: 0 10*3/uL (ref 0.0–0.1)
Basophils Relative: 1 %
Eosinophils Absolute: 0 10*3/uL (ref 0.0–0.5)
Eosinophils Relative: 0 %
HCT: 30.7 % — ABNORMAL LOW (ref 39.0–52.0)
Hemoglobin: 9.2 g/dL — ABNORMAL LOW (ref 13.0–17.0)
Immature Granulocytes: 1 %
Lymphocytes Relative: 29 %
Lymphs Abs: 0.9 10*3/uL (ref 0.7–4.0)
MCH: 29.1 pg (ref 26.0–34.0)
MCHC: 30 g/dL (ref 30.0–36.0)
MCV: 97.2 fL (ref 80.0–100.0)
Monocytes Absolute: 1.1 10*3/uL — ABNORMAL HIGH (ref 0.1–1.0)
Monocytes Relative: 34 %
Neutro Abs: 1.1 10*3/uL — ABNORMAL LOW (ref 1.7–7.7)
Neutrophils Relative %: 35 %
Platelet Count: 144 10*3/uL — ABNORMAL LOW (ref 150–400)
RBC: 3.16 MIL/uL — ABNORMAL LOW (ref 4.22–5.81)
RDW: 24.2 % — ABNORMAL HIGH (ref 11.5–15.5)
WBC Count: 3.1 10*3/uL — ABNORMAL LOW (ref 4.0–10.5)
nRBC: 1.6 % — ABNORMAL HIGH (ref 0.0–0.2)

## 2023-03-31 LAB — CMP (CANCER CENTER ONLY)
ALT: 31 U/L (ref 0–44)
AST: 15 U/L (ref 15–41)
Albumin: 3.5 g/dL (ref 3.5–5.0)
Alkaline Phosphatase: 51 U/L (ref 38–126)
Anion gap: 7 (ref 5–15)
BUN: 18 mg/dL (ref 6–20)
CO2: 25 mmol/L (ref 22–32)
Calcium: 8.6 mg/dL — ABNORMAL LOW (ref 8.9–10.3)
Chloride: 106 mmol/L (ref 98–111)
Creatinine: 0.96 mg/dL (ref 0.61–1.24)
GFR, Estimated: >60 mL/min (ref >60)
Glucose, Bld: 109 mg/dL — ABNORMAL HIGH (ref 70–99)
Potassium: 3.7 mmol/L (ref 3.5–5.1)
Sodium: 138 mmol/L (ref 135–145)
Total Bilirubin: 0.8 mg/dL (ref 0.3–1.2)
Total Protein: 5.8 g/dL — ABNORMAL LOW (ref 6.5–8.1)

## 2023-03-31 MED ORDER — HEPARIN SOD (PORK) LOCK FLUSH 100 UNIT/ML IV SOLN
500.0000 [IU] | Freq: Once | INTRAVENOUS | Status: AC
  Administered 2023-03-31: 500 [IU]

## 2023-03-31 MED ORDER — SODIUM CHLORIDE 0.9% FLUSH
10.0000 mL | Freq: Once | INTRAVENOUS | Status: AC
  Administered 2023-03-31: 10 mL

## 2023-03-31 NOTE — Progress Notes (Signed)
[facility] OFFICE CLINIC NOTE  Date of service.03/31/23   CC - post C5 EPOCHR for Burkitts for followup.  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  The patient presented with a rapidly growing right cervical nodal mass extending into the level 2 nodal station of the right supraclavicular nodal station and extension into the superficial right sternocleidomastoid muscle.  There is also hypermetabolic right axillary nodal disease.  No evidence of spleen or solid organ involvement.  No evidence of marrow involvement.  The patient was diagnosed in November/December 2023  HISTORY OF PRESENTING ILLNESS:  Logan Ward is a wonderful 42 y.o. male who has been referred to [place] by Dr Hanley Ben MD for evaluation and management of newly diagnosed Burkitt's lymphoma in the setting of known history of HIV/AIDS.   Patient has a history of HIV/AIDS [last CD4 count on 10/21/2022 of 501 and viral load of 324 copies per mL, acquired through by sexual contact, diagnosed 3 to 4 months ago, follows with [name] Calone], syphilis, obesity presented to the hospital on 11/10/2022 with rapidly growing right anterior chest wall mass.   CT of the neck on 11/10/2022 showed large infiltrative centrally necrotic soft tissue mass throughout the right neck and extending to the upper chest anterior to the clavicle measuring 8.1 x 5.9 x 7.4 cm in size.  There are numerous additional prominent right sided cervical chain lymph nodes measuring 1 cm at level 2A and 1.1 cm in the posterior triangle. CT chest abdomen pelvis done on 11/10/2022 showed enlarged right lower cervical, supraclavicular and bilaterally axillary lymph nodes.  Largest right axillary lymph node measuring 2.2 x 1.7 cm.  Diffuse fat stranding throughout the superior mediastinum.  Prominent subcentimeter retroperitoneal and iliac lymph nodes.  Diffuse retroperitoneal fat stranding of uncertain significance.  No evidence of mass or  evidence of organ metastatic disease in the abdomen or pelvis.  Patient had an ultrasound-guided core needle biopsy of the right neck mass on 11/12/2022 which shows B-cell non-Hodgkin's lymphoma with high-grade features and findings consistent with Burkitt's lymphoma.    PRIOR THERAPY: None  CURRENT THERAPY: Status post 2 cycles of dose adjusted EPOCH-R chemotherapy.  First dose started on 11/18/22.   INTERVAL HISTORY:  Logan Ward 42 y.o. male with Burkitt's lymphoma who is being followed-up for toxicity check.   Patient was last seen by PA Thayil on 03/11/2023 and he was doing well overall with improved energy after hospital discharge.   Patient reports he has been doing well overall since he got discharged from hospital. He complains of bilateral leg/hand swelling, dizziness, diarrhea, and bilateral leg weakness. He reports that his dizziness is worse at night when he closes his eyes. Patient also complains of hyperpigmentation on his tongue and nails. He does note that he has been eating well and staying well hydrated.   He reports that he has gained around 10-15 lbs after dropping chemotherapy.   He denies fever, chills, night sweats, enlarged lymph nodes, back pain, abdominal pain, chest pain abnormal bowel movement other than diarrhea. He does complain of mild shortness of breath while walking.   Patient notes he has not been staying physically active and will try to walk more often.    MEDICAL HISTORY: Past Medical History:  Diagnosis Date   Anal fissure    HIV infection (HCC)    Internal hemorrhoids    Obesity    Rectal ulcer     ALLERGIES:  has No Known Allergies.  MEDICATIONS:  Current Outpatient Medications  Medication Sig Dispense Refill   acyclovir (ZOVIRAX) 400 MG tablet Take 1 tablet (400 mg total) by mouth 2 (two) times daily. 30 tablet 0   B Complex-C (B-COMPLEX WITH VITAMIN C) tablet Take 1 tablet by mouth daily. 30 tablet 11    bictegravir-emtricitabine-tenofovir AF (BIKTARVY) 50-200-25 MG TABS tablet Take 1 tablet by mouth daily. 30 tablet 5   dexamethasone (DECADRON) 4 MG tablet Take 1 tablet (4mg ) daily for 2 day after treatment and then 1/2 tablet (2mg ) by mouth daily for 10 days. 30 tablet 0   levofloxacin (LEVAQUIN) 500 MG tablet Take 1 tablet (500 mg total) by mouth daily. Starting taking day after Rituxan infusion 10 tablet 0   LORazepam (ATIVAN) 0.5 MG tablet Take 0.5 mg by mouth every 8 (eight) hours as needed for anxiety.     senna-docusate (SENOKOT-S) 8.6-50 MG tablet Take 2 tablets by mouth at bedtime as needed for mild constipation. 60 tablet 0   sulfamethoxazole-trimethoprim (BACTRIM DS) 800-160 MG tablet Take 1 tablet by mouth daily. 30 tablet 1   No current facility-administered medications for this visit.    SURGICAL HISTORY:  Past Surgical History:  Procedure Laterality Date   IR IMAGING GUIDED PORT INSERTION  11/18/2022    REVIEW OF SYSTEMS:    10 Point review of Systems was done is negative except as noted above.   PHYSICAL EXAMINATION: PHONE VISIT .BP 124/74   Pulse 91   Temp (!) 97.5 F (36.4 C)   Resp 20   Wt 289 lb 11.2 oz (131.4 kg)   SpO2 100%   BMI 38.22 kg/m  . GENERAL:alert, in no acute distress and comfortable SKIN: no acute rashes, no significant lesions EYES: conjunctiva are pink and non-injected, sclera anicteric OROPHARYNX: MMM, no exudates, no oropharyngeal erythema or ulceration NECK: supple, no JVD LYMPH:  no palpable lymphadenopathy in the cervical, axillary or inguinal regions LUNGS: clear to auscultation b/l with normal respiratory effort HEART: regular rate & rhythm ABDOMEN:  normoactive bowel sounds , non tender, not distended. Extremity: no pedal edema PSYCH: alert & oriented x 3 with fluent speech NEURO: no focal motor/sensory deficits  ECOG PERFORMANCE STATUS: 1   LABORATORY DATA:  .[name]    Latest Ref Rng & Units 03/31/2023    9:11 AM 03/17/2023     9:10 AM 03/14/2023    9:07 AM  CBC  WBC 4.0 - 10.5 K/uL 3.1  11.2  0.5   Hemoglobin 13.0 - 17.0 g/dL 9.2  8.4  7.9   Hematocrit 39.0 - 52.0 % 30.7  25.9  24.1   Platelets 150 - 400 K/uL 144  92  62    .    Latest Ref Rng & Units 03/31/2023    9:11 AM 03/17/2023    9:10 AM 03/14/2023    9:07 AM  CMP  Glucose 70 - 99 mg/dL 161  096  045   BUN 6 - 20 mg/dL 18  16  14    Creatinine 0.61 - 1.24 mg/dL 4.09  8.11  9.14   Sodium 135 - 145 mmol/L 138  133  135   Potassium 3.5 - 5.1 mmol/L 3.7  4.0  3.7   Chloride 98 - 111 mmol/L 106  96  99   CO2 22 - 32 mmol/L 25  27  27    Calcium 8.9 - 10.3 mg/dL 8.6  9.5  9.1   Total Protein 6.5 - 8.1 g/dL 5.8  6.7  6.5  Total Bilirubin 0.3 - 1.2 mg/dL 0.8  0.9  0.9   Alkaline Phos 38 - 126 U/L 51  81  55   AST 15 - 41 U/L ALT 0 - 44 U/L 31  35  22      RADIOGRAPHIC STUDIES:  No results found.   ASSESSMENT/PLAN:   42 year old male with HIV/AIDS with newly diagnosed Burkitt's lymphoma   #1  Stage II recently diagnosed Burkitt's lymphoma Presenting with rapidly growing right neck mass extending into the chest, bilateral x-ray lymphadenopathy and also concern for possible involvement of retroperitoneal lymph nodes. Concerning for at least stage II possibly stage III disease. Some borderline lymphadenopathy in the abdomen could also be from his recently diagnosed HIV/AIDS. No clinical symptoms suggestive of CNS involvement at this time. Brain MRI negative.  No constitutional symptoms. No significant cytopenias to suggest bone marrow involvement. Discussed with patient and he is not interested in fertility preservation or sperm banking. First presented in August at [facility] and was treated empirically with steroids and antibiotics with improvement in swelling prior to additional progression. -Currently on dose adjusted EPOCH-R. Status post 1 cycle. Tolerated well overall.  -Scheduled for cycle #2 on 12/10/22. Will help  facilitate inpatient admission. -Outpatient follow up on 12/17/22 for Rituxan and Neulasta   #2 HIV/AIDS -Following Dr. Carver Fila from ID and currently taking Biktarvy.   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  Patient reports this was completely treated.   #4 Prophylaxis: Continue salt water and baking soda mouthwash rinses 4 times a day to reduce mucositis.  MiraLAX and senna as for constipation Bactrim for prophylactic PJP.   #6:Decreased appetite, nausea, belching, and weight loss: Patient has lost weight since last being seen.  Drug to drug interactions noted with PPIs methotrexate.  Patient encouraged to try Pepcid and take nausea medicine prior to eating.  I sent a prescription for Compazine to alternate with Zofran if needed for nausea.  I will refer the patient to member the nutritionist team.  Patient Dors is some sensation of feeling like food is getting stuck in his esophagus.  I offered referral to GI.  The patient declined at this time.  Patient was open to trying Remeron which is an antidepressant but it can help patient's with insomnia and decreased appetite.  I sent a prescription for this to the patient's pharmacy.  #7: Thrombocytopenia: Platelets have normalized during previous treatment but with nadir down to 71k    PLAN: -Discussed lab results from today, 03/31/2023, with the patient. CBC shows slightly decreased WBC of 3.1 K, decreased hemoglobin of 9.2, decreased hematocrit of 30.7, and decreased platelets of 144 K. CMP is stable overall. CBC shows improvement.  -Patient's PET scan is scheduled on 04/04/2023. Treatment plan according to the PET scan results.  -Recommended to exercise, at least brisk walking for 30 minutes, and drinking about 2L of water.  -Recommended cancer center rehab to regain physical strength. Patient agrees with is plan.  -Referral to cancer rehab.  -Answered all of patient and his family member's questions.  -Continue acyclovir until Mid-May  (2 months after the patient's last treatment). -Continue to eat healthy, avoid salty food.  -Will send referral to radiation oncologist.  -Educated the patient that hyperpigmentation is due to chemotherapy, which will improve.  -Patient does not need blood transfusion during this visit.  -Recommended using compression socks to reduce leg swelling.    FOLLOW-UP: Pending  PET/Ct results  The total time spent in the appointment was 30 minutes* .  All of the patient's questions were answered with apparent satisfaction. The patient knows to call the clinic with any problems, questions or concerns.   Wyvonnia Lora MD MS AAHIVMS [facility] Hematology/Oncology Physician [facility]  .*Total Encounter Time as defined by the Centers for Medicare and Medicaid Services includes, in addition to the face-to-face time of a patient visit (documented in the note above) non-face-to-face time: obtaining and reviewing outside history, ordering and reviewing medications, tests or procedures, care coordination (communications with other health care professionals or caregivers) and documentation in the medical record.   Colonel Bald, am acting as a Neurosurgeon for Wyvonnia Lora, MD.

## 2023-04-03 ENCOUNTER — Other Ambulatory Visit: Payer: Self-pay

## 2023-04-03 ENCOUNTER — Ambulatory Visit: Payer: 59 | Admitting: Family

## 2023-04-04 ENCOUNTER — Other Ambulatory Visit (HOSPITAL_COMMUNITY): Payer: Self-pay

## 2023-04-04 ENCOUNTER — Ambulatory Visit (HOSPITAL_COMMUNITY)
Admission: RE | Admit: 2023-04-04 | Discharge: 2023-04-04 | Disposition: A | Discharge status: Home / Self Care | Payer: 59 | Source: Ambulatory Visit | Attending: Physician Assistant | Admitting: Physician Assistant

## 2023-04-04 DIAGNOSIS — C8378 Burkitt lymphoma, lymph nodes of multiple sites: Secondary | ICD-10-CM | POA: Diagnosis present

## 2023-04-04 LAB — GLUCOSE, CAPILLARY: Glucose-Capillary: 100 mg/dL — ABNORMAL HIGH (ref 70–99)

## 2023-04-04 MED ORDER — FLUDEOXYGLUCOSE F - 18 (FDG) INJECTION
14.4000 | Freq: Once | INTRAVENOUS | Status: AC
  Administered 2023-04-04: 13.81 via INTRAVENOUS

## 2023-04-06 ENCOUNTER — Encounter: Payer: Self-pay | Admitting: Hematology

## 2023-04-07 ENCOUNTER — Other Ambulatory Visit: Payer: Self-pay

## 2023-04-07 ENCOUNTER — Other Ambulatory Visit (HOSPITAL_COMMUNITY): Payer: Self-pay

## 2023-04-07 ENCOUNTER — Telehealth: Payer: Self-pay

## 2023-04-07 ENCOUNTER — Encounter: Payer: Self-pay | Admitting: Hematology

## 2023-04-07 NOTE — Telephone Encounter (Signed)
[facility] Patient Advocate Encounter   Was successful in obtaining a [employer] copay card for Biktarvy.  This copay card will make the patients copay $0.00.  I have spoken with the patient.    The billing information is as follows and has been shared with [facility].         Tenika Keeran, CPhT [facility] Patient Advocate [facility] Phone: [phone] Fax:  [phone]  

## 2023-04-08 ENCOUNTER — Other Ambulatory Visit: Payer: Self-pay

## 2023-04-08 ENCOUNTER — Ambulatory Visit (INDEPENDENT_AMBULATORY_CARE_PROVIDER_SITE_OTHER): Payer: 59 | Admitting: Family

## 2023-04-08 ENCOUNTER — Encounter: Payer: Self-pay | Admitting: Family

## 2023-04-08 ENCOUNTER — Other Ambulatory Visit (HOSPITAL_COMMUNITY): Payer: Self-pay

## 2023-04-08 VITALS — BP 121/85 | HR 81 | Temp 97.9°F | Resp 16 | Wt 292.6 lb

## 2023-04-08 DIAGNOSIS — B20 Human immunodeficiency virus [HIV] disease: Secondary | ICD-10-CM

## 2023-04-08 DIAGNOSIS — C8371 Burkitt lymphoma, lymph nodes of head, face, and neck: Secondary | ICD-10-CM | POA: Diagnosis not present

## 2023-04-08 MED ORDER — BIKTARVY 50-200-25 MG PO TABS
1.0000 | ORAL_TABLET | Freq: Every day | ORAL | 5 refills | Status: DC
Start: 2023-04-08 — End: 2023-09-26
  Filled 2023-04-08 – 2023-05-08 (×2): qty 30, 30d supply, fill #0
  Filled 2023-06-25: qty 30, 30d supply, fill #1
  Filled 2023-07-23: qty 30, 30d supply, fill #2
  Filled 2023-08-20: qty 30, 30d supply, fill #3
  Filled 2023-09-16 (×2): qty 30, 30d supply, fill #4

## 2023-04-08 NOTE — Therapy (Signed)
OUTPATIENT PHYSICAL THERAPY LOWER EXTREMITY ONCOLOGY EVALUATION  Patient Name: Logan Ward MRN: [id number] DOB:1981/08/29, 42 y.o., male Today's Date: 04/09/2023  END OF SESSION:  PT End of Session - 04/09/23 1008     Date for PT Re-Evaluation 06/04/23    Authorization Type none needed    PT Start Time 0910    PT Stop Time 0950    PT Time Calculation (min) 40 min    Activity Tolerance Patient tolerated treatment well    Behavior During Therapy [facility] for tasks assessed/performed             Past Medical History:  Diagnosis Date   Anal fissure    HIV infection    Internal hemorrhoids    Obesity    Rectal ulcer    Past Surgical History:  Procedure Laterality Date   IR IMAGING GUIDED PORT INSERTION  11/18/2022   Patient Active Problem List   Diagnosis Date Noted   Intractable back pain suspect Granix-related 02/21/2023   Lactic acidosis 02/21/2023   Thrombocytopenia 02/01/2023   Hyponatremia 01/31/2023   Hypokalemia 01/23/2023   Hematuria 01/11/2023   Neutropenic fever 01/10/2023   Pancytopenia 01/10/2023   Acute cystitis 01/10/2023   Anemia 01/01/2023   Encounter for chemotherapy management 12/12/2022   Burkitt's lymphoma 12/10/2022   Decreased appetite 12/02/2022   Drug-induced constipation 11/21/2022   Nausea without vomiting 11/21/2022   Acute nonintractable headache 11/21/2022   Encounter for antineoplastic chemotherapy 11/18/2022   Burkitt lymphoma of lymph nodes of multiple regions 11/14/2022   Counseling regarding advance care planning and goals of care 11/14/2022   Chest mass 11/10/2022   Exposure to chlamydia 10/24/2022   Submental lymphadenopathy 09/05/2022   Goals of care, counseling/discussion 07/26/2022   History of syphilis 07/26/2022   Neck swelling 07/26/2022   AIDS (acquired immune deficiency syndrome) 07/25/2022    REFERRING PROVIDER: Dr. Candise Che  REFERRING DIAG:  Diagnosis  C83.78 (ICD-10-CM) - Burkitt lymphoma of lymph nodes of  multiple regions  R53.83 (ICD-10-CM) - Other fatigue    THERAPY DIAG:  Burkitt lymphoma of lymph nodes of multiple regions  Muscle weakness (generalized)  Rationale for Evaluation and Treatment: Rehabilitation  ONSET DATE: 10/2022  SUBJECTIVE:                                                                                                                                                                                           SUBJECTIVE STATEMENT:  Trouble getting back into everything I used to do. Daily activities are still hard.  I wrote my name for the first time today signing in.    PERTINENT HISTORY: HIV/AIDS now  with burkitt's lymphoma resulting in right neck and upper chest mass with numerous Rt cervical nodes enlarged and bil axillary nodes, no mets, EPOCH-R chemotherapy started 11/18/22 (etoposide phosphate, Oncovin, cyclophosphamide, and doxorubicin hydrochloride (hydroxydaunomycin), the targeted therapy drug rituximab, and the steroid hormone prednisone). And ended 03/10/23. Post treatment PET showing no residual lymphoma. Chemo was performed inpatient chemo.   PAIN:  Are you having pain? Yes: NPRS scale: 8/10 Pain location: both legs lower legs  Pain description: achy  Aggravating factors: doing things - starts to hurt after of doing things Relieving factors: stopping activity   PRECAUTIONS: could have low blood counts  WEIGHT BEARING RESTRICTIONS: No  FALLS:  Has patient fallen in last 6 months? 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    LEISURE: tennis   HAND DOMINANCE: right   PRIOR LEVEL OF FUNCTION: Independent  PATIENT GOALS: get back to normal function    OBJECTIVE:  COGNITION:  Overall cognitive status: Within functional limits for tasks  assessed   SENSATION: Intact darco foot sensation testing   LE ROM:   LE MMT:   MMT Right 04/09/2023 Left 04/09/2023  Hip flexion Able to lift SLR around 30deg Able to lift SLR around 35deg  Hip extension    Hip abduction 4+ 4+  Hip adduction    Hip internal rotation    Hip external rotation    Knee flexion 4 4  Knee extension 4 4  Ankle dorsiflexion 5 5  Ankle plantarflexion    Ankle inversion    Ankle eversion     (Blank rows = not tested)  FUNCTIONAL TESTS:  5 times sit to stand: 19 second using hands: is able to do sit to stand x 1 without hands. >15seconds predicts recurrent falls.    6 minute walk test: 344ft 71min41sec 5/10 RPE. Starting vitals: HR 72, O2 98%, ending vitals HR 86, O2 99%  MCTSIB:   LEFS: 30% functioning  TODAY'S TREATMENT:                                                                                                                                         DATE:  04/09/23 Eval performed: encouraged walking and sit to stand at home  PATIENT EDUCATION:  Education details: POC, walking, sit to stand Person educated: Patient Education method: Explanation Education comprehension: verbalized understanding  HOME EXERCISE PROGRAM: Walking time, sit to stand  ASSESSMENT:  CLINICAL IMPRESSION: Patient is a 42 y.o. male who was seen today for physical therapy evaluation and treatment for decreased functioning after treatment for B cell NHL.  This was completed inpatient due to blood count drops so he was  and out of the  hospital weekly for 5-6 months to complete treatment.  Recent PET scan showing no signs of lymphoma currently.  Pt is getting help with ADLs including bathing and food preparation.  He reports overall no activity and feeling very weak and fatigued.  He is having lower leg pain and edema that increases with activity but is better with compression stockings.  He has neuropathy but intact foot sensation on testing.  He is also have some signs  of vertigo/BPPV so we will look at this as needed.  His Hgb and hematocrit are still low but does not need intervention.    OBJECTIVE IMPAIRMENTS: cardiopulmonary status limiting activity, decreased activity tolerance, decreased endurance, and decreased strength.   ACTIVITY LIMITATIONS: carrying, lifting, standing, stairs, bathing, and self feeding  PARTICIPATION LIMITATIONS: meal prep, cleaning, laundry, community activity, and occupation  REHAB POTENTIAL: Excellent  CLINICAL DECISION MAKING: Evolving/moderate complexity  EVALUATION COMPLEXITY: Moderate   GOALS: Goals reviewed with patient? Yes  SHORT TERM GOALS: Target date: 04/20/23  Pt will start on a home walking program to increase stamina and endurance Baseline: Goal status: INITIAL   LONG TERM GOALS: Target date: 06/04/23  Pt will increase walk time to walking a full distance without rest at least 1270ft Baseline:  Goal status: INITIAL  2.  Pt will improve 5x sit to stand to <10 seconds Baseline:  Goal status: INITIAL  3.  Pt will transition to HEP at the gym  Baseline:  Goal status: INITIAL  4. Pt will improve LEFS to 34/80 to demonstrate improved functioning     PLAN:  PT FREQUENCY: 2x/week  PT DURATION: 8 weeks  PLANNED INTERVENTIONS: Therapeutic exercises, Therapeutic activity, Neuromuscular re-education, Balance training, Gait training, Patient/Family education, Self Care, and Re-evaluation  PLAN FOR NEXT SESSION: nustep, gait, assess balance, start general TE - no mets or precautions   Idamae Lusher, PT 04/09/2023, 10:09 AM

## 2023-04-08 NOTE — Progress Notes (Unsigned)
Brief Narrative   Patient ID: Logan Ward, male    DOB: Sep 03, 1981, 42 y.o.   MRN: [id number]  Mr. Leichter is a 42 y/o AA male diagnosed with HIV disease in December 2020 and starting care after ED visit on 07/25/22 with initial viral load of 54,600 and CD4 count of 115. Risk factor for HIV acquisition is bisexual contact. Developed Burkitt's lymphoma and currently undergoing chemotherapy.Josie Dixon care at [facility] Stage 3. Initial lab work include Genotype are in progress. Started on Biktarvy and Bactrim.     Subjective:    No chief complaint on file.   HPI:  Logan Ward is a 42 y.o. male    No Known Allergies    Outpatient Medications Prior to Visit  Medication Sig Dispense Refill   acyclovir (ZOVIRAX) 400 MG tablet Take 1 tablet (400 mg total) by mouth 2 (two) times daily. 30 tablet 0   B Complex-C (B-COMPLEX WITH VITAMIN C) tablet Take 1 tablet by mouth daily. 30 tablet 11   bictegravir-emtricitabine-tenofovir AF (BIKTARVY) 50-200-25 MG TABS tablet Take 1 tablet by mouth daily. 30 tablet 5   dexamethasone (DECADRON) 4 MG tablet Take 1 tablet ( ) daily for 2 day after treatment and then 1/2 tablet ( ) by mouth daily for 10 days. 30 tablet 0   levofloxacin (LEVAQUIN) 500 MG tablet Take 1 tablet (500 mg total) by mouth daily. Starting taking day after Rituxan infusion 10 tablet 0   LORazepam (ATIVAN) 0.5 MG tablet Take 0.5 mg by mouth every 8 (eight) hours as needed for anxiety.     senna-docusate (SENOKOT-S) 8.6-50 MG tablet Take 2 tablets by mouth at bedtime as needed for mild constipation. 60 tablet 0   sulfamethoxazole-trimethoprim (BACTRIM DS) 800-160 MG tablet Take 1 tablet by mouth daily. 30 tablet 1   No facility-administered medications prior to visit.     Past Medical History:  Diagnosis Date   Anal fissure    HIV infection    Internal hemorrhoids    Obesity    Rectal ulcer      Past Surgical History:  Procedure Laterality Date   IR IMAGING GUIDED PORT  INSERTION  11/18/2022      Review of Systems    Objective:    BP 121/85   Pulse 81   Temp 97.9 F (36.6 C) (Oral)   Resp 16   Wt 292 lb 9.6 oz (132.7 kg)   SpO2 100%   BMI 38.60 kg/m  Nursing note and vital signs reviewed.  Physical Exam      04/08/2023    3:02 PM 01/15/2023    9:22 AM 12/19/2022    1:46 PM 10/24/2022    9:41 AM 08/28/2022   11:04 AM  Depression screen PHQ 2/9  Decreased Interest 0 0 0 0 0  Down, Depressed, Hopeless 0 0 0 0 1  PHQ - 2 Score 0 0 0 0 1       Assessment & Plan:    Patient Active Problem List   Diagnosis Date Noted   Intractable back pain suspect Granix-related 02/21/2023   Lactic acidosis 02/21/2023   Thrombocytopenia 02/01/2023   Hyponatremia 01/31/2023   Hypokalemia 01/23/2023   Hematuria 01/11/2023   Neutropenic fever 01/10/2023   Pancytopenia 01/10/2023   Acute cystitis 01/10/2023   Anemia 01/01/2023   Encounter for chemotherapy management 12/12/2022   Burkitt's lymphoma 12/10/2022   Decreased appetite 12/02/2022   Drug-induced constipation 11/21/2022   Nausea without vomiting 11/21/2022   Acute  nonintractable headache 11/21/2022   Encounter for antineoplastic chemotherapy 11/18/2022   Burkitt lymphoma of lymph nodes of multiple regions 11/14/2022   Counseling regarding advance care planning and goals of care 11/14/2022   Chest mass 11/10/2022   Exposure to chlamydia 10/24/2022   Submental lymphadenopathy 09/05/2022   Goals of care, counseling/discussion 07/26/2022   History of syphilis 07/26/2022   Neck swelling 07/26/2022   AIDS (acquired immune deficiency syndrome) 07/25/2022     Problem List Items Addressed This Visit   None    I am having Lemont L. Cato maintain his Biktarvy, senna-docusate, LORazepam, dexamethasone, B-complex with vitamin C, levofloxacin, acyclovir, and sulfamethoxazole-trimethoprim.   No orders of the defined types were placed in this encounter.    Follow-up: No follow-ups on  file.   Marcos Eke, MSN, FNP-C Nurse Practitioner [facility] for Infectious Disease [facility] Main number: [phone]

## 2023-04-08 NOTE — Patient Instructions (Addendum)
Nice to see you.  We will check your lab work today.  Continue to take your medication daily as prescribed.  Refills have been sent to the pharmacy.  Plan for follow up in 1 months or sooner if needed with lab work on the same day.  Have a great day and stay safe!  

## 2023-04-09 ENCOUNTER — Ambulatory Visit: Payer: 59 | Attending: Hematology | Admitting: Rehabilitation

## 2023-04-09 ENCOUNTER — Encounter: Payer: Self-pay | Admitting: Rehabilitation

## 2023-04-09 ENCOUNTER — Other Ambulatory Visit: Payer: Self-pay

## 2023-04-09 DIAGNOSIS — M6281 Muscle weakness (generalized): Secondary | ICD-10-CM | POA: Insufficient documentation

## 2023-04-09 DIAGNOSIS — C8378 Burkitt lymphoma, lymph nodes of multiple sites: Secondary | ICD-10-CM | POA: Diagnosis present

## 2023-04-09 LAB — T-HELPER CELL (CD4) - (RCID CLINIC ONLY)
CD4 % Helper T Cell: 8 % — ABNORMAL LOW (ref 33–65)
CD4 T Cell Abs: 127 /uL — ABNORMAL LOW (ref 400–1790)

## 2023-04-09 NOTE — Assessment & Plan Note (Signed)
Logan Ward has well controlled virus with good adherence and tolerance to Biktarvy with Bactrim for OI prophylaxis. Reviewed previous lab work and discussed plan of care. Check lab work today. Recommend continuing Bactrim pending any additional need for radiation/chemotherapy for lymphoma. If no additional treatment is indicated it would be safe for him to stop the Bactrim as his last CD4 count 3 months ago was 190 and he was maintained undetectable. Will plan for follow up in 1 month or sooner with lab work on the same day.

## 2023-04-09 NOTE — Assessment & Plan Note (Signed)
Mr. Orvan July recently completed a PET scan and awaiting results to determine any additional need for treatment. Await plan per Oncology.

## 2023-04-10 ENCOUNTER — Other Ambulatory Visit: Payer: Self-pay

## 2023-04-10 LAB — HIV-1 RNA QUANT-NO REFLEX-BLD
HIV 1 RNA Quant: 93 Copies/mL — ABNORMAL HIGH
HIV-1 RNA Quant, Log: 1.97 Log cps/mL — ABNORMAL HIGH

## 2023-04-14 ENCOUNTER — Encounter: Payer: Self-pay | Admitting: Physical Therapy

## 2023-04-14 ENCOUNTER — Ambulatory Visit: Payer: 59 | Admitting: Physical Therapy

## 2023-04-14 ENCOUNTER — Telehealth: Payer: Self-pay | Admitting: Hematology

## 2023-04-14 DIAGNOSIS — C8378 Burkitt lymphoma, lymph nodes of multiple sites: Secondary | ICD-10-CM

## 2023-04-14 DIAGNOSIS — M6281 Muscle weakness (generalized): Secondary | ICD-10-CM

## 2023-04-14 NOTE — Therapy (Signed)
OUTPATIENT PHYSICAL THERAPY LOWER EXTREMITY ONCOLOGY TREAMTMENT  Patient Name: Logan Ward MRN: [id number] DOB:1981-07-21, 42 y.o., male Today's Date: 04/14/2023  END OF SESSION:  PT End of Session - 04/14/23 1003     Visit Number 2    Number of Visits 13    Date for PT Re-Evaluation 06/04/23    Authorization Type none needed    PT Start Time 1003    PT Stop Time 1051    PT Time Calculation (min) 48 min    Activity Tolerance Patient tolerated treatment well    Behavior During Therapy WFL for tasks assessed/performed             Past Medical History:  Diagnosis Date   Anal fissure    HIV infection (HCC)    Internal hemorrhoids    Obesity    Rectal ulcer    Past Surgical History:  Procedure Laterality Date   IR IMAGING GUIDED PORT INSERTION  11/18/2022   Patient Active Problem List   Diagnosis Date Noted   Intractable back pain suspect Granix-related 02/21/2023   Lactic acidosis 02/21/2023   Thrombocytopenia (HCC) 02/01/2023   Hyponatremia 01/31/2023   Hypokalemia 01/23/2023   Hematuria 01/11/2023   Neutropenic fever (HCC) 01/10/2023   Pancytopenia (HCC) 01/10/2023   Acute cystitis 01/10/2023   Anemia 01/01/2023   Encounter for chemotherapy management 12/12/2022   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HCC) 12/10/2022   Decreased appetite 12/02/2022   Drug-induced constipation 11/21/2022   Nausea without vomiting 11/21/2022   Acute nonintractable headache 11/21/2022   Encounter for antineoplastic chemotherapy 11/18/2022   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 I feel tired and drained.  PERTINENT HISTORY: HIV/AIDS now with burkitt's lymphoma resulting in right neck and upper chest mass with numerous Rt cervical nodes enlarged and bil axillary nodes, no mets, EPOCH-R chemotherapy started 11/18/22 (etoposide phosphate, Oncovin, cyclophosphamide, and doxorubicin hydrochloride (hydroxydaunomycin), the targeted therapy drug rituximab, and the steroid hormone prednisone). And ended 03/10/23. Post treatment PET showing no residual lymphoma. Chemo was performed inpatient chemo.   PAIN:  Are you having pain? No  PRECAUTIONS: could have low blood counts  WEIGHT BEARING RESTRICTIONS: No  FALLS:  Has patient fallen in last 6 months? 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    LEISURE: tennis   HAND DOMINANCE: right   PRIOR LEVEL OF FUNCTION: Independent  PATIENT GOALS: get back to normal function    OBJECTIVE:  COGNITION:  Overall cognitive status: Within functional limits for tasks assessed   SENSATION: Intact darco foot sensation testing   LE ROM:   LE MMT:   MMT Right 04/14/2023 Left 04/14/2023  Hip flexion Able to lift SLR around 30deg Able to lift SLR around  35deg  Hip extension    Hip abduction 4+ 4+  Hip adduction    Hip internal rotation    Hip external rotation    Knee flexion 4 4  Knee extension 4 4  Ankle dorsiflexion 5 5  Ankle plantarflexion    Ankle inversion    Ankle eversion     (Blank rows = not tested)  FUNCTIONAL TESTS:  5 times sit to stand: 19 second using hands: is able to do sit to stand x 1 without hands. >15seconds predicts recurrent falls.    6 minute walk test: 368ft 50min41sec 5/10 RPE. Starting vitals: HR 72, O2 98%, ending vitals HR 86, O2 99%  MCTSIB:   LEFS: 30% functioning  BALNCE TESTING:  04/14/23: SLS on R 18 sec on L 7 sec   Berg Balance Test Sit to Stand: Able to stand without using hands and stabilize independently Standing Unsupported: Able to stand safely 2 minutes Sitting with Back Unsupported but Feet Supported on Floor or Stool: Able to sit safely and securely 2 minutes Stand to Sit: Sits safely with minimal use of hands Transfers: Able to transfer safely, minor use of hands Standing Unsupported with Eyes Closed: Able to stand 10 seconds safely Standing Ubsupported with Feet Together: Able to place feet together independently and stand 1 minute safely From Standing, Reach Forward with Outstretched Arm: Can reach confidently >25 cm (10") From Standing Position, Pick up Object from Floor: Unable to try/needs assist to keep balance From Standing Position, Turn to Look Behind Over each Shoulder: Looks behind one side only/other side shows less weight shift Turn 360 Degrees: Able to turn 360 degrees safely but slowly Standing Unsupported, Alternately Place Feet on Step/Stool: Able to stand independently and safely and complete 8 steps in 20 seconds Standing Unsupported, One Foot in Front: Loses balance while stepping or standing Standing on One Leg: Able to lift leg independently and hold 5-10 seconds Total Score: 44 A score less than 45 indicates a greater risk of falling   TODAY'S TREATMENT:  DATE:  04/14/23: Assessed balance with BERG - see above, and SLS NuStep level 1 x 8 min with seat at 11 - o2 99 and HR 91 at end  Standing in // bars:  Standing hip flexion, abduction, extension x 10 reps each with frequent v/c to keep knee straight throughout Mini squats with v/c for correct form x 10 Heel raises x 10 Toe raises x 10 Tandem stance x 1 min bilaterally with no loss of balance  04/09/23 Eval performed: encouraged walking and sit to stand at home  PATIENT EDUCATION:  Education details: POC, walking, sit to stand Person educated: Patient Education method: Explanation Education comprehension: verbalized understanding  HOME EXERCISE PROGRAM: Walking time, sit to stand Standing hip extension, flexion and abduction x 10 reps each once per day  ASSESSMENT:  CLINICAL IMPRESSION: Assessed pt's balance with the BERG today and he scored a 44 - anything under 45 is a greater risk of falling. Also timed his SLS. Added goal to address balance. Began strengthening today including NuStep and standing exercises as well as balance. Pt did very well with few seated recovery periods. Did not add any weight yet to assess how he does after this session. Will progress as indicated.   OBJECTIVE IMPAIRMENTS: cardiopulmonary status limiting activity, decreased activity tolerance, decreased endurance, and decreased strength.   ACTIVITY LIMITATIONS: carrying, lifting, standing, stairs, bathing, and self feeding  PARTICIPATION LIMITATIONS: meal prep, cleaning, laundry, community activity, and occupation  REHAB POTENTIAL: Excellent  CLINICAL DECISION MAKING: Evolving/moderate complexity  EVALUATION COMPLEXITY: Moderate   GOALS: Goals reviewed with patient? Yes  SHORT TERM GOALS: Target date: 04/20/23  Pt will start on a home walking program to increase  stamina and endurance Baseline: Goal status: INITIAL   LONG TERM GOALS: Target date: 06/04/23  Pt will increase walk time to walking a full distance without rest at least 1260ft Baseline:  Goal status: INITIAL  2.  Pt will improve 5x sit to stand to <10 seconds Baseline:  Goal status: INITIAL  3.  Pt will transition to HEP at the gym  Baseline:  Goal status: INITIAL  4. Pt will improve LEFS to 34/80 to demonstrate improved functioning  5. Pt will demonstrate a score of 56 on BERG balance to decrease fall risk.  Baseline: 44 Goal Status: INITIAL     PLAN:  PT FREQUENCY: 2x/week  PT DURATION: 8 weeks  PLANNED INTERVENTIONS: Therapeutic exercises, Therapeutic activity, Neuromuscular re-education, Balance training, Gait training, Patient/Family education, Self Care, and Re-evaluation  PLAN FOR NEXT SESSION: nustep, gait, assess balance, start general TE - no mets or precautions   Milagros Loll Blue, PT 04/14/2023, 11:01 AM

## 2023-04-14 NOTE — Telephone Encounter (Signed)
PET/CT results discussed in detail with the patient and his brother. No evidence of residual disease.  Discussed that since he has relatively bulky disease and involvement of the skin and potentially this is touching the clavicle we would refer him to radiation oncology to discuss any potential role for involved site radiation to reduce risk of local recurrence.  He is agreeable to this.  We will plan to see him back in 2 months with port flush and labs and at that time we will determine removal of port. Plan to repeat CT chest abdomen pelvis and neck in 6 months unless new symptoms arise.  Logan Maine MD MS

## 2023-04-15 ENCOUNTER — Telehealth: Payer: Self-pay | Admitting: Radiation Oncology

## 2023-04-15 NOTE — Telephone Encounter (Signed)
Received message of missed call from pt. Returned call and LVM.

## 2023-04-15 NOTE — Telephone Encounter (Signed)
LVM to schedule CON with Dr.Squire 

## 2023-04-15 NOTE — Progress Notes (Signed)
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 Mild non-suppurative nodal enlargement, likely reactive.  CT SOFT TISSUE NECK W CONTRAST 11-04-22 IMPRESSION: 1. Marked interval worsening in the degree of soft tissue abnormality seen in the right neck compared to prior exam dated 07/23/2022. There is now a multi spatial infiltrative soft tissue mass involving a large portion of the right neck. Inferiorly this mass extends into the mediastinum and superiorly this mass extends to the submandibular region. Infiltrative soft tissue is also seen along the left supraclavicular region. Portions of this mass appear to have a cutaneous component, for example along the right aspect of the chin, as well as the right sternoclavicular joint. Findings are concerning for malignancy, with differential considerations including lymphoma or sarcoma. Tissue sampling is recommended. Additionally CT of the chest is also recommended to fully characterize the mediastinal extent of this mass. 2. 2.8 x 1.8 cm lesion with central hypodensity along the inferior aspect of the right submandibular gland could represent a centrally necrotic lymph node.   NM PET Image Restag (PS) Skull Base To Thigh 04-04-23 IMPRESSION: No evidence of recurrent lymphoma ([place] 1). Residual minimally hypermetabolic soft tissue thickening anterior to the medial right clavicle ([place] 3).  Logan Ward presented with symptoms of: (Per Dr. Clyda Greener note on 03-31-23) Patient reports he has been doing well overall since he got discharged from hospital. He complains of bilateral leg/hand swelling,  dizziness, diarrhea, and bilateral leg weakness. He reports that his dizziness is worse at night when he closes his eyes. Patient also complains of hyperpigmentation on his tongue and nails. He does note that he has been eating well and staying well hydrated.   Biopsies revealed:  11-12-22 Clinical History: right neck mass (cm) FINAL MICROSCOPIC DIAGNOSIS:  A. LYMPH NODE, RIGHT NECK, NEEDLE CORE BIOPSY: -  Consistent with a B-cell lymphoma with high-grade features, see note  Note: The needle core biopsies consist predominantly of geographic tumoral type necrosis.  Focally there are sheets of moderate-sized CD20/PAX5 positive lymphoid cells that have a high apoptotic/mitotic index with a "starry sky" appearance.  Additionally, while there is limited material the cells of interest are positive for CD10 and BCL6 and negative for both Bcl-2 and MUM1.  CD3/CD5 highlights background T cells CD138, CD30, cyclin D1 and EBV ISH are negative.  In the areas of viability the proliferation rate is greater than 90%.  While the evaluation is limited due to the extensive necrosis the overall findings are consistent with a B-cell lymphoma and most consistent with a Burkitt lymphoma.  FISH testing for confirmatory purposes will be attempted; however, given the extensive necrosis there may be insufficient material.   11-18-22 DIAGNOSIS:  BONE MARROW, ASPIRATE, CLOT, CORE:  -Normocellular bone marrow for age with trilineage hematopoiesis  -See comment   PERIPHERAL BLOOD:  -Mild normocytic-normochromic anemia  -Mild neutrophilic left shift   COMMENT:   There is no definitive or diagnostic evidence of a B-cell  lymphoproliferative process in this material.  Correlation with  cytogenetic studies is recommended.   12-12-22 Clinical History: Burkitt lymphoma . HIV positive  Specimen Submitted:  A. CEREBRAL SPINAL FLUID, SPINAL TAP:   FINAL MICROSCOPIC DIAGNOSIS:  - No malignant cells identified  11-27-22    Past/Anticipated interventions by medical oncology, if any:  Dr. Candise Che on 03-31-23 DIAGNOSIS: Recently diagnosed Burkitt's lymphoma in patient with HIV/AIDs.  The patient presented with a rapidly growing right cervical nodal mass extending into the level 2 nodal station of the right supraclavicular nodal station and extension into the superficial right sternocleidomastoid muscle.  There is also hypermetabolic right axillary nodal disease.  No evidence of spleen or solid organ involvement.  No evidence of marrow involvement.  The patient was diagnosed in November/December 2023   ASSESSMENT/PLAN:    42 year old male with HIV/AIDS with newly diagnosed Burkitt's lymphoma   #1  Stage II recently diagnosed Burkitt's lymphoma Presenting with rapidly growing right neck mass extending into the chest, bilateral x-ray lymphadenopathy and also concern for possible involvement of retroperitoneal lymph nodes. Concerning for at least stage II possibly stage III disease. Some borderline lymphadenopathy in the abdomen could also be from his recently diagnosed HIV/AIDS. No clinical symptoms suggestive of CNS involvement at this time. Brain MRI negative.  No constitutional symptoms. No significant cytopenias to suggest bone marrow involvement. Discussed with patient and he is not interested in fertility preservation or sperm banking. First presented in August at [facility] and was treated empirically with steroids and antibiotics with improvement in swelling prior to additional progression. -Currently on dose adjusted EPOCH-R. Status post 1 cycle. Tolerated well overall.  -Scheduled for cycle #2 on 12/10/22. Will help facilitate inpatient admission. -Outpatient follow up on 12/17/22 for Rituxan and Neulasta   #2 HIV/AIDS -Following Dr. Carver Fila from ID and currently taking Biktarvy.   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  Patient reports this was completely treated.   #4  Prophylaxis: Continue salt water and baking soda mouthwash rinses 4 times a day to reduce mucositis.  MiraLAX and senna as for constipation Bactrim for prophylactic PJP.    #6:Decreased appetite, nausea, belching, and weight loss: Patient has lost weight since last being seen.  Drug to drug interactions noted with PPIs methotrexate.  Patient encouraged to try Pepcid and take nausea medicine prior to eating.  I sent a prescription for Compazine to alternate with Zofran if needed for nausea.  I will refer the patient to member the nutritionist team.  Patient Dors is some sensation of feeling like food is getting stuck in his esophagus.  I offered referral to GI.  The patient declined at this time.  Patient was open to trying Remeron which is an antidepressant but it can help patient's with insomnia and decreased appetite.  I sent a prescription for this to the patient's pharmacy.   #7: Thrombocytopenia: Platelets have normalized during previous treatment but with nadir down to 71k     PLAN: -Discussed lab results from today, 03/31/2023, with the patient. CBC shows slightly decreased WBC of 3.1 K, decreased hemoglobin of 9.2, decreased hematocrit of 30.7, and decreased platelets of 144 K. CMP is stable overall. CBC shows improvement.  -Patient's PET scan is scheduled on 04/04/2023. Treatment plan according to the PET scan results.  -Recommended to exercise, at least brisk walking for 30 minutes, and drinking about 2L of water.  -Recommended cancer center rehab to regain physical strength. Patient agrees with is plan.  -Referral to cancer rehab.  -Answered all of patient and his family member's questions.  -Continue acyclovir until Mid-May (2 months after the patient's last treatment). -Continue to eat healthy, avoid salty food.  -Will send referral to radiation oncologist.  -Educated the  patient that hyperpigmentation is due to chemotherapy, which will improve.  -Patient does not need blood  transfusion during this visit.  -Recommended using compression socks to reduce leg swelling.    FOLLOW-UP: Pending PET/Ct results  Weight changes, if any, over the past 6 months: yes, weight has changed off and on  Recurrent fevers, or drenching night sweats, if any: no  SAFETY ISSUES: Prior radiation? no Pacemaker/ICD? no Possible current pregnancy? na Is the patient on methotrexate? no  Current Complaints / other details:  Pt had port placed on 11-18-22 Pt had no major concerns or questions at this time. Pt denied pain at this time.   11-12-22 Interventional Radiology Procedure Note   Procedure: US guided biopsy of right neck mass   Indication: Right neck mass

## 2023-04-16 ENCOUNTER — Ambulatory Visit: Payer: 59 | Attending: Hematology | Admitting: Physical Therapy

## 2023-04-16 DIAGNOSIS — M6281 Muscle weakness (generalized): Secondary | ICD-10-CM | POA: Insufficient documentation

## 2023-04-16 DIAGNOSIS — C8378 Burkitt lymphoma, lymph nodes of multiple sites: Secondary | ICD-10-CM | POA: Insufficient documentation

## 2023-04-18 ENCOUNTER — Other Ambulatory Visit: Payer: Self-pay

## 2023-04-21 ENCOUNTER — Encounter: Payer: Self-pay | Admitting: Rehabilitation

## 2023-04-21 ENCOUNTER — Telehealth: Payer: Self-pay

## 2023-04-21 ENCOUNTER — Ambulatory Visit: Payer: 59 | Admitting: Rehabilitation

## 2023-04-21 ENCOUNTER — Encounter: Payer: Self-pay | Admitting: Radiation Oncology

## 2023-04-21 DIAGNOSIS — M6281 Muscle weakness (generalized): Secondary | ICD-10-CM

## 2023-04-21 DIAGNOSIS — C8378 Burkitt lymphoma, lymph nodes of multiple sites: Secondary | ICD-10-CM

## 2023-04-21 NOTE — Therapy (Signed)
OUTPATIENT PHYSICAL THERAPY LOWER EXTREMITY ONCOLOGY TREAMTMENT  Patient Name: Logan Ward MRN: [id number] DOB:Dec 03, 1981, 42 y.o., male Today's Date: 04/21/2023  END OF SESSION:  PT End of Session - 04/21/23 0958     Visit Number 3    Number of Visits 13    Date for PT Re-Evaluation 06/04/23    PT Start Time 1000    PT Stop Time 1048    PT Time Calculation (min) 48 min    Activity Tolerance Patient tolerated treatment well    Behavior During Therapy WFL for tasks assessed/performed             Past Medical History:  Diagnosis Date   Anal fissure    HIV infection (HCC)    Internal hemorrhoids    Obesity    Rectal ulcer    Past Surgical History:  Procedure Laterality Date   IR IMAGING GUIDED PORT INSERTION  11/18/2022   Patient Active Problem List   Diagnosis Date Noted   Intractable back pain suspect Granix-related 02/21/2023   Lactic acidosis 02/21/2023   Thrombocytopenia (HCC) 02/01/2023   Hyponatremia 01/31/2023   Hypokalemia 01/23/2023   Hematuria 01/11/2023   Neutropenic fever (HCC) 01/10/2023   Pancytopenia (HCC) 01/10/2023   Acute cystitis 01/10/2023   Anemia 01/01/2023   Encounter for chemotherapy management 12/12/2022   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HCC) 12/10/2022   Decreased appetite 12/02/2022   Drug-induced constipation 11/21/2022   Nausea without vomiting 11/21/2022   Acute nonintractable headache 11/21/2022   Encounter for antineoplastic chemotherapy 11/18/2022   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 I don't have to do the radiation.   PERTINENT HISTORY: HIV/AIDS now with burkitt's lymphoma resulting  in right neck and upper chest mass with numerous Rt cervical nodes enlarged and bil axillary nodes, no mets, EPOCH-R chemotherapy started 11/18/22 (etoposide phosphate, Oncovin, cyclophosphamide, and doxorubicin hydrochloride (hydroxydaunomycin), the targeted therapy drug rituximab, and the steroid hormone prednisone). And ended 03/10/23. Post treatment PET showing no residual lymphoma. Chemo was performed inpatient chemo.   PAIN:  Are you having pain? No  PRECAUTIONS: could have low blood counts  WEIGHT BEARING RESTRICTIONS: No  FALLS:  Has patient fallen in last 6 months? 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    LEISURE: tennis   HAND DOMINANCE: right   PRIOR LEVEL OF FUNCTION: Independent  PATIENT GOALS: get back to normal function    OBJECTIVE:  COGNITION:  Overall cognitive status: Within functional limits for tasks assessed   SENSATION: Intact darco foot sensation testing   LE ROM:   LE MMT:   MMT Right Eval Left Eval  Hip flexion Able to lift SLR around 30deg Able to lift SLR around 35deg  Hip extension    Hip abduction 4+ 4+  Hip adduction     Hip internal rotation    Hip external rotation    Knee flexion 4 4  Knee extension 4 4  Ankle dorsiflexion 5 5  Ankle plantarflexion    Ankle inversion    Ankle eversion     (Blank rows = not tested)  FUNCTIONAL TESTS:  5 times sit to stand: 19 second using hands: is able to do sit to stand x 1 without hands. >15seconds predicts recurrent falls.    6 minute walk test: 32ft 39min41sec 5/10 RPE. Starting vitals: HR 72, O2 98%, ending vitals HR 86, O2 99%  MCTSIB:   LEFS: 30% functioning  BALNCE TESTING:  04/14/23: SLS on R 18 sec on L 7 sec     A score less than 45 indicates a greater risk of falling   TODAY'S TREATMENT:                                                                                                                                         DATE:  04/21/23:  NuStep level 1 x 9 min with seat at 11 o2 98% x 550 steps  SLR 2x5 Bridge with ball 2x5 Sit to stand x10 (feels like he couldn't do 1 more)  Wall push up 2x6 In parallel bars: on balance beam tandem walk x 4 lengths and side steps x 4 lengths (easy) Tandem stance on foam 2xmax bil Attempted red band 3 way hip but too much so went back to no band 2x5bil Row red x 10  04/14/23: Assessed balance with BERG - see above, and SLS  NuStep level 1 x 8 min with seat at 11 - o2 99 and HR 91 at end  Standing in // bars:  Standing hip flexion, abduction, extension x 10 reps each with frequent v/c to keep knee straight throughout Mini squats with v/c for correct form x 10 Heel raises x 10 Toe raises x 10 Tandem stance x 1 min bilaterally with no loss of balance  04/09/23 Eval performed: encouraged walking and sit to stand at home  PATIENT EDUCATION:  Education details: POC, walking, sit to stand Person educated: Patient Education method: Explanation Education comprehension: verbalized understanding  HOME EXERCISE PROGRAM: Walking time, sit to stand Standing hip extension, flexion and abduction x 10 reps each  once per day  ASSESSMENT:  CLINICAL IMPRESSION:  5/10 RPE.  Tolerated it all well except for theraband hip band exercises.   OBJECTIVE IMPAIRMENTS: cardiopulmonary status limiting activity, decreased activity tolerance, decreased endurance, and decreased strength.   ACTIVITY LIMITATIONS: carrying, lifting, standing, stairs, bathing, and self feeding  PARTICIPATION LIMITATIONS: meal prep, cleaning, laundry, community activity, and occupation  REHAB POTENTIAL: Excellent  CLINICAL DECISION MAKING: Evolving/moderate complexity  EVALUATION COMPLEXITY: Moderate   GOALS: Goals reviewed with patient? Yes  SHORT TERM GOALS: Target date: 04/20/23  Pt will start on a home walking program to increase stamina and endurance Baseline: Goal status: INITIAL   LONG TERM GOALS: Target date: 06/04/23  Pt will increase walk time to walking a full distance without rest at least 1226ft Baseline:  Goal status: INITIAL  2.  Pt will improve 5x sit to stand to <10 seconds Baseline:  Goal status: INITIAL  3.  Pt will transition to HEP at the gym  Baseline:  Goal status: INITIAL  4. Pt will improve LEFS to 34/80 to demonstrate improved functioning  5. Pt will demonstrate a score of 56 on BERG balance to decrease fall risk.  Baseline: 44 Goal Status: INITIAL     PLAN:  PT FREQUENCY: 2x/week  PT DURATION: 8 weeks  PLANNED INTERVENTIONS: Therapeutic exercises, Therapeutic activity, Neuromuscular re-education, Balance training, Gait training, Patient/Family education, Self Care, and Re-evaluation  PLAN FOR NEXT SESSION: nustep, gait, assess balance, start general TE - no mets or precautions   Idamae Lusher, PT 04/21/2023, 10:51 AM

## 2023-04-21 NOTE — Telephone Encounter (Signed)
Rn called to obtain meaningful use and nurse evaluation information. Consult note routed to Dr. Basilio Cairo and Quitman Livings PA for review. Overall pt doing well with no major concerns or questions.

## 2023-04-21 NOTE — Progress Notes (Signed)
Radiation Oncology         [phone] ________________________________  Initial Outpatient Consultation  Name: Logan Ward MRN: [id number]  Date: 04/22/2023  DOB: [birth year]  CC:No primary care provider on file.  Johney Maine, MD   REFERRING PHYSICIAN: Johney Maine, MD  DIAGNOSIS:    ICD-10-CM   1. 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 male who presented with ***.  Subsequently, the patient saw Dr. [name] who ***.  Biopsy of *** on *** revealed: ***  Pertinent imaging thus far includes *** performed on *** revealing ***.   ***  Swallowing issues, if any: ***  Weight Changes: ***  Pain status: ***  Other symptoms: ***  Tobacco history, if any: ***  ETOH abuse, if any: ***  Prior cancers, if any: ***  PREVIOUS RADIATION THERAPY: {EXAM; YES/NO:19492::"No"}  PAST MEDICAL HISTORY:  has a past medical history of Anal fissure, HIV infection (HCC), Internal hemorrhoids, Obesity, and Rectal ulcer.    PAST SURGICAL HISTORY: Past Surgical History:  Procedure Laterality Date   IR IMAGING GUIDED PORT INSERTION  11/18/2022    FAMILY HISTORY: family history includes Heart attack in his brother; Hypertension in his father; Throat cancer in his father.  SOCIAL HISTORY:  reports that he has never smoked. He has never used smokeless tobacco. He reports that he does not drink alcohol and does not use drugs.  ALLERGIES: Patient has no known allergies.  MEDICATIONS:  Current Outpatient Medications  Medication Sig Dispense Refill   acyclovir (ZOVIRAX) 400 MG tablet Take 1 tablet (400 mg total) by mouth 2 (two) times daily. 30 tablet 0   B Complex-C (B-COMPLEX WITH VITAMIN C) tablet Take 1 tablet by mouth daily. 30 tablet 11   bictegravir-emtricitabine-tenofovir AF  (BIKTARVY) 50-200-25 MG TABS tablet Take 1 tablet by mouth daily. 30 tablet 5   dexamethasone (DECADRON) 4 MG tablet Take 1 tablet (4mg ) daily for 2 day after treatment and then 1/2 tablet (2mg ) by mouth daily for 10 days. 30 tablet 0   levofloxacin (LEVAQUIN) 500 MG tablet Take 1 tablet (500 mg total) by mouth daily. Starting taking day after Rituxan infusion 10 tablet 0   LORazepam (ATIVAN) 0.5 MG tablet Take 0.5 mg by mouth every 8 (eight) hours as needed for anxiety.     senna-docusate (SENOKOT-S) 8.6-50 MG tablet Take 2 tablets by mouth at bedtime as needed for mild constipation. 60 tablet 0   sulfamethoxazole-trimethoprim (BACTRIM DS) 800-160 MG tablet Take 1 tablet by mouth daily. 30 tablet 1   No current facility-administered medications for this encounter.    REVIEW OF SYSTEMS:  Notable for that above.   PHYSICAL EXAM:  vitals were not taken for this visit.   General: Alert and oriented, in no acute distress HEENT: Head is normocephalic. Extraocular movements are intact. Oropharynx is notable for ***. Neck: Neck is notable for *** Heart: Regular in rate and rhythm with no murmurs, rubs, or gallops. Chest: Clear to auscultation bilaterally, with no rhonchi, wheezes, or rales. Abdomen: Soft, nontender, nondistended, with no rigidity or guarding. Extremities: No cyanosis or edema. Lymphatics: see Neck Exam Skin: No concerning lesions. Musculoskeletal: symmetric strength and muscle tone throughout. Neurologic: Cranial nerves II through XII are grossly intact. No obvious focalities. Speech is fluent. Coordination is  intact. Psychiatric: Judgment and insight are intact. Affect is appropriate.   ECOG = ***  0 - Asymptomatic (Fully active, able to carry on all predisease activities without restriction)  1 - Symptomatic but completely ambulatory (Restricted in physically strenuous activity but ambulatory and able to carry out work of a light or sedentary nature. For example, light  housework, office work)  2 - Symptomatic, <50% in bed during the day (Ambulatory and capable of all self care but unable to carry out any work activities. Up and about more than 50% of waking hours)  3 - Symptomatic, >50% in bed, but not bedbound (Capable of only limited self-care, confined to bed or chair 50% or more of waking hours)  4 - Bedbound (Completely disabled. Cannot carry on any self-care. Totally confined to bed or chair)  5 - Death   Santiago Glad MM, Creech RH, Tormey DC, et al. [phone]). "Toxicity and response criteria of the [facility]". Am. Evlyn Clines. Oncol. 5 (6): 649-55   LABORATORY DATA:  Lab Results  Component Value Date   WBC 3.1 (L) 03/31/2023   HGB 9.2 (L) 03/31/2023   HCT 30.7 (L) 03/31/2023   MCV 97.2 03/31/2023   PLT 144 (L) 03/31/2023   CMP     Component Value Date/Time   NA 138 03/31/2023 0911   K 3.7 03/31/2023 0911   CL 106 03/31/2023 0911   CO2 25 03/31/2023 0911   GLUCOSE 109 (H) 03/31/2023 0911   BUN 18 03/31/2023 0911   CREATININE 0.96 03/31/2023 0911   CREATININE 1.05 10/21/2022 1535   CALCIUM 8.6 (L) 03/31/2023 0911   PROT 5.8 (L) 03/31/2023 0911   ALBUMIN 3.5 03/31/2023 0911   AST 15 03/31/2023 0911   ALT 31 03/31/2023 0911   ALKPHOS 51 03/31/2023 0911   BILITOT 0.8 03/31/2023 0911   GFRNONAA >60 03/31/2023 0911   GFRAA >60 12/03/2017 0235      Lab Results  Component Value Date   TSH 1.231 01/12/2023     RADIOGRAPHY: NM PET Image Restag (PS) Skull Base To Thigh  Result Date: 04/07/2023 CLINICAL DATA:  Subsequent treatment strategy for lymphoma. EXAM: NUCLEAR MEDICINE PET SKULL BASE TO THIGH TECHNIQUE: 13.8 mCi F-18 FDG was injected intravenously. Full-ring PET imaging was performed from the skull base to thigh after the radiotracer. CT data was obtained and used for attenuation correction and anatomic localization. Fasting blood glucose: 100 mg/dl COMPARISON:  CT abdomen pelvis 02/21/2023, PET 01/17/2023. FINDINGS:  Mediastinal blood pool activity: SUV max 2.6 Liver activity: SUV max 4.4 NECK: No abnormal hypermetabolism. Incidental CT findings: None. CHEST: Similar soft tissue thickening anterior to the medial right clavicle (4/43), SUV max 3.2, stable. No additional abnormal hypermetabolism. Incidental CT findings: Left IJ Port-A-Cath terminates at the SVC RA junction. Heart is enlarged. No pericardial or pleural effusion. ABDOMEN/PELVIS: No abnormal hypermetabolism. Incidental CT findings: Liver, gallbladder, adrenal glands, kidneys, spleen, pancreas, stomach and bowel are grossly unremarkable. SKELETON: No abnormal hypermetabolism. Incidental CT findings: None. IMPRESSION: No evidence of recurrent lymphoma ([place] 1). Residual minimally hypermetabolic soft tissue thickening anterior to the medial right clavicle ([place] 3). Electronically Signed   By: Leanna Battles M.D.   On: 04/07/2023 13:02      IMPRESSION/PLAN:  This is a delightful patient with head and neck cancer. I *** recommend radiotherapy for this patient.  We discussed the potential risks, benefits, and side effects of radiotherapy. We talked in detail about acute and late effects. We discussed that some of  the most bothersome acute effects may be mucositis, dysgeusia, salivary changes, skin irritation, hair loss, dehydration, weight loss and fatigue. We talked about late effects which include but are not necessarily limited to dysphagia, hypothyroidism, nerve injury, vascular injury, spinal cord injury, xerostomia, trismus, neck edema, and potential injury to any of the tissues in the head and neck region. No guarantees of treatment were given. A consent form was signed and placed in the patient's medical record. The patient is enthusiastic about proceeding with treatment. I look forward to participating in the patient's care.    Simulation (treatment planning) will take place ***  We also discussed that the treatment of head and neck cancer is  a multidisciplinary process to maximize treatment outcomes and quality of life. For this reason the following referrals have been or will be made:  *** Medical oncology to discuss chemotherapy   *** Dentistry for dental evaluation, possible extractions in the radiation fields, and /or advice on reducing risk of cavities, osteoradionecrosis, or other oral issues.  *** Nutritionist for nutrition support during and after treatment.  *** Speech language pathology for swallowing and/or speech therapy.  *** Social work for social support.   *** Physical therapy due to risk of lymphedema in neck and deconditioning.  *** Baseline labs including TSH.  On date of service, in total, I spent *** minutes on this encounter. Patient was seen in person.  __________________________________________   Lonie Peak, MD  This document serves as a record of services personally performed by Lonie Peak, MD. It was created on her behalf by Neena Rhymes, a trained medical scribe. The creation of this record is based on the scribe's personal observations and the provider's statements to them. This document has been checked and approved by the attending provider.

## 2023-04-22 ENCOUNTER — Ambulatory Visit
Admission: RE | Admit: 2023-04-22 | Discharge: 2023-04-22 | Disposition: A | Discharge status: Home / Self Care | Payer: 59 | Source: Ambulatory Visit | Attending: Radiation Oncology | Admitting: Radiation Oncology

## 2023-04-22 ENCOUNTER — Encounter: Payer: Self-pay | Admitting: Radiation Oncology

## 2023-04-22 VITALS — BP 103/61 | HR 80 | Temp 98.7°F | Resp 20 | Wt 293.5 lb

## 2023-04-22 DIAGNOSIS — B20 Human immunodeficiency virus [HIV] disease: Secondary | ICD-10-CM | POA: Insufficient documentation

## 2023-04-22 DIAGNOSIS — C8378 Burkitt lymphoma, lymph nodes of multiple sites: Secondary | ICD-10-CM

## 2023-04-22 DIAGNOSIS — Z801 Family history of malignant neoplasm of trachea, bronchus and lung: Secondary | ICD-10-CM | POA: Insufficient documentation

## 2023-04-22 DIAGNOSIS — Z8719 Personal history of other diseases of the digestive system: Secondary | ICD-10-CM | POA: Diagnosis not present

## 2023-04-22 DIAGNOSIS — Z7952 Long term (current) use of systemic steroids: Secondary | ICD-10-CM | POA: Insufficient documentation

## 2023-04-22 DIAGNOSIS — Z79899 Other long term (current) drug therapy: Secondary | ICD-10-CM | POA: Diagnosis not present

## 2023-04-22 DIAGNOSIS — Z79624 Long term (current) use of inhibitors of nucleotide synthesis: Secondary | ICD-10-CM | POA: Insufficient documentation

## 2023-04-22 DIAGNOSIS — E669 Obesity, unspecified: Secondary | ICD-10-CM | POA: Diagnosis not present

## 2023-04-22 DIAGNOSIS — Z79620 Long term (current) use of immunosuppressive biologic: Secondary | ICD-10-CM | POA: Diagnosis not present

## 2023-04-23 ENCOUNTER — Ambulatory Visit: Payer: 59

## 2023-04-23 DIAGNOSIS — C8378 Burkitt lymphoma, lymph nodes of multiple sites: Secondary | ICD-10-CM

## 2023-04-23 DIAGNOSIS — M6281 Muscle weakness (generalized): Secondary | ICD-10-CM | POA: Diagnosis not present

## 2023-04-23 NOTE — Progress Notes (Signed)
Oncology Nurse Navigator Documentation   I met with Mr. Camelo before his consult with Dr. Basilio Cairo to discuss possible radiation on 04/22/27. I provided him with my direct contact information and encouraged him to call me with any questions at any time.   Per Dr. Colletta Maryland consult note he will not receive radiation at this time and Dr. Candise Che has been notified.   Hedda Slade RN, BSN, OCN Head & Neck Oncology Nurse Navigator [facility] at [facility] Phone # [phone]  Fax # [phone]

## 2023-04-23 NOTE — Therapy (Signed)
OUTPATIENT PHYSICAL THERAPY LOWER EXTREMITY ONCOLOGY TREAMTMENT  Patient Name: Logan Ward MRN: [id number] DOB:02-13-1981, 42 y.o., male Today's Date: 04/23/2023  END OF SESSION:  PT End of Session - 04/23/23 1022     Visit Number 4    Number of Visits 13    Date for PT Re-Evaluation 06/04/23    Authorization Type none needed    PT Start Time 1019   pt arrived late   PT Stop Time 1101    PT Time Calculation (min) 42 min    Activity Tolerance Patient tolerated treatment well    Behavior During Therapy WFL for tasks assessed/performed             Past Medical History:  Diagnosis Date   Anal fissure    HIV infection (HCC)    Internal hemorrhoids    Obesity    Rectal ulcer    Past Surgical History:  Procedure Laterality Date   IR IMAGING GUIDED PORT INSERTION  11/18/2022   Patient Active Problem List   Diagnosis Date Noted   Intractable back pain suspect Granix-related 02/21/2023   Lactic acidosis 02/21/2023   Thrombocytopenia (HCC) 02/01/2023   Hyponatremia 01/31/2023   Hypokalemia 01/23/2023   Hematuria 01/11/2023   Neutropenic fever (HCC) 01/10/2023   Pancytopenia (HCC) 01/10/2023   Acute cystitis 01/10/2023   Anemia 01/01/2023   Encounter for chemotherapy management 12/12/2022   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HCC) 12/10/2022   Decreased appetite 12/02/2022   Drug-induced constipation 11/21/2022   Nausea without vomiting 11/21/2022   Acute nonintractable headache 11/21/2022   Encounter for antineoplastic chemotherapy 11/18/2022   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 I feel pretty good overall today from doing that, my calves are just sore.   PERTINENT HISTORY: HIV/AIDS now with burkitt's lymphoma resulting in right neck and upper chest mass with numerous Rt cervical nodes enlarged and bil axillary nodes, no mets, EPOCH-R chemotherapy started 11/18/22 (etoposide phosphate, Oncovin, cyclophosphamide, and doxorubicin hydrochloride (hydroxydaunomycin), the targeted therapy drug rituximab, and the steroid hormone prednisone). And ended 03/10/23. Post treatment PET showing no residual lymphoma. Chemo was performed inpatient chemo.   PAIN:  Are you having pain? No  PRECAUTIONS: could have low blood counts  WEIGHT BEARING RESTRICTIONS: No  FALLS:  Has patient fallen in last 6 months? 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    LEISURE: tennis   HAND DOMINANCE: right   PRIOR LEVEL OF FUNCTION: Independent  PATIENT GOALS: get back to normal function    OBJECTIVE:  COGNITION:  Overall cognitive status: Within functional limits for tasks assessed   SENSATION: Intact darco foot sensation testing   LE ROM:   LE MMT:   MMT  Right Eval Left Eval  Hip flexion Able to lift SLR around 30deg Able to lift SLR around 35deg  Hip extension    Hip abduction 4+ 4+  Hip adduction    Hip internal rotation    Hip external rotation    Knee flexion 4 4  Knee extension 4 4  Ankle dorsiflexion 5 5  Ankle plantarflexion    Ankle inversion    Ankle eversion     (Blank rows = not tested)  FUNCTIONAL TESTS:  5 times sit to stand: 19 second using hands: is able to do sit to stand x 1 without hands. >15seconds predicts recurrent falls.    6 minute walk test: 389ft 67min41sec 5/10 RPE. Starting vitals: HR 72, O2 98%, ending vitals HR 86, O2 99%  MCTSIB:   LEFS: 30% functioning  BALNCE TESTING:  04/14/23: SLS on R 18 sec on L 7 sec     A score less than 45 indicates a greater risk of falling   TODAY'S TREATMENT:                                                                                                                                         DATE:  04/23/23: NuStep Level 2, x 8 mins seat 13 and UE 10, SpO2 99% after Runners pose for calf stretch 2x, 60 sec each leg In // bars: Heel-toe walking front and retro 4x each with no UE support; grapevine walking 3x each way, high knee marching with 3 sec SLS each step 3x; then SLS on blue oval and 2# each ankle for  bil hip 3 way raises 2 x 10 returning therapist demo and rest between sets Free Motion Machine 7#, holding handles stable with elbows bent at sides and forward walking 2 x 5 with SBA during   04/21/23:  NuStep level 1 x 9 min with seat at 11 o2 98% x 550 steps  SLR 2x5 Bridge with ball 2x5 Sit to stand x10 (feels like he couldn't do 1 more)  Wall push up 2x6 In parallel bars: on balance beam tandem walk x 4 lengths and side steps x 4 lengths (easy) Tandem stance on foam 2xmax bil Attempted red band 3 way hip but too much so went back to no band 2x5bil Row red x 10  04/14/23: Assessed balance with BERG - see above, and SLS NuStep level 1 x 8 min with seat at  11 - o2 99 and HR 91 at end  Standing in // bars:  Standing hip flexion, abduction, extension x 10 reps each with frequent v/c to keep knee straight throughout Mini squats with v/c for correct form x 10 Heel raises x 10 Toe raises x 10 Tandem stance x 1 min bilaterally with no loss of balance  04/09/23 Eval performed: encouraged walking and sit to stand at home  PATIENT EDUCATION:  Education details: POC, walking, sit to stand Person educated: Patient Education method: Explanation Education comprehension: verbalized understanding  HOME EXERCISE PROGRAM: Walking time, sit to stand Standing hip extension, flexion and abduction x 10 reps each once per day  ASSESSMENT:  CLINICAL IMPRESSION:  RPE during session 3/10. Progressed pt with balance activities adding light weight/resistance. He was challenged by progressing activities and encouraged him to add low reps of step ups on his bottom step at home and pt verbalized understanding.   OBJECTIVE IMPAIRMENTS: cardiopulmonary status limiting activity, decreased activity tolerance, decreased endurance, and decreased strength.   ACTIVITY LIMITATIONS: carrying, lifting, standing, stairs, bathing, and self feeding  PARTICIPATION LIMITATIONS: meal prep, cleaning, laundry, community activity, and occupation  REHAB POTENTIAL: Excellent  CLINICAL DECISION MAKING: Evolving/moderate complexity  EVALUATION COMPLEXITY: Moderate   GOALS: Goals reviewed with patient? Yes  SHORT TERM GOALS: Target date: 04/20/23  Pt will start on a home walking program to increase stamina and endurance Baseline: Goal status: INITIAL   LONG TERM GOALS: Target date: 06/04/23  Pt will increase walk time to walking a full distance without rest at least 127ft Baseline:  Goal status: INITIAL  2.  Pt will improve 5x sit to stand to <10 seconds Baseline:  Goal status: INITIAL  3.  Pt will transition to HEP at the gym  Baseline:  Goal status:  INITIAL  4. Pt will improve LEFS to 34/80 to demonstrate improved functioning  5. Pt will demonstrate a score of 56 on BERG balance to decrease fall risk.  Baseline: 44 Goal Status: INITIAL     PLAN:  PT FREQUENCY: 2x/week  PT DURATION: 8 weeks  PLANNED INTERVENTIONS: Therapeutic exercises, Therapeutic activity, Neuromuscular re-education, Balance training, Gait training, Patient/Family education, Self Care, and Re-evaluation  PLAN FOR NEXT SESSION: nustep, gait, assess balance, start general TE - no mets or precautions   Hermenia Bers, PTA 04/23/2023, 11:02 AM

## 2023-04-24 ENCOUNTER — Other Ambulatory Visit (HOSPITAL_COMMUNITY): Payer: Self-pay

## 2023-04-25 ENCOUNTER — Telehealth: Payer: Self-pay | Admitting: *Deleted

## 2023-04-25 NOTE — Telephone Encounter (Signed)
Returned PC to patient, he called earlier stating he has a rash on both lower arms x 1 week, is raised, red, dry - denies any itching.  He also states he has a rash around his testicles.  Dr Candise Che informed - he advises that patient contact his PCP or infectious disease MD. Patient verbalizes understanding.

## 2023-04-28 ENCOUNTER — Ambulatory Visit: Payer: 59 | Admitting: Physical Therapy

## 2023-04-28 ENCOUNTER — Ambulatory Visit: Payer: 59

## 2023-04-28 ENCOUNTER — Ambulatory Visit: Payer: 59 | Admitting: Radiation Oncology

## 2023-04-30 ENCOUNTER — Ambulatory Visit: Payer: 59 | Admitting: Physical Therapy

## 2023-05-05 ENCOUNTER — Other Ambulatory Visit (HOSPITAL_COMMUNITY): Payer: Self-pay

## 2023-05-05 ENCOUNTER — Other Ambulatory Visit: Payer: Self-pay

## 2023-05-05 ENCOUNTER — Ambulatory Visit: Payer: 59 | Admitting: Rehabilitation

## 2023-05-05 ENCOUNTER — Encounter: Payer: Self-pay | Admitting: Rehabilitation

## 2023-05-07 ENCOUNTER — Telehealth: Payer: Self-pay | Admitting: Rehabilitation

## 2023-05-07 ENCOUNTER — Ambulatory Visit: Payer: 59 | Admitting: Rehabilitation

## 2023-05-07 NOTE — Telephone Encounter (Signed)
LVM regarding 2nd no show appt.

## 2023-05-08 ENCOUNTER — Other Ambulatory Visit: Payer: Self-pay

## 2023-05-08 ENCOUNTER — Other Ambulatory Visit (HOSPITAL_COMMUNITY): Payer: Self-pay

## 2023-05-09 ENCOUNTER — Other Ambulatory Visit (HOSPITAL_COMMUNITY): Payer: Self-pay

## 2023-05-10 ENCOUNTER — Encounter: Payer: Self-pay | Admitting: Hematology

## 2023-05-14 ENCOUNTER — Ambulatory Visit: Payer: 59 | Admitting: Rehabilitation

## 2023-05-14 ENCOUNTER — Encounter: Payer: Self-pay | Admitting: Rehabilitation

## 2023-05-16 ENCOUNTER — Encounter: Payer: 59 | Admitting: Rehabilitation

## 2023-05-19 ENCOUNTER — Encounter: Payer: 59 | Admitting: Rehabilitation

## 2023-05-21 ENCOUNTER — Encounter: Payer: 59 | Admitting: Rehabilitation

## 2023-06-03 ENCOUNTER — Other Ambulatory Visit (HOSPITAL_COMMUNITY): Payer: Self-pay

## 2023-06-03 ENCOUNTER — Other Ambulatory Visit: Payer: Self-pay | Admitting: Hematology

## 2023-06-03 DIAGNOSIS — B20 Human immunodeficiency virus [HIV] disease: Secondary | ICD-10-CM

## 2023-06-03 MED ORDER — SULFAMETHOXAZOLE-TRIMETHOPRIM 800-160 MG PO TABS
1.0000 | ORAL_TABLET | Freq: Every day | ORAL | 1 refills | Status: DC
Start: 2023-06-03 — End: 2023-08-20
  Filled 2023-06-03 – 2023-06-13 (×3): qty 30, 30d supply, fill #0
  Filled 2023-07-23: qty 30, 30d supply, fill #1

## 2023-06-10 ENCOUNTER — Other Ambulatory Visit (HOSPITAL_COMMUNITY): Payer: Self-pay

## 2023-06-12 ENCOUNTER — Other Ambulatory Visit: Payer: Self-pay

## 2023-06-12 DIAGNOSIS — C8378 Burkitt lymphoma, lymph nodes of multiple sites: Secondary | ICD-10-CM

## 2023-06-13 ENCOUNTER — Other Ambulatory Visit: Payer: Self-pay

## 2023-06-13 ENCOUNTER — Inpatient Hospital Stay: Payer: 59 | Attending: Hematology

## 2023-06-13 ENCOUNTER — Other Ambulatory Visit (HOSPITAL_COMMUNITY): Payer: Self-pay

## 2023-06-13 ENCOUNTER — Telehealth: Payer: Self-pay

## 2023-06-13 DIAGNOSIS — B20 Human immunodeficiency virus [HIV] disease: Secondary | ICD-10-CM | POA: Insufficient documentation

## 2023-06-13 DIAGNOSIS — C8378 Burkitt lymphoma, lymph nodes of multiple sites: Secondary | ICD-10-CM

## 2023-06-13 DIAGNOSIS — C8370 Burkitt lymphoma, unspecified site: Secondary | ICD-10-CM | POA: Diagnosis present

## 2023-06-13 LAB — CBC WITH DIFFERENTIAL (CANCER CENTER ONLY)
Abs Immature Granulocytes: 0 10*3/uL (ref 0.00–0.07)
Basophils Absolute: 0 10*3/uL (ref 0.0–0.1)
Basophils Relative: 0 %
Eosinophils Absolute: 0 10*3/uL (ref 0.0–0.5)
Eosinophils Relative: 1 %
HCT: 35.5 % — ABNORMAL LOW (ref 39.0–52.0)
Hemoglobin: 11.5 g/dL — ABNORMAL LOW (ref 13.0–17.0)
Immature Granulocytes: 0 %
Lymphocytes Relative: 69 %
Lymphs Abs: 1.6 10*3/uL (ref 0.7–4.0)
MCH: 27 pg (ref 26.0–34.0)
MCHC: 32.4 g/dL (ref 30.0–36.0)
MCV: 83.3 fL (ref 80.0–100.0)
Monocytes Absolute: 0.3 10*3/uL (ref 0.1–1.0)
Monocytes Relative: 12 %
Neutro Abs: 0.4 10*3/uL — CL (ref 1.7–7.7)
Neutrophils Relative %: 18 %
Platelet Count: 153 10*3/uL (ref 150–400)
RBC: 4.26 MIL/uL (ref 4.22–5.81)
RDW: 14.9 % (ref 11.5–15.5)
WBC Count: 2.3 10*3/uL — ABNORMAL LOW (ref 4.0–10.5)
nRBC: 0 % (ref 0.0–0.2)

## 2023-06-13 LAB — CMP (CANCER CENTER ONLY)
ALT: 20 U/L (ref 0–44)
AST: 24 U/L (ref 15–41)
Albumin: 4.1 g/dL (ref 3.5–5.0)
Alkaline Phosphatase: 120 U/L (ref 38–126)
Anion gap: 6 (ref 5–15)
BUN: 7 mg/dL (ref 6–20)
CO2: 28 mmol/L (ref 22–32)
Calcium: 9.1 mg/dL (ref 8.9–10.3)
Chloride: 106 mmol/L (ref 98–111)
Creatinine: 0.94 mg/dL (ref 0.61–1.24)
GFR, Estimated: >60 mL/min (ref >60)
Glucose, Bld: 89 mg/dL (ref 70–99)
Potassium: 3.8 mmol/L (ref 3.5–5.1)
Sodium: 140 mmol/L (ref 135–145)
Total Bilirubin: 1.4 mg/dL — ABNORMAL HIGH (ref 0.3–1.2)
Total Protein: 7 g/dL (ref 6.5–8.1)

## 2023-06-13 MED ORDER — SODIUM CHLORIDE 0.9% FLUSH
10.0000 mL | Freq: Once | INTRAVENOUS | Status: AC
  Administered 2023-06-13: 10 mL

## 2023-06-13 MED ORDER — HEPARIN SOD (PORK) LOCK FLUSH 100 UNIT/ML IV SOLN
500.0000 [IU] | Freq: Once | INTRAVENOUS | Status: AC
  Administered 2023-06-13: 500 [IU]

## 2023-06-13 NOTE — Progress Notes (Signed)
Contacted pt per Dr Candise Che to let pt know ANC: 0.4. Pt encouraged to let ID MD know . Verified pt is still taking Bactrim  , B12 and Folic acid. Pt verified medications and stated he would let ID know about his labs.   Contacted ID FNP [facility] office and verbally reported ANC value. They will contact pt and schedule him for an appointment.

## 2023-06-13 NOTE — Telephone Encounter (Signed)
Received call from cancer center wanting to notify Logan Ward that patient's ANC was 0.4. Logan Ward is done with his treatment for Burkitt's Lymphoma, so this would not be contributing to the low count.   Logan Ward is also due for an appointment, called him and scheduled. He is requesting refills of his Bactrim, notified him that a refill is on file at [facility].   Sandie Ano, RN

## 2023-06-14 ENCOUNTER — Other Ambulatory Visit: Payer: Self-pay

## 2023-06-15 ENCOUNTER — Other Ambulatory Visit: Payer: Self-pay

## 2023-06-25 ENCOUNTER — Other Ambulatory Visit (HOSPITAL_COMMUNITY): Payer: Self-pay

## 2023-06-25 ENCOUNTER — Other Ambulatory Visit: Payer: Self-pay

## 2023-06-25 DIAGNOSIS — C8378 Burkitt lymphoma, lymph nodes of multiple sites: Secondary | ICD-10-CM

## 2023-06-26 ENCOUNTER — Inpatient Hospital Stay (HOSPITAL_BASED_OUTPATIENT_CLINIC_OR_DEPARTMENT_OTHER): Payer: 59 | Admitting: Hematology

## 2023-06-26 ENCOUNTER — Other Ambulatory Visit: Payer: Self-pay

## 2023-06-26 ENCOUNTER — Inpatient Hospital Stay: Payer: 59 | Attending: Hematology

## 2023-06-26 ENCOUNTER — Encounter: Payer: Self-pay | Admitting: Family

## 2023-06-26 ENCOUNTER — Ambulatory Visit (INDEPENDENT_AMBULATORY_CARE_PROVIDER_SITE_OTHER): Payer: 59 | Admitting: Family

## 2023-06-26 VITALS — BP 114/73 | HR 66 | Temp 98.0°F | Resp 16 | Wt 288.0 lb

## 2023-06-26 VITALS — BP 110/73 | HR 63 | Temp 97.5°F | Resp 20 | Wt 288.5 lb

## 2023-06-26 DIAGNOSIS — Z79899 Other long term (current) drug therapy: Secondary | ICD-10-CM | POA: Insufficient documentation

## 2023-06-26 DIAGNOSIS — C8378 Burkitt lymphoma, lymph nodes of multiple sites: Secondary | ICD-10-CM

## 2023-06-26 DIAGNOSIS — Z79624 Long term (current) use of inhibitors of nucleotide synthesis: Secondary | ICD-10-CM | POA: Diagnosis not present

## 2023-06-26 DIAGNOSIS — Z452 Encounter for adjustment and management of vascular access device: Secondary | ICD-10-CM | POA: Insufficient documentation

## 2023-06-26 DIAGNOSIS — B20 Human immunodeficiency virus [HIV] disease: Secondary | ICD-10-CM | POA: Insufficient documentation

## 2023-06-26 DIAGNOSIS — D709 Neutropenia, unspecified: Secondary | ICD-10-CM | POA: Diagnosis not present

## 2023-06-26 DIAGNOSIS — Z7952 Long term (current) use of systemic steroids: Secondary | ICD-10-CM | POA: Insufficient documentation

## 2023-06-26 DIAGNOSIS — R21 Rash and other nonspecific skin eruption: Secondary | ICD-10-CM

## 2023-06-26 DIAGNOSIS — Z79620 Long term (current) use of immunosuppressive biologic: Secondary | ICD-10-CM | POA: Diagnosis not present

## 2023-06-26 DIAGNOSIS — C8370 Burkitt lymphoma, unspecified site: Secondary | ICD-10-CM | POA: Diagnosis present

## 2023-06-26 DIAGNOSIS — D701 Agranulocytosis secondary to cancer chemotherapy: Secondary | ICD-10-CM

## 2023-06-26 LAB — CMP (CANCER CENTER ONLY)
ALT: 16 U/L (ref 0–44)
AST: 20 U/L (ref 15–41)
Albumin: 4.2 g/dL (ref 3.5–5.0)
Alkaline Phosphatase: 108 U/L (ref 38–126)
Anion gap: 5 (ref 5–15)
BUN: 8 mg/dL (ref 6–20)
CO2: 26 mmol/L (ref 22–32)
Calcium: 9.1 mg/dL (ref 8.9–10.3)
Chloride: 107 mmol/L (ref 98–111)
Creatinine: 1.03 mg/dL (ref 0.61–1.24)
GFR, Estimated: >60 mL/min (ref >60)
Glucose, Bld: 71 mg/dL (ref 70–99)
Potassium: 3.7 mmol/L (ref 3.5–5.1)
Sodium: 138 mmol/L (ref 135–145)
Total Bilirubin: 1.1 mg/dL (ref 0.3–1.2)
Total Protein: 7 g/dL (ref 6.5–8.1)

## 2023-06-26 LAB — CBC WITH DIFFERENTIAL (CANCER CENTER ONLY)
Abs Immature Granulocytes: 0.11 10*3/uL — ABNORMAL HIGH (ref 0.00–0.07)
Basophils Absolute: 0 10*3/uL (ref 0.0–0.1)
Basophils Relative: 1 %
Eosinophils Absolute: 0 10*3/uL (ref 0.0–0.5)
Eosinophils Relative: 1 %
HCT: 34.9 % — ABNORMAL LOW (ref 39.0–52.0)
Hemoglobin: 11.1 g/dL — ABNORMAL LOW (ref 13.0–17.0)
Immature Granulocytes: 5 %
Lymphocytes Relative: 57 %
Lymphs Abs: 1.2 10*3/uL (ref 0.7–4.0)
MCH: 26.7 pg (ref 26.0–34.0)
MCHC: 31.8 g/dL (ref 30.0–36.0)
MCV: 84.1 fL (ref 80.0–100.0)
Monocytes Absolute: 0.4 10*3/uL (ref 0.1–1.0)
Monocytes Relative: 18 %
Neutro Abs: 0.4 10*3/uL — CL (ref 1.7–7.7)
Neutrophils Relative %: 18 %
Platelet Count: 173 10*3/uL (ref 150–400)
RBC: 4.15 MIL/uL — ABNORMAL LOW (ref 4.22–5.81)
RDW: 14.6 % (ref 11.5–15.5)
WBC Count: 2.2 10*3/uL — ABNORMAL LOW (ref 4.0–10.5)
nRBC: 0 % (ref 0.0–0.2)

## 2023-06-26 MED ORDER — HEPARIN SOD (PORK) LOCK FLUSH 100 UNIT/ML IV SOLN
500.0000 [IU] | Freq: Once | INTRAVENOUS | Status: AC
  Administered 2023-06-26: 500 [IU]

## 2023-06-26 MED ORDER — SODIUM CHLORIDE 0.9% FLUSH
10.0000 mL | Freq: Once | INTRAVENOUS | Status: AC
  Administered 2023-06-26: 10 mL

## 2023-06-26 NOTE — Assessment & Plan Note (Signed)
Neutropenia recently and currently on Bactrim. Unlikely that Bactrim is causing suppression however will reduce dose to 3 times weekly for now to see if that helps to enhance his neutrophil recovery. Could consider dapsone, although even though low risk can still result in neutropenia.

## 2023-06-26 NOTE — Progress Notes (Signed)
Brief Narrative   Patient ID: Logan Ward, male    DOB: May 01, 1981, 42 y.o.   MRN: [id number]  Mr. Knipple is a 41 y/o AA male diagnosed with HIV disease in December 2020 and starting care after ED visit on 07/25/22 with initial viral load of 54,600 and CD4 count of 115. Risk factor for HIV acquisition is bisexual contact. Genotype with no significant medication resistant mutations. Developed Burkitt's lymphoma and currently undergoing chemotherapy.Logan Ward care at [facility] Stage 3.. Started on Biktarvy and Bactrim.     Subjective:    Chief Complaint  Patient presents with   Follow-up    B20 - patient seen oncologist today and was told bactrim needs to be lowered to 3 times a week due to low immune system level    HPI:  Logan Ward is a 42 y.o. male with HIV disease complicated by Burkitt's Lymphoma last seen on 04/08/23 with well controlled virus and good adherence and tolerance to Biktarvy supplemented with Bactrim for OI prophylaxis. Viral load was 93 and undetectable and CD4 count 127. In the interim noted to have neutrophilia. Here today for follow up.  Mr. Ramsburg has been doing okay since his last office visit and continues to take his Biktarvy and Bactrim as prescribed. Has noted new onset lesions about 1 month ago with no new recent ones appearing. Small little pimple like lesions that pop and then heal over. Has also been experiencing a numbness sensation going on his left upper thigh that has been going on for about 3 months. Has not changed. Painful at times and was previously prescribed hydrocodone which helped. Condoms and STD testing offered.   Denies fevers, chills, night sweats, headaches, changes in vision, neck pain/stiffness, nausea, diarrhea, vomiting, lesions or rashes.    No Known Allergies    Outpatient Medications Prior to Visit  Medication Sig Dispense Refill   acyclovir (ZOVIRAX) 400 MG tablet Take 1 tablet (400 mg total) by mouth 2 (two) times daily. 30 tablet  0   B Complex-C (B-COMPLEX WITH VITAMIN C) tablet Take 1 tablet by mouth daily. 30 tablet 11   bictegravir-emtricitabine-tenofovir AF (BIKTARVY) 50-200-25 MG TABS tablet Take 1 tablet by mouth daily. 30 tablet 5   dexamethasone (DECADRON) 4 MG tablet Take 1 tablet (4mg ) daily for 2 day after treatment and then 1/2 tablet (2mg ) by mouth daily for 10 days. 30 tablet 0   sulfamethoxazole-trimethoprim (BACTRIM DS) 800-160 MG tablet Take 1 tablet by mouth daily. 30 tablet 1   levofloxacin (LEVAQUIN) 500 MG tablet Take 1 tablet (500 mg total) by mouth daily. Starting taking day after Rituxan infusion (Patient not taking: Reported on 04/21/2023) 10 tablet 0   LORazepam (ATIVAN) 0.5 MG tablet Take 0.5 mg by mouth every 8 (eight) hours as needed for anxiety. (Patient not taking: Reported on 06/26/2023)     senna-docusate (SENOKOT-S) 8.6-50 MG tablet Take 2 tablets by mouth at bedtime as needed for mild constipation. (Patient not taking: Reported on 04/21/2023) 60 tablet 0   No facility-administered medications prior to visit.     Past Medical History:  Diagnosis Date   Anal fissure    HIV infection (HCC)    Internal hemorrhoids    Obesity    Rectal ulcer      Past Surgical History:  Procedure Laterality Date   IR IMAGING GUIDED PORT INSERTION  11/18/2022      Review of Systems  Constitutional:  Negative for appetite change, chills, fatigue, fever  and unexpected weight change.  Eyes:  Negative for visual disturbance.  Respiratory:  Negative for cough, chest tightness, shortness of breath and wheezing.   Cardiovascular:  Negative for chest pain and leg swelling.  Gastrointestinal:  Negative for abdominal pain, constipation, diarrhea, nausea and vomiting.  Genitourinary:  Negative for dysuria, flank pain, frequency, genital sores, hematuria and urgency.  Skin:  Negative for rash.  Allergic/Immunologic: Negative for immunocompromised state.  Neurological:  Negative for dizziness and headaches.       Objective:    BP 114/73   Pulse 66   Temp 98 F (36.7 C) (Oral)   Resp 16   Wt 288 lb (130.6 kg)   SpO2 98%   BMI 38.00 kg/m  Nursing note and vital signs reviewed.  Physical Exam Constitutional:      General: He is not in acute distress.    Appearance: He is well-developed.  Eyes:     Conjunctiva/sclera: Conjunctivae normal.  Cardiovascular:     Rate and Rhythm: Normal rate and regular rhythm.     Heart sounds: Normal heart sounds. No murmur heard.    No friction rub. No gallop.  Pulmonary:     Effort: Pulmonary effort is normal. No respiratory distress.     Breath sounds: Normal breath sounds. No wheezing or rales.  Chest:     Chest wall: No tenderness.  Abdominal:     General: Bowel sounds are normal.     Palpations: Abdomen is soft.     Tenderness: There is no abdominal tenderness.  Musculoskeletal:     Cervical back: Neck supple.     Comments: Left thigh with no obvious deformity, discoloration or edema. Full range of motion and strength is normal.   Lymphadenopathy:     Cervical: No cervical adenopathy.  Skin:    General: Skin is warm and dry.     Findings: No rash.  Neurological:     Mental Status: He is alert and oriented to person, place, and time.  Psychiatric:        Behavior: Behavior normal.        Thought Content: Thought content normal.        Judgment: Judgment normal.         06/26/2023    2:47 PM 04/08/2023    3:02 PM 01/15/2023    9:22 AM 12/19/2022    1:46 PM 10/24/2022    9:41 AM  Depression screen PHQ 2/9  Decreased Interest 0 0 0 0 0  Down, Depressed, Hopeless 0 0 0 0 0  PHQ - 2 Score 0 0 0 0 0       Assessment & Plan:    Patient Active Problem List   Diagnosis Date Noted   Neutropenia (HCC) 06/26/2023   Rash 06/26/2023   Intractable back pain suspect Granix-related 02/21/2023   Lactic acidosis 02/21/2023   Thrombocytopenia (HCC) 02/01/2023   Hyponatremia 01/31/2023   Hypokalemia 01/23/2023   Hematuria 01/11/2023    Neutropenic fever (HCC) 01/10/2023   Pancytopenia (HCC) 01/10/2023   Acute cystitis 01/10/2023   Anemia 01/01/2023   Encounter for chemotherapy management 12/12/2022   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HCC) 12/10/2022   Decreased appetite 12/02/2022   Drug-induced constipation 11/21/2022   Nausea without vomiting 11/21/2022   Acute nonintractable headache 11/21/2022   Encounter for antineoplastic chemotherapy 11/18/2022   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 HIV virus well controlled although cannot rule out HIV related. With no recent new lesions will monitor for now.         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 Completed chemotherapy.        Other   AIDS (acquired immune deficiency syndrome) (HCC) - Primary    Mr. Tuminello continues to have well controlled virus with good adherence and tolerance to [employer]. Reviewed previous lab work and discussed plan of care and U equals U. Check viral load an CD4 count. Continue current dose of Biktarvy. Will change Bactrim to Tuesday/Thursday/Saturday. Plan for follow up in 3 months or sooner if needed.       Relevant Orders   HIV-1 RNA quant-no reflex-bld   T-helper cell (CD4)- ([facility] clinic only)   Neutropenia (HCC)    Neutropenia recently and currently on Bactrim. Unlikely that Bactrim is causing suppression however will reduce dose to 3 times weekly for now to see if that helps to  enhance his neutrophil recovery. Could consider dapsone, although even though low risk can still result in neutropenia.         I have discontinued Keilyn L. Kennard's senna-docusate, LORazepam, and levofloxacin. I am also having him maintain his dexamethasone, B-complex with vitamin C, acyclovir, Biktarvy, and sulfamethoxazole-trimethoprim.   Follow-up: Return in about 3 months (around 09/26/2023), or if symptoms worsen or fail to improve.   Marcos Eke, MSN, FNP-C Nurse Practitioner [facility] for Infectious Disease [facility] Main number: [phone]

## 2023-06-26 NOTE — Assessment & Plan Note (Signed)
Followed by Oncology. Completed chemotherapy.

## 2023-06-26 NOTE — Assessment & Plan Note (Signed)
Logan Ward continues to have well controlled virus with good adherence and tolerance to Biktarvy. Reviewed previous lab work and discussed plan of care and U equals U. Check viral load an CD4 count. Continue current dose of Biktarvy. Will change Bactrim to Tuesday/Thursday/Saturday. Plan for follow up in 3 months or sooner if needed.

## 2023-06-26 NOTE — Assessment & Plan Note (Signed)
Rash with no recent new lesions. HIV virus well controlled although cannot rule out HIV related. With no recent new lesions will monitor for now.

## 2023-06-26 NOTE — Progress Notes (Signed)
[facility] OFFICE CLINIC NOTE  Date of service.06/26/23   CC - post C5 EPOCHR for Burkitts for followup.  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  The patient presented with a rapidly growing right cervical nodal mass extending into the level 2 nodal station of the right supraclavicular nodal station and extension into the superficial right sternocleidomastoid muscle.  There is also hypermetabolic right axillary nodal disease.  No evidence of spleen or solid organ involvement.  No evidence of marrow involvement.  The patient was diagnosed in November/December 2023  HISTORY OF PRESENTING ILLNESS:  Logan Ward is a wonderful 42 y.o. male who has been referred to [place] by Dr Hanley Ben MD for evaluation and management of newly diagnosed Burkitt's lymphoma in the setting of known history of HIV/AIDS.   Patient has a history of HIV/AIDS [last CD4 count on 10/21/2022 of 501 and viral load of 324 copies per mL, acquired through by sexual contact, diagnosed 3 to 4 months ago, follows with [name] Calone], syphilis, obesity presented to the hospital on 11/10/2022 with rapidly growing right anterior chest wall mass.   CT of the neck on 11/10/2022 showed large infiltrative centrally necrotic soft tissue mass throughout the right neck and extending to the upper chest anterior to the clavicle measuring 8.1 x 5.9 x 7.4 cm in size.  There are numerous additional prominent right sided cervical chain lymph nodes measuring 1 cm at level 2A and 1.1 cm in the posterior triangle. CT chest abdomen pelvis done on 11/10/2022 showed enlarged right lower cervical, supraclavicular and bilaterally axillary lymph nodes.  Largest right axillary lymph node measuring 2.2 x 1.7 cm.  Diffuse fat stranding throughout the superior mediastinum.  Prominent subcentimeter retroperitoneal and iliac lymph nodes.  Diffuse retroperitoneal fat stranding of uncertain significance.  No evidence of mass or  evidence of organ metastatic disease in the abdomen or pelvis.  Patient had an ultrasound-guided core needle biopsy of the right neck mass on 11/12/2022 which shows B-cell non-Hodgkin's lymphoma with high-grade features and findings consistent with Burkitt's lymphoma.    PRIOR THERAPY: None  CURRENT THERAPY: Status post 2 cycles of dose adjusted EPOCH-R chemotherapy.  First dose started on 11/18/22.   INTERVAL HISTORY:  Logan Ward 42 y.o. male with Burkitt's lymphoma who is being followed-up for toxicity check. Patient was last seen by me on 03/31/2023 and complained of bilateral leg/hand edema, dizziness, diarrhea, bilateral leg weakness, hyperpigmentation on his tongue and nails, and mild SOB.  Today, he complains of daily left thigh muscular pain. The pain is present with walking and he denies any tenderness, swelling, or redness in the area.   He complains of mild grade 1 neuropathy when waking up in the mornings, which improves with activity. He does endorse numbness but denies any pain associated with his neuropathy.   He complains of SOB on exertion after walking 1.5-2 minutes and generalized weakness when lifting weights.   Patient takes vitamin B complex and Bactrim daily. He also takes Biktarvy, vitamin B and Vitamin C regularly.   He does endorse occasional skin breakouts. Patient denies any fever, chills, night sweats, new lump/bumps, or infections requiring antibiotics. No other acute new symptoms. Patient is eating well and he regularly walks 1 mile daily.   Patient reports that he will be attending a funeral soon after the loss of his brother.   MEDICAL HISTORY: Past Medical History:  Diagnosis Date   Anal fissure    HIV infection (HCC)  Internal hemorrhoids    Obesity    Rectal ulcer     ALLERGIES:  has No Known Allergies.  MEDICATIONS:  Current Outpatient Medications  Medication Sig Dispense Refill   acyclovir (ZOVIRAX) 400 MG tablet Take 1 tablet (400 mg  total) by mouth 2 (two) times daily. 30 tablet 0   B Complex-C (B-COMPLEX WITH VITAMIN C) tablet Take 1 tablet by mouth daily. 30 tablet 11   bictegravir-emtricitabine-tenofovir AF (BIKTARVY) 50-200-25 MG TABS tablet Take 1 tablet by mouth daily. 30 tablet 5   dexamethasone (DECADRON) 4 MG tablet Take 1 tablet (4mg ) daily for 2 day after treatment and then 1/2 tablet (2mg ) by mouth daily for 10 days. 30 tablet 0   levofloxacin (LEVAQUIN) 500 MG tablet Take 1 tablet (500 mg total) by mouth daily. Starting taking day after Rituxan infusion (Patient not taking: Reported on 04/21/2023) 10 tablet 0   LORazepam (ATIVAN) 0.5 MG tablet Take 0.5 mg by mouth every 8 (eight) hours as needed for anxiety.     senna-docusate (SENOKOT-S) 8.6-50 MG tablet Take 2 tablets by mouth at bedtime as needed for mild constipation. (Patient not taking: Reported on 04/21/2023) 60 tablet 0   sulfamethoxazole-trimethoprim (BACTRIM DS) 800-160 MG tablet Take 1 tablet by mouth daily. 30 tablet 1   No current facility-administered medications for this visit.    SURGICAL HISTORY:  Past Surgical History:  Procedure Laterality Date   IR IMAGING GUIDED PORT INSERTION  11/18/2022    REVIEW OF SYSTEMS:    10 Point review of Systems was done is negative except as noted above.   PHYSICAL EXAMINATION: PHONE VISIT .BP 110/73   Pulse 63   Temp (!) 97.5 F (36.4 C)   Resp 20   Wt 288 lb 8 oz (130.9 kg)   SpO2 100%   BMI 38.06 kg/m  GENERAL:alert, in no acute distress and comfortable SKIN: no acute rashes, no significant lesions EYES: conjunctiva are pink and non-injected, sclera anicteric OROPHARYNX: MMM, no exudates, no oropharyngeal erythema or ulceration NECK: supple, no JVD LYMPH:  no palpable lymphadenopathy in the cervical, axillary or inguinal regions LUNGS: clear to auscultation b/l with normal respiratory effort HEART: regular rate & rhythm ABDOMEN:  normoactive bowel sounds , non tender, not distended. Extremity:  no pedal edema PSYCH: alert & oriented x 3 with fluent speech NEURO: no focal motor/sensory deficits   ECOG PERFORMANCE STATUS: 1   LABORATORY DATA:  .[name]    Latest Ref Rng & Units 06/26/2023    9:23 AM 06/13/2023    9:20 AM 03/31/2023    9:11 AM  CBC  WBC 4.0 - 10.5 K/uL 2.2  2.3  3.1   Hemoglobin 13.0 - 17.0 g/dL 56.2  13.0  9.2   Hematocrit 39.0 - 52.0 % 34.9  35.5  30.7   Platelets 150 - 400 K/uL 173  153  144   ANC 400 .    Latest Ref Rng & Units 06/13/2023    9:20 AM 03/31/2023    9:11 AM 03/17/2023    9:10 AM  CMP  Glucose 70 - 99 mg/dL 89  865  784   BUN 6 - 20 mg/dL 7  18  16    Creatinine 0.61 - 1.24 mg/dL 6.96  2.95  2.84   Sodium 135 - 145 mmol/L 140  138  133   Potassium 3.5 - 5.1 mmol/L 3.8  3.7  4.0   Chloride 98 - 111 mmol/L 106  106  96   CO2  22 - 32 mmol/L 28  25  27    Calcium 8.9 - 10.3 mg/dL 9.1  8.6  9.5   Total Protein 6.5 - 8.1 g/dL 7.0  5.8  6.7   Total Bilirubin 0.3 - 1.2 mg/dL 1.4  0.8  0.9   Alkaline Phos 38 - 126 U/L 120  51  81   AST 15 - 41 U/L 24  15  21    ALT 0 - 44 U/L 20  31  35      RADIOGRAPHIC STUDIES:  No results found.   ASSESSMENT/PLAN:   42 year old male with HIV/AIDS with newly diagnosed Burkitt's lymphoma   #1  Stage II recently diagnosed Burkitt's lymphoma Presenting with rapidly growing right neck mass extending into the chest, bilateral x-ray lymphadenopathy and also concern for possible involvement of retroperitoneal lymph nodes. Concerning for at least stage II possibly stage III disease. Some borderline lymphadenopathy in the abdomen could also be from his recently diagnosed HIV/AIDS. No clinical symptoms suggestive of CNS involvement at this time. Brain MRI negative.  No constitutional symptoms. No significant cytopenias to suggest bone marrow involvement. Discussed with patient and he is not interested in fertility preservation or sperm banking. First presented in August at [facility] and was treated  empirically with steroids and antibiotics with improvement in swelling prior to additional progression. -Currently on dose adjusted EPOCH-R. Status post 1 cycle. Tolerated well overall.  -Scheduled for cycle #2 on 12/10/22. Will help facilitate inpatient admission. -Outpatient follow up on 12/17/22 for Rituxan and Neulasta   #2 HIV/AIDS -Following Dr. Carver Fila from ID and currently taking Biktarvy.   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  Patient reports this was completely treated.   #4 Prophylaxis: Continue salt water and baking soda mouthwash rinses 4 times a day to reduce mucositis.  MiraLAX and senna as for constipation Bactrim for prophylactic PJP.   #6:Decreased appetite, nausea, belching, and weight loss: Patient has lost weight since last being seen.  Drug to drug interactions noted with PPIs methotrexate.  Patient encouraged to try Pepcid and take nausea medicine prior to eating.  I sent a prescription for Compazine to alternate with Zofran if needed for nausea.  I will refer the patient to member the nutritionist team.  Patient Dors is some sensation of feeling like food is getting stuck in his esophagus.  I offered referral to GI.  The patient declined at this time.  Patient was open to trying Remeron which is an antidepressant but it can help patient's with insomnia and decreased appetite.  I sent a prescription for this to the patient's pharmacy.  #7: Thrombocytopenia: Platelets have normalized during previous treatment but with nadir down to 71k    PLAN:  -Discussed lab results on 06/26/2023 in detail with patient. CBC showed WBC low at 2.2K, hemoglobin stable at of 11.1, and platelets normal at of 173K. -no indication to initiate G-CSF at this time -WBC slightly low likely due to bactrim -low WBC less likely delayed rxn to RItuxan -infectious disease doctor today to discuss whether suppressed WBC may be from Bactrim. Recommend taking bactrim One tablet 3 times a week -CMP  stable -no clinical signs of recurrent lymphoma at this time -patient endorses muscular pain in left femur, okay to manage with Tylenol -advised patient to avoid crowds to reduce risk of respiratory infections -continue physical therapy sessions and staying regularly active -if patient had any significant infections or fevers, would consider G-CSF support -if  neuropathy is bothersome or persistent, will start patient on medication to address neuropathy, but will hold at this time to avoid additional medications that may cause bone marrow suppression -continue B complex to balance suppression of the bone marrow -Recommended to exercise, at least brisk walking for 30 minutes, and drinking about 2L of water.  -labs in 1 month -repeat labs and CT in 2 months -will plan for port removal after CT scan -patient will be seen by infectious disease doctor later today for evaluation and management   FOLLOW-UP: Portflush and labs and MD visit with in 4-6 weeks F/u with Marcos Eke for ID management.  The total time spent in the appointment was  30  minutes* .  All of the patient's questions were answered with apparent satisfaction. The patient knows to call the clinic with any problems, questions or concerns.   Wyvonnia Lora MD MS AAHIVMS [facility] Hematology/Oncology Physician [facility]  .*Total Encounter Time as defined by the Centers for Medicare and Medicaid Services includes, in addition to the face-to-face time of a patient visit (documented in the note above) non-face-to-face time: obtaining and reviewing outside history, ordering and reviewing medications, tests or procedures, care coordination (communications with other health care professionals or caregivers) and documentation in the medical record.    I,Mitra Faeizi,acting as a Neurosurgeon for Wyvonnia Lora, MD.,have documented all relevant documentation on the behalf of Wyvonnia Lora, MD,as directed by  Wyvonnia Lora, MD while in the  presence of Wyvonnia Lora, MD.  .I have reviewed the above documentation for accuracy and completeness, and I agree with the above. Johney Maine MD

## 2023-06-26 NOTE — Patient Instructions (Addendum)
Nice to see you.  We will check your lab work today.  Take your Bactrim Tuesday, Thursday, Saturday.   Continue to take your other medication daily as prescribed.   Refills have been sent to the pharmacy.  Plan for follow up in 4 months or sooner if needed with lab work on the same day.  Have a great day and stay safe!

## 2023-06-27 ENCOUNTER — Telehealth: Payer: Self-pay | Admitting: Hematology

## 2023-06-27 ENCOUNTER — Other Ambulatory Visit: Payer: Self-pay

## 2023-06-27 LAB — T-HELPER CELL (CD4) - (RCID CLINIC ONLY)
CD4 % Helper T Cell: 17 % — ABNORMAL LOW (ref 33–65)
CD4 T Cell Abs: 217 /uL — ABNORMAL LOW (ref 400–1790)

## 2023-06-27 NOTE — Telephone Encounter (Signed)
Patient is aware of upcoming appointment times/dates.  

## 2023-06-28 LAB — HIV-1 RNA QUANT-NO REFLEX-BLD
HIV 1 RNA Quant: 105 Copies/mL — ABNORMAL HIGH
HIV-1 RNA Quant, Log: 2.02 Log cps/mL — ABNORMAL HIGH

## 2023-07-01 ENCOUNTER — Other Ambulatory Visit (HOSPITAL_COMMUNITY): Payer: Self-pay

## 2023-07-02 ENCOUNTER — Encounter: Payer: Self-pay | Admitting: Hematology

## 2023-07-09 ENCOUNTER — Encounter (HOSPITAL_BASED_OUTPATIENT_CLINIC_OR_DEPARTMENT_OTHER): Payer: Self-pay

## 2023-07-09 ENCOUNTER — Emergency Department (HOSPITAL_BASED_OUTPATIENT_CLINIC_OR_DEPARTMENT_OTHER): Payer: 59 | Admitting: Radiology

## 2023-07-09 ENCOUNTER — Emergency Department (HOSPITAL_BASED_OUTPATIENT_CLINIC_OR_DEPARTMENT_OTHER)
Admission: EM | Admit: 2023-07-09 | Discharge: 2023-07-09 | Disposition: A | Discharge status: Home / Self Care | Payer: 59 | Attending: Emergency Medicine | Admitting: Emergency Medicine

## 2023-07-09 ENCOUNTER — Encounter: Payer: Self-pay | Admitting: Hematology

## 2023-07-09 ENCOUNTER — Other Ambulatory Visit: Payer: Self-pay

## 2023-07-09 ENCOUNTER — Telehealth: Payer: Self-pay | Admitting: *Deleted

## 2023-07-09 ENCOUNTER — Other Ambulatory Visit (HOSPITAL_COMMUNITY): Payer: Self-pay

## 2023-07-09 ENCOUNTER — Other Ambulatory Visit (HOSPITAL_BASED_OUTPATIENT_CLINIC_OR_DEPARTMENT_OTHER): Payer: Self-pay

## 2023-07-09 DIAGNOSIS — B20 Human immunodeficiency virus [HIV] disease: Secondary | ICD-10-CM | POA: Insufficient documentation

## 2023-07-09 DIAGNOSIS — R509 Fever, unspecified: Secondary | ICD-10-CM | POA: Diagnosis present

## 2023-07-09 DIAGNOSIS — R531 Weakness: Secondary | ICD-10-CM | POA: Insufficient documentation

## 2023-07-09 DIAGNOSIS — J189 Pneumonia, unspecified organism: Secondary | ICD-10-CM | POA: Diagnosis not present

## 2023-07-09 DIAGNOSIS — Z20822 Contact with and (suspected) exposure to covid-19: Secondary | ICD-10-CM | POA: Diagnosis not present

## 2023-07-09 DIAGNOSIS — R519 Headache, unspecified: Secondary | ICD-10-CM | POA: Diagnosis not present

## 2023-07-09 DIAGNOSIS — Z8572 Personal history of non-Hodgkin lymphomas: Secondary | ICD-10-CM | POA: Diagnosis not present

## 2023-07-09 DIAGNOSIS — R6883 Chills (without fever): Secondary | ICD-10-CM

## 2023-07-09 HISTORY — DX: Burkitt lymphoma, lymph nodes of multiple sites: C83.78

## 2023-07-09 LAB — BASIC METABOLIC PANEL
Anion gap: 10 (ref 5–15)
BUN: 7 mg/dL (ref 6–20)
CO2: 24 mmol/L (ref 22–32)
Calcium: 9.6 mg/dL (ref 8.9–10.3)
Chloride: 103 mmol/L (ref 98–111)
Creatinine, Ser: 0.97 mg/dL (ref 0.61–1.24)
GFR, Estimated: >60 mL/min (ref >60)
Glucose, Bld: 94 mg/dL (ref 70–99)
Potassium: 4.8 mmol/L (ref 3.5–5.1)
Sodium: 137 mmol/L (ref 135–145)

## 2023-07-09 LAB — URINALYSIS, ROUTINE W REFLEX MICROSCOPIC
Bacteria, UA: NONE SEEN
Bilirubin Urine: NEGATIVE
Glucose, UA: NEGATIVE mg/dL
Hgb urine dipstick: NEGATIVE
Ketones, ur: NEGATIVE mg/dL
Leukocytes,Ua: NEGATIVE
Nitrite: NEGATIVE
Protein, ur: 30 mg/dL — AB
Specific Gravity, Urine: 1.026 (ref 1.005–1.030)
pH: 6 (ref 5.0–8.0)

## 2023-07-09 LAB — SARS CORONAVIRUS 2 BY RT PCR: SARS Coronavirus 2 by RT PCR: NEGATIVE

## 2023-07-09 MED ORDER — AZITHROMYCIN 250 MG PO TABS
250.0000 mg | ORAL_TABLET | Freq: Every day | ORAL | 0 refills | Status: DC
  Filled 2023-07-09: qty 6, 5d supply, fill #0

## 2023-07-09 MED ORDER — AZITHROMYCIN 250 MG PO TABS
250.0000 mg | ORAL_TABLET | Freq: Every day | ORAL | 0 refills | Status: DC
  Filled 2023-07-09: qty 6, 6d supply, fill #0

## 2023-07-09 MED ORDER — AMOXICILLIN-POT CLAVULANATE 875-125 MG PO TABS
1.0000 | ORAL_TABLET | Freq: Two times a day (BID) | ORAL | 0 refills | Status: DC
  Filled 2023-07-09: qty 14, 7d supply, fill #0

## 2023-07-09 NOTE — ED Triage Notes (Signed)
States not able to taste anything

## 2023-07-09 NOTE — ED Provider Notes (Signed)
[place] EMERGENCY DEPARTMENT AT [facility] Provider Note   CSN: [id number] Arrival date & time: 07/09/23  1227     History  Chief Complaint  Patient presents with   Headache    Logan Ward is a 42 y.o. male with history of HIV on Biktarvy presenting to the ED with complaint of chills, weakness, congestion, dry cough, and headache for 5 days.  Patient reports that symptoms began on Saturday with subjective fevers and chills, and frontal headache.  Over the past several days he has had a loss of taste and smell, congestion from his nose now resolved.  He says his headache comes and goes, often gone for a few hours at a time and then returns.  He has a very mild headache currently.  He called his PCPs office advised to come into the ED for further evaluation.  Patient ports compliance with his HIV medications, which is Biktarvy as well as Bactrim 3 times weekly as prophylaxis.  Patient also with a history of Burkitt's lymphoma on chemotherapy  06/26/23 labs reviewed -relatively low, most undetectable viral load; CD4 count 217  HPI     Home Medications Prior to Admission medications   Medication Sig Start Date End Date Taking? Authorizing Provider  acyclovir (ZOVIRAX) 400 MG tablet Take 1 tablet (400 mg total) by mouth 2 (two) times daily. 03/07/23   Johney Maine, MD  B Complex-C (B-COMPLEX WITH VITAMIN C) tablet Take 1 tablet by mouth daily. 03/08/23   Johney Maine, MD  bictegravir-emtricitabine-tenofovir AF (BIKTARVY) 50-200-25 MG TABS tablet Take 1 tablet by mouth daily. 04/08/23   Veryl Speak, FNP  dexamethasone (DECADRON) 4 MG tablet Take 1 tablet (4mg ) daily for 2 day after treatment and then 1/2 tablet (2mg ) by mouth daily for 10 days. 03/07/23   Johney Maine, MD  sulfamethoxazole-trimethoprim (BACTRIM DS) 800-160 MG tablet Take 1 tablet by mouth daily. 06/03/23   Johney Maine, MD      Allergies    Patient has no known allergies.     Review of Systems   Review of Systems  Physical Exam Updated Vital Signs BP 115/70 (BP Location: Right Arm)   Pulse 77   Temp 98.4 F (36.9 C)   Resp 19   Ht 6' (1.829 m)   Wt 121.6 kg   SpO2 98%   BMI 36.35 kg/m  Physical Exam Constitutional:      General: He is not in acute distress. HENT:     Head: Normocephalic and atraumatic.  Eyes:     Conjunctiva/sclera: Conjunctivae normal.     Pupils: Pupils are equal, round, and reactive to light.  Cardiovascular:     Rate and Rhythm: Normal rate and regular rhythm.  Pulmonary:     Effort: Pulmonary effort is normal. No respiratory distress.     Breath sounds: Normal breath sounds.  Abdominal:     General: There is no distension.     Tenderness: There is no abdominal tenderness.  Musculoskeletal:     Cervical back: Normal range of motion and neck supple.  Lymphadenopathy:     Cervical: No cervical adenopathy.  Skin:    General: Skin is warm and dry.  Neurological:     General: No focal deficit present.     Mental Status: He is alert and oriented to person, place, and time. Mental status is at baseline.     GCS: GCS eye subscore is 4. GCS verbal subscore is 5. GCS motor  subscore is 6.     Cranial Nerves: No cranial nerve deficit, dysarthria or facial asymmetry.  Psychiatric:        Mood and Affect: Mood normal.        Behavior: Behavior normal.     ED Results / Procedures / Treatments   Labs (all labs ordered are listed, but only abnormal results are displayed) Labs Reviewed  SARS CORONAVIRUS 2 BY RT PCR  BASIC METABOLIC PANEL  CBC WITH DIFFERENTIAL/PLATELET  URINALYSIS, ROUTINE W REFLEX MICROSCOPIC    EKG None  Radiology DG Chest 2 View  Result Date: 07/09/2023 CLINICAL DATA:  Headache, vomiting, lymphoma, receiving chemotherapy EXAM: CHEST - 2 VIEW COMPARISON:  01/31/2023 FINDINGS: Left IJ power port catheter tip lower SVC level. Heart size and vascularity. Minimal streaky hilar and bibasilar bronchitic  change. Possible tiny 5 mm nodular density versus nodule in the left lower lung. Negative for edema, effusion, or pneumothorax. Trachea midline. Nonobstructive bowel gas pattern. IMPRESSION: 1. Streaky hilar and bibasilar bronchitic change. 2. Possible left lower lung 5 mm nodule versus nipple shadow. Electronically Signed   By: Judie Petit.  Shick M.D.   On: 07/09/2023 15:12    Procedures Procedures    Medications Ordered in ED Medications - No data to display  ED Course/ Medical Decision Making/ A&P                             Medical Decision Making Amount and/or Complexity of Data Reviewed Labs: ordered. Radiology: ordered.   This patient presents to the ED with concern for chills, headache, cough, congestion. This involves an extensive number of treatment options, and is a complaint that carries with it a high risk of complications and morbidity.  The differential diagnosis includes viral syndrome most likely given this constellation of findings, versus bacterial infection versus other  Test Considered:  I have a very low suspicion for meningitis clinically.  The patient's headache has been waxing and waning, he does not have nuchal rigidity or photophobia.  Do not believe he is requiring an emergent lumbar puncture at this time. Doubt bacteremia/sepsis  Co-morbidities that complicate the patient evaluation: History of HIV and immunocompromise status  External records from outside source obtained and reviewed including HIV infection labs from earlier this month  I ordered and personally interpreted labs.  The pertinent results include:  pending at signout  I ordered imaging studies including x-ray of the chest, pending at signout   I have reviewed the patients home medicines and have made adjustments as needed   After the interventions noted above, I reevaluated the patient and found that they have: stayed the same   Dispostion:  Patient signed out to Dr Thereasa Solo EDP at 315 pm  pending labs, UA, imaging.  Anticipate if there is no concern for bacterial infection and patient could be discharged home with likely diagnosis of viral illness.  This was already discussed with the patient who is in agreement.           Final Clinical Impression(s) / ED Diagnoses Final diagnoses:  None    Rx / DC Orders ED Discharge Orders     None         Terald Sleeper, MD 07/09/23 1515

## 2023-07-09 NOTE — ED Triage Notes (Signed)
Onset Sunday of "flu" like symptoms.  Headache off and on; dizziness chills cough. Fever.

## 2023-07-09 NOTE — ED Provider Notes (Signed)
Provider Note MRN:  [id number]  Arrival date & time: 07/09/23    ED Course and Medical Decision Making  Assumed care from Dr Renaye Rakers  at shift change.  See note from prior team for complete details, in brief:  42 yo male Hx HIV Prior viral load undetectable  Compliant with antivirals Here w/ headache, anosmia, loss taste, congestion, cough, rigors x5 days On chemo for Burkitt's    Plan per prior physician f/u labs/imaging  Clinical Course as of 07/09/23 1649  Wed Jul 09, 2023  1633 Pt refusing further workup, requesting discharge. CBC was clotted and unable to run. He is afebrile, vitals stable, overall well appearing. He has possible pna on his CXR, given his immunocompromised status will go ahead and treat with abx for pna. Advised him to f/u with his pcp and ID.  [SG]    Clinical Course User Index [SG] Tanda Rockers A, DO   Overall well appearing Refusing to have CBC drawn On RA No fever No resp distress Will treat for possible PNA, have him f/u with his pcp for recheck in the next few days or return back here for any worsening or worrisome symptoms.    The patient has requested to leave the ED against medical advice. I believe this patient is of sound mind and medical decision making capacity to refuse medical care. The patient is responding and asking questions appropriately. The patient is oriented to person, place and time. The patient is not psychotic, delusional, suicidal, homicidal or hallucinating. The patient demonstrates a normal mental capacity to make decisions regarding their healthcare. The patient is clinically sober and does not appear to be under the influence of any illicit drugs at this time. The patient has been advised of the risks, in layman terms, of leaving AMA which include, but are not limited to death, coma, permanent disability, loss of current lifestyle, delay in diagnosis. Alternatives have been offered - the patient remains steadfast in their wish to  leave. The patient has been advised that should they change their mind they are welcome to return to this hospital, or any other, at any time. The patient understands that in no way does an AMA discharge mean that I do not want them to have the best medical care available. To this end, I have offered appropriate prescriptions, referrals, and discharge instructions. The patient did not sign AMA paperwork. The above discussion was witnessed by another member of staff.    Procedures  Final Clinical Impressions(s) / ED Diagnoses     ICD-10-CM   1. Pneumonia due to infectious organism, unspecified laterality, unspecified part of lung  J18.9     2. Chills  R68.83     3. Nonintractable headache, unspecified chronicity pattern, unspecified headache type  R51.9       ED Discharge Orders          Ordered    azithromycin (ZITHROMAX) 250 MG tablet  Daily,   Status:  Discontinued        07/09/23 1647    azithromycin (ZITHROMAX) 250 MG tablet  Daily        07/09/23 1647    amoxicillin-clavulanate (AUGMENTIN) 875-125 MG tablet  Every 12 hours        07/09/23 1648              Discharge Instructions      It was a pleasure caring for you today in the emergency department.  Please return to the emergency department for any worsening  or worrisome symptoms.  Please follow up with your PCP for recheck           Sloan Leiter, DO 07/09/23 1649

## 2023-07-09 NOTE — ED Notes (Signed)
Patient needing redraw of CBC.  Patient refusing to be stuck and refusing any more blood work.  Dr. Wallace Cullens notified

## 2023-07-09 NOTE — Discharge Instructions (Addendum)
It was a pleasure caring for you today in the emergency department.  Please return to the emergency department for any worsening or worrisome symptoms.  Please follow up with your PCP for recheck

## 2023-07-09 NOTE — Telephone Encounter (Signed)
Returned PC to patient - he called earlier stating he feels like his immune system is down, has had a HA x 3 days, started vomiting yesterday - has vomited twice this morning.  He took nausea medication but states it was his last pill.  He also C/O dizziness, feeling very "wobbly" & states he fell yesterday.  Per I. Thayil PA-C & K. [facility] PA-C, patient advised to go to ED for evaluation.  He verbalizes understanding.

## 2023-07-17 ENCOUNTER — Other Ambulatory Visit (HOSPITAL_COMMUNITY): Payer: Self-pay

## 2023-07-21 ENCOUNTER — Telehealth: Payer: Self-pay

## 2023-07-21 NOTE — Telephone Encounter (Signed)
Patient called requesting to speak with Carver Fila, NP - advised him that provider is seeing patients but I would send a message back.

## 2023-07-22 ENCOUNTER — Other Ambulatory Visit (HOSPITAL_COMMUNITY): Payer: Self-pay

## 2023-07-22 ENCOUNTER — Ambulatory Visit (INDEPENDENT_AMBULATORY_CARE_PROVIDER_SITE_OTHER): Payer: 59 | Admitting: Internal Medicine

## 2023-07-22 ENCOUNTER — Other Ambulatory Visit (HOSPITAL_COMMUNITY)
Admission: RE | Admit: 2023-07-22 | Discharge: 2023-07-22 | Disposition: A | Discharge status: Home / Self Care | Payer: 59 | Source: Ambulatory Visit | Attending: Internal Medicine | Admitting: Internal Medicine

## 2023-07-22 ENCOUNTER — Other Ambulatory Visit: Payer: Self-pay

## 2023-07-22 VITALS — BP 109/68 | HR 78 | Resp 16 | Ht 72.0 in | Wt 290.0 lb

## 2023-07-22 DIAGNOSIS — A5149 Other secondary syphilitic conditions: Secondary | ICD-10-CM

## 2023-07-22 DIAGNOSIS — B20 Human immunodeficiency virus [HIV] disease: Secondary | ICD-10-CM

## 2023-07-22 DIAGNOSIS — R21 Rash and other nonspecific skin eruption: Secondary | ICD-10-CM | POA: Diagnosis not present

## 2023-07-22 MED ORDER — KETOCONAZOLE 2 % EX CREA
1.0000 | TOPICAL_CREAM | Freq: Two times a day (BID) | CUTANEOUS | 1 refills | Status: DC
Start: 2023-07-22 — End: 2024-10-21
  Filled 2023-07-22: qty 60, 30d supply, fill #0

## 2023-07-22 NOTE — Progress Notes (Signed)
RFV: follow up with hiv disease  Patient ID: Logan Ward, male   DOB: 1981/04/21, 42 y.o.   MRN: [id number]  HPI Logan Ward is a 42yo M with hiv disease, dx with burkitt's lymphoma, Stage II recently diagnosed Burkitt's lymphoma Presenting with rapidly growing right neck mass extending into the chest, bilateral x-ray lymphadenopathy and also concern for possible involvement of retroperitoneal lymph nodes. Concerning for at least stage II possibly stage III disease. Finished 6 cycles of R EPOCH   on biktarvy and bactrim DS three times per week  Fungal skin infection, causing hypopigmentation  Outpatient Encounter Medications as of 07/22/2023  Medication Sig   B Complex-C (B-COMPLEX WITH VITAMIN C) tablet Take 1 tablet by mouth daily.   bictegravir-emtricitabine-tenofovir AF (BIKTARVY) 50-200-25 MG TABS tablet Take 1 tablet by mouth daily.   sulfamethoxazole-trimethoprim (BACTRIM DS) 800-160 MG tablet Take 1 tablet by mouth daily.   acyclovir (ZOVIRAX) 400 MG tablet Take 1 tablet (400 mg total) by mouth 2 (two) times daily. (Patient not taking: Reported on 07/22/2023)   amoxicillin-clavulanate (AUGMENTIN) 875-125 MG tablet Take 1 tablet by mouth every 12 (twelve) hours. (Patient not taking: Reported on 07/22/2023)   azithromycin (ZITHROMAX) 250 MG tablet Take 1 tablet (250 mg total) by mouth daily. Take first 2 tablets together, then 1 every day until finished. (Patient not taking: Reported on 07/22/2023)   dexamethasone (DECADRON) 4 MG tablet Take 1 tablet (4mg ) daily for 2 day after treatment and then 1/2 tablet (2mg ) by mouth daily for 10 days. (Patient not taking: Reported on 07/22/2023)   No facility-administered encounter medications on file as of 07/22/2023.     Patient Active Problem List   Diagnosis Date Noted   Neutropenia (HCC) 06/26/2023   Rash 06/26/2023   Intractable back pain suspect Granix-related 02/21/2023   Lactic acidosis 02/21/2023   Thrombocytopenia (HCC) 02/01/2023    Hyponatremia 01/31/2023   Hypokalemia 01/23/2023   Hematuria 01/11/2023   Neutropenic fever (HCC) 01/10/2023   Pancytopenia (HCC) 01/10/2023   Acute cystitis 01/10/2023   Anemia 01/01/2023   Encounter for chemotherapy management 12/12/2022   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HCC) 12/10/2022   Decreased appetite 12/02/2022   Drug-induced constipation 11/21/2022   Nausea without vomiting 11/21/2022   Acute nonintractable headache 11/21/2022   Encounter for antineoplastic chemotherapy 11/18/2022   Burkitt lymphoma of lymph nodes of multiple regions [facility]) 11/14/2022   Counseling regarding advance care planning and goals of care 11/14/2022   Chest mass 11/10/2022   Exposure to chlamydia 10/24/2022   Submental lymphadenopathy 09/05/2022   Goals of care, counseling/discussion 07/26/2022   History of syphilis 07/26/2022   Neck swelling 07/26/2022   AIDS (acquired immune deficiency syndrome) (HCC) 07/25/2022     Health Maintenance Due  Topic Date Due   COVID-19 Vaccine (1) Never done   INFLUENZA VACCINE  07/17/2023     Review of Systems 12 point ros is negative Physical Exam   BP 109/68   Pulse 78   Resp 16   Ht 6' (1.829 m)   Wt 290 lb (131.5 kg)   SpO2 98%   BMI 39.33 kg/m   Physical Exam  Constitutional: He is oriented to person, place, and time. He appears well-developed and well-nourished. No distress.  HENT:  Mouth/Throat: Oropharynx is clear and moist. No oropharyngeal exudate.  Cardiovascular: Normal rate, regular rhythm and normal heart sounds. Exam reveals no gallop and no friction rub.  No murmur heard.  Pulmonary/Chest: Effort normal and breath sounds normal. No respiratory distress.  He has no wheezes.  Abdominal: Soft. Bowel sounds are normal. He exhibits no distension. There is no tenderness.  Lymphadenopathy:  He has no cervical adenopathy.  Neurological: He is alert and oriented to person, place, and time.  Skin: Skin is warm and dry. No rash noted. No erythema.   Psychiatric: He has a normal mood and affect. His behavior is normal.   Lab Results  Component Value Date   CD4TCELL 17 (L) 06/26/2023   Lab Results  Component Value Date   CD4TABS 217 (L) 06/26/2023   CD4TABS 127 (L) 04/08/2023   CD4TABS 190 (L) 12/19/2022   Lab Results  Component Value Date   HIV1RNAQUANT 105 (H) 06/26/2023   No results found for: "HEPBSAB" Lab Results  Component Value Date   LABRPR NON REACTIVE 07/23/2022    CBC Lab Results  Component Value Date   WBC 2.2 (L) 06/26/2023   RBC 4.15 (L) 06/26/2023   HGB 11.1 (L) 06/26/2023   HCT 34.9 (L) 06/26/2023   PLT 173 06/26/2023   MCV 84.1 06/26/2023   MCH 26.7 06/26/2023   MCHC 31.8 06/26/2023   RDW 14.6 06/26/2023   LYMPHSABS 1.2 06/26/2023   MONOABS 0.4 06/26/2023   EOSABS 0.0 06/26/2023    BMET Lab Results  Component Value Date   NA 137 07/09/2023   K 4.8 07/09/2023   CL 103 07/09/2023   CO2 24 07/09/2023   GLUCOSE 94 07/09/2023   BUN 7 07/09/2023   CREATININE 0.97 07/09/2023   CALCIUM 9.6 07/09/2023   GFRNONAA >60 07/09/2023   GFRAA >60 12/03/2017      Assessment and Plan  HIV disease=  he reports taking--biktarvy daily - represents --great adherence. Has low level viremia. 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

## 2023-07-23 ENCOUNTER — Telehealth: Payer: Self-pay

## 2023-07-23 ENCOUNTER — Other Ambulatory Visit (HOSPITAL_COMMUNITY): Payer: Self-pay

## 2023-07-23 NOTE — Telephone Encounter (Signed)
Patient called to speak with Dr. Drue Second or Carver Fila, FNP regarding test from yesterday. States that he was told results should be back today. Informed patient that the test that were ordered yesterday can take up a few days. Will forward message to provider to follow up with patient once results are back per his request. Juanita Laster, RMA

## 2023-07-25 ENCOUNTER — Other Ambulatory Visit (HOSPITAL_COMMUNITY): Payer: Self-pay

## 2023-07-25 NOTE — Progress Notes (Signed)
The ASCVD Risk score (Arnett DK, et al., 2019) failed to calculate for the following reasons:   The valid total cholesterol range is 130 to 320 mg/dL  Sandie Ano, RN

## 2023-08-05 ENCOUNTER — Telehealth: Payer: Self-pay | Admitting: Family

## 2023-08-05 NOTE — Telephone Encounter (Signed)
Logan Ward called requesting to schedule with [name] today. After informing him of the next available, he requested to speak with Aundra Millet, who is also out of the office. Logan Ward requested a call back from [name] or [name] when available. At the time, no one was available in triage so I stated I could have someone reach out to him. Patient would not be more specific and declined speaking with anyone else. He can be reached at [phone].

## 2023-08-05 NOTE — Telephone Encounter (Signed)
Called Kathy at the number provided and his mother answered the phone. No additional details were provided and advised that I would call back. 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 Will attempt call again in about 30 minutes.   Marcos Eke, NP 08/05/2023 10:47 AM

## 2023-08-08 ENCOUNTER — Other Ambulatory Visit: Payer: Self-pay

## 2023-08-08 DIAGNOSIS — C8378 Burkitt lymphoma, lymph nodes of multiple sites: Secondary | ICD-10-CM

## 2023-08-08 NOTE — Telephone Encounter (Signed)
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 Drue Second and will need 2.4 million units of Bicillin IM once. I have tried on several occasions to reach him and have been unable. Thanks!

## 2023-08-11 ENCOUNTER — Inpatient Hospital Stay: Payer: 59 | Admitting: Hematology

## 2023-08-11 ENCOUNTER — Inpatient Hospital Stay: Payer: 59
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
## 2023-08-11 NOTE — Telephone Encounter (Signed)
Left message with patient mother asking for patient to return my call.  Raeana Blinn Lesli Albee, CMA

## 2023-08-12 NOTE — Telephone Encounter (Signed)
Attempted to reach patient, mother answered the phone state she will him reach  out to our office. Previous mychart message sent and there is no other contact information for patient at this time.   Valarie Cones, LPN

## 2023-08-14 ENCOUNTER — Inpatient Hospital Stay (HOSPITAL_BASED_OUTPATIENT_CLINIC_OR_DEPARTMENT_OTHER): Payer: 59 | Admitting: Hematology

## 2023-08-14 ENCOUNTER — Other Ambulatory Visit: Payer: Self-pay

## 2023-08-14 ENCOUNTER — Other Ambulatory Visit (HOSPITAL_COMMUNITY): Payer: Self-pay

## 2023-08-14 ENCOUNTER — Ambulatory Visit (INDEPENDENT_AMBULATORY_CARE_PROVIDER_SITE_OTHER): Payer: 59

## 2023-08-14 ENCOUNTER — Inpatient Hospital Stay: Payer: 59 | Attending: Hematology

## 2023-08-14 VITALS — BP 106/63 | HR 69 | Temp 98.9°F | Resp 16 | Wt 283.6 lb

## 2023-08-14 DIAGNOSIS — C8378 Burkitt lymphoma, lymph nodes of multiple sites: Secondary | ICD-10-CM

## 2023-08-14 DIAGNOSIS — R21 Rash and other nonspecific skin eruption: Secondary | ICD-10-CM | POA: Diagnosis not present

## 2023-08-14 DIAGNOSIS — C8371 Burkitt lymphoma, lymph nodes of head, face, and neck: Secondary | ICD-10-CM | POA: Insufficient documentation

## 2023-08-14 DIAGNOSIS — Z7952 Long term (current) use of systemic steroids: Secondary | ICD-10-CM | POA: Insufficient documentation

## 2023-08-14 DIAGNOSIS — Z452 Encounter for adjustment and management of vascular access device: Secondary | ICD-10-CM | POA: Insufficient documentation

## 2023-08-14 DIAGNOSIS — Z79624 Long term (current) use of inhibitors of nucleotide synthesis: Secondary | ICD-10-CM | POA: Diagnosis not present

## 2023-08-14 DIAGNOSIS — B20 Human immunodeficiency virus [HIV] disease: Secondary | ICD-10-CM | POA: Insufficient documentation

## 2023-08-14 DIAGNOSIS — A539 Syphilis, unspecified: Secondary | ICD-10-CM

## 2023-08-14 LAB — CBC WITH DIFFERENTIAL (CANCER CENTER ONLY)
Abs Immature Granulocytes: 0.02 10*3/uL (ref 0.00–0.07)
Basophils Absolute: 0 10*3/uL (ref 0.0–0.1)
Basophils Relative: 1 %
Eosinophils Absolute: 0 10*3/uL (ref 0.0–0.5)
Eosinophils Relative: 1 %
HCT: 30.8 % — ABNORMAL LOW (ref 39.0–52.0)
Hemoglobin: 9.7 g/dL — ABNORMAL LOW (ref 13.0–17.0)
Immature Granulocytes: 1 %
Lymphocytes Relative: 37 %
Lymphs Abs: 1.4 10*3/uL (ref 0.7–4.0)
MCH: 25.6 pg — ABNORMAL LOW (ref 26.0–34.0)
MCHC: 31.5 g/dL (ref 30.0–36.0)
MCV: 81.3 fL (ref 80.0–100.0)
Monocytes Absolute: 0.4 10*3/uL (ref 0.1–1.0)
Monocytes Relative: 12 %
Neutro Abs: 1.8 10*3/uL (ref 1.7–7.7)
Neutrophils Relative %: 48 %
Platelet Count: 285 10*3/uL (ref 150–400)
RBC: 3.79 MIL/uL — ABNORMAL LOW (ref 4.22–5.81)
RDW: 16.9 % — ABNORMAL HIGH (ref 11.5–15.5)
WBC Count: 3.7 10*3/uL — ABNORMAL LOW (ref 4.0–10.5)
nRBC: 0 % (ref 0.0–0.2)

## 2023-08-14 LAB — CMP (CANCER CENTER ONLY)
ALT: 11 U/L (ref 0–44)
AST: 16 U/L (ref 15–41)
Albumin: 4 g/dL (ref 3.5–5.0)
Alkaline Phosphatase: 77 U/L (ref 38–126)
Anion gap: 6 (ref 5–15)
BUN: 10 mg/dL (ref 6–20)
CO2: 26 mmol/L (ref 22–32)
Calcium: 9.1 mg/dL (ref 8.9–10.3)
Chloride: 104 mmol/L (ref 98–111)
Creatinine: 1 mg/dL (ref 0.61–1.24)
GFR, Estimated: >60 mL/min (ref >60)
Glucose, Bld: 100 mg/dL — ABNORMAL HIGH (ref 70–99)
Potassium: 4 mmol/L (ref 3.5–5.1)
Sodium: 136 mmol/L (ref 135–145)
Total Bilirubin: 0.7 mg/dL (ref 0.3–1.2)
Total Protein: 7.6 g/dL (ref 6.5–8.1)

## 2023-08-14 MED ORDER — PENICILLIN G BENZATHINE 1200000 UNIT/2ML IM SUSY
2.4000 10*6.[IU] | PREFILLED_SYRINGE | Freq: Once | INTRAMUSCULAR | Status: AC
Start: 2023-08-14 — End: 2023-08-14
  Administered 2023-08-14: 2.4 10*6.[IU] via INTRAMUSCULAR

## 2023-08-14 MED ORDER — SODIUM CHLORIDE 0.9% FLUSH
10.0000 mL | Freq: Once | INTRAVENOUS | Status: AC
  Administered 2023-08-14: 10 mL

## 2023-08-14 MED ORDER — HEPARIN SOD (PORK) LOCK FLUSH 100 UNIT/ML IV SOLN
500.0000 [IU] | Freq: Once | INTRAVENOUS | Status: AC
  Administered 2023-08-14: 500 [IU]
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
## 2023-08-14 NOTE — Progress Notes (Signed)
Patient walked into clinic for syphillis treatment. Received Bicillin 1.2 million units x2. Presents with rash on hands and feet. Understands that he will need to refrain from ALL: sexual activity 10 days after treatment.  Understands he will need to inform recent partners for STD testing/ treatment. Verbalized understanding. No questions at this time.  Patient tolerated injections. Juanita Laster, RMA

## 2023-08-15 ENCOUNTER — Telehealth: Payer: Self-pay | Admitting: Hematology

## 2023-08-15 NOTE — Telephone Encounter (Signed)
Left a message with patient's mother regarding follow up appointment times/dates

## 2023-08-16 ENCOUNTER — Other Ambulatory Visit: Payer: Self-pay

## 2023-08-20 ENCOUNTER — Encounter: Payer: Self-pay | Admitting: Hematology

## 2023-08-20 ENCOUNTER — Other Ambulatory Visit: Payer: Self-pay | Admitting: Hematology

## 2023-08-20 ENCOUNTER — Other Ambulatory Visit (HOSPITAL_COMMUNITY): Payer: Self-pay

## 2023-08-20 DIAGNOSIS — B20 Human immunodeficiency virus [HIV] disease: Secondary | ICD-10-CM

## 2023-08-21 NOTE — Telephone Encounter (Signed)
Beth,  Bactrim needs to be maanged by his ID physician as a part of this HIV/AIDS long term management . Thanks GK

## 2023-09-02 ENCOUNTER — Other Ambulatory Visit: Payer: Self-pay

## 2023-09-02 ENCOUNTER — Other Ambulatory Visit (HOSPITAL_COMMUNITY): Payer: Self-pay

## 2023-09-02 MED ORDER — SULFAMETHOXAZOLE-TRIMETHOPRIM 800-160 MG PO TABS
1.0000 | ORAL_TABLET | Freq: Every day | ORAL | 1 refills | Status: DC
Start: 2023-09-02 — End: 2023-12-02
  Filled 2023-09-02: qty 30, 30d supply, fill #0
  Filled 2023-09-16 (×2): qty 30, 30d supply, fill #1

## 2023-09-05 ENCOUNTER — Other Ambulatory Visit: Payer: Self-pay | Admitting: Hematology

## 2023-09-05 DIAGNOSIS — C8378 Burkitt lymphoma, lymph nodes of multiple sites: Secondary | ICD-10-CM

## 2023-09-05 NOTE — Progress Notes (Signed)
CT neck and chest abdomen pelvis orders placed

## 2023-09-12 ENCOUNTER — Other Ambulatory Visit: Payer: Self-pay

## 2023-09-16 ENCOUNTER — Other Ambulatory Visit: Payer: Self-pay

## 2023-09-16 NOTE — Progress Notes (Signed)
Specialty Pharmacy Refill Coordination Note  Logan Ward is a 42 y.o. male contacted today regarding refills of specialty medication(s) Bictegravir-Emtricitab-Tenofov .  Patient requested Delivery  on 09/25/23  to verified address [address]  [place] [address]   Medication will be filled on 09/24/23.

## 2023-09-18 ENCOUNTER — Encounter: Payer: Self-pay | Admitting: Hematology

## 2023-09-23 ENCOUNTER — Encounter: Payer: Self-pay | Admitting: Hematology

## 2023-09-24 ENCOUNTER — Other Ambulatory Visit: Payer: Self-pay

## 2023-09-26 ENCOUNTER — Ambulatory Visit (INDEPENDENT_AMBULATORY_CARE_PROVIDER_SITE_OTHER): Payer: 59 | Admitting: Family

## 2023-09-26 ENCOUNTER — Telehealth: Payer: Self-pay

## 2023-09-26 ENCOUNTER — Other Ambulatory Visit: Payer: Self-pay

## 2023-09-26 ENCOUNTER — Encounter: Payer: Self-pay | Admitting: Family

## 2023-09-26 VITALS — BP 113/74 | HR 63 | Temp 98.0°F | Ht 73.0 in | Wt 280.0 lb

## 2023-09-26 DIAGNOSIS — Z0000 Encounter for general adult medical examination without abnormal findings: Secondary | ICD-10-CM

## 2023-09-26 DIAGNOSIS — B20 Human immunodeficiency virus [HIV] disease: Secondary | ICD-10-CM

## 2023-09-26 DIAGNOSIS — A5149 Other secondary syphilitic conditions: Secondary | ICD-10-CM

## 2023-09-26 DIAGNOSIS — C8378 Burkitt lymphoma, lymph nodes of multiple sites: Secondary | ICD-10-CM

## 2023-09-26 MED ORDER — BIKTARVY 50-200-25 MG PO TABS
1.0000 | ORAL_TABLET | Freq: Every day | ORAL | 5 refills | Status: DC
Start: 2023-09-26 — End: 2024-05-06
  Filled 2023-09-26 – 2023-11-11 (×2): qty 30, 30d supply, fill #0
  Filled 2023-12-11: qty 30, 30d supply, fill #1
  Filled 2024-01-05 – 2024-01-07 (×2): qty 30, 30d supply, fill #2
  Filled 2024-02-11: qty 30, 30d supply, fill #3
  Filled 2024-03-10: qty 30, 30d supply, fill #4
  Filled 2024-04-02: qty 30, 30d supply, fill #5

## 2023-09-26 NOTE — Assessment & Plan Note (Addendum)
Discussed importance of safe sexual practice and condom use. Condoms and STD testing offered.  Vaccinations declined. Due for anal pap at next office visit.  Encouraged to complete routine dental care.

## 2023-09-26 NOTE — Assessment & Plan Note (Signed)
Rash resolving since syphilis treatment with 2.4 million units of Bicillin. Will plan to recheck RPR with next lab work as it has currently been <3 months since treatment. Discussed RPR test and expected findings with successful treatment.
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
## 2023-09-26 NOTE — Assessment & Plan Note (Signed)
Dmoni continues to have well controlled virus with good adherence and tolerance to [employer]. Reviewed previous lab work and discussed plan of care and U equals U. Will schedule a lab visit to update labs. Discussed the benefits, potential side effects, and logistics associated with Cabenuva use. Genotype reviewed with no significant medication resistant mutations and would be eligible for Cabenuva. He will consider and let us know. Continue current dose of Biktarvy. If CD4 count is >200 I think it would be safe for him to stop Bactrim as there are no current plans for additional treatment for his Burkitt's lymphoma and his CD4 count has been >200 for over 3 months. Plan for follow up in 3 months or sooner if needed with lab work on the same day.

## 2023-09-26 NOTE — Patient Instructions (Addendum)
Nice to see you.  Schedule a lab visit at your convenience   Continue to take your medication daily as prescribed.  Refills have been sent to the pharmacy.  Plan for follow up in 3 months or sooner if needed with lab work on the same day.  Have a great day and stay safe!

## 2023-09-26 NOTE — Telephone Encounter (Signed)
Called patient to reschedule missed, patient was not available left HIPAA compliant message for patient to contact our office.

## 2023-09-26 NOTE — Assessment & Plan Note (Signed)
Stable with no additional treatments planned at this time. Continue management per Oncology.

## 2023-09-29 ENCOUNTER — Other Ambulatory Visit: Payer: 59

## 2023-09-30 ENCOUNTER — Other Ambulatory Visit: Payer: Self-pay

## 2023-10-07 ENCOUNTER — Emergency Department (HOSPITAL_COMMUNITY): Payer: 59

## 2023-10-07 ENCOUNTER — Encounter (HOSPITAL_COMMUNITY): Payer: Self-pay | Admitting: Emergency Medicine

## 2023-10-07 ENCOUNTER — Emergency Department (HOSPITAL_COMMUNITY)
Admission: EM | Admit: 2023-10-07 | Discharge: 2023-10-08 | Disposition: A | Discharge status: Home / Self Care | Payer: 59 | Attending: Emergency Medicine | Admitting: Emergency Medicine

## 2023-10-07 ENCOUNTER — Other Ambulatory Visit: Payer: Self-pay

## 2023-10-07 DIAGNOSIS — R0781 Pleurodynia: Secondary | ICD-10-CM | POA: Insufficient documentation

## 2023-10-07 DIAGNOSIS — S01511A Laceration without foreign body of lip, initial encounter: Secondary | ICD-10-CM | POA: Diagnosis not present

## 2023-10-07 DIAGNOSIS — S3993XA Unspecified injury of pelvis, initial encounter: Secondary | ICD-10-CM | POA: Diagnosis not present

## 2023-10-07 DIAGNOSIS — Y92410 Unspecified street and highway as the place of occurrence of the external cause: Secondary | ICD-10-CM | POA: Diagnosis not present

## 2023-10-07 DIAGNOSIS — S299XXA Unspecified injury of thorax, initial encounter: Secondary | ICD-10-CM | POA: Diagnosis not present

## 2023-10-07 DIAGNOSIS — S3991XA Unspecified injury of abdomen, initial encounter: Secondary | ICD-10-CM | POA: Diagnosis not present

## 2023-10-07 DIAGNOSIS — M79652 Pain in left thigh: Secondary | ICD-10-CM | POA: Diagnosis not present

## 2023-10-07 DIAGNOSIS — R079 Chest pain, unspecified: Secondary | ICD-10-CM | POA: Diagnosis not present

## 2023-10-07 DIAGNOSIS — S199XXA Unspecified injury of neck, initial encounter: Secondary | ICD-10-CM | POA: Diagnosis not present

## 2023-10-07 DIAGNOSIS — R0789 Other chest pain: Secondary | ICD-10-CM | POA: Diagnosis not present

## 2023-10-07 DIAGNOSIS — R1012 Left upper quadrant pain: Secondary | ICD-10-CM | POA: Diagnosis not present

## 2023-10-07 DIAGNOSIS — M25552 Pain in left hip: Secondary | ICD-10-CM | POA: Diagnosis not present

## 2023-10-07 DIAGNOSIS — M79662 Pain in left lower leg: Secondary | ICD-10-CM | POA: Diagnosis not present

## 2023-10-07 DIAGNOSIS — S0990XA Unspecified injury of head, initial encounter: Secondary | ICD-10-CM | POA: Diagnosis not present

## 2023-10-07 LAB — CBC WITH DIFFERENTIAL/PLATELET
Abs Immature Granulocytes: 0.01 10*3/uL (ref 0.00–0.07)
Basophils Absolute: 0 10*3/uL (ref 0.0–0.1)
Basophils Relative: 1 %
Eosinophils Absolute: 0 10*3/uL (ref 0.0–0.5)
Eosinophils Relative: 0 %
HCT: 37.8 % — ABNORMAL LOW (ref 39.0–52.0)
Hemoglobin: 11.9 g/dL — ABNORMAL LOW (ref 13.0–17.0)
Immature Granulocytes: 0 %
Lymphocytes Relative: 46 %
Lymphs Abs: 1.9 10*3/uL (ref 0.7–4.0)
MCH: 27 pg (ref 26.0–34.0)
MCHC: 31.5 g/dL (ref 30.0–36.0)
MCV: 85.9 fL (ref 80.0–100.0)
Monocytes Absolute: 0.4 10*3/uL (ref 0.1–1.0)
Monocytes Relative: 10 %
Neutro Abs: 1.8 10*3/uL (ref 1.7–7.7)
Neutrophils Relative %: 43 %
Platelets: 146 10*3/uL — ABNORMAL LOW (ref 150–400)
RBC: 4.4 MIL/uL (ref 4.22–5.81)
RDW: 15.9 % — ABNORMAL HIGH (ref 11.5–15.5)
WBC: 4.1 10*3/uL (ref 4.0–10.5)
nRBC: 0 % (ref 0.0–0.2)

## 2023-10-07 LAB — COMPREHENSIVE METABOLIC PANEL
ALT: 20 U/L (ref 0–44)
AST: 22 U/L (ref 15–41)
Albumin: 4.2 g/dL (ref 3.5–5.0)
Alkaline Phosphatase: 80 U/L (ref 38–126)
Anion gap: 9 (ref 5–15)
BUN: 12 mg/dL (ref 6–20)
CO2: 23 mmol/L (ref 22–32)
Calcium: 8.9 mg/dL (ref 8.9–10.3)
Chloride: 102 mmol/L (ref 98–111)
Creatinine, Ser: 0.99 mg/dL (ref 0.61–1.24)
GFR, Estimated: >60 mL/min (ref >60)
Glucose, Bld: 114 mg/dL — ABNORMAL HIGH (ref 70–99)
Potassium: 3.8 mmol/L (ref 3.5–5.1)
Sodium: 134 mmol/L — ABNORMAL LOW (ref 135–145)
Total Bilirubin: 1.5 mg/dL — ABNORMAL HIGH (ref 0.3–1.2)
Total Protein: 7.4 g/dL (ref 6.5–8.1)

## 2023-10-07 LAB — I-STAT CHEM 8, ED
BUN: 12 mg/dL (ref 6–20)
Calcium, Ion: 1.23 mmol/L (ref 1.15–1.40)
Chloride: 103 mmol/L (ref 98–111)
Creatinine, Ser: 1.2 mg/dL (ref 0.61–1.24)
Glucose, Bld: 116 mg/dL — ABNORMAL HIGH (ref 70–99)
HCT: 39 % (ref 39.0–52.0)
Hemoglobin: 13.3 g/dL (ref 13.0–17.0)
Potassium: 3.7 mmol/L (ref 3.5–5.1)
Sodium: 139 mmol/L (ref 135–145)
TCO2: 25 mmol/L (ref 22–32)

## 2023-10-07 LAB — ETHANOL: Alcohol, Ethyl (B): <10 mg/dL (ref <10)

## 2023-10-07 MED ORDER — SODIUM CHLORIDE 0.9 % IV BOLUS
1000.0000 mL | Freq: Once | INTRAVENOUS | Status: AC
  Administered 2023-10-07: 1000 mL via INTRAVENOUS

## 2023-10-07 MED ORDER — IOHEXOL 300 MG/ML  SOLN
100.0000 mL | Freq: Once | INTRAMUSCULAR | Status: AC | PRN
  Administered 2023-10-07: 100 mL via INTRAVENOUS
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
## 2023-10-08 DIAGNOSIS — S01511A Laceration without foreign body of lip, initial encounter: Secondary | ICD-10-CM | POA: Diagnosis not present

## 2023-10-08 LAB — TYPE AND SCREEN
ABO/RH(D): AB POS
Antibody Screen: NEGATIVE

## 2023-10-08 MED ORDER — HEPARIN SOD (PORK) LOCK FLUSH 100 UNIT/ML IV SOLN
INTRAVENOUS | Status: AC
  Administered 2023-10-08: 500 [IU]
  Filled 2023-10-08: qty 5

## 2023-10-08 MED ORDER — IBUPROFEN 600 MG PO TABS: 600.0000 mg | ORAL_TABLET | Freq: Four times a day (QID) | ORAL | 0 refills | Status: AC | PRN
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
## 2023-10-08 NOTE — ED Notes (Signed)
Patient tolerated PO challenge without complaints. Patient also ambulated in hallway with steady gait.

## 2023-10-08 NOTE — ED Provider Notes (Signed)
Patient signed out pending CT imaging.  CT imaging reviewed by myself.  No obvious intrathoracic or intra-abdominal injury.  No long bone injuries.  Patient is ambulatory and able to tolerate fluids and p.o. without difficulty.  Discussed with him that he will be very sore in the next coming days.  Ibuprofen for pain management.   Shon Baton, MD 10/08/23 [phone]

## 2023-10-09 ENCOUNTER — Other Ambulatory Visit (HOSPITAL_COMMUNITY): Payer: Self-pay

## 2023-10-09 ENCOUNTER — Ambulatory Visit (HOSPITAL_COMMUNITY)
Admission: EM | Admit: 2023-10-09 | Discharge: 2023-10-09 | Disposition: A | Discharge status: Home / Self Care | Payer: 59 | Attending: Internal Medicine | Admitting: Internal Medicine

## 2023-10-09 ENCOUNTER — Encounter (HOSPITAL_COMMUNITY): Payer: Self-pay

## 2023-10-09 DIAGNOSIS — M545 Low back pain, unspecified: Secondary | ICD-10-CM | POA: Diagnosis not present

## 2023-10-09 MED ORDER — HYDROCODONE-ACETAMINOPHEN 5-325 MG PO TABS
1.0000 | ORAL_TABLET | Freq: Four times a day (QID) | ORAL | 0 refills | Status: AC | PRN
  Filled 2023-10-09: qty 12, 3d supply, fill #0
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
## 2023-10-09 NOTE — Discharge Instructions (Signed)
Go to the emergency department for severe symptoms Follow-up with your primary care doctor

## 2023-10-16 ENCOUNTER — Ambulatory Visit (HOSPITAL_COMMUNITY): Admission: RE | Admit: 2023-10-16 | Payer: 59 | Source: Ambulatory Visit

## 2023-10-16 ENCOUNTER — Other Ambulatory Visit (HOSPITAL_COMMUNITY): Payer: Self-pay

## 2023-10-20 ENCOUNTER — Encounter (HOSPITAL_COMMUNITY): Payer: Self-pay

## 2023-10-20 ENCOUNTER — Other Ambulatory Visit (HOSPITAL_COMMUNITY): Payer: Self-pay

## 2023-10-22 ENCOUNTER — Other Ambulatory Visit (HOSPITAL_COMMUNITY): Payer: Self-pay

## 2023-10-31 ENCOUNTER — Other Ambulatory Visit: Payer: Self-pay

## 2023-10-31 DIAGNOSIS — C8378 Burkitt lymphoma, lymph nodes of multiple sites: Secondary | ICD-10-CM

## 2023-11-03 ENCOUNTER — Inpatient Hospital Stay: Payer: 59

## 2023-11-03 ENCOUNTER — Inpatient Hospital Stay: Payer: 59 | Attending: Hematology | Admitting: Hematology

## 2023-11-03 VITALS — BP 122/71 | HR 65 | Temp 97.7°F | Resp 16 | Ht 73.0 in | Wt 284.0 lb

## 2023-11-03 DIAGNOSIS — Z79624 Long term (current) use of inhibitors of nucleotide synthesis: Secondary | ICD-10-CM | POA: Diagnosis not present

## 2023-11-03 DIAGNOSIS — C8371 Burkitt lymphoma, lymph nodes of head, face, and neck: Secondary | ICD-10-CM | POA: Diagnosis present

## 2023-11-03 DIAGNOSIS — B20 Human immunodeficiency virus [HIV] disease: Secondary | ICD-10-CM | POA: Insufficient documentation

## 2023-11-03 DIAGNOSIS — R590 Localized enlarged lymph nodes: Secondary | ICD-10-CM | POA: Diagnosis not present

## 2023-11-03 DIAGNOSIS — C8378 Burkitt lymphoma, lymph nodes of multiple sites: Secondary | ICD-10-CM | POA: Diagnosis not present

## 2023-11-03 LAB — CMP (CANCER CENTER ONLY)
ALT: 18 U/L (ref 0–44)
AST: 19 U/L (ref 15–41)
Albumin: 4.4 g/dL (ref 3.5–5.0)
Alkaline Phosphatase: 85 U/L (ref 38–126)
Anion gap: 6 (ref 5–15)
BUN: 9 mg/dL (ref 6–20)
CO2: 28 mmol/L (ref 22–32)
Calcium: 9.5 mg/dL (ref 8.9–10.3)
Chloride: 104 mmol/L (ref 98–111)
Creatinine: 1.04 mg/dL (ref 0.61–1.24)
GFR, Estimated: >60 mL/min (ref >60)
Glucose, Bld: 97 mg/dL (ref 70–99)
Potassium: 4 mmol/L (ref 3.5–5.1)
Sodium: 138 mmol/L (ref 135–145)
Total Bilirubin: 1.2 mg/dL — ABNORMAL HIGH (ref <1.2)
Total Protein: 7.6 g/dL (ref 6.5–8.1)

## 2023-11-03 LAB — CBC WITH DIFFERENTIAL (CANCER CENTER ONLY)
Abs Immature Granulocytes: 0 10*3/uL (ref 0.00–0.07)
Basophils Absolute: 0 10*3/uL (ref 0.0–0.1)
Basophils Relative: 1 %
Eosinophils Absolute: 0 10*3/uL (ref 0.0–0.5)
Eosinophils Relative: 1 %
HCT: 38.1 % — ABNORMAL LOW (ref 39.0–52.0)
Hemoglobin: 12.3 g/dL — ABNORMAL LOW (ref 13.0–17.0)
Immature Granulocytes: 0 %
Lymphocytes Relative: 62 %
Lymphs Abs: 2 10*3/uL (ref 0.7–4.0)
MCH: 27.6 pg (ref 26.0–34.0)
MCHC: 32.3 g/dL (ref 30.0–36.0)
MCV: 85.4 fL (ref 80.0–100.0)
Monocytes Absolute: 0.3 10*3/uL (ref 0.1–1.0)
Monocytes Relative: 8 %
Neutro Abs: 0.9 10*3/uL — ABNORMAL LOW (ref 1.7–7.7)
Neutrophils Relative %: 28 %
Platelet Count: 168 10*3/uL (ref 150–400)
RBC: 4.46 MIL/uL (ref 4.22–5.81)
RDW: 14.6 % (ref 11.5–15.5)
WBC Count: 3.2 10*3/uL — ABNORMAL LOW (ref 4.0–10.5)
nRBC: 0 % (ref 0.0–0.2)
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
## 2023-11-05 NOTE — Progress Notes (Signed)
[facility] CSW Progress Note  Clinical Child psychotherapist  received phone call from patient  requesting assistance with being connected to community agency [employer]. CSW attempted to reach agency and left voicemail for Martyn Ehrich to assist with referral.  Marguerita Merles, LCSW Clinical Social Worker [facility]

## 2023-11-06 ENCOUNTER — Telehealth: Payer: Self-pay

## 2023-11-06 ENCOUNTER — Telehealth: Payer: Self-pay | Admitting: Hematology

## 2023-11-06 NOTE — Telephone Encounter (Signed)
Patient's mother is aware of scheduled appointment times/dates

## 2023-11-06 NOTE — Telephone Encounter (Signed)
[facility] CSW Progress Note  Clinical Child psychotherapist  spoke with patient regarding referral to [employer] . CSW received permission to send referral on this date to via email to staff member J. Nichols. Patient denied any other needs at this time. Patient has contact information if necessary. No follow up scheduled at this time.  Marguerita Merles, LCSWA Clinical Social Worker [facility]

## 2023-11-09 ENCOUNTER — Encounter: Payer: Self-pay | Admitting: Hematology

## 2023-11-10 ENCOUNTER — Telehealth: Payer: Self-pay

## 2023-11-10 NOTE — Telephone Encounter (Signed)
[facility] CSW Progress Note  Patient called CSW to follow up on referral placed to [employer] via email on 11/21. Patient inquired if CSW had any updates or received any contact from ageny (No updates at this time). CSW provided patient with agency phone number and contact to follow up on the referral. Patient verbalized understanding.   Marguerita Merles, LCSWA Clinical Social Worker [facility]

## 2023-11-10 NOTE — Progress Notes (Signed)
Berdine Dance, MD  Foy Guadalajara, MD Very minimal change since 04/04/23.  Left ax node has slightly more prominent cortex but preserved fatty hilum.  We can take a look and attempt if visible but may be low  to no yield.  Dr Candise Che, these could be followed also at 4-6 months  Please schedule for [place] bx, if Dr Candise Che wants [place] to take a look and try.  Thanks  TS       Previous Messages    ----- Message ----- From: Claudean Kinds Sent: 11/10/2023   7:42 AM EST To: Claudean Kinds; Ir Procedure Requests Subject: [place] Core Biopsy ( lymph nodes)                  Procedure: [place] Core Biopsy ( lymph nodes)  Reason: Patient with h/o Burkitts lymphoma, HIV/AIDS with left axillary LN for core needle biopsy - flow cytometry, molecular studies Dx: Burkitt lymphoma of lymph nodes of multiple regions (HCC) [C83.78 (ICD-10-CM)]; Axillary lymphadenopathy [R59.0 (ICD-10-CM)]    History: NM PET, CT chest abd pevl w/ , CT cervical spine , CT head  Provider: Johney Maine, MD  Provider Contact:  [phone]

## 2023-11-11 ENCOUNTER — Other Ambulatory Visit: Payer: Self-pay

## 2023-11-11 ENCOUNTER — Other Ambulatory Visit (HOSPITAL_COMMUNITY): Payer: Self-pay

## 2023-11-11 ENCOUNTER — Encounter: Payer: Self-pay | Admitting: Hematology

## 2023-11-11 NOTE — Progress Notes (Signed)
Specialty Pharmacy Refill Coordination Note  Logan Ward is a 42 y.o. male contacted today regarding refills of specialty medication(s) Bictegravir-Emtricitab-Tenofov   Patient requested Delivery   Delivery date: 11/18/23   Verified address: [address] [place] [address]   Medication will be filled on 11/17/23.

## 2023-11-17 ENCOUNTER — Other Ambulatory Visit: Payer: Self-pay

## 2023-11-17 ENCOUNTER — Inpatient Hospital Stay: Payer: 59 | Attending: Hematology

## 2023-11-17 NOTE — Progress Notes (Signed)
[facility] CSW Progress Note  Patient called CSW for assistance with referral for [facility]. CSW attempted to reach organization to provide assistance with the referral, CSW unable to reach agency at time of call. Patient inquired about changing medical insurance, CSW / Patient briefly discussed insurance benefits. Mychart message sent with information.   Marguerita Merles, LCSWA Clinical Social Worker [facility]

## 2023-11-19 ENCOUNTER — Telehealth: Payer: Self-pay

## 2023-11-19 ENCOUNTER — Inpatient Hospital Stay: Payer: 59

## 2023-11-19 NOTE — Progress Notes (Signed)
[facility] CSW Progress Note  Clinical Child psychotherapist  returned patient's missed call.   Patient stated purpose of call was to discuss housing assistance with "[employer]".  CSW is not aware of housing assistance through [employer] and unable to facilitate assistance. CSW explained housing assistance process through [employer], [employer]-Income Housing Complexes, and lastly through [employer] (Voucher Based). CSW informed patient of wait list that can extend to 2.5 years. CSW informed patient there is not any housing assistance / programs through [facility]. Patient verbalized understanding.  Marguerita Merles, LCSWA Clinical Social Worker [facility]

## 2023-11-19 NOTE — Progress Notes (Signed)
[facility] CSW Progress Note  Clinical Social Worker attempted to reach [employer] on patient's behalf to discuss referral. CSW was unable to get through via phone, sent email to J. Nichols to follow up on referral.  Marguerita Merles, LCSWA Clinical Social Worker [facility]

## 2023-11-19 NOTE — Telephone Encounter (Signed)
[facility] CSW Progress Note  Clinical Social Worker  called patient   to relay message CSW received from [employer]. Patient is aware of the case manager assigned and the direct phone number for future questions. Patient verbalized understanding.  Marguerita Merles, LCSWA Clinical Social Worker [facility]

## 2023-11-20 ENCOUNTER — Inpatient Hospital Stay: Payer: 59

## 2023-11-20 NOTE — Progress Notes (Signed)
[facility] CSW Progress Note  Patient contacted CSW via phone for assistance with applying for assistance with external grants. CSW completed [employer] application on patient's behalf.    Marguerita Merles, LCSWA Clinical Social Worker [facility]

## 2023-11-24 ENCOUNTER — Telehealth: Payer: Self-pay

## 2023-11-24 NOTE — Telephone Encounter (Signed)
[facility] CSW Progress Note  Patient called CSW to request status of [employer] organization. CSW informed patient that were referral was submitted last week. CSW referred patient to organization to follow up on referral.   Marguerita Merles, LCSW Clinical Social Worker [facility]

## 2023-11-25 ENCOUNTER — Inpatient Hospital Stay: Payer: 59

## 2023-11-25 NOTE — Progress Notes (Signed)
[facility] CSW Progress Note  CSW informed patient of non-active treatment and impact on application. CSW spoke with Marine scientist who provided permission for patient to apply for grant being patient is 1 month post treatment. Patient notified via email along with deadline for notification.    Marguerita Merles, LCSW Clinical Social Worker [facility]

## 2023-11-25 NOTE — Progress Notes (Signed)
[facility] CSW Progress Note  CSW called patient to speak following email requesting assistance. CSW spoke with patient and friend regarding [employer]. Patient and Patients's friend requested assistance with applying for financial grant for utilities and transportation / food assistance through [employer]. CSW instructed patient with process of applying for financial assistance (need utility, applications are accepted first business day of the month). CSW was instructed by staff member at organization process of apply for aforementioned assistance, CSW informed patient. Patient verbalized understanding that he needs to submit documentation by 12/30 for CSW to apply.    Marguerita Merles, LCSW Clinical Social Worker [facility]

## 2023-11-27 ENCOUNTER — Telehealth: Payer: Self-pay

## 2023-11-27 NOTE — Telephone Encounter (Signed)
[facility] CSW Progress Note  Patient contacted CSW to request update from the [employer]. CSW informed patient of response email sent on 12/10 by CSW, patient stated he did not receive email. CSW informed patient that [employer] stated he is able to submit an application. CSW relayed the message by [employer] that patient is able to apply  for both requested types of assistance totaling no more than $500.00.  Patient stated a peer of his received differing amount of funding from listed organization. CSW is unable to address potential differing amounts due to [facility] being an external grant, and being told the assistance available by the organization. CSW informed patient that requested assistance and documents is needed by 12/30 in order to apply and that funding depletes fast. Patient verbalized understanding.   Marguerita Merles, LCSW Clinical Social Worker [name]

## 2023-12-01 ENCOUNTER — Inpatient Hospital Stay: Payer: 59

## 2023-12-01 ENCOUNTER — Other Ambulatory Visit (HOSPITAL_COMMUNITY): Payer: Self-pay | Admitting: Student

## 2023-12-01 NOTE — Progress Notes (Signed)
[facility] CSW Progress Note  Clinical Child psychotherapist  received an email from patient  to requesting assistance. CSW followed up patient's email with a phone call to clarify request. Patient requested assistance with applying for "[facility]". CSW informed patient that Koren Shiver is an [facility] (tailored plan) of Medicaid. Patient stated need for applying for PCS. CSW referred patient to [facility] Enrollment Broker to discuss switching to [facility]. CSW informed patient of process for applying for PCS services with current medicaid. Csw informed patient for questions about medicaid, the local DSS is the contact.   Marguerita Merles, LCSW Clinical Social Worker [facility]

## 2023-12-02 ENCOUNTER — Ambulatory Visit (HOSPITAL_COMMUNITY)
Admission: RE | Admit: 2023-12-02 | Discharge: 2023-12-02 | Disposition: A | Discharge status: Home / Self Care | Payer: 59 | Source: Ambulatory Visit | Attending: Hematology | Admitting: Hematology

## 2023-12-02 ENCOUNTER — Encounter (HOSPITAL_COMMUNITY): Payer: Self-pay

## 2023-12-02 ENCOUNTER — Other Ambulatory Visit: Payer: Self-pay

## 2023-12-02 ENCOUNTER — Other Ambulatory Visit: Payer: Self-pay | Admitting: Hematology

## 2023-12-02 DIAGNOSIS — C8378 Burkitt lymphoma, lymph nodes of multiple sites: Secondary | ICD-10-CM | POA: Diagnosis not present

## 2023-12-02 DIAGNOSIS — R590 Localized enlarged lymph nodes: Secondary | ICD-10-CM | POA: Diagnosis not present

## 2023-12-02 DIAGNOSIS — Z8572 Personal history of non-Hodgkin lymphomas: Secondary | ICD-10-CM | POA: Diagnosis not present

## 2023-12-02 MED ORDER — MIDAZOLAM HCL 2 MG/2ML IJ SOLN
INTRAMUSCULAR | Status: AC
  Filled 2023-12-02: qty 2

## 2023-12-02 MED ORDER — MIDAZOLAM HCL 2 MG/2ML IJ SOLN
INTRAMUSCULAR | Status: AC | PRN
  Administered 2023-12-02: 1 mg via INTRAVENOUS
  Administered 2023-12-02: .5 mg via INTRAVENOUS

## 2023-12-02 MED ORDER — FENTANYL CITRATE (PF) 100 MCG/2ML IJ SOLN
INTRAMUSCULAR | Status: AC
  Filled 2023-12-02: qty 2

## 2023-12-02 MED ORDER — FENTANYL CITRATE (PF) 100 MCG/2ML IJ SOLN
INTRAMUSCULAR | Status: AC | PRN
  Administered 2023-12-02: 25 ug via INTRAVENOUS

## 2023-12-02 NOTE — H&P (Signed)
Chief Complaint: Patient was seen in consultation today for a  at the request of Johney Maine  Referring Physician(s): Johney Maine  Supervising Physician: Marliss Coots  Patient Status: [facility] - Out-pt  Full Code  History of Present Illness: Logan Ward is a 42 y.o. male with a history of Burkitt's lymphoma and HIV. Patient presents today for a left axillary lymph node biopsy. Patient is currently followed by Dr. Johney Maine for Burkitt's lymphoma. Patient's initial diagnosis was November/December 2023, after presenting with a right cervical node mass. CT pelvis and chest 10/07/23 revealed a enlarged left axillary lymph node. Patient reports having a driver in place and someone to stay with him overnight. Patient reports feeling well today. He denies: chest pain, shortness of breath, nausea, vomiting, diarrhea, abdominal pain.   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  Medications: Prior to Admission medications   Medication Sig Start Date End Date Taking? Authorizing Provider  B Complex-C (B-COMPLEX WITH VITAMIN C) tablet Take 1 tablet by mouth daily. 03/08/23  Yes Johney Maine, MD  bictegravir-emtricitabine-tenofovir AF (BIKTARVY) 50-200-25 MG TABS tablet Take 1 tablet by mouth daily. 09/26/23  Yes Veryl Speak, FNP  HYDROcodone-acetaminophen (NORCO/VICODIN) 5-325 MG tablet Take 1 tablet by mouth every 6 (six) hours as needed. 10/09/23  Yes Meliton Rattan, PA  ibuprofen (ADVIL) 600 MG tablet Take 1 tablet (600 mg total) by mouth every 6 (six) hours as needed. 10/08/23   Horton, Mayer Masker, MD  ketoconazole (NIZORAL) 2 % cream Apply topically  2 times daily for 2 weeks Patient not taking: Reported on 09/26/2023 07/22/23   Judyann Munson, MD  sulfamethoxazole-trimethoprim (BACTRIM DS) 800-160 MG tablet Take 1 tablet by mouth daily. Patient not taking: Reported on 10/08/2023 09/02/23   Veryl Speak, FNP     Family History  Problem Relation Age of Onset   Hypertension Father    Throat cancer Father    Heart attack Brother    Colon cancer Neg Hx    Esophageal cancer Neg Hx    Stomach cancer Neg Hx     Social History   Socioeconomic History   Marital status: Significant Other    Spouse name: Not on file   Number of children: Not on file   Years of education: Not on file   Highest education level: Not on file  Occupational History   Not on file  Tobacco Use   Smoking status: Never   Smokeless tobacco: Never  Vaping Use   Vaping status: Never Used  Substance and Sexual Activity   Alcohol use: No   Drug use: No   Sexual activity: Yes    Comment: declined condoms  Other Topics Concern   Not on file  Social History Narrative   Not on file   Social Drivers of Health   Financial Resource Strain: Low Risk  (10/24/2022)   Overall Financial Resource Strain (CARDIA)    Difficulty of Paying Living Expenses: Not hard at all  Food Insecurity: No Food Insecurity (03/03/2023)   Hunger Vital Sign    Worried About Running Out of Food in the  Last Year: Never true    Ran Out of Food in the Last Year: Never true  Transportation Needs: No Transportation Needs (03/03/2023)   PRAPARE - Administrator, Civil Service (Medical): No    Lack of Transportation (Non-Medical): No  Physical Activity: Sufficiently Active (10/24/2022)   Exercise Vital Sign    Days of Exercise per Week: 3 days    Minutes of Exercise per Session: 60 min  Recent Concern: Physical Activity - Insufficiently Active (10/24/2022)   Exercise Vital Sign    Days of Exercise per Week: 3 days    Minutes of Exercise per Session: 20 min  Stress: No Stress  Concern Present (10/24/2022)   [employer] of Occupational Health - Occupational Stress Questionnaire    Feeling of Stress : Not at all  Social Connections: Unknown (10/24/2022)   Social Connection and Isolation Panel [NHANES]    Frequency of Communication with Friends and Family: Twice a week    Frequency of Social Gatherings with Friends and Family: Twice a week    Attends Religious Services: 1 to 4 times per year    Active Member of [employer] or Organizations: No    Attends Engineer, structural: 1 to 4 times per year    Marital Status: Patient declined      Review of Systems: A 12 point ROS discussed and pertinent positives are indicated in the HPI above.  All other systems are negative.  Review of Systems  Constitutional:  Negative for fever.  Respiratory:  Negative for shortness of breath.   Cardiovascular:  Negative for chest pain.  Gastrointestinal:  Negative for abdominal pain.  Psychiatric/Behavioral:  Negative for confusion.     Vital Signs: BP 112/71   Pulse (!) 59   Temp 98.3 F (36.8 C) (Oral)   Resp 16   Ht 6\' 1"  (1.854 m)   Wt 274 lb (124.3 kg)   SpO2 100%   BMI 36.15 kg/m     Physical Exam HENT:     Head: Normocephalic.     Mouth/Throat:     Mouth: Mucous membranes are moist.     Pharynx: Oropharynx is clear.  Cardiovascular:     Rate and Rhythm: Normal rate and regular rhythm.  Pulmonary:     Effort: Pulmonary effort is normal.     Breath sounds: Normal breath sounds.  Abdominal:     Palpations: Abdomen is soft.     Tenderness: There is no abdominal tenderness.  Musculoskeletal:     Cervical back: Normal range of motion.  Skin:    General: Skin is warm.  Neurological:     General: No focal deficit present.     Mental Status: He is alert and oriented to person, place, and time.  Psychiatric:        Thought Content: Thought content normal.     Imaging: No results found.  Labs:  CBC: Recent Labs    06/26/23 0923  08/14/23 1127 10/07/23 2240 10/07/23 2248 11/03/23 1238  WBC 2.2* 3.7* 4.1  --  3.2*  HGB 11.1* 9.7* 11.9* 13.3 12.3*  HCT 34.9* 30.8* 37.8* 39.0 38.1*  PLT 173 285 146*  --  168    COAGS: Recent Labs    01/31/23 1521  INR 1.2    BMP: Recent Labs    07/09/23 1406 08/14/23 1127 10/07/23 2240 10/07/23 2248 11/03/23 1238  NA 137 136 134* 139 138  K 4.8 4.0 3.8 3.7 4.0  CL 103 104 102  103 104  CO2 24 26 23   --  28  GLUCOSE 94 100* 114* 116* 97  BUN 7 10 12 12 9   CALCIUM 9.6 9.1 8.9  --  9.5  CREATININE 0.97 1.00 0.99 1.20 1.04  GFRNONAA >60 >60 >60  --  >60    LIVER FUNCTION TESTS: Recent Labs    06/26/23 0923 08/14/23 1127 10/07/23 2240 11/03/23 1238  BILITOT 1.1 0.7 1.5* 1.2*  AST 20 16 22 19   ALT 16 11 20 18   ALKPHOS 108 77 80 85  PROT 7.0 7.6 7.4 7.6  ALBUMIN 4.2 4.0 4.2 4.4    TUMOR MARKERS: No results for input(s): "AFPTM", "CEA", "CA199", "CHROMGRNA" in the last 8760 hours.  Assessment and Plan:  Patient is scheduled today for a left axillary lymph node biopsy.   Risks and benefits of left axillary lymph node biopsy was discussed with the patient and/or patient's family including, but not limited to bleeding, infection, damage to adjacent structures or low yield requiring additional tests.  All of the questions were answered and there is agreement to proceed.  Consent signed and in chart.  Thank you for this interesting consult.  I greatly enjoyed meeting OMARIE GILBRETH and look forward to participating in their care.  A copy of this report was sent to the requesting provider on this date.  Electronically Signed: Rosalita Levan, PA 12/02/2023, 11:33 AM   I spent a total of   15 Minutes in face to face in clinical consultation, greater than 50% of which was counseling/coordinating care for left axillary lymph node biopsy.

## 2023-12-04 ENCOUNTER — Other Ambulatory Visit: Payer: Self-pay

## 2023-12-09 ENCOUNTER — Telehealth: Payer: Self-pay

## 2023-12-09 NOTE — Telephone Encounter (Signed)
[facility] CSW Progress Note  Clinical Social Worker  attempted to reach patient  to remind him of deadline for documents to submit for grant application. As of date, CSW has not received requested documentation. CSW was unable to get through via telephone, and sent patient a mychart message. CSW is unable to submit for grant on patient's behalf if documents are not received by 12/30  Marguerita Merles, LCSW Clinical Social Worker [facility]

## 2023-12-11 ENCOUNTER — Other Ambulatory Visit: Payer: Self-pay

## 2023-12-11 ENCOUNTER — Telehealth: Payer: Self-pay | Admitting: *Deleted

## 2023-12-11 NOTE — Telephone Encounter (Signed)
Its unlikely chemorx related at this time.. could be radiculopathy from slipped disc from fall or neuropathy related to hiv..should followup with pcp for further evaluation.   Notified patient of message above from Dr Candise Che

## 2023-12-11 NOTE — Telephone Encounter (Signed)
Pt states he has been having neuropathic pain going down his right leg. He has had pain X 1/12 months, but has been worse for the last 2 weeks. Discussed at last visit in November with Dr Candise Che and they discussed him walking more and keeping well hydrated. He has been exercising at his brother's home gym. States right leg gave out on him this morning and he fell. Was able to get back up. Wants to know what he can do about the discomfort. Has tried topical pain relieving creams without relief.

## 2023-12-11 NOTE — Progress Notes (Signed)
Specialty Pharmacy Refill Coordination Note  Logan Ward is a 42 y.o. male contacted today regarding refills of specialty medication(s) Bictegravir-Emtricitab-Tenofov [name]   Patient requested Delivery   Delivery date: 12/15/23   Verified address: [address] [place] [address]   Medication will be filled on 12.27.24.

## 2023-12-12 ENCOUNTER — Other Ambulatory Visit: Payer: Self-pay

## 2023-12-12 ENCOUNTER — Telehealth: Payer: Self-pay | Admitting: *Deleted

## 2023-12-12 NOTE — Telephone Encounter (Signed)
Logan Ward to forms office requesting "Medical records from March 17, 2023 to present be released to [facility] as requested September 25, 2023.  I have not returned to work at [employer].  Know one told me to return to work.  Still having problems from chemotherapy.  Today is the deadline or Long-Term Disability will be suspended if they do not receive records today Problems include back problems and pain.   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  No, I do not need to be seen, was just seen five weeks ago.  Was not told to return to work."   Advised (SW) H.I.M allowed up to thirty calendar days to submit records per [facility] policy.  Obtained newly signed ROI.  This nurse will mark request as urgent.   EMR examined.     (This nurse noted most recent [employer] request [facility] received on 08/29/2023 was cancelled.  Per form tracker, assigned  "Provider unable to sign as patient retuning to work 09/10/2023".  March 2024 received last chemotherapy. Next F/U scheduled 01/05/2024.  (SW) H.I.M received request from [facility] 09/25/2023 completed 10/02/2023 with letter requesting updated ROI, copy of [place] ROI with a blank [employer] Attending Physician Statement (APS) form)       .        Connected with customer service representative "Iman" with [employer] [phone]) regarding LTD claim no.#: [id number] for further information.  Advised to re-fax request addressed to our legal name, "[place] to [phone]"  Above information shared.  Iman reviewed claim.  "Nothing was received by [facility] 10/02/2023.  [employer] sent several requests for medical information with no receipt of records.  The records are needed as soon as possible.  If a provider statement is needed, [employer] will contact the provider's office."   Logan Ward and Logan Ward currently without further questions or needs.

## 2023-12-15 ENCOUNTER — Telehealth: Payer: Self-pay

## 2023-12-15 NOTE — Telephone Encounter (Signed)
[facility] CSW Progress Note  Clinical Social Worker  called patient  to discuss Sport and exercise psychologist. CSW reminded patient to send requested documentation to email by 12/31 to proceed with assistance for Light bill, if not received CSW will only be able to apply for food assistance, Patient verbalized understanding / confirmed address.   Marguerita Merles, LCSW Clinical Social Worker [facility]

## 2023-12-18 ENCOUNTER — Telehealth: Payer: Self-pay

## 2023-12-18 ENCOUNTER — Inpatient Hospital Stay: Payer: 59 | Attending: Hematology

## 2023-12-18 DIAGNOSIS — B20 Human immunodeficiency virus [HIV] disease: Secondary | ICD-10-CM | POA: Insufficient documentation

## 2023-12-18 DIAGNOSIS — C8371 Burkitt lymphoma, lymph nodes of head, face, and neck: Secondary | ICD-10-CM | POA: Insufficient documentation

## 2023-12-18 NOTE — Progress Notes (Signed)
 [facility] CSW Progress Note  Clinical Child Psychotherapist  called patient   to inform him that sport and exercise psychologist for food/transportation assistance with  [employer] was submitted on his behalf as requested . Patient inquired about rental assistance with Organization. Patient did not provide documentation as requested to submit for rental assistance in addition to food/transportation, therefore CSW completed application for food/transportation assistance, as previously discussed. Patient has direct contact if needed.   Lizbeth Sprague, LCSW Clinical Social Worker [facility]

## 2023-12-18 NOTE — Telephone Encounter (Signed)
 [facility] CSW Progress Note  Clinical Child psychotherapist  contacted patient by phone  to inform him of grant approval.   Marguerita Merles, LCSW Clinical Social Worker [facility]

## 2023-12-18 NOTE — Progress Notes (Signed)
 [facility] CSW Progress Note  Addendum note to add  that patient will receive communication from grant.  Marguerita Merles, LCSW Clinical Social Worker [facility]

## 2023-12-23 NOTE — Telephone Encounter (Signed)
 Message left regarding claim number: [id number] of Oralee LITTIE Balloon for his [facility] Case Specialist Tawni Eastern [phone] ext [phone]).  Provided information left with representative on 12-12-2023  Provided fax number to resubmit request for this nurse to send to (SW) H.I.M.

## 2023-12-24 NOTE — Telephone Encounter (Signed)
 Have not yet received return call or new request from [facility] Case Specialist.  Request for medical records faxed to (SW) H.I.M. [phone]) stamped URGENT.  .[employer] has requested records from 03/17/2023 to present effective September 2024.  No further instructions received or actions performed by this nurse.

## 2023-12-29 ENCOUNTER — Ambulatory Visit (HOSPITAL_COMMUNITY)
Admission: RE | Admit: 2023-12-29 | Discharge: 2023-12-29 | Disposition: A | Discharge status: Home / Self Care | Payer: 59 | Source: Ambulatory Visit | Attending: Hematology | Admitting: Hematology

## 2023-12-29 DIAGNOSIS — R599 Enlarged lymph nodes, unspecified: Secondary | ICD-10-CM | POA: Diagnosis not present

## 2023-12-29 DIAGNOSIS — C8370 Burkitt lymphoma, unspecified site: Secondary | ICD-10-CM | POA: Diagnosis not present

## 2023-12-29 DIAGNOSIS — C8378 Burkitt lymphoma, lymph nodes of multiple sites: Secondary | ICD-10-CM | POA: Insufficient documentation

## 2023-12-29 DIAGNOSIS — M47812 Spondylosis without myelopathy or radiculopathy, cervical region: Secondary | ICD-10-CM | POA: Diagnosis not present

## 2023-12-29 DIAGNOSIS — R918 Other nonspecific abnormal finding of lung field: Secondary | ICD-10-CM | POA: Diagnosis not present

## 2023-12-29 DIAGNOSIS — R140 Abdominal distension (gaseous): Secondary | ICD-10-CM | POA: Diagnosis not present

## 2023-12-29 MED ORDER — IOHEXOL 300 MG/ML  SOLN
100.0000 mL | Freq: Once | INTRAMUSCULAR | Status: AC | PRN
  Administered 2023-12-29: 100 mL via INTRAVENOUS

## 2023-12-30 ENCOUNTER — Other Ambulatory Visit: Payer: Self-pay

## 2023-12-31 ENCOUNTER — Encounter: Payer: Self-pay | Admitting: *Deleted

## 2024-01-01 ENCOUNTER — Other Ambulatory Visit: Payer: Self-pay

## 2024-01-01 ENCOUNTER — Encounter: Payer: Self-pay | Admitting: *Deleted

## 2024-01-01 DIAGNOSIS — C8378 Burkitt lymphoma, lymph nodes of multiple sites: Secondary | ICD-10-CM

## 2024-01-02 ENCOUNTER — Encounter: Payer: Self-pay | Admitting: Hematology

## 2024-01-05 ENCOUNTER — Encounter (HOSPITAL_COMMUNITY): Payer: Self-pay

## 2024-01-05 ENCOUNTER — Inpatient Hospital Stay: Payer: 59

## 2024-01-05 ENCOUNTER — Other Ambulatory Visit (HOSPITAL_COMMUNITY): Payer: Self-pay

## 2024-01-05 ENCOUNTER — Inpatient Hospital Stay (HOSPITAL_BASED_OUTPATIENT_CLINIC_OR_DEPARTMENT_OTHER): Payer: 59 | Admitting: Hematology

## 2024-01-05 ENCOUNTER — Other Ambulatory Visit: Payer: Self-pay

## 2024-01-05 VITALS — BP 124/74 | HR 63 | Temp 97.5°F | Resp 17 | Wt 297.5 lb

## 2024-01-05 DIAGNOSIS — C8378 Burkitt lymphoma, lymph nodes of multiple sites: Secondary | ICD-10-CM | POA: Diagnosis not present

## 2024-01-05 DIAGNOSIS — C8371 Burkitt lymphoma, lymph nodes of head, face, and neck: Secondary | ICD-10-CM | POA: Diagnosis not present

## 2024-01-05 DIAGNOSIS — M259 Joint disorder, unspecified: Secondary | ICD-10-CM

## 2024-01-05 DIAGNOSIS — B20 Human immunodeficiency virus [HIV] disease: Secondary | ICD-10-CM | POA: Diagnosis not present

## 2024-01-05 LAB — CBC WITH DIFFERENTIAL (CANCER CENTER ONLY)
Abs Immature Granulocytes: 0.02 10*3/uL (ref 0.00–0.07)
Basophils Absolute: 0 10*3/uL (ref 0.0–0.1)
Basophils Relative: 1 %
Eosinophils Absolute: 0 10*3/uL (ref 0.0–0.5)
Eosinophils Relative: 1 %
HCT: 38.8 % — ABNORMAL LOW (ref 39.0–52.0)
Hemoglobin: 12.7 g/dL — ABNORMAL LOW (ref 13.0–17.0)
Immature Granulocytes: 1 %
Lymphocytes Relative: 67 %
Lymphs Abs: 3 10*3/uL (ref 0.7–4.0)
MCH: 27.7 pg (ref 26.0–34.0)
MCHC: 32.7 g/dL (ref 30.0–36.0)
MCV: 84.7 fL (ref 80.0–100.0)
Monocytes Absolute: 0.3 10*3/uL (ref 0.1–1.0)
Monocytes Relative: 8 %
Neutro Abs: 1 10*3/uL — ABNORMAL LOW (ref 1.7–7.7)
Neutrophils Relative %: 22 %
Platelet Count: 145 10*3/uL — ABNORMAL LOW (ref 150–400)
RBC: 4.58 MIL/uL (ref 4.22–5.81)
RDW: 13.7 % (ref 11.5–15.5)
WBC Count: 4.4 10*3/uL (ref 4.0–10.5)
nRBC: 0 % (ref 0.0–0.2)

## 2024-01-05 LAB — CMP (CANCER CENTER ONLY)
ALT: 15 U/L (ref 0–44)
AST: 14 U/L — ABNORMAL LOW (ref 15–41)
Albumin: 4.3 g/dL (ref 3.5–5.0)
Alkaline Phosphatase: 80 U/L (ref 38–126)
Anion gap: 4 — ABNORMAL LOW (ref 5–15)
BUN: 11 mg/dL (ref 6–20)
CO2: 27 mmol/L (ref 22–32)
Calcium: 9.1 mg/dL (ref 8.9–10.3)
Chloride: 106 mmol/L (ref 98–111)
Creatinine: 0.99 mg/dL (ref 0.61–1.24)
GFR, Estimated: >60 mL/min (ref >60)
Glucose, Bld: 83 mg/dL (ref 70–99)
Potassium: 4 mmol/L (ref 3.5–5.1)
Sodium: 137 mmol/L (ref 135–145)
Total Bilirubin: 1.3 mg/dL — ABNORMAL HIGH (ref 0.0–1.2)
Total Protein: 7.3 g/dL (ref 6.5–8.1)

## 2024-01-05 MED ORDER — SODIUM CHLORIDE 0.9% FLUSH
10.0000 mL | Freq: Once | INTRAVENOUS | Status: AC
  Administered 2024-01-05: 10 mL

## 2024-01-05 MED ORDER — HEPARIN SOD (PORK) LOCK FLUSH 100 UNIT/ML IV SOLN
500.0000 [IU] | Freq: Once | INTRAVENOUS | Status: AC
  Administered 2024-01-05: 500 [IU]

## 2024-01-05 NOTE — Progress Notes (Signed)
Specialty Pharmacy Refill Coordination Note  Logan Ward is a 43 y.o. male contacted today regarding refills of specialty medication(s) Bictegravir-Emtricitab-Tenofov [name]   Patient requested Delivery   Delivery date: 01/13/24   Verified address: [address] [place], [place] [address]  Previously scheduled as pick-up but patient requested delivery at clinical assessment. He was unaware prescription was ready to pick up at [facility].  Medication will be filled on 01/12/2024.

## 2024-01-05 NOTE — Progress Notes (Signed)
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  The patient presented with a rapidly growing right cervical nodal mass extending into the level 2 nodal station of the right supraclavicular nodal station and extension into the superficial right sternocleidomastoid muscle.  There is also hypermetabolic right axillary nodal disease.  No evidence of spleen or solid organ involvement.  No evidence of marrow involvement.  The patient was diagnosed in November/December 2023  HISTORY OF PRESENTING ILLNESS:  Logan Ward is a wonderful 43 y.o. male who has been referred to [place] by Dr Hanley Ben MD for evaluation and management of newly diagnosed Burkitt's lymphoma in the setting of known history of HIV/AIDS.   Patient has a history of HIV/AIDS [last CD4 count on 10/21/2022 of 501 and viral load of 324 copies per mL, acquired through by sexual contact, diagnosed 3 to 4 months ago, follows with [name] Calone], syphilis, obesity presented to the hospital on 11/10/2022 with rapidly growing right anterior chest wall mass.   CT of the neck on 11/10/2022 showed large infiltrative centrally necrotic soft tissue mass throughout the right neck and extending to the upper chest anterior to the clavicle measuring 8.1 x 5.9 x 7.4 cm in size.  There are numerous additional prominent right sided cervical chain lymph nodes measuring 1 cm at level 2A and 1.1 cm in the posterior triangle. CT chest abdomen pelvis done on 11/10/2022 showed enlarged right lower cervical, supraclavicular and bilaterally axillary lymph nodes.  Largest right axillary lymph node measuring 2.2 x 1.7 cm.  Diffuse fat stranding throughout the superior mediastinum.  Prominent subcentimeter retroperitoneal and iliac lymph nodes.  Diffuse retroperitoneal fat stranding of uncertain significance.  No evidence of mass or evidence of organ  metastatic disease in the abdomen or pelvis.  Patient had an ultrasound-guided core needle biopsy of the right neck mass on 11/12/2022 which shows B-cell non-Hodgkin's lymphoma with high-grade features and findings consistent with Burkitt's lymphoma.    PRIOR THERAPY: EPOCH-R x 6 cycles   INTERVAL HISTORY:  Logan Ward 43 y.o. male with Burkitt's lymphoma who is being followed-up for toxicity check.  Patient was last seen by me on 11/03/2023 and he complained of left upper/lower extremities soreness, but was doing well overall.   Patient is accompanied by his mother during this visit. Patient notes he has been doing well overall since our last visit. He complains of right upper leg weakness when we walks and bilateral hand neuropathy with hot sensation. He denies any injuries to his lower extremities.   He denies physical exercise. He has not been exercising due to right leg weakness. He has been eating well.   Patient has been to his PCP regarding right upper leg weakness.   He denies any new infection issues, fever, chills, night sweats, unexpected weight loss, back pain, chest pain, new lumps/bumps, abdominal pain, or leg swelling. Patient had sore throat and enlarged lymph nodes on his neck around 2 weeks ago. During this visit, he notes that the lymph nodes have shrunk in size. He does endorse mild occasional swallowing issues with some food.   Patient endorses throbbing headache once every week, which improves after he takes tylenol. He does complain of chronic mild SOB.  Patient had 5-6 sessions of physical therapy after his treatment.  Patient notes that he felt like his "knee gave out" around 2 weeks ago.  MEDICAL HISTORY: 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)    HIV infection (HCC)    Internal hemorrhoids    Obesity    Rectal ulcer     ALLERGIES:  has no known allergies.  MEDICATIONS:   Current Outpatient Medications  Medication Sig Dispense Refill   B Complex-C (B-COMPLEX WITH VITAMIN C) tablet Take 1 tablet by mouth daily. 30 tablet 11   bictegravir-emtricitabine-tenofovir AF (BIKTARVY) 50-200-25 MG TABS tablet Take 1 tablet by mouth daily. 30 tablet 5   HYDROcodone-acetaminophen (NORCO/VICODIN) 5-325 MG tablet Take 1 tablet by mouth every 6 (six) hours as needed. 12 tablet 0   ibuprofen (ADVIL) 600 MG tablet Take 1 tablet (600 mg total) by mouth every 6 (six) hours as needed. 30 tablet 0   ketoconazole (NIZORAL) 2 % cream Apply topically 2 times daily for 2 weeks (Patient not taking: Reported on 09/26/2023) 60 g 1   No current facility-administered medications for this visit.    SURGICAL HISTORY:  Past Surgical History:  Procedure Laterality Date   IR IMAGING GUIDED PORT INSERTION  11/18/2022    REVIEW OF SYSTEMS:    10 Point review of Systems was done is negative except as noted above.   PHYSICAL EXAMINATION: PHONE VISIT .BP 124/74 (BP Location: Left Arm, Patient Position: Sitting)   Pulse 63   Temp (!) 97.5 F (36.4 C) (Temporal)   Resp 17   Wt 297 lb 8 oz (134.9 kg)   SpO2 100%   BMI 39.25 kg/m   GENERAL:alert, in no acute distress and comfortable SKIN: no acute rashes, no significant lesions EYES: conjunctiva are pink and non-injected, sclera anicteric OROPHARYNX: MMM, no exudates, no oropharyngeal erythema or ulceration NECK: supple, no JVD LYMPH:  no palpable lymphadenopathy in the cervical, axillary or inguinal regions LUNGS: clear to auscultation b/l with normal respiratory effort HEART: regular rate & rhythm ABDOMEN:  normoactive bowel sounds , non tender, not distended. Extremity: no pedal edema PSYCH: alert & oriented x 3 with fluent speech NEURO: no focal motor/sensory deficits   ECOG PERFORMANCE STATUS: 1   LABORATORY DATA:  .[name]    Latest Ref Rng & Units 01/05/2024   12:58 PM 11/03/2023   12:38 PM 10/07/2023   10:48 PM  CBC   WBC 4.0 - 10.5 K/uL 4.4  3.2    Hemoglobin 13.0 - 17.0 g/dL 16.1  09.6  04.5   Hematocrit 39.0 - 52.0 % 38.8  38.1  39.0   Platelets 150 - 400 K/uL 145  168    ANC 400 .    Latest Ref Rng & Units 01/05/2024   12:58 PM 11/03/2023   12:38 PM 10/07/2023   10:48 PM  CMP  Glucose 70 - 99 mg/dL 83  97  409   BUN 6 - 20 mg/dL 11  9  12    Creatinine 0.61 - 1.24 mg/dL 8.11  9.14  7.82   Sodium 135 - 145 mmol/L 137  138  139   Potassium 3.5 - 5.1 mmol/L 4.0  4.0  3.7   Chloride 98 - 111 mmol/L 106  104  103   CO2 22 - 32 mmol/L 27  28    Calcium 8.9 - 10.3 mg/dL 9.1  9.5    Total Protein 6.5 - 8.1 g/dL 7.3  7.6    Total Bilirubin 0.0 - 1.2 mg/dL 1.3  1.2    Alkaline Phos 38 -  126 U/L 80  85    AST 15 - 41 U/L 14  19    ALT 0 - 44 U/L 15  18       RADIOGRAPHIC STUDIES:  CT Soft Tissue Neck W Contrast Result Date: 01/07/2024 CLINICAL DATA:  Burkitt lymphoma.  Interval follow-up. EXAM: CT NECK WITH CONTRAST TECHNIQUE: Multidetector CT imaging of the neck was performed using the standard protocol following the bolus administration of intravenous contrast. RADIATION DOSE REDUCTION: This exam was performed according to the departmental dose-optimization program which includes automated exposure control, adjustment of the mA and/or kV according to patient size and/or use of iterative reconstruction technique. CONTRAST:  OMNIPAQUE IOHEXOL 300 MG/ML  SOLN COMPARISON:  11/10/2022 FINDINGS: Pharynx and larynx: No evidence of mucosal or submucosal mass lesion or inflammatory change. Salivary glands: Parotid and submandibular glands are normal. Thyroid: Normal Lymph nodes: Resolution of the large indistinctly marginated mass lesion throughout the anterior lower neck, more extensive on the right than the left. Mild scarring of the right sternocleidomastoid muscle but no evidence of recurrent mass lesion. Stable appearance of level 1 lymph nodes, the largest showing short axis dimension of 1 cm.  Previously seen mass/adenopathy in the right level 2 region has resolved without residual. Other cervical chain lymph nodes on both sides of the neck are within normal limits in size. Vascular: No abnormal vascular finding. Limited intracranial: Normal Visualized orbits: Normal Mastoids and visualized paranasal sinuses: Clear Skeleton: Minimal cervical spondylosis. Upper chest: Lung apices are clear. No visible superior mediastinal lesion. Port-A-Cath remains in place. Other: None IMPRESSION: 1. Resolution of the large indistinctly marginated mass lesion throughout the anterior lower neck, previously more extensive on the right than the left. Mild scarring of the right sternocleidomastoid muscle but no evidence of recurrent mass lesion. 2. Stable appearance of level 1 lymph nodes, the largest showing short axis dimension of 1 cm. Previously seen mass/adenopathy in the right level 2 region has resolved without residual. Other cervical chain lymph nodes on both sides of the neck are within normal limits in size. Electronically Signed   By: Paulina Fusi M.D.   On: 01/07/2024 15:15   CT CHEST ABDOMEN PELVIS W CONTRAST Result Date: 12/29/2023 CLINICAL DATA:  Hematologic malignancy, assess treatment response. * Tracking Code: BO * EXAM: CT CHEST, ABDOMEN, AND PELVIS WITH CONTRAST TECHNIQUE: Multidetector CT imaging of the chest, abdomen and pelvis was performed following the standard protocol during bolus administration of intravenous contrast. RADIATION DOSE REDUCTION: This exam was performed according to the departmental dose-optimization program which includes automated exposure control, adjustment of the mA and/or kV according to patient size and/or use of iterative reconstruction technique. CONTRAST:  OMNIPAQUE IOHEXOL 300 MG/ML  SOLN COMPARISON:  Multiple priors including most recent CT October 07, 2023 and PET-CT April 04, 2023 FINDINGS: CT CHEST FINDINGS Cardiovascular: Left chest Port-A-Cath with tip at  the superior cavoatrial junction. Gas in the pulmonary outflow tract related to vascular access. Normal size heart. No significant pericardial effusion/thickening. Mediastinum/Nodes: No pathologically enlarged mediastinal, hilar or axillary lymph nodes. The esophagus is grossly unremarkable. Lungs/Pleura: Stable 2 mm right perifissural pulmonary nodule on image 81/5. No suspicious pulmonary nodules or masses. Hypoventilatory change in the dependent lungs. No pleural effusion. No pneumothorax. Musculoskeletal: No aggressive lytic or blastic lesion of bone. CT ABDOMEN PELVIS FINDINGS Hepatobiliary: No suspicious hepatic lesion. Gallbladder is unremarkable. No biliary ductal dilation. Pancreas: No pancreatic ductal dilation or evidence of acute inflammation. Spleen: No splenomegaly. Adrenals/Urinary Tract: Bilateral  adrenal glands appear normal. No hydronephrosis. Kidneys demonstrate symmetric enhancement. Urinary bladder is unremarkable for degree of distension. Stomach/Bowel: Stomach is distended with ingested material and gas without focal wall thickening. No pathologic dilation of large or small bowel. No evidence of acute bowel inflammation. Vascular/Lymphatic: Normal caliber abdominal aorta. Smooth IVC contours. The portal, splenic and superior mesenteric veins are patent. No pathologically enlarged abdominal or pelvic lymph nodes. Reproductive: Prostate is unremarkable. Other: No significant abdominopelvic free fluid. Musculoskeletal: No aggressive lytic or blastic lesion of bone. IMPRESSION: 1. No pathologically enlarged lymph nodes in the chest, abdomen or pelvis. 2. No splenomegaly. Electronically Signed   By: Maudry Mayhew M.D.   On: 12/29/2023 16:55     ASSESSMENT/PLAN:   43 year old male with HIV/AIDS with newly diagnosed Burkitt's lymphoma   #1  Stage II recently diagnosed Burkitt's lymphoma Presenting with rapidly growing right neck mass extending into the chest, bilateral x-ray lymphadenopathy  and also concern for possible involvement of retroperitoneal lymph nodes. Concerning for at least stage II possibly stage III disease. Some borderline lymphadenopathy in the abdomen could also be from his recently diagnosed HIV/AIDS. No clinical symptoms suggestive of CNS involvement at this time. Brain MRI negative.  No constitutional symptoms. No significant cytopenias to suggest bone marrow involvement. Discussed with patient and he is not interested in fertility preservation or sperm banking. First presented in August at [facility] and was treated empirically with steroids and antibiotics with improvement in swelling prior to additional progression. -Currently on dose adjusted EPOCH-R. Status post 1 cycle. Tolerated well overall.  -Scheduled for cycle #2 on 12/10/22. Will help facilitate inpatient admission. -Outpatient follow up on 12/17/22 for Rituxan and Neulasta   #2 HIV/AIDS -Following Dr. Carver Fila from ID and currently taking Biktarvy.   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  Patient reports this was completely treated. Recent with secondary syphilis   #4 Prophylaxis: Continue salt water and baking soda mouthwash rinses 4 times a day to reduce mucositis.  MiraLAX and senna as for constipation Bactrim for prophylactic PJP.    PLAN: -Discussed lab results from today, 01/05/2024, in detail with the patient.  CBC shows slightly low hemoglobin of 12.7 g/dL with hematocrit of 16.1% and low platelet counts of 145 K. CMP pending. Chronic neutropenia with HIV medication.  -Discussed CT Chest Abdomen Pelvis results from 12/29/2023 in detail with the patient. No pathologically enlarged lymph nodes in the chest, abdomen or pelvis. No splenomegaly. -CT Soft Tissue neck results pending from 12/29/2023. Resulted later-- no evidence of lymphoma. -Discussed [place] of left axilla results from 12/02/2023. Showed Normal appearing left axillary lymph nodes. No biopsy.  -Answered all of  patient's questions.  -Continue B-Complex.  -Recommend to exercise regularly.  -Discussed the option of getting referred to orthopedic. Patient agrees.    FOLLOW-UP: -Referral to orthopedics for "rt knee giving way" ? Knee issues vs radiculopathy with muscle weakness -RTC with Dr Candise Che with labs in 3 months  The total time spent in the appointment was 30 minutes* .  All of the patient's questions were answered with apparent satisfaction. The patient knows to call the clinic with any problems, questions or concerns.   Wyvonnia Lora MD MS AAHIVMS [facility] Hematology/Oncology Physician [facility]  .*Total Encounter Time as defined by the Centers for Medicare and Medicaid Services includes, in addition to the face-to-face time of a patient visit (documented in the note above) non-face-to-face time: obtaining and reviewing outside history,  ordering and reviewing medications, tests or procedures, care coordination (communications with other health care professionals or caregivers) and documentation in the medical record.   I,Param Shah,acting as a Neurosurgeon for Wyvonnia Lora, MD.,have documented all relevant documentation on the behalf of Wyvonnia Lora, MD,as directed by  Wyvonnia Lora, MD while in the presence of Wyvonnia Lora, MD.   .I have reviewed the above documentation for accuracy and completeness, and I agree with the above. Johney Maine MD

## 2024-01-06 ENCOUNTER — Telehealth: Payer: Self-pay | Admitting: Hematology

## 2024-01-06 NOTE — Telephone Encounter (Signed)
Spoke with patient confirming upcoming appointments  

## 2024-01-07 ENCOUNTER — Other Ambulatory Visit (HOSPITAL_COMMUNITY): Payer: Self-pay

## 2024-01-07 ENCOUNTER — Other Ambulatory Visit: Payer: Self-pay

## 2024-01-07 NOTE — Progress Notes (Signed)
Specialty Pharmacy Ongoing Clinical Assessment Note  Logan Ward is a 43 y.o. male who is being followed by the specialty pharmacy service for RxSp HIV   Patient's specialty medication(s) reviewed today: No data recorded  Missed doses in the last 4 weeks: 0   Patient/Caregiver did not have any additional questions or concerns.   Therapeutic benefit summary: Patient is achieving benefit   Adverse events/side effects summary: No adverse events/side effects   Patient's therapy is appropriate to: Continue    Goals Addressed             This Visit's Progress    Achieve Undetectable HIV Viral Load < 20       Patient is on track. Patient will maintain adherence         Follow up:  6 months  Delylah Stanczyk E [name] Specialty Pharmacist

## 2024-01-08 ENCOUNTER — Other Ambulatory Visit: Payer: Self-pay

## 2024-01-09 ENCOUNTER — Ambulatory Visit: Payer: 59 | Admitting: Family

## 2024-01-09 ENCOUNTER — Other Ambulatory Visit: Payer: Self-pay

## 2024-01-11 ENCOUNTER — Encounter: Payer: Self-pay | Admitting: Hematology

## 2024-01-12 ENCOUNTER — Other Ambulatory Visit: Payer: Self-pay

## 2024-01-21 ENCOUNTER — Encounter: Payer: Self-pay | Admitting: *Deleted

## 2024-01-21 ENCOUNTER — Other Ambulatory Visit (HOSPITAL_COMMUNITY): Payer: Self-pay

## 2024-01-26 NOTE — Progress Notes (Signed)
Office Visit Note   Patient: Logan Ward           Date of Birth: [birth year]           MRN: [id number] Visit Date: 01/27/2024              Requested by: Johney Maine, MD [address] [place],  [place] [address] PCP: Johney Maine, MD   Assessment & Plan: Visit Diagnoses:  1. Pain in right leg     Plan: Patient is a 43 year old gentleman with a right leg pain and subjective weakness.  History of lymphoma that has been treated with 7 months of chemotherapy.  He has had an MRI of the lumbar spine which showed some foraminal stenosis at L5-S1 but exam is not consistent with the physical exam findings.  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  I would recommend physical therapy for general strengthening.  He could consider lumbar spine epidural steroid injection but am not convinced that this would be very effective.  Otherwise would like to refer him back to his oncologist for further treatment.  Total face to face encounter time was greater than 45 minutes and over half of this time was spent in counseling and/or coordination of care.   Follow-Up Instructions: No follow-ups on file.   Orders:  Orders Placed This Encounter  Procedures   XR Lumbar Spine 2-3 Views   XR HIP UNILAT W OR W/O PELVIS 2-3 VIEWS RIGHT   No orders of the defined types were placed in this encounter.     Procedures: No procedures performed   Clinical Data: No additional findings.   Subjective: Chief Complaint  Patient presents with   Right Leg - Pain    HPI patient is a very pleasant 43 year old gentleman who comes in today with right thigh pain.  Symptoms have been ongoing for many months.  No injury.  He does have a history of Burkitt's lymphoma.  The pain he has is to the anterior and posterior thigh.  No pain into the groin or distal to the knee.  He describes this as a constant throb worse with walking as well as a change in weather.  He does note  weakness in the right lower extremity.  He takes an occasional Tylenol.  He denies any bowel or bladder change or saddle paresthesias.  He tells me he does not have a history of lumbar pathology.  I was able to find an MRI of the lumbar spine in [place] from March 2024 which showed degenerative changes L4-5 and L5-S1 with mild to moderate right and mild left foraminal stenosis L5-S1.  Review of Systems as detailed in HPI.  All others reviewed and are negative.   Objective: Vital Signs: There were no vitals taken for this visit.  Physical Exam well-developed well-nourished gentleman in no acute distress.  Alert and oriented x 3.  Ortho Exam lumbar spine exam: No spinous or paraspinous tenderness.  No pain with lumbar flexion or extension.  He does have pain with straight leg raise.  No pain with logroll, FADIR testing.  He does have slight weakness with resisted knee extension and flexion but hard to tell whether this is pain mediated.  He is neurovascularly intact distally.  He has weakness to hip flexion, knee extension, ankle dorsiflexion.  Normal patellar tendon reflexes.  No clonus.  Specialty Comments:  No specialty comments available.  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   Neutropenia (HCC) 06/26/2023   Rash 06/26/2023   Intractable back pain suspect Granix-related 02/21/2023   Lactic acidosis 02/21/2023   Thrombocytopenia (HCC) 02/01/2023   Hyponatremia 01/31/2023   Hypokalemia 01/23/2023   Hematuria 01/11/2023   Neutropenic fever (HCC) 01/10/2023   Pancytopenia (HCC) 01/10/2023   Acute cystitis 01/10/2023   Anemia 01/01/2023   Healthcare maintenance 12/19/2022   Encounter for chemotherapy management  12/12/2022   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HCC) 12/10/2022   Decreased appetite 12/02/2022   Drug-induced constipation 11/21/2022   Nausea without vomiting 11/21/2022   Acute nonintractable headache 11/21/2022   Encounter for antineoplastic chemotherapy 11/18/2022   Burkitt lymphoma of lymph nodes of multiple regions [facility]) 11/14/2022   Counseling regarding advance care planning and goals of care 11/14/2022   Chest mass 11/10/2022   Exposure to chlamydia 10/24/2022   Submental lymphadenopathy 09/05/2022   Goals of care, counseling/discussion 07/26/2022   History of syphilis 07/26/2022   Neck swelling 07/26/2022   AIDS (acquired immune deficiency syndrome) (HCC) 07/25/2022   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)    HIV infection (HCC)    Internal hemorrhoids    Obesity    Rectal ulcer     Family History  Problem Relation Age of Onset   Hypertension Father    Throat cancer Father    Heart attack Brother    Colon cancer Neg Hx    Esophageal cancer Neg Hx    Stomach cancer Neg Hx     Past Surgical History:  Procedure Laterality Date   IR IMAGING GUIDED PORT INSERTION  11/18/2022   Social History   Occupational History   Not on file  Tobacco Use   Smoking status: Never   Smokeless tobacco: Never  Vaping Use   Vaping status: Never Used  Substance and Sexual Activity   Alcohol use: No   Drug use: No   Sexual activity: Yes    Comment: declined condoms

## 2024-01-27 ENCOUNTER — Other Ambulatory Visit (INDEPENDENT_AMBULATORY_CARE_PROVIDER_SITE_OTHER): Payer: Self-pay

## 2024-01-27 ENCOUNTER — Other Ambulatory Visit: Payer: Self-pay

## 2024-01-27 ENCOUNTER — Other Ambulatory Visit (INDEPENDENT_AMBULATORY_CARE_PROVIDER_SITE_OTHER): Payer: 59

## 2024-01-27 ENCOUNTER — Ambulatory Visit (INDEPENDENT_AMBULATORY_CARE_PROVIDER_SITE_OTHER): Payer: 59 | Admitting: Orthopaedic Surgery

## 2024-01-27 DIAGNOSIS — M79604 Pain in right leg: Secondary | ICD-10-CM

## 2024-01-27 DIAGNOSIS — C8378 Burkitt lymphoma, lymph nodes of multiple sites: Secondary | ICD-10-CM

## 2024-01-28 ENCOUNTER — Ambulatory Visit: Payer: 59 | Attending: Hematology | Admitting: Physical Therapy

## 2024-01-28 ENCOUNTER — Encounter: Payer: Self-pay | Admitting: Physical Therapy

## 2024-01-28 ENCOUNTER — Other Ambulatory Visit: Payer: Self-pay

## 2024-01-28 DIAGNOSIS — R29898 Other symptoms and signs involving the musculoskeletal system: Secondary | ICD-10-CM | POA: Insufficient documentation

## 2024-01-28 DIAGNOSIS — C8378 Burkitt lymphoma, lymph nodes of multiple sites: Secondary | ICD-10-CM | POA: Insufficient documentation

## 2024-01-28 DIAGNOSIS — M6281 Muscle weakness (generalized): Secondary | ICD-10-CM | POA: Insufficient documentation

## 2024-01-28 DIAGNOSIS — R296 Repeated falls: Secondary | ICD-10-CM | POA: Diagnosis present

## 2024-01-28 DIAGNOSIS — R269 Unspecified abnormalities of gait and mobility: Secondary | ICD-10-CM | POA: Insufficient documentation

## 2024-01-28 NOTE — Therapy (Signed)
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)    HIV infection (HCC)    Internal hemorrhoids    Obesity    Rectal ulcer    Past Surgical History:  Procedure Laterality Date   IR IMAGING GUIDED PORT INSERTION  11/18/2022   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   Neutropenia (HCC) 06/26/2023   Rash 06/26/2023   Intractable back pain suspect Granix-related 02/21/2023   Lactic acidosis 02/21/2023   Thrombocytopenia (HCC) 02/01/2023   Hyponatremia 01/31/2023   Hypokalemia 01/23/2023   Hematuria 01/11/2023   Neutropenic fever (HCC) 01/10/2023   Pancytopenia (HCC) 01/10/2023   Acute cystitis 01/10/2023   Anemia 01/01/2023   Healthcare maintenance 12/19/2022   Encounter for chemotherapy management 12/12/2022   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HCC) 12/10/2022   Decreased appetite 12/02/2022   Drug-induced constipation 11/21/2022   Nausea without vomiting 11/21/2022   Acute nonintractable headache 11/21/2022   Encounter for antineoplastic chemotherapy 11/18/2022   Burkitt lymphoma of lymph nodes of multiple regions [facility]) 11/14/2022   Counseling regarding advance care planning and goals of care 11/14/2022   Chest mass 11/10/2022   Exposure to chlamydia 10/24/2022   Submental lymphadenopathy 09/05/2022   Goals of care, counseling/discussion 07/26/2022    History of syphilis 07/26/2022   Neck swelling 07/26/2022   AIDS (acquired immune deficiency syndrome) (HCC) 07/25/2022    PCP: Johney Maine, MD  REFERRING PROVIDER: Johney Maine, MD  REFERRING DIAG: C83.78 (ICD-10-CM) - Burkitt lymphoma of lymph nodes of multiple regions [facility])  THERAPY DIAG:  Muscle weakness (generalized)  Abnormality of gait and mobility  Rationale for Evaluation and Treatment: Rehabilitation  ONSET DATE: 6-7 months  SUBJECTIVE:  SUBJECTIVE STATEMENT: Rt leg weakness and gives out sometimes, cramps throughout the night and sometimes while walking. Uses SPC and this helps a lot with prevention of falling when it gives out.  No pattern with walking or standing time with weakness.  4-5 falls. Does have numbness and tingling in both legs throughout bil thighs Did finish cancer treatments last year, does still port. Chemo completed. Wasn't very active during chemo with exhausted and feels so weak now.    PAIN:  Are you having pain? Yes NPRS scale: 2-10/10 Pain location: bil thighs  Pain type: aching and dull does report sharp with cramping at night Pain description: constant   Aggravating factors: cramping at night is highest pain; walking, standing Relieving factors: sitting to rest  PRECAUTIONS: Other: recent cancer   RED FLAGS: None   WEIGHT BEARING RESTRICTIONS: No  FALLS:  Has patient fallen in last 6 months? Yes. Number of falls 5 (most recent Sunday)  LIVING ENVIRONMENT: Lives with: lives with their family Lives in: House/apartment Stairs: No Has following equipment at home: Single point cane, FWW  OCCUPATION: off work right now, CNA  PLOF: Independent  PATIENT GOALS: to feel stronger and get back to how I was before cancer  PERTINENT  HISTORY:  HIV/AIDs; syphilis, rapidly growing right cervical nodal mass extending into the level 2 nodal station of the right supraclavicular nodal station and extension into the superficial right sternocleidomastoid muscle.  There is also hypermetabolic right axillary nodal disease.diagnosed in November/December 2023 Sexual abuse: NO  NEXT MD VISIT: 04/05/24 - oncologist  OBJECTIVE:  Note: Objective measures were completed at Evaluation unless otherwise noted.  DIAGNOSTIC FINDINGS:   Xray - hip 01/27/24 Mild degenerative changes to the hip joint  CT NECK WITH CONTRAST 12/28/23  IMPRESSION: 1. Resolution of the large indistinctly marginated mass lesion throughout the anterior lower neck, previously more extensive on the right than the left. Mild scarring of the right sternocleidomastoid muscle but no evidence of recurrent mass lesion. 2. Stable appearance of level 1 lymph nodes, the largest showing short axis dimension of 1 cm. Previously seen mass/adenopathy in the right level 2 region has resolved without residual. Other cervical chain lymph nodes on both sides of the neck are within normal limits in size.   PATIENT SURVEYS:  LEFS Lower Extremity Functional Score: 22 / 80 = 27.5 %  indicative of moderate functional limitation   COGNITION: Overall cognitive status: Within functional limits for tasks assessed     SENSATION: Numbness in both thighs worse with pain, can feel pressure but light touch   POSTURE: rounded shoulders and anterior pelvic tilt  PALPATION: TTP at Rt greater trochanter, tightness noted in Rt hip flexor, Rt lumbar paraspinal  LOWER EXTREMITY ROM:  WFL without pain for full flexion however at end range extension pt reports pain at proximal lateral hip on Rt  LOWER EXTREMITY MMT:  MMT Right Eval */5 Left eval  Hip flexion 3 4+  Hip extension 3+ 4+  Hip abduction 3+ 4  Hip adduction 3+ 4+  Hip internal rotation    Hip external rotation    Knee  flexion 3+ 4  Knee extension 3 4  Ankle dorsiflexion 3+ 4+  Ankle plantarflexion    Ankle inversion    Ankle eversion     (Blank rows = not tested)   FUNCTIONAL TESTS:  5 times sit to stand: 24.06s with use of hands and felt 7/10 on BORG with this  GAIT: Distance walked: 150' Assistive device  utilized: Single point cane Level of assistance: Modified independence Comments: greatly decreased cadence, decreased step length and height at Rt leg                                                                                                                                TREATMENT DATE:  01/28/24 Examination completed, findings reviewed, pt educated on POC, HEP. Pt motivated to participate in PT and agreeable to attempt recommendations.     PATIENT EDUCATION:  Education details: [email or web address] Person educated: Patient Education method: Explanation, Demonstration, Tactile cues, Verbal cues, and Handouts Education comprehension: verbalized understanding, returned demonstration, verbal cues required, tactile cues required, and needs further education  HOME EXERCISE PROGRAM: [email or web address]  ASSESSMENT:  CLINICAL IMPRESSION: Patient is a 43 y.o. male  who was seen today for physical therapy evaluation and treatment for Rt leg weakness. Pt has complex history of Burkitt lymphoma with chemo treatments now completed. Pt reports he was exhausted during his treatments and feels like he is very weak and fatigued now. Does have numbness in bil thighs only posterior and anteriorly and feels like his Rt leg "gives out" to the point of him having 5 falls in the last several months. Pt does demonstrate gait deficits, very slow cadence and heavy trunk lean onto [facility] which pt uses on RT hand reporting he has more able to catch himself with the cane him this hand if his knee "gives". Pt found to have decreased strength bil LE but worse in Rt leg. Pt demonstrated high fall risk based on 5xSTS and after completing this  was very fatigued and needed several minutes to recover stating 7/10 on BORG scale. LEFS score 22 / 80. 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   OBJECTIVE IMPAIRMENTS: Abnormal gait, decreased activity tolerance, decreased balance, decreased coordination, decreased endurance, decreased knowledge of use of DME, decreased mobility, difficulty walking, decreased strength, increased fascial restrictions, impaired perceived functional ability, increased muscle spasms, impaired flexibility, impaired sensation, improper body mechanics, postural dysfunction, and pain.   ACTIVITY LIMITATIONS: carrying, lifting, bending, sitting, standing, squatting, stairs, transfers, bed mobility, and locomotion level  PARTICIPATION LIMITATIONS: cleaning, laundry, interpersonal relationship, driving, shopping, community activity, occupation, and yard work  PERSONAL FACTORS: Fitness, Time since onset of injury/illness/exacerbation, and 1 comorbidity: medical history   are also affecting patient's functional outcome.   REHAB POTENTIAL: Good  CLINICAL DECISION MAKING: Evolving/moderate complexity  EVALUATION COMPLEXITY: Moderate   GOALS: Goals reviewed with patient? Yes  SHORT TERM GOALS: Target date: 02/25/24 Pt to be I with HEP.  Baseline: Goal status: INITIAL  2.  Pt to report improved LEFS score of 30 to improve toward lowering functional impairment limitations.  Baseline:  Goal status: INITIAL  3.  Pt to demonstrate ability to complete body weight squats without hands for 10 reps to improve LE strength for  tolerance for walking.  Baseline:  Goal status: INITIAL  4.  Pt to demonstrate improved gait mechanics with even step length and height to be able to decreased fall risk with community ambulation.   Baseline:  Goal status: INITIAL   LONG TERM GOALS: Target date: 03/24/24  Pt to be I with advanced HEP.   Baseline:  Goal status: INITIAL  2.  Pt to demonstrate 10# squats without compensatory strategies for improved hip strength for tolerance to return to work Baseline:  Goal status: INITIAL  3.  Pt to report ability to walk with or without AD for at least 30 mins with no increase in pain for tolerance to return to work.  Baseline:  Goal status: INITIAL  4.  Pt to demonstrate 5xSTS in 7s to achieve age related norm for decreased fall risk Baseline:  Goal status: INITIAL  5.  Pt to demonstrate at least 4+/5 bil hip strength for improved pelvic stability and functional squats without leakage.  Baseline:  Goal status: INITIAL   PLAN:  PT FREQUENCY: 2x/week  PT DURATION: 8 weeks  PLANNED INTERVENTIONS: 97110-Therapeutic exercises, 97530- Therapeutic activity, [id number]- Neuromuscular re-education, 97535- Self Care, 16109- Manual therapy, [phone]- Gait training, [phone]- Aquatic Therapy, [phone]- Electrical stimulation (manual), [id number]- Vasopneumatic device, [id number]- Ultrasound, [id number]- Ionotophoresis 4mg /ml Dexamethasone, Patient/Family education, Balance training, Stair training, Taping, Dry Needling, Joint mobilization, Spinal mobilization, Scar mobilization, DME instructions, Cryotherapy, Moist heat, and Biofeedback  PLAN FOR NEXT SESSION: hip strengthening, gait training, DME use, standing balance, core strengthening   Otelia Sergeant, PT, DPT 02/12/258:21 PM

## 2024-01-29 ENCOUNTER — Other Ambulatory Visit: Payer: Self-pay

## 2024-01-30 ENCOUNTER — Other Ambulatory Visit: Payer: Self-pay

## 2024-02-02 ENCOUNTER — Encounter: Payer: Self-pay | Admitting: *Deleted

## 2024-02-03 ENCOUNTER — Ambulatory Visit: Payer: 59 | Admitting: Physical Therapy

## 2024-02-03 ENCOUNTER — Telehealth: Payer: Self-pay | Admitting: *Deleted

## 2024-02-03 ENCOUNTER — Encounter: Payer: Self-pay | Admitting: Physical Therapy

## 2024-02-03 DIAGNOSIS — R296 Repeated falls: Secondary | ICD-10-CM

## 2024-02-03 DIAGNOSIS — R269 Unspecified abnormalities of gait and mobility: Secondary | ICD-10-CM

## 2024-02-03 DIAGNOSIS — R29898 Other symptoms and signs involving the musculoskeletal system: Secondary | ICD-10-CM

## 2024-02-03 DIAGNOSIS — M6281 Muscle weakness (generalized): Secondary | ICD-10-CM

## 2024-02-03 NOTE — Telephone Encounter (Signed)
"  Logan Ward, [phone] (home) calling for the medical records phone number.  Not the one in office but the one you all send requests to." Provided (SW) H.I.M. phone number [phone].  Currentty no further questions or needs.

## 2024-02-03 NOTE — Therapy (Signed)
 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)    HIV infection (HCC)    Internal hemorrhoids    Obesity    Rectal ulcer    Past Surgical History:  Procedure Laterality Date   IR IMAGING GUIDED PORT INSERTION  11/18/2022   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   Neutropenia (HCC) 06/26/2023   Rash 06/26/2023   Intractable back pain suspect Granix-related 02/21/2023   Lactic acidosis 02/21/2023   Thrombocytopenia (HCC) 02/01/2023   Hyponatremia 01/31/2023   Hypokalemia 01/23/2023   Hematuria 01/11/2023   Neutropenic fever (HCC) 01/10/2023   Pancytopenia (HCC) 01/10/2023   Acute cystitis 01/10/2023   Anemia 01/01/2023   Healthcare maintenance 12/19/2022   Encounter for chemotherapy management 12/12/2022   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HCC) 12/10/2022   Decreased appetite 12/02/2022   Drug-induced constipation 11/21/2022   Nausea without vomiting 11/21/2022   Acute nonintractable headache 11/21/2022   Encounter for antineoplastic chemotherapy 11/18/2022   Burkitt lymphoma of lymph nodes of multiple regions [facility]) 11/14/2022   Counseling regarding advance care planning and goals of care 11/14/2022   Chest mass 11/10/2022   Exposure to chlamydia 10/24/2022   Submental lymphadenopathy 09/05/2022   Goals of care, counseling/discussion 07/26/2022    History of syphilis 07/26/2022   Neck swelling 07/26/2022   AIDS (acquired immune deficiency syndrome) (HCC) 07/25/2022    PCP: Johney Maine, MD  REFERRING PROVIDER: Johney Maine, MD  REFERRING DIAG: C83.78 (ICD-10-CM) - Burkitt lymphoma of lymph nodes of multiple regions [facility])  THERAPY DIAG:  Muscle weakness (generalized)  Abnormality of gait and mobility  Rationale for Evaluation and Treatment: Rehabilitation  ONSET DATE: 6-7 months  SUBJECTIVE:  SUBJECTIVE STATEMENT: The exercises did pretty good for a few days   Eval: Rt leg weakness and gives out sometimes, cramps throughout the night and sometimes while walking. Uses SPC and this helps a lot with prevention of falling when it gives out.  No pattern with walking or standing time with weakness.  4-5 falls. Does have numbness and tingling in both legs throughout bil thighs Did finish cancer treatments last year, does still port. Chemo completed. Wasn't very active during chemo with exhausted and feels so weak now.    PAIN:  Are you having pain? Yes NPRS scale: 7/10 Pain location: bil thighs  Pain type: aching and dull does report sharp with cramping at night Pain description: constant   Aggravating factors: cramping at night is highest pain; walking, standing Relieving factors: sitting to rest  PRECAUTIONS: Other: recent cancer   RED FLAGS: None   WEIGHT BEARING RESTRICTIONS: No  FALLS:  Has patient fallen in last 6 months? Yes. Number of falls 5 (most recent Sunday)  LIVING ENVIRONMENT: Lives with: lives with their family Lives in: House/apartment Stairs: No Has following equipment at home: Single point cane, FWW  OCCUPATION: off work right now, CNA  PLOF: Independent  PATIENT GOALS: to feel stronger and  get back to how I was before cancer  PERTINENT HISTORY:  HIV/AIDs; syphilis, rapidly growing right cervical nodal mass extending into the level 2 nodal station of the right supraclavicular nodal station and extension into the superficial right sternocleidomastoid muscle.  There is also hypermetabolic right axillary nodal disease.diagnosed in November/December 2023 Sexual abuse: NO  NEXT MD VISIT: 04/05/24 - oncologist  OBJECTIVE:  Note: Objective measures were completed at Evaluation unless otherwise noted.  DIAGNOSTIC FINDINGS:   Xray - hip 01/27/24 Mild degenerative changes to the hip joint  CT NECK WITH CONTRAST 12/28/23  IMPRESSION: 1. Resolution of the large indistinctly marginated mass lesion throughout the anterior lower neck, previously more extensive on the right than the left. Mild scarring of the right sternocleidomastoid muscle but no evidence of recurrent mass lesion. 2. Stable appearance of level 1 lymph nodes, the largest showing short axis dimension of 1 cm. Previously seen mass/adenopathy in the right level 2 region has resolved without residual. Other cervical chain lymph nodes on both sides of the neck are within normal limits in size.   PATIENT SURVEYS:  LEFS Lower Extremity Functional Score: 22 / 80 = 27.5 %  indicative of moderate functional limitation   COGNITION: Overall cognitive status: Within functional limits for tasks assessed     SENSATION: Numbness in both thighs worse with pain, can feel pressure but light touch   POSTURE: rounded shoulders and anterior pelvic tilt  PALPATION: TTP at Rt greater trochanter, tightness noted in Rt hip flexor, Rt lumbar paraspinal  LOWER EXTREMITY ROM:  WFL without pain for full flexion however at end range extension pt reports pain at proximal lateral hip on Rt  LOWER EXTREMITY MMT:  MMT Right Eval */5 Left eval  Hip flexion 3 4+  Hip extension 3+ 4+  Hip abduction 3+ 4  Hip adduction 3+ 4+  Hip  internal rotation    Hip external rotation    Knee flexion 3+ 4  Knee extension 3 4  Ankle dorsiflexion 3+ 4+  Ankle plantarflexion    Ankle inversion    Ankle eversion     (Blank rows = not tested)   FUNCTIONAL TESTS:  5 times sit to stand: 24.06s with use of hands and felt  7/10 on BORG with this  GAIT: Distance walked: 150' Assistive device utilized: Single point cane Level of assistance: Modified independence Comments: greatly decreased cadence, decreased step length and height at Rt leg                                                                                                                                TREATMENT DATE:  02/03/24 Seated LAQ 3# x 8 feels pull in back Seated march L 3#, R 0# x 12 Sit to stand x 10 from mat + foam pad - cues for controlled descent Supine march R 1x15 Supine hip ADD ball squeeze 10 sec hold x 6 then fatigues BKFO R too fatigued for adduction after ball squeeze so did unilateral clam with yellow band x 10 Standing hip ABD L x 10, R x 5 tingling into R foot so stopped Standing R hip ext x 10 Gait with SPC adjusted to correct height from ortho gym to cancer gym.      01/28/24 Examination completed, findings reviewed, pt educated on POC, HEP. Pt motivated to participate in PT and agreeable to attempt recommendations.     PATIENT EDUCATION:  Education details: [email or web address] Person educated: Patient Education method: Explanation, Demonstration, Tactile cues, Verbal cues, and Handouts Education comprehension: verbalized understanding, returned demonstration, verbal cues required, tactile cues required, and needs further education  HOME EXERCISE PROGRAM: Access Code: [email or web address] URL: [email or web address] Date: 02/03/2024 Prepared by: Raynelle Fanning  Exercises - Supine March  - 1 x daily - 7 x weekly - 2 sets - 10 reps - Bent Knee Fallouts  - 1 x daily - 7 x weekly - 2 sets - 10 reps - Supine Hip Adductor Squeeze with Small Ball  - 1  x daily - 7 x weekly - 2 sets - 10 reps - Sit to Stand  - 3 x daily - 7 x weekly - 1-2 sets - 5-10 reps - Hooklying Clamshell with Resistance  - 1 x daily - 3 x weekly - 2 sets - 10 reps  ASSESSMENT:  CLINICAL IMPRESSION: [place] reports ongoing B thigh pain. Seated and standing exercises were very challenging. He did well with hooklying exercises and sit to stand from foam with no UE support. We worked on gait with SPC in the left hand. He needed cueing at first, but then demonstrated good 3 point pattern. He fatigues easily and requires frequent short breaks. He is most comfortable standing in the parallel bars.   Eval: Patient is a 43 y.o. male  who was seen today for physical therapy evaluation and treatment for Rt leg weakness. Pt has complex history of Burkitt lymphoma with chemo treatments now completed. Pt reports he was exhausted during his treatments and feels like he is very weak and fatigued now. Does have numbness in bil thighs only posterior and anteriorly and feels like his Rt leg "gives out" to the point of him having 5 falls in the  last several months. Pt does demonstrate gait deficits, very slow cadence and heavy trunk lean onto [facility] which pt uses on RT hand reporting he has more able to catch himself with the cane him this hand if his knee "gives". Pt found to have decreased strength bil LE but worse in Rt leg. Pt demonstrated high fall risk based on 5xSTS and after completing this was very fatigued and needed several minutes to recover stating 7/10 on BORG scale. LEFS score 22 / 80. 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   OBJECTIVE IMPAIRMENTS: Abnormal gait, decreased activity tolerance, decreased balance, decreased coordination, decreased endurance, decreased knowledge of use of DME, decreased mobility, difficulty walking, decreased strength, increased fascial restrictions, impaired perceived  functional ability, increased muscle spasms, impaired flexibility, impaired sensation, improper body mechanics, postural dysfunction, and pain.   ACTIVITY LIMITATIONS: carrying, lifting, bending, sitting, standing, squatting, stairs, transfers, bed mobility, and locomotion level  PARTICIPATION LIMITATIONS: cleaning, laundry, interpersonal relationship, driving, shopping, community activity, occupation, and yard work  PERSONAL FACTORS: Fitness, Time since onset of injury/illness/exacerbation, and 1 comorbidity: medical history   are also affecting patient's functional outcome.   REHAB POTENTIAL: Good  CLINICAL DECISION MAKING: Evolving/moderate complexity  EVALUATION COMPLEXITY: Moderate   GOALS: Goals reviewed with patient? Yes  SHORT TERM GOALS: Target date: 02/25/24 Pt to be I with HEP.  Baseline: Goal status: INITIAL  2.  Pt to report improved LEFS score of 30 to improve toward lowering functional impairment limitations.  Baseline:  Goal status: INITIAL  3.  Pt to demonstrate ability to complete body weight squats without hands for 10 reps to improve LE strength for tolerance for walking.  Baseline:  Goal status: INITIAL  4.  Pt to demonstrate improved gait mechanics with even step length and height to be able to decreased fall risk with community ambulation.   Baseline:  Goal status: INITIAL   LONG TERM GOALS: Target date: 03/24/24  Pt to be I with advanced HEP.  Baseline:  Goal status: INITIAL  2.  Pt to demonstrate 10# squats without compensatory strategies for improved hip strength for tolerance to return to work Baseline:  Goal status: INITIAL  3.  Pt to report ability to walk with or without AD for at least 30 mins with no increase in pain for tolerance to return to work.  Baseline:  Goal status: INITIAL  4.  Pt to demonstrate 5xSTS in 7s to achieve age related norm for decreased fall risk Baseline:  Goal status: INITIAL  5.  Pt to demonstrate at least 4+/5  bil hip strength for improved pelvic stability and functional squats without leakage.  Baseline:  Goal status: INITIAL   PLAN:  PT FREQUENCY: 2x/week  PT DURATION: 8 weeks  PLANNED INTERVENTIONS: 97110-Therapeutic exercises, 97530- Therapeutic activity, [id number]- Neuromuscular re-education, 97535- Self Care, 30865- Manual therapy, [phone]- Gait training, [phone]- Aquatic Therapy, [phone]- Electrical stimulation (manual), [id number]- Vasopneumatic device, [id number]- Ultrasound, [id number]- Ionotophoresis 4mg /ml Dexamethasone, Patient/Family education, Balance training, Stair training, Taping, Dry Needling, Joint mobilization, Spinal mobilization, Scar mobilization, DME instructions, Cryotherapy, Moist heat, and Biofeedback  PLAN FOR NEXT SESSION: continue LE strengthening, gait training, DME use, standing balance, core strengthening   Solon Palm, PT  02/18/255:17 PM

## 2024-02-04 ENCOUNTER — Other Ambulatory Visit (HOSPITAL_COMMUNITY): Payer: Self-pay

## 2024-02-05 ENCOUNTER — Other Ambulatory Visit: Payer: Self-pay

## 2024-02-05 ENCOUNTER — Ambulatory Visit: Payer: 59

## 2024-02-09 ENCOUNTER — Other Ambulatory Visit: Payer: Self-pay

## 2024-02-11 ENCOUNTER — Other Ambulatory Visit: Payer: Self-pay | Admitting: Pharmacy Technician

## 2024-02-11 ENCOUNTER — Ambulatory Visit: Payer: 59 | Admitting: Physical Therapy

## 2024-02-11 ENCOUNTER — Other Ambulatory Visit: Payer: Self-pay

## 2024-02-11 ENCOUNTER — Encounter: Payer: Self-pay | Admitting: Physical Therapy

## 2024-02-11 DIAGNOSIS — M6281 Muscle weakness (generalized): Secondary | ICD-10-CM

## 2024-02-11 DIAGNOSIS — R29898 Other symptoms and signs involving the musculoskeletal system: Secondary | ICD-10-CM

## 2024-02-11 DIAGNOSIS — R269 Unspecified abnormalities of gait and mobility: Secondary | ICD-10-CM

## 2024-02-11 DIAGNOSIS — R296 Repeated falls: Secondary | ICD-10-CM

## 2024-02-11 NOTE — Progress Notes (Signed)
 Specialty Pharmacy Refill Coordination Note  Logan Ward is a 43 y.o. male contacted today regarding refills of specialty medication(s) Bictegravir-Emtricitab-Tenofov [name]   Patient requested Delivery   Delivery date: 02/13/24   Verified address: [address] [place]   Medication will be filled on 02/12/24.

## 2024-02-11 NOTE — Therapy (Signed)
 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)    HIV infection (HCC)    Internal hemorrhoids    Obesity    Rectal ulcer    Past Surgical History:  Procedure Laterality Date   IR IMAGING GUIDED PORT INSERTION  11/18/2022   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   Neutropenia (HCC) 06/26/2023   Rash 06/26/2023   Intractable back pain suspect Granix-related 02/21/2023   Lactic acidosis 02/21/2023   Thrombocytopenia (HCC) 02/01/2023   Hyponatremia 01/31/2023   Hypokalemia 01/23/2023   Hematuria 01/11/2023   Neutropenic fever (HCC) 01/10/2023   Pancytopenia (HCC) 01/10/2023   Acute cystitis 01/10/2023   Anemia 01/01/2023   Healthcare maintenance 12/19/2022   Encounter for chemotherapy management 12/12/2022   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HCC) 12/10/2022   Decreased appetite 12/02/2022   Drug-induced constipation 11/21/2022   Nausea without vomiting 11/21/2022   Acute nonintractable headache 11/21/2022   Encounter for antineoplastic chemotherapy 11/18/2022   Burkitt lymphoma of lymph nodes of multiple regions [facility]) 11/14/2022   Counseling regarding advance care planning and goals of care 11/14/2022   Chest mass 11/10/2022   Exposure to chlamydia 10/24/2022   Submental lymphadenopathy 09/05/2022   Goals of care, counseling/discussion 07/26/2022   History of syphilis 07/26/2022   Neck swelling 07/26/2022   AIDS (acquired immune deficiency syndrome) (HCC) 07/25/2022    PCP:  Johney Maine, MD  REFERRING PROVIDER: Johney Maine, MD  REFERRING DIAG: C83.78 (ICD-10-CM) - Burkitt lymphoma of lymph nodes of multiple regions [facility])  THERAPY DIAG:  Muscle weakness (generalized)  Abnormality of gait and mobility  Rationale for Evaluation and Treatment: Rehabilitation  ONSET DATE: 6-7 months  SUBJECTIVE:                                                                                                                                                                                           SUBJECTIVE STATEMENT: Patient reports his Rt leg is painful today. He felt good after  last treatment session.  Eval: Rt leg weakness and gives out sometimes, cramps throughout the night and sometimes while walking. Uses SPC and this helps a lot with prevention of falling when it gives out.  No pattern with walking or standing time with weakness.  4-5 falls. Does have numbness and tingling in both legs throughout bil thighs Did finish cancer treatments last year, does still port. Chemo completed. Wasn't very active during chemo with exhausted and feels so weak now.    PAIN:  Are you having pain? Yes NPRS scale: 6/10 Pain location: bil thighs  Pain type: aching and dull does report sharp with cramping at night Pain description: constant   Aggravating factors: cramping at night is highest pain; walking, standing Relieving factors: sitting to rest  PRECAUTIONS: Other: recent cancer   RED FLAGS: None   WEIGHT BEARING RESTRICTIONS: No  FALLS:  Has patient fallen in last 6 months? Yes. Number of falls 5 (most recent Sunday)  LIVING ENVIRONMENT: Lives with: lives with their family Lives in: House/apartment Stairs: No Has following equipment at home: Single point cane, FWW  OCCUPATION: off work right now, CNA  PLOF: Independent  PATIENT GOALS: to feel stronger and get back to how I was before cancer  PERTINENT HISTORY:  HIV/AIDs; syphilis, rapidly  growing right cervical nodal mass extending into the level 2 nodal station of the right supraclavicular nodal station and extension into the superficial right sternocleidomastoid muscle.  There is also hypermetabolic right axillary nodal disease.diagnosed in November/December 2023 Sexual abuse: NO  NEXT MD VISIT: 04/05/24 - oncologist  OBJECTIVE:  Note: Objective measures were completed at Evaluation unless otherwise noted.  DIAGNOSTIC FINDINGS:   Xray - hip 01/27/24 Mild degenerative changes to the hip joint  CT NECK WITH CONTRAST 12/28/23  IMPRESSION: 1. Resolution of the large indistinctly marginated mass lesion throughout the anterior lower neck, previously more extensive on the right than the left. Mild scarring of the right sternocleidomastoid muscle but no evidence of recurrent mass lesion. 2. Stable appearance of level 1 lymph nodes, the largest showing short axis dimension of 1 cm. Previously seen mass/adenopathy in the right level 2 region has resolved without residual. Other cervical chain lymph nodes on both sides of the neck are within normal limits in size.   PATIENT SURVEYS:  LEFS Lower Extremity Functional Score: 22 / 80 = 27.5 %  indicative of moderate functional limitation   COGNITION: Overall cognitive status: Within functional limits for tasks assessed     SENSATION: Numbness in both thighs worse with pain, can feel pressure but light touch   POSTURE: rounded shoulders and anterior pelvic tilt  PALPATION: TTP at Rt greater trochanter, tightness noted in Rt hip flexor, Rt lumbar paraspinal  LOWER EXTREMITY ROM:  WFL without pain for full flexion however at end range extension pt reports pain at proximal lateral hip on Rt  LOWER EXTREMITY MMT:  MMT Right Eval */5 Left eval  Hip flexion 3 4+  Hip extension 3+ 4+  Hip abduction 3+ 4  Hip adduction 3+ 4+  Hip internal rotation    Hip external rotation    Knee flexion 3+ 4  Knee extension 3 4   Ankle dorsiflexion 3+ 4+  Ankle plantarflexion    Ankle inversion    Ankle eversion     (Blank rows = not tested)   FUNCTIONAL TESTS:  5 times sit to stand: 24.06s with use of hands and felt 7/10 on BORG with this  GAIT: Distance walked:  150' Assistive device utilized: Single point cane Level of assistance: Modified independence Comments: greatly decreased cadence, decreased step length and height at Rt leg                                                                                                                                TREATMENT DATE:  02/11/24 NuStep Level 2 6 mins- PT present to discuss status Seated LAQ 3# x 8 feels  Supine SLR x 10 bilateral  Supine SAQ 3# AW x 10 bilateral  Sit to stand x 12 from mat + foam pad  Side stepping in // bars with green TB x 3 laps Discussion of how he ambulates at home & overall endurance   02/03/24 Seated LAQ 3# x 8 feels pull in back Seated march L 3#, R 0# x 12 Sit to stand x 10 from mat + foam pad - cues for controlled descent Supine march R 1x15 Supine hip ADD ball squeeze 10 sec hold x 6 then fatigues BKFO R too fatigued for adduction after ball squeeze so did unilateral clam with yellow band x 10 Standing hip ABD L x 10, R x 5 tingling into R foot so stopped Standing R hip ext x 10 Gait with SPC adjusted to correct height from ortho gym to cancer gym.      01/28/24 Examination completed, findings reviewed, pt educated on POC, HEP. Pt motivated to participate in PT and agreeable to attempt recommendations.     PATIENT EDUCATION:  Education details: [email or web address] Person educated: Patient Education method: Explanation, Demonstration, Tactile cues, Verbal cues, and Handouts Education comprehension: verbalized understanding, returned demonstration, verbal cues required, tactile cues required, and needs further education  HOME EXERCISE PROGRAM: Access Code: [email or web address] URL: [email or web address] Date:  02/03/2024 Prepared by: Raynelle Fanning  Exercises - Supine March  - 1 x daily - 7 x weekly - 2 sets - 10 reps - Bent Knee Fallouts  - 1 x daily - 7 x weekly - 2 sets - 10 reps - Supine Hip Adductor Squeeze with Small Ball  - 1 x daily - 7 x weekly - 2 sets - 10 reps - Sit to Stand  - 3 x daily - 7 x weekly - 1-2 sets - 5-10 reps - Hooklying Clamshell with Resistance  - 1 x daily - 3 x weekly - 2 sets - 10 reps  ASSESSMENT:  CLINICAL IMPRESSION: Amaury continues to report increased Rt thigh pain. He was able to tolerate all exercises at an increased intensity today. He required verbal and tactile cues for correct form. Noted some fatigue with standing exercises and sit to stands. Patient should continue to progress with physical therapy. Patient will benefit from skilled PT to address the below impairments and improve overall function.    Eval: Patient is a 43 y.o. male  who was seen today for physical therapy evaluation and treatment for Rt leg weakness. Pt has complex history of Burkitt  lymphoma with chemo treatments now completed. Pt reports he was exhausted during his treatments and feels like he is very weak and fatigued now. Does have numbness in bil thighs only posterior and anteriorly and feels like his Rt leg "gives out" to the point of him having 5 falls in the last several months. Pt does demonstrate gait deficits, very slow cadence and heavy trunk lean onto [facility] which pt uses on RT hand reporting he has more able to catch himself with the cane him this hand if his knee "gives". Pt found to have decreased strength bil LE but worse in Rt leg. Pt demonstrated high fall risk based on 5xSTS and after completing this was very fatigued and needed several minutes to recover stating 7/10 on BORG scale. LEFS score 22 / 80. 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   OBJECTIVE IMPAIRMENTS: Abnormal gait, decreased  activity tolerance, decreased balance, decreased coordination, decreased endurance, decreased knowledge of use of DME, decreased mobility, difficulty walking, decreased strength, increased fascial restrictions, impaired perceived functional ability, increased muscle spasms, impaired flexibility, impaired sensation, improper body mechanics, postural dysfunction, and pain.   ACTIVITY LIMITATIONS: carrying, lifting, bending, sitting, standing, squatting, stairs, transfers, bed mobility, and locomotion level  PARTICIPATION LIMITATIONS: cleaning, laundry, interpersonal relationship, driving, shopping, community activity, occupation, and yard work  PERSONAL FACTORS: Fitness, Time since onset of injury/illness/exacerbation, and 1 comorbidity: medical history   are also affecting patient's functional outcome.   REHAB POTENTIAL: Good  CLINICAL DECISION MAKING: Evolving/moderate complexity  EVALUATION COMPLEXITY: Moderate   GOALS: Goals reviewed with patient? Yes  SHORT TERM GOALS: Target date: 02/25/24 Pt to be I with HEP.  Baseline: Goal status: INITIAL  2.  Pt to report improved LEFS score of 30 to improve toward lowering functional impairment limitations.  Baseline:  Goal status: INITIAL  3.  Pt to demonstrate ability to complete body weight squats without hands for 10 reps to improve LE strength for tolerance for walking.  Baseline:  Goal status: INITIAL  4.  Pt to demonstrate improved gait mechanics with even step length and height to be able to decreased fall risk with community ambulation.   Baseline:  Goal status: INITIAL   LONG TERM GOALS: Target date: 03/24/24  Pt to be I with advanced HEP.  Baseline:  Goal status: INITIAL  2.  Pt to demonstrate 10# squats without compensatory strategies for improved hip strength for tolerance to return to work Baseline:  Goal status: INITIAL  3.  Pt to report ability to walk with or without AD for at least 30 mins with no increase in pain  for tolerance to return to work.  Baseline:  Goal status: INITIAL  4.  Pt to demonstrate 5xSTS in 7s to achieve age related norm for decreased fall risk Baseline:  Goal status: INITIAL  5.  Pt to demonstrate at least 4+/5 bil hip strength for improved pelvic stability and functional squats without leakage.  Baseline:  Goal status: INITIAL   PLAN:  PT FREQUENCY: 2x/week  PT DURATION: 8 weeks  PLANNED INTERVENTIONS: 97110-Therapeutic exercises, 97530- Therapeutic activity, [id number]- Neuromuscular re-education, 97535- Self Care, [id number]- Manual therapy, [phone]- Gait training, [phone]- Aquatic Therapy, [phone]- Electrical stimulation (manual), [id number]- Vasopneumatic device, [id number]- Ultrasound, [id number]- Ionotophoresis 4mg /ml Dexamethasone, Patient/Family education, Balance training, Stair training, Taping, Dry Needling, Joint mobilization, Spinal mobilization, Scar mobilization, DME instructions, Cryotherapy, Moist heat, and Biofeedback  PLAN FOR  NEXT SESSION: continue LE strengthening, standing tolerance ;standing balance, core strengthening   Claude Manges, PT 02/11/24 11:28 AM

## 2024-02-12 ENCOUNTER — Other Ambulatory Visit: Payer: Self-pay

## 2024-02-12 ENCOUNTER — Other Ambulatory Visit (HOSPITAL_COMMUNITY): Payer: Self-pay

## 2024-02-12 ENCOUNTER — Encounter: Payer: Self-pay | Admitting: Hematology

## 2024-02-13 ENCOUNTER — Encounter: Payer: Self-pay | Admitting: Hematology

## 2024-02-13 ENCOUNTER — Telehealth: Payer: Self-pay

## 2024-02-13 ENCOUNTER — Ambulatory Visit: Payer: 59

## 2024-02-13 NOTE — Telephone Encounter (Signed)
 Called to let patient know of missed appt.  Patient states he had already called and front office rescheduled him.  He had a fall and could not get here in time.

## 2024-02-17 ENCOUNTER — Encounter: Payer: Self-pay | Admitting: Physical Therapy

## 2024-02-17 ENCOUNTER — Ambulatory Visit: Payer: 59 | Attending: Hematology | Admitting: Physical Therapy

## 2024-02-17 DIAGNOSIS — R296 Repeated falls: Secondary | ICD-10-CM | POA: Insufficient documentation

## 2024-02-17 DIAGNOSIS — C8378 Burkitt lymphoma, lymph nodes of multiple sites: Secondary | ICD-10-CM | POA: Insufficient documentation

## 2024-02-17 DIAGNOSIS — R252 Cramp and spasm: Secondary | ICD-10-CM | POA: Insufficient documentation

## 2024-02-17 DIAGNOSIS — R262 Difficulty in walking, not elsewhere classified: Secondary | ICD-10-CM | POA: Diagnosis present

## 2024-02-17 DIAGNOSIS — M6281 Muscle weakness (generalized): Secondary | ICD-10-CM | POA: Diagnosis present

## 2024-02-17 DIAGNOSIS — R29898 Other symptoms and signs involving the musculoskeletal system: Secondary | ICD-10-CM | POA: Diagnosis present

## 2024-02-17 DIAGNOSIS — R269 Unspecified abnormalities of gait and mobility: Secondary | ICD-10-CM | POA: Insufficient documentation

## 2024-02-17 NOTE — Therapy (Signed)
 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)    HIV infection (HCC)    Internal hemorrhoids    Obesity    Rectal ulcer    Past Surgical History:  Procedure Laterality Date   IR IMAGING GUIDED PORT INSERTION  11/18/2022   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   Neutropenia (HCC) 06/26/2023   Rash 06/26/2023   Intractable back pain suspect Granix-related 02/21/2023   Lactic acidosis 02/21/2023   Thrombocytopenia (HCC) 02/01/2023   Hyponatremia 01/31/2023   Hypokalemia 01/23/2023   Hematuria 01/11/2023   Neutropenic fever (HCC) 01/10/2023   Pancytopenia (HCC) 01/10/2023   Acute cystitis 01/10/2023   Anemia 01/01/2023   Healthcare maintenance 12/19/2022   Encounter for chemotherapy management 12/12/2022   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HCC) 12/10/2022   Decreased appetite 12/02/2022   Drug-induced constipation 11/21/2022   Nausea without vomiting 11/21/2022   Acute nonintractable headache 11/21/2022   Encounter for antineoplastic chemotherapy 11/18/2022   Burkitt lymphoma of lymph nodes of multiple regions [facility]) 11/14/2022   Counseling regarding advance care planning and goals of care 11/14/2022   Chest mass 11/10/2022   Exposure to chlamydia 10/24/2022   Submental lymphadenopathy 09/05/2022   Goals of care, counseling/discussion 07/26/2022   History of  syphilis 07/26/2022   Neck swelling 07/26/2022   AIDS (acquired immune deficiency syndrome) (HCC) 07/25/2022    PCP: Johney Maine, MD  REFERRING PROVIDER: Johney Maine, MD  REFERRING DIAG: C83.78 (ICD-10-CM) - Burkitt lymphoma of lymph nodes of multiple regions [facility])  THERAPY DIAG:  Muscle weakness (generalized)  Abnormality of gait and mobility  Rationale for Evaluation and Treatment: Rehabilitation  ONSET DATE: 6-7 months  SUBJECTIVE:  SUBJECTIVE STATEMENT: Patient reports he is doing okay. He fell down walking from his his room to his closet. He did not hurt anything but leg has been very stiff.  Eval: Rt leg weakness and gives out sometimes, cramps throughout the night and sometimes while walking. Uses SPC and this helps a lot with prevention of falling when it gives out.  No pattern with walking or standing time with weakness.  4-5 falls. Does have numbness and tingling in both legs throughout bil thighs Did finish cancer treatments last year, does still port. Chemo completed. Wasn't very active during chemo with exhausted and feels so weak now.    PAIN: 02/17/2024 Are you having pain? Yes NPRS scale: 7/10 Pain location: bil thighs  Pain type: aching and dull does report sharp with cramping at night Pain description: constant   Aggravating factors: cramping at night is highest pain; walking, standing Relieving factors: sitting to rest  PRECAUTIONS: Other: recent cancer   RED FLAGS: None   WEIGHT BEARING RESTRICTIONS: No  FALLS:  Has patient fallen in last 6 months? Yes. Number of falls 5 (most recent Sunday)  LIVING ENVIRONMENT: Lives with: lives with their family Lives in: House/apartment Stairs: No Has following equipment at home: Single point cane,  FWW  OCCUPATION: off work right now, CNA  PLOF: Independent  PATIENT GOALS: to feel stronger and get back to how I was before cancer  PERTINENT HISTORY:  HIV/AIDs; syphilis, rapidly growing right cervical nodal mass extending into the level 2 nodal station of the right supraclavicular nodal station and extension into the superficial right sternocleidomastoid muscle.  There is also hypermetabolic right axillary nodal disease.diagnosed in November/December 2023 Sexual abuse: NO  NEXT MD VISIT: 04/05/24 - oncologist  OBJECTIVE:  Note: Objective measures were completed at Evaluation unless otherwise noted.  DIAGNOSTIC FINDINGS:   Xray - hip 01/27/24 Mild degenerative changes to the hip joint  CT NECK WITH CONTRAST 12/28/23  IMPRESSION: 1. Resolution of the large indistinctly marginated mass lesion throughout the anterior lower neck, previously more extensive on the right than the left. Mild scarring of the right sternocleidomastoid muscle but no evidence of recurrent mass lesion. 2. Stable appearance of level 1 lymph nodes, the largest showing short axis dimension of 1 cm. Previously seen mass/adenopathy in the right level 2 region has resolved without residual. Other cervical chain lymph nodes on both sides of the neck are within normal limits in size.   PATIENT SURVEYS:  LEFS Lower Extremity Functional Score: 22 / 80 = 27.5 %  indicative of moderate functional limitation   COGNITION: Overall cognitive status: Within functional limits for tasks assessed     SENSATION: Numbness in both thighs worse with pain, can feel pressure but light touch   POSTURE: rounded shoulders and anterior pelvic tilt  PALPATION: TTP at Rt greater trochanter, tightness noted in Rt hip flexor, Rt lumbar paraspinal  LOWER EXTREMITY ROM:  WFL without pain for full flexion however at end range extension pt reports pain at proximal lateral hip on Rt  LOWER EXTREMITY MMT:  MMT Right Eval */5  Left eval  Hip flexion 3 4+  Hip extension 3+ 4+  Hip abduction 3+ 4  Hip adduction 3+ 4+  Hip internal rotation    Hip external rotation    Knee flexion 3+ 4  Knee extension 3 4  Ankle dorsiflexion 3+ 4+  Ankle plantarflexion    Ankle inversion    Ankle eversion     (Blank rows = not  tested)   FUNCTIONAL TESTS:  5 times sit to stand: 24.06s with use of hands and felt 7/10 on BORG with this  GAIT: Distance walked: 150' Assistive device utilized: Single point cane Level of assistance: Modified independence Comments: greatly decreased cadence, decreased step length and height at Rt leg                                                                                                                                TREATMENT DATE:  02/17/24 NuStep Level 2 11 mins- PT present to discuss status Seated hamstring stretch 2 x 30 sec bilateral  3 way stability ball stretch x 8 each direction Seated knee flexion with slider x 20 bilateral  Seated LAQ 3# x 8  Sit to stand x 8 from mat + foam pad  Gait training: from sun room PT area to chair in long hallway    02/11/24 NuStep Level 2 6 mins- PT present to discuss status Seated LAQ 3# x 8  Supine SLR x 10 bilateral  Supine SAQ 3# AW x 10 bilateral  Sit to stand x 12 from mat + foam pad  Side stepping in // bars with green TB x 3 laps Discussion of how he ambulates at home & overall endurance   02/03/24 Seated LAQ 3# x 8 feels pull in back Seated march L 3#, R 0# x 12 Sit to stand x 10 from mat + foam pad - cues for controlled descent Supine march R 1x15 Supine hip ADD ball squeeze 10 sec hold x 6 then fatigues BKFO R too fatigued for adduction after ball squeeze so did unilateral clam with yellow band x 10 Standing hip ABD L x 10, R x 5 tingling into R foot so stopped Standing R hip ext x 10 Gait with SPC adjusted to correct height from ortho gym to cancer gym.      PATIENT EDUCATION:  Education details: [email or web address] Person  educated: Patient Education method: Explanation, Demonstration, Tactile cues, Verbal cues, and Handouts Education comprehension: verbalized understanding, returned demonstration, verbal cues required, tactile cues required, and needs further education  HOME EXERCISE PROGRAM: Access Code: [email or web address] URL: [email or web address] Date: 02/03/2024 Prepared by: Raynelle Fanning  Exercises - Supine March  - 1 x daily - 7 x weekly - 2 sets - 10 reps - Bent Knee Fallouts  - 1 x daily - 7 x weekly - 2 sets - 10 reps - Supine Hip Adductor Squeeze with Small Ball  - 1 x daily - 7 x weekly - 2 sets - 10 reps - Sit to Stand  - 3 x daily - 7 x weekly - 1-2 sets - 5-10 reps - Hooklying Clamshell with Resistance  - 1 x daily - 3 x weekly - 2 sets - 10 reps  ASSESSMENT:  CLINICAL IMPRESSION: Willie presents to physical therapy after a fall last week while walking to his closet. He verbalized that his right leg felt  week and gave away. He has not been moving around much since his fall. Noted guarded ambulation and decreased step length. Correct on joint ROM and knee flexibility during treatment session. Patient reports verbal ad visual cues for correct exercise performance. Patient will benefit from skilled PT to address the below impairments and improve overall function.     Eval: Patient is a 43 y.o. male  who was seen today for physical therapy evaluation and treatment for Rt leg weakness. Pt has complex history of Burkitt lymphoma with chemo treatments now completed. Pt reports he was exhausted during his treatments and feels like he is very weak and fatigued now. Does have numbness in bil thighs only posterior and anteriorly and feels like his Rt leg "gives out" to the point of him having 5 falls in the last several months. Pt does demonstrate gait deficits, very slow cadence and heavy trunk lean onto [facility] which pt uses on RT hand reporting he has more able to catch himself with the cane him this hand if  his knee "gives". Pt found to have decreased strength bil LE but worse in Rt leg. Pt demonstrated high fall risk based on 5xSTS and after completing this was very fatigued and needed several minutes to recover stating 7/10 on BORG scale. LEFS score 22 / 80. 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   OBJECTIVE IMPAIRMENTS: Abnormal gait, decreased activity tolerance, decreased balance, decreased coordination, decreased endurance, decreased knowledge of use of DME, decreased mobility, difficulty walking, decreased strength, increased fascial restrictions, impaired perceived functional ability, increased muscle spasms, impaired flexibility, impaired sensation, improper body mechanics, postural dysfunction, and pain.   ACTIVITY LIMITATIONS: carrying, lifting, bending, sitting, standing, squatting, stairs, transfers, bed mobility, and locomotion level  PARTICIPATION LIMITATIONS: cleaning, laundry, interpersonal relationship, driving, shopping, community activity, occupation, and yard work  PERSONAL FACTORS: Fitness, Time since onset of injury/illness/exacerbation, and 1 comorbidity: medical history   are also affecting patient's functional outcome.   REHAB POTENTIAL: Good  CLINICAL DECISION MAKING: Evolving/moderate complexity  EVALUATION COMPLEXITY: Moderate   GOALS: Goals reviewed with patient? Yes  SHORT TERM GOALS: Target date: 02/25/24 Pt to be I with HEP.  Baseline: Goal status: INITIAL  2.  Pt to report improved LEFS score of 30 to improve toward lowering functional impairment limitations.  Baseline:  Goal status: INITIAL  3.  Pt to demonstrate ability to complete body weight squats without hands for 10 reps to improve LE strength for tolerance for walking.  Baseline:  Goal status: INITIAL  4.  Pt to demonstrate improved gait mechanics with even step length and height to be able to decreased  fall risk with community ambulation.   Baseline:  Goal status: INITIAL   LONG TERM GOALS: Target date: 03/24/24  Pt to be I with advanced HEP.  Baseline:  Goal status: INITIAL  2.  Pt to demonstrate 10# squats without compensatory strategies for improved hip strength for tolerance to return to work Baseline:  Goal status: INITIAL  3.  Pt to report ability to walk with or without AD for at least 30 mins with no increase in pain for tolerance to return to work.  Baseline:  Goal status: INITIAL  4.  Pt to demonstrate 5xSTS in 7s to achieve age related norm for decreased fall risk Baseline:  Goal status: INITIAL  5.  Pt to demonstrate at least 4+/5 bil hip strength  for improved pelvic stability and functional squats without leakage.  Baseline:  Goal status: INITIAL   PLAN:  PT FREQUENCY: 2x/week  PT DURATION: 8 weeks  PLANNED INTERVENTIONS: 97110-Therapeutic exercises, 97530- Therapeutic activity, [id number]- Neuromuscular re-education, 97535- Self Care, [id number]- Manual therapy, [phone]- Gait training, [phone]- Aquatic Therapy, [phone]- Electrical stimulation (manual), [id number]- Vasopneumatic device, [id number]- Ultrasound, [id number]- Ionotophoresis 4mg /ml Dexamethasone, Patient/Family education, Balance training, Stair training, Taping, Dry Needling, Joint mobilization, Spinal mobilization, Scar mobilization, DME instructions, Cryotherapy, Moist heat, and Biofeedback  PLAN FOR NEXT SESSION: continue LE strengthening, standing tolerance ;standing balance, core strengthening   Claude Manges, PT 02/17/24 12:02 PM

## 2024-02-19 ENCOUNTER — Ambulatory Visit: Payer: 59

## 2024-02-19 DIAGNOSIS — R269 Unspecified abnormalities of gait and mobility: Secondary | ICD-10-CM

## 2024-02-19 DIAGNOSIS — R29898 Other symptoms and signs involving the musculoskeletal system: Secondary | ICD-10-CM

## 2024-02-19 DIAGNOSIS — M6281 Muscle weakness (generalized): Secondary | ICD-10-CM | POA: Diagnosis not present

## 2024-02-19 DIAGNOSIS — R296 Repeated falls: Secondary | ICD-10-CM

## 2024-02-19 NOTE — Therapy (Signed)
 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)    HIV infection (HCC)    Internal hemorrhoids    Obesity    Rectal ulcer    Past Surgical History:  Procedure Laterality Date   IR IMAGING GUIDED PORT INSERTION  11/18/2022   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   Neutropenia (HCC) 06/26/2023   Rash 06/26/2023   Intractable back pain suspect Granix-related 02/21/2023   Lactic acidosis 02/21/2023   Thrombocytopenia (HCC) 02/01/2023   Hyponatremia 01/31/2023   Hypokalemia 01/23/2023   Hematuria 01/11/2023   Neutropenic fever (HCC) 01/10/2023   Pancytopenia (HCC) 01/10/2023   Acute cystitis 01/10/2023   Anemia 01/01/2023   Healthcare maintenance 12/19/2022   Encounter for chemotherapy management 12/12/2022   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HCC) 12/10/2022   Decreased appetite 12/02/2022   Drug-induced constipation 11/21/2022   Nausea without vomiting 11/21/2022   Acute nonintractable headache 11/21/2022   Encounter for antineoplastic chemotherapy 11/18/2022   Burkitt lymphoma of lymph nodes of multiple regions [facility]) 11/14/2022   Counseling regarding advance care planning and goals of care 11/14/2022   Chest mass 11/10/2022   Exposure to chlamydia 10/24/2022   Submental lymphadenopathy 09/05/2022   Goals of care, counseling/discussion 07/26/2022   History of  syphilis 07/26/2022   Neck swelling 07/26/2022   AIDS (acquired immune deficiency syndrome) (HCC) 07/25/2022    PCP: Johney Maine, MD  REFERRING PROVIDER: Johney Maine, MD  REFERRING DIAG: C83.78 (ICD-10-CM) - Burkitt lymphoma of lymph nodes of multiple regions [name])  THERAPY DIAG:  Muscle weakness (generalized)  Abnormality of gait and mobility  Rationale for Evaluation and Treatment: Rehabilitation  ONSET DATE: 6-7 months  SUBJECTIVE:  SUBJECTIVE STATEMENT: Pt states that he has been feeling better since his fall last week, but still feels like he is dealing with a lot of stiffness in his Rt LE.   Eval: Rt leg weakness and gives out sometimes, cramps throughout the night and sometimes while walking. Uses SPC and this helps a lot with prevention of falling when it gives out.  No pattern with walking or standing time with weakness.  4-5 falls. Does have numbness and tingling in both legs throughout bil thighs Did finish cancer treatments last year, does still port. Chemo completed. Wasn't very active during chemo with exhausted and feels so weak now.    PAIN: 02/19/2024 Are you having pain? Yes NPRS scale: 7/10 Pain location: bil thighs  Pain type: aching and dull does report sharp with cramping at night Pain description: constant   Aggravating factors: cramping at night is highest pain; walking, standing Relieving factors: sitting to rest  PRECAUTIONS: Other: recent cancer   RED FLAGS: None   WEIGHT BEARING RESTRICTIONS: No  FALLS:  Has patient fallen in last 6 months? Yes. Number of falls 5 (most recent Sunday)  LIVING ENVIRONMENT: Lives with: lives with their family Lives in: House/apartment Stairs: No Has following equipment at home: Single point cane,  FWW  OCCUPATION: off work right now, CNA  PLOF: Independent  PATIENT GOALS: to feel stronger and get back to how I was before cancer  PERTINENT HISTORY:  HIV/AIDs; syphilis, rapidly growing right cervical nodal mass extending into the level 2 nodal station of the right supraclavicular nodal station and extension into the superficial right sternocleidomastoid muscle.  There is also hypermetabolic right axillary nodal disease.diagnosed in November/December 2023 Sexual abuse: NO  NEXT MD VISIT: 04/05/24 - oncologist  OBJECTIVE:  Note: Objective measures were completed at Evaluation unless otherwise noted.  DIAGNOSTIC FINDINGS:   Xray - hip 01/27/24 Mild degenerative changes to the hip joint  CT NECK WITH CONTRAST 12/28/23  IMPRESSION: 1. Resolution of the large indistinctly marginated mass lesion throughout the anterior lower neck, previously more extensive on the right than the left. Mild scarring of the right sternocleidomastoid muscle but no evidence of recurrent mass lesion. 2. Stable appearance of level 1 lymph nodes, the largest showing short axis dimension of 1 cm. Previously seen mass/adenopathy in the right level 2 region has resolved without residual. Other cervical chain lymph nodes on both sides of the neck are within normal limits in size.   PATIENT SURVEYS:  LEFS Lower Extremity Functional Score: 22 / 80 = 27.5 %  indicative of moderate functional limitation   COGNITION: Overall cognitive status: Within functional limits for tasks assessed     SENSATION: Numbness in both thighs worse with pain, can feel pressure but light touch   POSTURE: rounded shoulders and anterior pelvic tilt  PALPATION: TTP at Rt greater trochanter, tightness noted in Rt hip flexor, Rt lumbar paraspinal  LOWER EXTREMITY ROM:  WFL without pain for full flexion however at end range extension pt reports pain at proximal lateral hip on Rt  LOWER EXTREMITY MMT:  MMT Right Eval */5  Left eval  Hip flexion 3 4+  Hip extension 3+ 4+  Hip abduction 3+ 4  Hip adduction 3+ 4+  Hip internal rotation    Hip external rotation    Knee flexion 3+ 4  Knee extension 3 4  Ankle dorsiflexion 3+ 4+  Ankle plantarflexion    Ankle inversion    Ankle eversion     (Blank rows =  not tested)   FUNCTIONAL TESTS:  5 times sit to stand: 24.06s with use of hands and felt 7/10 on BORG with this  GAIT: Distance walked: 150' Assistive device utilized: Single point cane Level of assistance: Modified independence Comments: greatly decreased cadence, decreased step length and height at Rt leg                                                                                                                                TREATMENT DATE:  02/19/24 Exercises: Seated LAQ 3# x 8  Seated knee flexion with slider x 20 bilateral  3 way stability ball stretch x 8 each direction Seated hamstring stretch 2 x 30 sec bilateral  Therapeutic activities: NuStep Level 2 6 mins- PT present to discuss status Sit to stand x 8 from mat + foam pad  Standing resisted march in parallel bars 5x, red band for resistance   02/17/24 NuStep Level 2 11 mins- PT present to discuss status Seated hamstring stretch 2 x 30 sec bilateral  3 way stability ball stretch x 8 each direction Seated knee flexion with slider x 20 bilateral  Seated LAQ 3# x 8  Sit to stand x 8 from mat + foam pad  Gait training: from sun room PT area to chair in long hallway    02/11/24 NuStep Level 2 6 mins- PT present to discuss status Seated LAQ 3# x 8  Supine SLR x 10 bilateral  Supine SAQ 3# AW x 10 bilateral  Sit to stand x 12 from mat + foam pad  Side stepping in // bars with green TB x 3 laps Discussion of how he ambulates at home & overall endurance    PATIENT EDUCATION:  Education details: [email or web address] Person educated: Patient Education method: Explanation, Demonstration, Tactile cues, Verbal cues, and Handouts Education  comprehension: verbalized understanding, returned demonstration, verbal cues required, tactile cues required, and needs further education  HOME EXERCISE PROGRAM: Access Code: [email or web address] URL: [email or web address] Date: 02/03/2024 Prepared by: Raynelle Fanning  Exercises - Supine March  - 1 x daily - 7 x weekly - 2 sets - 10 reps - Bent Knee Fallouts  - 1 x daily - 7 x weekly - 2 sets - 10 reps - Supine Hip Adductor Squeeze with Small Ball  - 1 x daily - 7 x weekly - 2 sets - 10 reps - Sit to Stand  - 3 x daily - 7 x weekly - 1-2 sets - 5-10 reps - Hooklying Clamshell with Resistance  - 1 x daily - 3 x weekly - 2 sets - 10 reps  ASSESSMENT:  CLINICAL IMPRESSION:  Pt continues to be very tender in anterior Rt thigh; he states that this pain is what prevents him from achieving more appropriate hip/knee flexion while ambulating and started after his fall last week. He was encouraged to work on gentle massage to muscles of Rt thigh to help improve comfort and sensitivity. In  sit<>stand, he worked on bringing Rt feel back to be even with Lt and rising with a wider base of support; we also discussed bringing weight forward before trying to rise, pushing off of thighs if support needed to help keep weight anterior. He reported being uncomfortable with this due to fear of Rt LE not holding him. We worked on supported Rt hip.knee flexion in standing to help improve gait. He will continue to benefit form skilled PT intervention in order to improve functional strength and ambulation.     Eval: Patient is a 43 y.o. male  who was seen today for physical therapy evaluation and treatment for Rt leg weakness. Pt has complex history of Burkitt lymphoma with chemo treatments now completed. Pt reports he was exhausted during his treatments and feels like he is very weak and fatigued now. Does have numbness in bil thighs only posterior and anteriorly and feels like his Rt leg "gives out" to the point of him  having 5 falls in the last several months. Pt does demonstrate gait deficits, very slow cadence and heavy trunk lean onto [facility] which pt uses on RT hand reporting he has more able to catch himself with the cane him this hand if his knee "gives". Pt found to have decreased strength bil LE but worse in Rt leg. Pt demonstrated high fall risk based on 5xSTS and after completing this was very fatigued and needed several minutes to recover stating 7/10 on BORG scale. LEFS score 22 / 80. 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   OBJECTIVE IMPAIRMENTS: Abnormal gait, decreased activity tolerance, decreased balance, decreased coordination, decreased endurance, decreased knowledge of use of DME, decreased mobility, difficulty walking, decreased strength, increased fascial restrictions, impaired perceived functional ability, increased muscle spasms, impaired flexibility, impaired sensation, improper body mechanics, postural dysfunction, and pain.   ACTIVITY LIMITATIONS: carrying, lifting, bending, sitting, standing, squatting, stairs, transfers, bed mobility, and locomotion level  PARTICIPATION LIMITATIONS: cleaning, laundry, interpersonal relationship, driving, shopping, community activity, occupation, and yard work  PERSONAL FACTORS: Fitness, Time since onset of injury/illness/exacerbation, and 1 comorbidity: medical history   are also affecting patient's functional outcome.   REHAB POTENTIAL: Good  CLINICAL DECISION MAKING: Evolving/moderate complexity  EVALUATION COMPLEXITY: Moderate   GOALS: Goals reviewed with patient? Yes  SHORT TERM GOALS: Target date: 02/25/24 Pt to be I with HEP.  Baseline: Goal status: INITIAL  2.  Pt to report improved LEFS score of 30 to improve toward lowering functional impairment limitations.  Baseline:  Goal status: INITIAL  3.  Pt to demonstrate ability to complete body weight  squats without hands for 10 reps to improve LE strength for tolerance for walking.  Baseline:  Goal status: INITIAL  4.  Pt to demonstrate improved gait mechanics with even step length and height to be able to decreased fall risk with community ambulation.   Baseline:  Goal status: INITIAL   LONG TERM GOALS: Target date: 03/24/24  Pt to be I with advanced HEP.  Baseline:  Goal status: INITIAL  2.  Pt to demonstrate 10# squats without compensatory strategies for improved hip strength for tolerance to return to work Baseline:  Goal status: INITIAL  3.  Pt to report ability to walk with or without AD for at least 30 mins with no increase in pain for tolerance to return to work.  Baseline:  Goal status: INITIAL  4.  Pt to demonstrate 5xSTS in 7s to achieve age related norm for decreased fall risk Baseline:  Goal status: INITIAL  5.  Pt to demonstrate at least 4+/5 bil hip strength for improved pelvic stability and functional squats without leakage.  Baseline:  Goal status: INITIAL   PLAN:  PT FREQUENCY: 2x/week  PT DURATION: 8 weeks  PLANNED INTERVENTIONS: 97110-Therapeutic exercises, 97530- Therapeutic activity, [id number]- Neuromuscular re-education, 97535- Self Care, [id number]- Manual therapy, [phone]- Gait training, [phone]- Aquatic Therapy, [phone]- Electrical stimulation (manual), [id number]- Vasopneumatic device, [id number]- Ultrasound, [id number]- Ionotophoresis 4mg /ml Dexamethasone, Patient/Family education, Balance training, Stair training, Taping, Dry Needling, Joint mobilization, Spinal mobilization, Scar mobilization, DME instructions, Cryotherapy, Moist heat, and Biofeedback  PLAN FOR NEXT SESSION: continue LE strengthening, standing tolerance ;standing balance, core strengthening   Julio Alm, PT, DPT03/05/2510:01 AM

## 2024-02-24 ENCOUNTER — Ambulatory Visit: Payer: 59

## 2024-02-24 DIAGNOSIS — R296 Repeated falls: Secondary | ICD-10-CM

## 2024-02-24 DIAGNOSIS — R252 Cramp and spasm: Secondary | ICD-10-CM

## 2024-02-24 DIAGNOSIS — M6281 Muscle weakness (generalized): Secondary | ICD-10-CM | POA: Diagnosis not present

## 2024-02-24 DIAGNOSIS — R269 Unspecified abnormalities of gait and mobility: Secondary | ICD-10-CM

## 2024-02-24 DIAGNOSIS — R29898 Other symptoms and signs involving the musculoskeletal system: Secondary | ICD-10-CM

## 2024-02-24 DIAGNOSIS — R262 Difficulty in walking, not elsewhere classified: Secondary | ICD-10-CM

## 2024-02-24 NOTE — Therapy (Signed)
 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)    HIV infection (HCC)    Internal hemorrhoids    Obesity    Rectal ulcer    Past Surgical History:  Procedure Laterality Date   IR IMAGING GUIDED PORT INSERTION  11/18/2022   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   Neutropenia (HCC) 06/26/2023   Rash 06/26/2023   Intractable back pain suspect Granix-related 02/21/2023   Lactic acidosis 02/21/2023   Thrombocytopenia (HCC) 02/01/2023   Hyponatremia 01/31/2023   Hypokalemia 01/23/2023   Hematuria 01/11/2023   Neutropenic fever (HCC) 01/10/2023   Pancytopenia (HCC) 01/10/2023   Acute cystitis 01/10/2023   Anemia 01/01/2023   Healthcare maintenance 12/19/2022   Encounter for chemotherapy management 12/12/2022   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HCC) 12/10/2022   Decreased appetite 12/02/2022   Drug-induced constipation 11/21/2022   Nausea without vomiting 11/21/2022   Acute nonintractable headache 11/21/2022   Encounter for antineoplastic chemotherapy 11/18/2022   Burkitt lymphoma of lymph nodes of multiple regions [facility]) 11/14/2022   Counseling regarding advance care planning and goals of care 11/14/2022   Chest mass 11/10/2022   Exposure to chlamydia 10/24/2022   Submental lymphadenopathy 09/05/2022   Goals of care, counseling/discussion  07/26/2022   History of syphilis 07/26/2022   Neck swelling 07/26/2022   AIDS (acquired immune deficiency syndrome) (HCC) 07/25/2022    PCP: Johney Maine, MD  REFERRING PROVIDER: Johney Maine, MD  REFERRING DIAG: C83.78 (ICD-10-CM) - Burkitt lymphoma of lymph nodes of multiple regions [facility])  THERAPY DIAG:  Muscle weakness (generalized)  Abnormality of gait and mobility  Rationale for Evaluation and Treatment: Rehabilitation  ONSET DATE: 6-7 months  SUBJECTIVE:  SUBJECTIVE STATEMENT: Pt reports no falls since last visit.  Leg has been feeling ok. I still get the severe cramps at night but they aren't as frequent.  Instead of it waking me up 3-4 times per night, its only been 1-2 times per night.    Eval: Rt leg weakness and gives out sometimes, cramps throughout the night and sometimes while walking. Uses SPC and this helps a lot with prevention of falling when it gives out.  No pattern with walking or standing time with weakness.  4-5 falls. Does have numbness and tingling in both legs throughout bil thighs Did finish cancer treatments last year, does still port. Chemo completed. Wasn't very active during chemo with exhausted and feels so weak now.    PAIN: 02/24/2024 Are you having pain? Yes NPRS scale: 7/10 Pain location: bil thighs  Pain type: aching and dull does report sharp with cramping at night Pain description: constant   Aggravating factors: cramping at night is highest pain; walking, standing Relieving factors: sitting to rest  PRECAUTIONS: Other: recent cancer   RED FLAGS: None   WEIGHT BEARING RESTRICTIONS: No  FALLS:  Has patient fallen in last 6 months? Yes. Number of falls 5 (most recent Sunday)  LIVING ENVIRONMENT: Lives with: lives with their  family Lives in: House/apartment Stairs: No Has following equipment at home: Single point cane, FWW  OCCUPATION: off work right now, CNA  PLOF: Independent  PATIENT GOALS: to feel stronger and get back to how I was before cancer  PERTINENT HISTORY:  HIV/AIDs; syphilis, rapidly growing right cervical nodal mass extending into the level 2 nodal station of the right supraclavicular nodal station and extension into the superficial right sternocleidomastoid muscle.  There is also hypermetabolic right axillary nodal disease.diagnosed in November/December 2023 Sexual abuse: NO  NEXT MD VISIT: 04/05/24 - oncologist  OBJECTIVE:  Note: Objective measures were completed at Evaluation unless otherwise noted.  DIAGNOSTIC FINDINGS:   Xray - hip 01/27/24 Mild degenerative changes to the hip joint  CT NECK WITH CONTRAST 12/28/23  IMPRESSION: 1. Resolution of the large indistinctly marginated mass lesion throughout the anterior lower neck, previously more extensive on the right than the left. Mild scarring of the right sternocleidomastoid muscle but no evidence of recurrent mass lesion. 2. Stable appearance of level 1 lymph nodes, the largest showing short axis dimension of 1 cm. Previously seen mass/adenopathy in the right level 2 region has resolved without residual. Other cervical chain lymph nodes on both sides of the neck are within normal limits in size.   PATIENT SURVEYS:  LEFS Lower Extremity Functional Score: 22 / 80 = 27.5 %  indicative of moderate functional limitation   COGNITION: Overall cognitive status: Within functional limits for tasks assessed     SENSATION: Numbness in both thighs worse with pain, can feel pressure but light touch   POSTURE: rounded shoulders and anterior pelvic tilt  PALPATION: TTP at Rt greater trochanter, tightness noted in Rt hip flexor, Rt lumbar paraspinal  LOWER EXTREMITY ROM:  WFL without pain for full flexion however at end range  extension pt reports pain at proximal lateral hip on Rt  LOWER EXTREMITY MMT:  MMT Right Eval */5 Left eval  Hip flexion 3 4+  Hip extension 3+ 4+  Hip abduction 3+ 4  Hip adduction 3+ 4+  Hip internal rotation    Hip external rotation    Knee flexion 3+ 4  Knee extension 3 4  Ankle dorsiflexion 3+ 4+  Ankle plantarflexion  Ankle inversion    Ankle eversion     (Blank rows = not tested)   FUNCTIONAL TESTS:  5 times sit to stand: 24.06s with use of hands and felt 7/10 on BORG with this  GAIT: Distance walked: 150' Assistive device utilized: Single point cane Level of assistance: Modified independence Comments: greatly decreased cadence, decreased step length and height at Rt leg                                                                                                                                TREATMENT DATE:  02/24/24 (7 min late for appt) NuStep Level 3 x 6 mins- PT present to discuss status Seated hamstring stretch 2 x 30 sec bilateral  3 way stability ball stretch x 10 each direction Seated knee flexion with slider x 20 bilateral  Seated LAQ 3# x 10 on left and 4 lbs on right  Sit to stand x 10 from mat + foam pad (use of both hands) Gait training: from cancer PT area to chair in long hallway  02/19/24 Exercises: Seated LAQ 3# x 8  Seated knee flexion with slider x 20 bilateral  3 way stability ball stretch x 8 each direction Seated hamstring stretch 2 x 30 sec bilateral  Therapeutic activities: NuStep Level 2 6 mins- PT present to discuss status Sit to stand x 8 from mat + foam pad  Standing resisted march in parallel bars 5x, red band for resistance   02/17/24 NuStep Level 2 11 mins- PT present to discuss status Seated hamstring stretch 2 x 30 sec bilateral  3 way stability ball stretch x 8 each direction Seated knee flexion with slider x 20 bilateral  Seated LAQ 3# x 8  Sit to stand x 8 from mat + foam pad  Gait training: from sun room PT  area to chair in long hallway   PATIENT EDUCATION:  Education details: [email or web address] Person educated: Patient Education method: Explanation, Demonstration, Tactile cues, Verbal cues, and Handouts Education comprehension: verbalized understanding, returned demonstration, verbal cues required, tactile cues required, and needs further education  HOME EXERCISE PROGRAM: Access Code: [email or web address] URL: [email or web address] Date: 02/03/2024 Prepared by: Raynelle Fanning  Exercises - Supine March  - 1 x daily - 7 x weekly - 2 sets - 10 reps - Bent Knee Fallouts  - 1 x daily - 7 x weekly - 2 sets - 10 reps - Supine Hip Adductor Squeeze with Small Ball  - 1 x daily - 7 x weekly - 2 sets - 10 reps - Sit to Stand  - 3 x daily - 7 x weekly - 1-2 sets - 5-10 reps - Hooklying Clamshell with Resistance  - 1 x daily - 3 x weekly - 2 sets - 10 reps  ASSESSMENT:  CLINICAL IMPRESSION:  Pt continues to experience right leg pain and weakness but was able to do a few more reps on several tasks.  We attempted  4 lb ankle weight on right LE for LAQ.  He was not able to do full extension but completed 10 attempts.  He fatigues very easily and had some cramping in the right LE on hamstring curls.  He ambulates with SPC with very slow gait speed, poor foot clearance on right LE, often dragging right foot.  We discussed working on heel strike but patient expresses fear of falling.  He would benefit from continuing skilled PT to progress toward stated goals.   Eval: Patient is a 43 y.o. male  who was seen today for physical therapy evaluation and treatment for Rt leg weakness. Pt has complex history of Burkitt lymphoma with chemo treatments now completed. Pt reports he was exhausted during his treatments and feels like he is very weak and fatigued now. Does have numbness in bil thighs only posterior and anteriorly and feels like his Rt leg "gives out" to the point of him having 5 falls in the last several months. Pt does  demonstrate gait deficits, very slow cadence and heavy trunk lean onto [facility] which pt uses on RT hand reporting he has more able to catch himself with the cane him this hand if his knee "gives". Pt found to have decreased strength bil LE but worse in Rt leg. Pt demonstrated high fall risk based on 5xSTS and after completing this was very fatigued and needed several minutes to recover stating 7/10 on BORG scale. LEFS score 22 / 80. 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   OBJECTIVE IMPAIRMENTS: Abnormal gait, decreased activity tolerance, decreased balance, decreased coordination, decreased endurance, decreased knowledge of use of DME, decreased mobility, difficulty walking, decreased strength, increased fascial restrictions, impaired perceived functional ability, increased muscle spasms, impaired flexibility, impaired sensation, improper body mechanics, postural dysfunction, and pain.   ACTIVITY LIMITATIONS: carrying, lifting, bending, sitting, standing, squatting, stairs, transfers, bed mobility, and locomotion level  PARTICIPATION LIMITATIONS: cleaning, laundry, interpersonal relationship, driving, shopping, community activity, occupation, and yard work  PERSONAL FACTORS: Fitness, Time since onset of injury/illness/exacerbation, and 1 comorbidity: medical history   are also affecting patient's functional outcome.   REHAB POTENTIAL: Good  CLINICAL DECISION MAKING: Evolving/moderate complexity  EVALUATION COMPLEXITY: Moderate   GOALS: Goals reviewed with patient? Yes  SHORT TERM GOALS: Target date: 02/25/24 Pt to be I with HEP.  Baseline: Goal status: In progress  2.  Pt to report improved LEFS score of 30 to improve toward lowering functional impairment limitations.  Baseline:  Goal status: INITIAL  3.  Pt to demonstrate ability to complete body weight squats without hands for 10 reps to improve LE  strength for tolerance for walking.  Baseline:  Goal status: In Progress  4.  Pt to demonstrate improved gait mechanics with even step length and height to be able to decreased fall risk with community ambulation.   Baseline:  Goal status: In progress   LONG TERM GOALS: Target date: 03/24/24  Pt to be I with advanced HEP.  Baseline:  Goal status: INITIAL  2.  Pt to demonstrate 10# squats without compensatory strategies for improved hip strength for tolerance to return to work Baseline:  Goal status: INITIAL  3.  Pt to report ability to walk with or without AD for at least 30 mins with no increase in pain for tolerance to return to work.  Baseline:  Goal status: INITIAL  4.  Pt to demonstrate  5xSTS in 7s to achieve age related norm for decreased fall risk Baseline:  Goal status: INITIAL  5.  Pt to demonstrate at least 4+/5 bil hip strength for improved pelvic stability and functional squats without leakage.  Baseline:  Goal status: INITIAL   PLAN:  PT FREQUENCY: 2x/week  PT DURATION: 8 weeks  PLANNED INTERVENTIONS: 97110-Therapeutic exercises, 97530- Therapeutic activity, [id number]- Neuromuscular re-education, 97535- Self Care, [id number]- Manual therapy, [id number]- Gait training, [phone]- Aquatic Therapy, [phone]- Electrical stimulation (manual), [id number]- Vasopneumatic device, [id number]- Ultrasound, [id number]- Ionotophoresis 4mg /ml Dexamethasone, Patient/Family education, Balance training, Stair training, Taping, Dry Needling, Joint mobilization, Spinal mobilization, Scar mobilization, DME instructions, Cryotherapy, Moist heat, and Biofeedback  PLAN FOR NEXT SESSION: continue LE strengthening, standing tolerance ;standing balance, core strengthening    Adalyne Lovick B. Ermine Stebbins, PT 02/24/24 8:47 PM [facility] [address] [place], [place] [address] Phone # [phone] Fax [phone]

## 2024-02-26 ENCOUNTER — Ambulatory Visit: Payer: 59

## 2024-02-26 ENCOUNTER — Encounter: Payer: Self-pay | Admitting: *Deleted

## 2024-02-26 DIAGNOSIS — R296 Repeated falls: Secondary | ICD-10-CM

## 2024-02-26 DIAGNOSIS — M6281 Muscle weakness (generalized): Secondary | ICD-10-CM

## 2024-02-26 DIAGNOSIS — R252 Cramp and spasm: Secondary | ICD-10-CM

## 2024-02-26 DIAGNOSIS — R262 Difficulty in walking, not elsewhere classified: Secondary | ICD-10-CM

## 2024-02-26 DIAGNOSIS — R29898 Other symptoms and signs involving the musculoskeletal system: Secondary | ICD-10-CM

## 2024-02-26 NOTE — Therapy (Signed)
 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)    HIV infection (HCC)    Internal hemorrhoids    Obesity    Rectal ulcer    Past Surgical History:  Procedure Laterality Date   IR IMAGING GUIDED PORT INSERTION  11/18/2022   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   Neutropenia (HCC) 06/26/2023   Rash 06/26/2023   Intractable back pain suspect Granix-related 02/21/2023   Lactic acidosis 02/21/2023   Thrombocytopenia (HCC) 02/01/2023   Hyponatremia 01/31/2023   Hypokalemia 01/23/2023   Hematuria 01/11/2023   Neutropenic fever (HCC) 01/10/2023   Pancytopenia (HCC) 01/10/2023   Acute cystitis 01/10/2023   Anemia 01/01/2023   Healthcare maintenance 12/19/2022   Encounter for chemotherapy management 12/12/2022   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HCC) 12/10/2022   Decreased appetite 12/02/2022   Drug-induced constipation 11/21/2022   Nausea without vomiting 11/21/2022   Acute nonintractable headache 11/21/2022   Encounter for antineoplastic chemotherapy 11/18/2022   Burkitt lymphoma of lymph nodes of multiple regions [facility]) 11/14/2022   Counseling regarding advance care planning and goals of care 11/14/2022   Chest mass 11/10/2022   Exposure to chlamydia 10/24/2022   Submental lymphadenopathy 09/05/2022   Goals of care, counseling/discussion  07/26/2022   History of syphilis 07/26/2022   Neck swelling 07/26/2022   AIDS (acquired immune deficiency syndrome) (HCC) 07/25/2022    PCP: Johney Maine, MD  REFERRING PROVIDER: Johney Maine, MD  REFERRING DIAG: C83.78 (ICD-10-CM) - Burkitt lymphoma of lymph nodes of multiple regions [facility])  THERAPY DIAG:  Muscle weakness (generalized)  Abnormality of gait and mobility  Rationale for Evaluation and Treatment: Rehabilitation  ONSET DATE: 6-7 months  SUBJECTIVE:  SUBJECTIVE STATEMENT: Pt reports no falls since last visit.  I've stumbled a few times but I was next to the wall.  Pain 6/10.   Eval: Rt leg weakness and gives out sometimes, cramps throughout the night and sometimes while walking. Uses SPC and this helps a lot with prevention of falling when it gives out.  No pattern with walking or standing time with weakness.  4-5 falls. Does have numbness and tingling in both legs throughout bil thighs Did finish cancer treatments last year, does still port. Chemo completed. Wasn't very active during chemo with exhausted and feels so weak now.    PAIN: 02/26/2024 Are you having pain? Yes NPRS scale: 6/10 Pain location: bil thighs  Pain type: aching and dull does report sharp with cramping at night Pain description: constant   Aggravating factors: cramping at night is highest pain; walking, standing Relieving factors: sitting to rest  PRECAUTIONS: Other: recent cancer   RED FLAGS: None   WEIGHT BEARING RESTRICTIONS: No  FALLS:  Has patient fallen in last 6 months? Yes. Number of falls 5 (most recent Sunday)  LIVING ENVIRONMENT: Lives with: lives with their family Lives in: House/apartment Stairs: No Has following equipment at home: Single point cane, FWW  OCCUPATION:  off work right now, CNA  PLOF: Independent  PATIENT GOALS: to feel stronger and get back to how I was before cancer  PERTINENT HISTORY:  HIV/AIDs; syphilis, rapidly growing right cervical nodal mass extending into the level 2 nodal station of the right supraclavicular nodal station and extension into the superficial right sternocleidomastoid muscle.  There is also hypermetabolic right axillary nodal disease.diagnosed in November/December 2023 Sexual abuse: NO  NEXT MD VISIT: 04/05/24 - oncologist  OBJECTIVE:  Note: Objective measures were completed at Evaluation unless otherwise noted.  DIAGNOSTIC FINDINGS:   Xray - hip 01/27/24 Mild degenerative changes to the hip joint  CT NECK WITH CONTRAST 12/28/23  IMPRESSION: 1. Resolution of the large indistinctly marginated mass lesion throughout the anterior lower neck, previously more extensive on the right than the left. Mild scarring of the right sternocleidomastoid muscle but no evidence of recurrent mass lesion. 2. Stable appearance of level 1 lymph nodes, the largest showing short axis dimension of 1 cm. Previously seen mass/adenopathy in the right level 2 region has resolved without residual. Other cervical chain lymph nodes on both sides of the neck are within normal limits in size.   PATIENT SURVEYS:  LEFS Lower Extremity Functional Score: 22 / 80 = 27.5 %  indicative of moderate functional limitation   COGNITION: Overall cognitive status: Within functional limits for tasks assessed     SENSATION: Numbness in both thighs worse with pain, can feel pressure but light touch   POSTURE: rounded shoulders and anterior pelvic tilt  PALPATION: TTP at Rt greater trochanter, tightness noted in Rt hip flexor, Rt lumbar paraspinal  LOWER EXTREMITY ROM:  WFL without pain for full flexion however at end range extension pt reports pain at proximal lateral hip on Rt  LOWER EXTREMITY MMT:  MMT Right Eval */5 Left eval  Hip  flexion 3 4+  Hip extension 3+ 4+  Hip abduction 3+ 4  Hip adduction 3+ 4+  Hip internal rotation    Hip external rotation    Knee flexion 3+ 4  Knee extension 3 4  Ankle dorsiflexion 3+ 4+  Ankle plantarflexion    Ankle inversion    Ankle eversion     (Blank rows = not tested)   FUNCTIONAL  TESTS:  5 times sit to stand: 24.06s with use of hands and felt 7/10 on BORG with this  GAIT: Distance walked: 150' Assistive device utilized: Single point cane Level of assistance: Modified independence Comments: greatly decreased cadence, decreased step length and height at Rt leg                                                                                                                                TREATMENT DATE:  02/24/24 (8 min late for appt) NuStep Level 3 x 10 mins- PT present to discuss status Gait training: 128 feet with SPC (one standing rest break , occasionally uses wall for support) Seated hamstring stretch 2 x 30 sec bilateral  3 way stability ball stretch x 10 each direction Seated knee flexion with slider x 20 bilateral  Seated LAQ 3# x 10 on right and 8 lbs on left  Sit to stand x 10 from mat table (patient able to do 8 today)  02/24/24 (7 min late for appt) NuStep Level 3 x 6 mins- PT present to discuss status Seated hamstring stretch 2 x 30 sec bilateral  3 way stability ball stretch x 10 each direction Seated knee flexion with slider x 20 bilateral  Seated LAQ 3# x 10 on left and 4 lbs on right  Sit to stand x 10 from mat + foam pad (use of both hands) Gait training: from cancer PT area to chair in long hallway  02/19/24 Exercises: Seated LAQ 3# x 8  Seated knee flexion with slider x 20 bilateral  3 way stability ball stretch x 8 each direction Seated hamstring stretch 2 x 30 sec bilateral  Therapeutic activities: NuStep Level 2 6 mins- PT present to discuss status Sit to stand x 8 from mat + foam pad  Standing resisted march in parallel bars 5x, red  band for resistance   PATIENT EDUCATION:  Education details: [email or web address] Person educated: Patient Education method: Explanation, Demonstration, Tactile cues, Verbal cues, and Handouts Education comprehension: verbalized understanding, returned demonstration, verbal cues required, tactile cues required, and needs further education  HOME EXERCISE PROGRAM: Access Code: [email or web address] URL: [name] Date: 02/03/2024 Prepared by: Raynelle Fanning  Exercises - Supine March  - 1 x daily - 7 x weekly - 2 sets - 10 reps - Bent Knee Fallouts  - 1 x daily - 7 x weekly - 2 sets - 10 reps - Supine Hip Adductor Squeeze with Small Ball  - 1 x daily - 7 x weekly - 2 sets - 10 reps - Sit to Stand  - 3 x daily - 7 x weekly - 1-2 sets - 5-10 reps - Hooklying Clamshell with Resistance  - 1 x daily - 3 x weekly - 2 sets - 10 reps  ASSESSMENT:  CLINICAL IMPRESSION:  Tion is showing slow but steady progress.  He has not had any falls in the past 2 weeks and has doubled his walking  distance in 2 weeks.  He was able to ambulate 128 feet today with one standing rest break with improved left foot clearance.  He would benefit from continued skilled PT for LE strengthening and fall prevention.     Eval: Patient is a 43 y.o. male  who was seen today for physical therapy evaluation and treatment for Rt leg weakness. Pt has complex history of Burkitt lymphoma with chemo treatments now completed. Pt reports he was exhausted during his treatments and feels like he is very weak and fatigued now. Does have numbness in bil thighs only posterior and anteriorly and feels like his Rt leg "gives out" to the point of him having 5 falls in the last several months. Pt does demonstrate gait deficits, very slow cadence and heavy trunk lean onto [facility] which pt uses on RT hand reporting he has more able to catch himself with the cane him this hand if his knee "gives". Pt found to have decreased strength bil LE but worse in Rt  leg. Pt demonstrated high fall risk based on 5xSTS and after completing this was very fatigued and needed several minutes to recover stating 7/10 on BORG scale. LEFS score 22 / 80. 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   OBJECTIVE IMPAIRMENTS: Abnormal gait, decreased activity tolerance, decreased balance, decreased coordination, decreased endurance, decreased knowledge of use of DME, decreased mobility, difficulty walking, decreased strength, increased fascial restrictions, impaired perceived functional ability, increased muscle spasms, impaired flexibility, impaired sensation, improper body mechanics, postural dysfunction, and pain.   ACTIVITY LIMITATIONS: carrying, lifting, bending, sitting, standing, squatting, stairs, transfers, bed mobility, and locomotion level  PARTICIPATION LIMITATIONS: cleaning, laundry, interpersonal relationship, driving, shopping, community activity, occupation, and yard work  PERSONAL FACTORS: Fitness, Time since onset of injury/illness/exacerbation, and 1 comorbidity: medical history   are also affecting patient's functional outcome.   REHAB POTENTIAL: Good  CLINICAL DECISION MAKING: Evolving/moderate complexity  EVALUATION COMPLEXITY: Moderate   GOALS: Goals reviewed with patient? Yes  SHORT TERM GOALS: Target date: 02/25/24 Pt to be I with HEP.  Baseline: Goal status: In progress  2.  Pt to report improved LEFS score of 30 to improve toward lowering functional impairment limitations.  Baseline:  Goal status: INITIAL  3.  Pt to demonstrate ability to complete body weight squats without hands for 10 reps to improve LE strength for tolerance for walking.  Baseline:  Goal status: In Progress  4.  Pt to demonstrate improved gait mechanics with even step length and height to be able to decreased fall risk with community ambulation.   Baseline:  Goal status: In  progress   LONG TERM GOALS: Target date: 03/24/24  Pt to be I with advanced HEP.  Baseline:  Goal status: INITIAL  2.  Pt to demonstrate 10# squats without compensatory strategies for improved hip strength for tolerance to return to work Baseline:  Goal status: INITIAL  3.  Pt to report ability to walk with or without AD for at least 30 mins with no increase in pain for tolerance to return to work.  Baseline:  Goal status: INITIAL  4.  Pt to demonstrate 5xSTS in 7s to achieve age related norm for decreased fall risk Baseline:  Goal status: INITIAL  5.  Pt to demonstrate at least 4+/5 bil hip strength for improved pelvic stability and functional squats without leakage.  Baseline:  Goal status: INITIAL  PLAN:  PT FREQUENCY: 2x/week  PT DURATION: 8 weeks  PLANNED INTERVENTIONS: 97110-Therapeutic exercises, 97530- Therapeutic activity, [id number]- Neuromuscular re-education, 97535- Self Care, [id number]- Manual therapy, [phone]- Gait training, [phone]- Aquatic Therapy, [phone]- Electrical stimulation (manual), [id number]- Vasopneumatic device, [id number]- Ultrasound, [id number]- Ionotophoresis 4mg /ml Dexamethasone, Patient/Family education, Balance training, Stair training, Taping, Dry Needling, Joint mobilization, Spinal mobilization, Scar mobilization, DME instructions, Cryotherapy, Moist heat, and Biofeedback  PLAN FOR NEXT SESSION: continue LE strengthening, standing tolerance ;standing balance, core strengthening   Angeline Trick B. Wildon Cuevas, PT 02/26/24 11:31 AM [facility] [address] [place], [place] [address] Phone # [phone] Fax [phone]

## 2024-03-02 ENCOUNTER — Ambulatory Visit: Payer: 59

## 2024-03-02 DIAGNOSIS — M6281 Muscle weakness (generalized): Secondary | ICD-10-CM

## 2024-03-02 DIAGNOSIS — R29898 Other symptoms and signs involving the musculoskeletal system: Secondary | ICD-10-CM

## 2024-03-02 DIAGNOSIS — R269 Unspecified abnormalities of gait and mobility: Secondary | ICD-10-CM

## 2024-03-02 DIAGNOSIS — R296 Repeated falls: Secondary | ICD-10-CM

## 2024-03-02 DIAGNOSIS — R262 Difficulty in walking, not elsewhere classified: Secondary | ICD-10-CM

## 2024-03-02 DIAGNOSIS — R252 Cramp and spasm: Secondary | ICD-10-CM

## 2024-03-02 NOTE — Therapy (Signed)
 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)    HIV infection (HCC)    Internal hemorrhoids    Obesity    Rectal ulcer    Past Surgical History:  Procedure Laterality Date   IR IMAGING GUIDED PORT INSERTION  11/18/2022   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   Neutropenia (HCC) 06/26/2023   Rash 06/26/2023   Intractable back pain suspect Granix-related 02/21/2023   Lactic acidosis 02/21/2023   Thrombocytopenia (HCC) 02/01/2023   Hyponatremia 01/31/2023   Hypokalemia 01/23/2023   Hematuria 01/11/2023   Neutropenic fever (HCC) 01/10/2023   Pancytopenia (HCC) 01/10/2023   Acute cystitis 01/10/2023   Anemia 01/01/2023   Healthcare maintenance 12/19/2022   Encounter for chemotherapy management 12/12/2022   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HCC) 12/10/2022   Decreased appetite 12/02/2022   Drug-induced constipation 11/21/2022   Nausea without vomiting 11/21/2022   Acute nonintractable headache 11/21/2022   Encounter for antineoplastic chemotherapy 11/18/2022   Burkitt lymphoma of lymph nodes of multiple regions [facility]) 11/14/2022   Counseling regarding advance care planning and goals of care 11/14/2022   Chest mass 11/10/2022   Exposure to chlamydia 10/24/2022   Submental lymphadenopathy 09/05/2022   Goals of care, counseling/discussion  07/26/2022   History of syphilis 07/26/2022   Neck swelling 07/26/2022   AIDS (acquired immune deficiency syndrome) (HCC) 07/25/2022    PCP: Johney Maine, MD  REFERRING PROVIDER: Johney Maine, MD  REFERRING DIAG: C83.78 (ICD-10-CM) - Burkitt lymphoma of lymph nodes of multiple regions [facility])  THERAPY DIAG:  Muscle weakness (generalized)  Abnormality of gait and mobility  Rationale for Evaluation and Treatment: Rehabilitation  ONSET DATE: 6-7 months  SUBJECTIVE:  SUBJECTIVE STATEMENT: Pt reports no new issues just had some cramping this morning in the leg but it started in his hand.    Eval: Rt leg weakness and gives out sometimes, cramps throughout the night and sometimes while walking. Uses SPC and this helps a lot with prevention of falling when it gives out.  No pattern with walking or standing time with weakness.  4-5 falls. Does have numbness and tingling in both legs throughout bil thighs Did finish cancer treatments last year, does still port. Chemo completed. Wasn't very active during chemo with exhausted and feels so weak now.    PAIN: 03/02/2024 Are you having pain? Yes NPRS scale: 6/10 Pain location: bil thighs  Pain type: aching and dull does report sharp with cramping at night Pain description: constant   Aggravating factors: cramping at night is highest pain; walking, standing Relieving factors: sitting to rest  PRECAUTIONS: Other: recent cancer   RED FLAGS: None   WEIGHT BEARING RESTRICTIONS: No  FALLS:  Has patient fallen in last 6 months? Yes. Number of falls 5 (most recent Sunday)  LIVING ENVIRONMENT: Lives with: lives with their family Lives in: House/apartment Stairs: No Has following equipment at home: Single point cane, FWW  OCCUPATION: off  work right now, CNA  PLOF: Independent  PATIENT GOALS: to feel stronger and get back to how I was before cancer  PERTINENT HISTORY:  HIV/AIDs; syphilis, rapidly growing right cervical nodal mass extending into the level 2 nodal station of the right supraclavicular nodal station and extension into the superficial right sternocleidomastoid muscle.  There is also hypermetabolic right axillary nodal disease.diagnosed in November/December 2023 Sexual abuse: NO  NEXT MD VISIT: 04/05/24 - oncologist  OBJECTIVE:  Note: Objective measures were completed at Evaluation unless otherwise noted.  DIAGNOSTIC FINDINGS:   Xray - hip 01/27/24 Mild degenerative changes to the hip joint  CT NECK WITH CONTRAST 12/28/23  IMPRESSION: 1. Resolution of the large indistinctly marginated mass lesion throughout the anterior lower neck, previously more extensive on the right than the left. Mild scarring of the right sternocleidomastoid muscle but no evidence of recurrent mass lesion. 2. Stable appearance of level 1 lymph nodes, the largest showing short axis dimension of 1 cm. Previously seen mass/adenopathy in the right level 2 region has resolved without residual. Other cervical chain lymph nodes on both sides of the neck are within normal limits in size.   PATIENT SURVEYS:  LEFS Lower Extremity Functional Score: 22 / 80 = 27.5 %  indicative of moderate functional limitation   COGNITION: Overall cognitive status: Within functional limits for tasks assessed     SENSATION: Numbness in both thighs worse with pain, can feel pressure but light touch   POSTURE: rounded shoulders and anterior pelvic tilt  PALPATION: TTP at Rt greater trochanter, tightness noted in Rt hip flexor, Rt lumbar paraspinal  LOWER EXTREMITY ROM:  WFL without pain for full flexion however at end range extension pt reports pain at proximal lateral hip on Rt  LOWER EXTREMITY MMT:  MMT Right Eval */5 Left eval  Hip flexion  3 4+  Hip extension 3+ 4+  Hip abduction 3+ 4  Hip adduction 3+ 4+  Hip internal rotation    Hip external rotation    Knee flexion 3+ 4  Knee extension 3 4  Ankle dorsiflexion 3+ 4+  Ankle plantarflexion    Ankle inversion    Ankle eversion     (Blank rows = not tested)   FUNCTIONAL TESTS:  5 times sit to stand: 24.06s with use of hands and felt 7/10 on BORG with this  GAIT: Distance walked: 150' Assistive device utilized: Single point cane Level of assistance: Modified independence Comments: greatly decreased cadence, decreased step length and height at Rt leg                                                                                                                                TREATMENT DATE:  03/02/24 (on time today) NuStep Level 3 x 10 mins- PT present to discuss status Gait training: 140 feet with SPC (2 standing rest breaks , occasionally uses wall for support) Seated LAQ 3# x 10 on right and 8 lbs on left  3 way stability ball stretch x 10 each direction Seated knee flexion with slider x 20 bilateral  Seated hip flexion with 3# 2 x 10  02/24/24 (8 min late for appt) NuStep Level 3 x 10 mins- PT present to discuss status Gait training: 128 feet with SPC (one standing rest break , occasionally uses wall for support) Seated hamstring stretch 2 x 30 sec bilateral  3 way stability ball stretch x 10 each direction Seated knee flexion with slider x 20 bilateral  Seated LAQ 3# x 10 on right and 8 lbs on left  Sit to stand x 10 from mat table (patient able to do 8 today)  02/24/24 (7 min late for appt) NuStep Level 3 x 6 mins- PT present to discuss status Seated hamstring stretch 2 x 30 sec bilateral  3 way stability ball stretch x 10 each direction Seated knee flexion with slider x 20 bilateral  Seated LAQ 3# x 10 on left and 4 lbs on right  Sit to stand x 10 from mat + foam pad (use of both hands) Gait training: from cancer PT area to chair in long  hallway  PATIENT EDUCATION:  Education details: [email or web address] Person educated: Patient Education method: Explanation, Demonstration, Tactile cues, Verbal cues, and Handouts Education comprehension: verbalized understanding, returned demonstration, verbal cues required, tactile cues required, and needs further education  HOME EXERCISE PROGRAM: Access Code: [email or web address] URL: [email or web address] Date: 02/03/2024 Prepared by: Raynelle Fanning  Exercises - Supine March  - 1 x daily - 7 x weekly - 2 sets - 10 reps - Bent Knee Fallouts  - 1 x daily - 7 x weekly - 2 sets - 10 reps - Supine Hip Adductor Squeeze with Small Ball  - 1 x daily - 7 x weekly - 2 sets - 10 reps - Sit to Stand  - 3 x daily - 7 x weekly - 1-2 sets - 5-10 reps - Hooklying Clamshell with Resistance  - 1 x daily - 3 x weekly - 2 sets - 10 reps  ASSESSMENT:  CLINICAL IMPRESSION: Bartholomew is progressing appropriately.  He is still quite weak in the right LE but improving functionally.  Motivation is improving.    He would benefit  from continued skilled PT for LE strengthening and fall prevention.     Eval: Patient is a 43 y.o. male  who was seen today for physical therapy evaluation and treatment for Rt leg weakness. Pt has complex history of Burkitt lymphoma with chemo treatments now completed. Pt reports he was exhausted during his treatments and feels like he is very weak and fatigued now. Does have numbness in bil thighs only posterior and anteriorly and feels like his Rt leg "gives out" to the point of him having 5 falls in the last several months. Pt does demonstrate gait deficits, very slow cadence and heavy trunk lean onto [facility] which pt uses on RT hand reporting he has more able to catch himself with the cane him this hand if his knee "gives". Pt found to have decreased strength bil LE but worse in Rt leg. Pt demonstrated high fall risk based on 5xSTS and after completing this was very fatigued and needed several minutes to  recover stating 7/10 on BORG scale. LEFS score 22 / 80. 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   OBJECTIVE IMPAIRMENTS: Abnormal gait, decreased activity tolerance, decreased balance, decreased coordination, decreased endurance, decreased knowledge of use of DME, decreased mobility, difficulty walking, decreased strength, increased fascial restrictions, impaired perceived functional ability, increased muscle spasms, impaired flexibility, impaired sensation, improper body mechanics, postural dysfunction, and pain.   ACTIVITY LIMITATIONS: carrying, lifting, bending, sitting, standing, squatting, stairs, transfers, bed mobility, and locomotion level  PARTICIPATION LIMITATIONS: cleaning, laundry, interpersonal relationship, driving, shopping, community activity, occupation, and yard work  PERSONAL FACTORS: Fitness, Time since onset of injury/illness/exacerbation, and 1 comorbidity: medical history   are also affecting patient's functional outcome.   REHAB POTENTIAL: Good  CLINICAL DECISION MAKING: Evolving/moderate complexity  EVALUATION COMPLEXITY: Moderate   GOALS: Goals reviewed with patient? Yes  SHORT TERM GOALS: Target date: 02/25/24 Pt to be I with HEP.  Baseline: Goal status: In progress  2.  Pt to report improved LEFS score of 30 to improve toward lowering functional impairment limitations.  Baseline:  Goal status: INITIAL  3.  Pt to demonstrate ability to complete body weight squats without hands for 10 reps to improve LE strength for tolerance for walking.  Baseline:  Goal status: In Progress  4.  Pt to demonstrate improved gait mechanics with even step length and height to be able to decreased fall risk with community ambulation.   Baseline:  Goal status: In progress   LONG TERM GOALS: Target date: 03/24/24  Pt to be I with advanced HEP.  Baseline:  Goal status: INITIAL  2.  Pt  to demonstrate 10# squats without compensatory strategies for improved hip strength for tolerance to return to work Baseline:  Goal status: INITIAL  3.  Pt to report ability to walk with or without AD for at least 30 mins with no increase in pain for tolerance to return to work.  Baseline:  Goal status: INITIAL  4.  Pt to demonstrate 5xSTS in 7s to achieve age related norm for decreased fall risk Baseline:  Goal status: INITIAL  5.  Pt to demonstrate at least 4+/5 bil hip strength for improved pelvic stability and functional squats without leakage.  Baseline:  Goal status: INITIAL   PLAN:  PT FREQUENCY: 2x/week  PT DURATION: 8 weeks  PLANNED INTERVENTIONS: 97110-Therapeutic exercises, 97530- Therapeutic activity, [id number]- Neuromuscular re-education, 97535- Self Care, [id number]- Manual  therapy, [id number]- Gait training, [id number]- Aquatic Therapy, [id number]- Electrical stimulation (manual), [id number]- Vasopneumatic device, [id number]- Ultrasound, [id number]- Ionotophoresis 4mg /ml Dexamethasone, Patient/Family education, Balance training, Stair training, Taping, Dry Needling, Joint mobilization, Spinal mobilization, Scar mobilization, DME instructions, Cryotherapy, Moist heat, and Biofeedback  PLAN FOR NEXT SESSION: Increased walking distance, continue LE strengthening, standing tolerance ;standing balance, core strengthening   Prabhjot Piscitello B. Luby Seamans, PT 03/02/24 8:09 PM [facility] [address], [address] [place], [place] [address] Phone # [phone] Fax [phone]

## 2024-03-03 ENCOUNTER — Other Ambulatory Visit (HOSPITAL_COMMUNITY): Payer: Self-pay

## 2024-03-04 ENCOUNTER — Ambulatory Visit: Payer: 59

## 2024-03-04 DIAGNOSIS — R269 Unspecified abnormalities of gait and mobility: Secondary | ICD-10-CM

## 2024-03-04 DIAGNOSIS — M6281 Muscle weakness (generalized): Secondary | ICD-10-CM | POA: Diagnosis not present

## 2024-03-04 DIAGNOSIS — C8378 Burkitt lymphoma, lymph nodes of multiple sites: Secondary | ICD-10-CM

## 2024-03-04 DIAGNOSIS — R262 Difficulty in walking, not elsewhere classified: Secondary | ICD-10-CM

## 2024-03-04 DIAGNOSIS — R296 Repeated falls: Secondary | ICD-10-CM

## 2024-03-04 DIAGNOSIS — R29898 Other symptoms and signs involving the musculoskeletal system: Secondary | ICD-10-CM

## 2024-03-04 DIAGNOSIS — R252 Cramp and spasm: Secondary | ICD-10-CM

## 2024-03-04 NOTE — Therapy (Signed)
 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)    HIV infection (HCC)    Internal hemorrhoids    Obesity    Rectal ulcer    Past Surgical History:  Procedure Laterality Date   IR IMAGING GUIDED PORT INSERTION  11/18/2022   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   Neutropenia (HCC) 06/26/2023   Rash 06/26/2023   Intractable back pain suspect Granix-related 02/21/2023   Lactic acidosis 02/21/2023   Thrombocytopenia (HCC) 02/01/2023   Hyponatremia 01/31/2023   Hypokalemia 01/23/2023   Hematuria 01/11/2023   Neutropenic fever (HCC) 01/10/2023   Pancytopenia (HCC) 01/10/2023   Acute cystitis 01/10/2023   Anemia 01/01/2023   Healthcare maintenance 12/19/2022   Encounter for chemotherapy management 12/12/2022   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HCC) 12/10/2022   Decreased appetite 12/02/2022   Drug-induced constipation 11/21/2022   Nausea without vomiting 11/21/2022   Acute nonintractable headache 11/21/2022   Encounter for antineoplastic chemotherapy 11/18/2022   Burkitt lymphoma of lymph nodes of multiple regions [facility]) 11/14/2022   Counseling regarding advance care planning and goals of care 11/14/2022   Chest mass 11/10/2022   Exposure to chlamydia 10/24/2022   Submental lymphadenopathy 09/05/2022   Goals of care, counseling/discussion  07/26/2022   History of syphilis 07/26/2022   Neck swelling 07/26/2022   AIDS (acquired immune deficiency syndrome) (HCC) 07/25/2022    PCP: Johney Maine, MD  REFERRING PROVIDER: Johney Maine, MD  REFERRING DIAG: C83.78 (ICD-10-CM) - Burkitt lymphoma of lymph nodes of multiple regions [facility])  THERAPY DIAG:  Muscle weakness (generalized)  Abnormality of gait and mobility  Rationale for Evaluation and Treatment: Rehabilitation  ONSET DATE: 6-7 months  SUBJECTIVE:  SUBJECTIVE STATEMENT: Pt reports doing ok.  No issues with increasing walking distance.  Patient is still quite weak in right LE.    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  We discussed his plans to return to work and he explains that he definitely wants to return to work but does not want to be a liability.   Eval: Rt leg weakness and gives out sometimes, cramps throughout the night and sometimes while walking. Uses SPC and this helps a lot with prevention of falling when it gives out.  No pattern with walking or standing time with weakness.  4-5 falls. Does have numbness and tingling in both legs throughout bil thighs Did finish cancer treatments last year, does still port. Chemo completed. Wasn't very active during chemo with exhausted and feels so weak now.    PAIN: 03/04/2024 Are you having pain? Yes NPRS scale: 7/10 Pain location: bil thighs  Pain type: aching and dull does report sharp with cramping at night Pain description: constant   Aggravating factors: cramping at night is highest pain; walking, standing Relieving factors: sitting to rest  PRECAUTIONS: Other: recent cancer   RED FLAGS: None   WEIGHT BEARING  RESTRICTIONS: No  FALLS:  Has patient fallen in last 6 months? Yes. Number of falls 5 (most recent Sunday)  LIVING ENVIRONMENT: Lives with: lives with their family Lives in: House/apartment Stairs: No Has following equipment at home: Single point cane, FWW  OCCUPATION: off work right now, CNA  PLOF: Independent  PATIENT GOALS: to feel stronger and get back to how I was before cancer  PERTINENT HISTORY:  HIV/AIDs; syphilis, rapidly growing right cervical nodal mass extending into the level 2 nodal station of the right supraclavicular nodal station and extension into the superficial right sternocleidomastoid muscle.  There is also hypermetabolic right axillary nodal disease.diagnosed in November/December 2023 Sexual abuse: NO  NEXT MD VISIT: 04/05/24 - oncologist  OBJECTIVE:  Note: Objective measures were completed at Evaluation unless otherwise noted.  DIAGNOSTIC FINDINGS:   Xray - hip 01/27/24 Mild degenerative changes to the hip joint  CT NECK WITH CONTRAST 12/28/23  IMPRESSION: 1. Resolution of the large indistinctly marginated mass lesion throughout the anterior lower neck, previously more extensive on the right than the left. Mild scarring of the right sternocleidomastoid muscle but no evidence of recurrent mass lesion. 2. Stable appearance of level 1 lymph nodes, the largest showing short axis dimension of 1 cm. Previously seen mass/adenopathy in the right level 2 region has resolved without residual. Other cervical chain lymph nodes on both sides of the neck are within normal limits in size.   PATIENT SURVEYS:  LEFS Lower Extremity Functional Score: 22 / 80 = 27.5 %  indicative of moderate functional limitation   COGNITION: Overall cognitive status: Within functional limits for tasks assessed     SENSATION: Numbness in both thighs worse with pain, can feel pressure but light touch   POSTURE: rounded shoulders and anterior pelvic tilt  PALPATION: TTP at Rt  greater trochanter, tightness noted in Rt hip flexor, Rt lumbar paraspinal  LOWER EXTREMITY ROM:  WFL without pain for full flexion however at end range extension pt reports pain at proximal lateral hip on Rt  LOWER EXTREMITY MMT:  MMT Right Eval */5 Left eval  Hip flexion 3 4+  Hip extension 3+ 4+  Hip abduction  3+ 4  Hip adduction 3+ 4+  Hip internal rotation    Hip external rotation    Knee flexion 3+ 4  Knee extension 3 4  Ankle dorsiflexion 3+ 4+  Ankle plantarflexion    Ankle inversion    Ankle eversion     (Blank rows = not tested)   FUNCTIONAL TESTS:  5 times sit to stand: 24.06s with use of hands and felt 7/10 on BORG with this  GAIT: Distance walked: 150' Assistive device utilized: Single point cane Level of assistance: Modified independence Comments: greatly decreased cadence, decreased step length and height at Rt leg                                                                                                                                TREATMENT DATE:  03/04/24 (11 min late) NuStep Level 4 x 10 mins- PT present to discuss status Gait training: 168 feet with SPC (2 standing rest breaks , occasionally uses wall for support) Seated LAQ 3# x 10 on right and 8 lbs on left  Seated knee flexion with slider x 20 bilateral  Seated hip flexion with 3# 2 x 10  03/02/24 (on time today) NuStep Level 3 x 10 mins- PT present to discuss status Gait training: 140 feet with SPC (2 standing rest breaks , occasionally uses wall for support) Seated LAQ 3# x 10 on right and 8 lbs on left  3 way stability ball stretch x 10 each direction Seated knee flexion with slider x 20 bilateral  Seated hip flexion with 3# 2 x 10  02/24/24 (8 min late for appt) NuStep Level 3 x 10 mins- PT present to discuss status Gait training: 128 feet with SPC (one standing rest break , occasionally uses wall for support) Seated hamstring stretch 2 x 30 sec bilateral  3 way stability ball  stretch x 10 each direction Seated knee flexion with slider x 20 bilateral  Seated LAQ 3# x 10 on right and 8 lbs on left  Sit to stand x 10 from mat table (patient able to do 8 today)   PATIENT EDUCATION:  Education details: [id number] Person educated: Patient Education method: Explanation, Demonstration, Tactile cues, Verbal cues, and Handouts Education comprehension: verbalized understanding, returned demonstration, verbal cues required, tactile cues required, and needs further education  HOME EXERCISE PROGRAM: Access Code: [email or web address] URL: [email or web address] Date: 02/03/2024 Prepared by: Raynelle Fanning  Exercises - Supine March  - 1 x daily - 7 x weekly - 2 sets - 10 reps - Bent Knee Fallouts  - 1 x daily - 7 x weekly - 2 sets - 10 reps - Supine Hip Adductor Squeeze with Small Ball  - 1 x daily - 7 x weekly - 2 sets - 10 reps - Sit to Stand  - 3 x daily - 7 x weekly - 1-2 sets - 5-10 reps - Hooklying Clamshell with Resistance  - 1 x daily -  3 x weekly - 2 sets - 10 reps  ASSESSMENT:  CLINICAL IMPRESSION: Lannie was late for today's appointment so treatment time was limited.  He was able to walk an additional 28 feet today.  However, he continues to walk slowly and guarded, somewhat dragging the right foot.  He demonstrates right knee instability at times.   He would benefit from continued skilled PT for LE strengthening and fall prevention.     Eval: Patient is a 43 y.o. male  who was seen today for physical therapy evaluation and treatment for Rt leg weakness. Pt has complex history of Burkitt lymphoma with chemo treatments now completed. Pt reports he was exhausted during his treatments and feels like he is very weak and fatigued now. Does have numbness in bil thighs only posterior and anteriorly and feels like his Rt leg "gives out" to the point of him having 5 falls in the last several months. Pt does demonstrate gait deficits, very slow cadence and heavy trunk lean onto [employer]  which pt uses on RT hand reporting he has more able to catch himself with the cane him this hand if his knee "gives". Pt found to have decreased strength bil LE but worse in Rt leg. Pt demonstrated high fall risk based on 5xSTS and after completing this was very fatigued and needed several minutes to recover stating 7/10 on BORG scale. LEFS score 22 / 80. 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   OBJECTIVE IMPAIRMENTS: Abnormal gait, decreased activity tolerance, decreased balance, decreased coordination, decreased endurance, decreased knowledge of use of DME, decreased mobility, difficulty walking, decreased strength, increased fascial restrictions, impaired perceived functional ability, increased muscle spasms, impaired flexibility, impaired sensation, improper body mechanics, postural dysfunction, and pain.   ACTIVITY LIMITATIONS: carrying, lifting, bending, sitting, standing, squatting, stairs, transfers, bed mobility, and locomotion level  PARTICIPATION LIMITATIONS: cleaning, laundry, interpersonal relationship, driving, shopping, community activity, occupation, and yard work  PERSONAL FACTORS: Fitness, Time since onset of injury/illness/exacerbation, and 1 comorbidity: medical history   are also affecting patient's functional outcome.   REHAB POTENTIAL: Good  CLINICAL DECISION MAKING: Evolving/moderate complexity  EVALUATION COMPLEXITY: Moderate   GOALS: Goals reviewed with patient? Yes  SHORT TERM GOALS: Target date: 02/25/24 Pt to be I with HEP.  Baseline: Goal status: In progress  2.  Pt to report improved LEFS score of 30 to improve toward lowering functional impairment limitations.  Baseline:  Goal status: INITIAL  3.  Pt to demonstrate ability to complete body weight squats without hands for 10 reps to improve LE strength for tolerance for walking.  Baseline:  Goal status: In  Progress  4.  Pt to demonstrate improved gait mechanics with even step length and height to be able to decreased fall risk with community ambulation.   Baseline:  Goal status: In progress   LONG TERM GOALS: Target date: 03/24/24  Pt to be I with advanced HEP.  Baseline:  Goal status: INITIAL  2.  Pt to demonstrate 10# squats without compensatory strategies for improved hip strength for tolerance to return to work Baseline:  Goal status: INITIAL  3.  Pt to report ability to walk with or without AD for at least 30 mins with no increase in pain for tolerance to return to work.  Baseline:  Goal status: INITIAL  4.  Pt to demonstrate 5xSTS in 7s to achieve age related norm for  decreased fall risk Baseline:  Goal status: INITIAL  5.  Pt to demonstrate at least 4+/5 bil hip strength for improved pelvic stability and functional squats without leakage.  Baseline:  Goal status: INITIAL   PLAN:  PT FREQUENCY: 2x/week  PT DURATION: 8 weeks  PLANNED INTERVENTIONS: 97110-Therapeutic exercises, 97530- Therapeutic activity, [id number]- Neuromuscular re-education, 97535- Self Care, [id number]- Manual therapy, [phone]- Gait training, [phone]- Aquatic Therapy, [phone]- Electrical stimulation (manual), [id number]- Vasopneumatic device, [id number]- Ultrasound, [id number]- Ionotophoresis 4mg /ml Dexamethasone, Patient/Family education, Balance training, Stair training, Taping, Dry Needling, Joint mobilization, Spinal mobilization, Scar mobilization, DME instructions, Cryotherapy, Moist heat, and Biofeedback  PLAN FOR NEXT SESSION: Increased walking distance, continue LE strengthening, standing tolerance ;standing balance, core strengthening   Kayloni Rocco B. Royal Vandevoort, PT 03/04/24 2:20 PM [facility] [name] [place], [place] [address] Phone # [phone] Fax [phone]

## 2024-03-08 ENCOUNTER — Other Ambulatory Visit: Payer: Self-pay

## 2024-03-09 ENCOUNTER — Ambulatory Visit: Payer: 59

## 2024-03-10 ENCOUNTER — Other Ambulatory Visit: Payer: Self-pay | Admitting: Pharmacy Technician

## 2024-03-10 ENCOUNTER — Other Ambulatory Visit: Payer: Self-pay

## 2024-03-10 NOTE — Progress Notes (Signed)
 Specialty Pharmacy Refill Coordination Note  GOVIND FUREY is a 43 y.o. male contacted today regarding refills of specialty medication(s) Bictegravir-Emtricitab-Tenofov [name]   Patient requested Delivery   Delivery date: 03/12/24   Verified address: [address] [place]   Medication will be filled on 03/11/24.

## 2024-03-11 ENCOUNTER — Ambulatory Visit: Payer: 59

## 2024-03-11 DIAGNOSIS — R269 Unspecified abnormalities of gait and mobility: Secondary | ICD-10-CM

## 2024-03-11 DIAGNOSIS — R262 Difficulty in walking, not elsewhere classified: Secondary | ICD-10-CM

## 2024-03-11 DIAGNOSIS — C8378 Burkitt lymphoma, lymph nodes of multiple sites: Secondary | ICD-10-CM

## 2024-03-11 DIAGNOSIS — R29898 Other symptoms and signs involving the musculoskeletal system: Secondary | ICD-10-CM

## 2024-03-11 DIAGNOSIS — R252 Cramp and spasm: Secondary | ICD-10-CM

## 2024-03-11 DIAGNOSIS — M6281 Muscle weakness (generalized): Secondary | ICD-10-CM

## 2024-03-11 DIAGNOSIS — R296 Repeated falls: Secondary | ICD-10-CM

## 2024-03-11 NOTE — Therapy (Signed)
 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)    HIV infection (HCC)    Internal hemorrhoids    Obesity    Rectal ulcer    Past Surgical History:  Procedure Laterality Date   IR IMAGING GUIDED PORT INSERTION  11/18/2022   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   Neutropenia (HCC) 06/26/2023   Rash 06/26/2023   Intractable back pain suspect Granix-related 02/21/2023   Lactic acidosis 02/21/2023   Thrombocytopenia (HCC) 02/01/2023   Hyponatremia 01/31/2023   Hypokalemia 01/23/2023   Hematuria 01/11/2023   Neutropenic fever (HCC) 01/10/2023   Pancytopenia (HCC) 01/10/2023   Acute cystitis 01/10/2023   Anemia 01/01/2023   Healthcare maintenance 12/19/2022   Encounter for chemotherapy management 12/12/2022   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HCC) 12/10/2022   Decreased appetite 12/02/2022   Drug-induced constipation 11/21/2022   Nausea without vomiting 11/21/2022   Acute nonintractable headache 11/21/2022   Encounter for antineoplastic chemotherapy 11/18/2022   Burkitt lymphoma of lymph nodes of multiple regions [facility]) 11/14/2022   Counseling regarding advance care planning and goals of care 11/14/2022   Chest mass 11/10/2022   Exposure to chlamydia 10/24/2022   Submental lymphadenopathy 09/05/2022   Goals of care, counseling/discussion  07/26/2022   History of syphilis 07/26/2022   Neck swelling 07/26/2022   AIDS (acquired immune deficiency syndrome) (HCC) 07/25/2022    PCP: Johney Maine, MD  REFERRING PROVIDER: Johney Maine, MD  REFERRING DIAG: C83.78 (ICD-10-CM) - Burkitt lymphoma of lymph nodes of multiple regions [facility])  THERAPY DIAG:  Muscle weakness (generalized)  Abnormality of gait and mobility  Rationale for Evaluation and Treatment: Rehabilitation  ONSET DATE: 6-7 months  SUBJECTIVE:  SUBJECTIVE STATEMENT: Pt reports no issues other than "just tired"  Eval: Rt leg weakness and gives out sometimes, cramps throughout the night and sometimes while walking. Uses SPC and this helps a lot with prevention of falling when it gives out.  No pattern with walking or standing time with weakness.  4-5 falls. Does have numbness and tingling in both legs throughout bil thighs Did finish cancer treatments last year, does still port. Chemo completed. Wasn't very active during chemo with exhausted and feels so weak now.    PAIN: 03/04/2024 Are you having pain? Yes NPRS scale: 7/10 Pain location: bil thighs  Pain type: aching and dull does report sharp with cramping at night Pain description: constant   Aggravating factors: cramping at night is highest pain; walking, standing Relieving factors: sitting to rest  PRECAUTIONS: Other: recent cancer   RED FLAGS: None   WEIGHT BEARING RESTRICTIONS: No  FALLS:  Has patient fallen in last 6 months? Yes. Number of falls 5 (most recent Sunday)  LIVING ENVIRONMENT: Lives with: lives with their family Lives in: House/apartment Stairs: No Has following equipment at home: Single point cane, FWW  OCCUPATION: off work right now, CNA  PLOF: Independent  PATIENT  GOALS: to feel stronger and get back to how I was before cancer  PERTINENT HISTORY:  HIV/AIDs; syphilis, rapidly growing right cervical nodal mass extending into the level 2 nodal station of the right supraclavicular nodal station and extension into the superficial right sternocleidomastoid muscle.  There is also hypermetabolic right axillary nodal disease.diagnosed in November/December 2023 Sexual abuse: NO  NEXT MD VISIT: 04/05/24 - oncologist  OBJECTIVE:  Note: Objective measures were completed at Evaluation unless otherwise noted.  DIAGNOSTIC FINDINGS:   Xray - hip 01/27/24 Mild degenerative changes to the hip joint  CT NECK WITH CONTRAST 12/28/23  IMPRESSION: 1. Resolution of the large indistinctly marginated mass lesion throughout the anterior lower neck, previously more extensive on the right than the left. Mild scarring of the right sternocleidomastoid muscle but no evidence of recurrent mass lesion. 2. Stable appearance of level 1 lymph nodes, the largest showing short axis dimension of 1 cm. Previously seen mass/adenopathy in the right level 2 region has resolved without residual. Other cervical chain lymph nodes on both sides of the neck are within normal limits in size.   PATIENT SURVEYS:  LEFS Lower Extremity Functional Score: 22 / 80 = 27.5 %  indicative of moderate functional limitation   COGNITION: Overall cognitive status: Within functional limits for tasks assessed     SENSATION: Numbness in both thighs worse with pain, can feel pressure but light touch   POSTURE: rounded shoulders and anterior pelvic tilt  PALPATION: TTP at Rt greater trochanter, tightness noted in Rt hip flexor, Rt lumbar paraspinal  LOWER EXTREMITY ROM:  WFL without pain for full flexion however at end range extension pt reports pain at proximal lateral hip on Rt  LOWER EXTREMITY MMT:  MMT Right Eval */5 Left eval  Hip flexion 3 4+  Hip extension 3+ 4+  Hip abduction 3+ 4   Hip adduction 3+ 4+  Hip internal rotation    Hip external rotation    Knee flexion 3+ 4  Knee extension 3 4  Ankle dorsiflexion 3+ 4+  Ankle plantarflexion    Ankle inversion    Ankle eversion     (Blank rows = not tested)   FUNCTIONAL TESTS:  5 times sit to stand: 24.06s with use of hands and felt 7/10 on  BORG with this  GAIT: Distance walked: 150' Assistive device utilized: Single point cane Level of assistance: Modified independence Comments: greatly decreased cadence, decreased step length and height at Rt leg                                                                                                                                TREATMENT DATE:  03/11/24 (13 min late) NuStep Level 4 x 10 mins- PT present to discuss status Gait training: 144 feet with SPC (2 standing rest breaks , occasionally uses wall for support) Seated LAQ 3# x 10 on right and 8 lbs on left  Seated march with 3# on right and 8# on left x 20 (10 each side) Seated clamshell x 20 with red loop  Discussed attendance with patient :  he has 3 visits left, encouraged more consistent attendance and being on time to be able to do effective therapy  03/04/24 (11 min late) NuStep Level 4 x 10 mins- PT present to discuss status Gait training: 168 feet with SPC (2 standing rest breaks , occasionally uses wall for support) Seated LAQ 3# x 10 on right and 8 lbs on left  Seated knee flexion with slider x 20 bilateral  Seated hip flexion with 3# 2 x 10  03/02/24 (on time today) NuStep Level 3 x 10 mins- PT present to discuss status Gait training: 140 feet with SPC (2 standing rest breaks , occasionally uses wall for support) Seated LAQ 3# x 10 on right and 8 lbs on left  3 way stability ball stretch x 10 each direction Seated knee flexion with slider x 20 bilateral  Seated hip flexion with 3# 2 x 10  PATIENT EDUCATION:  Education details: [email or web address] Person educated: Patient Education method: Explanation,  Demonstration, Tactile cues, Verbal cues, and Handouts Education comprehension: verbalized understanding, returned demonstration, verbal cues required, tactile cues required, and needs further education  HOME EXERCISE PROGRAM: Access Code: [email or web address] URL: [email or web address] Date: 02/03/2024 Prepared by: Raynelle Fanning  Exercises - Supine March  - 1 x daily - 7 x weekly - 2 sets - 10 reps - Bent Knee Fallouts  - 1 x daily - 7 x weekly - 2 sets - 10 reps - Supine Hip Adductor Squeeze with Small Ball  - 1 x daily - 7 x weekly - 2 sets - 10 reps - Sit to Stand  - 3 x daily - 7 x weekly - 1-2 sets - 5-10 reps - Hooklying Clamshell with Resistance  - 1 x daily - 3 x weekly - 2 sets - 10 reps  ASSESSMENT:  CLINICAL IMPRESSION: Logan Ward was late again today. Treatment time was limited.  He was not able to do much today.  He continues to walk slowly and guarded, somewhat dragging the right foot despite vc's. He mentions that he does that to avoid falling.  He has not shown any episodes of knee instability in the past few  visits.   He would benefit from continued skilled PT for LE strengthening and fall prevention but will need to be more consistent to do effective PT.     Eval: Patient is a 43 y.o. male  who was seen today for physical therapy evaluation and treatment for Rt leg weakness. Pt has complex history of Burkitt lymphoma with chemo treatments now completed. Pt reports he was exhausted during his treatments and feels like he is very weak and fatigued now. Does have numbness in bil thighs only posterior and anteriorly and feels like his Rt leg "gives out" to the point of him having 5 falls in the last several months. Pt does demonstrate gait deficits, very slow cadence and heavy trunk lean onto [facility] which pt uses on RT hand reporting he has more able to catch himself with the cane him this hand if his knee "gives". Pt found to have decreased strength bil LE but worse in Rt leg. Pt demonstrated  high fall risk based on 5xSTS and after completing this was very fatigued and needed several minutes to recover stating 7/10 on BORG scale. LEFS score 22 / 80. 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   OBJECTIVE IMPAIRMENTS: Abnormal gait, decreased activity tolerance, decreased balance, decreased coordination, decreased endurance, decreased knowledge of use of DME, decreased mobility, difficulty walking, decreased strength, increased fascial restrictions, impaired perceived functional ability, increased muscle spasms, impaired flexibility, impaired sensation, improper body mechanics, postural dysfunction, and pain.   ACTIVITY LIMITATIONS: carrying, lifting, bending, sitting, standing, squatting, stairs, transfers, bed mobility, and locomotion level  PARTICIPATION LIMITATIONS: cleaning, laundry, interpersonal relationship, driving, shopping, community activity, occupation, and yard work  PERSONAL FACTORS: Fitness, Time since onset of injury/illness/exacerbation, and 1 comorbidity: medical history   are also affecting patient's functional outcome.   REHAB POTENTIAL: Good  CLINICAL DECISION MAKING: Evolving/moderate complexity  EVALUATION COMPLEXITY: Moderate   GOALS: Goals reviewed with patient? Yes  SHORT TERM GOALS: Target date: 02/25/24 Pt to be I with HEP.  Baseline: Goal status: In progress  2.  Pt to report improved LEFS score of 30 to improve toward lowering functional impairment limitations.  Baseline:  Goal status: INITIAL  3.  Pt to demonstrate ability to complete body weight squats without hands for 10 reps to improve LE strength for tolerance for walking.  Baseline:  Goal status: In Progress  4.  Pt to demonstrate improved gait mechanics with even step length and height to be able to decreased fall risk with community ambulation.   Baseline:  Goal status: In progress   LONG TERM  GOALS: Target date: 03/24/24  Pt to be I with advanced HEP.  Baseline:  Goal status: INITIAL  2.  Pt to demonstrate 10# squats without compensatory strategies for improved hip strength for tolerance to return to work Baseline:  Goal status: INITIAL  3.  Pt to report ability to walk with or without AD for at least 30 mins with no increase in pain for tolerance to return to work.  Baseline:  Goal status: INITIAL  4.  Pt to demonstrate 5xSTS in 7s to achieve age related norm for decreased fall risk Baseline:  Goal status: INITIAL  5.  Pt to demonstrate at least 4+/5 bil hip strength for improved pelvic stability and functional squats without leakage.  Baseline:  Goal status: INITIAL   PLAN:  PT FREQUENCY: 2x/week  PT DURATION: 8  weeks  PLANNED INTERVENTIONS: 97110-Therapeutic exercises, 97530- Therapeutic activity, [id number]- Neuromuscular re-education, [phone]- Self Care, 13086- Manual therapy, [phone]- Gait training, [phone]- Aquatic Therapy, [phone]- Electrical stimulation (manual), [id number]- Vasopneumatic device, [id number]- Ultrasound, [id number]- Ionotophoresis 4mg /ml Dexamethasone, Patient/Family education, Balance training, Stair training, Taping, Dry Needling, Joint mobilization, Spinal mobilization, Scar mobilization, DME instructions, Cryotherapy, Moist heat, and Biofeedback  PLAN FOR NEXT SESSION: Increased walking distance, continue LE strengthening, standing tolerance ;standing balance, core strengthening   Josilyn Shippee B. Jessikah Dicker, PT 03/11/24 11:07 AM [facility] [address], [address] [place], [place] [address] Phone # [phone] Fax [phone]

## 2024-03-16 ENCOUNTER — Ambulatory Visit: Payer: 59 | Attending: Hematology

## 2024-03-16 DIAGNOSIS — R262 Difficulty in walking, not elsewhere classified: Secondary | ICD-10-CM | POA: Diagnosis present

## 2024-03-16 DIAGNOSIS — C8378 Burkitt lymphoma, lymph nodes of multiple sites: Secondary | ICD-10-CM | POA: Insufficient documentation

## 2024-03-16 DIAGNOSIS — M6281 Muscle weakness (generalized): Secondary | ICD-10-CM | POA: Diagnosis present

## 2024-03-16 DIAGNOSIS — R29898 Other symptoms and signs involving the musculoskeletal system: Secondary | ICD-10-CM | POA: Diagnosis present

## 2024-03-16 DIAGNOSIS — R296 Repeated falls: Secondary | ICD-10-CM | POA: Diagnosis present

## 2024-03-16 DIAGNOSIS — R252 Cramp and spasm: Secondary | ICD-10-CM | POA: Diagnosis present

## 2024-03-16 DIAGNOSIS — R269 Unspecified abnormalities of gait and mobility: Secondary | ICD-10-CM | POA: Diagnosis present

## 2024-03-16 DIAGNOSIS — R293 Abnormal posture: Secondary | ICD-10-CM | POA: Diagnosis present

## 2024-03-16 NOTE — Therapy (Signed)
 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)    HIV infection (HCC)    Internal hemorrhoids    Obesity    Rectal ulcer    Past Surgical History:  Procedure Laterality Date   IR IMAGING GUIDED PORT INSERTION  11/18/2022   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   Neutropenia (HCC) 06/26/2023   Rash 06/26/2023   Intractable back pain suspect Granix-related 02/21/2023   Lactic acidosis 02/21/2023   Thrombocytopenia (HCC) 02/01/2023   Hyponatremia 01/31/2023   Hypokalemia 01/23/2023   Hematuria 01/11/2023   Neutropenic fever (HCC) 01/10/2023   Pancytopenia (HCC) 01/10/2023   Acute cystitis 01/10/2023   Anemia 01/01/2023   Healthcare maintenance 12/19/2022   Encounter for chemotherapy management 12/12/2022   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HCC) 12/10/2022   Decreased appetite 12/02/2022   Drug-induced constipation 11/21/2022   Nausea without vomiting 11/21/2022   Acute nonintractable headache 11/21/2022   Encounter for antineoplastic chemotherapy 11/18/2022   Burkitt lymphoma of lymph nodes of multiple regions [facility]) 11/14/2022   Counseling regarding advance care planning and goals of care 11/14/2022   Chest mass 11/10/2022   Exposure to chlamydia 10/24/2022   Submental lymphadenopathy 09/05/2022   Goals of care, counseling/discussion  07/26/2022   History of syphilis 07/26/2022   Neck swelling 07/26/2022   AIDS (acquired immune deficiency syndrome) (HCC) 07/25/2022    PCP: Johney Maine, MD  REFERRING PROVIDER: Johney Maine, MD  REFERRING DIAG: C83.78 (ICD-10-CM) - Burkitt lymphoma of lymph nodes of multiple regions [facility])  THERAPY DIAG:  Muscle weakness (generalized)  Abnormality of gait and mobility  Rationale for Evaluation and Treatment: Rehabilitation  ONSET DATE: 6-7 months  SUBJECTIVE:  SUBJECTIVE STATEMENT: Pt reports "I'm actually doing pretty good today.  I have a friend that was visiting this weekend and she is a PT so she helped me exercise".    Eval: Rt leg weakness and gives out sometimes, cramps throughout the night and sometimes while walking. Uses SPC and this helps a lot with prevention of falling when it gives out.  No pattern with walking or standing time with weakness.  4-5 falls. Does have numbness and tingling in both legs throughout bil thighs Did finish cancer treatments last year, does still port. Chemo completed. Wasn't very active during chemo with exhausted and feels so weak now.    PAIN: 03/16/2024 Are you having pain? Yes NPRS scale: 7/10 Pain location: bil thighs  Pain type: aching and dull does report sharp with cramping at night Pain description: constant   Aggravating factors: cramping at night is highest pain; walking, standing Relieving factors: sitting to rest  PRECAUTIONS: Other: recent cancer   RED FLAGS: None   WEIGHT BEARING RESTRICTIONS: No  FALLS:  Has patient fallen in last 6 months? Yes. Number of falls 5 (most recent Sunday)  LIVING ENVIRONMENT: Lives with: lives with their family Lives in: House/apartment Stairs: No Has following equipment at home:  Single point cane, FWW  OCCUPATION: off work right now, CNA  PLOF: Independent  PATIENT GOALS: to feel stronger and get back to how I was before cancer  PERTINENT HISTORY:  HIV/AIDs; syphilis, rapidly growing right cervical nodal mass extending into the level 2 nodal station of the right supraclavicular nodal station and extension into the superficial right sternocleidomastoid muscle.  There is also hypermetabolic right axillary nodal disease.diagnosed in November/December 2023 Sexual abuse: NO  NEXT MD VISIT: 04/05/24 - oncologist  OBJECTIVE:  Note: Objective measures were completed at Evaluation unless otherwise noted.  DIAGNOSTIC FINDINGS:   Xray - hip 01/27/24 Mild degenerative changes to the hip joint  CT NECK WITH CONTRAST 12/28/23  IMPRESSION: 1. Resolution of the large indistinctly marginated mass lesion throughout the anterior lower neck, previously more extensive on the right than the left. Mild scarring of the right sternocleidomastoid muscle but no evidence of recurrent mass lesion. 2. Stable appearance of level 1 lymph nodes, the largest showing short axis dimension of 1 cm. Previously seen mass/adenopathy in the right level 2 region has resolved without residual. Other cervical chain lymph nodes on both sides of the neck are within normal limits in size.   PATIENT SURVEYS:  LEFS Lower Extremity Functional Score: 22 / 80 = 27.5 %  indicative of moderate functional limitation   COGNITION: Overall cognitive status: Within functional limits for tasks assessed     SENSATION: Numbness in both thighs worse with pain, can feel pressure but light touch   POSTURE: rounded shoulders and anterior pelvic tilt  PALPATION: TTP at Rt greater trochanter, tightness noted in Rt hip flexor, Rt lumbar paraspinal  LOWER EXTREMITY ROM:  WFL without pain for full flexion however at end range extension pt reports pain at proximal lateral hip on Rt  LOWER EXTREMITY MMT:  MMT  Right Eval */5 Left eval  Hip flexion 3 4+  Hip extension 3+ 4+  Hip abduction 3+ 4  Hip adduction 3+ 4+  Hip internal rotation    Hip external rotation    Knee flexion 3+ 4  Knee extension 3 4  Ankle dorsiflexion 3+ 4+  Ankle plantarflexion    Ankle inversion    Ankle eversion     (Blank rows =  not tested)   FUNCTIONAL TESTS:  5 times sit to stand: 24.06s with use of hands and felt 7/10 on BORG with this  GAIT: Distance walked: 150' Assistive device utilized: Single point cane Level of assistance: Modified independence Comments: greatly decreased cadence, decreased step length and height at Rt leg                                                                                                                                TREATMENT DATE:  03/16/24  NuStep Level 4 x 12 mins- PT present to discuss status Gait training: 115 feet with SPC no rest breaks and no need for wall assistance Leg Press bilateral 70 # 2 x 10, singles 2 x 10 with 50 # Seated LAQ 3# x 10 on right and 8 lbs on left  Seated march with 3# on right and 8# on left x 20 (10 each side) Seated clamshell x 20 with red loop  Sit to stand x 5  03/11/24 (13 min late) NuStep Level 4 x 10 mins- PT present to discuss status Gait training: 144 feet with SPC (2 standing rest breaks , occasionally uses wall for support) Seated LAQ 3# x 10 on right and 8 lbs on left  Seated march with 3# on right and 8# on left x 20 (10 each side) Seated clamshell x 20 with red loop  Discussed attendance with patient :  he has 3 visits left, encouraged more consistent attendance and being on time to be able to do effective therapy  03/04/24 (11 min late) NuStep Level 4 x 10 mins- PT present to discuss status Gait training: 168 feet with SPC (2 standing rest breaks , occasionally uses wall for support) Seated LAQ 3# x 10 on right and 8 lbs on left  Seated knee flexion with slider x 20 bilateral  Seated hip flexion with 3# 2 x  10  PATIENT EDUCATION:  Education details: [email or web address] Person educated: Patient Education method: Explanation, Demonstration, Tactile cues, Verbal cues, and Handouts Education comprehension: verbalized understanding, returned demonstration, verbal cues required, tactile cues required, and needs further education  HOME EXERCISE PROGRAM: Access Code: [email or web address] URL: [email or web address] Date: 02/03/2024 Prepared by: Raynelle Fanning  Exercises - Supine March  - 1 x daily - 7 x weekly - 2 sets - 10 reps - Bent Knee Fallouts  - 1 x daily - 7 x weekly - 2 sets - 10 reps - Supine Hip Adductor Squeeze with Small Ball  - 1 x daily - 7 x weekly - 2 sets - 10 reps - Sit to Stand  - 3 x daily - 7 x weekly - 1-2 sets - 5-10 reps - Hooklying Clamshell with Resistance  - 1 x daily - 3 x weekly - 2 sets - 10 reps  ASSESSMENT:  CLINICAL IMPRESSION: Tage was on time today. He was able to ambulate with SPC without dragging right foot today.  Heel strike was  consistent.  He does still ambulate with slight toeing out on right. We added leg press today and he did very well  He would benefit from continued skilled PT for LE strengthening and fall prevention but will need to be more consistent to do effective PT.     Eval: Patient is a 43 y.o. male  who was seen today for physical therapy evaluation and treatment for Rt leg weakness. Pt has complex history of Burkitt lymphoma with chemo treatments now completed. Pt reports he was exhausted during his treatments and feels like he is very weak and fatigued now. Does have numbness in bil thighs only posterior and anteriorly and feels like his Rt leg "gives out" to the point of him having 5 falls in the last several months. Pt does demonstrate gait deficits, very slow cadence and heavy trunk lean onto [facility] which pt uses on RT hand reporting he has more able to catch himself with the cane him this hand if his knee "gives". Pt found to have decreased strength bil LE  but worse in Rt leg. Pt demonstrated high fall risk based on 5xSTS and after completing this was very fatigued and needed several minutes to recover stating 7/10 on BORG scale. LEFS score 22 / 80. 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   OBJECTIVE IMPAIRMENTS: Abnormal gait, decreased activity tolerance, decreased balance, decreased coordination, decreased endurance, decreased knowledge of use of DME, decreased mobility, difficulty walking, decreased strength, increased fascial restrictions, impaired perceived functional ability, increased muscle spasms, impaired flexibility, impaired sensation, improper body mechanics, postural dysfunction, and pain.   ACTIVITY LIMITATIONS: carrying, lifting, bending, sitting, standing, squatting, stairs, transfers, bed mobility, and locomotion level  PARTICIPATION LIMITATIONS: cleaning, laundry, interpersonal relationship, driving, shopping, community activity, occupation, and yard work  PERSONAL FACTORS: Fitness, Time since onset of injury/illness/exacerbation, and 1 comorbidity: medical history   are also affecting patient's functional outcome.   REHAB POTENTIAL: Good  CLINICAL DECISION MAKING: Evolving/moderate complexity  EVALUATION COMPLEXITY: Moderate   GOALS: Goals reviewed with patient? Yes  SHORT TERM GOALS: Target date: 02/25/24 Pt to be I with HEP.  Baseline: Goal status: In progress  2.  Pt to report improved LEFS score of 30 to improve toward lowering functional impairment limitations.  Baseline:  Goal status: INITIAL  3.  Pt to demonstrate ability to complete body weight squats without hands for 10 reps to improve LE strength for tolerance for walking.  Baseline:  Goal status: In Progress  4.  Pt to demonstrate improved gait mechanics with even step length and height to be able to decreased fall risk with community ambulation.   Baseline:  Goal  status: In progress   LONG TERM GOALS: Target date: 03/24/24  Pt to be I with advanced HEP.  Baseline:  Goal status: INITIAL  2.  Pt to demonstrate 10# squats without compensatory strategies for improved hip strength for tolerance to return to work Baseline:  Goal status: INITIAL  3.  Pt to report ability to walk with or without AD for at least 30 mins with no increase in pain for tolerance to return to work.  Baseline:  Goal status: INITIAL  4.  Pt to demonstrate 5xSTS in 7s to achieve age related norm for decreased fall risk Baseline:  Goal status: INITIAL  5.  Pt to demonstrate at least 4+/5 bil hip strength for improved pelvic stability and functional  squats without leakage.  Baseline:  Goal status: INITIAL   PLAN:  PT FREQUENCY: 2x/week  PT DURATION: 8 weeks  PLANNED INTERVENTIONS: 97110-Therapeutic exercises, 97530- Therapeutic activity, [id number]- Neuromuscular re-education, 97535- Self Care, [id number]- Manual therapy, [phone]- Gait training, [phone]- Aquatic Therapy, [phone]- Electrical stimulation (manual), [id number]- Vasopneumatic device, [id number]- Ultrasound, [id number]- Ionotophoresis 4mg /ml Dexamethasone, Patient/Family education, Balance training, Stair training, Taping, Dry Needling, Joint mobilization, Spinal mobilization, Scar mobilization, DME instructions, Cryotherapy, Moist heat, and Biofeedback  PLAN FOR NEXT SESSION: Increased walking distance, continue LE strengthening, standing tolerance ;standing balance, core strengthening   Maybel Dambrosio B. Sakiya Stepka, PT 03/16/24 7:14 PM [facility] [address] [place], [place] [address] Phone # [phone] Fax [phone]

## 2024-03-18 ENCOUNTER — Ambulatory Visit: Payer: 59

## 2024-03-18 DIAGNOSIS — R296 Repeated falls: Secondary | ICD-10-CM

## 2024-03-18 DIAGNOSIS — R252 Cramp and spasm: Secondary | ICD-10-CM

## 2024-03-18 DIAGNOSIS — M6281 Muscle weakness (generalized): Secondary | ICD-10-CM

## 2024-03-18 DIAGNOSIS — C8378 Burkitt lymphoma, lymph nodes of multiple sites: Secondary | ICD-10-CM

## 2024-03-18 DIAGNOSIS — R269 Unspecified abnormalities of gait and mobility: Secondary | ICD-10-CM

## 2024-03-18 DIAGNOSIS — R262 Difficulty in walking, not elsewhere classified: Secondary | ICD-10-CM

## 2024-03-18 DIAGNOSIS — R29898 Other symptoms and signs involving the musculoskeletal system: Secondary | ICD-10-CM

## 2024-03-18 NOTE — Therapy (Addendum)
 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)    HIV infection (HCC)    Internal hemorrhoids    Obesity    Rectal ulcer    Past Surgical History:  Procedure Laterality Date   IR IMAGING GUIDED PORT INSERTION  11/18/2022   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   Neutropenia (HCC) 06/26/2023   Rash 06/26/2023   Intractable back pain suspect Granix-related 02/21/2023   Lactic acidosis 02/21/2023   Thrombocytopenia (HCC) 02/01/2023   Hyponatremia 01/31/2023   Hypokalemia 01/23/2023   Hematuria 01/11/2023   Neutropenic fever (HCC) 01/10/2023   Pancytopenia (HCC) 01/10/2023   Acute cystitis 01/10/2023   Anemia 01/01/2023   Healthcare maintenance 12/19/2022   Encounter for chemotherapy management 12/12/2022   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HCC) 12/10/2022   Decreased appetite 12/02/2022   Drug-induced constipation 11/21/2022   Nausea without vomiting 11/21/2022   Acute nonintractable headache 11/21/2022   Encounter for antineoplastic chemotherapy 11/18/2022   Burkitt lymphoma of lymph nodes of multiple regions [facility]) 11/14/2022   Counseling regarding advance care planning and goals of care 11/14/2022   Chest mass 11/10/2022   Exposure to chlamydia 10/24/2022   Submental lymphadenopathy 09/05/2022   Goals of care, counseling/discussion  07/26/2022   History of syphilis 07/26/2022   Neck swelling 07/26/2022   AIDS (acquired immune deficiency syndrome) (HCC) 07/25/2022    PCP: Logan Maine, MD  REFERRING PROVIDER: Johney Maine, MD  REFERRING DIAG: C83.78 (ICD-10-CM) - Burkitt lymphoma of lymph nodes of multiple regions [facility])  THERAPY DIAG:  Muscle weakness (generalized)  Abnormality of gait and mobility  Rationale for Evaluation and Treatment: Rehabilitation  ONSET DATE: 6-7 months  SUBJECTIVE:  SUBJECTIVE STATEMENT: Pt is 17 min late for appt.  States he is feeling pretty good today.    Eval: Rt leg weakness and gives out sometimes, cramps throughout the night and sometimes while walking. Uses SPC and this helps a lot with prevention of falling when it gives out.  No pattern with walking or standing time with weakness.  4-5 falls. Does have numbness and tingling in both legs throughout bil thighs Did finish cancer treatments last year, does still port. Chemo completed. Wasn't very active during chemo with exhausted and feels so weak now.    PAIN: 03/16/2024 Are you having pain? Yes NPRS scale: 7/10 Pain location: bil thighs  Pain type: aching and dull does report sharp with cramping at night Pain description: constant   Aggravating factors: cramping at night is highest pain; walking, standing Relieving factors: sitting to rest  PRECAUTIONS: Other: recent cancer   RED FLAGS: None   WEIGHT BEARING RESTRICTIONS: No  FALLS:  Has patient fallen in last 6 months? Yes. Number of falls 5 (most recent Sunday)  LIVING ENVIRONMENT: Lives with: lives with their family Lives in: House/apartment Stairs: No Has following equipment at home: Single point cane, FWW  OCCUPATION: off work right now, CNA  PLOF:  Independent  PATIENT GOALS: to feel stronger and get back to how I was before cancer  PERTINENT HISTORY:  HIV/AIDs; syphilis, rapidly growing right cervical nodal mass extending into the level 2 nodal station of the right supraclavicular nodal station and extension into the superficial right sternocleidomastoid muscle.  There is also hypermetabolic right axillary nodal disease.diagnosed in November/December 2023 Sexual abuse: NO  NEXT MD VISIT: 04/05/24 - oncologist  OBJECTIVE:  Note: Objective measures were completed at Evaluation unless otherwise noted.  DIAGNOSTIC FINDINGS:   Xray - hip 01/27/24 Mild degenerative changes to the hip joint  CT NECK WITH CONTRAST 12/28/23  IMPRESSION: 1. Resolution of the large indistinctly marginated mass lesion throughout the anterior lower neck, previously more extensive on the right than the left. Mild scarring of the right sternocleidomastoid muscle but no evidence of recurrent mass lesion. 2. Stable appearance of level 1 lymph nodes, the largest showing short axis dimension of 1 cm. Previously seen mass/adenopathy in the right level 2 region has resolved without residual. Other cervical chain lymph nodes on both sides of the neck are within normal limits in size.   PATIENT SURVEYS:  LEFS Lower Extremity Functional Score: 22 / 80 = 27.5 %  indicative of moderate functional limitation   COGNITION: Overall cognitive status: Within functional limits for tasks assessed     SENSATION: Numbness in both thighs worse with pain, can feel pressure but light touch   POSTURE: rounded shoulders and anterior pelvic tilt  PALPATION: TTP at Rt greater trochanter, tightness noted in Rt hip flexor, Rt lumbar paraspinal  LOWER EXTREMITY ROM:  WFL without pain for full flexion however at end range extension pt reports pain at proximal lateral hip on Rt  LOWER EXTREMITY MMT:  MMT Right Eval */5 Left eval  Hip flexion 3 4+  Hip extension 3+ 4+   Hip abduction 3+ 4  Hip adduction 3+ 4+  Hip internal rotation    Hip external rotation    Knee flexion 3+ 4  Knee extension 3 4  Ankle dorsiflexion 3+ 4+  Ankle plantarflexion    Ankle inversion    Ankle eversion     (Blank rows = not tested)   FUNCTIONAL TESTS:  5 times sit to stand:  24.06s with use of hands and felt 7/10 on BORG with this  GAIT: Distance walked: 150' Assistive device utilized: Single point cane Level of assistance: Modified independence Comments: greatly decreased cadence, decreased step length and height at Rt leg                                                                                                                                TREATMENT DATE:  03/18/24 (17 min late for appt) Gait training: 271 feet with SPC no rest breaks and no need for wall assistance with no right foot dragging NuStep Level 4 x 8 mins- PT present to discuss status In // bars: hip extension 2 x 10 In // bars: hip abduction 2 x 10  03/16/24  NuStep Level 4 x 12 mins- PT present to discuss status Gait training: 115 feet with SPC no rest breaks and no need for wall assistance Leg Press bilateral 70 # 2 x 10, singles 2 x 10 with 50 # Seated LAQ 3# x 10 on right and 8 lbs on left  Seated march with 3# on right and 8# on left x 20 (10 each side) Seated clamshell x 20 with red loop  Sit to stand x 5  03/11/24 (13 min late) NuStep Level 4 x 10 mins- PT present to discuss status Gait training: 144 feet with SPC (2 standing rest breaks , occasionally uses wall for support) Seated LAQ 3# x 10 on right and 8 lbs on left  Seated march with 3# on right and 8# on left x 20 (10 each side) Seated clamshell x 20 with red loop  Discussed attendance with patient :  he has 3 visits left, encouraged more consistent attendance and being on time to be able to do effective therapy   PATIENT EDUCATION:  Education details: [id number] Person educated: Patient Education method: Explanation,  Demonstration, Tactile cues, Verbal cues, and Handouts Education comprehension: verbalized understanding, returned demonstration, verbal cues required, tactile cues required, and needs further education  HOME EXERCISE PROGRAM: Access Code: [email or web address] URL: [email or web address] Date: 02/03/2024 Prepared by: Raynelle Fanning  Exercises - Supine March  - 1 x daily - 7 x weekly - 2 sets - 10 reps - Bent Knee Fallouts  - 1 x daily - 7 x weekly - 2 sets - 10 reps - Supine Hip Adductor Squeeze with Small Ball  - 1 x daily - 7 x weekly - 2 sets - 10 reps - Sit to Stand  - 3 x daily - 7 x weekly - 1-2 sets - 5-10 reps - Hooklying Clamshell with Resistance  - 1 x daily - 3 x weekly - 2 sets - 10 reps  ASSESSMENT:  CLINICAL IMPRESSION: Logan Ward was 17 min late today. His gait continues to improve.  He was able to walk 271 feet today with no right foot dragging and with improved step length.  He is still using SPC.   We were  unable to do much due to time constraints.  We discussed attendance and importance of having enough time each session to progress through plan of care.  He has re-assessment next visit.  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   He would benefit from continued skilled PT for LE strengthening and fall prevention but will need to be more consistent to do effective PT.     Eval: Patient is a 43 y.o. male  who was seen today for physical therapy evaluation and treatment for Rt leg weakness. Pt has complex history of Burkitt lymphoma with chemo treatments now completed. Pt reports he was exhausted during his treatments and feels like he is very weak and fatigued now. Does have numbness in bil thighs only posterior and anteriorly and feels like his Rt leg "gives out" to the point of him having 5 falls in the last several months. Pt does demonstrate gait deficits, very slow cadence and  heavy trunk lean onto [facility] which pt uses on RT hand reporting he has more able to catch himself with the cane him this hand if his knee "gives". Pt found to have decreased strength bil LE but worse in Rt leg. Pt demonstrated high fall risk based on 5xSTS and after completing this was very fatigued and needed several minutes to recover stating 7/10 on BORG scale. LEFS score 22 / 80. 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   OBJECTIVE IMPAIRMENTS: Abnormal gait, decreased activity tolerance, decreased balance, decreased coordination, decreased endurance, decreased knowledge of use of DME, decreased mobility, difficulty walking, decreased strength, increased fascial restrictions, impaired perceived functional ability, increased muscle spasms, impaired flexibility, impaired sensation, improper body mechanics, postural dysfunction, and pain.   ACTIVITY LIMITATIONS: carrying, lifting, bending, sitting, standing, squatting, stairs, transfers, bed mobility, and locomotion level  PARTICIPATION LIMITATIONS: cleaning, laundry, interpersonal relationship, driving, shopping, community activity, occupation, and yard work  PERSONAL FACTORS: Fitness, Time since onset of injury/illness/exacerbation, and 1 comorbidity: medical history   are also affecting patient's functional outcome.   REHAB POTENTIAL: Good  CLINICAL DECISION MAKING: Evolving/moderate complexity  EVALUATION COMPLEXITY: Moderate   GOALS: Goals reviewed with patient? Yes  SHORT TERM GOALS: Target date: 02/25/24 Pt to be I with HEP.  Baseline: Goal status: In progress  2.  Pt to report improved LEFS score of 30 to improve toward lowering functional impairment limitations.  Baseline:  Goal status: INITIAL  3.  Pt to demonstrate ability to complete body weight squats without hands for 10 reps to improve LE strength for tolerance for walking.  Baseline:   Goal status: In Progress  4.  Pt to demonstrate improved gait mechanics with even step length and height to be able to decreased fall risk with community ambulation.   Baseline:  Goal status: In progress   LONG TERM GOALS: Target date: 03/24/24  Pt to be I with advanced HEP.  Baseline:  Goal status: INITIAL  2.  Pt to demonstrate 10# squats without compensatory strategies for improved hip strength for tolerance to return to work Baseline:  Goal status: INITIAL  3.  Pt to report ability to walk with or without AD for at least 30 mins with no increase in pain for tolerance to return to work.  Baseline:  Goal status: INITIAL  4.  Pt to demonstrate 5xSTS in 7s to achieve age related norm for decreased fall risk Baseline:  Goal status: INITIAL  5.  Pt to demonstrate at least 4+/5 bil hip strength for improved pelvic stability and functional squats without leakage.  Baseline:  Goal status: INITIAL   PLAN:  PT FREQUENCY: 2x/week  PT DURATION: 8 weeks  PLANNED INTERVENTIONS: 97110-Therapeutic exercises, 97530- Therapeutic activity, [id number]- Neuromuscular re-education, 97535- Self Care, [id number]- Manual therapy, [phone]- Gait training, [phone]- Aquatic Therapy, [phone]- Electrical stimulation (manual), [id number]- Vasopneumatic device, [id number]- Ultrasound, [id number]- Ionotophoresis 4mg /ml Dexamethasone, Patient/Family education, Balance training, Stair training, Taping, Dry Needling, Joint mobilization, Spinal mobilization, Scar mobilization, DME instructions, Cryotherapy, Moist heat, and Biofeedback  PLAN FOR NEXT SESSION: Increase walking distance, progress to more standing activities and add band walks, continue LE strengthening, standing tolerance ;standing balance, core strengthening   Cloa Bushong B. Kodie Pick, PT 03/18/24 3:59 PM [facility] [address] [place], [place] [address] Phone # [phone] Fax [phone]

## 2024-03-23 ENCOUNTER — Ambulatory Visit: Payer: 59

## 2024-03-23 DIAGNOSIS — R296 Repeated falls: Secondary | ICD-10-CM

## 2024-03-23 DIAGNOSIS — C8378 Burkitt lymphoma, lymph nodes of multiple sites: Secondary | ICD-10-CM

## 2024-03-23 DIAGNOSIS — R262 Difficulty in walking, not elsewhere classified: Secondary | ICD-10-CM

## 2024-03-23 DIAGNOSIS — M6281 Muscle weakness (generalized): Secondary | ICD-10-CM | POA: Diagnosis not present

## 2024-03-23 DIAGNOSIS — R252 Cramp and spasm: Secondary | ICD-10-CM

## 2024-03-23 DIAGNOSIS — R29898 Other symptoms and signs involving the musculoskeletal system: Secondary | ICD-10-CM

## 2024-03-23 NOTE — Therapy (Signed)
 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)    HIV infection (HCC)    Internal hemorrhoids    Obesity    Rectal ulcer    Past Surgical History:  Procedure Laterality Date   IR IMAGING GUIDED PORT INSERTION  11/18/2022   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   Neutropenia (HCC) 06/26/2023   Rash 06/26/2023   Intractable back pain suspect Granix-related 02/21/2023   Lactic acidosis 02/21/2023   Thrombocytopenia (HCC) 02/01/2023   Hyponatremia 01/31/2023   Hypokalemia 01/23/2023   Hematuria 01/11/2023   Neutropenic fever (HCC) 01/10/2023   Pancytopenia (HCC) 01/10/2023   Acute cystitis 01/10/2023   Anemia 01/01/2023   Healthcare maintenance 12/19/2022   Encounter for chemotherapy management 12/12/2022   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HCC) 12/10/2022   Decreased appetite 12/02/2022   Drug-induced constipation 11/21/2022   Nausea without vomiting 11/21/2022   Acute nonintractable headache 11/21/2022   Encounter for antineoplastic chemotherapy 11/18/2022   Burkitt lymphoma of lymph nodes of multiple regions [facility]) 11/14/2022   Counseling regarding advance care planning and goals of care 11/14/2022   Chest mass 11/10/2022   Exposure to chlamydia 10/24/2022   Submental lymphadenopathy 09/05/2022   Goals of care, counseling/discussion 07/26/2022   History of  syphilis 07/26/2022   Neck swelling 07/26/2022   AIDS (acquired immune deficiency syndrome) (HCC) 07/25/2022    PCP: Johney Maine, MD  REFERRING PROVIDER: Johney Maine, MD  REFERRING DIAG: C83.78 (ICD-10-CM) - Burkitt lymphoma of lymph nodes of multiple regions [facility])  THERAPY DIAG:  Muscle weakness (generalized)  Abnormality of gait and mobility  Rationale for Evaluation and Treatment: Rehabilitation  ONSET DATE: 6-7 months  SUBJECTIVE:  SUBJECTIVE STATEMENT: Pt is 10 min late for appt.  No falls since last visit.  Would like to keep doing formal therapy.  We discussed importance of attendance, having enough time to do full treatments and be able to progress.  Overall pain improving per patient report.    Eval: Rt leg weakness and gives out sometimes, cramps throughout the night and sometimes while walking. Uses SPC and this helps a lot with prevention of falling when it gives out.  No pattern with walking or standing time with weakness.  4-5 falls. Does have numbness and tingling in both legs throughout bil thighs Did finish cancer treatments last year, does still port. Chemo completed. Wasn't very active during chemo with exhausted and feels so weak now.    PAIN: 03/23/2024 Are you having pain? Yes NPRS scale: 6/10 Pain location: bil thighs  Pain type: aching and dull does report sharp with cramping at night Pain description: constant   Aggravating factors: cramping at night is highest pain; walking, standing Relieving factors: sitting to rest  PRECAUTIONS: Other: recent cancer   RED FLAGS: None   WEIGHT BEARING RESTRICTIONS: No  FALLS:  Has patient fallen in last 6 months? Yes. Number of falls 5 (most recent Sunday)  03/23/24: No falls in the past 4 weeks  LIVING  ENVIRONMENT: Lives with: lives with their family Lives in: House/apartment Stairs: No Has following equipment at home: Single point cane, FWW  OCCUPATION: off work right now, CNA  PLOF: Independent  PATIENT GOALS: to feel stronger and get back to how I was before cancer  PERTINENT HISTORY:  HIV/AIDs; syphilis, rapidly growing right cervical nodal mass extending into the level 2 nodal station of the right supraclavicular nodal station and extension into the superficial right sternocleidomastoid muscle.  There is also hypermetabolic right axillary nodal disease.diagnosed in November/December 2023 Sexual abuse: NO  NEXT MD VISIT: 04/05/24 - oncologist  OBJECTIVE:  Note: Objective measures were completed at Evaluation unless otherwise noted.  DIAGNOSTIC FINDINGS:   Xray - hip 01/27/24 Mild degenerative changes to the hip joint  CT NECK WITH CONTRAST 12/28/23  IMPRESSION: 1. Resolution of the large indistinctly marginated mass lesion throughout the anterior lower neck, previously more extensive on the right than the left. Mild scarring of the right sternocleidomastoid muscle but no evidence of recurrent mass lesion. 2. Stable appearance of level 1 lymph nodes, the largest showing short axis dimension of 1 cm. Previously seen mass/adenopathy in the right level 2 region has resolved without residual. Other cervical chain lymph nodes on both sides of the neck are within normal limits in size.   PATIENT SURVEYS:  Initial eval: LEFS Lower Extremity Functional Score: 22 / 80 = 27.5 %  indicative of moderate functional limitation   03/23/24: LEFS Lower Extremity Functional Score: 12 / 80 = 15 % indicative of severe functional limitation  (Patient explains he paid more attention to the details of the questions)  COGNITION: Overall cognitive status: Within functional limits for tasks assessed     SENSATION: Numbness in both thighs worse with pain, can feel pressure but light  touch   POSTURE: rounded shoulders and anterior pelvic tilt  PALPATION: TTP at Rt greater trochanter, tightness noted in Rt hip flexor, Rt lumbar paraspinal  LOWER EXTREMITY ROM:  WFL without pain for full flexion however at end range extension pt reports pain at proximal lateral hip on Rt  LOWER EXTREMITY MMT:  MMT Right Eval */5 Right  03/23/24 Left eval Left 03/23/24  Hip flexion 3 3 4+ 4+  Hip extension 3+ 3+ 4+ 4+  Hip abduction 3+ 4+ 4 5  Hip adduction 3+ 4+ 4+ 4+  Hip internal rotation      Hip external rotation      Knee flexion 3+ 4 4 4+  Knee extension 3 3+ 4 4+  Ankle dorsiflexion 3+ 4- 4+ 4+  Ankle plantarflexion      Ankle inversion      Ankle eversion       (Blank rows = not tested)   FUNCTIONAL TESTS:  Initial eval: 5 times sit to stand: 24.06s with use of hands and felt 7/10 on BORG with this  03/23/24: 5 times sit to stand: 17.85 s with use of hands and felt 3/10 on BORG with this  GAIT: Distance walked: 271' (completed last session) Assistive device utilized: Single point cane Level of assistance: Modified independence Comments: greatly decreased cadence, decreased step length and height at Rt leg                                                                                                                                TREATMENT DATE:  03/23/24 (10 min late) Nustep x 10 min  Re-assessment completed  Squats to chair with balance pad 2 x 10 Seated clam with yellow tband tied: 2 x 10 Side lying single leg clam with yellow tband tied 2 x 10   03/18/24 (17 min late for appt) Gait training: 271 feet with SPC no rest breaks and no need for wall assistance with no right foot dragging NuStep Level 4 x 8 mins- PT present to discuss status In // bars: hip extension 2 x 10 In // bars: hip abduction 2 x 10  03/16/24  NuStep Level 4 x 12 mins- PT present to discuss status Gait training: 115 feet with SPC no rest breaks and no need for wall assistance Leg  Press bilateral 70 # 2 x 10, singles 2 x 10 with 50 # Seated LAQ 3# x 10 on right and 8 lbs on left  Seated march with 3# on right and 8# on left x 20 (10 each side) Seated clamshell x 20 with red loop  Sit to stand x 5  PATIENT EDUCATION:  Education details: [id number] Person educated: Patient Education method: Explanation, Demonstration, Tactile cues, Verbal cues, and Handouts Education comprehension: verbalized understanding, returned demonstration, verbal cues required, tactile cues required, and needs further education  HOME EXERCISE PROGRAM: Access Code: [email or web address] URL: [email or web address] Date: 02/03/2024 Prepared by: Raynelle Fanning  Exercises - Supine March  - 1 x daily - 7 x weekly - 2 sets - 10 reps - Bent Knee Fallouts  - 1 x daily - 7 x weekly - 2 sets - 10 reps - Supine Hip Adductor Squeeze with Small Ball  - 1 x daily - 7 x weekly - 2 sets - 10 reps - Sit to Stand  - 3 x  daily - 7 x weekly - 1-2 sets - 5-10 reps - Hooklying Clamshell with Resistance  - 1 x daily - 3 x weekly - 2 sets - 10 reps  ASSESSMENT:  CLINICAL IMPRESSION: Nilton was 10 min late today. We did a re-assessment today and had a long discussion about attendance.  He is progressing despite his limited sessions but would likely make much better progress if we were able to complete more tasks each session.  His objective findings were all improved significantly with exception of his LEFS score.  He states he feels like he didn't pay close attention to the details of each question and may not have scored it accurately the first time.   He is ambulating with good heel to toe progression now but still guarded and slow.  He was able to walk 271 feet last session with no rest breaks and good heel strike on the right LE throughout the entire distance.  He would benefit from continued skilled PT for LE strengthening and fall prevention but will need to be more consistent and on time to do effective PT.     Eval:  Patient is a 43 y.o. male  who was seen today for physical therapy evaluation and treatment for Rt leg weakness. Pt has complex history of Burkitt lymphoma with chemo treatments now completed. Pt reports he was exhausted during his treatments and feels like he is very weak and fatigued now. Does have numbness in bil thighs only posterior and anteriorly and feels like his Rt leg "gives out" to the point of him having 5 falls in the last several months. Pt does demonstrate gait deficits, very slow cadence and heavy trunk lean onto [facility] which pt uses on RT hand reporting he has more able to catch himself with the cane him this hand if his knee "gives". Pt found to have decreased strength bil LE but worse in Rt leg. Pt demonstrated high fall risk based on 5xSTS and after completing this was very fatigued and needed several minutes to recover stating 7/10 on BORG scale. LEFS score 22 / 80. 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   OBJECTIVE IMPAIRMENTS: Abnormal gait, decreased activity tolerance, decreased balance, decreased coordination, decreased endurance, decreased knowledge of use of DME, decreased mobility, difficulty walking, decreased strength, increased fascial restrictions, impaired perceived functional ability, increased muscle spasms, impaired flexibility, impaired sensation, improper body mechanics, postural dysfunction, and pain.   ACTIVITY LIMITATIONS: carrying, lifting, bending, sitting, standing, squatting, stairs, transfers, bed mobility, and locomotion level  PARTICIPATION LIMITATIONS: cleaning, laundry, interpersonal relationship, driving, shopping, community activity, occupation, and yard work  PERSONAL FACTORS: Fitness, Time since onset of injury/illness/exacerbation, and 1 comorbidity: medical history   are also affecting patient's functional outcome.   REHAB POTENTIAL: Good  CLINICAL DECISION  MAKING: Evolving/moderate complexity  EVALUATION COMPLEXITY: Moderate   GOALS: Goals reviewed with patient? Yes  SHORT TERM GOALS: Target date: 02/25/24 Pt to be I with HEP.  Baseline: Goal status: In progress (compliance is poor)  2.  Pt to report improved LEFS score of 30 to improve toward lowering functional impairment limitations.  Baseline:  Goal status: In progress  3.  Pt to demonstrate ability to complete body weight squats without hands for 10 reps to improve LE strength for tolerance for walking.  Baseline:  Goal status: MET 03/23/24  4.  Pt to demonstrate improved gait mechanics  with even step length and height to be able to decreased fall risk with community ambulation.   Baseline:  Goal status: MET 03/23/24   LONG TERM GOALS: Target date: 05/18/24  Pt to be I with advanced HEP.  Baseline:  Goal status: In Progress  2.  Pt to demonstrate 10# squats without compensatory strategies for improved hip strength for tolerance to return to work Baseline:  Goal status: In Progress  3.  Pt to report ability to walk with or without AD for at least 30 mins with no increase in pain for tolerance to return to work.  Baseline:  Goal status: In Progress  4.  Pt to demonstrate 5xSTS in 7s to achieve age related norm for decreased fall risk Baseline:  Goal status: In Progress  5.  Pt to demonstrate at least 4+/5 bil hip strength for improved pelvic stability and functional squats without leakage.  Baseline:  Goal status: In Progress   PLAN:  PT FREQUENCY: 2x/week  PT DURATION: 8 weeks  PLANNED INTERVENTIONS: 97110-Therapeutic exercises, 97530- Therapeutic activity, [id number]- Neuromuscular re-education, 97535- Self Care, [id number]- Manual therapy, [id number]- Gait training, [phone]- Aquatic Therapy, [phone]- Electrical stimulation (manual), [id number]- Vasopneumatic device, [id number]- Ultrasound, [id number]- Ionotophoresis 4mg /ml Dexamethasone, Patient/Family education, Balance training, Stair training,  Taping, Dry Needling, Joint mobilization, Spinal mobilization, Scar mobilization, DME instructions, Cryotherapy, Moist heat, and Biofeedback  PLAN FOR NEXT SESSION: Recertifying for 8 more weeks.  Increase walking distance, progress to more standing activities and add band walks, continue LE strengthening, standing tolerance ;standing balance, core strengthening   Demonte Dobratz B. Jurni Cesaro, PT 03/23/24 11:24 AM [facility] [address] [place], [place] [address] Phone # [phone] Fax [phone]

## 2024-03-24 ENCOUNTER — Other Ambulatory Visit: Payer: Self-pay

## 2024-03-25 ENCOUNTER — Ambulatory Visit: Payer: 59

## 2024-03-25 DIAGNOSIS — R293 Abnormal posture: Secondary | ICD-10-CM

## 2024-03-25 DIAGNOSIS — R29898 Other symptoms and signs involving the musculoskeletal system: Secondary | ICD-10-CM

## 2024-03-25 DIAGNOSIS — R296 Repeated falls: Secondary | ICD-10-CM

## 2024-03-25 DIAGNOSIS — R262 Difficulty in walking, not elsewhere classified: Secondary | ICD-10-CM

## 2024-03-25 DIAGNOSIS — R252 Cramp and spasm: Secondary | ICD-10-CM

## 2024-03-25 DIAGNOSIS — R269 Unspecified abnormalities of gait and mobility: Secondary | ICD-10-CM

## 2024-03-25 DIAGNOSIS — M6281 Muscle weakness (generalized): Secondary | ICD-10-CM

## 2024-03-25 DIAGNOSIS — C8378 Burkitt lymphoma, lymph nodes of multiple sites: Secondary | ICD-10-CM

## 2024-03-25 NOTE — Therapy (Signed)
 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)    HIV infection (HCC)    Internal hemorrhoids    Obesity    Rectal ulcer    Past Surgical History:  Procedure Laterality Date   IR IMAGING GUIDED PORT INSERTION  11/18/2022   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   Neutropenia (HCC) 06/26/2023   Rash 06/26/2023   Intractable back pain suspect Granix-related 02/21/2023   Lactic acidosis 02/21/2023   Thrombocytopenia (HCC) 02/01/2023   Hyponatremia 01/31/2023   Hypokalemia 01/23/2023   Hematuria 01/11/2023   Neutropenic fever (HCC) 01/10/2023   Pancytopenia (HCC) 01/10/2023   Acute cystitis 01/10/2023   Anemia 01/01/2023   Healthcare maintenance 12/19/2022   Encounter for chemotherapy management 12/12/2022   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HCC) 12/10/2022   Decreased appetite 12/02/2022   Drug-induced constipation 11/21/2022   Nausea without vomiting 11/21/2022   Acute nonintractable headache 11/21/2022   Encounter for antineoplastic chemotherapy 11/18/2022   Burkitt lymphoma of lymph nodes of multiple regions [facility]) 11/14/2022   Counseling regarding advance care planning and goals of care 11/14/2022   Chest mass 11/10/2022   Exposure to chlamydia 10/24/2022   Submental lymphadenopathy 09/05/2022   Goals of care, counseling/discussion 07/26/2022   History of  syphilis 07/26/2022   Neck swelling 07/26/2022   AIDS (acquired immune deficiency syndrome) (HCC) 07/25/2022    PCP: Johney Maine, MD  REFERRING PROVIDER: Johney Maine, MD  REFERRING DIAG: C83.78 (ICD-10-CM) - Burkitt lymphoma of lymph nodes of multiple regions [facility])  THERAPY DIAG:  Muscle weakness (generalized)  Abnormality of gait and mobility  Rationale for Evaluation and Treatment: Rehabilitation  ONSET DATE: 6-7 months  SUBJECTIVE:  SUBJECTIVE STATEMENT: Patient on time today.  "I'm feeling good today".    Eval: Rt leg weakness and gives out sometimes, cramps throughout the night and sometimes while walking. Uses SPC and this helps a lot with prevention of falling when it gives out.  No pattern with walking or standing time with weakness.  4-5 falls. Does have numbness and tingling in both legs throughout bil thighs Did finish cancer treatments last year, does still port. Chemo completed. Wasn't very active during chemo with exhausted and feels so weak now.    PAIN: 03/23/2024 Are you having pain? Yes NPRS scale: 6/10 Pain location: bil thighs  Pain type: aching and dull does report sharp with cramping at night Pain description: constant   Aggravating factors: cramping at night is highest pain; walking, standing Relieving factors: sitting to rest  PRECAUTIONS: Other: recent cancer   RED FLAGS: None   WEIGHT BEARING RESTRICTIONS: No  FALLS:  Has patient fallen in last 6 months? Yes. Number of falls 5 (most recent Sunday)  03/23/24: No falls in the past 4 weeks  LIVING ENVIRONMENT: Lives with: lives with their family Lives in: House/apartment Stairs: No Has following equipment at home: Single point cane, FWW  OCCUPATION: off work right now, CNA  PLOF:  Independent  PATIENT GOALS: to feel stronger and get back to how I was before cancer  PERTINENT HISTORY:  HIV/AIDs; syphilis, rapidly growing right cervical nodal mass extending into the level 2 nodal station of the right supraclavicular nodal station and extension into the superficial right sternocleidomastoid muscle.  There is also hypermetabolic right axillary nodal disease.diagnosed in November/December 2023 Sexual abuse: NO  NEXT MD VISIT: 04/05/24 - oncologist  OBJECTIVE:  Note: Objective measures were completed at Evaluation unless otherwise noted.  DIAGNOSTIC FINDINGS:   Xray - hip 01/27/24 Mild degenerative changes to the hip joint  CT NECK WITH CONTRAST 12/28/23  IMPRESSION: 1. Resolution of the large indistinctly marginated mass lesion throughout the anterior lower neck, previously more extensive on the right than the left. Mild scarring of the right sternocleidomastoid muscle but no evidence of recurrent mass lesion. 2. Stable appearance of level 1 lymph nodes, the largest showing short axis dimension of 1 cm. Previously seen mass/adenopathy in the right level 2 region has resolved without residual. Other cervical chain lymph nodes on both sides of the neck are within normal limits in size.   PATIENT SURVEYS:  Initial eval: LEFS Lower Extremity Functional Score: 22 / 80 = 27.5 %  indicative of moderate functional limitation   03/23/24: LEFS Lower Extremity Functional Score: 12 / 80 = 15 % indicative of severe functional limitation  (Patient explains he paid more attention to the details of the questions)  COGNITION: Overall cognitive status: Within functional limits for tasks assessed     SENSATION: Numbness in both thighs worse with pain, can feel pressure but light touch   POSTURE: rounded shoulders and anterior pelvic tilt  PALPATION: TTP at Rt greater trochanter, tightness noted in Rt hip flexor, Rt lumbar paraspinal  LOWER EXTREMITY ROM:  WFL without  pain for full flexion however at end range extension pt reports pain at proximal lateral hip on Rt  LOWER EXTREMITY MMT:  MMT Right Eval */5 Right  03/23/24 Left eval Left 03/23/24  Hip flexion 3 3 4+ 4+  Hip extension 3+ 3+ 4+ 4+  Hip abduction 3+ 4+ 4 5  Hip adduction 3+ 4+ 4+ 4+  Hip internal rotation      Hip  external rotation      Knee flexion 3+ 4 4 4+  Knee extension 3 3+ 4 4+  Ankle dorsiflexion 3+ 4- 4+ 4+  Ankle plantarflexion      Ankle inversion      Ankle eversion       (Blank rows = not tested)   FUNCTIONAL TESTS:  Initial eval: 5 times sit to stand: 24.06s with use of hands and felt 7/10 on BORG with this  03/23/24: 5 times sit to stand: 17.85 s with use of hands and felt 3/10 on BORG with this  GAIT: Distance walked: 271' (completed last session) Assistive device utilized: Single point cane Level of assistance: Modified independence Comments: greatly decreased cadence, decreased step length and height at Rt leg                                                                                                                                TREATMENT DATE:  03/25/24 (on time) Gait training x 305 ft with minimal verbal cues for increased step length, heel strike and upright posture Sit to stand from mat table x 10 Seated LAQ 2 x 10 (4 # on right LE , 7 lbs on left LE)  Marching with same weights 2 x 10 Standing heel raises x 20 vc's for increased heel lift Standing hip abduction 2 x 10 Standing hip extension 2 x 10 Lunges fwd x 10 each LE  Lunges lateral x 10 each LE Nustep x 10 min  03/23/24 (10 min late) Nustep x 10 min  Re-assessment completed  Squats to chair with balance pad 2 x 10 Seated clam with yellow tband tied: 2 x 10 Side lying single leg clam with yellow tband tied 2 x 10   03/18/24 (17 min late for appt) Gait training: 271 feet with SPC no rest breaks and no need for wall assistance with no right foot dragging NuStep Level 4 x 8 mins- PT  present to discuss status In // bars: hip extension 2 x 10 In // bars: hip abduction 2 x 10  03/16/24  NuStep Level 4 x 12 mins- PT present to discuss status Gait training: 115 feet with SPC no rest breaks and no need for wall assistance Leg Press bilateral 70 # 2 x 10, singles 2 x 10 with 50 # Seated LAQ 3# x 10 on right and 8 lbs on left  Seated march with 3# on right and 8# on left x 20 (10 each side) Seated clamshell x 20 with red loop  Sit to stand x 5  PATIENT EDUCATION:  Education details: [email or web address] Person educated: Patient Education method: Explanation, Demonstration, Tactile cues, Verbal cues, and Handouts Education comprehension: verbalized understanding, returned demonstration, verbal cues required, tactile cues required, and needs further education  HOME EXERCISE PROGRAM: Access Code: [email or web address] URL: [email or web address] Date: 02/03/2024 Prepared by: Raynelle Fanning  Exercises - Supine March  - 1 x daily - 7 x weekly -  2 sets - 10 reps - Bent Knee Fallouts  - 1 x daily - 7 x weekly - 2 sets - 10 reps - Supine Hip Adductor Squeeze with Small Ball  - 1 x daily - 7 x weekly - 2 sets - 10 reps - Sit to Stand  - 3 x daily - 7 x weekly - 1-2 sets - 5-10 reps - Hooklying Clamshell with Resistance  - 1 x daily - 3 x weekly - 2 sets - 10 reps  ASSESSMENT:  CLINICAL IMPRESSION: Huseby was on time today.  We were able to do a longer walk and gait training as well as more standing activity today.  He had no episodes of instability or imbalance.  He did need a few rest breaks but completed all tasks.  His gait continues to improved.   He would benefit from continued skilled PT for LE strengthening and fall prevention.   Eval: Patient is a 43 y.o. male  who was seen today for physical therapy evaluation and treatment for Rt leg weakness. Pt has complex history of Burkitt lymphoma with chemo treatments now completed. Pt reports he was exhausted during his treatments and feels like  he is very weak and fatigued now. Does have numbness in bil thighs only posterior and anteriorly and feels like his Rt leg "gives out" to the point of him having 5 falls in the last several months. Pt does demonstrate gait deficits, very slow cadence and heavy trunk lean onto [facility] which pt uses on RT hand reporting he has more able to catch himself with the cane him this hand if his knee "gives". Pt found to have decreased strength bil LE but worse in Rt leg. Pt demonstrated high fall risk based on 5xSTS and after completing this was very fatigued and needed several minutes to recover stating 7/10 on BORG scale. LEFS score 22 / 80. 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   OBJECTIVE IMPAIRMENTS: Abnormal gait, decreased activity tolerance, decreased balance, decreased coordination, decreased endurance, decreased knowledge of use of DME, decreased mobility, difficulty walking, decreased strength, increased fascial restrictions, impaired perceived functional ability, increased muscle spasms, impaired flexibility, impaired sensation, improper body mechanics, postural dysfunction, and pain.   ACTIVITY LIMITATIONS: carrying, lifting, bending, sitting, standing, squatting, stairs, transfers, bed mobility, and locomotion level  PARTICIPATION LIMITATIONS: cleaning, laundry, interpersonal relationship, driving, shopping, community activity, occupation, and yard work  PERSONAL FACTORS: Fitness, Time since onset of injury/illness/exacerbation, and 1 comorbidity: medical history   are also affecting patient's functional outcome.   REHAB POTENTIAL: Good  CLINICAL DECISION MAKING: Evolving/moderate complexity  EVALUATION COMPLEXITY: Moderate   GOALS: Goals reviewed with patient? Yes  SHORT TERM GOALS: Target date: 02/25/24 Pt to be I with HEP.  Baseline: Goal status: In progress (compliance is poor)  2.  Pt to report  improved LEFS score of 30 to improve toward lowering functional impairment limitations.  Baseline:  Goal status: In progress  3.  Pt to demonstrate ability to complete body weight squats without hands for 10 reps to improve LE strength for tolerance for walking.  Baseline:  Goal status: MET 03/23/24  4.  Pt to demonstrate improved gait mechanics with even step length and height to be able to decreased fall risk with community ambulation.   Baseline:  Goal status: MET 03/23/24   LONG TERM GOALS: Target date: 05/18/24  Pt to be  I with advanced HEP.  Baseline:  Goal status: In Progress  2.  Pt to demonstrate 10# squats without compensatory strategies for improved hip strength for tolerance to return to work Baseline:  Goal status: In Progress  3.  Pt to report ability to walk with or without AD for at least 30 mins with no increase in pain for tolerance to return to work.  Baseline:  Goal status: In Progress  4.  Pt to demonstrate 5xSTS in 7s to achieve age related norm for decreased fall risk Baseline:  Goal status: In Progress  5.  Pt to demonstrate at least 4+/5 bil hip strength for improved pelvic stability and functional squats without leakage.  Baseline:  Goal status: In Progress   PLAN:  PT FREQUENCY: 2x/week  PT DURATION: 8 weeks  PLANNED INTERVENTIONS: 97110-Therapeutic exercises, 97530- Therapeutic activity, [id number]- Neuromuscular re-education, 97535- Self Care, [id number]- Manual therapy, [phone]- Gait training, [phone]- Aquatic Therapy, [phone]- Electrical stimulation (manual), [id number]- Vasopneumatic device, [id number]- Ultrasound, [id number]- Ionotophoresis 4mg /ml Dexamethasone, Patient/Family education, Balance training, Stair training, Taping, Dry Needling, Joint mobilization, Spinal mobilization, Scar mobilization, DME instructions, Cryotherapy, Moist heat, and Biofeedback  PLAN FOR NEXT SESSION:   Increase walking distance, progress to more standing activities and add band walks, continue  LE strengthening, standing tolerance ;standing balance, core strengthening   Tyshawna Alarid B. Odyssey Vasbinder, PT 03/25/24 5:35 PM [facility] [address], [address] [place], [place] [address] Phone # [phone] Fax [phone]

## 2024-03-30 ENCOUNTER — Other Ambulatory Visit: Payer: Self-pay

## 2024-03-31 ENCOUNTER — Encounter: Payer: Self-pay | Admitting: Hematology

## 2024-04-02 ENCOUNTER — Other Ambulatory Visit: Payer: Self-pay

## 2024-04-02 ENCOUNTER — Other Ambulatory Visit: Payer: Self-pay | Admitting: Pharmacy Technician

## 2024-04-02 DIAGNOSIS — C8378 Burkitt lymphoma, lymph nodes of multiple sites: Secondary | ICD-10-CM

## 2024-04-02 NOTE — Progress Notes (Signed)
 Specialty Pharmacy Refill Coordination Note  Logan Ward is a 43 y.o. male contacted today regarding refills of specialty medication(s) Bictegravir-Emtricitab-Tenofov (Biktarvy )   Patient requested Delivery   Delivery date: 04/06/24   Verified address: [address]  [place]   Medication will be filled on 04/05/24.

## 2024-04-05 ENCOUNTER — Encounter: Payer: Self-pay | Admitting: Physical Therapy

## 2024-04-05 ENCOUNTER — Inpatient Hospital Stay: Payer: 59

## 2024-04-05 ENCOUNTER — Other Ambulatory Visit: Payer: Self-pay

## 2024-04-05 ENCOUNTER — Ambulatory Visit: Admitting: Physical Therapy

## 2024-04-05 ENCOUNTER — Inpatient Hospital Stay: Payer: 59 | Attending: Hematology | Admitting: Hematology

## 2024-04-05 DIAGNOSIS — R296 Repeated falls: Secondary | ICD-10-CM

## 2024-04-05 DIAGNOSIS — M6281 Muscle weakness (generalized): Secondary | ICD-10-CM

## 2024-04-05 DIAGNOSIS — R262 Difficulty in walking, not elsewhere classified: Secondary | ICD-10-CM

## 2024-04-05 DIAGNOSIS — R29898 Other symptoms and signs involving the musculoskeletal system: Secondary | ICD-10-CM

## 2024-04-05 DIAGNOSIS — R252 Cramp and spasm: Secondary | ICD-10-CM

## 2024-04-05 NOTE — Therapy (Signed)
 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)    HIV infection (HCC)    Internal hemorrhoids    Obesity    Rectal ulcer    Past Surgical History:  Procedure Laterality Date   IR IMAGING GUIDED PORT INSERTION  11/18/2022   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   Neutropenia (HCC) 06/26/2023   Rash 06/26/2023   Intractable back pain suspect Granix-related 02/21/2023   Lactic acidosis 02/21/2023   Thrombocytopenia (HCC) 02/01/2023   Hyponatremia 01/31/2023   Hypokalemia 01/23/2023   Hematuria 01/11/2023   Neutropenic fever (HCC) 01/10/2023   Pancytopenia (HCC) 01/10/2023   Acute cystitis 01/10/2023   Anemia 01/01/2023   Healthcare maintenance 12/19/2022   Encounter for chemotherapy management 12/12/2022   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HCC) 12/10/2022   Decreased appetite 12/02/2022   Drug-induced constipation 11/21/2022   Nausea without vomiting 11/21/2022   Acute nonintractable headache 11/21/2022   Encounter for antineoplastic chemotherapy 11/18/2022   Burkitt lymphoma of lymph nodes of multiple regions [facility]) 11/14/2022   Counseling regarding advance care planning and goals of care 11/14/2022   Chest mass 11/10/2022   Exposure to chlamydia 10/24/2022   Submental lymphadenopathy 09/05/2022   Goals of care,  counseling/discussion 07/26/2022   History of syphilis 07/26/2022   Neck swelling 07/26/2022   AIDS (acquired immune deficiency syndrome) (HCC) 07/25/2022    PCP: Frankie Israel, MD  REFERRING PROVIDER: Frankie Israel, MD  REFERRING DIAG: C83.78 (ICD-10-CM) - Burkitt lymphoma of lymph nodes of multiple regions [facility]  THERAPY DIAG:  Muscle weakness (generalized)  Abnormality of gait and mobility  Rationale for Evaluation and Treatment: Rehabilitation  ONSET DATE: 6-7 months  SUBJECTIVE:  SUBJECTIVE STATEMENT: Patient reports he is doing okay today.  Eval: Rt leg weakness and gives out sometimes, cramps throughout the night and sometimes while walking. Uses SPC and this helps a lot with prevention of falling when it gives out.  No pattern with walking or standing time with weakness.  4-5 falls. Does have numbness and tingling in both legs throughout bil thighs Did finish cancer treatments last year, does still port. Chemo completed. Wasn't very active during chemo with exhausted and feels so weak now.    PAIN: 04/05/2024 Are you having pain? Yes NPRS scale: 6/10 Pain location: bil thighs  Pain type: aching and dull does report sharp with cramping at night Pain description: constant   Aggravating factors: cramping at night is highest pain; walking, standing Relieving factors: sitting to rest  PRECAUTIONS: Other: recent cancer   RED FLAGS: None   WEIGHT BEARING RESTRICTIONS: No  FALLS:  Has patient fallen in last 6 months? Yes. Number of falls 5 (most recent Sunday)  03/23/24: No falls in the past 4 weeks  LIVING ENVIRONMENT: Lives with: lives with their family Lives in: House/apartment Stairs: No Has following equipment at home: Single point cane, FWW  OCCUPATION: off  work right now, CNA  PLOF: Independent  PATIENT GOALS: to feel stronger and get back to how I was before cancer  PERTINENT HISTORY:  HIV/AIDs; syphilis, rapidly growing right cervical nodal mass extending into the level 2 nodal station of the right supraclavicular nodal station and extension into the superficial right sternocleidomastoid muscle.  There is also hypermetabolic right axillary nodal disease.diagnosed in November/December 2023 Sexual abuse: NO  NEXT MD VISIT: 04/05/24 - oncologist  OBJECTIVE:  Note: Objective measures were completed at Evaluation unless otherwise noted.  DIAGNOSTIC FINDINGS:   Xray - hip 01/27/24 Mild degenerative changes to the hip joint  CT NECK WITH CONTRAST 12/28/23  IMPRESSION: 1. Resolution of the large indistinctly marginated mass lesion throughout the anterior lower neck, previously more extensive on the right than the left. Mild scarring of the right sternocleidomastoid muscle but no evidence of recurrent mass lesion. 2. Stable appearance of level 1 lymph nodes, the largest showing short axis dimension of 1 cm. Previously seen mass/adenopathy in the right level 2 region has resolved without residual. Other cervical chain lymph nodes on both sides of the neck are within normal limits in size.   PATIENT SURVEYS:  Initial eval: LEFS Lower Extremity Functional Score: 22 / 80 = 27.5 %  indicative of moderate functional limitation   03/23/24: LEFS Lower Extremity Functional Score: 12 / 80 = 15 % indicative of severe functional limitation  (Patient explains he paid more attention to the details of the questions)  COGNITION: Overall cognitive status: Within functional limits for tasks assessed     SENSATION: Numbness in both thighs worse with pain, can feel pressure but light touch   POSTURE: rounded shoulders and anterior pelvic tilt  PALPATION: TTP at Rt greater trochanter, tightness noted in Rt hip flexor, Rt lumbar paraspinal  LOWER  EXTREMITY ROM:  WFL without pain for full flexion however at end range extension pt reports pain at proximal lateral hip on Rt  LOWER EXTREMITY MMT:  MMT Right Eval */5 Right  03/23/24 Left eval Left 03/23/24  Hip flexion 3 3 4+ 4+  Hip extension 3+ 3+ 4+ 4+  Hip abduction 3+ 4+ 4 5  Hip adduction 3+ 4+ 4+ 4+  Hip internal rotation      Hip external rotation  Knee flexion 3+ 4 4 4+  Knee extension 3 3+ 4 4+  Ankle dorsiflexion 3+ 4- 4+ 4+  Ankle plantarflexion      Ankle inversion      Ankle eversion       (Blank rows = not tested)   FUNCTIONAL TESTS:  Initial eval: 5 times sit to stand: 24.06s with use of hands and felt 7/10 on BORG with this  03/23/24: 5 times sit to stand: 17.85 s with use of hands and felt 3/10 on BORG with this  GAIT: Distance walked: 271' (completed last session) Assistive device utilized: Single point cane Level of assistance: Modified independence Comments: greatly decreased cadence, decreased step length and height at Rt leg                                                                                                                                TREATMENT DATE:  04/05/24 Nustep x 10 min Hurdles (forwards) step over pattern x 4 laps 6 inch step taps holding 7# BD in // bar 2 x 20 Sit to stand from mat table x 10 holding 7# DB Lunges fwd x 10 each LE  Lunges lateral x 10 each LE Lateral band walks with green TB x 2 laps   03/25/24 (on time) Gait training x 305 ft with minimal verbal cues for increased step length, heel strike and upright posture Sit to stand from mat table x 10 Seated LAQ 2 x 10 (4 # on right LE , 7 lbs on left LE)  Marching with same weights 2 x 10 Standing heel raises x 20 vc's for increased heel lift Standing hip abduction 2 x 10 Standing hip extension 2 x 10 Lunges fwd x 10 each LE  Lunges lateral x 10 each LE Nustep x 10 min  03/23/24 (10 min late) Nustep x 10 min  Re-assessment completed  Squats to chair  with balance pad 2 x 10 Seated clam with yellow tband tied: 2 x 10 Side lying single leg clam with yellow tband tied 2 x 10       PATIENT EDUCATION:  Education details: [id number] Person educated: Patient Education method: Explanation, Demonstration, Tactile cues, Verbal cues, and Handouts Education comprehension: verbalized understanding, returned demonstration, verbal cues required, tactile cues required, and needs further education  HOME EXERCISE PROGRAM: Access Code: [email or web address] URL: [email or web address] Date: 02/03/2024 Prepared by: Concha Deed  Exercises - Supine March  - 1 x daily - 7 x weekly - 2 sets - 10 reps - Bent Knee Fallouts  - 1 x daily - 7 x weekly - 2 sets - 10 reps - Supine Hip Adductor Squeeze with Small Ball  - 1 x daily - 7 x weekly - 2 sets - 10 reps - Sit to Stand  - 3 x daily - 7 x weekly - 1-2 sets - 5-10 reps - Hooklying Clamshell with Resistance  - 1 x daily - 3 x weekly -  2 sets - 10 reps  ASSESSMENT:  CLINICAL IMPRESSION: Safwan presented to therapy with 6/10 pain today. Since starting therapy he verbalized feeling stronger. Incorporated more single leg standing activities today and patient verbalized some muscle fatigue. He required occasional seated rest breaks due to fatigue. Patient required verbal and visual cues for correct exercise performance. Overall, he tolerated treatment session well. Patient will benefit from skilled PT to address the below impairments and improve overall function.   Eval: Patient is a 43 y.o. male  who was seen today for physical therapy evaluation and treatment for Rt leg weakness. Pt has complex history of Burkitt lymphoma with chemo treatments now completed. Pt reports he was exhausted during his treatments and feels like he is very weak and fatigued now. Does have numbness in bil thighs only posterior and anteriorly and feels like his Rt leg "gives out" to the point of him having 5 falls in the last several months.  Pt does demonstrate gait deficits, very slow cadence and heavy trunk lean onto [facility] which pt uses on RT hand reporting he has more able to catch himself with the cane him this hand if his knee "gives". Pt found to have decreased strength bil LE but worse in Rt leg. Pt demonstrated high fall risk based on 5xSTS and after completing this was very fatigued and needed several minutes to recover stating 7/10 on BORG scale. LEFS score 22 / 80. 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   OBJECTIVE IMPAIRMENTS: Abnormal gait, decreased activity tolerance, decreased balance, decreased coordination, decreased endurance, decreased knowledge of use of DME, decreased mobility, difficulty walking, decreased strength, increased fascial restrictions, impaired perceived functional ability, increased muscle spasms, impaired flexibility, impaired sensation, improper body mechanics, postural dysfunction, and pain.   ACTIVITY LIMITATIONS: carrying, lifting, bending, sitting, standing, squatting, stairs, transfers, bed mobility, and locomotion level  PARTICIPATION LIMITATIONS: cleaning, laundry, interpersonal relationship, driving, shopping, community activity, occupation, and yard work  PERSONAL FACTORS: Fitness, Time since onset of injury/illness/exacerbation, and 1 comorbidity: medical history   are also affecting patient's functional outcome.   REHAB POTENTIAL: Good  CLINICAL DECISION MAKING: Evolving/moderate complexity  EVALUATION COMPLEXITY: Moderate   GOALS: Goals reviewed with patient? Yes  SHORT TERM GOALS: Target date: 02/25/24 Pt to be I with HEP.  Baseline: Goal status: In progress (compliance is poor)  2.  Pt to report improved LEFS score of 30 to improve toward lowering functional impairment limitations.  Baseline:  Goal status: In progress  3.  Pt to demonstrate ability to complete body weight squats without  hands for 10 reps to improve LE strength for tolerance for walking.  Baseline:  Goal status: MET 03/23/24  4.  Pt to demonstrate improved gait mechanics with even step length and height to be able to decreased fall risk with community ambulation.   Baseline:  Goal status: MET 03/23/24   LONG TERM GOALS: Target date: 05/18/24  Pt to be I with advanced HEP.  Baseline:  Goal status: In Progress  2.  Pt to demonstrate 10# squats without compensatory strategies for improved hip strength for tolerance to return to work Baseline:  Goal status: In Progress  3.  Pt to report ability to walk with or without AD for at least 30 mins with no increase in pain for tolerance to return to work.  Baseline:  Goal status: In Progress  4.  Pt to  demonstrate 5xSTS in 7s to achieve age related norm for decreased fall risk Baseline:  Goal status: In Progress  5.  Pt to demonstrate at least 4+/5 bil hip strength for improved pelvic stability and functional squats without leakage.  Baseline:  Goal status: In Progress   PLAN:  PT FREQUENCY: 2x/week  PT DURATION: 8 weeks  PLANNED INTERVENTIONS: 97110-Therapeutic exercises, 97530- Therapeutic activity, 97112- Neuromuscular re-education, [phone]- Self Care, 40981- Manual therapy, [phone]- Gait training, [phone]- Aquatic Therapy, [phone]- Electrical stimulation (manual), [id number]- Vasopneumatic device, [id number]- Ultrasound, [id number]- Ionotophoresis 4mg /ml Dexamethasone , Patient/Family education, Balance training, Stair training, Taping, Dry Needling, Joint mobilization, Spinal mobilization, Scar mobilization, DME instructions, Cryotherapy, Moist heat, and Biofeedback  PLAN FOR NEXT SESSION:   Increase walking distance, progress to more standing activities , continue LE strengthening ;standing balance, core strengthening   Penelope Bowie, PT 04/05/24 12:24 PM [facility] [address] [place], [place] [address] Phone # [phone] Fax  [phone]

## 2024-04-05 NOTE — Progress Notes (Signed)
 This encounter was created in error - please disregard.  Patient no showed despite confirming his appointment

## 2024-04-06 ENCOUNTER — Encounter: Payer: Self-pay | Admitting: Hematology

## 2024-04-08 ENCOUNTER — Encounter: Payer: Self-pay | Admitting: Physical Therapy

## 2024-04-08 ENCOUNTER — Ambulatory Visit: Admitting: Physical Therapy

## 2024-04-08 DIAGNOSIS — R262 Difficulty in walking, not elsewhere classified: Secondary | ICD-10-CM

## 2024-04-08 DIAGNOSIS — R29898 Other symptoms and signs involving the musculoskeletal system: Secondary | ICD-10-CM

## 2024-04-08 DIAGNOSIS — M6281 Muscle weakness (generalized): Secondary | ICD-10-CM | POA: Diagnosis not present

## 2024-04-08 DIAGNOSIS — R296 Repeated falls: Secondary | ICD-10-CM

## 2024-04-08 DIAGNOSIS — R252 Cramp and spasm: Secondary | ICD-10-CM

## 2024-04-08 NOTE — Therapy (Signed)
 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)    HIV infection (HCC)    Internal hemorrhoids    Obesity    Rectal ulcer    Past Surgical History:  Procedure Laterality Date   IR IMAGING GUIDED PORT INSERTION  11/18/2022   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   Neutropenia (HCC) 06/26/2023   Rash 06/26/2023   Intractable back pain suspect Granix-related 02/21/2023   Lactic acidosis 02/21/2023   Thrombocytopenia (HCC) 02/01/2023   Hyponatremia 01/31/2023   Hypokalemia 01/23/2023   Hematuria 01/11/2023   Neutropenic fever (HCC) 01/10/2023   Pancytopenia (HCC) 01/10/2023   Acute cystitis 01/10/2023   Anemia 01/01/2023   Healthcare maintenance 12/19/2022   Encounter for chemotherapy management 12/12/2022   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HCC) 12/10/2022   Decreased appetite 12/02/2022   Drug-induced constipation 11/21/2022   Nausea without vomiting 11/21/2022   Acute nonintractable headache 11/21/2022   Encounter for antineoplastic chemotherapy 11/18/2022   Burkitt lymphoma of lymph nodes of multiple regions [facility]) 11/14/2022   Counseling regarding advance care planning and goals of care 11/14/2022   Chest mass 11/10/2022   Exposure to chlamydia 10/24/2022   Submental lymphadenopathy 09/05/2022   Goals of care,  counseling/discussion 07/26/2022   History of syphilis 07/26/2022   Neck swelling 07/26/2022   AIDS (acquired immune deficiency syndrome) (HCC) 07/25/2022    PCP: Frankie Israel, MD  REFERRING PROVIDER: Frankie Israel, MD  REFERRING DIAG: C83.78 (ICD-10-CM) - Burkitt lymphoma of lymph nodes of multiple regions [facility])  THERAPY DIAG:  Muscle weakness (generalized)  Abnormality of gait and mobility  Rationale for Evaluation and Treatment: Rehabilitation  ONSET DATE: 6-7 months  SUBJECTIVE:  SUBJECTIVE STATEMENT: Patient reports he is doing good today. No new complaints.  Eval: Rt leg weakness and gives out sometimes, cramps throughout the night and sometimes while walking. Uses SPC and this helps a lot with prevention of falling when it gives out.  No pattern with walking or standing time with weakness.  4-5 falls. Does have numbness and tingling in both legs throughout bil thighs Did finish cancer treatments last year, does still port. Chemo completed. Wasn't very active during chemo with exhausted and feels so weak now.    PAIN: 04/08/2024 Are you having pain? Yes NPRS scale: 6/10 Pain location: bil thighs  Pain type: aching and dull does report sharp with cramping at night Pain description: constant   Aggravating factors: cramping at night is highest pain; walking, standing Relieving factors: sitting to rest  PRECAUTIONS: Other: recent cancer   RED FLAGS: None   WEIGHT BEARING RESTRICTIONS: No  FALLS:  Has patient fallen in last 6 months? Yes. Number of falls 5 (most recent Sunday)  03/23/24: No falls in the past 4 weeks  LIVING ENVIRONMENT: Lives with: lives with their family Lives in: House/apartment Stairs: No Has following equipment at home: Single point cane,  FWW  OCCUPATION: off work right now, CNA  PLOF: Independent  PATIENT GOALS: to feel stronger and get back to how I was before cancer  PERTINENT HISTORY:  HIV/AIDs; syphilis, rapidly growing right cervical nodal mass extending into the level 2 nodal station of the right supraclavicular nodal station and extension into the superficial right sternocleidomastoid muscle.  There is also hypermetabolic right axillary nodal disease.diagnosed in November/December 2023 Sexual abuse: NO  NEXT MD VISIT: 04/05/24 - oncologist  OBJECTIVE:  Note: Objective measures were completed at Evaluation unless otherwise noted.  DIAGNOSTIC FINDINGS:   Xray - hip 01/27/24 Mild degenerative changes to the hip joint  CT NECK WITH CONTRAST 12/28/23  IMPRESSION: 1. Resolution of the large indistinctly marginated mass lesion throughout the anterior lower neck, previously more extensive on the right than the left. Mild scarring of the right sternocleidomastoid muscle but no evidence of recurrent mass lesion. 2. Stable appearance of level 1 lymph nodes, the largest showing short axis dimension of 1 cm. Previously seen mass/adenopathy in the right level 2 region has resolved without residual. Other cervical chain lymph nodes on both sides of the neck are within normal limits in size.   PATIENT SURVEYS:  Initial eval: LEFS Lower Extremity Functional Score: 22 / 80 = 27.5 %  indicative of moderate functional limitation   03/23/24: LEFS Lower Extremity Functional Score: 12 / 80 = 15 % indicative of severe functional limitation  (Patient explains he paid more attention to the details of the questions)  COGNITION: Overall cognitive status: Within functional limits for tasks assessed     SENSATION: Numbness in both thighs worse with pain, can feel pressure but light touch   POSTURE: rounded shoulders and anterior pelvic tilt  PALPATION: TTP at Rt greater trochanter, tightness noted in Rt hip flexor, Rt  lumbar paraspinal  LOWER EXTREMITY ROM:  WFL without pain for full flexion however at end range extension pt reports pain at proximal lateral hip on Rt  LOWER EXTREMITY MMT:  MMT Right Eval */5 Right  03/23/24 Left eval Left 03/23/24  Hip flexion 3 3 4+ 4+  Hip extension 3+ 3+ 4+ 4+  Hip abduction 3+ 4+ 4 5  Hip adduction 3+ 4+ 4+ 4+  Hip internal rotation      Hip external  rotation      Knee flexion 3+ 4 4 4+  Knee extension 3 3+ 4 4+  Ankle dorsiflexion 3+ 4- 4+ 4+  Ankle plantarflexion      Ankle inversion      Ankle eversion       (Blank rows = not tested)   FUNCTIONAL TESTS:  Initial eval: 5 times sit to stand: 24.06s with use of hands and felt 7/10 on BORG with this  03/23/24: 5 times sit to stand: 17.85 s with use of hands and felt 3/10 on BORG with this  GAIT: Distance walked: 271' (completed last session) Assistive device utilized: Single point cane Level of assistance: Modified independence Comments: greatly decreased cadence, decreased step length and height at Rt leg                                                                                                                                TREATMENT DATE:  04/08/24 Gait training down long hallway x 2 laps Seated LAQ 2 x 10 (4 # on right LE , 7 lbs on left LE)  Weight shifts on airex (forwards, sideways) x 1 min each 6inch step ups in // barre x 10 bilateral  Side stepping in // bar with yellow TB x 2 laps Squatting in // bar x 10 NuStep level 4 x 10 mins - PT present to discuss status    04/05/24 Nustep x 10 min Hurdles (forwards) step over pattern x 4 laps 6 inch step taps holding 7# BD in // bar 2 x 20 Sit to stand from mat table x 10 holding 7# DB Lunges fwd x 10 each LE  Lunges lateral x 10 each LE Lateral band walks with green TB x 2 laps   03/25/24 (on time) Gait training x 305 ft with minimal verbal cues for increased step length, heel strike and upright posture Sit to stand from mat  table x 10 Seated LAQ 2 x 10 (4 # on right LE , 7 lbs on left LE)  Marching with same weights 2 x 10 Standing heel raises x 20 vc's for increased heel lift Standing hip abduction 2 x 10 Standing hip extension 2 x 10 Lunges fwd x 10 each LE  Lunges lateral x 10 each LE Nustep x 10 min    PATIENT EDUCATION:  Education details: [email or web address] Person educated: Patient Education method: Explanation, Demonstration, Tactile cues, Verbal cues, and Handouts Education comprehension: verbalized understanding, returned demonstration, verbal cues required, tactile cues required, and needs further education  HOME EXERCISE PROGRAM: Access Code: [email or web address] URL: [email or web address] Date: 02/03/2024 Prepared by: Concha Deed  Exercises - Supine March  - 1 x daily - 7 x weekly - 2 sets - 10 reps - Bent Knee Fallouts  - 1 x daily - 7 x weekly - 2 sets - 10 reps - Supine Hip Adductor Squeeze with Small Ball  - 1 x daily - 7 x weekly -  2 sets - 10 reps - Sit to Stand  - 3 x daily - 7 x weekly - 1-2 sets - 5-10 reps - Hooklying Clamshell with Resistance  - 1 x daily - 3 x weekly - 2 sets - 10 reps  ASSESSMENT:  CLINICAL IMPRESSION: Greer continue to verbalized high pain levels. Incorporated more standing strengthening exercises and patient verbalized some fatigue. Airex balance was a good challenge for patient he required occasional UE support to maintain balance. Overall, patient tolerated treatment session well. Required verbal and visual cues for correct exercise performance. Patient will benefit from skilled PT to address the below impairments and improve overall function.    Eval: Patient is a 43 y.o. male  who was seen today for physical therapy evaluation and treatment for Rt leg weakness. Pt has complex history of Burkitt lymphoma with chemo treatments now completed. Pt reports he was exhausted during his treatments and feels like he is very weak and fatigued now. Does have numbness in bil  thighs only posterior and anteriorly and feels like his Rt leg "gives out" to the point of him having 5 falls in the last several months. Pt does demonstrate gait deficits, very slow cadence and heavy trunk lean onto [facility] which pt uses on RT hand reporting he has more able to catch himself with the cane him this hand if his knee "gives". Pt found to have decreased strength bil LE but worse in Rt leg. Pt demonstrated high fall risk based on 5xSTS and after completing this was very fatigued and needed several minutes to recover stating 7/10 on BORG scale. LEFS score 22 / 80. 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   OBJECTIVE IMPAIRMENTS: Abnormal gait, decreased activity tolerance, decreased balance, decreased coordination, decreased endurance, decreased knowledge of use of DME, decreased mobility, difficulty walking, decreased strength, increased fascial restrictions, impaired perceived functional ability, increased muscle spasms, impaired flexibility, impaired sensation, improper body mechanics, postural dysfunction, and pain.   ACTIVITY LIMITATIONS: carrying, lifting, bending, sitting, standing, squatting, stairs, transfers, bed mobility, and locomotion level  PARTICIPATION LIMITATIONS: cleaning, laundry, interpersonal relationship, driving, shopping, community activity, occupation, and yard work  PERSONAL FACTORS: Fitness, Time since onset of injury/illness/exacerbation, and 1 comorbidity: medical history   are also affecting patient's functional outcome.   REHAB POTENTIAL: Good  CLINICAL DECISION MAKING: Evolving/moderate complexity  EVALUATION COMPLEXITY: Moderate   GOALS: Goals reviewed with patient? Yes  SHORT TERM GOALS: Target date: 02/25/24 Pt to be I with HEP.  Baseline: Goal status: In progress (compliance is poor)  2.  Pt to report improved LEFS score of 30 to improve toward lowering  functional impairment limitations.  Baseline:  Goal status: In progress  3.  Pt to demonstrate ability to complete body weight squats without hands for 10 reps to improve LE strength for tolerance for walking.  Baseline:  Goal status: MET 03/23/24  4.  Pt to demonstrate improved gait mechanics with even step length and height to be able to decreased fall risk with community ambulation.   Baseline:  Goal status: MET 03/23/24   LONG TERM GOALS: Target date: 05/18/24  Pt to be I with advanced HEP.  Baseline:  Goal status: In Progress  2.  Pt to demonstrate 10# squats without compensatory strategies for improved hip strength for tolerance to return to work Baseline:  Goal status: In Progress  3.  Pt to report  ability to walk with or without AD for at least 30 mins with no increase in pain for tolerance to return to work.  Baseline:  Goal status: In Progress  4.  Pt to demonstrate 5xSTS in 7s to achieve age related norm for decreased fall risk Baseline:  Goal status: In Progress  5.  Pt to demonstrate at least 4+/5 bil hip strength for improved pelvic stability and functional squats without leakage.  Baseline:  Goal status: In Progress   PLAN:  PT FREQUENCY: 2x/week  PT DURATION: 8 weeks  PLANNED INTERVENTIONS: 97110-Therapeutic exercises, 97530- Therapeutic activity, [id number]- Neuromuscular re-education, 97535- Self Care, [id number]- Manual therapy, [phone]- Gait training, [phone]- Aquatic Therapy, [phone]- Electrical stimulation (manual), [id number]- Vasopneumatic device, [id number]- Ultrasound, [id number]- Ionotophoresis 4mg /ml Dexamethasone , Patient/Family education, Balance training, Stair training, Taping, Dry Needling, Joint mobilization, Spinal mobilization, Scar mobilization, DME instructions, Cryotherapy, Moist heat, and Biofeedback  PLAN FOR NEXT SESSION:   leg press; Increase walking distance; continue LE strengthening ;standing balance, core strengthening   Penelope Bowie, PT 04/08/24 1:07  PM [facility] [address] [place], [place] [address] Phone # [phone] Fax [phone]

## 2024-04-13 ENCOUNTER — Ambulatory Visit

## 2024-04-15 ENCOUNTER — Ambulatory Visit: Attending: Hematology

## 2024-04-15 DIAGNOSIS — R29898 Other symptoms and signs involving the musculoskeletal system: Secondary | ICD-10-CM | POA: Insufficient documentation

## 2024-04-15 DIAGNOSIS — M6281 Muscle weakness (generalized): Secondary | ICD-10-CM | POA: Diagnosis present

## 2024-04-15 DIAGNOSIS — R252 Cramp and spasm: Secondary | ICD-10-CM | POA: Insufficient documentation

## 2024-04-15 DIAGNOSIS — R296 Repeated falls: Secondary | ICD-10-CM | POA: Insufficient documentation

## 2024-04-15 DIAGNOSIS — R262 Difficulty in walking, not elsewhere classified: Secondary | ICD-10-CM | POA: Insufficient documentation

## 2024-04-15 DIAGNOSIS — C8378 Burkitt lymphoma, lymph nodes of multiple sites: Secondary | ICD-10-CM | POA: Insufficient documentation

## 2024-04-15 DIAGNOSIS — R293 Abnormal posture: Secondary | ICD-10-CM | POA: Diagnosis present

## 2024-04-15 NOTE — Therapy (Signed)
 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)    HIV infection (HCC)    Internal hemorrhoids    Obesity    Rectal ulcer    Past Surgical History:  Procedure Laterality Date   IR IMAGING GUIDED PORT INSERTION  11/18/2022   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   Neutropenia (HCC) 06/26/2023   Rash 06/26/2023   Intractable back pain suspect Granix-related 02/21/2023   Lactic acidosis 02/21/2023   Thrombocytopenia (HCC) 02/01/2023   Hyponatremia 01/31/2023   Hypokalemia 01/23/2023   Hematuria 01/11/2023   Neutropenic fever (HCC) 01/10/2023   Pancytopenia (HCC) 01/10/2023   Acute cystitis 01/10/2023   Anemia 01/01/2023   Healthcare maintenance 12/19/2022   Encounter for chemotherapy management 12/12/2022   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HCC) 12/10/2022   Decreased appetite 12/02/2022   Drug-induced constipation 11/21/2022   Nausea without vomiting 11/21/2022   Acute nonintractable headache 11/21/2022   Encounter for antineoplastic chemotherapy 11/18/2022   Burkitt lymphoma of lymph nodes of multiple regions [facility] 11/14/2022   Counseling regarding advance care planning and goals of care 11/14/2022   Chest mass 11/10/2022   Exposure to chlamydia 10/24/2022   Submental lymphadenopathy 09/05/2022   Goals of care, counseling/discussion 07/26/2022   History  of syphilis 07/26/2022   Neck swelling 07/26/2022   AIDS (acquired immune deficiency syndrome) (HCC) 07/25/2022    PCP: Frankie Israel, MD  REFERRING PROVIDER: Frankie Israel, MD  REFERRING DIAG: C83.78 (ICD-10-CM) - Burkitt lymphoma of lymph nodes of multiple regions [facility])  THERAPY DIAG:  Muscle weakness (generalized)  Abnormality of gait and mobility  Rationale for Evaluation and Treatment: Rehabilitation  ONSET DATE: 6-7 months  SUBJECTIVE:  SUBJECTIVE STATEMENT: Patient reports he "feels good".  I've been doing my sit to stands and I'm able to be up longer after I do anything where I exert myself.    Eval: Rt leg weakness and gives out sometimes, cramps throughout the night and sometimes while walking. Uses SPC and this helps a lot with prevention of falling when it gives out.  No pattern with walking or standing time with weakness.  4-5 falls. Does have numbness and tingling in both legs throughout bil thighs Did finish cancer treatments last year, does still port. Chemo completed. Wasn't very active during chemo with exhausted and feels so weak now.    PAIN: 04/15/2024 Are you having pain? Yes NPRS scale: 5/10 Pain location: bil thighs  Pain type: aching and dull does report sharp with cramping at night Pain description: constant   Aggravating factors: cramping at night is highest pain; walking, standing Relieving factors: sitting to rest  PRECAUTIONS: Other: recent cancer   RED FLAGS: None   WEIGHT BEARING RESTRICTIONS: No  FALLS:  Has patient fallen in last 6 months? Yes. Number of falls 5 (most recent Sunday)  03/23/24: No falls in the past 4 weeks  LIVING ENVIRONMENT: Lives with: lives with their family Lives in: House/apartment Stairs: No Has following  equipment at home: Single point cane, FWW  OCCUPATION: off work right now, CNA  PLOF: Independent  PATIENT GOALS: to feel stronger and get back to how I was before cancer  PERTINENT HISTORY:  HIV/AIDs; syphilis, rapidly growing right cervical nodal mass extending into the level 2 nodal station of the right supraclavicular nodal station and extension into the superficial right sternocleidomastoid muscle.  There is also hypermetabolic right axillary nodal disease.diagnosed in November/December 2023 Sexual abuse: NO  NEXT MD VISIT: 04/05/24 - oncologist  OBJECTIVE:  Note: Objective measures were completed at Evaluation unless otherwise noted.  DIAGNOSTIC FINDINGS:   Xray - hip 01/27/24 Mild degenerative changes to the hip joint  CT NECK WITH CONTRAST 12/28/23  IMPRESSION: 1. Resolution of the large indistinctly marginated mass lesion throughout the anterior lower neck, previously more extensive on the right than the left. Mild scarring of the right sternocleidomastoid muscle but no evidence of recurrent mass lesion. 2. Stable appearance of level 1 lymph nodes, the largest showing short axis dimension of 1 cm. Previously seen mass/adenopathy in the right level 2 region has resolved without residual. Other cervical chain lymph nodes on both sides of the neck are within normal limits in size.   PATIENT SURVEYS:  Initial eval: LEFS Lower Extremity Functional Score: 22 / 80 = 27.5 %  indicative of moderate functional limitation   03/23/24: LEFS Lower Extremity Functional Score: 12 / 80 = 15 % indicative of severe functional limitation  (Patient explains he paid more attention to the details of the questions)  COGNITION: Overall cognitive status: Within functional limits for tasks assessed     SENSATION: Numbness in both thighs worse with pain, can feel pressure but light touch   POSTURE: rounded shoulders and anterior pelvic tilt  PALPATION: TTP at Rt greater trochanter,  tightness noted in Rt hip flexor, Rt lumbar paraspinal  LOWER EXTREMITY ROM:  WFL without pain for full flexion however at end range extension pt reports pain at proximal lateral hip on Rt  LOWER EXTREMITY MMT:  MMT Right Eval */5 Right  03/23/24 Left eval Left 03/23/24  Hip flexion 3 3 4+ 4+  Hip extension 3+ 3+ 4+ 4+  Hip abduction 3+ 4+  4 5  Hip adduction 3+ 4+ 4+ 4+  Hip internal rotation      Hip external rotation      Knee flexion 3+ 4 4 4+  Knee extension 3 3+ 4 4+  Ankle dorsiflexion 3+ 4- 4+ 4+  Ankle plantarflexion      Ankle inversion      Ankle eversion       (Blank rows = not tested)   FUNCTIONAL TESTS:  Initial eval: 5 times sit to stand: 24.06s with use of hands and felt 7/10 on BORG with this  03/23/24: 5 times sit to stand: 17.85 s with use of hands and felt 3/10 on BORG with this  GAIT: Distance walked: 271' (completed last session) Assistive device utilized: Single point cane Level of assistance: Modified independence Comments: greatly decreased cadence, decreased step length and height at Rt leg                                                                                                                                TREATMENT DATE:  04/15/24 Treadmill x 8 min, beginning at slow speed and working up to 2.0 mph Gait training down long hallway x 2 laps Seated LAQ 2 x 10 (6 # on right LE , 7 lbs on left LE)  Seated marching x 20 Seated hip ER with 6lb and 7lbs 2 x 10 each Standing heel raises 2 x 10 Squatting x 10 Sit to stand  2 x 10  04/08/24 Gait training down long hallway x 2 laps Seated LAQ 2 x 10 (4 # on right LE , 7 lbs on left LE)  Weight shifts on airex (forwards, sideways) x 1 min each 6inch step ups in // barre x 10 bilateral  Side stepping in // bar with yellow TB x 2 laps Squatting in // bar x 10 NuStep level 4 x 10 mins - PT present to discuss status  04/05/24 Nustep x 10 min Hurdles (forwards) step over pattern x 4 laps 6  inch step taps holding 7# BD in // bar 2 x 20 Sit to stand from mat table x 10 holding 7# DB Lunges fwd x 10 each LE  Lunges lateral x 10 each LE Lateral band walks with green TB x 2 laps   03/25/24 (on time) Gait training x 305 ft with minimal verbal cues for increased step length, heel strike and upright posture Sit to stand from mat table x 10 Seated LAQ 2 x 10 (4 # on right LE , 7 lbs on left LE)  Marching with same weights 2 x 10 Standing heel raises x 20 vc's for increased heel lift Standing hip abduction 2 x 10 Standing hip extension 2 x 10 Lunges fwd x 10 each LE  Lunges lateral x 10 each LE Nustep x 10 min    PATIENT EDUCATION:  Education details: [email or web address] Person educated: Patient Education method: Explanation, Demonstration, Tactile cues, Verbal  cues, and Handouts Education comprehension: verbalized understanding, returned demonstration, verbal cues required, tactile cues required, and needs further education  HOME EXERCISE PROGRAM: Access Code: [email or web address] URL: [email or web address] Date: 02/03/2024 Prepared by: Concha Deed  Exercises - Supine March  - 1 x daily - 7 x weekly - 2 sets - 10 reps - Bent Knee Fallouts  - 1 x daily - 7 x weekly - 2 sets - 10 reps - Supine Hip Adductor Squeeze with Small Ball  - 1 x daily - 7 x weekly - 2 sets - 10 reps - Sit to Stand  - 3 x daily - 7 x weekly - 1-2 sets - 5-10 reps - Hooklying Clamshell with Resistance  - 1 x daily - 3 x weekly - 2 sets - 10 reps  ASSESSMENT:  CLINICAL IMPRESSION: Jahshua was able to tolerate 8 min of treadmill today with good stability, step length and no loss of balance.  He is walking approx 400 feet with SPC with no right foot dragging.  He is gaining strength in right quad.  He is now able to do LAQ with 6 lbs 2 x 10.  He is becoming more compliant and more motivated.  Patient will benefit from skilled PT to address the below impairments and improve overall function.  Eval: Patient is a 43  y.o. male  who was seen today for physical therapy evaluation and treatment for Rt leg weakness. Pt has complex history of Burkitt lymphoma with chemo treatments now completed. Pt reports he was exhausted during his treatments and feels like he is very weak and fatigued now. Does have numbness in bil thighs only posterior and anteriorly and feels like his Rt leg "gives out" to the point of him having 5 falls in the last several months. Pt does demonstrate gait deficits, very slow cadence and heavy trunk lean onto [facility] which pt uses on RT hand reporting he has more able to catch himself with the cane him this hand if his knee "gives". Pt found to have decreased strength bil LE but worse in Rt leg. Pt demonstrated high fall risk based on 5xSTS and after completing this was very fatigued and needed several minutes to recover stating 7/10 on BORG scale. LEFS score 22 / 80. 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   OBJECTIVE IMPAIRMENTS: Abnormal gait, decreased activity tolerance, decreased balance, decreased coordination, decreased endurance, decreased knowledge of use of DME, decreased mobility, difficulty walking, decreased strength, increased fascial restrictions, impaired perceived functional ability, increased muscle spasms, impaired flexibility, impaired sensation, improper body mechanics, postural dysfunction, and pain.   ACTIVITY LIMITATIONS: carrying, lifting, bending, sitting, standing, squatting, stairs, transfers, bed mobility, and locomotion level  PARTICIPATION LIMITATIONS: cleaning, laundry, interpersonal relationship, driving, shopping, community activity, occupation, and yard work  PERSONAL FACTORS: Fitness, Time since onset of injury/illness/exacerbation, and 1 comorbidity: medical history   are also affecting patient's functional outcome.   REHAB POTENTIAL: Good  CLINICAL DECISION MAKING:  Evolving/moderate complexity  EVALUATION COMPLEXITY: Moderate   GOALS: Goals reviewed with patient? Yes  SHORT TERM GOALS: Target date: 02/25/24 Pt to be I with HEP.  Baseline: Goal status: In progress (compliance is poor)  2.  Pt to report improved LEFS score of 30 to improve toward lowering functional impairment limitations.  Baseline:  Goal status: In progress  3.  Pt to demonstrate ability to complete body weight squats without hands for  10 reps to improve LE strength for tolerance for walking.  Baseline:  Goal status: MET 03/23/24  4.  Pt to demonstrate improved gait mechanics with even step length and height to be able to decreased fall risk with community ambulation.   Baseline:  Goal status: MET 03/23/24   LONG TERM GOALS: Target date: 05/18/24  Pt to be I with advanced HEP.  Baseline:  Goal status: In Progress  2.  Pt to demonstrate 10# squats without compensatory strategies for improved hip strength for tolerance to return to work Baseline:  Goal status: In Progress  3.  Pt to report ability to walk with or without AD for at least 30 mins with no increase in pain for tolerance to return to work.  Baseline:  Goal status: In Progress  4.  Pt to demonstrate 5xSTS in 7s to achieve age related norm for decreased fall risk Baseline:  Goal status: In Progress  5.  Pt to demonstrate at least 4+/5 bil hip strength for improved pelvic stability and functional squats without leakage.  Baseline:  Goal status: In Progress   PLAN:  PT FREQUENCY: 2x/week  PT DURATION: 8 weeks  PLANNED INTERVENTIONS: 97110-Therapeutic exercises, 97530- Therapeutic activity, [id number]- Neuromuscular re-education, 97535- Self Care, [id number]- Manual therapy, [phone]- Gait training, [phone]- Aquatic Therapy, [phone]- Electrical stimulation (manual), [id number]- Vasopneumatic device, [id number]- Ultrasound, [id number]- Ionotophoresis 4mg /ml Dexamethasone , Patient/Family education, Balance training, Stair training, Taping,  Dry Needling, Joint mobilization, Spinal mobilization, Scar mobilization, DME instructions, Cryotherapy, Moist heat, and Biofeedback  PLAN FOR NEXT SESSION:   leg press; Increase walking distance; continue LE strengthening ;standing balance, core strengthening   Verneta Hamidi B. Burlene Montecalvo, PT 04/15/24 9:44 PM [facility] [address] [place], [place] [address] Phone # [phone] Fax [phone]

## 2024-04-20 ENCOUNTER — Ambulatory Visit: Admitting: Physical Therapy

## 2024-04-20 ENCOUNTER — Encounter: Payer: Self-pay | Admitting: Physical Therapy

## 2024-04-20 DIAGNOSIS — M6281 Muscle weakness (generalized): Secondary | ICD-10-CM | POA: Diagnosis not present

## 2024-04-20 DIAGNOSIS — R252 Cramp and spasm: Secondary | ICD-10-CM

## 2024-04-20 DIAGNOSIS — R262 Difficulty in walking, not elsewhere classified: Secondary | ICD-10-CM

## 2024-04-20 DIAGNOSIS — R29898 Other symptoms and signs involving the musculoskeletal system: Secondary | ICD-10-CM

## 2024-04-20 DIAGNOSIS — R296 Repeated falls: Secondary | ICD-10-CM

## 2024-04-20 NOTE — Therapy (Signed)
 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)    HIV infection (HCC)    Internal hemorrhoids    Obesity    Rectal ulcer    Past Surgical History:  Procedure Laterality Date   IR IMAGING GUIDED PORT INSERTION  11/18/2022   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   Neutropenia (HCC) 06/26/2023   Rash 06/26/2023   Intractable back pain suspect Granix-related 02/21/2023   Lactic acidosis 02/21/2023   Thrombocytopenia (HCC) 02/01/2023   Hyponatremia 01/31/2023   Hypokalemia 01/23/2023   Hematuria 01/11/2023   Neutropenic fever (HCC) 01/10/2023   Pancytopenia (HCC) 01/10/2023   Acute cystitis 01/10/2023   Anemia 01/01/2023   Healthcare maintenance 12/19/2022   Encounter for chemotherapy management 12/12/2022   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HCC) 12/10/2022   Decreased appetite 12/02/2022   Drug-induced constipation 11/21/2022   Nausea without vomiting 11/21/2022   Acute nonintractable headache 11/21/2022   Encounter for antineoplastic chemotherapy 11/18/2022   Burkitt lymphoma of lymph nodes of multiple regions [facility]) 11/14/2022   Counseling regarding advance care planning and goals of care 11/14/2022   Chest mass 11/10/2022   Exposure to chlamydia 10/24/2022   Submental lymphadenopathy 09/05/2022   Goals of care,  counseling/discussion 07/26/2022   History of syphilis 07/26/2022   Neck swelling 07/26/2022   AIDS (acquired immune deficiency syndrome) (HCC) 07/25/2022    PCP: Frankie Israel, MD  REFERRING PROVIDER: Frankie Israel, MD  REFERRING DIAG: C83.78 (ICD-10-CM) - Burkitt lymphoma of lymph nodes of multiple regions [facility])  THERAPY DIAG:  Muscle weakness (generalized)  Abnormality of gait and mobility  Rationale for Evaluation and Treatment: Rehabilitation  ONSET DATE: 6-7 months  SUBJECTIVE:  SUBJECTIVE STATEMENT: Patient reports he is feeling really good today. He reports 8/10 pain.  Eval: Rt leg weakness and gives out sometimes, cramps throughout the night and sometimes while walking. Uses SPC and this helps a lot with prevention of falling when it gives out.  No pattern with walking or standing time with weakness.  4-5 falls. Does have numbness and tingling in both legs throughout bil thighs Did finish cancer treatments last year, does still port. Chemo completed. Wasn't very active during chemo with exhausted and feels so weak now.    PAIN: 04/20/2024 Are you having pain? Yes NPRS scale: 8/10 Pain location: bil thighs  Pain type: aching and dull does report sharp with cramping at night Pain description: constant   Aggravating factors: cramping at night is highest pain; walking, standing Relieving factors: sitting to rest  PRECAUTIONS: Other: recent cancer   RED FLAGS: None   WEIGHT BEARING RESTRICTIONS: No  FALLS:  Has patient fallen in last 6 months? Yes. Number of falls 5 (most recent Sunday)  03/23/24: No falls in the past 4 weeks  LIVING ENVIRONMENT: Lives with: lives with their family Lives in: House/apartment Stairs: No Has following equipment at home: Single point  cane, FWW  OCCUPATION: off work right now, CNA  PLOF: Independent  PATIENT GOALS: to feel stronger and get back to how I was before cancer  PERTINENT HISTORY:  HIV/AIDs; syphilis, rapidly growing right cervical nodal mass extending into the level 2 nodal station of the right supraclavicular nodal station and extension into the superficial right sternocleidomastoid muscle.  There is also hypermetabolic right axillary nodal disease.diagnosed in November/December 2023 Sexual abuse: NO  NEXT MD VISIT: 04/05/24 - oncologist  OBJECTIVE:  Note: Objective measures were completed at Evaluation unless otherwise noted.  DIAGNOSTIC FINDINGS:   Xray - hip 01/27/24 Mild degenerative changes to the hip joint  CT NECK WITH CONTRAST 12/28/23  IMPRESSION: 1. Resolution of the large indistinctly marginated mass lesion throughout the anterior lower neck, previously more extensive on the right than the left. Mild scarring of the right sternocleidomastoid muscle but no evidence of recurrent mass lesion. 2. Stable appearance of level 1 lymph nodes, the largest showing short axis dimension of 1 cm. Previously seen mass/adenopathy in the right level 2 region has resolved without residual. Other cervical chain lymph nodes on both sides of the neck are within normal limits in size.   PATIENT SURVEYS:  Initial eval: LEFS Lower Extremity Functional Score: 22 / 80 = 27.5 %  indicative of moderate functional limitation   03/23/24: LEFS Lower Extremity Functional Score: 12 / 80 = 15 % indicative of severe functional limitation  (Patient explains he paid more attention to the details of the questions)  04/20/2024 LEFS: 46/80 57.5%  COGNITION: Overall cognitive status: Within functional limits for tasks assessed     SENSATION: Numbness in both thighs worse with pain, can feel pressure but light touch   POSTURE: rounded shoulders and anterior pelvic tilt  PALPATION: TTP at Rt greater trochanter,  tightness noted in Rt hip flexor, Rt lumbar paraspinal  LOWER EXTREMITY ROM:  WFL without pain for full flexion however at end range extension pt reports pain at proximal lateral hip on Rt  LOWER EXTREMITY MMT:  MMT Right Eval */5 Right  03/23/24 Left eval Left 03/23/24  Hip flexion 3 3 4+ 4+  Hip extension 3+ 3+ 4+ 4+  Hip abduction 3+ 4+ 4 5  Hip adduction 3+ 4+ 4+ 4+  Hip internal rotation  Hip external rotation      Knee flexion 3+ 4 4 4+  Knee extension 3 3+ 4 4+  Ankle dorsiflexion 3+ 4- 4+ 4+  Ankle plantarflexion      Ankle inversion      Ankle eversion       (Blank rows = not tested)   FUNCTIONAL TESTS:  Initial eval: 5 times sit to stand: 24.06s with use of hands and felt 7/10 on BORG with this  03/23/24: 5 times sit to stand: 17.85 s with use of hands and felt 3/10 on BORG with this  GAIT: Distance walked: 271' (completed last session) Assistive device utilized: Single point cane Level of assistance: Modified independence Comments: greatly decreased cadence, decreased step length and height at Rt leg                                                                                                                                TREATMENT DATE:  04/20/24 Treadmill x 8 min, beginning at slow speed and working up to 1.5 mph (incline was on grade 3 for 4 mins then decreased) LEFS:46/80 57.5% Seated LAQ 2 x 10 (6 # on right LE , 7 lbs on left LE)  Seated marching x 20 (6 # on right LE , 7 lbs on left LE)  Sit to stand  2 x 10 Side stepping in bar green TB x 4 laps Squatting in barre x 10 Gait training down long hallway x 1 laps   04/15/24 Treadmill x 8 min, beginning at slow speed and working up to 2.0 mph Gait training down long hallway x 2 laps Seated LAQ 2 x 10 (6 # on right LE , 7 lbs on left LE)  Seated marching x 20 Seated hip ER with 6lb and 7lbs 2 x 10 each Standing heel raises 2 x 10 Squatting x 10 Sit to stand  2 x 10  04/08/24 Gait training  down long hallway x 2 laps Seated LAQ 2 x 10 (4 # on right LE , 7 lbs on left LE)  Weight shifts on airex (forwards, sideways) x 1 min each 6inch step ups in // barre x 10 bilateral  Side stepping in // bar with yellow TB x 2 laps Squatting in // bar x 10 NuStep level 4 x 10 mins - PT present to discuss status  04/05/24 Nustep x 10 min Hurdles (forwards) step over pattern x 4 laps 6 inch step taps holding 7# BD in // bar 2 x 20 Sit to stand from mat table x 10 holding 7# DB Lunges fwd x 10 each LE  Lunges lateral x 10 each LE Lateral band walks with green TB x 2 laps    PATIENT EDUCATION:  Education details: [email or web address] Person educated: Patient Education method: Explanation, Demonstration, Tactile cues, Verbal cues, and Handouts Education comprehension: verbalized understanding, returned demonstration, verbal cues required, tactile cues required, and needs further education  HOME  EXERCISE PROGRAM: Access Code: [email or web address] URL: [email or web address] Date: 02/03/2024 Prepared by: Concha Deed  Exercises - Supine March  - 1 x daily - 7 x weekly - 2 sets - 10 reps - Bent Knee Fallouts  - 1 x daily - 7 x weekly - 2 sets - 10 reps - Supine Hip Adductor Squeeze with Small Ball  - 1 x daily - 7 x weekly - 2 sets - 10 reps - Sit to Stand  - 3 x daily - 7 x weekly - 1-2 sets - 5-10 reps - Hooklying Clamshell with Resistance  - 1 x daily - 3 x weekly - 2 sets - 10 reps  ASSESSMENT:  CLINICAL IMPRESSION: Aydrian presented to therapy with 8/10 pain. Re-administered LEFS and patient's score improved drastically since evaluation. He tolerated treadmill well today with an incline for 4 minutes. He required verbal cues for correct form when performing squats. Overall, patient should continue to progress well with physical therapy. Patient will benefit from skilled PT to address the below impairments and improve overall function.      Eval: Patient is a 43 y.o. male  who was seen today for  physical therapy evaluation and treatment for Rt leg weakness. Pt has complex history of Burkitt lymphoma with chemo treatments now completed. Pt reports he was exhausted during his treatments and feels like he is very weak and fatigued now. Does have numbness in bil thighs only posterior and anteriorly and feels like his Rt leg "gives out" to the point of him having 5 falls in the last several months. Pt does demonstrate gait deficits, very slow cadence and heavy trunk lean onto [facility] which pt uses on RT hand reporting he has more able to catch himself with the cane him this hand if his knee "gives". Pt found to have decreased strength bil LE but worse in Rt leg. Pt demonstrated high fall risk based on 5xSTS and after completing this was very fatigued and needed several minutes to recover stating 7/10 on BORG scale. LEFS score 22 / 80. 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   OBJECTIVE IMPAIRMENTS: Abnormal gait, decreased activity tolerance, decreased balance, decreased coordination, decreased endurance, decreased knowledge of use of DME, decreased mobility, difficulty walking, decreased strength, increased fascial restrictions, impaired perceived functional ability, increased muscle spasms, impaired flexibility, impaired sensation, improper body mechanics, postural dysfunction, and pain.   ACTIVITY LIMITATIONS: carrying, lifting, bending, sitting, standing, squatting, stairs, transfers, bed mobility, and locomotion level  PARTICIPATION LIMITATIONS: cleaning, laundry, interpersonal relationship, driving, shopping, community activity, occupation, and yard work  PERSONAL FACTORS: Fitness, Time since onset of injury/illness/exacerbation, and 1 comorbidity: medical history   are also affecting patient's functional outcome.   REHAB POTENTIAL: Good  CLINICAL DECISION MAKING: Evolving/moderate complexity  EVALUATION  COMPLEXITY: Moderate   GOALS: Goals reviewed with patient? Yes  SHORT TERM GOALS: Target date: 02/25/24 Pt to be I with HEP.  Baseline: Goal status: In progress (compliance is poor)  2.  Pt to report improved LEFS score of 30 to improve toward lowering functional impairment limitations.  Baseline:  Goal status: MET 04/20/2024  3.  Pt to demonstrate ability to complete body weight squats without hands for 10 reps to improve LE strength for tolerance for walking.  Baseline:  Goal status: MET 03/23/24  4.  Pt to demonstrate improved gait mechanics with even step length and height to be able to  decreased fall risk with community ambulation.   Baseline:  Goal status: MET 03/23/24   LONG TERM GOALS: Target date: 05/18/24  Pt to be I with advanced HEP.  Baseline:  Goal status: In Progress  2.  Pt to demonstrate 10# squats without compensatory strategies for improved hip strength for tolerance to return to work Baseline:  Goal status: In Progress  3.  Pt to report ability to walk with or without AD for at least 30 mins with no increase in pain for tolerance to return to work.  Baseline:  Goal status: In Progress  4.  Pt to demonstrate 5xSTS in 7s to achieve age related norm for decreased fall risk Baseline:  Goal status: In Progress  5.  Pt to demonstrate at least 4+/5 bil hip strength for improved pelvic stability and functional squats without leakage.  Baseline:  Goal status: In Progress   PLAN:  PT FREQUENCY: 2x/week  PT DURATION: 8 weeks  PLANNED INTERVENTIONS: 97110-Therapeutic exercises, 97530- Therapeutic activity, [id number]- Neuromuscular re-education, 97535- Self Care, [id number]- Manual therapy, [phone]- Gait training, [phone]- Aquatic Therapy, [phone]- Electrical stimulation (manual), [id number]- Vasopneumatic device, [id number]- Ultrasound, [id number]- Ionotophoresis 4mg /ml Dexamethasone , Patient/Family education, Balance training, Stair training, Taping, Dry Needling, Joint mobilization, Spinal  mobilization, Scar mobilization, DME instructions, Cryotherapy, Moist heat, and Biofeedback  PLAN FOR NEXT SESSION:   leg press; Increase walking distance; continue LE strengthening ;standing balance, core strengthening   Penelope Bowie, PT 04/20/24 12:49 PM [facility] [address] [place], [place] [address] Phone # [phone] Fax [phone]

## 2024-04-22 ENCOUNTER — Encounter: Payer: Self-pay | Admitting: Physical Therapy

## 2024-04-22 ENCOUNTER — Ambulatory Visit: Admitting: Physical Therapy

## 2024-04-22 DIAGNOSIS — M6281 Muscle weakness (generalized): Secondary | ICD-10-CM | POA: Diagnosis not present

## 2024-04-22 DIAGNOSIS — R262 Difficulty in walking, not elsewhere classified: Secondary | ICD-10-CM

## 2024-04-22 DIAGNOSIS — R296 Repeated falls: Secondary | ICD-10-CM

## 2024-04-22 DIAGNOSIS — R252 Cramp and spasm: Secondary | ICD-10-CM

## 2024-04-22 DIAGNOSIS — R29898 Other symptoms and signs involving the musculoskeletal system: Secondary | ICD-10-CM

## 2024-04-22 NOTE — Therapy (Signed)
 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)    HIV infection (HCC)    Internal hemorrhoids    Obesity    Rectal ulcer    Past Surgical History:  Procedure Laterality Date   IR IMAGING GUIDED PORT INSERTION  11/18/2022   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   Neutropenia (HCC) 06/26/2023   Rash 06/26/2023   Intractable back pain suspect Granix-related 02/21/2023   Lactic acidosis 02/21/2023   Thrombocytopenia (HCC) 02/01/2023   Hyponatremia 01/31/2023   Hypokalemia 01/23/2023   Hematuria 01/11/2023   Neutropenic fever (HCC) 01/10/2023   Pancytopenia (HCC) 01/10/2023   Acute cystitis 01/10/2023   Anemia 01/01/2023   Healthcare maintenance 12/19/2022   Encounter for chemotherapy management 12/12/2022   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HCC) 12/10/2022   Decreased appetite 12/02/2022   Drug-induced constipation 11/21/2022   Nausea without vomiting 11/21/2022   Acute nonintractable headache 11/21/2022   Encounter for antineoplastic chemotherapy 11/18/2022   Burkitt lymphoma of lymph nodes of multiple regions [facility]) 11/14/2022   Counseling regarding advance care planning and goals of care 11/14/2022   Chest mass 11/10/2022   Exposure to chlamydia 10/24/2022   Submental lymphadenopathy 09/05/2022   Goals of care,  counseling/discussion 07/26/2022   History of syphilis 07/26/2022   Neck swelling 07/26/2022   AIDS (acquired immune deficiency syndrome) (HCC) 07/25/2022    PCP: Frankie Israel, MD  REFERRING PROVIDER: Frankie Israel, MD  REFERRING DIAG: C83.78 (ICD-10-CM) - Burkitt lymphoma of lymph nodes of multiple regions [facility])  THERAPY DIAG:  Muscle weakness (generalized)  Abnormality of gait and mobility  Rationale for Evaluation and Treatment: Rehabilitation  ONSET DATE: 6-7 months  SUBJECTIVE:  SUBJECTIVE STATEMENT: Patient reports he is doing okay today. Pain is 5/10.  Eval: Rt leg weakness and gives out sometimes, cramps throughout the night and sometimes while walking. Uses SPC and this helps a lot with prevention of falling when it gives out.  No pattern with walking or standing time with weakness.  4-5 falls. Does have numbness and tingling in both legs throughout bil thighs Did finish cancer treatments last year, does still port. Chemo completed. Wasn't very active during chemo with exhausted and feels so weak now.    PAIN: 04/22/2024 Are you having pain? Yes NPRS scale: 5/10 Pain location: bil thighs  Pain type: aching and dull does report sharp with cramping at night Pain description: constant   Aggravating factors: cramping at night is highest pain; walking, standing Relieving factors: sitting to rest  PRECAUTIONS: Other: recent cancer   RED FLAGS: None   WEIGHT BEARING RESTRICTIONS: No  FALLS:  Has patient fallen in last 6 months? Yes. Number of falls 5 (most recent Sunday)  03/23/24: No falls in the past 4 weeks  LIVING ENVIRONMENT: Lives with: lives with their family Lives in: House/apartment Stairs: No Has following equipment at home: Single point cane,  FWW  OCCUPATION: off work right now, CNA  PLOF: Independent  PATIENT GOALS: to feel stronger and get back to how I was before cancer  PERTINENT HISTORY:  HIV/AIDs; syphilis, rapidly growing right cervical nodal mass extending into the level 2 nodal station of the right supraclavicular nodal station and extension into the superficial right sternocleidomastoid muscle.  There is also hypermetabolic right axillary nodal disease.diagnosed in November/December 2023 Sexual abuse: NO  NEXT MD VISIT: 04/05/24 - oncologist  OBJECTIVE:  Note: Objective measures were completed at Evaluation unless otherwise noted.  DIAGNOSTIC FINDINGS:   Xray - hip 01/27/24 Mild degenerative changes to the hip joint  CT NECK WITH CONTRAST 12/28/23  IMPRESSION: 1. Resolution of the large indistinctly marginated mass lesion throughout the anterior lower neck, previously more extensive on the right than the left. Mild scarring of the right sternocleidomastoid muscle but no evidence of recurrent mass lesion. 2. Stable appearance of level 1 lymph nodes, the largest showing short axis dimension of 1 cm. Previously seen mass/adenopathy in the right level 2 region has resolved without residual. Other cervical chain lymph nodes on both sides of the neck are within normal limits in size.   PATIENT SURVEYS:  Initial eval: LEFS Lower Extremity Functional Score: 22 / 80 = 27.5 % indicative of moderate functional limitation   03/23/24: LEFS Lower Extremity Functional Score: 12 / 80 = 15 % indicative of severe functional limitation  (Patient explains he paid more attention to the details of the questions)  04/20/2024 LEFS: 46/80 57.5%  COGNITION: Overall cognitive status: Within functional limits for tasks assessed     SENSATION: Numbness in both thighs worse with pain, can feel pressure but light touch   POSTURE: rounded shoulders and anterior pelvic tilt  PALPATION: TTP at Rt greater trochanter, tightness  noted in Rt hip flexor, Rt lumbar paraspinal  LOWER EXTREMITY ROM:  WFL without pain for full flexion however at end range extension pt reports pain at proximal lateral hip on Rt  LOWER EXTREMITY MMT:  MMT Right Eval */5 Right  03/23/24 Left eval Left 03/23/24  Hip flexion 3 3 4+ 4+  Hip extension 3+ 3+ 4+ 4+  Hip abduction 3+ 4+ 4 5  Hip adduction 3+ 4+ 4+ 4+  Hip internal rotation  Hip external rotation      Knee flexion 3+ 4 4 4+  Knee extension 3 3+ 4 4+  Ankle dorsiflexion 3+ 4- 4+ 4+  Ankle plantarflexion      Ankle inversion      Ankle eversion       (Blank rows = not tested)   FUNCTIONAL TESTS:  Initial eval: 5 times sit to stand: 24.06s with use of hands and felt 7/10 on BORG with this  03/23/24: 5 times sit to stand: 17.85 s with use of hands and felt 3/10 on BORG with this  GAIT: Distance walked: 271' (completed last session) Assistive device utilized: Single point cane Level of assistance: Modified independence Comments: greatly decreased cadence, decreased step length and height at Rt leg                                                                                                                                TREATMENT DATE:  04/22/24 Patient presents 15 mins late to appointment Leg Press 70# 2 x 10 Stair negotiation x 2 step ups with Rt and x 2 step ups with Lt  Seated LAQ 2 x 10 (6 # on right LE , 7 lbs on left LE)  Treadmill x7 beginning at slow speed and working up to 1.7 mph   04/20/24 Treadmill x 8 min, beginning at slow speed and working up to 1.5 mph (incline was on grade 3 for 4 mins then decreased) LEFS:46/80 57.5% Seated LAQ 2 x 10 (6 # on right LE , 7 lbs on left LE)  Seated marching x 20 (6 # on right LE , 7 lbs on left LE)  Sit to stand  2 x 10 Side stepping in bar green TB x 4 laps Squatting in barre x 10 Gait training down long hallway x 1 laps   04/15/24 Treadmill x 8 min, beginning at slow speed and working up to 2.0  mph Gait training down long hallway x 2 laps Seated LAQ 2 x 10 (6 # on right LE , 7 lbs on left LE)  Seated marching x 20 Seated hip ER with 6lb and 7lbs 2 x 10 each Standing heel raises 2 x 10 Squatting x 10 Sit to stand  2 x 10  04/08/24 Gait training down long hallway x 2 laps Seated LAQ 2 x 10 (4 # on right LE , 7 lbs on left LE)  Weight shifts on airex (forwards, sideways) x 1 min each 6inch step ups in // barre x 10 bilateral  Side stepping in // bar with yellow TB x 2 laps Squatting in // bar x 10 NuStep level 4 x 10 mins - PT present to discuss status     PATIENT EDUCATION:  Education details: [id number] Person educated: Patient Education method: Explanation, Demonstration, Tactile cues, Verbal cues, and Handouts Education comprehension: verbalized understanding, returned demonstration, verbal cues required, tactile cues required, and needs further education  HOME EXERCISE PROGRAM: Access Code: [email or web address] URL: [email or web address] Date: 02/03/2024 Prepared by: Concha Deed  Exercises - Supine March  - 1 x daily - 7 x weekly - 2 sets - 10 reps - Bent Knee Fallouts  - 1 x daily - 7 x weekly - 2 sets - 10 reps - Supine Hip Adductor Squeeze with Small Ball  - 1 x daily - 7 x weekly - 2 sets - 10 reps - Sit to Stand  - 3 x daily - 7 x weekly - 1-2 sets - 5-10 reps - Hooklying Clamshell with Resistance  - 1 x daily - 3 x weekly - 2 sets - 10 reps  ASSESSMENT:  CLINICAL IMPRESSION: Hearld presents to therapy with lower pain levels than normal. Patient tolerated the leg press and stair negotiation with some encouragement. He required SBA for stair negotiation, but he completed activity with no problems. Patient is progressing well with physical therapy and he is getting stronger. Patient will benefit from skilled PT to address the below impairments and improve overall function.      Eval: Patient is a 43 y.o. male  who was seen today for physical therapy evaluation  and treatment for Rt leg weakness. Pt has complex history of Burkitt lymphoma with chemo treatments now completed. Pt reports he was exhausted during his treatments and feels like he is very weak and fatigued now. Does have numbness in bil thighs only posterior and anteriorly and feels like his Rt leg "gives out" to the point of him having 5 falls in the last several months. Pt does demonstrate gait deficits, very slow cadence and heavy trunk lean onto [facility] which pt uses on RT hand reporting he has more able to catch himself with the cane him this hand if his knee "gives". Pt found to have decreased strength bil LE but worse in Rt leg. Pt demonstrated high fall risk based on 5xSTS and after completing this was very fatigued and needed several minutes to recover stating 7/10 on BORG scale. LEFS score 22 / 80. 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   OBJECTIVE IMPAIRMENTS: Abnormal gait, decreased activity tolerance, decreased balance, decreased coordination, decreased endurance, decreased knowledge of use of DME, decreased mobility, difficulty walking, decreased strength, increased fascial restrictions, impaired perceived functional ability, increased muscle spasms, impaired flexibility, impaired sensation, improper body mechanics, postural dysfunction, and pain.   ACTIVITY LIMITATIONS: carrying, lifting, bending, sitting, standing, squatting, stairs, transfers, bed mobility, and locomotion level  PARTICIPATION LIMITATIONS: cleaning, laundry, interpersonal relationship, driving, shopping, community activity, occupation, and yard work  PERSONAL FACTORS: Fitness, Time since onset of injury/illness/exacerbation, and 1 comorbidity: medical history  are also affecting patient's functional outcome.   REHAB POTENTIAL: Good  CLINICAL DECISION MAKING: Evolving/moderate complexity  EVALUATION COMPLEXITY:  Moderate   GOALS: Goals reviewed with patient? Yes  SHORT TERM GOALS: Target date: 02/25/24 Pt to be I with HEP.  Baseline: Goal status: In progress (compliance is poor)  2.  Pt to report improved LEFS score of 30 to improve toward lowering functional impairment limitations.  Baseline:  Goal status: MET 04/20/2024  3.  Pt to demonstrate ability to complete body weight squats without hands for 10 reps to improve LE strength for tolerance for walking.  Baseline:  Goal status: MET 03/23/24  4.  Pt to demonstrate improved gait mechanics with even step length and height to be able to decreased  fall risk with community ambulation.   Baseline:  Goal status: MET 03/23/24   LONG TERM GOALS: Target date: 05/18/24  Pt to be I with advanced HEP.  Baseline:  Goal status: In Progress  2.  Pt to demonstrate 10# squats without compensatory strategies for improved hip strength for tolerance to return to work Baseline:  Goal status: In Progress  3.  Pt to report ability to walk with or without AD for at least 30 mins with no increase in pain for tolerance to return to work.  Baseline:  Goal status: In Progress  4.  Pt to demonstrate 5xSTS in 7s to achieve age related norm for decreased fall risk Baseline:  Goal status: In Progress  5.  Pt to demonstrate at least 4+/5 bil hip strength for improved pelvic stability and functional squats without leakage.  Baseline:  Goal status: In Progress   PLAN:  PT FREQUENCY: 2x/week  PT DURATION: 8 weeks  PLANNED INTERVENTIONS: 97110-Therapeutic exercises, 97530- Therapeutic activity, 97112- Neuromuscular re-education, [phone]- Self Care, 60454- Manual therapy, [phone]- Gait training, [phone]- Aquatic Therapy, [phone]- Electrical stimulation (manual), [id number]- Vasopneumatic device, [id number]- Ultrasound, [id number]- Ionotophoresis 4mg /ml Dexamethasone , Patient/Family education, Balance training, Stair training, Taping, Dry Needling, Joint mobilization, Spinal mobilization,  Scar mobilization, DME instructions, Cryotherapy, Moist heat, and Biofeedback  PLAN FOR NEXT SESSION:   continue leg press and stair negotiation; LE strengthening ;standing balance, core strengthening   Penelope Bowie, PT 04/22/24 11:54 AM [facility] [address] [place], [place] [address] Phone # [phone] Fax [phone]

## 2024-04-27 ENCOUNTER — Other Ambulatory Visit: Payer: Self-pay

## 2024-04-27 ENCOUNTER — Ambulatory Visit: Admitting: Physical Therapy

## 2024-04-27 ENCOUNTER — Encounter: Payer: Self-pay | Admitting: Physical Therapy

## 2024-04-27 DIAGNOSIS — R29898 Other symptoms and signs involving the musculoskeletal system: Secondary | ICD-10-CM

## 2024-04-27 DIAGNOSIS — R262 Difficulty in walking, not elsewhere classified: Secondary | ICD-10-CM

## 2024-04-27 DIAGNOSIS — R296 Repeated falls: Secondary | ICD-10-CM

## 2024-04-27 DIAGNOSIS — M6281 Muscle weakness (generalized): Secondary | ICD-10-CM

## 2024-04-27 DIAGNOSIS — R252 Cramp and spasm: Secondary | ICD-10-CM

## 2024-04-27 NOTE — Therapy (Signed)
 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)    HIV infection (HCC)    Internal hemorrhoids    Obesity    Rectal ulcer    Past Surgical History:  Procedure Laterality Date   IR IMAGING GUIDED PORT INSERTION  11/18/2022   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   Neutropenia (HCC) 06/26/2023   Rash 06/26/2023   Intractable back pain suspect Granix-related 02/21/2023   Lactic acidosis 02/21/2023   Thrombocytopenia (HCC) 02/01/2023   Hyponatremia 01/31/2023   Hypokalemia 01/23/2023   Hematuria 01/11/2023   Neutropenic fever (HCC) 01/10/2023   Pancytopenia (HCC) 01/10/2023   Acute cystitis 01/10/2023   Anemia 01/01/2023   Healthcare maintenance 12/19/2022   Encounter for chemotherapy management 12/12/2022   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HCC) 12/10/2022   Decreased appetite 12/02/2022   Drug-induced constipation 11/21/2022   Nausea without vomiting 11/21/2022   Acute nonintractable headache 11/21/2022   Encounter for antineoplastic chemotherapy 11/18/2022   Burkitt lymphoma of lymph nodes of multiple regions [facility]) 11/14/2022   Counseling regarding advance care planning and goals of care 11/14/2022   Chest mass 11/10/2022   Exposure to chlamydia 10/24/2022   Submental lymphadenopathy 09/05/2022   Goals of care,  counseling/discussion 07/26/2022   History of syphilis 07/26/2022   Neck swelling 07/26/2022   AIDS (acquired immune deficiency syndrome) (HCC) 07/25/2022    PCP: Frankie Israel, MD  REFERRING PROVIDER: Frankie Israel, MD  REFERRING DIAG: C83.78 (ICD-10-CM) - Burkitt lymphoma of lymph nodes of multiple regions [facility])  THERAPY DIAG:  Muscle weakness (generalized)  Abnormality of gait and mobility  Rationale for Evaluation and Treatment: Rehabilitation  ONSET DATE: 6-7 months  SUBJECTIVE:  SUBJECTIVE STATEMENT: Patient reports he is doing okay today. No new complaints.   Eval: Rt leg weakness and gives out sometimes, cramps throughout the night and sometimes while walking. Uses SPC and this helps a lot with prevention of falling when it gives out.  No pattern with walking or standing time with weakness.  4-5 falls. Does have numbness and tingling in both legs throughout bil thighs Did finish cancer treatments last year, does still port. Chemo completed. Wasn't very active during chemo with exhausted and feels so weak now.    PAIN: 04/27/2024 Are you having pain? Yes NPRS scale: 5/10 Pain location: bil thighs  Pain type: aching and dull does report sharp with cramping at night Pain description: constant   Aggravating factors: cramping at night is highest pain; walking, standing Relieving factors: sitting to rest  PRECAUTIONS: Other: recent cancer   RED FLAGS: None   WEIGHT BEARING RESTRICTIONS: No  FALLS:  Has patient fallen in last 6 months? Yes. Number of falls 5 (most recent Sunday)  03/23/24: No falls in the past 4 weeks  LIVING ENVIRONMENT: Lives with: lives with their family Lives in: House/apartment Stairs: No Has following equipment at home: Single point cane,  FWW  OCCUPATION: off work right now, CNA  PLOF: Independent  PATIENT GOALS: to feel stronger and get back to how I was before cancer  PERTINENT HISTORY:  HIV/AIDs; syphilis, rapidly growing right cervical nodal mass extending into the level 2 nodal station of the right supraclavicular nodal station and extension into the superficial right sternocleidomastoid muscle.  There is also hypermetabolic right axillary nodal disease.diagnosed in November/December 2023 Sexual abuse: NO  NEXT MD VISIT: 04/05/24 - oncologist  OBJECTIVE:  Note: Objective measures were completed at Evaluation unless otherwise noted.  DIAGNOSTIC FINDINGS:   Xray - hip 01/27/24 Mild degenerative changes to the hip joint  CT NECK WITH CONTRAST 12/28/23  IMPRESSION: 1. Resolution of the large indistinctly marginated mass lesion throughout the anterior lower neck, previously more extensive on the right than the left. Mild scarring of the right sternocleidomastoid muscle but no evidence of recurrent mass lesion. 2. Stable appearance of level 1 lymph nodes, the largest showing short axis dimension of 1 cm. Previously seen mass/adenopathy in the right level 2 region has resolved without residual. Other cervical chain lymph nodes on both sides of the neck are within normal limits in size.   PATIENT SURVEYS:  Initial eval: LEFS Lower Extremity Functional Score: 22 / 80 = 27.5 % indicative of moderate functional limitation   03/23/24: LEFS Lower Extremity Functional Score: 12 / 80 = 15 % indicative of severe functional limitation  (Patient explains he paid more attention to the details of the questions)  04/20/2024 LEFS: 46/80 57.5%  COGNITION: Overall cognitive status: Within functional limits for tasks assessed     SENSATION: Numbness in both thighs worse with pain, can feel pressure but light touch   POSTURE: rounded shoulders and anterior pelvic tilt  PALPATION: TTP at Rt greater trochanter, tightness  noted in Rt hip flexor, Rt lumbar paraspinal  LOWER EXTREMITY ROM:  WFL without pain for full flexion however at end range extension pt reports pain at proximal lateral hip on Rt  LOWER EXTREMITY MMT:  MMT Right Eval */5 Right  03/23/24 Left eval Left 03/23/24  Hip flexion 3 3 4+ 4+  Hip extension 3+ 3+ 4+ 4+  Hip abduction 3+ 4+ 4 5  Hip adduction 3+ 4+ 4+ 4+  Hip internal rotation  Hip external rotation      Knee flexion 3+ 4 4 4+  Knee extension 3 3+ 4 4+  Ankle dorsiflexion 3+ 4- 4+ 4+  Ankle plantarflexion      Ankle inversion      Ankle eversion       (Blank rows = not tested)   FUNCTIONAL TESTS:  Initial eval: 5 times sit to stand: 24.06s with use of hands and felt 7/10 on BORG with this  03/23/24: 5 times sit to stand: 17.85 s with use of hands and felt 3/10 on BORG with this  GAIT: Distance walked: 271' (completed last session) Assistive device utilized: Single point cane Level of assistance: Modified independence Comments: greatly decreased cadence, decreased step length and height at Rt leg                                                                                                                                TREATMENT DATE:  04/27/24 Patient presents 10 mins late to appointment Leg Press 75# 2 x 10 seat 9; unilateral 55# x 10 bilateral  Stair negotiations (4 steps ) x 4 Standing march with 15# unilateral KB X 15 each direction Around the worlds 10# KB x 10 each direction Sit to stand 10# KB x 15    04/22/24 Patient presents 15 mins late to appointment Leg Press 70# 2 x 10 Stair negotiation x 2 step ups with Rt and x 2 step ups with Lt  Seated LAQ 2 x 10 (6 # on right LE , 7 lbs on left LE)  Treadmill x7 beginning at slow speed and working up to 1.7 mph   04/20/24 Treadmill x 8 min, beginning at slow speed and working up to 1.5 mph (incline was on grade 3 for 4 mins then decreased) LEFS:46/80 57.5% Seated LAQ 2 x 10 (6 # on right LE , 7 lbs  on left LE)  Seated marching x 20 (6 # on right LE , 7 lbs on left LE)  Sit to stand  2 x 10 Side stepping in bar green TB x 4 laps Squatting in barre x 10 Gait training down long hallway x 1 laps      PATIENT EDUCATION:  Education details: GNFAOZHY Person educated: Patient Education method: Explanation, Demonstration, Tactile cues, Verbal cues, and Handouts Education comprehension: verbalized understanding, returned demonstration, verbal cues required, tactile cues required, and needs further education  HOME EXERCISE PROGRAM: Access Code: [email or web address] URL: [email or web address] Date: 02/03/2024 Prepared by: Concha Deed  Exercises - Supine March  - 1 x daily - 7 x weekly - 2 sets - 10 reps - Bent Knee Fallouts  - 1 x daily - 7 x weekly - 2 sets - 10 reps - Supine Hip Adductor Squeeze with Small Ball  - 1 x daily - 7 x weekly - 2 sets - 10 reps - Sit to Stand  - 3 x daily - 7 x weekly -  1-2 sets - 5-10 reps - Hooklying Clamshell with Resistance  - 1 x daily - 3 x weekly - 2 sets - 10 reps  ASSESSMENT:  CLINICAL IMPRESSION: Incorporated higher level strengthening exercises today and Altin tolerated them well. He required some encouragement to perform new exercises. Encouraged patient to do more chores at home (ex: taking groceries in from the door). Patient required verbal and visual cues for correct exercise performance. Patient will benefit from skilled PT to address the below impairments and improve overall function.      Eval: Patient is a 43 y.o. male  who was seen today for physical therapy evaluation and treatment for Rt leg weakness. Pt has complex history of Burkitt lymphoma with chemo treatments now completed. Pt reports he was exhausted during his treatments and feels like he is very weak and fatigued now. Does have numbness in bil thighs only posterior and anteriorly and feels like his Rt leg "gives out" to the point of him having 5 falls in the last several  months. Pt does demonstrate gait deficits, very slow cadence and heavy trunk lean onto [facility] which pt uses on RT hand reporting he has more able to catch himself with the cane him this hand if his knee "gives". Pt found to have decreased strength bil LE but worse in Rt leg. Pt demonstrated high fall risk based on 5xSTS and after completing this was very fatigued and needed several minutes to recover stating 7/10 on BORG scale. LEFS score 22 / 80. 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   OBJECTIVE IMPAIRMENTS: Abnormal gait, decreased activity tolerance, decreased balance, decreased coordination, decreased endurance, decreased knowledge of use of DME, decreased mobility, difficulty walking, decreased strength, increased fascial restrictions, impaired perceived functional ability, increased muscle spasms, impaired flexibility, impaired sensation, improper body mechanics, postural dysfunction, and pain.   ACTIVITY LIMITATIONS: carrying, lifting, bending, sitting, standing, squatting, stairs, transfers, bed mobility, and locomotion level  PARTICIPATION LIMITATIONS: cleaning, laundry, interpersonal relationship, driving, shopping, community activity, occupation, and yard work  PERSONAL FACTORS: Fitness, Time since onset of injury/illness/exacerbation, and 1 comorbidity: medical history  are also affecting patient's functional outcome.   REHAB POTENTIAL: Good  CLINICAL DECISION MAKING: Evolving/moderate complexity  EVALUATION COMPLEXITY: Moderate   GOALS: Goals reviewed with patient? Yes  SHORT TERM GOALS: Target date: 02/25/24 Pt to be I with HEP.  Baseline: Goal status: In progress (compliance is poor)  2.  Pt to report improved LEFS score of 30 to improve toward lowering functional impairment limitations.  Baseline:  Goal status: MET 04/20/2024  3.  Pt to demonstrate ability to complete body weight squats  without hands for 10 reps to improve LE strength for tolerance for walking.  Baseline:  Goal status: MET 03/23/24  4.  Pt to demonstrate improved gait mechanics with even step length and height to be able to decreased fall risk with community ambulation.   Baseline:  Goal status: MET 03/23/24   LONG TERM GOALS: Target date: 05/18/24  Pt to be I with advanced HEP.  Baseline:  Goal status: In Progress  2.  Pt to demonstrate 10# squats without compensatory strategies for improved hip strength for tolerance to return to work Baseline:  Goal status: In Progress  3.  Pt to report ability to walk with or without AD for at least 30 mins with no increase in pain for tolerance to return to  work.  Baseline:  Goal status: In Progress  4.  Pt to demonstrate 5xSTS in 7s to achieve age related norm for decreased fall risk Baseline:  Goal status: In Progress  5.  Pt to demonstrate at least 4+/5 bil hip strength for improved pelvic stability and functional squats without leakage.  Baseline:  Goal status: In Progress   PLAN:  PT FREQUENCY: 2x/week  PT DURATION: 8 weeks  PLANNED INTERVENTIONS: 97110-Therapeutic exercises, 97530- Therapeutic activity, 97112- Neuromuscular re-education, [phone]- Self Care, [id number]- Manual therapy, [phone]- Gait training, [phone]- Aquatic Therapy, [phone]- Electrical stimulation (manual), [id number]- Vasopneumatic device, [id number]- Ultrasound, 69629- Ionotophoresis 4mg /ml Dexamethasone , Patient/Family education, Balance training, Stair training, Taping, Dry Needling, Joint mobilization, Spinal mobilization, Scar mobilization, DME instructions, Cryotherapy, Moist heat, and Biofeedback  PLAN FOR NEXT SESSION:   continue LE strengthening ;standing balance, core strengthening   Penelope Bowie, PT 04/27/24 11:53 AM [facility] [address], [address] [place], [place] [address] Phone # [phone] Fax [phone]

## 2024-04-29 ENCOUNTER — Ambulatory Visit: Admitting: Physical Therapy

## 2024-04-29 ENCOUNTER — Telehealth: Payer: Self-pay | Admitting: Physical Therapy

## 2024-04-29 ENCOUNTER — Other Ambulatory Visit: Payer: Self-pay

## 2024-04-29 NOTE — Telephone Encounter (Signed)
 Spoke with patient about missed appointment today. The appointment slipped his mind because his niece was born today. Patient confirmed appointment for Tues 5/20 11amPenelope Bowie, PT 04/29/24 11:33 AM

## 2024-05-03 ENCOUNTER — Other Ambulatory Visit (HOSPITAL_COMMUNITY): Payer: Self-pay

## 2024-05-04 ENCOUNTER — Ambulatory Visit

## 2024-05-04 DIAGNOSIS — M6281 Muscle weakness (generalized): Secondary | ICD-10-CM

## 2024-05-04 DIAGNOSIS — R252 Cramp and spasm: Secondary | ICD-10-CM

## 2024-05-04 DIAGNOSIS — R296 Repeated falls: Secondary | ICD-10-CM

## 2024-05-04 DIAGNOSIS — R293 Abnormal posture: Secondary | ICD-10-CM

## 2024-05-04 DIAGNOSIS — R262 Difficulty in walking, not elsewhere classified: Secondary | ICD-10-CM

## 2024-05-04 DIAGNOSIS — R29898 Other symptoms and signs involving the musculoskeletal system: Secondary | ICD-10-CM

## 2024-05-04 NOTE — Therapy (Signed)
 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)    HIV infection (HCC)    Internal hemorrhoids    Obesity    Rectal ulcer    Past Surgical History:  Procedure Laterality Date   IR IMAGING GUIDED PORT INSERTION  11/18/2022   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   Neutropenia (HCC) 06/26/2023   Rash 06/26/2023   Intractable back pain suspect Granix-related 02/21/2023   Lactic acidosis 02/21/2023   Thrombocytopenia (HCC) 02/01/2023   Hyponatremia 01/31/2023   Hypokalemia 01/23/2023   Hematuria 01/11/2023   Neutropenic fever (HCC) 01/10/2023   Pancytopenia (HCC) 01/10/2023   Acute cystitis 01/10/2023   Anemia 01/01/2023   Healthcare maintenance 12/19/2022   Encounter for chemotherapy management 12/12/2022   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HCC) 12/10/2022   Decreased appetite 12/02/2022   Drug-induced constipation 11/21/2022   Nausea without vomiting 11/21/2022   Acute nonintractable headache 11/21/2022   Encounter for antineoplastic chemotherapy 11/18/2022   Burkitt lymphoma of lymph nodes of multiple regions [name]) 11/14/2022   Counseling regarding advance care planning and goals of care 11/14/2022   Chest mass 11/10/2022   Exposure to chlamydia 10/24/2022   Submental lymphadenopathy 09/05/2022   Goals of care, counseling/discussion  07/26/2022   History of syphilis 07/26/2022   Neck swelling 07/26/2022   AIDS (acquired immune deficiency syndrome) (HCC) 07/25/2022    PCP: Frankie Israel, MD  REFERRING PROVIDER: Frankie Israel, MD  REFERRING DIAG: C83.78 (ICD-10-CM) - Burkitt lymphoma of lymph nodes of multiple regions [facility])  THERAPY DIAG:  Muscle weakness (generalized)  Abnormality of gait and mobility  Rationale for Evaluation and Treatment: Rehabilitation  ONSET DATE: 6-7 months  SUBJECTIVE:  SUBJECTIVE STATEMENT: Patient reports he is doing okay today. No new complaints.   Eval: Rt leg weakness and gives out sometimes, cramps throughout the night and sometimes while walking. Uses SPC and this helps a lot with prevention of falling when it gives out.  No pattern with walking or standing time with weakness.  4-5 falls. Does have numbness and tingling in both legs throughout bil thighs Did finish cancer treatments last year, does still port. Chemo completed. Wasn't very active during chemo with exhausted and feels so weak now.    PAIN: 04/27/2024 Are you having pain? Yes NPRS scale: 5/10 Pain location: bil thighs  Pain type: aching and dull does report sharp with cramping at night Pain description: constant   Aggravating factors: cramping at night is highest pain; walking, standing Relieving factors: sitting to rest  PRECAUTIONS: Other: recent cancer   RED FLAGS: None   WEIGHT BEARING RESTRICTIONS: No  FALLS:  Has patient fallen in last 6 months? Yes. Number of falls 5 (most recent Sunday)  03/23/24: No falls in the past 4 weeks  LIVING ENVIRONMENT: Lives with: lives with their family Lives in: House/apartment Stairs: No Has following equipment at home: Single point cane, FWW  OCCUPATION: off  work right now, CNA  PLOF: Independent  PATIENT GOALS: to feel stronger and get back to how I was before cancer  PERTINENT HISTORY:  HIV/AIDs; syphilis, rapidly growing right cervical nodal mass extending into the level 2 nodal station of the right supraclavicular nodal station and extension into the superficial right sternocleidomastoid muscle.  There is also hypermetabolic right axillary nodal disease.diagnosed in November/December 2023 Sexual abuse: NO  NEXT MD VISIT: 04/05/24 - oncologist  OBJECTIVE:  Note: Objective measures were completed at Evaluation unless otherwise noted.  DIAGNOSTIC FINDINGS:   Xray - hip 01/27/24 Mild degenerative changes to the hip joint  CT NECK WITH CONTRAST 12/28/23  IMPRESSION: 1. Resolution of the large indistinctly marginated mass lesion throughout the anterior lower neck, previously more extensive on the right than the left. Mild scarring of the right sternocleidomastoid muscle but no evidence of recurrent mass lesion. 2. Stable appearance of level 1 lymph nodes, the largest showing short axis dimension of 1 cm. Previously seen mass/adenopathy in the right level 2 region has resolved without residual. Other cervical chain lymph nodes on both sides of the neck are within normal limits in size.   PATIENT SURVEYS:  Initial eval: LEFS Lower Extremity Functional Score: 22 / 80 = 27.5 % indicative of moderate functional limitation   03/23/24: LEFS Lower Extremity Functional Score: 12 / 80 = 15 % indicative of severe functional limitation  (Patient explains he paid more attention to the details of the questions)  04/20/2024 LEFS: 46/80 57.5%  COGNITION: Overall cognitive status: Within functional limits for tasks assessed     SENSATION: Numbness in both thighs worse with pain, can feel pressure but light touch   POSTURE: rounded shoulders and anterior pelvic tilt  PALPATION: TTP at Rt greater trochanter, tightness noted in Rt hip flexor,  Rt lumbar paraspinal  LOWER EXTREMITY ROM:  WFL without pain for full flexion however at end range extension pt reports pain at proximal lateral hip on Rt  LOWER EXTREMITY MMT:  MMT Right Eval */5 Right  03/23/24 Left eval Left 03/23/24  Hip flexion 3 3 4+ 4+  Hip extension 3+ 3+ 4+ 4+  Hip abduction 3+ 4+ 4 5  Hip adduction 3+ 4+ 4+ 4+  Hip internal rotation  Hip external rotation      Knee flexion 3+ 4 4 4+  Knee extension 3 3+ 4 4+  Ankle dorsiflexion 3+ 4- 4+ 4+  Ankle plantarflexion      Ankle inversion      Ankle eversion       (Blank rows = not tested)   FUNCTIONAL TESTS:  Initial eval: 5 times sit to stand: 24.06s with use of hands and felt 7/10 on BORG with this  03/23/24: 5 times sit to stand: 17.85 s with use of hands and felt 3/10 on BORG with this  GAIT: Distance walked: 271' (completed last session) Assistive device utilized: Single point cane Level of assistance: Modified independence Comments: greatly decreased cadence, decreased step length and height at Rt leg                                                                                                                                TREATMENT DATE:  05/04/24 Nustep x 5 min level 5 Step up 2 x 10 each LE fwd in // bars Lateral step overs x 20 (10 each side) in //  bars Squats 2 x 10 in // bars Seated LAQ emphasizing terminal extension with 6 lb ankle weight 2 x 10 Marching x 20 with 6 lbs Sit to stand 2 x 10 Seated clam with blue tband 2 x 10  04/27/24 Patient presents 10 mins late to appointment Leg Press 75# 2 x 10 seat 9; unilateral 55# x 10 bilateral  Stair negotiations (4 steps ) x 4 Standing march with 15# unilateral KB X 15 each direction Around [place] 10# KB x 10 each direction Sit to stand 10# KB x 15    04/22/24 Patient presents 15 mins late to appointment Leg Press 70# 2 x 10 Stair negotiation x 2 step ups with Rt and x 2 step ups with Lt  Seated LAQ 2 x 10 (6 # on right  LE , 7 lbs on left LE)  Treadmill x7 beginning at slow speed and working up to 1.7 mph   04/20/24 Treadmill x 8 min, beginning at slow speed and working up to 1.5 mph (incline was on grade 3 for 4 mins then decreased) LEFS:46/80 57.5% Seated LAQ 2 x 10 (6 # on right LE , 7 lbs on left LE)  Seated marching x 20 (6 # on right LE , 7 lbs on left LE)  Sit to stand  2 x 10 Side stepping in bar green TB x 4 laps Squatting in barre x 10 Gait training down long hallway x 1 laps      PATIENT EDUCATION:  Education details: [email or web address] Person educated: Patient Education method: Explanation, Demonstration, Tactile cues, Verbal cues, and Handouts Education comprehension: verbalized understanding, returned demonstration, verbal cues required, tactile cues required, and needs further education  HOME EXERCISE PROGRAM: Access Code: [email or web address] URL: [email or web address] Date: 02/03/2024 Prepared by: Concha Deed  Exercises - Supine March  -  1 x daily - 7 x weekly - 2 sets - 10 reps - Bent Knee Fallouts  - 1 x daily - 7 x weekly - 2 sets - 10 reps - Supine Hip Adductor Squeeze with Small Ball  - 1 x daily - 7 x weekly - 2 sets - 10 reps - Sit to Stand  - 3 x daily - 7 x weekly - 1-2 sets - 5-10 reps - Hooklying Clamshell with Resistance  - 1 x daily - 3 x weekly - 2 sets - 10 reps  ASSESSMENT:  CLINICAL IMPRESSION: Added functional step/stair training in // bars.  He was fatigued but no right LE instability.  Gait is normalizing.  Patient will benefit from skilled PT to address the below impairments and improve overall function.      Eval: Patient is a 43 y.o. male  who was seen today for physical therapy evaluation and treatment for Rt leg weakness. Pt has complex history of Burkitt lymphoma with chemo treatments now completed. Pt reports he was exhausted during his treatments and feels like he is very weak and fatigued now. Does have numbness in bil thighs only posterior and anteriorly  and feels like his Rt leg "gives out" to the point of him having 5 falls in the last several months. Pt does demonstrate gait deficits, very slow cadence and heavy trunk lean onto [facility] which pt uses on RT hand reporting he has more able to catch himself with the cane him this hand if his knee "gives". Pt found to have decreased strength bil LE but worse in Rt leg. Pt demonstrated high fall risk based on 5xSTS and after completing this was very fatigued and needed several minutes to recover stating 7/10 on BORG scale. LEFS score 22 / 80. 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   OBJECTIVE IMPAIRMENTS: Abnormal gait, decreased activity tolerance, decreased balance, decreased coordination, decreased endurance, decreased knowledge of use of DME, decreased mobility, difficulty walking, decreased strength, increased fascial restrictions, impaired perceived functional ability, increased muscle spasms, impaired flexibility, impaired sensation, improper body mechanics, postural dysfunction, and pain.   ACTIVITY LIMITATIONS: carrying, lifting, bending, sitting, standing, squatting, stairs, transfers, bed mobility, and locomotion level  PARTICIPATION LIMITATIONS: cleaning, laundry, interpersonal relationship, driving, shopping, community activity, occupation, and yard work  PERSONAL FACTORS: Fitness, Time since onset of injury/illness/exacerbation, and 1 comorbidity: medical history  are also affecting patient's functional outcome.   REHAB POTENTIAL: Good  CLINICAL DECISION MAKING: Evolving/moderate complexity  EVALUATION COMPLEXITY: Moderate   GOALS: Goals reviewed with patient? Yes  SHORT TERM GOALS: Target date: 02/25/24 Pt to be I with HEP.  Baseline: Goal status: In progress (compliance is poor)  2.  Pt to report improved LEFS score of 30 to improve toward lowering functional impairment limitations.  Baseline:   Goal status: MET 04/20/2024  3.  Pt to demonstrate ability to complete body weight squats without hands for 10 reps to improve LE strength for tolerance for walking.  Baseline:  Goal status: MET 03/23/24  4.  Pt to demonstrate improved gait mechanics with even step length and height to be able to decreased fall risk with community ambulation.   Baseline:  Goal status: MET 03/23/24   LONG TERM GOALS: Target date: 05/18/24  Pt to be I with advanced HEP.  Baseline:  Goal status: In Progress  2.  Pt to demonstrate 10# squats without  compensatory strategies for improved hip strength for tolerance to return to work Baseline:  Goal status: In Progress  3.  Pt to report ability to walk with or without AD for at least 30 mins with no increase in pain for tolerance to return to work.  Baseline:  Goal status: In Progress  4.  Pt to demonstrate 5xSTS in 7s to achieve age related norm for decreased fall risk Baseline:  Goal status: In Progress  5.  Pt to demonstrate at least 4+/5 bil hip strength for improved pelvic stability and functional squats without leakage.  Baseline:  Goal status: In Progress   PLAN:  PT FREQUENCY: 2x/week  PT DURATION: 8 weeks  PLANNED INTERVENTIONS: 97110-Therapeutic exercises, 97530- Therapeutic activity, [id number]- Neuromuscular re-education, 97535- Self Care, [id number]- Manual therapy, [phone]- Gait training, [phone]- Aquatic Therapy, [phone]- Electrical stimulation (manual), [id number]- Vasopneumatic device, [id number]- Ultrasound, [id number]- Ionotophoresis 4mg /ml Dexamethasone , Patient/Family education, Balance training, Stair training, Taping, Dry Needling, Joint mobilization, Spinal mobilization, Scar mobilization, DME instructions, Cryotherapy, Moist heat, and Biofeedback  PLAN FOR NEXT SESSION:   continue LE strengthening ;standing balance, core strengthening   Moksha Dorgan B. Rion Schnitzer, PT 05/04/24 6:55 PM [facility] [address], [address] [place],  [place] [address] Phone # [phone] Fax [phone]

## 2024-05-06 ENCOUNTER — Other Ambulatory Visit (HOSPITAL_COMMUNITY): Payer: Self-pay

## 2024-05-06 ENCOUNTER — Other Ambulatory Visit: Payer: Self-pay

## 2024-05-06 ENCOUNTER — Ambulatory Visit

## 2024-05-06 DIAGNOSIS — R29898 Other symptoms and signs involving the musculoskeletal system: Secondary | ICD-10-CM

## 2024-05-06 DIAGNOSIS — R262 Difficulty in walking, not elsewhere classified: Secondary | ICD-10-CM

## 2024-05-06 DIAGNOSIS — M6281 Muscle weakness (generalized): Secondary | ICD-10-CM | POA: Diagnosis not present

## 2024-05-06 DIAGNOSIS — R296 Repeated falls: Secondary | ICD-10-CM

## 2024-05-06 DIAGNOSIS — R252 Cramp and spasm: Secondary | ICD-10-CM

## 2024-05-06 DIAGNOSIS — B20 Human immunodeficiency virus [HIV] disease: Secondary | ICD-10-CM

## 2024-05-06 DIAGNOSIS — C8378 Burkitt lymphoma, lymph nodes of multiple sites: Secondary | ICD-10-CM

## 2024-05-06 MED ORDER — BIKTARVY 50-200-25 MG PO TABS
1.0000 | ORAL_TABLET | Freq: Every day | ORAL | 0 refills | Status: DC
  Filled 2024-05-06: qty 30, 30d supply, fill #0

## 2024-05-06 NOTE — Therapy (Signed)
 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)    HIV infection (HCC)    Internal hemorrhoids    Obesity    Rectal ulcer    Past Surgical History:  Procedure Laterality Date   IR IMAGING GUIDED PORT INSERTION  11/18/2022   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   Neutropenia (HCC) 06/26/2023   Rash 06/26/2023   Intractable back pain suspect Granix-related 02/21/2023   Lactic acidosis 02/21/2023   Thrombocytopenia (HCC) 02/01/2023   Hyponatremia 01/31/2023   Hypokalemia 01/23/2023   Hematuria 01/11/2023   Neutropenic fever (HCC) 01/10/2023   Pancytopenia (HCC) 01/10/2023   Acute cystitis 01/10/2023   Anemia 01/01/2023   Healthcare maintenance 12/19/2022   Encounter for chemotherapy management 12/12/2022   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HCC) 12/10/2022   Decreased appetite 12/02/2022   Drug-induced constipation 11/21/2022   Nausea without vomiting 11/21/2022   Acute nonintractable headache 11/21/2022   Encounter for antineoplastic chemotherapy 11/18/2022   Burkitt lymphoma of lymph nodes of multiple regions [facility]) 11/14/2022   Counseling regarding advance care planning and goals of care 11/14/2022   Chest mass 11/10/2022   Exposure to chlamydia 10/24/2022   Submental lymphadenopathy 09/05/2022   Goals of care, counseling/discussion 07/26/2022   History of  syphilis 07/26/2022   Neck swelling 07/26/2022   AIDS (acquired immune deficiency syndrome) (HCC) 07/25/2022    PCP: Frankie Israel, MD  REFERRING PROVIDER: Frankie Israel, MD  REFERRING DIAG: C83.78 (ICD-10-CM) - Burkitt lymphoma of lymph nodes of multiple regions [facility])  THERAPY DIAG:  Muscle weakness (generalized)  Abnormality of gait and mobility  Rationale for Evaluation and Treatment: Rehabilitation  ONSET DATE: 6-7 months  SUBJECTIVE:  SUBJECTIVE STATEMENT: Patient reports he is feeling his strength starting to improve.  He still fatigues but he is doing more.  He has not c/o any right knee instability in the past few visits.     Eval: Rt leg weakness and gives out sometimes, cramps throughout the night and sometimes while walking. Uses SPC and this helps a lot with prevention of falling when it gives out.  No pattern with walking or standing time with weakness.  4-5 falls. Does have numbness and tingling in both legs throughout bil thighs Did finish cancer treatments last year, does still port. Chemo completed. Wasn't very active during chemo with exhausted and feels so weak now.    PAIN: 04/27/2024 Are you having pain? Yes NPRS scale: 5/10 Pain location: bil thighs  Pain type: aching and dull does report sharp with cramping at night Pain description: constant   Aggravating factors: cramping at night is highest pain; walking, standing Relieving factors: sitting to rest  PRECAUTIONS: Other: recent cancer   RED FLAGS: None   WEIGHT BEARING RESTRICTIONS: No  FALLS:  Has patient fallen in last 6 months? Yes. Number of falls 5 (most recent Sunday)  03/23/24: No falls in the past 4 weeks  LIVING ENVIRONMENT: Lives with: lives with their family Lives in:  House/apartment Stairs: No Has following equipment at home: Single point cane, FWW  OCCUPATION: off work right now, CNA  PLOF: Independent  PATIENT GOALS: to feel stronger and get back to how I was before cancer  PERTINENT HISTORY:  HIV/AIDs; syphilis, rapidly growing right cervical nodal mass extending into the level 2 nodal station of the right supraclavicular nodal station and extension into the superficial right sternocleidomastoid muscle.  There is also hypermetabolic right axillary nodal disease.diagnosed in November/December 2023 Sexual abuse: NO  NEXT MD VISIT: 04/05/24 - oncologist  OBJECTIVE:  Note: Objective measures were completed at Evaluation unless otherwise noted.  DIAGNOSTIC FINDINGS:   Xray - hip 01/27/24 Mild degenerative changes to the hip joint  CT NECK WITH CONTRAST 12/28/23  IMPRESSION: 1. Resolution of the large indistinctly marginated mass lesion throughout the anterior lower neck, previously more extensive on the right than the left. Mild scarring of the right sternocleidomastoid muscle but no evidence of recurrent mass lesion. 2. Stable appearance of level 1 lymph nodes, the largest showing short axis dimension of 1 cm. Previously seen mass/adenopathy in the right level 2 region has resolved without residual. Other cervical chain lymph nodes on both sides of the neck are within normal limits in size.   PATIENT SURVEYS:  Initial eval: LEFS Lower Extremity Functional Score: 22 / 80 = 27.5 % indicative of moderate functional limitation   03/23/24: LEFS Lower Extremity Functional Score: 12 / 80 = 15 % indicative of severe functional limitation  (Patient explains he paid more attention to the details of the questions)  04/20/2024 LEFS: 46/80 57.5%  COGNITION: Overall cognitive status: Within functional limits for tasks assessed     SENSATION: Numbness in both thighs worse with pain, can feel pressure but light touch   POSTURE: rounded shoulders  and anterior pelvic tilt  PALPATION: TTP at Rt greater trochanter, tightness noted in Rt hip flexor, Rt lumbar paraspinal  LOWER EXTREMITY ROM:  WFL without pain for full flexion however at end range extension pt reports pain at proximal lateral hip on Rt  LOWER EXTREMITY MMT:  MMT Right Eval */5 Right  03/23/24 Left eval Left 03/23/24  Hip flexion 3 3 4+ 4+  Hip  extension 3+ 3+ 4+ 4+  Hip abduction 3+ 4+ 4 5  Hip adduction 3+ 4+ 4+ 4+  Hip internal rotation      Hip external rotation      Knee flexion 3+ 4 4 4+  Knee extension 3 3+ 4 4+  Ankle dorsiflexion 3+ 4- 4+ 4+  Ankle plantarflexion      Ankle inversion      Ankle eversion       (Blank rows = not tested)   FUNCTIONAL TESTS:  Initial eval: 5 times sit to stand: 24.06s with use of hands and felt 7/10 on BORG with this  03/23/24: 5 times sit to stand: 17.85 s with use of hands and felt 3/10 on BORG with this  GAIT: Distance walked: 271' (completed last session) Assistive device utilized: Single point cane Level of assistance: Modified independence Comments: greatly decreased cadence, decreased step length and height at Rt leg                                                                                                                                TREATMENT DATE:  05/06/24 Nustep x 5 min level 8 Lateral band walks x 5 laps in // bars (red tband tied) Fwd/back walking in // bars x 5 laps Step up 2 x 10 each LE fwd in // bars Lateral step overs x 20 (10 each side) in //  bars Seated LAQ emphasizing terminal extension with 6 lb ankle weight 2 x 10 Marching x 20 with 6 lbs Seated clam with blue tband 2 x 10 Sit to stand 2 x 10 Lunge to BOSU in // bars with bilateral UE support x 10 each LE Squats 2 x 10 in // bars  05/04/24 Nustep x 5 min level 5 Step up 2 x 10 each LE fwd in // bars Lateral step overs x 20 (10 each side) in //  bars Squats 2 x 10 in // bars Seated LAQ emphasizing terminal extension with  6 lb ankle weight 2 x 10 Marching x 20 with 6 lbs Sit to stand 2 x 10 Seated clam with blue tband 2 x 10  04/27/24 Patient presents 10 mins late to appointment Leg Press 75# 2 x 10 seat 9; unilateral 55# x 10 bilateral  Stair negotiations (4 steps ) x 4 Standing march with 15# unilateral KB X 15 each direction Around the worlds 10# KB x 10 each direction Sit to stand 10# KB x 15  PATIENT EDUCATION:  Education details: [email or web address] Person educated: Patient Education method: Explanation, Demonstration, Tactile cues, Verbal cues, and Handouts Education comprehension: verbalized understanding, returned demonstration, verbal cues required, tactile cues required, and needs further education  HOME EXERCISE PROGRAM: Access Code: [email or web address] URL: [email or web address] Date: 02/03/2024 Prepared by: Concha Deed  Exercises - Supine March  - 1 x daily - 7 x weekly - 2 sets - 10 reps - Bent Knee Fallouts  - 1 x daily -  7 x weekly - 2 sets - 10 reps - Supine Hip Adductor Squeeze with Small Ball  - 1 x daily - 7 x weekly - 2 sets - 10 reps - Sit to Stand  - 3 x daily - 7 x weekly - 1-2 sets - 5-10 reps - Hooklying Clamshell with Resistance  - 1 x daily - 3 x weekly - 2 sets - 10 reps  ASSESSMENT:  CLINICAL IMPRESSION: Azahel is beginning to gain functional strength and able to walk without cane in clinic for short distances.  We added proximal hip strengthening today.  He is more motivated and making steady improvement now.  He would benefit from continued skilled PT for functional strength training.     Eval: Patient is a 43 y.o. male  who was seen today for physical therapy evaluation and treatment for Rt leg weakness. Pt has complex history of Burkitt lymphoma with chemo treatments now completed. Pt reports he was exhausted during his treatments and feels like he is very weak and fatigued now. Does have numbness in bil thighs only posterior and anteriorly and feels like his Rt leg "gives out"  to the point of him having 5 falls in the last several months. Pt does demonstrate gait deficits, very slow cadence and heavy trunk lean onto [facility] which pt uses on RT hand reporting he has more able to catch himself with the cane him this hand if his knee "gives". Pt found to have decreased strength bil LE but worse in Rt leg. Pt demonstrated high fall risk based on 5xSTS and after completing this was very fatigued and needed several minutes to recover stating 7/10 on BORG scale. LEFS score 22 / 80. 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   OBJECTIVE IMPAIRMENTS: Abnormal gait, decreased activity tolerance, decreased balance, decreased coordination, decreased endurance, decreased knowledge of use of DME, decreased mobility, difficulty walking, decreased strength, increased fascial restrictions, impaired perceived functional ability, increased muscle spasms, impaired flexibility, impaired sensation, improper body mechanics, postural dysfunction, and pain.   ACTIVITY LIMITATIONS: carrying, lifting, bending, sitting, standing, squatting, stairs, transfers, bed mobility, and locomotion level  PARTICIPATION LIMITATIONS: cleaning, laundry, interpersonal relationship, driving, shopping, community activity, occupation, and yard work  PERSONAL FACTORS: Fitness, Time since onset of injury/illness/exacerbation, and 1 comorbidity: medical history  are also affecting patient's functional outcome.   REHAB POTENTIAL: Good  CLINICAL DECISION MAKING: Evolving/moderate complexity  EVALUATION COMPLEXITY: Moderate   GOALS: Goals reviewed with patient? Yes  SHORT TERM GOALS: Target date: 02/25/24 Pt to be I with HEP.  Baseline: Goal status: In progress (compliance is poor)  2.  Pt to report improved LEFS score of 30 to improve toward lowering functional impairment limitations.  Baseline:  Goal status: MET 04/20/2024  3.  Pt  to demonstrate ability to complete body weight squats without hands for 10 reps to improve LE strength for tolerance for walking.  Baseline:  Goal status: MET 03/23/24  4.  Pt to demonstrate improved gait mechanics with even step length and height to be able to decreased fall risk with community ambulation.   Baseline:  Goal status: MET 03/23/24   LONG TERM GOALS: Target date: 05/18/24  Pt to be I with advanced HEP.  Baseline:  Goal status: In Progress  2.  Pt to demonstrate 10# squats without compensatory strategies for improved hip strength for tolerance to return to work Baseline:  Goal status: In Progress  3.  Pt to report ability to walk with or without AD for at least 30 mins with no increase in pain for tolerance to return to work.  Baseline:  Goal status: In Progress  4.  Pt to demonstrate 5xSTS in 7s to achieve age related norm for decreased fall risk Baseline:  Goal status: In Progress  5.  Pt to demonstrate at least 4+/5 bil hip strength for improved pelvic stability and functional squats without leakage.  Baseline:  Goal status: In Progress   PLAN:  PT FREQUENCY: 2x/week  PT DURATION: 8 weeks  PLANNED INTERVENTIONS: 97110-Therapeutic exercises, 97530- Therapeutic activity, [id number]- Neuromuscular re-education, 97535- Self Care, [id number]- Manual therapy, [phone]- Gait training, [phone]- Aquatic Therapy, [phone]- Electrical stimulation (manual), [id number]- Vasopneumatic device, [id number]- Ultrasound, [id number]- Ionotophoresis 4mg /ml Dexamethasone , Patient/Family education, Balance training, Stair training, Taping, Dry Needling, Joint mobilization, Spinal mobilization, Scar mobilization, DME instructions, Cryotherapy, Moist heat, and Biofeedback  PLAN FOR NEXT SESSION:   continue LE strengthening ;standing balance, core strengthening  Mattea Seger B. Adaleah Forget, PT 05/06/24 11:48 AM [facility] [address], [address] [place], [place] [address] Phone # [phone] Fax  [phone]

## 2024-05-06 NOTE — Progress Notes (Signed)
 Specialty Pharmacy Refill Coordination Note  Logan Ward is a 43 y.o. male contacted today regarding refills of specialty medication(s) Bictegravir-Emtricitab-Tenofov (Biktarvy )   Patient requested Delivery   Delivery date: 05/10/24   Verified address: [address] [name] [address]   Medication will be filled on 05/07/24.

## 2024-05-07 ENCOUNTER — Encounter: Payer: Self-pay | Admitting: Hematology

## 2024-05-07 ENCOUNTER — Other Ambulatory Visit: Payer: Self-pay

## 2024-05-07 ENCOUNTER — Other Ambulatory Visit: Payer: Self-pay | Admitting: Family

## 2024-05-07 ENCOUNTER — Other Ambulatory Visit (HOSPITAL_COMMUNITY): Payer: Self-pay

## 2024-05-07 DIAGNOSIS — B20 Human immunodeficiency virus [HIV] disease: Secondary | ICD-10-CM

## 2024-05-07 MED ORDER — BIKTARVY 50-200-25 MG PO TABS
1.0000 | ORAL_TABLET | Freq: Every day | ORAL | 2 refills | Status: DC
  Filled 2024-05-07: qty 30, 30d supply, fill #0
  Filled 2024-05-31 (×2): qty 30, 30d supply, fill #1
  Filled 2024-06-28 – 2024-06-29 (×2): qty 30, 30d supply, fill #2

## 2024-05-11 ENCOUNTER — Encounter: Payer: Self-pay | Admitting: Physical Therapy

## 2024-05-11 ENCOUNTER — Other Ambulatory Visit: Payer: Self-pay

## 2024-05-11 ENCOUNTER — Ambulatory Visit: Admitting: Physical Therapy

## 2024-05-11 DIAGNOSIS — R262 Difficulty in walking, not elsewhere classified: Secondary | ICD-10-CM

## 2024-05-11 DIAGNOSIS — M6281 Muscle weakness (generalized): Secondary | ICD-10-CM

## 2024-05-11 DIAGNOSIS — R29898 Other symptoms and signs involving the musculoskeletal system: Secondary | ICD-10-CM

## 2024-05-11 DIAGNOSIS — R252 Cramp and spasm: Secondary | ICD-10-CM

## 2024-05-11 DIAGNOSIS — C8378 Burkitt lymphoma, lymph nodes of multiple sites: Secondary | ICD-10-CM

## 2024-05-11 DIAGNOSIS — R296 Repeated falls: Secondary | ICD-10-CM

## 2024-05-11 NOTE — Therapy (Signed)
 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)    HIV infection (HCC)    Internal hemorrhoids    Obesity    Rectal ulcer    Past Surgical History:  Procedure Laterality Date   IR IMAGING GUIDED PORT INSERTION  11/18/2022   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   Neutropenia (HCC) 06/26/2023   Rash 06/26/2023   Intractable back pain suspect Granix-related 02/21/2023   Lactic acidosis 02/21/2023   Thrombocytopenia (HCC) 02/01/2023   Hyponatremia 01/31/2023   Hypokalemia 01/23/2023   Hematuria 01/11/2023   Neutropenic fever (HCC) 01/10/2023   Pancytopenia (HCC) 01/10/2023   Acute cystitis 01/10/2023   Anemia 01/01/2023   Healthcare maintenance 12/19/2022   Encounter for chemotherapy management 12/12/2022   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HCC) 12/10/2022   Decreased appetite 12/02/2022   Drug-induced constipation 11/21/2022   Nausea without vomiting 11/21/2022   Acute nonintractable headache 11/21/2022   Encounter for antineoplastic chemotherapy 11/18/2022   Burkitt lymphoma of lymph nodes of multiple regions [facility]) 11/14/2022   Counseling regarding advance care planning and goals of care 11/14/2022   Chest mass 11/10/2022   Exposure to chlamydia 10/24/2022   Submental lymphadenopathy 09/05/2022   Goals of care, counseling/discussion 07/26/2022   History of  syphilis 07/26/2022   Neck swelling 07/26/2022   AIDS (acquired immune deficiency syndrome) (HCC) 07/25/2022    PCP: Frankie Israel, MD  REFERRING PROVIDER: Frankie Israel, MD  REFERRING DIAG: C83.78 (ICD-10-CM) - Burkitt lymphoma of lymph nodes of multiple regions [facility])  THERAPY DIAG:  Muscle weakness (generalized)  Abnormality of gait and mobility  Rationale for Evaluation and Treatment: Rehabilitation  ONSET DATE: 6-7 months  SUBJECTIVE:  SUBJECTIVE STATEMENT: Patient reports he is doing good today. He want to focus on the leg press and strengthening his legs.  Eval: Rt leg weakness and gives out sometimes, cramps throughout the night and sometimes while walking. Uses SPC and this helps a lot with prevention of falling when it gives out.  No pattern with walking or standing time with weakness.  4-5 falls. Does have numbness and tingling in both legs throughout bil thighs Did finish cancer treatments last year, does still port. Chemo completed. Wasn't very active during chemo with exhausted and feels so weak now.    PAIN: 05/11/2024 Are you having pain? Yes NPRS scale: 3/10 Pain location: bil thighs  Pain type: aching and dull does report sharp with cramping at night Pain description: constant   Aggravating factors: cramping at night is highest pain; walking, standing Relieving factors: sitting to rest  PRECAUTIONS: Other: recent cancer   RED FLAGS: None   WEIGHT BEARING RESTRICTIONS: No  FALLS:  Has patient fallen in last 6 months? Yes. Number of falls 5 (most recent Sunday)  03/23/24: No falls in the past 4 weeks  LIVING ENVIRONMENT: Lives with: lives with their family Lives in: House/apartment Stairs: No Has following equipment at home: Single point cane,  FWW  OCCUPATION: off work right now, CNA  PLOF: Independent  PATIENT GOALS: to feel stronger and get back to how I was before cancer  PERTINENT HISTORY:  HIV/AIDs; syphilis, rapidly growing right cervical nodal mass extending into the level 2 nodal station of the right supraclavicular nodal station and extension into the superficial right sternocleidomastoid muscle.  There is also hypermetabolic right axillary nodal disease.diagnosed in November/December 2023 Sexual abuse: NO  NEXT MD VISIT: 04/05/24 - oncologist  OBJECTIVE:  Note: Objective measures were completed at Evaluation unless otherwise noted.  DIAGNOSTIC FINDINGS:   Xray - hip 01/27/24 Mild degenerative changes to the hip joint  CT NECK WITH CONTRAST 12/28/23  IMPRESSION: 1. Resolution of the large indistinctly marginated mass lesion throughout the anterior lower neck, previously more extensive on the right than the left. Mild scarring of the right sternocleidomastoid muscle but no evidence of recurrent mass lesion. 2. Stable appearance of level 1 lymph nodes, the largest showing short axis dimension of 1 cm. Previously seen mass/adenopathy in the right level 2 region has resolved without residual. Other cervical chain lymph nodes on both sides of the neck are within normal limits in size.   PATIENT SURVEYS:  Initial eval: LEFS Lower Extremity Functional Score: 22 / 80 = 27.5 % indicative of moderate functional limitation   03/23/24: LEFS Lower Extremity Functional Score: 12 / 80 = 15 % indicative of severe functional limitation  (Patient explains he paid more attention to the details of the questions)  04/20/2024 LEFS: 46/80 57.5%  COGNITION: Overall cognitive status: Within functional limits for tasks assessed     SENSATION: Numbness in both thighs worse with pain, can feel pressure but light touch   POSTURE: rounded shoulders and anterior pelvic tilt  PALPATION: TTP at Rt greater trochanter, tightness  noted in Rt hip flexor, Rt lumbar paraspinal  LOWER EXTREMITY ROM:  WFL without pain for full flexion however at end range extension pt reports pain at proximal lateral hip on Rt  LOWER EXTREMITY MMT:  MMT Right Eval */5 Right  03/23/24 Left eval Left 03/23/24  Hip flexion 3 3 4+ 4+  Hip extension 3+ 3+ 4+ 4+  Hip abduction 3+ 4+ 4 5  Hip adduction 3+ 4+  4+ 4+  Hip internal rotation      Hip external rotation      Knee flexion 3+ 4 4 4+  Knee extension 3 3+ 4 4+  Ankle dorsiflexion 3+ 4- 4+ 4+  Ankle plantarflexion      Ankle inversion      Ankle eversion       (Blank rows = not tested)   FUNCTIONAL TESTS:  Initial eval: 5 times sit to stand: 24.06s with use of hands and felt 7/10 on BORG with this  03/23/24: 5 times sit to stand: 17.85 s with use of hands and felt 3/10 on BORG with this  GAIT: Distance walked: 271' (completed last session) Assistive device utilized: Single point cane Level of assistance: Modified independence Comments: greatly decreased cadence, decreased step length and height at Rt leg                                                                                                                                TREATMENT DATE:  05/11/24 Nustep x 5 min level 8 Leg Press (seat 9) 90# x 20 Stair negotiation (step to pattern with bilateral UE support) x 4 laps Lunge to BOSU in // bars with bilateral UE support x 10 each LE Squats 2 x 10 in // bars [employer] with green TB x 20 each direction Step ups + knee drive on airex with 16# unilateral KB hold x 20 bilateral      05/06/24 Nustep x 5 min level 8 Lateral band walks x 5 laps in // bars (red tband tied) Fwd/back walking in // bars x 5 laps Step up 2 x 10 each LE fwd in // bars Lateral step overs x 20 (10 each side) in //  bars Seated LAQ emphasizing terminal extension with 6 lb ankle weight 2 x 10 Marching x 20 with 6 lbs Seated clam with blue tband 2 x 10 Sit to stand 2 x 10 Lunge to BOSU  in // bars with bilateral UE support x 10 each LE Squats 2 x 10 in // bars  05/04/24 Nustep x 5 min level 5 Step up 2 x 10 each LE fwd in // bars Lateral step overs x 20 (10 each side) in //  bars Squats 2 x 10 in // bars Seated LAQ emphasizing terminal extension with 6 lb ankle weight 2 x 10 Marching x 20 with 6 lbs Sit to stand 2 x 10 Seated clam with blue tband 2 x 10    PATIENT EDUCATION:  Education details: [email or web address] Person educated: Patient Education method: Explanation, Demonstration, Tactile cues, Verbal cues, and Handouts Education comprehension: verbalized understanding, returned demonstration, verbal cues required, tactile cues required, and needs further education  HOME EXERCISE PROGRAM: Access Code: [email or web address] URL: [email or web address] Date: 02/03/2024 Prepared by: Concha Deed  Exercises - Supine March  - 1 x daily - 7 x weekly - 2 sets - 10 reps - Bent Knee Fallouts  -  1 x daily - 7 x weekly - 2 sets - 10 reps - Supine Hip Adductor Squeeze with Small Ball  - 1 x daily - 7 x weekly - 2 sets - 10 reps - Sit to Stand  - 3 x daily - 7 x weekly - 1-2 sets - 5-10 reps - Hooklying Clamshell with Resistance  - 1 x daily - 3 x weekly - 2 sets - 10 reps  ASSESSMENT:  CLINICAL IMPRESSION: Hyman demonstrates improved motivation to do higher level exercises. He was able to complete all exercises without needing a seated rest break. He required verbal and visual cues for correct exercise performance. Educated patient on the importance of mobility throughout the day and not just sitting in his recliner. Patient will benefit from skilled PT to address the below impairments and improve overall function.    Eval: Patient is a 43 y.o. male  who was seen today for physical therapy evaluation and treatment for Rt leg weakness. Pt has complex history of Burkitt lymphoma with chemo treatments now completed. Pt reports he was exhausted during his treatments and feels like he is  very weak and fatigued now. Does have numbness in bil thighs only posterior and anteriorly and feels like his Rt leg "gives out" to the point of him having 5 falls in the last several months. Pt does demonstrate gait deficits, very slow cadence and heavy trunk lean onto [facility] which pt uses on RT hand reporting he has more able to catch himself with the cane him this hand if his knee "gives". Pt found to have decreased strength bil LE but worse in Rt leg. Pt demonstrated high fall risk based on 5xSTS and after completing this was very fatigued and needed several minutes to recover stating 7/10 on BORG scale. LEFS score 22 / 80. 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   OBJECTIVE IMPAIRMENTS: Abnormal gait, decreased activity tolerance, decreased balance, decreased coordination, decreased endurance, decreased knowledge of use of DME, decreased mobility, difficulty walking, decreased strength, increased fascial restrictions, impaired perceived functional ability, increased muscle spasms, impaired flexibility, impaired sensation, improper body mechanics, postural dysfunction, and pain.   ACTIVITY LIMITATIONS: carrying, lifting, bending, sitting, standing, squatting, stairs, transfers, bed mobility, and locomotion level  PARTICIPATION LIMITATIONS: cleaning, laundry, interpersonal relationship, driving, shopping, community activity, occupation, and yard work  PERSONAL FACTORS: Fitness, Time since onset of injury/illness/exacerbation, and 1 comorbidity: medical history  are also affecting patient's functional outcome.   REHAB POTENTIAL: Good  CLINICAL DECISION MAKING: Evolving/moderate complexity  EVALUATION COMPLEXITY: Moderate   GOALS: Goals reviewed with patient? Yes  SHORT TERM GOALS: Target date: 02/25/24 Pt to be I with HEP.  Baseline: Goal status: In progress (compliance is poor)  2.  Pt to report improved  LEFS score of 30 to improve toward lowering functional impairment limitations.  Baseline:  Goal status: MET 04/20/2024  3.  Pt to demonstrate ability to complete body weight squats without hands for 10 reps to improve LE strength for tolerance for walking.  Baseline:  Goal status: MET 03/23/24  4.  Pt to demonstrate improved gait mechanics with even step length and height to be able to decreased fall risk with community ambulation.   Baseline:  Goal status: MET 03/23/24   LONG TERM GOALS: Target date: 05/18/24  Pt to be I with advanced HEP.  Baseline:  Goal status: In Progress  2.  Pt to demonstrate 10# squats without compensatory strategies for improved hip strength for tolerance to return to work Baseline:  Goal status: In Progress  3.  Pt to report ability to walk with or without AD for at least 30 mins with no increase in pain for tolerance to return to work.  Baseline:  Goal status: In Progress  4.  Pt to demonstrate 5xSTS in 7s to achieve age related norm for decreased fall risk Baseline:  Goal status: In Progress  5.  Pt to demonstrate at least 4+/5 bil hip strength for improved pelvic stability and functional squats without leakage.  Baseline:  Goal status: In Progress   PLAN:  PT FREQUENCY: 2x/week  PT DURATION: 8 weeks  PLANNED INTERVENTIONS: 97110-Therapeutic exercises, 97530- Therapeutic activity, 97112- Neuromuscular re-education, [phone]- Self Care, 91478- Manual therapy, [phone]- Gait training, [phone]- Aquatic Therapy, [phone]- Electrical stimulation (manual), [id number]- Vasopneumatic device, [id number]- Ultrasound, [id number]- Ionotophoresis 4mg /ml Dexamethasone , Patient/Family education, Balance training, Stair training, Taping, Dry Needling, Joint mobilization, Spinal mobilization, Scar mobilization, DME instructions, Cryotherapy, Moist heat, and Biofeedback  PLAN FOR NEXT SESSION: (getting close to end of cert) continue LE strengthening ;standing balance, core strengthening  Penelope Bowie, PT 05/11/24 1:53 PM [facility] [address], [address] [place], [place] [address] Phone # [phone] Fax [phone]

## 2024-05-13 ENCOUNTER — Telehealth: Payer: Self-pay | Admitting: Physical Therapy

## 2024-05-13 ENCOUNTER — Ambulatory Visit: Admitting: Physical Therapy
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
## 2024-05-13 NOTE — Telephone Encounter (Signed)
 Left patient a message about missed appointment. Reminded patient of next scheduled appointment 6/3 11am and left a call back number to the clinic.  Penelope Bowie, PT 05/13/24 11:27 AM

## 2024-05-14 ENCOUNTER — Inpatient Hospital Stay: Attending: Hematology | Admitting: Hematology

## 2024-05-14 ENCOUNTER — Inpatient Hospital Stay

## 2024-05-18 ENCOUNTER — Ambulatory Visit: Attending: Hematology

## 2024-05-18 DIAGNOSIS — M6281 Muscle weakness (generalized): Secondary | ICD-10-CM | POA: Insufficient documentation

## 2024-05-18 DIAGNOSIS — C8378 Burkitt lymphoma, lymph nodes of multiple sites: Secondary | ICD-10-CM | POA: Insufficient documentation

## 2024-05-18 DIAGNOSIS — R296 Repeated falls: Secondary | ICD-10-CM | POA: Diagnosis present

## 2024-05-18 DIAGNOSIS — R252 Cramp and spasm: Secondary | ICD-10-CM | POA: Insufficient documentation

## 2024-05-18 DIAGNOSIS — R293 Abnormal posture: Secondary | ICD-10-CM | POA: Diagnosis present

## 2024-05-18 DIAGNOSIS — R262 Difficulty in walking, not elsewhere classified: Secondary | ICD-10-CM | POA: Diagnosis present

## 2024-05-18 DIAGNOSIS — R29898 Other symptoms and signs involving the musculoskeletal system: Secondary | ICD-10-CM | POA: Diagnosis present

## 2024-05-18 NOTE — Therapy (Signed)
 OUTPATIENT PHYSICAL THERAPY LOWER EXTREMITY TREATMENT/RECERT Progress Note Reporting Period 04/05/24 to 05/18/24  See note below for Objective Data and Assessment of Progress/Goals.       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)    HIV infection (HCC)    Internal hemorrhoids    Obesity    Rectal ulcer    Past Surgical History:  Procedure Laterality Date   IR IMAGING GUIDED PORT INSERTION  11/18/2022   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   Neutropenia (HCC) 06/26/2023   Rash 06/26/2023   Intractable back pain suspect Granix-related 02/21/2023   Lactic acidosis 02/21/2023   Thrombocytopenia (HCC) 02/01/2023   Hyponatremia 01/31/2023   Hypokalemia 01/23/2023   Hematuria 01/11/2023   Neutropenic fever (HCC) 01/10/2023   Pancytopenia (HCC) 01/10/2023   Acute cystitis 01/10/2023   Anemia 01/01/2023   Healthcare maintenance 12/19/2022   Encounter for chemotherapy management 12/12/2022   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HCC) 12/10/2022   Decreased appetite 12/02/2022   Drug-induced constipation 11/21/2022   Nausea without vomiting 11/21/2022   Acute nonintractable headache 11/21/2022   Encounter for antineoplastic chemotherapy 11/18/2022   Burkitt lymphoma of lymph nodes of multiple regions [facility]) 11/14/2022   Counseling regarding advance care planning and goals of care 11/14/2022   Chest mass 11/10/2022    Exposure to chlamydia 10/24/2022   Submental lymphadenopathy 09/05/2022   Goals of care, counseling/discussion 07/26/2022   History of syphilis 07/26/2022   Neck swelling 07/26/2022   AIDS (acquired immune deficiency syndrome) (HCC) 07/25/2022    PCP: Frankie Israel, MD  REFERRING PROVIDER: Frankie Israel, MD  REFERRING DIAG: C83.78 (ICD-10-CM) - Burkitt lymphoma of lymph nodes of multiple regions [facility])  THERAPY DIAG:  Muscle weakness (generalized)  Abnormality of gait and mobility  Rationale for Evaluation and Treatment: Rehabilitation  ONSET DATE: 6-7 months  SUBJECTIVE:  SUBJECTIVE STATEMENT: Patient reports he is feeling ok today. Pain level at about a 5/10.  "I'm just really tired".  He is hoping to work toward return to work strengthening activities in the next few weeks and hoping to continue formal PT to do this.     Eval: Rt leg weakness and gives out sometimes, cramps throughout the night and sometimes while walking. Uses SPC and this helps a lot with prevention of falling when it gives out.  No pattern with walking or standing time with weakness.  4-5 falls. Does have numbness and tingling in both legs throughout bil thighs Did finish cancer treatments last year, does still port. Chemo completed. Wasn't very active during chemo with exhausted and feels so weak now.    PAIN: 05/18/2024 Are you having pain? Yes NPRS scale: 3/10 Pain location: bil thighs  Pain type: aching and dull does report sharp with cramping at night Pain description: constant   Aggravating factors: cramping at night is highest pain; walking, standing Relieving factors: sitting to rest  PRECAUTIONS: Other: recent cancer   RED FLAGS: None   WEIGHT BEARING RESTRICTIONS: No  FALLS:  Has patient  fallen in last 6 months? Yes. Number of falls 5 (most recent Sunday)  03/23/24: No falls in the past 4 weeks  LIVING ENVIRONMENT: Lives with: lives with their family Lives in: House/apartment Stairs: No Has following equipment at home: Single point cane, FWW  OCCUPATION: off work right now, CNA  PLOF: Independent  PATIENT GOALS: to feel stronger and get back to how I was before cancer  PERTINENT HISTORY:  HIV/AIDs; syphilis, rapidly growing right cervical nodal mass extending into the level 2 nodal station of the right supraclavicular nodal station and extension into the superficial right sternocleidomastoid muscle.  There is also hypermetabolic right axillary nodal disease.diagnosed in November/December 2023 Sexual abuse: NO  NEXT MD VISIT: 04/05/24 - oncologist  OBJECTIVE:  Note: Objective measures were completed at Evaluation unless otherwise noted.  DIAGNOSTIC FINDINGS:   Xray - hip 01/27/24 Mild degenerative changes to the hip joint  CT NECK WITH CONTRAST 12/28/23  IMPRESSION: 1. Resolution of the large indistinctly marginated mass lesion throughout the anterior lower neck, previously more extensive on the right than the left. Mild scarring of the right sternocleidomastoid muscle but no evidence of recurrent mass lesion. 2. Stable appearance of level 1 lymph nodes, the largest showing short axis dimension of 1 cm. Previously seen mass/adenopathy in the right level 2 region has resolved without residual. Other cervical chain lymph nodes on both sides of the neck are within normal limits in size.   PATIENT SURVEYS:  Initial eval: LEFS Lower Extremity Functional Score: 22 / 80 = 27.5 % indicative of moderate functional limitation   03/23/24: LEFS Lower Extremity Functional Score: 12 / 80 = 15 % indicative of severe functional limitation  (Patient explains he paid more attention to the details of the questions)  04/20/2024 LEFS: 47/80= 58.8%  COGNITION: Overall  cognitive status: Within functional limits for tasks assessed     SENSATION: Numbness in both thighs worse with pain, can feel pressure but light touch   POSTURE: rounded shoulders and anterior pelvic tilt  PALPATION: TTP at Rt greater trochanter, tightness noted in Rt hip flexor, Rt lumbar paraspinal  LOWER EXTREMITY ROM:  WFL without pain for full flexion however at end range extension pt reports pain at proximal lateral hip on Rt  LOWER EXTREMITY MMT:  MMT Right Eval */5 Right  03/23/24 Right 05/18/24  Left eval Left 03/23/24 Left  05/18/24  Hip flexion 3 3 3+ 4+ 4+ 4+  Hip extension 3+ 3+ 4 4+ 4+ 4+  Hip abduction 3+ 4+ 4+ 4 5 5   Hip adduction 3+ 4+ 4+ 4+ 4+ 4+  Hip internal rotation        Hip external rotation        Knee flexion 3+ 4 4+ 4 4+ 4+  Knee extension 3 3+ 4- 4 4+ 4+  Ankle dorsiflexion 3+ 4- 4 4+ 4+ 4+  Ankle plantarflexion        Ankle inversion        Ankle eversion         (Blank rows = not tested)   FUNCTIONAL TESTS:  Initial eval: 5 times sit to stand: 24.06s with use of hands and felt 7/10 on BORG with this  03/23/24: 5 times sit to stand: 17.85 s with use of hands and felt 3/10 on BORG with this  05/18/24: 5 times sit to stand: 12.87 s with use of hands and felt 3/10 on BORG with this 3 MWT: 294.1 ft   GAIT: Distance walked: 271' (completed last session) Assistive device utilized: Single point cane Level of assistance: Modified independence Comments: greatly decreased cadence, decreased step length and height at Rt leg                                                                                                                                TREATMENT DATE:  05/18/24 Nustep x 8 min level 5 Re-assessment completed Sit to stand 2 x 10 Seated LAQ 2 x 10 each LE with 5 lbs Gait training with SPC during 3 MWT Functional work simulation training using wheelchair and pushing 200 lbs on level surface x 200 feet  05/11/24 Nustep x 5 min level  8 Leg Press (seat 9) 90# x 20 Stair negotiation (step to pattern with bilateral UE support) x 4 laps Lunge to BOSU in // bars with bilateral UE support x 10 each LE Squats 2 x 10 in // bars [employer] with green TB x 20 each direction Step ups + knee drive on airex with 16# unilateral KB hold x 20 bilateral   05/06/24 Nustep x 5 min level 8 Lateral band walks x 5 laps in // bars (red tband tied) Fwd/back walking in // bars x 5 laps Step up 2 x 10 each LE fwd in // bars Lateral step overs x 20 (10 each side) in //  bars Seated LAQ emphasizing terminal extension with 6 lb ankle weight 2 x 10 Marching x 20 with 6 lbs Seated clam with blue tband 2 x 10 Sit to stand 2 x 10 Lunge to BOSU in // bars with bilateral UE support x 10 each LE Squats 2 x 10 in // bars  PATIENT EDUCATION:  Education details: [email or web address] Person educated: Patient Education method: Explanation, Demonstration, Tactile cues, Verbal cues, and  Handouts Education comprehension: verbalized understanding, returned demonstration, verbal cues required, tactile cues required, and needs further education  HOME EXERCISE PROGRAM: Access Code: [email or web address] URL: [email or web address] Date: 02/03/2024 Prepared by: Concha Deed  Exercises - Supine March  - 1 x daily - 7 x weekly - 2 sets - 10 reps - Bent Knee Fallouts  - 1 x daily - 7 x weekly - 2 sets - 10 reps - Supine Hip Adductor Squeeze with Small Ball  - 1 x daily - 7 x weekly - 2 sets - 10 reps - Sit to Stand  - 3 x daily - 7 x weekly - 1-2 sets - 5-10 reps - Hooklying Clamshell with Resistance  - 1 x daily - 3 x weekly - 2 sets - 10 reps  ASSESSMENT:  CLINICAL IMPRESSION: Logan Ward is beginning to make excellent progress.  He tolerates more time on his feet.  He is able to get out more and is working on increasing his walking distance.  His strength and overall objective findings are improved from last re-assessment.  He seems better motivated in the past few weeks  and is less fearful of falling.  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  We will go ahead and request recertification for 8 more weeks to prepare patient for resuming his level of work.     Eval: Patient is a 43 y.o. male  who was seen today for physical therapy evaluation and treatment for Rt leg weakness. Pt has complex history of Burkitt lymphoma with chemo treatments now completed. Pt reports he was exhausted during his treatments and feels like he is very weak and fatigued now. Does have numbness in bil thighs only posterior and anteriorly and feels like his Rt leg "gives out" to the point of him having 5 falls in the last several months. Pt does demonstrate gait deficits, very slow cadence and heavy trunk lean onto [facility] which pt uses on RT hand reporting he has more able to catch himself with the cane him this hand if his knee "gives". Pt found to have decreased strength bil LE but worse in Rt leg. Pt demonstrated high fall risk based on 5xSTS and after completing this was very fatigued and needed several minutes to recover stating 7/10 on BORG scale. LEFS score 22 / 80. 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   OBJECTIVE IMPAIRMENTS: Abnormal gait, decreased activity tolerance, decreased balance, decreased coordination, decreased endurance, decreased knowledge of use of DME, decreased mobility, difficulty walking, decreased strength, increased fascial restrictions, impaired perceived functional ability, increased muscle spasms, impaired flexibility, impaired sensation, improper body mechanics, postural dysfunction, and pain.   ACTIVITY LIMITATIONS: carrying, lifting, bending, sitting, standing, squatting, stairs, transfers, bed mobility, and locomotion level  PARTICIPATION LIMITATIONS: cleaning, laundry,  interpersonal relationship, driving, shopping, community activity, occupation, and yard work  PERSONAL FACTORS: Fitness, Time since onset of injury/illness/exacerbation, and 1 comorbidity: medical history  are also affecting patient's functional outcome.   REHAB POTENTIAL: Good  CLINICAL DECISION MAKING: Evolving/moderate complexity  EVALUATION COMPLEXITY: Moderate   GOALS: Goals reviewed with patient? Yes  SHORT TERM GOALS: Target date: 02/25/24 Pt to be I with HEP.  Baseline: Goal status: In progress (compliance is poor)  2.  Pt to report improved LEFS score of 30 to  improve toward lowering functional impairment limitations.  Baseline:  Goal status: MET 04/20/2024  3.  Pt to demonstrate ability to complete body weight squats without hands for 10 reps to improve LE strength for tolerance for walking.  Baseline:  Goal status: MET 03/23/24  4.  Pt to demonstrate improved gait mechanics with even step length and height to be able to decreased fall risk with community ambulation.   Baseline:  Goal status: MET 03/23/24   LONG TERM GOALS: Target date: 05/18/24  Pt to be I with advanced HEP.  Baseline:  Goal status: MET 05/18/24  2.  Pt to demonstrate 10# squats without compensatory strategies for improved hip strength for tolerance to return to work Baseline:  Goal status: In Progress  3.  Pt to report ability to walk with or without AD for at least 30 mins with no increase in pain for tolerance to return to work.  Baseline:  Goal status: In Progress  4.  Pt to demonstrate 5xSTS in 7s to achieve age related norm for decreased fall risk Baseline:  Goal status: In Progress  5.  Pt to demonstrate at least 4+/5 bil hip strength for improved pelvic stability and functional squats without leakage.  Baseline:  Goal status: In Progress   PLAN:  PT FREQUENCY: 2x/week  PT DURATION: 8 weeks  PLANNED INTERVENTIONS: 97110-Therapeutic exercises, 97530- Therapeutic activity, [id number]-  Neuromuscular re-education, 97535- Self Care, [id number]- Manual therapy, [phone]- Gait training, [id number]- Aquatic Therapy, [phone]- Electrical stimulation (manual), [id number]- Vasopneumatic device, [id number]- Ultrasound, [id number]- Ionotophoresis 4mg /ml Dexamethasone , Patient/Family education, Balance training, Stair training, Taping, Dry Needling, Joint mobilization, Spinal mobilization, Scar mobilization, DME instructions, Cryotherapy, Moist heat, and Biofeedback  PLAN FOR NEXT SESSION: Recert completed.  Continue LE strengthening ;standing balance, core strengthening  Neema Barreira B. Xianna Siverling, PT 05/18/24 12:53 PM [facility] [address], [address] [place], [place] [address] Phone # [phone] Fax [phone]

## 2024-05-19 ENCOUNTER — Other Ambulatory Visit: Payer: Self-pay

## 2024-05-27 ENCOUNTER — Other Ambulatory Visit: Payer: Self-pay

## 2024-05-31 ENCOUNTER — Other Ambulatory Visit: Payer: Self-pay

## 2024-05-31 NOTE — Progress Notes (Signed)
 Specialty Pharmacy Refill Coordination Note  Logan Ward is a 43 y.o. male contacted today regarding refills of specialty medication(s) Bictegravir-Emtricitab-Tenofov (Biktarvy )   Patient requested Delivery   Delivery date: 06/08/24   Verified address: [address] [place] [address]   Medication will be filled on 06/07/24.

## 2024-06-01 ENCOUNTER — Ambulatory Visit

## 2024-06-01 DIAGNOSIS — R262 Difficulty in walking, not elsewhere classified: Secondary | ICD-10-CM

## 2024-06-01 DIAGNOSIS — R296 Repeated falls: Secondary | ICD-10-CM

## 2024-06-01 DIAGNOSIS — R29898 Other symptoms and signs involving the musculoskeletal system: Secondary | ICD-10-CM

## 2024-06-01 DIAGNOSIS — M6281 Muscle weakness (generalized): Secondary | ICD-10-CM

## 2024-06-01 DIAGNOSIS — R252 Cramp and spasm: Secondary | ICD-10-CM

## 2024-06-01 DIAGNOSIS — R293 Abnormal posture: Secondary | ICD-10-CM

## 2024-06-01 NOTE — Progress Notes (Signed)
 Received Attending Physician Statement from [facility] for completion by Provider. Patient has been a No-Show for last 2 office appointments and Provider declines request to complete Attending Physician Statement at this time due to need for re-evaluation and follow-up. Patient was referred to Physical Therapy for complaints of weakness in  right leg and is participating in therapy. Request for medical records forwarded to [facility] Health Information Management Office with signed Release of Information Form. Collaborative Nurse to notify Patient of need for follow-up appointment.

## 2024-06-01 NOTE — Therapy (Signed)
 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)    HIV infection (HCC)    Internal hemorrhoids    Obesity    Rectal ulcer    Past Surgical History:  Procedure Laterality Date   IR IMAGING GUIDED PORT INSERTION  11/18/2022   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   Neutropenia (HCC) 06/26/2023   Rash 06/26/2023   Intractable back pain suspect Granix-related 02/21/2023   Lactic acidosis 02/21/2023   Thrombocytopenia (HCC) 02/01/2023   Hyponatremia 01/31/2023   Hypokalemia 01/23/2023   Hematuria 01/11/2023   Neutropenic fever (HCC) 01/10/2023   Pancytopenia (HCC) 01/10/2023   Acute cystitis 01/10/2023   Anemia 01/01/2023   Healthcare maintenance 12/19/2022   Encounter for chemotherapy management 12/12/2022   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HCC) 12/10/2022   Decreased appetite 12/02/2022   Drug-induced constipation 11/21/2022   Nausea without vomiting 11/21/2022   Acute nonintractable headache 11/21/2022   Encounter for antineoplastic chemotherapy 11/18/2022   Burkitt lymphoma of lymph nodes of multiple regions [facility]) 11/14/2022   Counseling regarding advance care planning and goals of care 11/14/2022   Chest mass 11/10/2022   Exposure to chlamydia 10/24/2022   Submental lymphadenopathy 09/05/2022   Goals of care, counseling/discussion 07/26/2022    History of syphilis 07/26/2022   Neck swelling 07/26/2022   AIDS (acquired immune deficiency syndrome) (HCC) 07/25/2022    PCP: Frankie Israel, MD  REFERRING PROVIDER: Frankie Israel, MD  REFERRING DIAG: C83.78 (ICD-10-CM) - Burkitt lymphoma of lymph nodes of multiple regions [facility])  THERAPY DIAG:  Muscle weakness (generalized)  Abnormality of gait and mobility  Rationale for Evaluation and Treatment: Rehabilitation  ONSET DATE: 6-7 months  SUBJECTIVE:  SUBJECTIVE STATEMENT: Patient reports he is feeling ok today. Pain level at about a 6/10.    Eval: Rt leg weakness and gives out sometimes, cramps throughout the night and sometimes while walking. Uses SPC and this helps a lot with prevention of falling when it gives out.  No pattern with walking or standing time with weakness.  4-5 falls. Does have numbness and tingling in both legs throughout bil thighs Did finish cancer treatments last year, does still port. Chemo completed. Wasn't very active during chemo with exhausted and feels so weak now.    PAIN: 06/01/2024 Are you having pain? Yes NPRS scale: 6/10 Pain location: bil thighs  Pain type: aching and dull does report sharp with cramping at night Pain description: constant   Aggravating factors: cramping at night is highest pain; walking, standing Relieving factors: sitting to rest  PRECAUTIONS: Other: recent cancer   RED FLAGS: None   WEIGHT BEARING RESTRICTIONS: No  FALLS:  Has patient fallen in last 6 months? Yes. Number of falls 5 (most recent Sunday)  03/23/24: No falls in the past 4 weeks  LIVING ENVIRONMENT: Lives with: lives with their family Lives in: House/apartment Stairs: No Has following equipment at home: Single point cane, FWW  OCCUPATION: off  work right now, CNA  PLOF: Independent  PATIENT GOALS: to feel stronger and get back to how I was before cancer  PERTINENT HISTORY:  HIV/AIDs; syphilis, rapidly growing right cervical nodal mass extending into the level 2 nodal station of the right supraclavicular nodal station and extension into the superficial right sternocleidomastoid muscle.  There is also hypermetabolic right axillary nodal disease.diagnosed in November/December 2023 Sexual abuse: NO  NEXT MD VISIT: 04/05/24 - oncologist  OBJECTIVE:  Note: Objective measures were completed at Evaluation unless otherwise noted.  DIAGNOSTIC FINDINGS:   Xray - hip 01/27/24 Mild degenerative changes to the hip joint  CT NECK WITH CONTRAST 12/28/23  IMPRESSION: 1. Resolution of the large indistinctly marginated mass lesion throughout the anterior lower neck, previously more extensive on the right than the left. Mild scarring of the right sternocleidomastoid muscle but no evidence of recurrent mass lesion. 2. Stable appearance of level 1 lymph nodes, the largest showing short axis dimension of 1 cm. Previously seen mass/adenopathy in the right level 2 region has resolved without residual. Other cervical chain lymph nodes on both sides of the neck are within normal limits in size.   PATIENT SURVEYS:  Initial eval: LEFS Lower Extremity Functional Score: 22 / 80 = 27.5 % indicative of moderate functional limitation   03/23/24: LEFS Lower Extremity Functional Score: 12 / 80 = 15 % indicative of severe functional limitation  (Patient explains he paid more attention to the details of the questions)  04/20/2024 LEFS: 47/80= 58.8%  COGNITION: Overall cognitive status: Within functional limits for tasks assessed     SENSATION: Numbness in both thighs worse with pain, can feel pressure but light touch   POSTURE: rounded shoulders and anterior pelvic tilt  PALPATION: TTP at Rt greater trochanter, tightness noted in Rt hip flexor,  Rt lumbar paraspinal  LOWER EXTREMITY ROM:  WFL without pain for full flexion however at end range extension pt reports pain at proximal lateral hip on Rt  LOWER EXTREMITY MMT:  MMT Right Eval */5 Right  03/23/24 Right 05/18/24 Left eval Left 03/23/24 Left  05/18/24  Hip flexion 3 3 3+ 4+ 4+ 4+  Hip extension 3+ 3+ 4 4+ 4+ 4+  Hip abduction 3+ 4+ 4+ 4  5 5  Hip adduction 3+ 4+ 4+ 4+ 4+ 4+  Hip internal rotation        Hip external rotation        Knee flexion 3+ 4 4+ 4 4+ 4+  Knee extension 3 3+ 4- 4 4+ 4+  Ankle dorsiflexion 3+ 4- 4 4+ 4+ 4+  Ankle plantarflexion        Ankle inversion        Ankle eversion         (Blank rows = not tested)   FUNCTIONAL TESTS:  Initial eval: 5 times sit to stand: 24.06s with use of hands and felt 7/10 on BORG with this  03/23/24: 5 times sit to stand: 17.85 s with use of hands and felt 3/10 on BORG with this  05/18/24: 5 times sit to stand: 12.87 s with use of hands and felt 3/10 on BORG with this 3 MWT: 294.1 ft   GAIT: Distance walked: 271' (completed last session) Assistive device utilized: Single point cane Level of assistance: Modified independence Comments: greatly decreased cadence, decreased step length and height at Rt leg                                                                                                                                TREATMENT DATE:  06/01/24 Treadmill x 5 min level 1.5 mph Standing hip abduction 2 x 10 0# in // bars Standing hip extension 2 x 10 0# in // bars Squats 2 x 10 in // bars Seated LAQ 2 x 10 with 7 lb ankle weights Seated march with 7 lb ankle weights x 20 Sit to stand 2 x 10 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    05/18/24 Nustep x 8 min level 5 Re-assessment completed Sit to stand 2 x 10 Seated LAQ 2 x 10 each LE with 5 lbs Gait training with  SPC during 3 MWT Functional work simulation training using wheelchair and pushing 200 lbs on level surface x 200 feet  05/11/24 Nustep x 5 min level 8 Leg Press (seat 9) 90# x 20 Stair negotiation (step to pattern with bilateral UE support) x 4 laps Lunge to BOSU in // bars with bilateral UE support x 10 each LE Squats 2 x 10 in // bars Pallof Press with green TB x 20 each direction Step ups + knee drive on airex with 16# unilateral KB hold x 20 bilateral    PATIENT EDUCATION:  Education details: [email or web address] Person educated: Patient Education method: Explanation, Demonstration, Tactile cues, Verbal cues, and Handouts Education comprehension: verbalized understanding, returned demonstration, verbal cues required, tactile cues required, and needs further education  HOME EXERCISE PROGRAM: Access Code: [email or web address] URL: [email or web address] Date: 02/03/2024 Prepared by: Concha Deed  Exercises - Supine March  - 1 x daily - 7 x weekly - 2 sets -  10 reps - Bent Knee Fallouts  - 1 x daily - 7 x weekly - 2 sets - 10 reps - Supine Hip Adductor Squeeze with Small Ball  - 1 x daily - 7 x weekly - 2 sets - 10 reps - Sit to Stand  - 3 x daily - 7 x weekly - 1-2 sets - 5-10 reps - Hooklying Clamshell with Resistance  - 1 x daily - 3 x weekly - 2 sets - 10 reps  ASSESSMENT:  CLINICAL IMPRESSION: Danzel is progressing toward goals appropriately. He is tolerating increased speed and time on treadmill.  He was able to do 7 lbs on LAQ today.  He was able to push wheelchair to exit and enter building with 200 lbs plus weight of wc, navigating doorways without auto open and ramp.  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  He would benefit from continuing skilled PT for balance and LE strength.     Eval: Patient is a 43 y.o. male  who was seen today for physical therapy evaluation and treatment for Rt leg weakness. Pt has complex history of Burkitt lymphoma with chemo treatments now  completed. Pt reports he was exhausted during his treatments and feels like he is very weak and fatigued now. Does have numbness in bil thighs only posterior and anteriorly and feels like his Rt leg gives out to the point of him having 5 falls in the last several months. Pt does demonstrate gait deficits, very slow cadence and heavy trunk lean onto [facility] which pt uses on RT hand reporting he has more able to catch himself with the cane him this hand if his knee gives. Pt found to have decreased strength bil LE but worse in Rt leg. Pt demonstrated high fall risk based on 5xSTS and after completing this was very fatigued and needed several minutes to recover stating 7/10 on BORG scale. LEFS score 22 / 80. 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   OBJECTIVE IMPAIRMENTS: Abnormal gait, decreased activity tolerance, decreased balance, decreased coordination, decreased endurance, decreased knowledge of use of DME, decreased mobility, difficulty walking, decreased strength, increased fascial restrictions, impaired perceived functional ability, increased muscle spasms, impaired flexibility, impaired sensation, improper body mechanics, postural dysfunction, and pain.   ACTIVITY LIMITATIONS: carrying, lifting, bending, sitting, standing, squatting, stairs, transfers, bed mobility, and locomotion level  PARTICIPATION LIMITATIONS: cleaning, laundry, interpersonal relationship, driving, shopping, community activity, occupation, and yard work  PERSONAL FACTORS: Fitness, Time since onset of injury/illness/exacerbation, and 1 comorbidity: medical history  are also affecting patient's functional outcome.   REHAB POTENTIAL: Good  CLINICAL DECISION MAKING: Evolving/moderate complexity  EVALUATION COMPLEXITY: Moderate   GOALS: Goals reviewed with patient? Yes  SHORT TERM GOALS: Target date: 02/25/24 Pt to be I with HEP.   Baseline: Goal status: In progress (compliance is poor)  2.  Pt to report improved LEFS score of 30 to improve toward lowering functional impairment limitations.  Baseline:  Goal status: MET 04/20/2024  3.  Pt to demonstrate ability to complete body weight squats without hands for 10 reps to improve LE strength for tolerance for walking.  Baseline:  Goal status: MET 03/23/24  4.  Pt to demonstrate improved gait mechanics with even step length and height to be able to decreased fall risk with community ambulation.   Baseline:  Goal status: MET 03/23/24   LONG  TERM GOALS: Target date: 05/18/24  Pt to be I with advanced HEP.  Baseline:  Goal status: MET 05/18/24  2.  Pt to demonstrate 10# squats without compensatory strategies for improved hip strength for tolerance to return to work Baseline:  Goal status: In Progress  3.  Pt to report ability to walk with or without AD for at least 30 mins with no increase in pain for tolerance to return to work.  Baseline:  Goal status: In Progress  4.  Pt to demonstrate 5xSTS in 7s to achieve age related norm for decreased fall risk Baseline:  Goal status: In Progress  5.  Pt to demonstrate at least 4+/5 bil hip strength for improved pelvic stability and functional squats without leakage.  Baseline:  Goal status: In Progress   PLAN:  PT FREQUENCY: 2x/week  PT DURATION: 8 weeks  PLANNED INTERVENTIONS: 97110-Therapeutic exercises, 97530- Therapeutic activity, [id number]- Neuromuscular re-education, 97535- Self Care, [id number]- Manual therapy, [phone]- Gait training, [phone]- Aquatic Therapy, [phone]- Electrical stimulation (manual), [id number]- Vasopneumatic device, [id number]- Ultrasound, [id number]- Ionotophoresis 4mg /ml Dexamethasone , Patient/Family education, Balance training, Stair training, Taping, Dry Needling, Joint mobilization, Spinal mobilization, Scar mobilization, DME instructions, Cryotherapy, Moist heat, and Biofeedback  PLAN FOR NEXT SESSION:  Continue LE  strengthening ;standing balance, core strengthening  Fany Cavanaugh B. Devina Bezold, PT 06/01/24 2:49 PM [facility] [address], [address] [place], [place] [address] Phone # [phone] Fax [phone]

## 2024-06-07 ENCOUNTER — Other Ambulatory Visit: Payer: Self-pay

## 2024-06-08 ENCOUNTER — Inpatient Hospital Stay: Admitting: Hematology

## 2024-06-08 ENCOUNTER — Inpatient Hospital Stay: Attending: Hematology

## 2024-06-15 ENCOUNTER — Ambulatory Visit

## 2024-06-16 ENCOUNTER — Ambulatory Visit: Attending: Hematology | Admitting: Physical Therapy

## 2024-06-16 ENCOUNTER — Encounter: Payer: Self-pay | Admitting: Physical Therapy

## 2024-06-16 DIAGNOSIS — M6281 Muscle weakness (generalized): Secondary | ICD-10-CM | POA: Diagnosis present

## 2024-06-16 DIAGNOSIS — R296 Repeated falls: Secondary | ICD-10-CM | POA: Insufficient documentation

## 2024-06-16 DIAGNOSIS — R29898 Other symptoms and signs involving the musculoskeletal system: Secondary | ICD-10-CM | POA: Insufficient documentation

## 2024-06-16 DIAGNOSIS — R252 Cramp and spasm: Secondary | ICD-10-CM | POA: Diagnosis present

## 2024-06-16 DIAGNOSIS — R293 Abnormal posture: Secondary | ICD-10-CM | POA: Insufficient documentation

## 2024-06-16 DIAGNOSIS — R262 Difficulty in walking, not elsewhere classified: Secondary | ICD-10-CM | POA: Insufficient documentation

## 2024-06-16 NOTE — Therapy (Signed)
 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)    HIV infection (HCC)    Internal hemorrhoids    Obesity    Rectal ulcer    Past Surgical History:  Procedure Laterality Date   IR IMAGING GUIDED PORT INSERTION  11/18/2022   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   Neutropenia (HCC) 06/26/2023   Rash 06/26/2023   Intractable back pain suspect Granix-related 02/21/2023   Lactic acidosis 02/21/2023   Thrombocytopenia (HCC) 02/01/2023   Hyponatremia 01/31/2023   Hypokalemia 01/23/2023   Hematuria 01/11/2023   Neutropenic fever (HCC) 01/10/2023   Pancytopenia (HCC) 01/10/2023   Acute cystitis 01/10/2023   Anemia 01/01/2023   Healthcare maintenance 12/19/2022   Encounter for chemotherapy management 12/12/2022   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HCC) 12/10/2022   Decreased appetite 12/02/2022   Drug-induced constipation 11/21/2022   Nausea without vomiting 11/21/2022   Acute nonintractable headache 11/21/2022   Encounter for antineoplastic chemotherapy 11/18/2022   Burkitt lymphoma of lymph nodes of multiple regions [facility]) 11/14/2022   Counseling regarding advance care planning and goals of care 11/14/2022   Chest mass 11/10/2022   Exposure to chlamydia 10/24/2022   Submental lymphadenopathy 09/05/2022   Goals of care, counseling/discussion 07/26/2022    History of syphilis 07/26/2022   Neck swelling 07/26/2022   AIDS (acquired immune deficiency syndrome) (HCC) 07/25/2022    PCP: [name], MD  REFERRING PROVIDER: [name], MD  REFERRING DIAG: C83.78 (ICD-10-CM) - Burkitt lymphoma of lymph nodes of multiple regions [facility])  THERAPY DIAG:  Muscle weakness (generalized)  Abnormality of gait and mobility  Rationale for Evaluation and Treatment: Rehabilitation  ONSET DATE: 6-7 months  SUBJECTIVE:  SUBJECTIVE STATEMENT: Patient reports he is doing okay. Pain 6/10  Eval: Rt leg weakness and gives out sometimes, cramps throughout the night and sometimes while walking. Uses SPC and this helps a lot with prevention of falling when it gives out.  No pattern with walking or standing time with weakness.  4-5 falls. Does have numbness and tingling in both legs throughout bil thighs Did finish cancer treatments last year, does still port. Chemo completed. Wasn't very active during chemo with exhausted and feels so weak now.    PAIN: 06/16/2024 Are you having pain? Yes NPRS scale: 6/10 Pain location: bil thighs  Pain type: aching and dull does report sharp with cramping at night Pain description: constant   Aggravating factors: cramping at night is highest pain; walking, standing Relieving factors: sitting to rest  PRECAUTIONS: Other: recent cancer   RED FLAGS: None   WEIGHT BEARING RESTRICTIONS: No  FALLS:  Has patient fallen in last 6 months? Yes. Number of falls 5 (most recent Sunday)  03/23/24: No falls in the past 4 weeks  LIVING ENVIRONMENT: Lives with: lives with their family Lives in: House/apartment Stairs: No Has following equipment at home: Single point cane, FWW  OCCUPATION: off work right now, CNA  PLOF:  Independent  PATIENT GOALS: to feel stronger and get back to how I was before cancer  PERTINENT HISTORY:  HIV/AIDs; syphilis, rapidly growing right cervical nodal mass extending into the level 2 nodal station of the right supraclavicular nodal station and extension into the superficial right sternocleidomastoid muscle.  There is also hypermetabolic right axillary nodal disease.diagnosed in November/December 2023 Sexual abuse: NO  NEXT MD VISIT: 04/05/24 - oncologist  OBJECTIVE:  Note: Objective measures were completed at Evaluation unless otherwise noted.  DIAGNOSTIC FINDINGS:   Xray - hip 01/27/24 Mild degenerative changes to the hip joint  CT NECK WITH CONTRAST 12/28/23  IMPRESSION: 1. Resolution of the large indistinctly marginated mass lesion throughout the anterior lower neck, previously more extensive on the right than the left. Mild scarring of the right sternocleidomastoid muscle but no evidence of recurrent mass lesion. 2. Stable appearance of level 1 lymph nodes, the largest showing short axis dimension of 1 cm. Previously seen mass/adenopathy in the right level 2 region has resolved without residual. Other cervical chain lymph nodes on both sides of the neck are within normal limits in size.   PATIENT SURVEYS:  Initial eval: LEFS Lower Extremity Functional Score: 22 / 80 = 27.5 % indicative of moderate functional limitation   03/23/24: LEFS Lower Extremity Functional Score: 12 / 80 = 15 % indicative of severe functional limitation  (Patient explains he paid more attention to the details of the questions)  04/20/2024 LEFS: 47/80= 58.8%  COGNITION: Overall cognitive status: Within functional limits for tasks assessed     SENSATION: Numbness in both thighs worse with pain, can feel pressure but light touch   POSTURE: rounded shoulders and anterior pelvic tilt  PALPATION: TTP at Rt greater trochanter, tightness noted in Rt hip flexor, Rt lumbar paraspinal  LOWER  EXTREMITY ROM:  WFL without pain for full flexion however at end range extension pt reports pain at proximal lateral hip on Rt  LOWER EXTREMITY MMT:  MMT Right Eval */5 Right  03/23/24 Right 05/18/24 Left eval Left 03/23/24 Left  05/18/24  Hip flexion 3 3 3+ 4+ 4+ 4+  Hip extension 3+ 3+ 4 4+ 4+ 4+  Hip abduction 3+ 4+ 4+ 4 5 5   Hip adduction 3+ 4+  4+ 4+ 4+ 4+  Hip internal rotation        Hip external rotation        Knee flexion 3+ 4 4+ 4 4+ 4+  Knee extension 3 3+ 4- 4 4+ 4+  Ankle dorsiflexion 3+ 4- 4 4+ 4+ 4+  Ankle plantarflexion        Ankle inversion        Ankle eversion         (Blank rows = not tested)   FUNCTIONAL TESTS:  Initial eval: 5 times sit to stand: 24.06s with use of hands and felt 7/10 on BORG with this  03/23/24: 5 times sit to stand: 17.85 s with use of hands and felt 3/10 on BORG with this  05/18/24: 5 times sit to stand: 12.87 s with use of hands and felt 3/10 on BORG with this 3 MWT: 294.1 ft   GAIT: Distance walked: 271' (completed last session) Assistive device utilized: Single point cane Level of assistance: Modified independence Comments: greatly decreased cadence, decreased step length and height at Rt leg                                                                                                                                TREATMENT DATE:  06/16/24 Treadmill x 7 min level 1.5 mph Step ups + knee drive on airex with 89# unilateral KB hold x 20 bilateral  Standing hip abduction 2 x 10 0# in // bars Standing hip extension 2 x 10 0# in // bars Squats 2 x 10 in // bars Seated LAQ 2 x 10 with 7 lb ankle weights Seated march with 7 lb ankle weights x 20 Sit to stand 2 x 10 10# KB Modified Deadlift 10# x 10     06/01/24 Treadmill x 5 min level 1.5 mph Standing hip abduction 2 x 10 0# in // bars Standing hip extension 2 x 10 0# in // bars Squats 2 x 10 in // bars Seated LAQ 2 x 10 with 7 lb ankle weights Seated march with 7 lb ankle  weights x 20 Sit to stand 2 x 10 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    05/18/24 Nustep x 8 min level 5 Re-assessment completed Sit to stand 2 x 10 Seated LAQ 2 x 10 each LE with 5 lbs Gait training with SPC during 3 MWT Functional work simulation training using wheelchair and pushing 200 lbs on level surface x 200 feet    PATIENT EDUCATION:  Education details: Museum/gallery conservator Person educated: Patient Education method: Programmer, multimedia, Demonstration, Tactile cues, Verbal cues, and Handouts Education comprehension: verbalized understanding, returned demonstration, verbal cues required, tactile cues required, and needs further education  HOME EXERCISE PROGRAM: Access Code: [email or web address] URL: [email or web address] Date: 02/03/2024 Prepared by: Mliss  Exercises - Supine March  - 1 x  daily - 7 x weekly - 2 sets - 10 reps - Bent Knee Fallouts  - 1 x daily - 7 x weekly - 2 sets - 10 reps - Supine Hip Adductor Squeeze with Small Ball  - 1 x daily - 7 x weekly - 2 sets - 10 reps - Sit to Stand  - 3 x daily - 7 x weekly - 1-2 sets - 5-10 reps - Hooklying Clamshell with Resistance  - 1 x daily - 3 x weekly - 2 sets - 10 reps  ASSESSMENT:  CLINICAL IMPRESSION: Lenon presents to therapy with 6/10 pain. He is tolerating current exercise intensity well. His goal is to return to work soon . 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 He thinks getting people out of a chair and pushing the wheelchair would be the most challenging thing for him. Will continue to work on this with therapy. Patient will benefit from skilled PT to address the below impairments and improve overall function.    Eval: Patient is a 43 y.o. male  who was seen today for physical therapy evaluation and  treatment for Rt leg weakness. Pt has complex history of Burkitt lymphoma with chemo treatments now completed. Pt reports he was exhausted during his treatments and feels like he is very weak and fatigued now. Does have numbness in bil thighs only posterior and anteriorly and feels like his Rt leg gives out to the point of him having 5 falls in the last several months. Pt does demonstrate gait deficits, very slow cadence and heavy trunk lean onto [facility] which pt uses on RT hand reporting he has more able to catch himself with the cane him this hand if his knee gives. Pt found to have decreased strength bil LE but worse in Rt leg. Pt demonstrated high fall risk based on 5xSTS and after completing this was very fatigued and needed several minutes to recover stating 7/10 on BORG scale. LEFS score 22 / 80. 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   OBJECTIVE IMPAIRMENTS: Abnormal gait, decreased activity tolerance, decreased balance, decreased coordination, decreased endurance, decreased knowledge of use of DME, decreased mobility, difficulty walking, decreased strength, increased fascial restrictions, impaired perceived functional ability, increased muscle spasms, impaired flexibility, impaired sensation, improper body mechanics, postural dysfunction, and pain.   ACTIVITY LIMITATIONS: carrying, lifting, bending, sitting, standing, squatting, stairs, transfers, bed mobility, and locomotion level  PARTICIPATION LIMITATIONS: cleaning, laundry, interpersonal relationship, driving, shopping, community activity, occupation, and yard work  PERSONAL FACTORS: Fitness, Time since onset of injury/illness/exacerbation, and 1 comorbidity: medical history  are also affecting patient's functional outcome.   REHAB POTENTIAL: Good  CLINICAL DECISION MAKING: Evolving/moderate complexity  EVALUATION COMPLEXITY:  Moderate   GOALS: Goals reviewed with patient? Yes  SHORT TERM GOALS: Target date: 02/25/24 Pt to be I with HEP.  Baseline: Goal status: In progress (compliance is poor)  2.  Pt to report improved LEFS score of 30 to improve toward lowering functional impairment limitations.  Baseline:  Goal status: MET 04/20/2024  3.  Pt to demonstrate ability to complete body weight squats without hands for 10 reps to improve LE strength for tolerance for walking.  Baseline:  Goal status: MET 03/23/24  4.  Pt to demonstrate improved gait mechanics with even step length and height to be able  to decreased fall risk with community ambulation.   Baseline:  Goal status: MET 03/23/24   LONG TERM GOALS: Target date: 05/18/24  Pt to be I with advanced HEP.  Baseline:  Goal status: MET 05/18/24  2.  Pt to demonstrate 10# squats without compensatory strategies for improved hip strength for tolerance to return to work Baseline:  Goal status: In Progress  3.  Pt to report ability to walk with or without AD for at least 30 mins with no increase in pain for tolerance to return to work.  Baseline:  Goal status: In Progress  4.  Pt to demonstrate 5xSTS in 7s to achieve age related norm for decreased fall risk Baseline:  Goal status: In Progress  5.  Pt to demonstrate at least 4+/5 bil hip strength for improved pelvic stability and functional squats without leakage.  Baseline:  Goal status: In Progress   PLAN:  PT FREQUENCY: 2x/week  PT DURATION: 8 weeks  PLANNED INTERVENTIONS: 97110-Therapeutic exercises, 97530- Therapeutic activity, 97112- Neuromuscular re-education, [phone]- Self Care, 02859- Manual therapy, [phone]- Gait training, [phone]- Aquatic Therapy, [phone]- Electrical stimulation (manual), [id number]- Vasopneumatic device, [id number]- Ultrasound, [id number]- Ionotophoresis 4mg /ml Dexamethasone , Patient/Family education, Balance training, Stair training, Taping, Dry Needling, Joint mobilization, Spinal mobilization,  Scar mobilization, DME instructions, Cryotherapy, Moist heat, and Biofeedback  PLAN FOR NEXT SESSION:  Continue LE strengthening ;standing balance, core strengthening  Kristeen Sar, PT 06/16/24 1:08 PM [facility] [address], [address] [place], [place] [address] Phone # [phone] Fax [phone]

## 2024-06-17 ENCOUNTER — Encounter: Payer: Self-pay | Admitting: Physical Therapy

## 2024-06-17 ENCOUNTER — Ambulatory Visit: Admitting: Physical Therapy

## 2024-06-17 DIAGNOSIS — R29898 Other symptoms and signs involving the musculoskeletal system: Secondary | ICD-10-CM | POA: Diagnosis not present

## 2024-06-17 DIAGNOSIS — R296 Repeated falls: Secondary | ICD-10-CM

## 2024-06-17 DIAGNOSIS — R293 Abnormal posture: Secondary | ICD-10-CM

## 2024-06-17 DIAGNOSIS — R252 Cramp and spasm: Secondary | ICD-10-CM

## 2024-06-17 DIAGNOSIS — M6281 Muscle weakness (generalized): Secondary | ICD-10-CM

## 2024-06-17 DIAGNOSIS — R262 Difficulty in walking, not elsewhere classified: Secondary | ICD-10-CM

## 2024-06-17 NOTE — Therapy (Addendum)
 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)    HIV infection (HCC)    Internal hemorrhoids    Obesity    Rectal ulcer    Past Surgical History:  Procedure Laterality Date   IR IMAGING GUIDED PORT INSERTION  11/18/2022   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   Neutropenia (HCC) 06/26/2023   Rash 06/26/2023   Intractable back pain suspect Granix-related 02/21/2023   Lactic acidosis 02/21/2023   Thrombocytopenia (HCC) 02/01/2023   Hyponatremia 01/31/2023   Hypokalemia 01/23/2023   Hematuria 01/11/2023   Neutropenic fever (HCC) 01/10/2023   Pancytopenia (HCC) 01/10/2023   Acute cystitis 01/10/2023   Anemia 01/01/2023   Healthcare maintenance 12/19/2022   Encounter for chemotherapy management 12/12/2022   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HCC) 12/10/2022   Decreased appetite 12/02/2022   Drug-induced constipation 11/21/2022   Nausea without vomiting 11/21/2022   Acute nonintractable headache 11/21/2022   Encounter for antineoplastic chemotherapy 11/18/2022   Burkitt lymphoma of lymph nodes of multiple regions [facility]) 11/14/2022   Counseling regarding advance care planning and goals of care 11/14/2022   Chest mass 11/10/2022   Exposure to chlamydia 10/24/2022   Submental lymphadenopathy 09/05/2022   Goals of care, counseling/discussion  07/26/2022   History of syphilis 07/26/2022   Neck swelling 07/26/2022   AIDS (acquired immune deficiency syndrome) (HCC) 07/25/2022    PCP: [name], MD  REFERRING PROVIDER: [name], MD  REFERRING DIAG: C83.78 (ICD-10-CM) - Burkitt lymphoma of lymph nodes of multiple regions [facility])  THERAPY DIAG:  Muscle weakness (generalized)  Abnormality of gait and mobility  Rationale for Evaluation and Treatment: Rehabilitation  ONSET DATE: 6-7 months  SUBJECTIVE:  SUBJECTIVE STATEMENT: Patient reports he was tired after last treatment session. Pain is 6/10.  Eval: Rt leg weakness and gives out sometimes, cramps throughout the night and sometimes while walking. Uses SPC and this helps a lot with prevention of falling when it gives out.  No pattern with walking or standing time with weakness.  4-5 falls. Does have numbness and tingling in both legs throughout bil thighs Did finish cancer treatments last year, does still port. Chemo completed. Wasn't very active during chemo with exhausted and feels so weak now.    PAIN: 06/17/2024 Are you having pain? Yes NPRS scale: 6/10 Pain location: bil thighs  Pain type: aching and dull does report sharp with cramping at night Pain description: constant   Aggravating factors: cramping at night is highest pain; walking, standing Relieving factors: sitting to rest  PRECAUTIONS: Other: recent cancer   RED FLAGS: None   WEIGHT BEARING RESTRICTIONS: No  FALLS:  Has patient fallen in last 6 months? Yes. Number of falls 5 (most recent Sunday)  03/23/24: No falls in the past 4 weeks  LIVING ENVIRONMENT: Lives with: lives with their family Lives in: House/apartment Stairs: No Has following equipment at home: Single point cane,  FWW  OCCUPATION: off work right now, CNA  PLOF: Independent  PATIENT GOALS: to feel stronger and get back to how I was before cancer  PERTINENT HISTORY:  HIV/AIDs; syphilis, rapidly growing right cervical nodal mass extending into the level 2 nodal station of the right supraclavicular nodal station and extension into the superficial right sternocleidomastoid muscle.  There is also hypermetabolic right axillary nodal disease.diagnosed in November/December 2023 Sexual abuse: NO  NEXT MD VISIT: 04/05/24 - oncologist  OBJECTIVE:  Note: Objective measures were completed at Evaluation unless otherwise noted.  DIAGNOSTIC FINDINGS:   Xray - hip 01/27/24 Mild degenerative changes to the hip joint  CT NECK WITH CONTRAST 12/28/23  IMPRESSION: 1. Resolution of the large indistinctly marginated mass lesion throughout the anterior lower neck, previously more extensive on the right than the left. Mild scarring of the right sternocleidomastoid muscle but no evidence of recurrent mass lesion. 2. Stable appearance of level 1 lymph nodes, the largest showing short axis dimension of 1 cm. Previously seen mass/adenopathy in the right level 2 region has resolved without residual. Other cervical chain lymph nodes on both sides of the neck are within normal limits in size.   PATIENT SURVEYS:  Initial eval: LEFS Lower Extremity Functional Score: 22 / 80 = 27.5 % indicative of moderate functional limitation   03/23/24: LEFS Lower Extremity Functional Score: 12 / 80 = 15 % indicative of severe functional limitation  (Patient explains he paid more attention to the details of the questions)  04/20/2024 LEFS: 47/80= 58.8%  COGNITION: Overall cognitive status: Within functional limits for tasks assessed     SENSATION: Numbness in both thighs worse with pain, can feel pressure but light touch   POSTURE: rounded shoulders and anterior pelvic tilt  PALPATION: TTP at Rt greater trochanter, tightness  noted in Rt hip flexor, Rt lumbar paraspinal  LOWER EXTREMITY ROM:  WFL without pain for full flexion however at end range extension pt reports pain at proximal lateral hip on Rt  LOWER EXTREMITY MMT:  MMT Right Eval */5 Right  03/23/24 Right 05/18/24 Left eval Left 03/23/24 Left  05/18/24  Hip flexion 3 3 3+ 4+ 4+ 4+  Hip extension 3+ 3+ 4 4+ 4+ 4+  Hip abduction 3+ 4+ 4+ 4 5 5  Hip adduction 3+ 4+ 4+ 4+ 4+ 4+  Hip internal rotation        Hip external rotation        Knee flexion 3+ 4 4+ 4 4+ 4+  Knee extension 3 3+ 4- 4 4+ 4+  Ankle dorsiflexion 3+ 4- 4 4+ 4+ 4+  Ankle plantarflexion        Ankle inversion        Ankle eversion         (Blank rows = not tested)   FUNCTIONAL TESTS:  Initial eval: 5 times sit to stand: 24.06s with use of hands and felt 7/10 on BORG with this  03/23/24: 5 times sit to stand: 17.85 s with use of hands and felt 3/10 on BORG with this  05/18/24: 5 times sit to stand: 12.87 s with use of hands and felt 3/10 on BORG with this 3 MWT: 294.1 ft   GAIT: Distance walked: 271' (completed last session) Assistive device utilized: Single point cane Level of assistance: Modified independence Comments: greatly decreased cadence, decreased step length and height at Rt leg                                                                                                                                TREATMENT DATE:  06/17/24 Patient was 13 mins late to appointment NuStep Level 5 6 - PT present to discuss status Leg Pres (seat 9) 90# 2 x 10 Sit to stand + chest press 10# x 10 Modified Deadlift 10# x 10  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       06/16/24 Treadmill x 7 min level 1.5 mph Step ups + knee drive on airex with 89# unilateral KB hold x 20 bilateral  Standing hip abduction 2 x 10 0# in // bars Standing hip  extension 2 x 10 0# in // bars Squats 2 x 10 in // bars Seated LAQ 2 x 10 with 7 lb ankle weights Seated march with 7 lb ankle weights x 20 Sit to stand 2 x 10 10# KB Modified Deadlift 10# x 10     06/01/24 Treadmill x 5 min level 1.5 mph Standing hip abduction 2 x 10 0# in // bars Standing hip extension 2 x 10 0# in // bars Squats 2 x 10 in // bars Seated LAQ 2 x 10 with 7 lb ankle weights Seated march with 7 lb ankle weights x 20 Sit to stand 2 x 10 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       PATIENT EDUCATION:  Education details: [email or web address] Person educated: Patient Education method: Explanation, Demonstration, Tactile cues, Verbal cues, and Handouts Education comprehension: verbalized understanding, returned demonstration, verbal cues required, tactile cues  required, and needs further education  HOME EXERCISE PROGRAM: Access Code: [email or web address] URL: [email or web address] Date: 02/03/2024 Prepared by: Mliss  Exercises - Supine March  - 1 x daily - 7 x weekly - 2 sets - 10 reps - Bent Knee Fallouts  - 1 x daily - 7 x weekly - 2 sets - 10 reps - Supine Hip Adductor Squeeze with Small Ball  - 1 x daily - 7 x weekly - 2 sets - 10 reps - Sit to Stand  - 3 x daily - 7 x weekly - 1-2 sets - 5-10 reps - Hooklying Clamshell with Resistance  - 1 x daily - 3 x weekly - 2 sets - 10 reps  ASSESSMENT:  CLINICAL IMPRESSION: Tarin verbalized some fatigue after last treatment session. Today's treatment session focused on LE strengthening and simulating work duties. Patient required occasional seated rest breaks due to fatigue. 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 Required verbal cues to not use the wall while ambulating between treatment gyms. Patient is making  good progress would physical therapy he just needs more visits to focus on strengthening and returning to working. Patient will benefit from skilled PT to address the below impairments and improve overall function.     Eval: Patient is a 43 y.o. male  who was seen today for physical therapy evaluation and treatment for Rt leg weakness. Pt has complex history of Burkitt lymphoma with chemo treatments now completed. Pt reports he was exhausted during his treatments and feels like he is very weak and fatigued now. Does have numbness in bil thighs only posterior and anteriorly and feels like his Rt leg gives out to the point of him having 5 falls in the last several months. Pt does demonstrate gait deficits, very slow cadence and heavy trunk lean onto [facility] which pt uses on RT hand reporting he has more able to catch himself with the cane him this hand if his knee gives. Pt found to have decreased strength bil LE but worse in Rt leg. Pt demonstrated high fall risk based on 5xSTS and after completing this was very fatigued and needed several minutes to recover stating 7/10 on BORG scale. LEFS score 22 / 80. 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   OBJECTIVE IMPAIRMENTS: Abnormal gait, decreased activity tolerance, decreased balance, decreased coordination, decreased endurance, decreased knowledge of use of DME, decreased mobility, difficulty walking, decreased strength, increased fascial restrictions, impaired perceived functional ability, increased muscle spasms, impaired flexibility, impaired sensation, improper body mechanics, postural dysfunction, and pain.   ACTIVITY LIMITATIONS: carrying, lifting, bending, sitting, standing, squatting, stairs, transfers, bed mobility, and locomotion level  PARTICIPATION LIMITATIONS: cleaning, laundry, interpersonal relationship, driving, shopping, community activity, occupation,  and yard work  PERSONAL FACTORS: Fitness, Time since onset of injury/illness/exacerbation, and 1 comorbidity: medical history  are also affecting patient's functional outcome.   REHAB POTENTIAL: Good  CLINICAL DECISION MAKING: Evolving/moderate complexity  EVALUATION COMPLEXITY: Moderate   GOALS: Goals reviewed with patient? Yes  SHORT TERM GOALS: Target date: 02/25/24 Pt to be I with HEP.  Baseline: Goal status: MET 06/17/2024  2.  Pt to report improved LEFS score of 30 to improve toward lowering functional impairment limitations.  Baseline:  Goal status: MET 04/20/2024  3.  Pt to demonstrate ability to complete body weight  squats without hands for 10 reps to improve LE strength for tolerance for walking.  Baseline:  Goal status: MET 03/23/24  4.  Pt to demonstrate improved gait mechanics with even step length and height to be able to decreased fall risk with community ambulation.   Baseline:  Goal status: MET 03/23/24   LONG TERM GOALS: Target date: 07/13/24  Pt to be I with advanced HEP.  Baseline:  Goal status: MET 05/18/24  2.  Pt to demonstrate 10# squats without compensatory strategies for improved hip strength for tolerance to return to work Baseline:  Goal status: Partially met (15# but sit to stand) 06/17/2024  3.  Pt to report ability to walk with or without AD for at least 30 mins with no increase in pain for tolerance to return to work.  Baseline:  Goal status: In Progress (06/17/2024)  4.  Pt to demonstrate 5xSTS in 7s to achieve age related norm for decreased fall risk Baseline:  Goal status: In Progress (06/17/2024)  5.  Pt to demonstrate at least 4+/5 bil hip strength for improved pelvic stability and functional squats without leakage.  Baseline:  Goal status: MET 06/17/2024   PLAN:  PT FREQUENCY: 2x/week  PT DURATION: 8 weeks  PLANNED INTERVENTIONS: 97110-Therapeutic exercises, 97530- Therapeutic activity, 97112- Neuromuscular re-education, 97535- Self Care,  97140- Manual therapy, [phone]- Gait training, [id number]- Aquatic Therapy, [phone]- Electrical stimulation (manual), [id number]- Vasopneumatic device, [id number]- Ultrasound, [id number]- Ionotophoresis 4mg /ml Dexamethasone , Patient/Family education, Balance training, Stair training, Taping, Dry Needling, Joint mobilization, Spinal mobilization, Scar mobilization, DME instructions, Cryotherapy, Moist heat, and Biofeedback  PLAN FOR NEXT SESSION:  Continue LE strengthening ;standing balance, core strengthening  Kristeen Sar, PT 06/17/24 12:04 PM [facility] [address] [place], [place] [address] Phone # [phone] Fax [phone]

## 2024-06-21 ENCOUNTER — Ambulatory Visit: Admitting: Physical Therapy

## 2024-06-21 ENCOUNTER — Encounter: Payer: Self-pay | Admitting: Physical Therapy

## 2024-06-21 DIAGNOSIS — M6281 Muscle weakness (generalized): Secondary | ICD-10-CM

## 2024-06-21 DIAGNOSIS — R29898 Other symptoms and signs involving the musculoskeletal system: Secondary | ICD-10-CM

## 2024-06-21 DIAGNOSIS — R296 Repeated falls: Secondary | ICD-10-CM

## 2024-06-21 DIAGNOSIS — R293 Abnormal posture: Secondary | ICD-10-CM

## 2024-06-21 DIAGNOSIS — R262 Difficulty in walking, not elsewhere classified: Secondary | ICD-10-CM

## 2024-06-21 DIAGNOSIS — R252 Cramp and spasm: Secondary | ICD-10-CM

## 2024-06-21 NOTE — Therapy (Signed)
 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)    HIV infection (HCC)    Internal hemorrhoids    Obesity    Rectal ulcer    Past Surgical History:  Procedure Laterality Date   IR IMAGING GUIDED PORT INSERTION  11/18/2022   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   Neutropenia (HCC) 06/26/2023   Rash 06/26/2023   Intractable back pain suspect Granix-related 02/21/2023   Lactic acidosis 02/21/2023   Thrombocytopenia (HCC) 02/01/2023   Hyponatremia 01/31/2023   Hypokalemia 01/23/2023   Hematuria 01/11/2023   Neutropenic fever (HCC) 01/10/2023   Pancytopenia (HCC) 01/10/2023   Acute cystitis 01/10/2023   Anemia 01/01/2023   Healthcare maintenance 12/19/2022   Encounter for chemotherapy management 12/12/2022   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HCC) 12/10/2022   Decreased appetite 12/02/2022   Drug-induced constipation 11/21/2022   Nausea without vomiting 11/21/2022   Acute nonintractable headache 11/21/2022   Encounter for antineoplastic chemotherapy 11/18/2022   Burkitt lymphoma of lymph nodes of multiple regions [facility]) 11/14/2022   Counseling regarding advance care planning and goals of care 11/14/2022   Chest mass 11/10/2022   Exposure to chlamydia 10/24/2022   Submental lymphadenopathy 09/05/2022   Goals of care, counseling/discussion 07/26/2022    History of syphilis 07/26/2022   Neck swelling 07/26/2022   AIDS (acquired immune deficiency syndrome) (HCC) 07/25/2022    PCP: [name], MD  REFERRING PROVIDER: [name], MD  REFERRING DIAG: C83.78 (ICD-10-CM) - Burkitt lymphoma of lymph nodes of multiple regions [facility])  THERAPY DIAG:  Muscle weakness (generalized)  Abnormality of gait and mobility  Rationale for Evaluation and Treatment: Rehabilitation  ONSET DATE: 6-7 months  SUBJECTIVE:  SUBJECTIVE STATEMENT: Patient reports he is doing okay today. Pain 5/10  Eval: Rt leg weakness and gives out sometimes, cramps throughout the night and sometimes while walking. Uses SPC and this helps a lot with prevention of falling when it gives out.  No pattern with walking or standing time with weakness.  4-5 falls. Does have numbness and tingling in both legs throughout bil thighs Did finish cancer treatments last year, does still port. Chemo completed. Wasn't very active during chemo with exhausted and feels so weak now.    PAIN: 06/21/2024 Are you having pain? Yes NPRS scale: 5/10 Pain location: bil thighs  Pain type: aching and dull does report sharp with cramping at night Pain description: constant   Aggravating factors: cramping at night is highest pain; walking, standing Relieving factors: sitting to rest  PRECAUTIONS: Other: recent cancer   RED FLAGS: None   WEIGHT BEARING RESTRICTIONS: No  FALLS:  Has patient fallen in last 6 months? Yes. Number of falls 5 (most recent Sunday)  03/23/24: No falls in the past 4 weeks  LIVING ENVIRONMENT: Lives with: lives with their family Lives in: House/apartment Stairs: No Has following equipment at home: Single point cane, FWW  OCCUPATION: off work right now,  CNA  PLOF: Independent  PATIENT GOALS: to feel stronger and get back to how I was before cancer  PERTINENT HISTORY:  HIV/AIDs; syphilis, rapidly growing right cervical nodal mass extending into the level 2 nodal station of the right supraclavicular nodal station and extension into the superficial right sternocleidomastoid muscle.  There is also hypermetabolic right axillary nodal disease.diagnosed in November/December 2023 Sexual abuse: NO  NEXT MD VISIT: 04/05/24 - oncologist  OBJECTIVE:  Note: Objective measures were completed at Evaluation unless otherwise noted.  DIAGNOSTIC FINDINGS:   Xray - hip 01/27/24 Mild degenerative changes to the hip joint  CT NECK WITH CONTRAST 12/28/23  IMPRESSION: 1. Resolution of the large indistinctly marginated mass lesion throughout the anterior lower neck, previously more extensive on the right than the left. Mild scarring of the right sternocleidomastoid muscle but no evidence of recurrent mass lesion. 2. Stable appearance of level 1 lymph nodes, the largest showing short axis dimension of 1 cm. Previously seen mass/adenopathy in the right level 2 region has resolved without residual. Other cervical chain lymph nodes on both sides of the neck are within normal limits in size.   PATIENT SURVEYS:  Initial eval: LEFS Lower Extremity Functional Score: 22 / 80 = 27.5 % indicative of moderate functional limitation   03/23/24: LEFS Lower Extremity Functional Score: 12 / 80 = 15 % indicative of severe functional limitation  (Patient explains he paid more attention to the details of the questions)  04/20/2024 LEFS: 47/80= 58.8%  COGNITION: Overall cognitive status: Within functional limits for tasks assessed     SENSATION: Numbness in both thighs worse with pain, can feel pressure but light touch   POSTURE: rounded shoulders and anterior pelvic tilt  PALPATION: TTP at Rt greater trochanter, tightness noted in Rt hip flexor, Rt lumbar  paraspinal  LOWER EXTREMITY ROM:  WFL without pain for full flexion however at end range extension pt reports pain at proximal lateral hip on Rt  LOWER EXTREMITY MMT:  MMT Right Eval */5 Right  03/23/24 Right 05/18/24 Left eval Left 03/23/24 Left  05/18/24  Hip flexion 3 3 3+ 4+ 4+ 4+  Hip extension 3+ 3+ 4 4+ 4+ 4+  Hip abduction 3+ 4+ 4+ 4 5 5   Hip adduction 3+  4+ 4+ 4+ 4+ 4+  Hip internal rotation        Hip external rotation        Knee flexion 3+ 4 4+ 4 4+ 4+  Knee extension 3 3+ 4- 4 4+ 4+  Ankle dorsiflexion 3+ 4- 4 4+ 4+ 4+  Ankle plantarflexion        Ankle inversion        Ankle eversion         (Blank rows = not tested)   FUNCTIONAL TESTS:  Initial eval: 5 times sit to stand: 24.06s with use of hands and felt 7/10 on BORG with this  03/23/24: 5 times sit to stand: 17.85 s with use of hands and felt 3/10 on BORG with this  05/18/24: 5 times sit to stand: 12.87 s with use of hands and felt 3/10 on BORG with this 3 MWT: 294.1 ft   GAIT: Distance walked: 271' (completed last session) Assistive device utilized: Single point cane Level of assistance: Modified independence Comments: greatly decreased cadence, decreased step length and height at Rt leg                                                                                                                                TREATMENT DATE:  06/21/24  Sit to stand + chest press 15# x 10 Modified Deadlift 15# x 10  Unilateral 15# farmer's carry x 2 laps around cancer gym (switching hands) Leg Pres (seat 9) 90# 2 x 10;  unilateral 50# x 10 bilateral  Treadmill x 12 min increasing to 2.0 mph Seated alternating chest press 7# x 10 bilateral  Seated biceps curls 7# x 12    06/17/24 Patient was 13 mins late to appointment NuStep Level 5 6 - PT present to discuss status Leg Pres (seat 9) 90# 2 x 10 Sit to stand + chest press 10# x 10 Modified Deadlift 10# x 10  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       06/16/24 Treadmill x 7 min level 1.5 mph Step ups + knee drive on airex with 89# unilateral KB hold x 20 bilateral  Standing hip abduction 2 x 10 0# in // bars Standing hip extension 2 x 10 0# in // bars Squats 2 x 10 in // bars Seated LAQ 2 x 10 with 7 lb ankle weights Seated march with 7 lb ankle weights x 20 Sit to stand 2 x 10 10# KB Modified Deadlift 10# x 10     06/01/24 Treadmill x 5 min level 1.5 mph Standing hip abduction 2 x 10 0# in // bars Standing hip extension 2 x 10 0# in // bars Squats 2 x 10 in // bars Seated LAQ 2 x 10 with 7 lb ankle weights Seated march with 7 lb ankle weights x 20  Sit to stand 2 x 10 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     PATIENT EDUCATION:  Education details: [email or web address] Person educated: Patient Education method: Explanation, Demonstration, Tactile cues, Verbal cues, and Handouts Education comprehension: verbalized understanding, returned demonstration, verbal cues required, tactile cues required, and needs further education  HOME EXERCISE PROGRAM: Access Code: [email or web address] URL: [email or web address] Date: 02/03/2024 Prepared by: Mliss  Exercises - Supine March  - 1 x daily - 7 x weekly - 2 sets - 10 reps - Bent Knee Fallouts  - 1 x daily - 7 x weekly - 2 sets - 10 reps - Supine Hip Adductor Squeeze with Small Ball  - 1 x daily - 7 x weekly - 2 sets - 10 reps - Sit to Stand  - 3 x daily - 7 x weekly - 1-2 sets - 5-10 reps - Hooklying Clamshell with Resistance  - 1 x daily - 3 x weekly - 2 sets - 10 reps  ASSESSMENT:  CLINICAL IMPRESSION: Today's treatment session focused on functional strengthening to return to work and standing tolerance. Noted improved standing  tolerance with exercises and progressed weight. After three more intense exercises patent required a two minute seated rest break. Overall, patient tolerated treatment session well. PT monitored patient throughout session and provided verbal and visual cues as needed. Patient is progressing appropriately with physical therapy.      Eval: Patient is a 43 y.o. male  who was seen today for physical therapy evaluation and treatment for Rt leg weakness. Pt has complex history of Burkitt lymphoma with chemo treatments now completed. Pt reports he was exhausted during his treatments and feels like he is very weak and fatigued now. Does have numbness in bil thighs only posterior and anteriorly and feels like his Rt leg gives out to the point of him having 5 falls in the last several months. Pt does demonstrate gait deficits, very slow cadence and heavy trunk lean onto [facility] which pt uses on RT hand reporting he has more able to catch himself with the cane him this hand if his knee gives. Pt found to have decreased strength bil LE but worse in Rt leg. Pt demonstrated high fall risk based on 5xSTS and after completing this was very fatigued and needed several minutes to recover stating 7/10 on BORG scale. LEFS score 22 / 80. 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   OBJECTIVE IMPAIRMENTS: Abnormal gait, decreased activity tolerance, decreased balance, decreased coordination, decreased endurance, decreased knowledge of use of DME, decreased mobility, difficulty walking, decreased strength, increased fascial restrictions, impaired perceived functional ability, increased muscle spasms, impaired flexibility, impaired sensation, improper body mechanics, postural dysfunction, and pain.   ACTIVITY LIMITATIONS: carrying, lifting, bending, sitting, standing, squatting, stairs, transfers, bed mobility, and locomotion  level  PARTICIPATION LIMITATIONS: cleaning, laundry, interpersonal relationship, driving, shopping, community activity, occupation, and yard work  PERSONAL FACTORS: Fitness, Time since onset of injury/illness/exacerbation, and 1 comorbidity: medical history  are also affecting patient's functional outcome.   REHAB POTENTIAL: Good  CLINICAL DECISION MAKING: Evolving/moderate complexity  EVALUATION COMPLEXITY: Moderate   GOALS: Goals reviewed with patient? Yes  SHORT TERM GOALS: Target date: 02/25/24 Pt to be I with HEP.  Baseline: Goal status: In progress (compliance is poor)  2.  Pt to  report improved LEFS score of 30 to improve toward lowering functional impairment limitations.  Baseline:  Goal status: MET 04/20/2024  3.  Pt to demonstrate ability to complete body weight squats without hands for 10 reps to improve LE strength for tolerance for walking.  Baseline:  Goal status: MET 03/23/24  4.  Pt to demonstrate improved gait mechanics with even step length and height to be able to decreased fall risk with community ambulation.   Baseline:  Goal status: MET 03/23/24   LONG TERM GOALS: Target date: 05/18/24  Pt to be I with advanced HEP.  Baseline:  Goal status: MET 05/18/24  2.  Pt to demonstrate 10# squats without compensatory strategies for improved hip strength for tolerance to return to work Baseline:  Goal status: In Progress  3.  Pt to report ability to walk with or without AD for at least 30 mins with no increase in pain for tolerance to return to work.  Baseline:  Goal status: In Progress  4.  Pt to demonstrate 5xSTS in 7s to achieve age related norm for decreased fall risk Baseline:  Goal status: In Progress  5.  Pt to demonstrate at least 4+/5 bil hip strength for improved pelvic stability and functional squats without leakage.  Baseline:  Goal status: In Progress   PLAN:  PT FREQUENCY: 2x/week  PT DURATION: 8 weeks  PLANNED INTERVENTIONS:  97110-Therapeutic exercises, 97530- Therapeutic activity, 97112- Neuromuscular re-education, [phone]- Self Care, 02859- Manual therapy, [phone]- Gait training, [phone]- Aquatic Therapy, [phone]- Electrical stimulation (manual), [id number]- Vasopneumatic device, [id number]- Ultrasound, [id number]- Ionotophoresis 4mg /ml Dexamethasone , Patient/Family education, Balance training, Stair training, Taping, Dry Needling, Joint mobilization, Spinal mobilization, Scar mobilization, DME instructions, Cryotherapy, Moist heat, and Biofeedback  PLAN FOR NEXT SESSION:  Continue LE strengthening ;standing balance, core strengthening  Kristeen Sar, PT 06/21/24 1:52 PM [facility] [address], [address] [place], [place] [address] Phone # [phone] Fax [phone]

## 2024-06-22 ENCOUNTER — Other Ambulatory Visit: Payer: Self-pay

## 2024-06-23 ENCOUNTER — Ambulatory Visit: Admitting: Physical Therapy

## 2024-06-28 ENCOUNTER — Other Ambulatory Visit: Payer: Self-pay

## 2024-06-29 ENCOUNTER — Encounter

## 2024-06-29 ENCOUNTER — Encounter: Payer: Self-pay | Admitting: Hematology

## 2024-06-29 ENCOUNTER — Other Ambulatory Visit: Payer: Self-pay

## 2024-06-29 ENCOUNTER — Other Ambulatory Visit: Payer: Self-pay | Admitting: Pharmacy Technician

## 2024-06-29 NOTE — Progress Notes (Signed)
 Specialty Pharmacy Refill Coordination Note  Logan Ward is a 43 y.o. male contacted today regarding refills of specialty medication(s) Bictegravir-Emtricitab-Tenofov (Biktarvy )   Patient requested Delivery   Delivery date: 07/06/24   Verified address: [address]  [place]   Medication will be filled on 07/05/24.

## 2024-07-01 ENCOUNTER — Encounter: Admitting: Physical Therapy

## 2024-07-05 ENCOUNTER — Other Ambulatory Visit: Payer: Self-pay

## 2024-07-06 ENCOUNTER — Ambulatory Visit

## 2024-07-06 ENCOUNTER — Encounter: Admitting: Physical Therapy

## 2024-07-06 DIAGNOSIS — R252 Cramp and spasm: Secondary | ICD-10-CM

## 2024-07-06 DIAGNOSIS — R29898 Other symptoms and signs involving the musculoskeletal system: Secondary | ICD-10-CM | POA: Diagnosis not present

## 2024-07-06 DIAGNOSIS — R262 Difficulty in walking, not elsewhere classified: Secondary | ICD-10-CM

## 2024-07-06 DIAGNOSIS — R296 Repeated falls: Secondary | ICD-10-CM

## 2024-07-06 DIAGNOSIS — M6281 Muscle weakness (generalized): Secondary | ICD-10-CM

## 2024-07-06 DIAGNOSIS — R293 Abnormal posture: Secondary | ICD-10-CM

## 2024-07-06 NOTE — Therapy (Signed)
 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)    HIV infection (HCC)    Internal hemorrhoids    Obesity    Rectal ulcer    Past Surgical History:  Procedure Laterality Date   IR IMAGING GUIDED PORT INSERTION  11/18/2022   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   Neutropenia (HCC) 06/26/2023   Rash 06/26/2023   Intractable back pain suspect Granix-related 02/21/2023   Lactic acidosis 02/21/2023   Thrombocytopenia (HCC) 02/01/2023   Hyponatremia 01/31/2023   Hypokalemia 01/23/2023   Hematuria 01/11/2023   Neutropenic fever (HCC) 01/10/2023   Pancytopenia (HCC) 01/10/2023   Acute cystitis 01/10/2023   Anemia 01/01/2023   Healthcare maintenance 12/19/2022   Encounter for chemotherapy management 12/12/2022   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HCC) 12/10/2022   Decreased appetite 12/02/2022   Drug-induced constipation 11/21/2022   Nausea without vomiting 11/21/2022   Acute nonintractable headache 11/21/2022   Encounter for antineoplastic chemotherapy 11/18/2022   Burkitt lymphoma of lymph nodes of multiple regions [facility]) 11/14/2022   Counseling regarding advance care planning and goals of care 11/14/2022   Chest mass 11/10/2022   Exposure to chlamydia 10/24/2022   Submental lymphadenopathy 09/05/2022   Goals of care,  counseling/discussion 07/26/2022   History of syphilis 07/26/2022   Neck swelling 07/26/2022   AIDS (acquired immune deficiency syndrome) (HCC) 07/25/2022    PCP: Logan Emaline Brink, MD  REFERRING PROVIDER: [name], MD  REFERRING DIAG: C83.78 (ICD-10-CM) - Burkitt lymphoma of lymph nodes of multiple regions [facility])  THERAPY DIAG:  Muscle weakness (generalized)  Abnormality of gait and mobility  Rationale for Evaluation and Treatment: Rehabilitation  ONSET DATE: 6-7 months  SUBJECTIVE:  SUBJECTIVE STATEMENT: Patient reports he is doing okay today. Pain 4/10  Eval: Rt leg weakness and gives out sometimes, cramps throughout the night and sometimes while walking. Uses SPC and this helps a lot with prevention of falling when it gives out.  No pattern with walking or standing time with weakness.  4-5 falls. Does have numbness and tingling in both legs throughout bil thighs Did finish cancer treatments last year, does still port. Chemo completed. Wasn't very active during chemo with exhausted and feels so weak now.    PAIN: 07/06/2024 Are you having pain? Yes NPRS scale: 4/10 Pain location: bil thighs  Pain type: aching and dull does report sharp with cramping at night Pain description: constant   Aggravating factors: cramping at night is highest pain; walking, standing Relieving factors: sitting to rest  PRECAUTIONS: Other: recent cancer   RED FLAGS: None   WEIGHT BEARING RESTRICTIONS: No  FALLS:  Has patient fallen in last 6 months? Yes. Number of falls 5 (most recent Sunday)  03/23/24: No falls in the past 4 weeks  LIVING ENVIRONMENT: Lives with: lives with their family Lives in: House/apartment Stairs: No Has following equipment at home: Single point cane,  FWW  OCCUPATION: off work right now, CNA  PLOF: Independent  PATIENT GOALS: to feel stronger and get back to how I was before cancer  PERTINENT HISTORY:  HIV/AIDs; syphilis, rapidly growing right cervical nodal mass extending into the level 2 nodal station of the right supraclavicular nodal station and extension into the superficial right sternocleidomastoid muscle.  There is also hypermetabolic right axillary nodal disease.diagnosed in November/December 2023 Sexual abuse: NO  NEXT MD VISIT: 04/05/24 - oncologist  OBJECTIVE:  Note: Objective measures were completed at Evaluation unless otherwise noted.  DIAGNOSTIC FINDINGS:   Xray - hip 01/27/24 Mild degenerative changes to the hip joint  CT NECK WITH CONTRAST 12/28/23  IMPRESSION: 1. Resolution of the large indistinctly marginated mass lesion throughout the anterior lower neck, previously more extensive on the right than the left. Mild scarring of the right sternocleidomastoid muscle but no evidence of recurrent mass lesion. 2. Stable appearance of level 1 lymph nodes, the largest showing short axis dimension of 1 cm. Previously seen mass/adenopathy in the right level 2 region has resolved without residual. Other cervical chain lymph nodes on both sides of the neck are within normal limits in size.   PATIENT SURVEYS:  Initial eval: LEFS Lower Extremity Functional Score: 22 / 80 = 27.5 % indicative of moderate functional limitation   03/23/24: LEFS Lower Extremity Functional Score: 12 / 80 = 15 % indicative of severe functional limitation  (Patient explains he paid more attention to the details of the questions)  04/20/2024 LEFS: 47/80= 58.8%  COGNITION: Overall cognitive status: Within functional limits for tasks assessed     SENSATION: Numbness in both thighs worse with pain, can feel pressure but light touch   POSTURE: rounded shoulders and anterior pelvic tilt  PALPATION: TTP at Rt greater trochanter, tightness  noted in Rt hip flexor, Rt lumbar paraspinal  LOWER EXTREMITY ROM:  WFL without pain for full flexion however at end range extension pt reports pain at proximal lateral hip on Rt  LOWER EXTREMITY MMT:  MMT Right Eval */5 Right  03/23/24 Right 05/18/24 Left eval Left 03/23/24 Left  05/18/24  Hip flexion 3 3 3+ 4+ 4+ 4+  Hip extension 3+ 3+ 4 4+ 4+ 4+  Hip abduction 3+ 4+ 4+ 4 5 5   Hip adduction 3+  4+ 4+ 4+ 4+ 4+  Hip internal rotation        Hip external rotation        Knee flexion 3+ 4 4+ 4 4+ 4+  Knee extension 3 3+ 4- 4 4+ 4+  Ankle dorsiflexion 3+ 4- 4 4+ 4+ 4+  Ankle plantarflexion        Ankle inversion        Ankle eversion         (Blank rows = not tested)   FUNCTIONAL TESTS:  Initial eval: 5 times sit to stand: 24.06s with use of hands and felt 7/10 on BORG with this  03/23/24: 5 times sit to stand: 17.85 s with use of hands and felt 3/10 on BORG with this  05/18/24: 5 times sit to stand: 12.87 s with use of hands and felt 3/10 on BORG with this 3 MWT: 294.1 ft   GAIT: Distance walked: 271' (completed last session) Assistive device utilized: Single point cane Level of assistance: Modified independence Comments: greatly decreased cadence, decreased step length and height at Rt leg                                                                                                                                TREATMENT DATE:  07/06/24  Nustep x 10 min level 5 Leg Pres (seat 9) 90# 2 x 10;  unilateral 50# x 10 bilateral  Sit to stand + chest press 15# x 10 in front of mirror for visual feedback Modified Deadlift 15# 2 x 10 in front of mirror for visual feedback Unilateral 15# farmer's carry x 2 laps from ortho gym to end of hall (switching hands at end of hall) Seated LAQ x 20 each LE with 6 lbs Seated biceps curls 7# 2 x 10 Seated alternating chest press 7# 2 x 10 bilateral  Sit to stand 2 x 10 in tandem with left leg back then 2 x 10 with right leg  back   06/21/24  Sit to stand + chest press 15# x 10 Modified Deadlift 15# x 10  Unilateral 15# farmer's carry x 2 laps around cancer gym (switching hands) Leg Pres (seat 9) 90# 2 x 10;  unilateral 50# x 10 bilateral  Treadmill x 12 min increasing to 2.0 mph Seated alternating chest press 7# x 10 bilateral  Seated biceps curls 7# x 12    06/17/24 Patient was 13 mins late to appointment NuStep Level 5 6 - PT present to discuss status Leg Pres (seat 9) 90# 2 x 10 Sit to stand + chest press 10# x 10 Modified Deadlift 10# x 10  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      PATIENT EDUCATION:  Education details: [email or web address] Person educated: Patient Education method: Explanation, Demonstration, Tactile cues, Verbal cues, and Handouts Education comprehension: verbalized  understanding, returned demonstration, verbal cues required, tactile cues required, and needs further education  HOME EXERCISE PROGRAM: Access Code: [email or web address] URL: [email or web address] Date: 02/03/2024 Prepared by: Mliss  Exercises - Supine March  - 1 x daily - 7 x weekly - 2 sets - 10 reps - Bent Knee Fallouts  - 1 x daily - 7 x weekly - 2 sets - 10 reps - Supine Hip Adductor Squeeze with Small Ball  - 1 x daily - 7 x weekly - 2 sets - 10 reps - Sit to Stand  - 3 x daily - 7 x weekly - 1-2 sets - 5-10 reps - Hooklying Clamshell with Resistance  - 1 x daily - 3 x weekly - 2 sets - 10 reps  ASSESSMENT:  CLINICAL IMPRESSION: Arley has made significant progress since beginning PT but is minimally motivated at home.  He states he has been doing more walking and has been better about doing some of his exercises but admits he rests a lot.  He is walking without an assistive device but ambulates slowly and cautiously often reaching for walls for security.  He has one approved visit  left.  We will re-assess next visit for appropriateness of requesting date extension.     Eval: Patient is a 43 y.o. male  who was seen today for physical therapy evaluation and treatment for Rt leg weakness. Pt has complex history of Burkitt lymphoma with chemo treatments now completed. Pt reports he was exhausted during his treatments and feels like he is very weak and fatigued now. Does have numbness in bil thighs only posterior and anteriorly and feels like his Rt leg gives out to the point of him having 5 falls in the last several months. Pt does demonstrate gait deficits, very slow cadence and heavy trunk lean onto [facility] which pt uses on RT hand reporting he has more able to catch himself with the cane him this hand if his knee gives. Pt found to have decreased strength bil LE but worse in Rt leg. Pt demonstrated high fall risk based on 5xSTS and after completing this was very fatigued and needed several minutes to recover stating 7/10 on BORG scale. LEFS score 22 / 80. 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   OBJECTIVE IMPAIRMENTS: Abnormal gait, decreased activity tolerance, decreased balance, decreased coordination, decreased endurance, decreased knowledge of use of DME, decreased mobility, difficulty walking, decreased strength, increased fascial restrictions, impaired perceived functional ability, increased muscle spasms, impaired flexibility, impaired sensation, improper body mechanics, postural dysfunction, and pain.   ACTIVITY LIMITATIONS: carrying, lifting, bending, sitting, standing, squatting, stairs, transfers, bed mobility, and locomotion level  PARTICIPATION LIMITATIONS: cleaning, laundry, interpersonal relationship, driving, shopping, community activity, occupation, and yard work  PERSONAL FACTORS: Fitness, Time since onset of injury/illness/exacerbation, and 1 comorbidity: medical history  are  also affecting patient's functional outcome.   REHAB POTENTIAL: Good  CLINICAL DECISION MAKING: Evolving/moderate complexity  EVALUATION COMPLEXITY: Moderate   GOALS: Goals reviewed with patient? Yes  SHORT TERM GOALS: Target date: 02/25/24 Pt to be I with HEP.  Baseline: Goal status: In progress (compliance is poor)  2.  Pt to report improved LEFS score of 30 to improve toward lowering functional impairment limitations.  Baseline:  Goal status: MET 04/20/2024  3.  Pt to demonstrate ability to complete body weight squats without hands for 10 reps to improve LE strength for tolerance  for walking.  Baseline:  Goal status: MET 03/23/24  4.  Pt to demonstrate improved gait mechanics with even step length and height to be able to decreased fall risk with community ambulation.   Baseline:  Goal status: MET 03/23/24   LONG TERM GOALS: Target date: 05/18/24  Pt to be I with advanced HEP.  Baseline:  Goal status: MET 05/18/24  2.  Pt to demonstrate 10# squats without compensatory strategies for improved hip strength for tolerance to return to work Baseline:  Goal status: In Progress  3.  Pt to report ability to walk with or without AD for at least 30 mins with no increase in pain for tolerance to return to work.  Baseline:  Goal status: In Progress  4.  Pt to demonstrate 5xSTS in 7s to achieve age related norm for decreased fall risk Baseline:  Goal status: In Progress  5.  Pt to demonstrate at least 4+/5 bil hip strength for improved pelvic stability and functional squats without leakage.  Baseline:  Goal status: In Progress   PLAN:  PT FREQUENCY: 2x/week  PT DURATION: 8 weeks  PLANNED INTERVENTIONS: 97110-Therapeutic exercises, 97530- Therapeutic activity, [id number]- Neuromuscular re-education, 97535- Self Care, [id number]- Manual therapy, [phone]- Gait training, [phone]- Aquatic Therapy, [phone]- Electrical stimulation (manual), [id number]- Vasopneumatic device, [id number]- Ultrasound, [id number]-  Ionotophoresis 4mg /ml Dexamethasone , Patient/Family education, Balance training, Stair training, Taping, Dry Needling, Joint mobilization, Spinal mobilization, Scar mobilization, DME instructions, Cryotherapy, Moist heat, and Biofeedback  PLAN FOR NEXT SESSION:  Continue LE strengthening ;standing balance, core strengthening  Keyra Virella B. Minetta Krisher, PT 07/06/24 8:04 PM [facility] [address] [place], [place] [address] Phone # [phone] Fax [phone]

## 2024-07-07 ENCOUNTER — Ambulatory Visit

## 2024-07-07 DIAGNOSIS — R296 Repeated falls: Secondary | ICD-10-CM

## 2024-07-07 DIAGNOSIS — R252 Cramp and spasm: Secondary | ICD-10-CM

## 2024-07-07 DIAGNOSIS — M6281 Muscle weakness (generalized): Secondary | ICD-10-CM

## 2024-07-07 DIAGNOSIS — R29898 Other symptoms and signs involving the musculoskeletal system: Secondary | ICD-10-CM | POA: Diagnosis not present

## 2024-07-07 DIAGNOSIS — R262 Difficulty in walking, not elsewhere classified: Secondary | ICD-10-CM

## 2024-07-07 NOTE — Progress Notes (Deleted)
 NEW REFERRAL TO CPP CLINIC    HPI: Logan Ward is a 43 y.o. male who presents to the [facility] pharmacy clinic for HIV follow-up.  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   Neutropenia (HCC) 06/26/2023   Rash 06/26/2023   Intractable back pain suspect Granix-related 02/21/2023   Lactic acidosis 02/21/2023   Thrombocytopenia (HCC) 02/01/2023   Hyponatremia 01/31/2023   Hypokalemia 01/23/2023   Hematuria 01/11/2023   Neutropenic fever (HCC) 01/10/2023   Pancytopenia (HCC) 01/10/2023   Acute cystitis 01/10/2023   Anemia 01/01/2023   Healthcare maintenance 12/19/2022   Encounter for chemotherapy management 12/12/2022   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HCC) 12/10/2022   Decreased appetite 12/02/2022   Drug-induced constipation 11/21/2022   Nausea without vomiting 11/21/2022   Acute nonintractable headache 11/21/2022   Encounter for antineoplastic chemotherapy 11/18/2022   Burkitt lymphoma of lymph nodes of multiple regions [facility]) 11/14/2022   Counseling regarding advance care planning and goals of care 11/14/2022   Chest mass 11/10/2022   Exposure to chlamydia 10/24/2022   Submental lymphadenopathy 09/05/2022   Goals of care, counseling/discussion 07/26/2022   History of syphilis 07/26/2022   Neck swelling 07/26/2022   AIDS (acquired immune deficiency syndrome) (HCC) 07/25/2022    Patient's Medications  New Prescriptions   No medications on file  Previous Medications   B COMPLEX-C (B-COMPLEX WITH VITAMIN C ) TABLET    Take 1 tablet by mouth daily.   BICTEGRAVIR-EMTRICITABINE -TENOFOVIR  AF (BIKTARVY ) 50-200-25 MG TABS TABLET    Take 1 tablet by mouth daily. Patient needs appt   HYDROCODONE -ACETAMINOPHEN  (NORCO/VICODIN) 5-325 MG TABLET    Take 1 tablet by mouth every 6 (six) hours as needed.   IBUPROFEN  (ADVIL ) 600 MG TABLET    Take 1 tablet (600 mg total) by mouth every 6 (six) hours as needed.   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)    HIV infection (HCC)    Internal hemorrhoids    Obesity    Rectal ulcer     Social History: Social History   Socioeconomic History   Marital status: Significant Other    Spouse name: Not on file   Number of children: Not on file   Years of education: Not on file   Highest education level: Not on file  Occupational History   Not on file  Tobacco Use   Smoking status: Never   Smokeless tobacco: Never  Vaping Use   Vaping status: Never Used  Substance and Sexual Activity   Alcohol use: No   Drug use: No   Sexual activity: Yes    Comment: declined condoms  Other Topics Concern   Not on file  Social History Narrative   Not on file   Social Drivers of Health   Financial Resource Strain: Low Risk  (10/24/2022)   Overall Financial Resource Strain (CARDIA)    Difficulty of Paying Living Expenses: Not hard at all  Food Insecurity: Food Insecurity Present (02/02/2024)   Hunger Vital Sign    Worried About Running Out of Food in the Last Year: Sometimes true    Ran Out of Food in the  Last Year: Sometimes true  Transportation Needs: No Transportation Needs (03/03/2023)   PRAPARE - Administrator, Civil Service (Medical): No    Lack of Transportation (Non-Medical): No  Physical Activity: Sufficiently Active (10/24/2022)   Exercise Vital Sign    Days of Exercise per Week: 3 days    Minutes of Exercise per Session: 60 min  Recent Concern: Physical Activity - Insufficiently Active (10/24/2022)   Exercise Vital Sign    Days of Exercise per Week: 3 days    Minutes of Exercise per Session: 20 min  Stress: No Stress Concern Present (10/24/2022)   [employer] of  Occupational Health - Occupational Stress Questionnaire    Feeling of Stress : Not at all  Social Connections: Unknown (10/24/2022)   Social Connection and Isolation Panel    Frequency of Communication with Friends and Family: Twice a week    Frequency of Social Gatherings with Friends and Family: Twice a week    Attends Religious Services: 1 to 4 times per year    Active Member of Clubs or Organizations: No    Attends Engineer, structural: 1 to 4 times per year    Marital Status: Patient declined    Labs: Lab Results  Component Value Date   HIV1RNAQUANT 105 (H) 06/26/2023   HIV1RNAQUANT 93 (H) 04/08/2023   HIV1RNAQUANT 77 (H) 12/19/2022   CD4TABS 217 (L) 06/26/2023   CD4TABS 127 (L) 04/08/2023   CD4TABS 190 (L) 12/19/2022    RPR and STI Lab Results  Component Value Date   LABRPR REACTIVE (A) 07/22/2023   LABRPR NON REACTIVE 07/23/2022   LABRPR NON REACTIVE 12/09/2019   RPRTITER 1:256 (H) 07/22/2023    STI Results GC CT  07/22/2023  4:36 PM Negative  Negative   07/22/2023  4:35 PM Negative  Negative   01/10/2023  5:53 PM Negative  Negative   10/24/2022 10:13 AM Negative  Negative   07/25/2022  3:19 PM Negative    Negative    Negative  Negative    Negative    Negative   06/07/2022  3:45 PM Negative  Negative   12/03/2021  8:13 AM Negative  Negative   11/19/2020  1:00 AM Negative  Positive     Hepatitis B No results found for: HEPBSAB, HEPBSAG, HEPBCAB Hepatitis C No results found for: HEPCAB, HCVRNAPCRQN Hepatitis A No results found for: HAV Lipids: Lab Results  Component Value Date   CHOL 104 10/21/2022   TRIG 175 (H) 10/21/2022   HDL 31 (L) 10/21/2022   CHOLHDL 3.4 10/21/2022   LDLCALC 48 10/21/2022    Current HIV Regimen: Biktarvy   Assessment: Logan Ward presents to clinic today for HIV follow-up. Last followed with Cathlyn in October 2024. Has been taking Biktarvy  daily without any missed doses or side effects. Last filled with [facility] on  07/05/24. Will refill prescription and check viral load along with lipid panel, CD4 count, and HAV antibody. Confirms he stopped taking Bactrim  in the fall given he stopped therapy for Burkitt's lymphoma and had a CD4 count > 200. Will follow up with Cathlyn in 6 months.   Requested appointment today as he has developed a similar rash as when he was initially diagnosed with syphilis in August 2024. States he has been active with new partners and has not used condoms during oral sex. Will check RPR and urine/oral cytologies today. Will provide treatment for syphilis with doxycycline  100 mg twice daily x 14 days. Explained that the Bicillin  injections are  on short supply right now.   Eligible for multiple vaccines today including Tdap, Menveo, PCV20, Shingles, and HPV vaccines. Per care everywhere, his HBV sAb was reactive on 08/06/22. He declines all vaccines today.   Plan: - Continue Biktarvy  - Check HIV RNA, CD4 count, lipid panel, HAV antibody, RPR, and urine/oral/rectal cytologies - Prescribe doxycycline  100 mg BID x 7 days  - Follow up with Cathlyn on 01/04/25   Alan Geralds, PharmD, CPP, BCIDP, AAHIVP Clinical Pharmacist Practitioner Infectious Diseases Clinical Pharmacist [facility] 07/07/2024, 2:45 PM

## 2024-07-07 NOTE — Therapy (Signed)
 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)    HIV infection (HCC)    Internal hemorrhoids    Obesity    Rectal ulcer    Past Surgical History:  Procedure Laterality Date   IR IMAGING GUIDED PORT INSERTION  11/18/2022   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   Neutropenia (HCC) 06/26/2023   Rash 06/26/2023   Intractable back pain suspect Granix-related 02/21/2023   Lactic acidosis 02/21/2023   Thrombocytopenia (HCC) 02/01/2023   Hyponatremia 01/31/2023   Hypokalemia 01/23/2023   Hematuria 01/11/2023   Neutropenic fever (HCC) 01/10/2023   Pancytopenia (HCC) 01/10/2023   Acute cystitis 01/10/2023   Anemia 01/01/2023   Healthcare maintenance 12/19/2022   Encounter for chemotherapy management 12/12/2022   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HCC) 12/10/2022   Decreased appetite 12/02/2022   Drug-induced constipation 11/21/2022   Nausea without vomiting 11/21/2022   Acute nonintractable headache 11/21/2022   Encounter for antineoplastic chemotherapy 11/18/2022   Burkitt lymphoma of lymph nodes of multiple regions [facility]) 11/14/2022   Counseling regarding advance care planning and goals of care 11/14/2022   Chest mass 11/10/2022   Exposure to chlamydia 10/24/2022   Submental lymphadenopathy 09/05/2022   Goals of care, counseling/discussion 07/26/2022    History of syphilis 07/26/2022   Neck swelling 07/26/2022   AIDS (acquired immune deficiency syndrome) (HCC) 07/25/2022    PCP: [name], MD  REFERRING PROVIDER: [name], MD  REFERRING DIAG: C83.78 (ICD-10-CM) - Burkitt lymphoma of lymph nodes of multiple regions [facility])  THERAPY DIAG:  Muscle weakness (generalized)  Abnormality of gait and mobility  Rationale for Evaluation and Treatment: Rehabilitation  ONSET DATE: 6-7 months  SUBJECTIVE:  SUBJECTIVE STATEMENT: Patient reports he is doing okay today. Pain 4/10 in low back.  Patient reports he fell over the weekend.  Doesn't feel he is ready for DC.    Eval: Rt leg weakness and gives out sometimes, cramps throughout the night and sometimes while walking. Uses SPC and this helps a lot with prevention of falling when it gives out.  No pattern with walking or standing time with weakness.  4-5 falls. Does have numbness and tingling in both legs throughout bil thighs Did finish cancer treatments last year, does still port. Chemo completed. Wasn't very active during chemo with exhausted and feels so weak now.    PAIN: 07/07/2024 Are you having pain? Yes NPRS scale: 6/10 Pain location: low back  PRECAUTIONS: Other: recent cancer     RED FLAGS: None   WEIGHT BEARING RESTRICTIONS: No  FALLS:  Has patient fallen in last 6 months? Yes. Number of falls 5 (most recent Sunday)  03/23/24: No falls in the past 4 weeks  07/07/24: [name] on 07/03/24 going up steps, states right leg gave out  LIVING ENVIRONMENT: Lives with: lives with their family Lives in: House/apartment Stairs: No Has following equipment at home: Single point cane, FWW  OCCUPATION: off work right now, CNA  PLOF: Independent  PATIENT GOALS: to feel stronger  and get back to how I was before cancer  PERTINENT HISTORY:  HIV/AIDs; syphilis, rapidly growing right cervical nodal mass extending into the level 2 nodal station of the right supraclavicular nodal station and extension into the superficial right sternocleidomastoid muscle.  There is also hypermetabolic right axillary nodal disease.diagnosed in November/December 2023 Sexual abuse: NO  NEXT MD VISIT: 04/05/24 - oncologist  OBJECTIVE:  Note: Objective measures were completed at Evaluation unless otherwise noted.  DIAGNOSTIC FINDINGS:   Xray - hip 01/27/24 Mild degenerative changes to the hip joint  CT NECK WITH CONTRAST 12/28/23  IMPRESSION: 1. Resolution of the large indistinctly marginated mass lesion throughout the anterior lower neck, previously more extensive on the right than the left. Mild scarring of the right sternocleidomastoid muscle but no evidence of recurrent mass lesion. 2. Stable appearance of level 1 lymph nodes, the largest showing short axis dimension of 1 cm. Previously seen mass/adenopathy in the right level 2 region has resolved without residual. Other cervical chain lymph nodes on both sides of the neck are within normal limits in size.   PATIENT SURVEYS:  Initial eval: LEFS Lower Extremity Functional Score: 22 / 80 = 27.5 % indicative of moderate functional limitation   03/23/24: LEFS Lower Extremity Functional Score: 12 / 80 = 15 % indicative of severe functional limitation  (Patient explains he paid more attention to the details of the questions)  04/20/2024 LEFS: 47/80= 58.8%  04/20/2024 LEFS: 56/80= 70.0%  COGNITION: Overall cognitive status: Within functional limits for tasks assessed     SENSATION: Numbness in both thighs worse with pain, can feel pressure but light touch   POSTURE: rounded shoulders and anterior pelvic tilt  PALPATION: TTP at Rt greater trochanter, tightness noted in Rt hip flexor, Rt lumbar paraspinal  LOWER EXTREMITY  ROM:  WFL without pain for full flexion however at end range extension pt reports pain at proximal lateral hip on Rt  LOWER EXTREMITY MMT:  MMT Right Eval */5 Right  03/23/24 Right 05/18/24 Right 07/07/24 Left eval Left 03/23/24 Left  05/18/24 Left 07/07/24  Hip flexion 3 3 3+ 4- 4+ 4+ 4+ 4+  Hip extension 3+ 3+ 4 4 4+  4+ 4+ 4+  Hip abduction 3+ 4+ 4+ 4+ 4 5 5 5   Hip adduction 3+ 4+ 4+ 4+ 4+ 4+ 4+ 5  Hip internal rotation          Hip external rotation          Knee flexion 3+ 4 4+ 4+ 4 4+ 4+ 4+  Knee extension 3 3+ 4- 4+ 4 4+ 4+ 4+  Ankle dorsiflexion 3+ 4- 4 4+ 4+ 4+ 4+ 4+  Ankle plantarflexion          Ankle inversion          Ankle eversion           (Blank rows = not tested)   FUNCTIONAL TESTS:  Initial eval: 5 times sit to stand: 24.06s with use of hands and felt 7/10 on BORG with this  03/23/24: 5 times sit to stand: 17.85 s with use of hands and felt 3/10 on BORG with this  05/18/24: 5 times sit to stand: 12.87 s with use of hands and felt 3/10 on BORG with this 3 MWT: 294.1 ft  07/07/24: 5 times sit to stand: 16.04 s with minimal use of hands no armrests 3 MWT: 400.9 ft   GAIT: Distance walked: 271' (completed last session) Assistive device utilized: Single point cane Level of assistance: Modified independence Comments: greatly decreased cadence, decreased step length and height at Rt leg                                                                                                                                TREATMENT DATE:  07/07/24  Treadmill x 5 min at 1/5 mph Re-assessment completed: see above Walking on treadmill and walking for 3 MWT : emphasized heel to toe progression and proper gait sequence.  Sit to stand  x 5 to assess need for push off Education on exercise plan outside of therapy visits and how critical it is to be diligent with this to successfully meet goals.   07/06/24  Nustep x 10 min level 5 Leg Pres (seat 9) 90# 2 x 10;  unilateral 50#  x 10 bilateral  Sit to stand + chest press 15# x 10 in front of mirror for visual feedback Modified Deadlift 15# 2 x 10 in front of mirror for visual feedback Unilateral 15# farmer's carry x 2 laps from ortho gym to end of hall (switching hands at end of hall) Seated LAQ x 20 each LE with 6 lbs Seated biceps curls 7# 2 x 10 Seated alternating chest press 7# 2 x 10 bilateral  Sit to stand 2 x 10 in tandem with left leg back then 2 x 10 with right leg back  06/21/24  Sit to stand + chest press 15# x 10 Modified Deadlift 15# x 10  Unilateral 15# farmer's carry x 2 laps around cancer gym (switching hands) Leg Pres (seat 9) 90# 2 x 10;  unilateral 50#  x 10 bilateral  Treadmill x 12 min increasing to 2.0 mph Seated alternating chest press 7# x 10 bilateral  Seated biceps curls 7# x 12  PATIENT EDUCATION:  Education details: [email or web address] Person educated: Patient Education method: Explanation, Demonstration, Tactile cues, Verbal cues, and Handouts Education comprehension: verbalized understanding, returned demonstration, verbal cues required, tactile cues required, and needs further education  HOME EXERCISE PROGRAM: Access Code: [email or web address] URL: [email or web address] Date: 02/03/2024 Prepared by: Mliss  Exercises - Supine March  - 1 x daily - 7 x weekly - 2 sets - 10 reps - Bent Knee Fallouts  - 1 x daily - 7 x weekly - 2 sets - 10 reps - Supine Hip Adductor Squeeze with Small Ball  - 1 x daily - 7 x weekly - 2 sets - 10 reps - Sit to Stand  - 3 x daily - 7 x weekly - 1-2 sets - 5-10 reps - Hooklying Clamshell with Resistance  - 1 x daily - 3 x weekly - 2 sets - 10 reps  ASSESSMENT:  CLINICAL IMPRESSION: Arley demonstrates improvement in most objective findings. He is no longer walking with a cane.  He tolerated increased distance today with no a.d.  His sit to stand score was lower but he typically uses the armrests on the chair to push up and he was able to do the test today  without the armrests.  His strength is greatly improved.  Unsure of how the right LE is still having instability moments.  None have been witnessed here in clinic.   He feels he needs to continue PT.  We had a lengthy discussion about his low motivation to carry through with an exercise program outside of PT.  He feels he is doing better but he admits he has never really been one to exercise.  He has not been released by MD to return to work but will likely discuss this at his next appt.  We will request date extension to complete the visits given at last auth.  Patient will likely be discharged at end of that if approved.       Eval: Patient is a 43 y.o. male  who was seen today for physical therapy evaluation and treatment for Rt leg weakness. Pt has complex history of Burkitt lymphoma with chemo treatments now completed. Pt reports he was exhausted during his treatments and feels like he is very weak and fatigued now. Does have numbness in bil thighs only posterior and anteriorly and feels like his Rt leg gives out to the point of him having 5 falls in the last several months. Pt does demonstrate gait deficits, very slow cadence and heavy trunk lean onto [facility] which pt uses on RT hand reporting he has more able to catch himself with the cane him this hand if his knee gives. Pt found to have decreased strength bil LE but worse in Rt leg. Pt demonstrated high fall risk based on 5xSTS and after completing this was very fatigued and needed several minutes to recover stating 7/10 on BORG scale. LEFS score 22 / 80. 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   OBJECTIVE IMPAIRMENTS: Abnormal gait, decreased activity tolerance, decreased balance, decreased coordination, decreased endurance, decreased knowledge of use of DME, decreased mobility, difficulty walking, decreased strength, increased fascial restrictions, impaired  perceived functional ability, increased muscle spasms, impaired flexibility, impaired sensation, improper body  mechanics, postural dysfunction, and pain.   ACTIVITY LIMITATIONS: carrying, lifting, bending, sitting, standing, squatting, stairs, transfers, bed mobility, and locomotion level  PARTICIPATION LIMITATIONS: cleaning, laundry, interpersonal relationship, driving, shopping, community activity, occupation, and yard work  PERSONAL FACTORS: Fitness, Time since onset of injury/illness/exacerbation, and 1 comorbidity: medical history  are also affecting patient's functional outcome.   REHAB POTENTIAL: Good  CLINICAL DECISION MAKING: Evolving/moderate complexity  EVALUATION COMPLEXITY: Moderate   GOALS: Goals reviewed with patient? Yes  SHORT TERM GOALS: Target date: 02/25/24 Pt to be I with HEP.  Baseline: Goal status: In progress (compliance is poor)  2.  Pt to report improved LEFS score of 30 to improve toward lowering functional impairment limitations.  Baseline:  Goal status: MET 04/20/2024  3.  Pt to demonstrate ability to complete body weight squats without hands for 10 reps to improve LE strength for tolerance for walking.  Baseline:  Goal status: MET 03/23/24  4.  Pt to demonstrate improved gait mechanics with even step length and height to be able to decreased fall risk with community ambulation.   Baseline:  Goal status: MET 03/23/24   LONG TERM GOALS: Target date: 05/18/24  Pt to be I with advanced HEP.  Baseline:  Goal status: MET 05/18/24  2.  Pt to demonstrate 10# squats without compensatory strategies for improved hip strength for tolerance to return to work Baseline:  Goal status: In Progress  3.  Pt to report ability to walk with or without AD for at least 30 mins with no increase in pain for tolerance to return to work.  Baseline:  Goal status: In Progress  4.  Pt to demonstrate 5xSTS in 7s to achieve age related norm for decreased fall risk Baseline:   Goal status: In Progress  5.  Pt to demonstrate at least 4+/5 bil hip strength for improved pelvic stability and functional squats without leakage.  Baseline:  Goal status: In Progress   PLAN:  PT FREQUENCY: 2x/week  PT DURATION: 8 weeks  PLANNED INTERVENTIONS: 97110-Therapeutic exercises, 97530- Therapeutic activity, [id number]- Neuromuscular re-education, 97535- Self Care, [id number]- Manual therapy, [phone]- Gait training, [phone]- Aquatic Therapy, [phone]- Electrical stimulation (manual), [id number]- Vasopneumatic device, [id number]- Ultrasound, [id number]- Ionotophoresis 4mg /ml Dexamethasone , Patient/Family education, Balance training, Stair training, Taping, Dry Needling, Joint mobilization, Spinal mobilization, Scar mobilization, DME instructions, Cryotherapy, Moist heat, and Biofeedback  PLAN FOR NEXT SESSION:  Continue LE strengthening ;standing balance, core strengthening, gait training, functional and return to work simulated tasks  [name] B. Ceili Boshers, PT 07/07/24 4:14 PM [facility] [address] [place], [place] [address] Phone # [phone] Fax [phone]

## 2024-07-08 ENCOUNTER — Ambulatory Visit: Admitting: Physical Therapy

## 2024-07-08 ENCOUNTER — Other Ambulatory Visit: Payer: Self-pay

## 2024-07-08 ENCOUNTER — Other Ambulatory Visit (HOSPITAL_COMMUNITY): Payer: Self-pay

## 2024-07-08 ENCOUNTER — Other Ambulatory Visit (HOSPITAL_COMMUNITY)
Admission: RE | Admit: 2024-07-08 | Discharge: 2024-07-08 | Disposition: A | Discharge status: Home / Self Care | Source: Ambulatory Visit | Attending: Family | Admitting: Family

## 2024-07-08 ENCOUNTER — Ambulatory Visit (INDEPENDENT_AMBULATORY_CARE_PROVIDER_SITE_OTHER): Admitting: Pharmacist

## 2024-07-08 DIAGNOSIS — Z113 Encounter for screening for infections with a predominantly sexual mode of transmission: Secondary | ICD-10-CM | POA: Insufficient documentation

## 2024-07-08 DIAGNOSIS — B20 Human immunodeficiency virus [HIV] disease: Secondary | ICD-10-CM

## 2024-07-08 MED ORDER — DOXYCYCLINE HYCLATE 100 MG PO TABS
100.0000 mg | ORAL_TABLET | Freq: Two times a day (BID) | ORAL | 0 refills | Status: AC
  Filled 2024-07-08: qty 28, 14d supply, fill #0

## 2024-07-08 MED ORDER — BIKTARVY 50-200-25 MG PO TABS
1.0000 | ORAL_TABLET | Freq: Every day | ORAL | 5 refills | Status: DC
  Filled 2024-07-08 – 2024-07-28 (×3): qty 30, 30d supply, fill #0
  Filled 2024-08-27: qty 30, 30d supply, fill #1
  Filled 2024-09-29: qty 30, 30d supply, fill #2
  Filled 2024-10-27: qty 30, 30d supply, fill #3
  Filled 2024-11-24: qty 30, 30d supply, fill #4

## 2024-07-09 ENCOUNTER — Other Ambulatory Visit: Payer: Self-pay

## 2024-07-09 LAB — T-HELPER CELLS (CD4) COUNT (NOT AT ARMC)
CD4 % Helper T Cell: 14 % — ABNORMAL LOW (ref 33–65)
CD4 T Cell Abs: 297 /uL — ABNORMAL LOW (ref 400–1790)

## 2024-07-09 LAB — URINE CYTOLOGY ANCILLARY ONLY
Chlamydia: NEGATIVE
Comment: NEGATIVE
Comment: NORMAL
Neisseria Gonorrhea: NEGATIVE

## 2024-07-09 LAB — CYTOLOGY, (ORAL, ANAL, URETHRAL) ANCILLARY ONLY
Chlamydia: NEGATIVE
Comment: NEGATIVE
Comment: NORMAL
Neisseria Gonorrhea: NEGATIVE

## 2024-07-12 ENCOUNTER — Inpatient Hospital Stay: Attending: Hematology

## 2024-07-12 DIAGNOSIS — C8371 Burkitt lymphoma, lymph nodes of head, face, and neck: Secondary | ICD-10-CM | POA: Diagnosis present

## 2024-07-12 DIAGNOSIS — Z452 Encounter for adjustment and management of vascular access device: Secondary | ICD-10-CM | POA: Diagnosis present

## 2024-07-12 DIAGNOSIS — B20 Human immunodeficiency virus [HIV] disease: Secondary | ICD-10-CM | POA: Insufficient documentation

## 2024-07-12 DIAGNOSIS — C8378 Burkitt lymphoma, lymph nodes of multiple sites: Secondary | ICD-10-CM

## 2024-07-12 LAB — CBC WITH DIFFERENTIAL (CANCER CENTER ONLY)
Abs Immature Granulocytes: 0.01 K/uL (ref 0.00–0.07)
Basophils Absolute: 0 K/uL (ref 0.0–0.1)
Basophils Relative: 0 %
Eosinophils Absolute: 0 K/uL (ref 0.0–0.5)
Eosinophils Relative: 1 %
HCT: 38.7 % — ABNORMAL LOW (ref 39.0–52.0)
Hemoglobin: 12.4 g/dL — ABNORMAL LOW (ref 13.0–17.0)
Immature Granulocytes: 0 %
Lymphocytes Relative: 73 %
Lymphs Abs: 3.1 K/uL (ref 0.7–4.0)
MCH: 28 pg (ref 26.0–34.0)
MCHC: 32 g/dL (ref 30.0–36.0)
MCV: 87.4 fL (ref 80.0–100.0)
Monocytes Absolute: 0.3 K/uL (ref 0.1–1.0)
Monocytes Relative: 7 %
Neutro Abs: 0.8 K/uL — ABNORMAL LOW (ref 1.7–7.7)
Neutrophils Relative %: 19 %
Platelet Count: 230 K/uL (ref 150–400)
RBC: 4.43 MIL/uL (ref 4.22–5.81)
RDW: 12.9 % (ref 11.5–15.5)
Smear Review: NORMAL
WBC Count: 4.2 K/uL (ref 4.0–10.5)
nRBC: 0 % (ref 0.0–0.2)

## 2024-07-12 LAB — CMP (CANCER CENTER ONLY)
ALT: 18 U/L (ref 0–44)
AST: 17 U/L (ref 15–41)
Albumin: 4.2 g/dL (ref 3.5–5.0)
Alkaline Phosphatase: 64 U/L (ref 38–126)
Anion gap: 4 — ABNORMAL LOW (ref 5–15)
BUN: 13 mg/dL (ref 6–20)
CO2: 27 mmol/L (ref 22–32)
Calcium: 9.2 mg/dL (ref 8.9–10.3)
Chloride: 108 mmol/L (ref 98–111)
Creatinine: 1.01 mg/dL (ref 0.61–1.24)
GFR, Estimated: >60 mL/min (ref >60)
Glucose, Bld: 93 mg/dL (ref 70–99)
Potassium: 4.4 mmol/L (ref 3.5–5.1)
Sodium: 139 mmol/L (ref 135–145)
Total Bilirubin: 0.7 mg/dL (ref 0.0–1.2)
Total Protein: 8.1 g/dL (ref 6.5–8.1)

## 2024-07-12 MED ORDER — HEPARIN SOD (PORK) LOCK FLUSH 100 UNIT/ML IV SOLN
500.0000 [IU] | Freq: Once | INTRAVENOUS | Status: AC
  Administered 2024-07-12: 500 [IU]

## 2024-07-12 MED ORDER — SODIUM CHLORIDE 0.9% FLUSH
10.0000 mL | Freq: Once | INTRAVENOUS | Status: AC
Start: 2024-07-12 — End: 2024-07-12
  Administered 2024-07-12: 10 mL

## 2024-07-13 ENCOUNTER — Encounter: Admitting: Physical Therapy

## 2024-07-14 LAB — LIPID PANEL
Cholesterol: 95 mg/dL (ref <200)
HDL: 35 mg/dL — ABNORMAL LOW (ref >=40)
LDL Cholesterol (Calc): 43 mg/dL
Non-HDL Cholesterol (Calc): 60 mg/dL (ref <130)
Total CHOL/HDL Ratio: 2.7 (calc) (ref <5.0)
Triglycerides: 86 mg/dL (ref <150)

## 2024-07-14 LAB — T PALLIDUM AB: T Pallidum Abs: POSITIVE — AB

## 2024-07-14 LAB — HIV-1 RNA QUANT-NO REFLEX-BLD
HIV 1 RNA Quant: 74 {copies}/mL — ABNORMAL HIGH
HIV-1 RNA Quant, Log: 1.87 {Log_copies}/mL — ABNORMAL HIGH

## 2024-07-14 LAB — RPR TITER: RPR Titer: 1:128 {titer} — ABNORMAL HIGH

## 2024-07-14 LAB — HEPATITIS A ANTIBODY, TOTAL: Hepatitis A AB,Total: REACTIVE — AB

## 2024-07-14 LAB — RPR: RPR Ser Ql: REACTIVE — AB

## 2024-07-15 NOTE — Progress Notes (Signed)
 NEW REFERRAL TO CPP CLINIC      HPI: Logan Ward is a 43 y.o. male who presents to the [facility] pharmacy clinic for HIV follow-up.       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   Neutropenia (HCC) 06/26/2023   Rash 06/26/2023   Intractable back pain suspect Granix-related 02/21/2023   Lactic acidosis 02/21/2023   Thrombocytopenia (HCC) 02/01/2023   Hyponatremia 01/31/2023   Hypokalemia 01/23/2023   Hematuria 01/11/2023   Neutropenic fever (HCC) 01/10/2023   Pancytopenia (HCC) 01/10/2023   Acute cystitis 01/10/2023   Anemia 01/01/2023   Healthcare maintenance 12/19/2022   Encounter for chemotherapy management 12/12/2022   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HCC) 12/10/2022   Decreased appetite 12/02/2022   Drug-induced constipation 11/21/2022   Nausea without vomiting 11/21/2022   Acute nonintractable headache 11/21/2022   Encounter for antineoplastic chemotherapy 11/18/2022   Burkitt lymphoma of lymph nodes of multiple regions [facility]) 11/14/2022   Counseling regarding advance care planning and goals of care 11/14/2022   Chest mass 11/10/2022   Exposure to chlamydia 10/24/2022   Submental lymphadenopathy 09/05/2022   Goals of care, counseling/discussion 07/26/2022   History of syphilis 07/26/2022   Neck swelling 07/26/2022   AIDS (acquired immune deficiency syndrome) (HCC) 07/25/2022          Patient's Medications  New Prescriptions    No medications on file  Previous Medications    B COMPLEX-C (B-COMPLEX WITH VITAMIN C ) TABLET    Take 1 tablet by mouth daily.    BICTEGRAVIR-EMTRICITABINE -TENOFOVIR  AF (BIKTARVY ) 50-200-25 MG TABS TABLET    Take 1 tablet by mouth daily. Patient needs appt    HYDROCODONE -ACETAMINOPHEN  (NORCO/VICODIN) 5-325 MG TABLET    Take 1 tablet by mouth every 6 (six) hours as needed.    IBUPROFEN  (ADVIL ) 600 MG TABLET    Take 1 tablet (600 mg total) by mouth every 6 (six) hours as needed.    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)     HIV infection (HCC)     Internal hemorrhoids     Obesity     Rectal ulcer            Social History: Social History         Socioeconomic History   Marital status: Significant Other      Spouse name: Not on file   Number of children: Not on file   Years of education: Not on file   Highest education level: Not on file  Occupational History   Not on file  Tobacco Use   Smoking status: Never   Smokeless tobacco: Never  Vaping Use   Vaping status: Never Used  Substance and Sexual Activity   Alcohol use: No   Drug use: No   Sexual activity: Yes      Comment: declined condoms  Other Topics Concern   Not on file  Social History Narrative   Not on file    Social Drivers of Health        Financial  Resource Strain: Low Risk  (10/24/2022)    Overall Financial Resource Strain (CARDIA)     Difficulty of Paying Living Expenses: Not hard at all  Food Insecurity: Food Insecurity Present (02/02/2024)    Hunger Vital Sign     Worried About Running Out of Food in the Last Year: Sometimes true     Ran Out of Food in the Last Year: Sometimes true  Transportation Needs: No Transportation Needs (03/03/2023)    PRAPARE - Therapist, art (Medical): No     Lack of Transportation (Non-Medical): No  Physical Activity: Sufficiently Active (10/24/2022)    Exercise Vital Sign     Days of Exercise per Week: 3 days     Minutes of Exercise per Session: 60 min  Recent Concern: Physical Activity - Insufficiently Active (10/24/2022)    Exercise Vital Sign     Days of Exercise per Week: 3 days     Minutes of Exercise per  Session: 20 min  Stress: No Stress Concern Present (10/24/2022)    [employer] of Occupational Health - Occupational Stress Questionnaire     Feeling of Stress : Not at all  Social Connections: Unknown (10/24/2022)    Social Connection and Isolation Panel     Frequency of Communication with Friends and Family: Twice a week     Frequency of Social Gatherings with Friends and Family: Twice a week     Attends Religious Services: 1 to 4 times per year     Active Member of Clubs or Organizations: No     Attends Engineer, structural: 1 to 4 times per year     Marital Status: Patient declined      Labs:      Lab Results  Component Value Date    HIV1RNAQUANT 105 (H) 06/26/2023    HIV1RNAQUANT 93 (H) 04/08/2023    HIV1RNAQUANT 77 (H) 12/19/2022    CD4TABS 217 (L) 06/26/2023    CD4TABS 127 (L) 04/08/2023    CD4TABS 190 (L) 12/19/2022      RPR and STI      Lab Results  Component Value Date    LABRPR REACTIVE (A) 07/22/2023    LABRPR NON REACTIVE 07/23/2022    LABRPR NON REACTIVE 12/09/2019    RPRTITER 1:256 (H) 07/22/2023      STI Results GC CT  07/22/2023  4:36 PM Negative  Negative   07/22/2023  4:35 PM Negative  Negative   01/10/2023  5:53 PM Negative  Negative   10/24/2022 10:13 AM Negative  Negative   07/25/2022  3:19 PM Negative    Negative    Negative  Negative    Negative    Negative   06/07/2022  3:45 PM Negative  Negative   12/03/2021  8:13 AM Negative  Negative   11/19/2020  1:00 AM Negative  Positive       Hepatitis B No results found for: HEPBSAB, HEPBSAG, HEPBCAB Hepatitis C No results found for: HEPCAB, HCVRNAPCRQN Hepatitis A No results found for: HAV Lipids:      Lab Results  Component Value Date    CHOL 104 10/21/2022    TRIG 175 (H) 10/21/2022    HDL 31 (L) 10/21/2022    CHOLHDL 3.4 10/21/2022    LDLCALC 48 10/21/2022      Current HIV Regimen: Biktarvy    Assessment: Arul presents to clinic today for HIV  follow-up. Last followed with Cathlyn in October 2024. Has been taking Biktarvy  daily  without any missed doses or side effects. Last filled with [facility] on 07/05/24. Will refill prescription and check viral load along with lipid panel, CD4 count, and HAV antibody. Confirms he stopped taking Bactrim  in the fall given he stopped therapy for Burkitt's lymphoma and had a CD4 count > 200. Will follow up with Cathlyn in 6 months.    Requested appointment today as he has developed a similar rash as when he was initially diagnosed with syphilis in August 2024. States he has been active with new partners and has not used condoms during oral sex. Will check RPR and urine/oral cytologies today. Will provide treatment for syphilis with doxycycline  100 mg twice daily x 14 days. Explained that the Bicillin  injections are on short supply right now.    Eligible for multiple vaccines today including Tdap, Menveo, PCV20, Shingles, and HPV vaccines. Per care everywhere, his HBV sAb was reactive on 08/06/22. He declines all vaccines today.    Plan: - Continue Biktarvy  - Check HIV RNA, CD4 count, lipid panel, HAV antibody, RPR, and urine/oral/rectal cytologies - Prescribe doxycycline  100 mg BID x 14 days  - Follow up with Cathlyn on 01/04/25    Alan Geralds, PharmD, CPP, BCIDP, AAHIVP Clinical Pharmacist Practitioner Infectious Diseases Clinical Pharmacist [facility] 07/07/2024, 2:45 PM

## 2024-07-18 NOTE — Progress Notes (Incomplete)
 [facility] OFFICE CLINIC NOTE  Date of service. 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  The patient presented with a rapidly growing right cervical nodal mass extending into the level 2 nodal station of the right supraclavicular nodal station and extension into the superficial right sternocleidomastoid muscle.  There is also hypermetabolic right axillary nodal disease.  No evidence of spleen or solid organ involvement.  No evidence of marrow involvement.  The patient was diagnosed in November/December 2023  HISTORY OF PRESENTING ILLNESS:  Logan Ward is a wonderful 43 y.o. male who has been referred to us  by Dr Cheryle MD for evaluation and management of newly diagnosed Burkitt's lymphoma in the setting of known history of HIV/AIDS.   Patient has a history of HIV/AIDS [last CD4 count on 10/21/2022 of 501 and viral load of 324 copies per mL, acquired through by sexual contact, diagnosed 3 to 4 months ago, follows with Cathlyn Calone], syphilis, obesity presented to the hospital on 11/10/2022 with rapidly growing right anterior chest wall mass.   CT of the neck on 11/10/2022 showed large infiltrative centrally necrotic soft tissue mass throughout the right neck and extending to the upper chest anterior to the clavicle measuring 8.1 x 5.9 x 7.4 cm in size.  There are numerous additional prominent right sided cervical chain lymph nodes measuring 1 cm at level 2A and 1.1 cm in the posterior triangle. CT chest abdomen pelvis done on 11/10/2022 showed enlarged right lower cervical, supraclavicular and bilaterally axillary lymph nodes.  Largest right axillary lymph node measuring 2.2 x 1.7 cm.  Diffuse fat stranding throughout the superior mediastinum.  Prominent subcentimeter retroperitoneal and iliac lymph nodes.  Diffuse retroperitoneal fat stranding of uncertain significance.  No evidence of mass or evidence of organ  metastatic disease in the abdomen or pelvis.  Patient had an ultrasound-guided core needle biopsy of the right neck mass on 11/12/2022 which shows B-cell non-Hodgkin's lymphoma with high-grade features and findings consistent with Burkitt's lymphoma.    PRIOR THERAPY: EPOCH-R x 6 cycles   INTERVAL HISTORY:  Logan Ward 43 y.o. male with Burkitt's lymphoma who is being followed-up for toxicity check.  Patient was last seen by me on 01/05/2024 and complained of right upper leg weakness when waking, bilateral hand neuropathy with hot sensation, throbbing headache once a week, chronic mild SOB, and an episode of knee giving out 2 weeks prior to the visit. He also noted having a sore throat and cervical enlarged lymph nodes 2 weeks prior to the visit. His lymph nodes had shrunk in size at the time of the visit. He complained of mild occasional swallowing issues with some food.     MEDICAL HISTORY: 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)    HIV infection (HCC)    Internal hemorrhoids    Obesity    Rectal ulcer     ALLERGIES:  has no known allergies.  MEDICATIONS:  Current Outpatient Medications  Medication Sig Dispense Refill   B Complex-C (B-COMPLEX WITH VITAMIN C ) tablet Take 1 tablet by mouth daily. 30 tablet 11   bictegravir-emtricitabine -tenofovir  AF (BIKTARVY ) 50-200-25 MG TABS tablet Take 1 tablet by mouth daily. Patient needs appt 30 tablet 5   doxycycline  (VIBRA -TABS) 100 MG tablet Take 1 tablet (100 mg total) by mouth 2 (two) times daily for 14 days. 28  tablet 0   HYDROcodone -acetaminophen  (NORCO/VICODIN) 5-325 MG tablet Take 1 tablet by mouth every 6 (six) hours as needed. 12 tablet 0   ibuprofen  (ADVIL ) 600 MG tablet Take 1 tablet (600 mg total) by mouth every 6 (six) hours as needed. 30 tablet 0   ketoconazole  (NIZORAL ) 2 % cream Apply topically 2 times daily for 2 weeks (Patient not taking:  Reported on 09/26/2023) 60 g 1   No current facility-administered medications for this visit.    SURGICAL HISTORY:  Past Surgical History:  Procedure Laterality Date   IR IMAGING GUIDED PORT INSERTION  11/18/2022    REVIEW OF SYSTEMS:    10 Point review of Systems was done is negative except as noted above.   PHYSICAL EXAMINATION: PHONE VISIT .There were no vitals taken for this visit.  GENERAL:alert, in no acute distress and comfortable SKIN: no acute rashes, no significant lesions EYES: conjunctiva are pink and non-injected, sclera anicteric OROPHARYNX: MMM, no exudates, no oropharyngeal erythema or ulceration NECK: supple, no JVD LYMPH:  no palpable lymphadenopathy in the cervical, axillary or inguinal regions LUNGS: clear to auscultation b/l with normal respiratory effort HEART: regular rate & rhythm ABDOMEN:  normoactive bowel sounds , non tender, not distended. Extremity: no pedal edema PSYCH: alert & oriented x 3 with fluent speech NEURO: no focal motor/sensory deficits   ECOG PERFORMANCE STATUS: 1   LABORATORY DATA:  .SABRA    Latest Ref Rng & Units 07/12/2024   10:50 AM 01/05/2024   12:58 PM 11/03/2023   12:38 PM  CBC  WBC 4.0 - 10.5 K/uL 4.2  4.4  3.2   Hemoglobin 13.0 - 17.0 g/dL 87.5  87.2  87.6   Hematocrit 39.0 - 52.0 % 38.7  38.8  38.1   Platelets 150 - 400 K/uL 230  145  168   ANC 400 .    Latest Ref Rng & Units 07/12/2024   10:50 AM 01/05/2024   12:58 PM 11/03/2023   12:38 PM  CMP  Glucose 70 - 99 mg/dL 93  83  97   BUN 6 - 20 mg/dL 13  11  9    Creatinine 0.61 - 1.24 mg/dL 8.98  9.00  8.95   Sodium 135 - 145 mmol/L 139  137  138   Potassium 3.5 - 5.1 mmol/L 4.4  4.0  4.0   Chloride 98 - 111 mmol/L 108  106  104   CO2 22 - 32 mmol/L 27  27  28    Calcium 8.9 - 10.3 mg/dL 9.2  9.1  9.5   Total Protein 6.5 - 8.1 g/dL 8.1  7.3  7.6   Total Bilirubin 0.0 - 1.2 mg/dL 0.7  1.3  1.2   Alkaline Phos 38 - 126 U/L 64  80  85   AST 15 - 41 U/L 17  14  19     ALT 0 - 44 U/L 18  15  18       RADIOGRAPHIC STUDIES:  No results found.    ASSESSMENT/PLAN:   43 year old male with HIV/AIDS with newly diagnosed Burkitt's lymphoma   #1  Stage II recently diagnosed Burkitt's lymphoma Presenting with rapidly growing right neck mass extending into the chest, bilateral x-ray lymphadenopathy and also concern for possible involvement of retroperitoneal lymph nodes. Concerning for at least stage II possibly stage III disease. Some borderline lymphadenopathy in the abdomen could also be from his recently diagnosed HIV/AIDS. No clinical symptoms suggestive of CNS involvement at this time. Brain MRI negative.  No constitutional symptoms. No significant cytopenias to suggest bone marrow involvement. Discussed with patient and he is not interested in fertility preservation or sperm banking. First presented in August at [facility] and was treated empirically with steroids and antibiotics with improvement in swelling prior to additional progression. -Currently on dose adjusted EPOCH-R. Status post 1 cycle. Tolerated well overall.  -Scheduled for cycle #2 on 12/10/22. Will help facilitate inpatient admission. -Outpatient follow up on 12/17/22 for Rituxan  and Neulasta    #2 HIV/AIDS -Following Dr. Calone from ID and currently taking Biktarvy .   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  Patient reports this was completely treated. Recent with secondary syphilis   #4 Prophylaxis: Continue salt water  and baking soda mouthwash rinses 4 times a day to reduce mucositis.  MiraLAX  and senna as for constipation Bactrim  for prophylactic PJP.    PLAN:  -Discussed lab results from today, 07/19/2024, in detail with patient   FOLLOW-UP: ***  The total time spent in the appointment was *** minutes* .  All of the patient's questions were answered with apparent satisfaction. The patient knows to call the clinic with any problems, questions or  concerns.   Emaline Saran MD MS AAHIVMS [facility] Hematology/Oncology Physician [facility]  .*Total Encounter Time as defined by the Centers for Medicare and Medicaid Services includes, in addition to the face-to-face time of a patient visit (documented in the note above) non-face-to-face time: obtaining and reviewing outside history, ordering and reviewing medications, tests or procedures, care coordination (communications with other health care professionals or caregivers) and documentation in the medical record.    I,Mitra Faeizi,acting as a Neurosurgeon for Emaline Saran, MD.,have documented all relevant documentation on the behalf of Emaline Saran, MD,as directed by  Emaline Saran, MD while in the presence of Emaline Saran, MD.  ***

## 2024-07-19 ENCOUNTER — Inpatient Hospital Stay: Attending: Hematology | Admitting: Hematology

## 2024-07-19 DIAGNOSIS — Z79624 Long term (current) use of inhibitors of nucleotide synthesis: Secondary | ICD-10-CM | POA: Insufficient documentation

## 2024-07-19 DIAGNOSIS — B20 Human immunodeficiency virus [HIV] disease: Secondary | ICD-10-CM | POA: Insufficient documentation

## 2024-07-19 DIAGNOSIS — C8370 Burkitt lymphoma, unspecified site: Secondary | ICD-10-CM | POA: Insufficient documentation

## 2024-07-20 ENCOUNTER — Encounter: Payer: Self-pay | Admitting: Hematology

## 2024-07-28 ENCOUNTER — Other Ambulatory Visit: Payer: Self-pay

## 2024-07-28 NOTE — Progress Notes (Signed)
 Specialty Pharmacy Refill Coordination Note  Logan Ward is a 43 y.o. male contacted today regarding refills of specialty medication(s) Bictegravir-Emtricitab-Tenofov (Biktarvy )   Patient requested Delivery   Delivery date: 08/03/24   Verified address: [address]  [place]   Medication will be filled on 08/02/24.

## 2024-08-02 ENCOUNTER — Other Ambulatory Visit: Payer: Self-pay

## 2024-08-02 ENCOUNTER — Inpatient Hospital Stay (HOSPITAL_BASED_OUTPATIENT_CLINIC_OR_DEPARTMENT_OTHER): Admitting: Hematology

## 2024-08-02 ENCOUNTER — Encounter: Payer: Self-pay | Admitting: Hematology

## 2024-08-02 VITALS — BP 121/75 | HR 59 | Temp 97.5°F | Resp 20 | Wt 297.0 lb

## 2024-08-02 DIAGNOSIS — C8378 Burkitt lymphoma, lymph nodes of multiple sites: Secondary | ICD-10-CM | POA: Diagnosis not present

## 2024-08-02 DIAGNOSIS — C8371 Burkitt lymphoma, lymph nodes of head, face, and neck: Secondary | ICD-10-CM | POA: Diagnosis present

## 2024-08-02 DIAGNOSIS — C8370 Burkitt lymphoma, unspecified site: Secondary | ICD-10-CM | POA: Diagnosis not present

## 2024-08-02 DIAGNOSIS — B20 Human immunodeficiency virus [HIV] disease: Secondary | ICD-10-CM | POA: Diagnosis present

## 2024-08-02 DIAGNOSIS — Z79624 Long term (current) use of inhibitors of nucleotide synthesis: Secondary | ICD-10-CM | POA: Diagnosis not present

## 2024-08-03 ENCOUNTER — Telehealth: Payer: Self-pay | Admitting: Physical Therapy

## 2024-08-03 ENCOUNTER — Ambulatory Visit: Attending: Hematology | Admitting: Physical Therapy

## 2024-08-03 DIAGNOSIS — R296 Repeated falls: Secondary | ICD-10-CM | POA: Insufficient documentation

## 2024-08-03 DIAGNOSIS — M6281 Muscle weakness (generalized): Secondary | ICD-10-CM | POA: Insufficient documentation

## 2024-08-03 DIAGNOSIS — C8378 Burkitt lymphoma, lymph nodes of multiple sites: Secondary | ICD-10-CM | POA: Insufficient documentation

## 2024-08-03 DIAGNOSIS — R29898 Other symptoms and signs involving the musculoskeletal system: Secondary | ICD-10-CM | POA: Insufficient documentation

## 2024-08-03 DIAGNOSIS — R262 Difficulty in walking, not elsewhere classified: Secondary | ICD-10-CM | POA: Insufficient documentation

## 2024-08-03 DIAGNOSIS — R252 Cramp and spasm: Secondary | ICD-10-CM | POA: Insufficient documentation

## 2024-08-03 NOTE — Telephone Encounter (Signed)
 Spoke to patient about missed appointment today. Rescheduled for Wed 8/20 8:45am. Patient confirmed next two appointments this week.   Kristeen Sar, PT 08/03/24 2:31 PM

## 2024-08-05 ENCOUNTER — Ambulatory Visit

## 2024-08-05 DIAGNOSIS — C8378 Burkitt lymphoma, lymph nodes of multiple sites: Secondary | ICD-10-CM

## 2024-08-05 DIAGNOSIS — R252 Cramp and spasm: Secondary | ICD-10-CM | POA: Diagnosis present

## 2024-08-05 DIAGNOSIS — R29898 Other symptoms and signs involving the musculoskeletal system: Secondary | ICD-10-CM

## 2024-08-05 DIAGNOSIS — R296 Repeated falls: Secondary | ICD-10-CM | POA: Diagnosis present

## 2024-08-05 DIAGNOSIS — R262 Difficulty in walking, not elsewhere classified: Secondary | ICD-10-CM | POA: Diagnosis present

## 2024-08-05 DIAGNOSIS — M6281 Muscle weakness (generalized): Secondary | ICD-10-CM | POA: Diagnosis present

## 2024-08-05 NOTE — Therapy (Unsigned)
 OUTPATIENT PHYSICAL THERAPY LOWER EXTREMITY TREATMENT  Patient Name: Logan Ward MRN: [id number] DOB:August 03, 1981, 43 y.o., male Today's Date: 08/06/2024  END OF SESSION:  PT End of Session - 08/05/24 1458     Visit Number 31    Date for PT Re-Evaluation 09/01/24    Authorization Type Medicaid: [employer]    Authorization Time Period 06/21/24 through 09/01/24 16 visits    Authorization - Visit Number 4    Authorization - Number of Visits 16    Progress Note Due on Visit 40    PT Start Time 1448    PT Stop Time 1530    PT Time Calculation (min) 42 min    Activity Tolerance Patient tolerated treatment well    Behavior During Therapy WFL for tasks assessed/performed                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)    HIV infection (HCC)    Internal hemorrhoids    Obesity    Rectal ulcer    Past Surgical History:  Procedure Laterality Date   IR IMAGING GUIDED PORT INSERTION  11/18/2022   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   Neutropenia (HCC) 06/26/2023   Rash 06/26/2023   Intractable back pain suspect Granix-related 02/21/2023   Lactic acidosis 02/21/2023   Thrombocytopenia (HCC) 02/01/2023   Hyponatremia 01/31/2023   Hypokalemia 01/23/2023   Hematuria 01/11/2023   Neutropenic fever (HCC) 01/10/2023   Pancytopenia (HCC) 01/10/2023   Acute cystitis 01/10/2023   Anemia 01/01/2023   Healthcare maintenance 12/19/2022   Encounter for chemotherapy management 12/12/2022   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HCC) 12/10/2022   Decreased appetite 12/02/2022   Drug-induced constipation 11/21/2022   Nausea without vomiting 11/21/2022   Acute nonintractable headache 11/21/2022   Encounter for antineoplastic chemotherapy 11/18/2022   Burkitt lymphoma of lymph nodes of multiple regions [facility]) 11/14/2022   Counseling regarding advance care planning and goals  of care 11/14/2022   Chest mass 11/10/2022   Exposure to chlamydia 10/24/2022   Submental lymphadenopathy 09/05/2022   Goals of care, counseling/discussion 07/26/2022   History of syphilis 07/26/2022   Neck swelling 07/26/2022   AIDS (acquired immune deficiency syndrome) (HCC) 07/25/2022    PCP: Logan Emaline Brink, MD  REFERRING PROVIDER: [name], MD  REFERRING DIAG: C83.78 (ICD-10-CM) - Burkitt lymphoma of lymph nodes of multiple regions [facility])  THERAPY DIAG:  Muscle weakness (generalized)  Abnormality of gait and mobility  Rationale for Evaluation and Treatment: Rehabilitation  ONSET DATE: 6-7 months  SUBJECTIVE:  SUBJECTIVE STATEMENT: Patient reports he had 2 falls since last visit.  States he was walking toward the front porch and fell, and the other fall was coming up a step from laundry room.  No injuries.  My knee just gave way.    Eval: Rt leg weakness and gives out sometimes, cramps throughout the night and sometimes while walking. Uses SPC and this helps a lot with prevention of falling when it gives out.  No pattern with walking or standing time with weakness.  4-5 falls. Does have numbness and tingling in both legs throughout bil thighs Did finish cancer treatments last year, does still port. Chemo completed. Wasn't very active during chemo with exhausted and feels so weak now.    PAIN: 07/07/2024 Are you having pain? Yes NPRS scale: 6/10 Pain location: low back  PRECAUTIONS: Other: recent cancer     RED FLAGS: None   WEIGHT BEARING RESTRICTIONS: No  FALLS:  Has patient fallen in last 6 months? Yes. Number of falls 5 (most recent Sunday)  03/23/24: No falls in the past 4 weeks  07/07/24: Logan Ward on 07/03/24 going up steps, states right leg gave out  LIVING  ENVIRONMENT: Lives with: lives with their family Lives in: House/apartment Stairs: No Has following equipment at home: Single point cane, FWW  OCCUPATION: off work right now, CNA  PLOF: Independent  PATIENT GOALS: to feel stronger and get back to how I was before cancer  PERTINENT HISTORY:  HIV/AIDs; syphilis, rapidly growing right cervical nodal mass extending into the level 2 nodal station of the right supraclavicular nodal station and extension into the superficial right sternocleidomastoid muscle.  There is also hypermetabolic right axillary nodal disease.diagnosed in November/December 2023 Sexual abuse: NO  NEXT MD VISIT: 04/05/24 - oncologist  OBJECTIVE:  Note: Objective measures were completed at Evaluation unless otherwise noted.  DIAGNOSTIC FINDINGS:   Xray - hip 01/27/24 Mild degenerative changes to the hip joint  CT NECK WITH CONTRAST 12/28/23  IMPRESSION: 1. Resolution of the large indistinctly marginated mass lesion throughout the anterior lower neck, previously more extensive on the right than the left. Mild scarring of the right sternocleidomastoid muscle but no evidence of recurrent mass lesion. 2. Stable appearance of level 1 lymph nodes, the largest showing short axis dimension of 1 cm. Previously seen mass/adenopathy in the right level 2 region has resolved without residual. Other cervical chain lymph nodes on both sides of the neck are within normal limits in size.   PATIENT SURVEYS:  Initial eval: LEFS Lower Extremity Functional Score: 22 / 80 = 27.5 % indicative of moderate functional limitation   03/23/24: LEFS Lower Extremity Functional Score: 12 / 80 = 15 % indicative of severe functional limitation  (Patient explains he paid more attention to the details of the questions)  04/20/2024 LEFS: 47/80= 58.8%  04/20/2024 LEFS: 56/80= 70.0%  COGNITION: Overall cognitive status: Within functional limits for tasks assessed     SENSATION: Numbness in  both thighs worse with pain, can feel pressure but light touch   POSTURE: rounded shoulders and anterior pelvic tilt  PALPATION: TTP at Rt greater trochanter, tightness noted in Rt hip flexor, Rt lumbar paraspinal  LOWER EXTREMITY ROM:  WFL without pain for full flexion however at end range extension pt reports pain at proximal lateral hip on Rt  LOWER EXTREMITY MMT:  MMT Right Eval */5 Right  03/23/24 Right 05/18/24 Right 07/07/24 Left eval Left 03/23/24 Left  05/18/24 Left 07/07/24  Hip flexion 3 3 3+  4- 4+ 4+ 4+ 4+  Hip extension 3+ 3+ 4 4 4+ 4+ 4+ 4+  Hip abduction 3+ 4+ 4+ 4+ 4 5 5 5   Hip adduction 3+ 4+ 4+ 4+ 4+ 4+ 4+ 5  Hip internal rotation          Hip external rotation          Knee flexion 3+ 4 4+ 4+ 4 4+ 4+ 4+  Knee extension 3 3+ 4- 4+ 4 4+ 4+ 4+  Ankle dorsiflexion 3+ 4- 4 4+ 4+ 4+ 4+ 4+  Ankle plantarflexion          Ankle inversion          Ankle eversion           (Blank rows = not tested)   FUNCTIONAL TESTS:  Initial eval: 5 times sit to stand: 24.06s with use of hands and felt 7/10 on BORG with this  03/23/24: 5 times sit to stand: 17.85 s with use of hands and felt 3/10 on BORG with this  05/18/24: 5 times sit to stand: 12.87 s with use of hands and felt 3/10 on BORG with this 3 MWT: 294.1 ft  07/07/24: 5 times sit to stand: 16.04 s with minimal use of hands no armrests 3 MWT: 400.9 ft   GAIT: Distance walked: 271' (completed last session) Assistive device utilized: Single point cane Level of assistance: Modified independence Comments: greatly decreased cadence, decreased step length and height at Rt leg                                                                                                                                TREATMENT DATE:  08/05/24  Treadmill x 10 min at 1/5 mph Sit to stand with fwd press 10lb kb x 10 Sit to stand with overhead press with 10lb kb x 10 Standing dead lifts with 10lb kb 2 x 10 Seated LAQ with 7lbs Seated  clamshell x 20 with red loop Standing lateral band walks with red loop x 3 laps of approx 15 feet (seated rest breaks after each lap) Step up on 8 step without UE support 2 x 10 each LE   07/07/24  Treadmill x 5 min at 1/5 mph Re-assessment completed: see above Walking on treadmill and walking for 3 MWT : emphasized heel to toe progression and proper gait sequence.  Sit to stand  x 5 to assess need for push off Education on exercise plan outside of therapy visits and how critical it is to be diligent with this to successfully meet goals.   07/06/24  Nustep x 10 min level 5 Leg Pres (seat 9) 90# 2 x 10;  unilateral 50# x 10 bilateral  Sit to stand + chest press 15# x 10 in front of mirror for visual feedback Modified Deadlift 15# 2 x 10 in front of mirror for visual feedback Unilateral 15# farmer's carry x 2 laps from ortho  gym to end of hall (switching hands at end of hall) Seated LAQ x 20 each LE with 6 lbs Seated biceps curls 7# 2 x 10 Seated alternating chest press 7# 2 x 10 bilateral  Sit to stand 2 x 10 in tandem with left leg back then 2 x 10 with right leg back  PATIENT EDUCATION:  Education details: VCQJIVSS Person educated: Patient Education method: Explanation, Demonstration, Tactile cues, Verbal cues, and Handouts Education comprehension: verbalized understanding, returned demonstration, verbal cues required, tactile cues required, and needs further education  HOME EXERCISE PROGRAM: Access Code: [email or web address] URL: [email or web address] Date: 02/03/2024 Prepared by: Mliss  Exercises - Supine March  - 1 x daily - 7 x weekly - 2 sets - 10 reps - Bent Knee Fallouts  - 1 x daily - 7 x weekly - 2 sets - 10 reps - Supine Hip Adductor Squeeze with Small Ball  - 1 x daily - 7 x weekly - 2 sets - 10 reps - Sit to Stand  - 3 x daily - 7 x weekly - 1-2 sets - 5-10 reps - Hooklying Clamshell with Resistance  - 1 x daily - 3 x weekly - 2 sets - 10  reps  ASSESSMENT:  CLINICAL IMPRESSION: Arley is making good progress with overall strength and function but continues to report falls.  We began more isolated quad and functional strengthening to reduce risk of instability moments of the right knee which contributes to his reported falls.  He needed several rest breaks but completed all tasks.  He would benefit from a walking program on his own but he has not established this yet.  He should benefit from continued skilled PT to meet goals below.        Eval: Patient is a 43 y.o. male  who was seen today for physical therapy evaluation and treatment for Rt leg weakness. Pt has complex history of Burkitt lymphoma with chemo treatments now completed. Pt reports he was exhausted during his treatments and feels like he is very weak and fatigued now. Does have numbness in bil thighs only posterior and anteriorly and feels like his Rt leg gives out to the point of him having 5 falls in the last several months. Pt does demonstrate gait deficits, very slow cadence and heavy trunk lean onto [facility] which pt uses on RT hand reporting he has more able to catch himself with the cane him this hand if his knee gives. Pt found to have decreased strength bil LE but worse in Rt leg. Pt demonstrated high fall risk based on 5xSTS and after completing this was very fatigued and needed several minutes to recover stating 7/10 on BORG scale. LEFS score 22 / 80. 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   OBJECTIVE IMPAIRMENTS: Abnormal gait, decreased activity tolerance, decreased balance, decreased coordination, decreased endurance, decreased knowledge of use of DME, decreased mobility, difficulty walking, decreased strength, increased fascial restrictions, impaired perceived functional ability, increased muscle spasms, impaired flexibility, impaired sensation, improper body mechanics,  postural dysfunction, and pain.   ACTIVITY LIMITATIONS: carrying, lifting, bending, sitting, standing, squatting, stairs, transfers, bed mobility, and locomotion level  PARTICIPATION LIMITATIONS: cleaning, laundry, interpersonal relationship, driving, shopping, community activity, occupation, and yard work  PERSONAL FACTORS: Fitness, Time since onset of injury/illness/exacerbation, and 1 comorbidity: medical history  are also affecting patient's functional outcome.   REHAB POTENTIAL: Good  CLINICAL  DECISION MAKING: Evolving/moderate complexity  EVALUATION COMPLEXITY: Moderate   GOALS: Goals reviewed with patient? Yes  SHORT TERM GOALS: Target date: 02/25/24 Pt to be I with HEP.  Baseline: Goal status: In progress (compliance is poor)  2.  Pt to report improved LEFS score of 30 to improve toward lowering functional impairment limitations.  Baseline:  Goal status: MET 04/20/2024  3.  Pt to demonstrate ability to complete body weight squats without hands for 10 reps to improve LE strength for tolerance for walking.  Baseline:  Goal status: MET 03/23/24  4.  Pt to demonstrate improved gait mechanics with even step length and height to be able to decreased fall risk with community ambulation.   Baseline:  Goal status: MET 03/23/24   LONG TERM GOALS: Target date: 05/18/24  Pt to be I with advanced HEP.  Baseline:  Goal status: MET 05/18/24  2.  Pt to demonstrate 10# squats without compensatory strategies for improved hip strength for tolerance to return to work Baseline:  Goal status: MET 08/05/24  3.  Pt to report ability to walk with or without AD for at least 30 mins with no increase in pain for tolerance to return to work.  Baseline:  Goal status: MET 08/05/24  4.  Pt to demonstrate 5xSTS in 7s to achieve age related norm for decreased fall risk Baseline:  Goal status: In Progress  5.  Pt to demonstrate at least 4+/5 bil hip strength for improved pelvic stability and functional  squats without leakage.  Baseline:  Goal status: In Progress   PLAN:  PT FREQUENCY: 2x/week  PT DURATION: 8 weeks  PLANNED INTERVENTIONS: 97110-Therapeutic exercises, 97530- Therapeutic activity, [id number]- Neuromuscular re-education, 97535- Self Care, [id number]- Manual therapy, [phone]- Gait training, [phone]- Aquatic Therapy, [phone]- Electrical stimulation (manual), [id number]- Vasopneumatic device, [id number]- Ultrasound, [id number]- Ionotophoresis 4mg /ml Dexamethasone , Patient/Family education, Balance training, Stair training, Taping, Dry Needling, Joint mobilization, Spinal mobilization, Scar mobilization, DME instructions, Cryotherapy, Moist heat, and Biofeedback  PLAN FOR NEXT SESSION:  Continue LE strengthening ;standing balance, core strengthening, gait training, functional and return to work simulated tasks  Boswell B. Shelia Magallon, PT 08/06/24 7:29 AM [facility] [address], [address] [place], [place] [address] Phone # [phone] Fax [phone]

## 2024-08-08 ENCOUNTER — Encounter: Payer: Self-pay | Admitting: Hematology

## 2024-08-08 NOTE — Progress Notes (Signed)
 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  The patient presented with a rapidly growing right cervical nodal mass extending into the level 2 nodal station of the right supraclavicular nodal station and extension into the superficial right sternocleidomastoid muscle.  There is also hypermetabolic right axillary nodal disease.  No evidence of spleen or solid organ involvement.  No evidence of marrow involvement.  The patient was diagnosed in November/December 2023  HISTORY OF PRESENTING ILLNESS:  See previous notes for details on initial presentation  PRIOR THERAPY: EPOCH-R x 6 cycles with IT methotrexate  for 2 doses (patient declined subsequent IT Methotrexate  doses)   INTERVAL HISTORY:  Logan Ward 43 y.o. male with history of HIV AIDS and Burkitt's lymphoma who is here for continued follow-up of his lymphoma. He notes he continues to see ID at the [facility]  for continued evaluation and management of his HIVl.  He was last seen by Alan Geralds CPP.  On 07/08/2024. She also recently had a rash which was noted to be from syphilis and was treated with doxycycline  for this. Patient has had multiple no-show appointments with us  and was last seen in January 2025. He has seen orthopedics for some knee issues that make him feel like his knee is giving way and was recommended physical therapy.  He has not really started physical therapy yet but notes that he is ready to do that now.  He has not really started working yet.  Notes good p.o. intake. No fevers no chills no night sweats.  No new lumps or bumps. No new abdominal pain or distention.  No change in bowel habits.   MEDICAL HISTORY: 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)    HIV infection (HCC)     Internal hemorrhoids    Obesity    Rectal ulcer     ALLERGIES:  has no known allergies.  MEDICATIONS:  Current Outpatient Medications  Medication Sig Dispense Refill   B Complex-C (B-COMPLEX WITH VITAMIN C ) tablet Take 1 tablet by mouth daily. 30 tablet 11   bictegravir-emtricitabine -tenofovir  AF (BIKTARVY ) 50-200-25 MG TABS tablet Take 1 tablet by mouth daily. Patient needs appt 30 tablet 5   HYDROcodone -acetaminophen  (NORCO/VICODIN) 5-325 MG tablet Take 1 tablet by mouth every 6 (six) hours as needed. 12 tablet 0   ibuprofen  (ADVIL ) 600 MG tablet Take 1 tablet (600 mg total) by mouth every 6 (six) hours as needed. 30 tablet 0   ketoconazole  (NIZORAL ) 2 % cream Apply topically 2 times daily for 2 weeks (Patient not taking: Reported on 09/26/2023) 60 g 1   No current facility-administered medications for this visit.    SURGICAL HISTORY:  Past Surgical History:  Procedure Laterality Date   IR IMAGING GUIDED PORT INSERTION  11/18/2022    REVIEW OF SYSTEMS:   .10 Point review of Systems was done is negative except as noted above.  PHYSICAL EXAMINATION: PHONE VISIT .BP 121/75   Pulse (!) 59   Temp (!) 97.5 F (36.4 C)   Resp 20   Wt 297 lb (134.7 kg)   SpO2 98%   BMI 39.18 kg/m  . GENERAL:alert, in no acute distress and comfortable SKIN: no acute rashes, no significant lesions EYES: conjunctiva are pink and non-injected, sclera anicteric OROPHARYNX: MMM, no exudates, no  oropharyngeal erythema or ulceration NECK: supple, no JVD LYMPH:  no palpable lymphadenopathy in the cervical, axillary or inguinal regions LUNGS: clear to auscultation b/l with normal respiratory effort HEART: regular rate & rhythm ABDOMEN:  normoactive bowel sounds , non tender, not distended. Extremity: no pedal edema PSYCH: alert & oriented x 3 with fluent speech NEURO: no focal motor/sensory deficits   ECOG PERFORMANCE STATUS: 1   LABORATORY DATA:  .SABRA    Latest Ref Rng & Units 07/12/2024    10:50 AM 01/05/2024   12:58 PM 11/03/2023   12:38 PM  CBC  WBC 4.0 - 10.5 K/uL 4.2  4.4  3.2   Hemoglobin 13.0 - 17.0 g/dL 87.5  87.2  87.6   Hematocrit 39.0 - 52.0 % 38.7  38.8  38.1   Platelets 150 - 400 K/uL 230  145  168   ANC 400 .    Latest Ref Rng & Units 07/12/2024   10:50 AM 01/05/2024   12:58 PM 11/03/2023   12:38 PM  CMP  Glucose 70 - 99 mg/dL 93  83  97   BUN 6 - 20 mg/dL 13  11  9    Creatinine 0.61 - 1.24 mg/dL 8.98  9.00  8.95   Sodium 135 - 145 mmol/L 139  137  138   Potassium 3.5 - 5.1 mmol/L 4.4  4.0  4.0   Chloride 98 - 111 mmol/L 108  106  104   CO2 22 - 32 mmol/L 27  27  28    Calcium 8.9 - 10.3 mg/dL 9.2  9.1  9.5   Total Protein 6.5 - 8.1 g/dL 8.1  7.3  7.6   Total Bilirubin 0.0 - 1.2 mg/dL 0.7  1.3  1.2   Alkaline Phos 38 - 126 U/L 64  80  85   AST 15 - 41 U/L 17  14  19    ALT 0 - 44 U/L 18  15  18     Component     Latest Ref Rng 07/08/2024  HIV 1 RNA Quant     NOT DETECTED copies/mL 74 (H)   HIV-1 RNA Quant, Log     NOT DETECTED Log copies/mL 1.87 (H)   CD4 T Cell Abs     400 - 1,790 /uL 297 (L)   CD4 % Helper T Cell     33 - 65 % 14 (L)     Legend: (H) High (L) Low RADIOGRAPHIC STUDIES:  No results found.    ASSESSMENT/PLAN:   43year-old male with HIV/AIDS with hx of Burkitt's lymphoma   #1  Stage II HIV Associated Burkitt's lymphoma Presenting with rapidly growing right neck mass extending into the chest, bilateral x-ray lymphadenopathy and also concern for possible involvement of retroperitoneal lymph nodes. Concerning for at least stage II possibly stage III disease. Some borderline lymphadenopathy in the abdomen could also be from his recently diagnosed HIV/AIDS. No clinical symptoms suggestive of CNS involvement at this time. Brain MRI negative.  No constitutional symptoms. No significant cytopenias to suggest bone marrow involvement. Discussed with patient and he is not interested in fertility preservation or sperm banking. First  presented in August at [facility] and was treated empirically with steroids and antibiotics with improvement in swelling prior to additional progression. Patient is status post EPOCH-R x 6 cycles and 2 doses of intrathecal methotrexate  for CNS prophylaxis.  She declined the remaining 2 doses of intrathecal methotrexate .   #2 HIV/AIDS -Following Dr. Calone from ID and currently  taking Biktarvy . Last labs 17/20/2025 showed a CD4 count of 297 with viral load of 1.87 log copies/ml   #3 history of remote syphilis 3 to 4 years ago .  Patient reports this was completely treated.  Patient had relapse of secondary syphilis recently which was treated by infectious disease with doxycycline .    PLAN: - Patient was here for labs on 07/12/2024 but did not stay for his MD visit and therefore was rescheduled. - We discussed the importance of compliance with follow-up since he has had a lot of no-shows. - Labs from 07/12/2024 were discussed in details CBC shows normal hemoglobin of 12.4 with normal WBC count of 4.2k and platelets of 230k CMP within normal limits Last CD4 count was 297 with a viral load that still detectable at 1.87 log copies per mL - He continues to be on Biktarvy  and notes that he has been compliant with his ART medications -We discussed that he need to follow-up closely with his infectious disease doctor to ensure good viral suppression of his HIV since this will have a bearing on his relapse risk with his Burkitt's lymphoma. - Patient has no clinical symptoms or signs suggestive of Burkitt's lymphoma recurrence/progression at this time.  He is now about nearly a year and a half out from his last treatment in the first week of March 2024. - Recent treatment for syphilis with resolution of his rash. - I recommended that he take appropriate precautions to avoid transmission of HIV since he is still viral load positive. - OI prophylaxis per ID team. - Continue follow-up with orthopedics  regarding his knee instability.  Was recommended to reconnect with physical therapy to work on this.   FOLLOW-UP: Return to clinic with Dr. Onesimo with labs in 3 months  .The total time spent in the appointment was 30 minutes* .  All of the patient's questions were answered with apparent satisfaction. The patient knows to call the clinic with any problems, questions or concerns.   Emaline Onesimo MD MS AAHIVMS [facility] Hematology/Oncology Physician [facility]  .*Total Encounter Time as defined by the Centers for Medicare and Medicaid Services includes, in addition to the face-to-face time of a patient visit (documented in the note above) non-face-to-face time: obtaining and reviewing outside history, ordering and reviewing medications, tests or procedures, care coordination (communications with other health care professionals or caregivers) and documentation in the medical record.

## 2024-08-10 ENCOUNTER — Encounter: Payer: Self-pay | Admitting: Physical Therapy

## 2024-08-10 ENCOUNTER — Ambulatory Visit: Admitting: Physical Therapy

## 2024-08-10 DIAGNOSIS — R296 Repeated falls: Secondary | ICD-10-CM

## 2024-08-10 DIAGNOSIS — M6281 Muscle weakness (generalized): Secondary | ICD-10-CM

## 2024-08-10 DIAGNOSIS — R252 Cramp and spasm: Secondary | ICD-10-CM

## 2024-08-10 DIAGNOSIS — R262 Difficulty in walking, not elsewhere classified: Secondary | ICD-10-CM

## 2024-08-10 NOTE — Therapy (Signed)
 OUTPATIENT PHYSICAL THERAPY LOWER EXTREMITY TREATMENT  Patient Name: Logan Ward MRN: [id number] DOB:[birth year], 43 y.o., male Today's Date: 08/10/2024  END OF SESSION:  PT End of Session - 08/10/24 1152     Visit Number 32    Date for PT Re-Evaluation 09/01/24    Authorization Type Medicaid: [employer]    Authorization Time Period 06/21/24 through 09/01/24 16 visits    Authorization - Visit Number 5    Authorization - Number of Visits 16    Progress Note Due on Visit 40    PT Start Time 1104    PT Stop Time 1145    PT Time Calculation (min) 41 min    Activity Tolerance Patient tolerated treatment well    Behavior During Therapy WFL for tasks assessed/performed                 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)    HIV infection (HCC)    Internal hemorrhoids    Obesity    Rectal ulcer    Past Surgical History:  Procedure Laterality Date   IR IMAGING GUIDED PORT INSERTION  11/18/2022   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   Neutropenia (HCC) 06/26/2023   Rash 06/26/2023   Intractable back pain suspect Granix-related 02/21/2023   Lactic acidosis 02/21/2023   Thrombocytopenia (HCC) 02/01/2023   Hyponatremia 01/31/2023   Hypokalemia 01/23/2023   Hematuria 01/11/2023   Neutropenic fever (HCC) 01/10/2023   Pancytopenia (HCC) 01/10/2023   Acute cystitis 01/10/2023   Anemia 01/01/2023   Healthcare maintenance 12/19/2022   Encounter for chemotherapy management 12/12/2022   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HCC) 12/10/2022   Decreased appetite 12/02/2022   Drug-induced constipation 11/21/2022   Nausea without vomiting 11/21/2022   Acute nonintractable headache 11/21/2022   Encounter for antineoplastic chemotherapy 11/18/2022   Burkitt lymphoma of lymph nodes of multiple regions [facility]) 11/14/2022   Counseling regarding advance care planning and goals  of care 11/14/2022   Chest mass 11/10/2022   Exposure to chlamydia 10/24/2022   Submental lymphadenopathy 09/05/2022   Goals of care, counseling/discussion 07/26/2022   History of syphilis 07/26/2022   Neck swelling 07/26/2022   AIDS (acquired immune deficiency syndrome) (HCC) 07/25/2022    PCP: [name], MD  REFERRING PROVIDER: [name], MD  REFERRING DIAG: C83.78 (ICD-10-CM) - Burkitt lymphoma of lymph nodes of multiple regions [facility])  THERAPY DIAG:  Muscle weakness (generalized)  Abnormality of gait and mobility  Rationale for Evaluation and Treatment: Rehabilitation  ONSET DATE: 6-7 months  SUBJECTIVE:  SUBJECTIVE STATEMENT: Patient reports he is doing okay today. No falls since last visit.   Eval: Rt leg weakness and gives out sometimes, cramps throughout the night and sometimes while walking. Uses SPC and this helps a lot with prevention of falling when it gives out.  No pattern with walking or standing time with weakness.  4-5 falls. Does have numbness and tingling in both legs throughout bil thighs Did finish cancer treatments last year, does still port. Chemo completed. Wasn't very active during chemo with exhausted and feels so weak now.    PAIN: 07/07/2024 Are you having pain? Yes NPRS scale: 6/10 Pain location: low back  PRECAUTIONS: Other: recent cancer     RED FLAGS: None   WEIGHT BEARING RESTRICTIONS: No  FALLS:  Has patient fallen in last 6 months? Yes. Number of falls 5 (most recent Sunday)  03/23/24: No falls in the past 4 weeks  07/07/24: [name] on 07/03/24 going up steps, states right leg gave out  LIVING ENVIRONMENT: Lives with: lives with their family Lives in: House/apartment Stairs: No Has following equipment at home: Single point  cane, FWW  OCCUPATION: off work right now, CNA  PLOF: Independent  PATIENT GOALS: to feel stronger and get back to how I was before cancer  PERTINENT HISTORY:  HIV/AIDs; syphilis, rapidly growing right cervical nodal mass extending into the level 2 nodal station of the right supraclavicular nodal station and extension into the superficial right sternocleidomastoid muscle.  There is also hypermetabolic right axillary nodal disease.diagnosed in November/December 2023 Sexual abuse: NO  NEXT MD VISIT: 04/05/24 - oncologist  OBJECTIVE:  Note: Objective measures were completed at Evaluation unless otherwise noted.  DIAGNOSTIC FINDINGS:   Xray - hip 01/27/24 Mild degenerative changes to the hip joint  CT NECK WITH CONTRAST 12/28/23  IMPRESSION: 1. Resolution of the large indistinctly marginated mass lesion throughout the anterior lower neck, previously more extensive on the right than the left. Mild scarring of the right sternocleidomastoid muscle but no evidence of recurrent mass lesion. 2. Stable appearance of level 1 lymph nodes, the largest showing short axis dimension of 1 cm. Previously seen mass/adenopathy in the right level 2 region has resolved without residual. Other cervical chain lymph nodes on both sides of the neck are within normal limits in size.   PATIENT SURVEYS:  Initial eval: LEFS Lower Extremity Functional Score: 22 / 80 = 27.5 % indicative of moderate functional limitation   03/23/24: LEFS Lower Extremity Functional Score: 12 / 80 = 15 % indicative of severe functional limitation  (Patient explains he paid more attention to the details of the questions)  04/20/2024 LEFS: 47/80= 58.8%  04/20/2024 LEFS: 56/80= 70.0%  COGNITION: Overall cognitive status: Within functional limits for tasks assessed     SENSATION: Numbness in both thighs worse with pain, can feel pressure but light touch   POSTURE: rounded shoulders and anterior pelvic tilt  PALPATION: TTP  at Rt greater trochanter, tightness noted in Rt hip flexor, Rt lumbar paraspinal  LOWER EXTREMITY ROM:  WFL without pain for full flexion however at end range extension pt reports pain at proximal lateral hip on Rt  LOWER EXTREMITY MMT:  MMT Right Eval */5 Right  03/23/24 Right 05/18/24 Right 07/07/24 Left eval Left 03/23/24 Left  05/18/24 Left 07/07/24  Hip flexion 3 3 3+ 4- 4+ 4+ 4+ 4+  Hip extension 3+ 3+ 4 4 4+ 4+ 4+ 4+  Hip abduction 3+ 4+ 4+ 4+ 4 5 5 5   Hip adduction  3+ 4+ 4+ 4+ 4+ 4+ 4+ 5  Hip internal rotation          Hip external rotation          Knee flexion 3+ 4 4+ 4+ 4 4+ 4+ 4+  Knee extension 3 3+ 4- 4+ 4 4+ 4+ 4+  Ankle dorsiflexion 3+ 4- 4 4+ 4+ 4+ 4+ 4+  Ankle plantarflexion          Ankle inversion          Ankle eversion           (Blank rows = not tested)   FUNCTIONAL TESTS:  Initial eval: 5 times sit to stand: 24.06s with use of hands and felt 7/10 on BORG with this  03/23/24: 5 times sit to stand: 17.85 s with use of hands and felt 3/10 on BORG with this  05/18/24: 5 times sit to stand: 12.87 s with use of hands and felt 3/10 on BORG with this 3 MWT: 294.1 ft  07/07/24: 5 times sit to stand: 16.04 s with minimal use of hands no armrests 3 MWT: 400.9 ft   GAIT: Distance walked: 271' (completed last session) Assistive device utilized: Single point cane Level of assistance: Modified independence Comments: greatly decreased cadence, decreased step length and height at Rt leg                                                                                                                                TREATMENT DATE:  08/10/24  Treadmill x 10 min at 1/5 mph Sit to stand with fwd press 15lb kb x 10 Sit to stand with overhead press with 10lb kb x 10 Standing dead lifts with 15lb kb 2 x 10 Standing row at dual cable row 10# 2 x 10 Pallof press at cable column 10# x 12 each direction Standing lateral band walks with red loop x 3 laps of approx 15 feet  (seated rest breaks after each lap) 15# unilateral farmer's carry x 1 lap around both gyms     08/05/24  Treadmill x 10 min at 1/5 mph Sit to stand with fwd press 10lb kb x 10 Sit to stand with overhead press with 10lb kb x 10 Standing dead lifts with 10lb kb 2 x 10 Seated LAQ with 7lbs Seated clamshell x 20 with red loop Standing lateral band walks with red loop x 3 laps of approx 15 feet (seated rest breaks after each lap) Step up on 8 step without UE support 2 x 10 each LE   07/07/24  Treadmill x 5 min at 1/5 mph Re-assessment completed: see above Walking on treadmill and walking for 3 MWT : emphasized heel to toe progression and proper gait sequence.  Sit to stand  x 5 to assess need for push off Education on exercise plan outside of therapy visits and how critical it is to be diligent with this to successfully meet goals.  07/06/24  Nustep x 10 min level 5 Leg Pres (seat 9) 90# 2 x 10;  unilateral 50# x 10 bilateral  Sit to stand + chest press 15# x 10 in front of mirror for visual feedback Modified Deadlift 15# 2 x 10 in front of mirror for visual feedback Unilateral 15# farmer's carry x 2 laps from ortho gym to end of hall (switching hands at end of hall) Seated LAQ x 20 each LE with 6 lbs Seated biceps curls 7# 2 x 10 Seated alternating chest press 7# 2 x 10 bilateral  Sit to stand 2 x 10 in tandem with left leg back then 2 x 10 with right leg back  PATIENT EDUCATION:  Education details: VCQJIVSS Person educated: Patient Education method: Explanation, Demonstration, Tactile cues, Verbal cues, and Handouts Education comprehension: verbalized understanding, returned demonstration, verbal cues required, tactile cues required, and needs further education  HOME EXERCISE PROGRAM: Access Code: [email or web address] URL: [email or web address] Date: 02/03/2024 Prepared by: Mliss  Exercises - Supine March  - 1 x daily - 7 x weekly - 2 sets - 10 reps - Bent Knee Fallouts   - 1 x daily - 7 x weekly - 2 sets - 10 reps - Supine Hip Adductor Squeeze with Small Ball  - 1 x daily - 7 x weekly - 2 sets - 10 reps - Sit to Stand  - 3 x daily - 7 x weekly - 1-2 sets - 5-10 reps - Hooklying Clamshell with Resistance  - 1 x daily - 3 x weekly - 2 sets - 10 reps  ASSESSMENT:  CLINICAL IMPRESSION: Logan Ward has not had any falls since last visit. Discussed plan to return to work and he feels he is mentally ready but he is not sure if his body is ready.  He tolerated progressions in weight with strengthening exercises well. Incorporated standing core and postural strengthening exercises at the cable column. PT provided verbal and visual cues for correct exercise performance. Patient will benefit from skilled PT to address the below impairments and improve overall function.    Eval: Patient is a 43 y.o. male  who was seen today for physical therapy evaluation and treatment for Rt leg weakness. Pt has complex history of Burkitt lymphoma with chemo treatments now completed. Pt reports he was exhausted during his treatments and feels like he is very weak and fatigued now. Does have numbness in bil thighs only posterior and anteriorly and feels like his Rt leg gives out to the point of him having 5 falls in the last several months. Pt does demonstrate gait deficits, very slow cadence and heavy trunk lean onto [facility] which pt uses on RT hand reporting he has more able to catch himself with the cane him this hand if his knee gives. Pt found to have decreased strength bil LE but worse in Rt leg. Pt demonstrated high fall risk based on 5xSTS and after completing this was very fatigued and needed several minutes to recover stating 7/10 on BORG scale. LEFS score 22 / 80. 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   OBJECTIVE IMPAIRMENTS: Abnormal gait, decreased activity tolerance, decreased balance, decreased  coordination, decreased endurance, decreased knowledge of use of DME, decreased mobility, difficulty walking, decreased strength, increased fascial restrictions, impaired perceived functional ability, increased muscle spasms, impaired flexibility, impaired sensation, improper body mechanics, postural dysfunction, and pain.   ACTIVITY LIMITATIONS: carrying,  lifting, bending, sitting, standing, squatting, stairs, transfers, bed mobility, and locomotion level  PARTICIPATION LIMITATIONS: cleaning, laundry, interpersonal relationship, driving, shopping, community activity, occupation, and yard work  PERSONAL FACTORS: Fitness, Time since onset of injury/illness/exacerbation, and 1 comorbidity: medical history  are also affecting patient's functional outcome.   REHAB POTENTIAL: Good  CLINICAL DECISION MAKING: Evolving/moderate complexity  EVALUATION COMPLEXITY: Moderate   GOALS: Goals reviewed with patient? Yes  SHORT TERM GOALS: Target date: 02/25/24 Pt to be I with HEP.  Baseline: Goal status: In progress (compliance is poor)  2.  Pt to report improved LEFS score of 30 to improve toward lowering functional impairment limitations.  Baseline:  Goal status: MET 04/20/2024  3.  Pt to demonstrate ability to complete body weight squats without hands for 10 reps to improve LE strength for tolerance for walking.  Baseline:  Goal status: MET 03/23/24  4.  Pt to demonstrate improved gait mechanics with even step length and height to be able to decreased fall risk with community ambulation.   Baseline:  Goal status: MET 03/23/24   LONG TERM GOALS: Target date: 05/18/24  Pt to be I with advanced HEP.  Baseline:  Goal status: MET 05/18/24  2.  Pt to demonstrate 10# squats without compensatory strategies for improved hip strength for tolerance to return to work Baseline:  Goal status: MET 08/05/24  3.  Pt to report ability to walk with or without AD for at least 30 mins with no increase in pain for  tolerance to return to work.  Baseline:  Goal status: MET 08/05/24  4.  Pt to demonstrate 5xSTS in 7s to achieve age related norm for decreased fall risk Baseline:  Goal status: In Progress  5.  Pt to demonstrate at least 4+/5 bil hip strength for improved pelvic stability and functional squats without leakage.  Baseline:  Goal status: In Progress   PLAN:  PT FREQUENCY: 2x/week  PT DURATION: 8 weeks  PLANNED INTERVENTIONS: 97110-Therapeutic exercises, 97530- Therapeutic activity, 97112- Neuromuscular re-education, [phone]- Self Care, 02859- Manual therapy, [phone]- Gait training, [phone]- Aquatic Therapy, [phone]- Electrical stimulation (manual), [id number]- Vasopneumatic device, [id number]- Ultrasound, [id number]- Ionotophoresis 4mg /ml Dexamethasone , Patient/Family education, Balance training, Stair training, Taping, Dry Needling, Joint mobilization, Spinal mobilization, Scar mobilization, DME instructions, Cryotherapy, Moist heat, and Biofeedback  PLAN FOR NEXT SESSION:  Continue LE strengthening ;standing balance, core strengthening, gait training, functional and return to work simulated tasks  Kristeen Sar, PT 08/10/24 11:53 AM [facility] [address] [place], [place] [address] Phone # [phone] Fax [phone]

## 2024-08-12 ENCOUNTER — Ambulatory Visit: Admitting: Physical Therapy

## 2024-08-12 ENCOUNTER — Encounter: Payer: Self-pay | Admitting: Physical Therapy

## 2024-08-12 DIAGNOSIS — R252 Cramp and spasm: Secondary | ICD-10-CM

## 2024-08-12 DIAGNOSIS — R262 Difficulty in walking, not elsewhere classified: Secondary | ICD-10-CM

## 2024-08-12 DIAGNOSIS — M6281 Muscle weakness (generalized): Secondary | ICD-10-CM

## 2024-08-12 DIAGNOSIS — R296 Repeated falls: Secondary | ICD-10-CM

## 2024-08-12 NOTE — Therapy (Signed)
 OUTPATIENT PHYSICAL THERAPY LOWER EXTREMITY TREATMENT  Patient Name: Logan Ward MRN: [id number] DOB:03-03-1981, 43 y.o., male Today's Date: 08/12/2024  END OF SESSION:  PT End of Session - 08/12/24 1157     Visit Number 33    Date for PT Re-Evaluation 09/01/24    Authorization Type Medicaid: [employer]    Authorization Time Period 06/21/24 through 09/01/24 16 visits    Authorization - Visit Number 6    Authorization - Number of Visits 16    Progress Note Due on Visit 40    PT Start Time 1128    PT Stop Time 1146    PT Time Calculation (min) 18 min    Activity Tolerance Patient tolerated treatment well    Behavior During Therapy WFL for tasks assessed/performed                  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)    HIV infection (HCC)    Internal hemorrhoids    Obesity    Rectal ulcer    Past Surgical History:  Procedure Laterality Date   IR IMAGING GUIDED PORT INSERTION  11/18/2022   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   Neutropenia (HCC) 06/26/2023   Rash 06/26/2023   Intractable back pain suspect Granix-related 02/21/2023   Lactic acidosis 02/21/2023   Thrombocytopenia (HCC) 02/01/2023   Hyponatremia 01/31/2023   Hypokalemia 01/23/2023   Hematuria 01/11/2023   Neutropenic fever (HCC) 01/10/2023   Pancytopenia (HCC) 01/10/2023   Acute cystitis 01/10/2023   Anemia 01/01/2023   Healthcare maintenance 12/19/2022   Encounter for chemotherapy management 12/12/2022   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HCC) 12/10/2022   Decreased appetite 12/02/2022   Drug-induced constipation 11/21/2022   Nausea without vomiting 11/21/2022   Acute nonintractable headache 11/21/2022   Encounter for antineoplastic chemotherapy 11/18/2022   Burkitt lymphoma of lymph nodes of multiple regions [facility]) 11/14/2022   Counseling regarding advance care planning and  goals of care 11/14/2022   Chest mass 11/10/2022   Exposure to chlamydia 10/24/2022   Submental lymphadenopathy 09/05/2022   Goals of care, counseling/discussion 07/26/2022   History of syphilis 07/26/2022   Neck swelling 07/26/2022   AIDS (acquired immune deficiency syndrome) (HCC) 07/25/2022    PCP: [name], MD  REFERRING PROVIDER: [name], MD  REFERRING DIAG: C83.78 (ICD-10-CM) - Burkitt lymphoma of lymph nodes of multiple regions [facility])  THERAPY DIAG:  Muscle weakness (generalized)  Abnormality of gait and mobility  Rationale for Evaluation and Treatment: Rehabilitation  ONSET DATE: 6-7 months  SUBJECTIVE:  SUBJECTIVE STATEMENT: Patient reports he is was tired after last treatment session.  Eval: Rt leg weakness and gives out sometimes, cramps throughout the night and sometimes while walking. Uses SPC and this helps a lot with prevention of falling when it gives out.  No pattern with walking or standing time with weakness.  4-5 falls. Does have numbness and tingling in both legs throughout bil thighs Did finish cancer treatments last year, does still port. Chemo completed. Wasn't very active during chemo with exhausted and feels so weak now.    PAIN: 07/07/2024 Are you having pain? Yes NPRS scale: 6/10 Pain location: low back  PRECAUTIONS: Other: recent cancer     RED FLAGS: None   WEIGHT BEARING RESTRICTIONS: No  FALLS:  Has patient fallen in last 6 months? Yes. Number of falls 5 (most recent Sunday)  03/23/24: No falls in the past 4 weeks  07/07/24: [name] on 07/03/24 going up steps, states right leg gave out  LIVING ENVIRONMENT: Lives with: lives with their family Lives in: House/apartment Stairs: No Has following equipment at home: Single point  cane, FWW  OCCUPATION: off work right now, CNA  PLOF: Independent  PATIENT GOALS: to feel stronger and get back to how I was before cancer  PERTINENT HISTORY:  HIV/AIDs; syphilis, rapidly growing right cervical nodal mass extending into the level 2 nodal station of the right supraclavicular nodal station and extension into the superficial right sternocleidomastoid muscle.  There is also hypermetabolic right axillary nodal disease.diagnosed in November/December 2023 Sexual abuse: NO  NEXT MD VISIT: 04/05/24 - oncologist  OBJECTIVE:  Note: Objective measures were completed at Evaluation unless otherwise noted.  DIAGNOSTIC FINDINGS:   Xray - hip 01/27/24 Mild degenerative changes to the hip joint  CT NECK WITH CONTRAST 12/28/23  IMPRESSION: 1. Resolution of the large indistinctly marginated mass lesion throughout the anterior lower neck, previously more extensive on the right than the left. Mild scarring of the right sternocleidomastoid muscle but no evidence of recurrent mass lesion. 2. Stable appearance of level 1 lymph nodes, the largest showing short axis dimension of 1 cm. Previously seen mass/adenopathy in the right level 2 region has resolved without residual. Other cervical chain lymph nodes on both sides of the neck are within normal limits in size.   PATIENT SURVEYS:  Initial eval: LEFS Lower Extremity Functional Score: 22 / 80 = 27.5 % indicative of moderate functional limitation   03/23/24: LEFS Lower Extremity Functional Score: 12 / 80 = 15 % indicative of severe functional limitation  (Patient explains he paid more attention to the details of the questions)  04/20/2024 LEFS: 47/80= 58.8%  04/20/2024 LEFS: 56/80= 70.0%  COGNITION: Overall cognitive status: Within functional limits for tasks assessed     SENSATION: Numbness in both thighs worse with pain, can feel pressure but light touch   POSTURE: rounded shoulders and anterior pelvic tilt  PALPATION: TTP  at Rt greater trochanter, tightness noted in Rt hip flexor, Rt lumbar paraspinal  LOWER EXTREMITY ROM:  WFL without pain for full flexion however at end range extension pt reports pain at proximal lateral hip on Rt  LOWER EXTREMITY MMT:  MMT Right Eval */5 Right  03/23/24 Right 05/18/24 Right 07/07/24 Left eval Left 03/23/24 Left  05/18/24 Left 07/07/24  Hip flexion 3 3 3+ 4- 4+ 4+ 4+ 4+  Hip extension 3+ 3+ 4 4 4+ 4+ 4+ 4+  Hip abduction 3+ 4+ 4+ 4+ 4 5 5 5   Hip adduction 3+ 4+ 4+  4+ 4+ 4+ 4+ 5  Hip internal rotation          Hip external rotation          Knee flexion 3+ 4 4+ 4+ 4 4+ 4+ 4+  Knee extension 3 3+ 4- 4+ 4 4+ 4+ 4+  Ankle dorsiflexion 3+ 4- 4 4+ 4+ 4+ 4+ 4+  Ankle plantarflexion          Ankle inversion          Ankle eversion           (Blank rows = not tested)   FUNCTIONAL TESTS:  Initial eval: 5 times sit to stand: 24.06s with use of hands and felt 7/10 on BORG with this  03/23/24: 5 times sit to stand: 17.85 s with use of hands and felt 3/10 on BORG with this  05/18/24: 5 times sit to stand: 12.87 s with use of hands and felt 3/10 on BORG with this 3 MWT: 294.1 ft  07/07/24: 5 times sit to stand: 16.04 s with minimal use of hands no armrests 3 MWT: 400.9 ft   GAIT: Distance walked: 271' (completed last session) Assistive device utilized: Single point cane Level of assistance: Modified independence Comments: greatly decreased cadence, decreased step length and height at Rt leg                                                                                                                                TREATMENT DATE:  08/12/24 Patient was late to appointment due to having a flat tire. He wanted his 15 mins Treadmill x 10 min at 1/5 mph 15# unilateral farmer's carry x 1 lap around both gyms Sit to stand with overhead press with 10lb kb x 10 Standing dead lifts with 15lb kb 2 x 10 Pallof press at cable column 10# 2 x 12 each direction Standing row at  dual cable row 10# 2 x 12 Resisted walking with dual cable row (using both handles) 10# x 4 laps     08/10/24  Treadmill x 10 min at 1/5 mph Sit to stand with fwd press 15lb kb x 10 Sit to stand with overhead press with 10lb kb x 10 Standing dead lifts with 15lb kb 2 x 10 Standing row at dual cable row 10# 2 x 10 Pallof press at cable column 10# x 12 each direction Standing lateral band walks with red loop x 3 laps of approx 15 feet (seated rest breaks after each lap) 15# unilateral farmer's carry x 1 lap around both gyms     08/05/24  Treadmill x 10 min at 1/5 mph Sit to stand with fwd press 10lb kb x 10 Sit to stand with overhead press with 10lb kb x 10 Standing dead lifts with 10lb kb 2 x 10 Seated LAQ with 7lbs Seated clamshell x 20 with red loop Standing lateral band walks with red loop x 3 laps of approx  15 feet (seated rest breaks after each lap) Step up on 8 step without UE support 2 x 10 each LE   07/07/24  Treadmill x 5 min at 1/5 mph Re-assessment completed: see above Walking on treadmill and walking for 3 MWT : emphasized heel to toe progression and proper gait sequence.  Sit to stand  x 5 to assess need for push off Education on exercise plan outside of therapy visits and how critical it is to be diligent with this to successfully meet goals.   07/06/24  Nustep x 10 min level 5 Leg Pres (seat 9) 90# 2 x 10;  unilateral 50# x 10 bilateral  Sit to stand + chest press 15# x 10 in front of mirror for visual feedback Modified Deadlift 15# 2 x 10 in front of mirror for visual feedback Unilateral 15# farmer's carry x 2 laps from ortho gym to end of hall (switching hands at end of hall) Seated LAQ x 20 each LE with 6 lbs Seated biceps curls 7# 2 x 10 Seated alternating chest press 7# 2 x 10 bilateral  Sit to stand 2 x 10 in tandem with left leg back then 2 x 10 with right leg back  PATIENT EDUCATION:  Education details: [email or web address] Person educated: Patient Education  method: Explanation, Demonstration, Tactile cues, Verbal cues, and Handouts Education comprehension: verbalized understanding, returned demonstration, verbal cues required, tactile cues required, and needs further education  HOME EXERCISE PROGRAM: Access Code: [email or web address] URL: [email or web address] Date: 02/03/2024 Prepared by: Mliss  Exercises - Supine March  - 1 x daily - 7 x weekly - 2 sets - 10 reps - Bent Knee Fallouts  - 1 x daily - 7 x weekly - 2 sets - 10 reps - Supine Hip Adductor Squeeze with Small Ball  - 1 x daily - 7 x weekly - 2 sets - 10 reps - Sit to Stand  - 3 x daily - 7 x weekly - 1-2 sets - 5-10 reps - Hooklying Clamshell with Resistance  - 1 x daily - 3 x weekly - 2 sets - 10 reps  ASSESSMENT:  CLINICAL IMPRESSION: Limited treatment session due to patient being 30 mins late after having a flat tired. He verbalized being tired and sore after last treatment session. 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 Will continue to address these activities in remaining treatment sessions. Patient will benefit from skilled PT to address the below impairments and improve overall function.    Eval: Patient is a 43 y.o. male  who was seen today for physical therapy evaluation and treatment for Rt leg weakness. Pt has complex history of Burkitt lymphoma with chemo treatments now completed. Pt reports he was exhausted during his treatments and feels like he is very weak and fatigued now. Does have numbness in bil thighs only posterior and anteriorly and feels like his Rt leg gives out to the point of him having 5 falls in the last several months. Pt does demonstrate gait deficits, very slow cadence and heavy trunk lean onto [facility] which pt uses on RT hand reporting he has more able to catch himself with the cane him this hand if his knee gives. Pt found to have decreased strength bil LE but worse in Rt leg. Pt  demonstrated high fall risk based on 5xSTS and after completing this was very fatigued and needed several minutes to recover stating 7/10 on BORG scale. LEFS score  22 / 80. 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   OBJECTIVE IMPAIRMENTS: Abnormal gait, decreased activity tolerance, decreased balance, decreased coordination, decreased endurance, decreased knowledge of use of DME, decreased mobility, difficulty walking, decreased strength, increased fascial restrictions, impaired perceived functional ability, increased muscle spasms, impaired flexibility, impaired sensation, improper body mechanics, postural dysfunction, and pain.   ACTIVITY LIMITATIONS: carrying, lifting, bending, sitting, standing, squatting, stairs, transfers, bed mobility, and locomotion level  PARTICIPATION LIMITATIONS: cleaning, laundry, interpersonal relationship, driving, shopping, community activity, occupation, and yard work  PERSONAL FACTORS: Fitness, Time since onset of injury/illness/exacerbation, and 1 comorbidity: medical history  are also affecting patient's functional outcome.   REHAB POTENTIAL: Good  CLINICAL DECISION MAKING: Evolving/moderate complexity  EVALUATION COMPLEXITY: Moderate   GOALS: Goals reviewed with patient? Yes  SHORT TERM GOALS: Target date: 02/25/24 Pt to be I with HEP.  Baseline: Goal status: In progress (compliance is poor)  2.  Pt to report improved LEFS score of 30 to improve toward lowering functional impairment limitations.  Baseline:  Goal status: MET 04/20/2024  3.  Pt to demonstrate ability to complete body weight squats without hands for 10 reps to improve LE strength for tolerance for walking.  Baseline:  Goal status: MET 03/23/24  4.  Pt to demonstrate improved gait mechanics with even step length and height to be able to decreased fall risk with community ambulation.   Baseline:  Goal  status: MET 03/23/24   LONG TERM GOALS: Target date: 05/18/24  Pt to be I with advanced HEP.  Baseline:  Goal status: MET 05/18/24  2.  Pt to demonstrate 10# squats without compensatory strategies for improved hip strength for tolerance to return to work Baseline:  Goal status: MET 08/05/24  3.  Pt to report ability to walk with or without AD for at least 30 mins with no increase in pain for tolerance to return to work.  Baseline:  Goal status: MET 08/05/24  4.  Pt to demonstrate 5xSTS in 7s to achieve age related norm for decreased fall risk Baseline:  Goal status: In Progress  5.  Pt to demonstrate at least 4+/5 bil hip strength for improved pelvic stability and functional squats without leakage.  Baseline:  Goal status: In Progress   PLAN:  PT FREQUENCY: 2x/week  PT DURATION: 8 weeks  PLANNED INTERVENTIONS: 97110-Therapeutic exercises, 97530- Therapeutic activity, 97112- Neuromuscular re-education, [phone]- Self Care, [id number]- Manual therapy, [phone]- Gait training, [phone]- Aquatic Therapy, [phone]- Electrical stimulation (manual), [id number]- Vasopneumatic device, [id number]- Ultrasound, [id number]- Ionotophoresis 4mg /ml Dexamethasone , Patient/Family education, Balance training, Stair training, Taping, Dry Needling, Joint mobilization, Spinal mobilization, Scar mobilization, DME instructions, Cryotherapy, Moist heat, and Biofeedback  PLAN FOR NEXT SESSION:  works tasks for rolling patients & transfers;standing balance, core strengthening, gait training,   Kristeen Sar, PT 08/12/24 11:57 AM [facility] [address] [place], [place] [address] Phone # [phone] Fax [phone]

## 2024-08-18 ENCOUNTER — Ambulatory Visit: Attending: Hematology | Admitting: Physical Therapy

## 2024-08-18 ENCOUNTER — Telehealth: Payer: Self-pay | Admitting: Physical Therapy

## 2024-08-18 DIAGNOSIS — R296 Repeated falls: Secondary | ICD-10-CM | POA: Insufficient documentation

## 2024-08-18 DIAGNOSIS — R262 Difficulty in walking, not elsewhere classified: Secondary | ICD-10-CM | POA: Insufficient documentation

## 2024-08-18 DIAGNOSIS — R252 Cramp and spasm: Secondary | ICD-10-CM | POA: Insufficient documentation

## 2024-08-18 DIAGNOSIS — M6281 Muscle weakness (generalized): Secondary | ICD-10-CM | POA: Insufficient documentation

## 2024-08-18 NOTE — Telephone Encounter (Signed)
 Spoke with patient. He had a death in his family and was not able to make appointment. He confirmed next scheduled appointment.  Kristeen Sar, PT 08/18/24 4:36 PM

## 2024-08-19 ENCOUNTER — Ambulatory Visit: Admitting: Physical Therapy

## 2024-08-24 ENCOUNTER — Ambulatory Visit

## 2024-08-26 ENCOUNTER — Telehealth: Payer: Self-pay

## 2024-08-26 ENCOUNTER — Ambulatory Visit

## 2024-08-26 NOTE — Telephone Encounter (Signed)
 Patient answered.  Forgot he had an appt.  Asked if he planned on attending his final visit.  He apologized and confirms he will be here for the appt on 08/31/24.

## 2024-08-27 ENCOUNTER — Other Ambulatory Visit: Payer: Self-pay

## 2024-08-27 ENCOUNTER — Other Ambulatory Visit: Payer: Self-pay | Admitting: Pharmacy Technician

## 2024-08-27 ENCOUNTER — Encounter: Payer: Self-pay | Admitting: Hematology

## 2024-08-27 NOTE — Progress Notes (Signed)
 Specialty Pharmacy Refill Coordination Note  Logan Ward is a 43 y.o. male contacted today regarding refills of specialty medication(s) Bictegravir-Emtricitab-Tenofov (Biktarvy )   Patient requested Delivery   Delivery date: 09/01/24   Verified address: [address]  [place]   Medication will be filled on 08/31/24.

## 2024-08-31 ENCOUNTER — Encounter: Payer: Self-pay | Admitting: Physical Therapy

## 2024-08-31 ENCOUNTER — Other Ambulatory Visit: Payer: Self-pay

## 2024-08-31 ENCOUNTER — Ambulatory Visit: Admitting: Physical Therapy

## 2024-08-31 DIAGNOSIS — R252 Cramp and spasm: Secondary | ICD-10-CM

## 2024-08-31 DIAGNOSIS — M6281 Muscle weakness (generalized): Secondary | ICD-10-CM

## 2024-08-31 DIAGNOSIS — R262 Difficulty in walking, not elsewhere classified: Secondary | ICD-10-CM

## 2024-08-31 DIAGNOSIS — R296 Repeated falls: Secondary | ICD-10-CM | POA: Diagnosis present

## 2024-08-31 NOTE — Therapy (Signed)
 OUTPATIENT PHYSICAL THERAPY LOWER EXTREMITY TREATMENT/ DISCHARGE NOTE  Patient Name: Logan Ward MRN: [id number] DOB:12/02/1981, 43 y.o., male Today's Date: 08/31/2024  END OF SESSION:  PT End of Session - 08/31/24 1307     Visit Number 34    Date for PT Re-Evaluation 09/01/24    Authorization Type Medicaid: [employer]    Authorization Time Period 06/21/24 through 09/01/24 16 visits    Authorization - Visit Number 7    Authorization - Number of Visits 16    Progress Note Due on Visit 40    PT Start Time 1148    PT Stop Time 1231    PT Time Calculation (min) 43 min    Activity Tolerance Patient tolerated treatment well    Behavior During Therapy WFL for tasks assessed/performed                   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)    HIV infection (HCC)    Internal hemorrhoids    Obesity    Rectal ulcer    Past Surgical History:  Procedure Laterality Date   IR IMAGING GUIDED PORT INSERTION  11/18/2022   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   Neutropenia (HCC) 06/26/2023   Rash 06/26/2023   Intractable back pain suspect Granix-related 02/21/2023   Lactic acidosis 02/21/2023   Thrombocytopenia (HCC) 02/01/2023   Hyponatremia 01/31/2023   Hypokalemia 01/23/2023   Hematuria 01/11/2023   Neutropenic fever (HCC) 01/10/2023   Pancytopenia (HCC) 01/10/2023   Acute cystitis 01/10/2023   Anemia 01/01/2023   Healthcare maintenance 12/19/2022   Encounter for chemotherapy management 12/12/2022   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HCC) 12/10/2022   Decreased appetite 12/02/2022   Drug-induced constipation 11/21/2022   Nausea without vomiting 11/21/2022   Acute nonintractable headache 11/21/2022   Encounter for antineoplastic chemotherapy 11/18/2022   Burkitt lymphoma of lymph nodes of multiple regions [facility]) 11/14/2022   Counseling regarding advance  care planning and goals of care 11/14/2022   Chest mass 11/10/2022   Exposure to chlamydia 10/24/2022   Submental lymphadenopathy 09/05/2022   Goals of care, counseling/discussion 07/26/2022   History of syphilis 07/26/2022   Neck swelling 07/26/2022   AIDS (acquired immune deficiency syndrome) (HCC) 07/25/2022    PCP: Logan Emaline Brink, MD  REFERRING PROVIDER: [name], MD  REFERRING DIAG: C83.78 (ICD-10-CM) - Burkitt lymphoma of lymph nodes of multiple regions [facility])  THERAPY DIAG:  Muscle weakness (generalized)  Abnormality of gait and mobility  Rationale for Evaluation and Treatment: Rehabilitation  ONSET DATE: 6-7 months  SUBJECTIVE:  SUBJECTIVE STATEMENT: Patient reports he is doing good today. He is ready for discharge  Eval: Rt leg weakness and gives out sometimes, cramps throughout the night and sometimes while walking. Uses SPC and this helps a lot with prevention of falling when it gives out.  No pattern with walking or standing time with weakness.  4-5 falls. Does have numbness and tingling in both legs throughout bil thighs Did finish cancer treatments last year, does still port. Chemo completed. Wasn't very active during chemo with exhausted and feels so weak now.    PAIN:  Are you having pain? Yes NPRS scale: 6/10 Pain location: low back  PRECAUTIONS: Other: recent cancer     RED FLAGS: None   WEIGHT BEARING RESTRICTIONS: No  FALLS:  Has patient fallen in last 6 months? Yes. Number of falls 5 (most recent Sunday)  03/23/24: No falls in the past 4 weeks  07/07/24: Logan Ward on 07/03/24 going up steps, states right leg gave out  LIVING ENVIRONMENT: Lives with: lives with their family Lives in: House/apartment Stairs: No Has following equipment at home:  Single point cane, FWW  OCCUPATION: off work right now, CNA  PLOF: Independent  PATIENT GOALS: to feel stronger and get back to how I was before cancer  PERTINENT HISTORY:  HIV/AIDs; syphilis, rapidly growing right cervical nodal mass extending into the level 2 nodal station of the right supraclavicular nodal station and extension into the superficial right sternocleidomastoid muscle.  There is also hypermetabolic right axillary nodal disease.diagnosed in November/December 2023 Sexual abuse: NO  NEXT MD VISIT: 04/05/24 - oncologist  OBJECTIVE:  Note: Objective measures were completed at Evaluation unless otherwise noted.  DIAGNOSTIC FINDINGS:   Xray - hip 01/27/24 Mild degenerative changes to the hip joint  CT NECK WITH CONTRAST 12/28/23  IMPRESSION: 1. Resolution of the large indistinctly marginated mass lesion throughout the anterior lower neck, previously more extensive on the right than the left. Mild scarring of the right sternocleidomastoid muscle but no evidence of recurrent mass lesion. 2. Stable appearance of level 1 lymph nodes, the largest showing short axis dimension of 1 cm. Previously seen mass/adenopathy in the right level 2 region has resolved without residual. Other cervical chain lymph nodes on both sides of the neck are within normal limits in size.   PATIENT SURVEYS:  Initial eval: LEFS Lower Extremity Functional Score: 22 / 80 = 27.5 % indicative of moderate functional limitation   03/23/24: LEFS Lower Extremity Functional Score: 12 / 80 = 15 % indicative of severe functional limitation  (Patient explains he paid more attention to the details of the questions)  04/20/2024 LEFS: 47/80= 58.8%  04/20/2024 LEFS: 56/80= 70.0%  COGNITION: Overall cognitive status: Within functional limits for tasks assessed     SENSATION: Numbness in both thighs worse with pain, can feel pressure but light touch   POSTURE: rounded shoulders and anterior pelvic  tilt  PALPATION: TTP at Rt greater trochanter, tightness noted in Rt hip flexor, Rt lumbar paraspinal  LOWER EXTREMITY ROM:  WFL without pain for full flexion however at end range extension pt reports pain at proximal lateral hip on Rt  LOWER EXTREMITY MMT:  MMT Right Eval */5 Right  03/23/24 Right 05/18/24 Right 07/07/24 Left eval Left 03/23/24 Left  05/18/24 Left 07/07/24  Hip flexion 3 3 3+ 4- 4+ 4+ 4+ 4+  Hip extension 3+ 3+ 4 4 4+ 4+ 4+ 4+  Hip abduction 3+ 4+ 4+ 4+ 4 5 5 5   Hip adduction 3+  4+ 4+ 4+ 4+ 4+ 4+ 5  Hip internal rotation          Hip external rotation          Knee flexion 3+ 4 4+ 4+ 4 4+ 4+ 4+  Knee extension 3 3+ 4- 4+ 4 4+ 4+ 4+  Ankle dorsiflexion 3+ 4- 4 4+ 4+ 4+ 4+ 4+  Ankle plantarflexion          Ankle inversion          Ankle eversion           (Blank rows = not tested)   FUNCTIONAL TESTS:  Initial eval: 5 times sit to stand: 24.06s with use of hands and felt 7/10 on BORG with this  03/23/24: 5 times sit to stand: 17.85 s with use of hands and felt 3/10 on BORG with this  05/18/24: 5 times sit to stand: 12.87 s with use of hands and felt 3/10 on BORG with this 3 MWT: 294.1 ft  07/07/24: 5 times sit to stand: 16.04 s with minimal use of hands no armrests 3 MWT: 400.9 ft   GAIT: Distance walked: 271' (completed last session) Assistive device utilized: Single point cane Level of assistance: Modified independence Comments: greatly decreased cadence, decreased step length and height at Rt leg                                                                                                                                TREATMENT DATE:  08/31/24  NuStep Level 6 6 mins- PT present to discuss status 5STS: 9.63 sec with UE support Standing dead lifts with 20lb kb X 12 20# unilateral farmer's carry x 2 lap around both gyms Pallof press at cable column 10#  x 15 each direction Standing row at dual cable row 10# 2 x 10 Treadmill x 5 min at  2.75mph    08/12/24 Patient was late to appointment due to having a flat tire. He wanted his 15 mins Treadmill x 10 min at 1/5 mph 15# unilateral farmer's carry x 1 lap around both gyms Sit to stand with overhead press with 10lb kb x 10 Standing dead lifts with 15lb kb 2 x 10 Pallof press at cable column 10# 2 x 12 each direction Standing row at dual cable row 10# 2 x 12 Resisted walking with dual cable row (using both handles) 10# x 4 laps     08/10/24  Treadmill x 10 min at 1/5 mph Sit to stand with fwd press 15lb kb x 10 Sit to stand with overhead press with 10lb kb x 10 Standing dead lifts with 15lb kb 2 x 10 Standing row at dual cable row 10# 2 x 10 Pallof press at cable column 10# x 12 each direction Standing lateral band walks with red loop x 3 laps of approx 15 feet (seated rest breaks after each lap) 15# unilateral farmer's carry x 1 lap around  both gyms     08/05/24  Treadmill x 10 min at 1/5 mph Sit to stand with fwd press 10lb kb x 10 Sit to stand with overhead press with 10lb kb x 10 Standing dead lifts with 10lb kb 2 x 10 Seated LAQ with 7lbs Seated clamshell x 20 with red loop Standing lateral band walks with red loop x 3 laps of approx 15 feet (seated rest breaks after each lap) Step up on 8 step without UE support 2 x 10 each LE    PATIENT EDUCATION:  Education details: [email or web address] Person educated: Patient Education method: Explanation, Demonstration, Tactile cues, Verbal cues, and Handouts Education comprehension: verbalized understanding, returned demonstration, verbal cues required, tactile cues required, and needs further education  HOME EXERCISE PROGRAM: Access Code: [email or web address] URL: [email or web address] Date: 08/31/2024 Prepared by: Kristeen Sar  Exercises - Supine March  - 1 x daily - 7 x weekly - 2 sets - 10 reps - Bent Knee Fallouts  - 1 x daily - 7 x weekly - 2 sets - 10 reps - Supine Hip Adductor Squeeze with Small Ball  - 1 x  daily - 7 x weekly - 2 sets - 10 reps - Sit to Stand  - 3 x daily - 7 x weekly - 1-2 sets - 5-10 reps - Hooklying Clamshell with Resistance  - 1 x daily - 3 x weekly - 2 sets - 10 reps - Supine Bridge  - 1 x daily - 7 x weekly - 2 sets - 10 reps - Half Deadlift with Kettlebell  - 1 x daily - 7 x weekly - 2 sets - 10 reps - Farmer's Carry with Kettlebells  - 1 x daily - 7 x weekly - 2 sets - Resisted Sit-to-Stand With Dumbbell at Chest  - 1 x daily - 7 x weekly - 2 sets - 10 reps - Standing Anti-Rotation Press with Anchored Resistance  - 1 x daily - 7 x weekly - 1 sets - 15 reps  ASSESSMENT:  CLINICAL IMPRESSION: Logan Ward has made great progress with skilled therapy. He has met majority of goals at this time. His only goal not met is his 5STS time. However, noted improved performance since last administration. He is in the progress of starting his return to work progress. Provided patient education and discussion on quality of life and initiating a regular exercise program. Discussed motivators, challenges, and goals for future.  Patient to discharge home with HEP.    Eval: Patient is a 43 y.o. male  who was seen today for physical therapy evaluation and treatment for Rt leg weakness. Pt has complex history of Burkitt lymphoma with chemo treatments now completed. Pt reports he was exhausted during his treatments and feels like he is very weak and fatigued now. Does have numbness in bil thighs only posterior and anteriorly and feels like his Rt leg gives out to the point of him having 5 falls in the last several months. Pt does demonstrate gait deficits, very slow cadence and heavy trunk lean onto [facility] which pt uses on RT hand reporting he has more able to catch himself with the cane him this hand if his knee gives. Pt found to have decreased strength bil LE but worse in Rt leg. Pt demonstrated high fall risk based on 5xSTS and after completing this was very fatigued and needed several minutes to  recover stating 7/10 on BORG scale. LEFS score 22 / 80. 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   OBJECTIVE IMPAIRMENTS: Abnormal gait, decreased activity tolerance, decreased balance, decreased coordination, decreased endurance, decreased knowledge of use of DME, decreased mobility, difficulty walking, decreased strength, increased fascial restrictions, impaired perceived functional ability, increased muscle spasms, impaired flexibility, impaired sensation, improper body mechanics, postural dysfunction, and pain.   ACTIVITY LIMITATIONS: carrying, lifting, bending, sitting, standing, squatting, stairs, transfers, bed mobility, and locomotion level  PARTICIPATION LIMITATIONS: cleaning, laundry, interpersonal relationship, driving, shopping, community activity, occupation, and yard work  PERSONAL FACTORS: Fitness, Time since onset of injury/illness/exacerbation, and 1 comorbidity: medical history  are also affecting patient's functional outcome.   REHAB POTENTIAL: Good  CLINICAL DECISION MAKING: Evolving/moderate complexity  EVALUATION COMPLEXITY: Moderate   GOALS: Goals reviewed with patient? Yes  SHORT TERM GOALS: Target date: 02/25/24 Pt to be I with HEP.  Baseline: Goal status: MET  2.  Pt to report improved LEFS score of 30 to improve toward lowering functional impairment limitations.  Baseline:  Goal status: MET 04/20/2024  3.  Pt to demonstrate ability to complete body weight squats without hands for 10 reps to improve LE strength for tolerance for walking.  Baseline:  Goal status: MET 03/23/24  4.  Pt to demonstrate improved gait mechanics with even step length and height to be able to decreased fall risk with community ambulation.   Baseline:  Goal status: MET 03/23/24   LONG TERM GOALS: Target date: 05/18/24  Pt to be I with advanced HEP.  Baseline:  Goal status: MET 05/18/24  2.  Pt to  demonstrate 10# squats without compensatory strategies for improved hip strength for tolerance to return to work Baseline:  Goal status: MET 08/05/24  3.  Pt to report ability to walk with or without AD for at least 30 mins with no increase in pain for tolerance to return to work.  Baseline:  Goal status: MET 08/05/24  4.  Pt to demonstrate 5xSTS in 7s to achieve age related norm for decreased fall risk Baseline:  Goal status: NOT MET( 8.63) 08/31/2024  5.  Pt to demonstrate at least 4+/5 bil hip strength for improved pelvic stability and functional squats without leakage.  Baseline:  Goal status: MET 07/07/2024   PLAN:  PT FREQUENCY: 2x/week  PT DURATION: 8 weeks  PLANNED INTERVENTIONS: 97110-Therapeutic exercises, 97530- Therapeutic activity, 97112- Neuromuscular re-education, 97535- Self Care, 02859- Manual therapy, [phone]- Gait training, [phone]- Aquatic Therapy, [id number]- Electrical stimulation (manual), [id number]- Vasopneumatic device, [id number]- Ultrasound, [id number]- Ionotophoresis 4mg /ml Dexamethasone , Patient/Family education, Balance training, Stair training, Taping, Dry Needling, Joint mobilization, Spinal mobilization, Scar mobilization, DME instructions, Cryotherapy, Moist heat, and Biofeedback  PLAN FOR NEXT SESSION:  patient to discharge home with HEP  Kristeen Sar, PT 08/31/24 1:08 PM [facility] [address], [address] [place], [place] [address] Phone # [phone] Fax [phone]  PHYSICAL THERAPY DISCHARGE SUMMARY  Visits from Start of Care: 34  Current functional level related to goals / functional outcomes: Goals partially met. Patient pleased with functional status.   Remaining deficits: None   Education / Equipment: See above   Patient agrees to discharge. Patient goals were partially met. Patient is being discharged due to being pleased with the current functional level.

## 2024-09-03 ENCOUNTER — Encounter: Payer: Self-pay | Admitting: Hematology

## 2024-09-13 ENCOUNTER — Inpatient Hospital Stay

## 2024-09-13 ENCOUNTER — Telehealth: Payer: Self-pay

## 2024-09-13 ENCOUNTER — Inpatient Hospital Stay: Attending: Hematology

## 2024-09-13 NOTE — Telephone Encounter (Signed)
 Pt came in to sign an ROI for his medical Record.

## 2024-09-23 ENCOUNTER — Other Ambulatory Visit: Payer: Self-pay

## 2024-09-29 ENCOUNTER — Ambulatory Visit

## 2024-09-29 ENCOUNTER — Other Ambulatory Visit: Payer: Self-pay

## 2024-09-29 ENCOUNTER — Encounter: Payer: Self-pay | Admitting: Hematology

## 2024-09-29 NOTE — Progress Notes (Signed)
 Specialty Pharmacy Refill Coordination Note  Logan Ward is a 43 y.o. male contacted today regarding refills of specialty medication(s) Bictegravir-Emtricitab-Tenofov (Biktarvy )   Patient requested Delivery   Delivery date: 10/05/24   Verified address: [address]  [name]   Medication will be filled on 10/04/24.

## 2024-10-04 ENCOUNTER — Other Ambulatory Visit: Payer: Self-pay

## 2024-10-05 ENCOUNTER — Other Ambulatory Visit: Payer: Self-pay

## 2024-10-05 NOTE — Progress Notes (Signed)
 Specialty Pharmacy Ongoing Clinical Assessment Note  Logan Ward is a 43 y.o. male who is being followed by the specialty pharmacy service for RxSp HIV   Patient's specialty medication(s) reviewed today: Bictegravir-Emtricitab-Tenofov (Biktarvy )   Missed doses in the last 4 weeks: 0   Patient/Caregiver did not have any additional questions or concerns.   Therapeutic benefit summary: Patient is achieving benefit   Adverse events/side effects summary: No adverse events/side effects   Patient's therapy is appropriate to: Continue    Goals Addressed             This Visit's Progress    Achieve Undetectable HIV Viral Load < 20   Improving    Patient is on track. Patient will maintain adherence. Patient's most recent viral load was 74 copies.mL, as of 07/08/24         Follow up: 12 months  [facility] Specialty Pharmacist

## 2024-10-18 ENCOUNTER — Encounter: Payer: Self-pay | Admitting: Radiology

## 2024-10-21 ENCOUNTER — Ambulatory Visit: Admitting: Nurse Practitioner

## 2024-10-21 ENCOUNTER — Encounter: Payer: Self-pay | Admitting: Nurse Practitioner

## 2024-10-21 VITALS — BP 136/70 | HR 72 | Temp 97.9°F | Ht 73.0 in | Wt 296.6 lb

## 2024-10-21 DIAGNOSIS — E6609 Other obesity due to excess calories: Secondary | ICD-10-CM | POA: Diagnosis not present

## 2024-10-21 DIAGNOSIS — Z6839 Body mass index (BMI) 39.0-39.9, adult: Secondary | ICD-10-CM

## 2024-10-21 DIAGNOSIS — E66812 Obesity, class 2: Secondary | ICD-10-CM | POA: Insufficient documentation

## 2024-10-21 DIAGNOSIS — Z0001 Encounter for general adult medical examination with abnormal findings: Secondary | ICD-10-CM | POA: Diagnosis not present

## 2024-10-21 NOTE — Progress Notes (Signed)
 Complete physical exam  Patient: Logan Ward   DOB: 27-Oct-1981   43 y.o. Male  MRN: [id number] Visit Date: 10/21/2024  Subjective:    Chief Complaint  Patient presents with   Establish Care    New patient FASTING no concerns    Logan Ward is a 43 y.o. male who presents today for a complete physical exam. He reports consuming a general diet. No exercise regimen He generally feels well. He reports sleeping well. He does not have additional problems to discuss today.  Vision:Yes Dental:No STD Screen:No  BP Readings from Last 3 Encounters:  10/21/24 136/70  08/02/24 121/75  01/05/24 124/74   Wt Readings from Last 3 Encounters:  10/21/24 296 lb 9.6 oz (134.5 kg)  08/02/24 297 lb (134.7 kg)  01/05/24 297 lb 8 oz (134.9 kg)   Most recent fall risk assessment:    10/21/2024   10:34 AM  Fall Risk   Falls in the past year? 1  Number falls in past yr: 1  Injury with Fall? 0  Risk for fall due to : History of fall(s)  Follow up Falls evaluation completed    Depression screen:Yes - No Depression Most recent depression screenings:    10/21/2024   10:34 AM 09/26/2023   11:53 AM  PHQ 2/9 Scores  PHQ - 2 Score 0 0  PHQ- 9 Score 1       Data saved with a previous flowsheet row definition   HPI  Class 2 obesity due to excess calories without serious comorbidity with body mass index (BMI) of 39.0 to 39.9 in adult No previous weight loss attempt. Struggles with making healthy food choices. Admits he does not cook, and not too fan of vegetables or fruit. No exercise regimen Wt Readings from Last 3 Encounters:  10/21/24 296 lb 9.6 oz (134.5 kg)  08/02/24 297 lb (134.7 kg)  01/05/24 297 lb 8 oz (134.9 kg)    We discussed ways to make diet modifications. 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)    HIV infection (HCC)    Internal hemorrhoids    Obesity    Rectal ulcer    Past Surgical History:  Procedure Laterality Date   IR IMAGING GUIDED PORT INSERTION  11/18/2022   Social History   Socioeconomic History   Marital status: Significant Other    Spouse name: Not on file   Number of children: Not on file   Years of education: Not on file   Highest education level: Not on file  Occupational History   Not on file  Tobacco Use   Smoking status: Never   Smokeless tobacco: Never  Vaping Use   Vaping status: Never Used  Substance and Sexual Activity   Alcohol use: No   Drug use: No   Sexual activity: Yes    Comment: declined condoms  Other Topics Concern   Not on file  Social History Narrative   Not on file   Social Drivers of Health   Financial Resource Strain: Low Risk  (10/24/2022)   Overall Financial Resource Strain (CARDIA)    Difficulty of Paying Living Expenses: Not hard at all  Food Insecurity: Food Insecurity Present (02/26/2024)   Hunger Vital Sign    Worried About Running  Out of Food in the Last Year: Sometimes true    Ran Out of Food in the Last Year: Sometimes true  Transportation Needs: No Transportation Needs (03/03/2023)   PRAPARE - Administrator, Civil Service (Medical): No    Lack of Transportation (Non-Medical): No  Physical Activity: Sufficiently Active (10/24/2022)   Exercise Vital Sign    Days of Exercise per Week: 3 days    Minutes of Exercise per Session: 60 min  Recent Concern: Physical Activity - Insufficiently Active (10/24/2022)   Exercise Vital Sign    Days of Exercise per Week: 3 days    Minutes of Exercise per Session: 20 min  Stress: No Stress Concern Present (10/24/2022)   [employer] of Occupational Health - Occupational Stress Questionnaire    Feeling of Stress : Not at all  Social Connections: Unknown (10/24/2022)   Social Connection and Isolation Panel     Frequency of Communication with Friends and Family: Twice a week    Frequency of Social Gatherings with Friends and Family: Twice a week    Attends Religious Services: 1 to 4 times per year    Active Member of [employer] or Organizations: No    Attends Banker Meetings: 1 to 4 times per year    Marital Status: Patient declined  Intimate Partner Violence: Not At Risk (03/03/2023)   Humiliation, Afraid, Rape, and Kick questionnaire    Fear of Current or Ex-Partner: No    Emotionally Abused: No    Physically Abused: No    Sexually Abused: No   Family Status  Relation Name Status   Mother  Alive   Father  Deceased   Sister  Alive   Brother  Deceased   Brother  Alive   Mat Aunt  Alive   Pat Aunt  Deceased   Pat Uncle  Alive   MGM  Deceased   MGF  Deceased   PGM  Deceased   PGF  Deceased   Neg Hx  (Not Specified)  No partnership data on file   Family History  Problem Relation Age of Onset   Cancer Father 45       throat cancer secondary to tobacco use   Alcohol abuse Father    Hypertension Father    Throat cancer Father    Cancer Sister    Cancer Brother 48       pancreatic cancer   Heart attack Brother    Cancer Maternal Aunt        breast cancer   Depression Paternal Aunt    Hypertension Paternal Aunt    Hypertension Paternal Uncle    Depression Paternal Uncle    Colon cancer Neg Hx    Esophageal cancer Neg Hx    Stomach cancer Neg Hx    No Known Allergies  Patient Care Team: Shai Mckenzie, Roselie Rockford, NP as PCP - General (Internal Medicine) [name], MD as Consulting Physician (Hematology)   Medications: Outpatient Medications Prior to Visit  Medication Sig   B Complex-C (B-COMPLEX WITH VITAMIN C ) tablet Take 1 tablet by mouth daily.   bictegravir-emtricitabine -tenofovir  AF (BIKTARVY ) 50-200-25 MG TABS tablet Take 1 tablet by mouth daily. Patient needs appt   HYDROcodone -acetaminophen  (NORCO/VICODIN) 5-325 MG tablet Take 1 tablet by mouth every 6  (six) hours as needed.   ibuprofen  (ADVIL ) 600 MG tablet Take 1 tablet (600 mg total) by mouth every 6 (six) hours as needed.   [DISCONTINUED] ketoconazole  (NIZORAL ) 2 % cream Apply  topically 2 times daily for 2 weeks (Patient not taking: Reported on 10/21/2024)   No facility-administered medications prior to visit.    Review of Systems  Constitutional:  Negative for activity change, appetite change and unexpected weight change.  Respiratory: Negative.    Cardiovascular: Negative.   Gastrointestinal: Negative.   Endocrine: Negative for cold intolerance and heat intolerance.  Genitourinary: Negative.   Musculoskeletal: Negative.   Skin: Negative.   Neurological: Negative.   Hematological: Negative.   Psychiatric/Behavioral:  Negative for behavioral problems, decreased concentration, dysphoric mood, hallucinations, self-injury, sleep disturbance and suicidal ideas. The patient is not nervous/anxious.     Last CBC Lab Results  Component Value Date   WBC 4.2 07/12/2024   HGB 12.4 (L) 07/12/2024   HCT 38.7 (L) 07/12/2024   MCV 87.4 07/12/2024   MCH 28.0 07/12/2024   RDW 12.9 07/12/2024   PLT 230 07/12/2024   Last metabolic panel Lab Results  Component Value Date   GLUCOSE 93 07/12/2024   NA 139 07/12/2024   K 4.4 07/12/2024   CL 108 07/12/2024   CO2 27 07/12/2024   BUN 13 07/12/2024   CREATININE 1.01 07/12/2024   GFRNONAA >60 07/12/2024   CALCIUM 9.2 07/12/2024   PHOS 3.4 03/04/2023   PROT 8.1 07/12/2024   ALBUMIN 4.2 07/12/2024   BILITOT 0.7 07/12/2024   ALKPHOS 64 07/12/2024   AST 17 07/12/2024   ALT 18 07/12/2024   ANIONGAP 4 (L) 07/12/2024   Last lipids Lab Results  Component Value Date   CHOL 95 07/08/2024   HDL 35 (L) 07/08/2024   LDLCALC 43 07/08/2024   TRIG 86 07/08/2024   CHOLHDL 2.7 07/08/2024   Last thyroid functions Lab Results  Component Value Date   TSH 1.231 01/12/2023       Objective:  BP 136/70 (BP Location: Left Arm, Patient Position:  Sitting, Cuff Size: Large)   Pulse 72   Temp 97.9 F (36.6 C) (Oral)   Ht 6' 1 (1.854 m)   Wt 296 lb 9.6 oz (134.5 kg)   SpO2 97%   BMI 39.13 kg/m     Physical Exam Vitals and nursing note reviewed.  Constitutional:      General: He is not in acute distress.    Appearance: He is obese.  HENT:     Right Ear: Tympanic membrane, ear canal and external ear normal.     Left Ear: Tympanic membrane, ear canal and external ear normal.     Nose: Nose normal.  Eyes:     Extraocular Movements: Extraocular movements intact.     Conjunctiva/sclera: Conjunctivae normal.     Pupils: Pupils are equal, round, and reactive to light.  Neck:     Thyroid: No thyroid mass, thyromegaly or thyroid tenderness.  Cardiovascular:     Rate and Rhythm: Normal rate and regular rhythm.     Pulses: Normal pulses.     Heart sounds: Normal heart sounds.  Pulmonary:     Effort: Pulmonary effort is normal.     Breath sounds: Normal breath sounds.  Abdominal:     General: Bowel sounds are normal.     Palpations: Abdomen is soft.  Musculoskeletal:        General: Normal range of motion.     Cervical back: Normal range of motion and neck supple.     Right lower leg: No edema.     Left lower leg: No edema.  Lymphadenopathy:     Cervical: No cervical adenopathy.  Skin:    General: Skin is warm and dry.  Neurological:     Mental Status: He is alert and oriented to person, place, and time.     Cranial Nerves: No cranial nerve deficit.  Psychiatric:        Mood and Affect: Mood normal.        Behavior: Behavior normal.        Thought Content: Thought content normal.      No results found for any visits on 10/21/24.    Assessment & Plan:    Routine Health Maintenance and Physical Exam  Immunization History  Administered Date(s) Administered   DTP 07/23/1988   Hep B, Unspecified 09/22/1995, 05/05/1996, 09/13/1996   OPV 07/23/1988   PFIZER(Purple Top)SARS-COV-2 Vaccination 03/13/2020, 04/03/2020    PPD Test 08/17/2017, 03/07/2021, 03/23/2021   Pfizer Covid-19 Vaccine Bivalent Booster 60yrs & up 12/29/2021   Health Maintenance  Topic Date Due   COVID-19 Vaccine (4 - 2025-26 season) 08/16/2024   Influenza Vaccine  03/15/2025 (Originally 07/16/2024)   Pneumococcal Vaccine (1 of 2 - PCV) 10/21/2025 (Originally 06/19/2002)   HPV VACCINES (1 - Risk 3-dose SCDM series) 10/21/2025 (Originally 06/19/2010)   Hepatitis C Screening  Completed   HIV Screening  Completed   Meningococcal B Vaccine  Aged Out   DTaP/Tdap/Td  Discontinued   Discussed health benefits of physical activity, and encouraged him to engage in regular exercise appropriate for his age and condition.  Problem List Items Addressed This Visit     Class 2 obesity due to excess calories without serious comorbidity with body mass index (BMI) of 39.0 to 39.9 in adult   No previous weight loss attempt. Struggles with making healthy food choices. Admits he does not cook, and not too fan of vegetables or fruit. No exercise regimen Wt Readings from Last 3 Encounters:  10/21/24 296 lb 9.6 oz (134.5 kg)  08/02/24 297 lb (134.7 kg)  01/05/24 297 lb 8 oz (134.9 kg)    We discussed ways to make diet modifications. Provided printed information He agreed to nutrition referral Encouraged to start daily low impact exercise F/up in 51month      Relevant Orders   Referral to Nutrition and Diabetes Services   Other Visit Diagnoses       Encounter for preventative adult health care exam with abnormal findings    -  Primary      Return in about 4 weeks (around 11/18/2024) for Weight management.     Roselie Mood, NP

## 2024-10-21 NOTE — Assessment & Plan Note (Signed)
 No previous weight loss attempt. Struggles with making healthy food choices. Admits he does not cook, and not too fan of vegetables or fruit. No exercise regimen Wt Readings from Last 3 Encounters:  10/21/24 296 lb 9.6 oz (134.5 kg)  08/02/24 297 lb (134.7 kg)  01/05/24 297 lb 8 oz (134.9 kg)    We discussed ways to make diet modifications. Provided printed information He agreed to nutrition referral Encouraged to start daily low impact exercise F/up in 4month

## 2024-10-21 NOTE — Patient Instructions (Signed)
 Thank you for choosing [facility] You will be contacted to schedule appointment with nutritionist.  DASH Eating Plan DASH stands for Dietary Approaches to Stop Hypertension. The DASH eating plan is a healthy eating plan that has been shown to: Lower high blood pressure (hypertension). Reduce your risk for type 2 diabetes, heart disease, and stroke. Help with weight loss. What are tips for following this plan? Reading food labels Check food labels for the amount of salt (sodium) per serving. Choose foods with less than 5 percent of the Daily Value (DV) of sodium. In general, foods with less than 300 milligrams (mg) of sodium per serving fit into this eating plan. To find whole grains, look for the word whole as the first word in the ingredient list. Shopping Buy products labeled as low-sodium or no salt added. Buy fresh foods. Avoid canned foods and pre-made or frozen meals. Cooking Try not to add salt when you cook. Use salt-free seasonings or herbs instead of table salt or sea salt. Check with your health care provider or pharmacist before using salt substitutes. Do not fry foods. Cook foods in healthy ways, such as baking, boiling, grilling, roasting, or broiling. Cook using oils that are good for your heart. These include olive, canola, avocado, soybean, and sunflower oil. Meal planning  Eat a balanced diet. This should include: 4 or more servings of fruits and 4 or more servings of vegetables each day. Try to fill half of your plate with fruits and vegetables. 6-8 servings of whole grains each day. 6 or less servings of lean meat, poultry, or fish each day. 1 oz is 1 serving. A 3 oz (85 g) serving of meat is about the same size as the palm of your hand. One egg is 1 oz (28 g). 2-3 servings of low-fat dairy each day. One serving is 1 cup (237 mL). 1 serving of nuts, seeds, or beans 5 times each week. 2-3 servings of heart-healthy fats. Healthy fats called omega-3 fatty  acids are found in foods such as walnuts, flaxseeds, fortified milks, and eggs. These fats are also found in cold-water  fish, such as sardines, salmon, and mackerel. Limit how much you eat of: Canned or prepackaged foods. Food that is high in trans fat, such as fried foods. Food that is high in saturated fat, such as fatty meat. Desserts and other sweets, sugary drinks, and other foods with added sugar. Full-fat dairy products. Do not salt foods before eating. Do not eat more than 4 egg yolks a week. Try to eat at least 2 vegetarian meals a week. Eat more home-cooked food and less restaurant, buffet, and fast food. Lifestyle When eating at a restaurant, ask if your food can be made with less salt or no salt. If you drink alcohol: Limit how much you have to: 0-1 drink a day if you are male. 0-2 drinks a day if you are male. Know how much alcohol is in your drink. In the [place] one drink is one 12 oz bottle of beer (355 mL), one 5 oz glass of wine (148 mL), or one 1 oz glass of hard liquor (44 mL). General information Avoid eating more than 2,300 mg of salt a day. If you have hypertension, you may need to reduce your sodium intake to 1,500 mg a day. Work with your provider to stay at a healthy body weight or lose weight. Ask what the best weight range is for you. On most days of the week, get at least  30 minutes of exercise that causes your heart to beat faster. This may include walking, swimming, or biking. Work with your provider or dietitian to adjust your eating plan to meet your specific calorie needs. What foods should I eat? Fruits All fresh, dried, or frozen fruit. Canned fruits that are in their natural juice and do not have sugar added to them. Vegetables Fresh or frozen vegetables that are raw, steamed, roasted, or grilled. Low-sodium or reduced-sodium tomato and vegetable juice. Low-sodium or reduced-sodium tomato sauce and tomato paste. Low-sodium or reduced-sodium canned  vegetables. Grains Whole-grain or whole-wheat bread. Whole-grain or whole-wheat pasta. Brown rice. Mcneil Madeira. Bulgur. Whole-grain and low-sodium cereals. Pita bread. Low-fat, low-sodium crackers. Whole-wheat flour tortillas. Meats and other proteins Skinless chicken or turkey. Ground chicken or turkey. Pork with fat trimmed off. Fish and seafood. Egg whites. Dried beans, peas, or lentils. Unsalted nuts, nut butters, and seeds. Unsalted canned beans. Lean cuts of beef with fat trimmed off. Low-sodium, lean precooked or cured meat, such as sausages or meat loaves. Dairy Low-fat (1%) or fat-free (skim) milk. Reduced-fat, low-fat, or fat-free cheeses. Nonfat, low-sodium ricotta or cottage cheese. Low-fat or nonfat yogurt. Low-fat, low-sodium cheese. Fats and oils Soft margarine without trans fats. Vegetable oil. Reduced-fat, low-fat, or light mayonnaise and salad dressings (reduced-sodium). Canola, safflower, olive, avocado, soybean, and sunflower oils. Avocado. Seasonings and condiments Herbs. Spices. Seasoning mixes without salt. Other foods Unsalted popcorn and pretzels. Fat-free sweets. The items listed above may not be all the foods and drinks you can have. Talk to a dietitian to learn more. What foods should I avoid? Fruits Canned fruit in a light or heavy syrup. Fried fruit. Fruit in cream or butter sauce. Vegetables Creamed or fried vegetables. Vegetables in a cheese sauce. Regular canned vegetables that are not marked as low-sodium or reduced-sodium. Regular canned tomato sauce and paste that are not marked as low-sodium or reduced-sodium. Regular tomato and vegetable juices that are not marked as low-sodium or reduced-sodium. Dene. Olives. Grains Baked goods made with fat, such as croissants, muffins, or some breads. Dry pasta or rice meal packs. Meats and other proteins Fatty cuts of meat. Ribs. Fried meat. Aldona. Bologna, salami, and other precooked or cured meats, such as  sausages or meat loaves, that are not lean and low in sodium. Fat from the back of a pig (fatback). Bratwurst. Salted nuts and seeds. Canned beans with added salt. Canned or smoked fish. Whole eggs or egg yolks. Chicken or turkey with skin. Dairy Whole or 2% milk, cream, and half-and-half. Whole or full-fat cream cheese. Whole-fat or sweetened yogurt. Full-fat cheese. Nondairy creamers. Whipped toppings. Processed cheese and cheese spreads. Fats and oils Butter. Stick margarine. Lard. Shortening. Ghee. Bacon fat. Tropical oils, such as coconut, palm kernel, or palm oil. Seasonings and condiments Onion salt, garlic salt, seasoned salt, table salt, and sea salt. Worcestershire sauce. Tartar sauce. Barbecue sauce. Teriyaki sauce. Soy sauce, including reduced-sodium soy sauce. Steak sauce. Canned and packaged gravies. Fish sauce. Oyster sauce. Cocktail sauce. Store-bought horseradish. Ketchup. Mustard. Meat flavorings and tenderizers. Bouillon cubes. Hot sauces. Pre-made or packaged marinades. Pre-made or packaged taco seasonings. Relishes. Regular salad dressings. Other foods Salted popcorn and pretzels. The items listed above may not be all the foods and drinks you should avoid. Talk to a dietitian to learn more. Where to find more information National Heart, Lung, and Blood Institute (NHLBI): buffalodrycleaner.gl American Heart Association (AHA): heart.org Academy of Nutrition and Dietetics: eatright.org National Kidney Foundation (NKF): kidney.org This  information is not intended to replace advice given to you by your health care provider. Make sure you discuss any questions you have with your health care provider. Document Revised: 12/19/2022 Document Reviewed: 12/19/2022 Elsevier Patient Education  2024 Elsevier Inc.  How to Increase Your Level of Physical Activity Getting regular physical activity is important for your overall health and well-being. Most people do not get enough exercise. There are  easy ways to increase your level of physical activity, even if you have not been very active in the past or if you are just starting out. What are the benefits of physical activity? Physical activity has many short-term and long-term benefits. Being active on a regular basis can improve your physical and mental health as well as provide other benefits. Physical health benefits Helping you lose weight or maintain a healthy weight. Strengthening your muscles and bones. Reducing your risk of certain long-term (chronic) diseases, including heart disease, cancer, and diabetes. Being able to move around more easily and for longer periods of time without getting tired (increased endurance or stamina). Improving your ability to fight off illness (enhanced immunity). Being able to sleep better. Helping you stay healthy as you get older, including: Helping you stay mobile, or capable of walking and moving around. Preventing accidents, such as falls. Increasing life expectancy. Mental health benefits Boosting your mood and improving your self-esteem. Lowering your chance of having mental health problems, such as depression or anxiety. Helping you feel good about your body. Other benefits Finding new sources of fun and enjoyment. Meeting new people who share a common interest. Before you begin If you have a chronic illness or have not been active for a while, check with your health care provider about how to get started. Ask your health care provider what activities are safe for you. Start out slowly. Walking or doing some simple chair exercises is a good place to start, especially if you have not been active before or for a long time. Set goals that you can work toward. Ask your health care provider how much exercise is best for you. In general, most adults should: Do moderate-intensity exercise for at least 150 minutes each week (30 minutes on most days of the week) or vigorous exercise for at least  75 minutes each week, or a combination of these. Moderate-intensity exercise can include walking at a quick pace, biking, yoga, water  aerobics, or gardening. Vigorous exercise involves activities that take more effort, such as jogging or running, playing sports, swimming laps, or jumping rope. Do strength exercises on at least 2 days each week. This can include weight lifting, body weight exercises, and resistance-band exercises. How to be more physically active Make a plan  Try to find activities that you enjoy. You are more likely to commit to an exercise routine if it does not feel like a chore. If you have bone or joint problems, choose low-impact exercises, like walking or swimming. Use these tips for being successful with an exercise plan: Find a workout partner for accountability. Join a group or class, such as an aerobics class, cycling class, or sports team. Make family time active. Go for a walk, bike, or swim. Include a variety of exercises each week. Consider using a fitness tracker, such as a mobile phone app or a device worn like a watch, that will count the number of steps you take each day. Many people strive to reach 10,000 steps a day. Find ways to be active in your daily routines  Besides your formal exercise plans, you can find ways to do physical activity during your daily routines, such as: Walking or biking to work or to the store. Taking the stairs instead of the elevator. Parking farther away from the door at work or at the store. Planning walking meetings. Walking around while you are on the phone. Where to find more information Centers for Disease Control and Prevention: campuscasting.com.pt President's Council on Fitness, Sports & Nutrition: www.fitness.gov ChooseMyPlate: http://www.harvey.com/ Contact a health care provider if: You have headaches, muscle aches, or joint pain that is concerning. You feel dizzy or light-headed while exercising. You faint. You  feel your heart skipping, racing, or fluttering. You have chest pain while exercising. Summary Exercise benefits your mind and body at any age, even if you are just starting out. If you have a chronic illness or have not been active for a while, check with your health care provider before increasing your physical activity. Choose activities that are safe and enjoyable for you. Ask your health care provider what activities are safe for you. Start slowly. Tell your health care provider if you have problems as you start to increase your activity level. This information is not intended to replace advice given to you by your health care provider. Make sure you discuss any questions you have with your health care provider. Document Revised: 03/30/2021 Document Reviewed: 03/30/2021 Elsevier Patient Education  2024 [employer].

## 2024-10-27 ENCOUNTER — Other Ambulatory Visit: Payer: Self-pay

## 2024-10-29 ENCOUNTER — Other Ambulatory Visit: Payer: Self-pay

## 2024-10-29 NOTE — Progress Notes (Signed)
 Specialty Pharmacy Refill Coordination Note  Logan Ward is a 43 y.o. male contacted today regarding refills of specialty medication(s) Bictegravir-Emtricitab-Tenofov (Biktarvy )   Patient requested Delivery   Delivery date: 11/02/24   Verified address: [address]  [place]   Medication will be filled on: 11/01/24

## 2024-11-01 ENCOUNTER — Inpatient Hospital Stay: Admitting: Hematology

## 2024-11-01 ENCOUNTER — Inpatient Hospital Stay

## 2024-11-01 ENCOUNTER — Other Ambulatory Visit: Payer: Self-pay

## 2024-11-02 ENCOUNTER — Inpatient Hospital Stay: Attending: Hematology

## 2024-11-02 DIAGNOSIS — B20 Human immunodeficiency virus [HIV] disease: Secondary | ICD-10-CM | POA: Insufficient documentation

## 2024-11-02 DIAGNOSIS — C8371 Burkitt lymphoma, lymph nodes of head, face, and neck: Secondary | ICD-10-CM | POA: Insufficient documentation

## 2024-11-03 ENCOUNTER — Inpatient Hospital Stay

## 2024-11-03 DIAGNOSIS — C8371 Burkitt lymphoma, lymph nodes of head, face, and neck: Secondary | ICD-10-CM | POA: Diagnosis present

## 2024-11-03 DIAGNOSIS — B20 Human immunodeficiency virus [HIV] disease: Secondary | ICD-10-CM | POA: Diagnosis present

## 2024-11-03 DIAGNOSIS — C8378 Burkitt lymphoma, lymph nodes of multiple sites: Secondary | ICD-10-CM

## 2024-11-03 LAB — CBC WITH DIFFERENTIAL (CANCER CENTER ONLY)
Abs Immature Granulocytes: 0.01 K/uL (ref 0.00–0.07)
Basophils Absolute: 0 K/uL (ref 0.0–0.1)
Basophils Relative: 1 %
Eosinophils Absolute: 0 K/uL (ref 0.0–0.5)
Eosinophils Relative: 1 %
HCT: 40.4 % (ref 39.0–52.0)
Hemoglobin: 13.1 g/dL (ref 13.0–17.0)
Immature Granulocytes: 0 %
Lymphocytes Relative: 66 %
Lymphs Abs: 2.9 K/uL (ref 0.7–4.0)
MCH: 28.6 pg (ref 26.0–34.0)
MCHC: 32.4 g/dL (ref 30.0–36.0)
MCV: 88.2 fL (ref 80.0–100.0)
Monocytes Absolute: 0.4 K/uL (ref 0.1–1.0)
Monocytes Relative: 9 %
Neutro Abs: 1 K/uL — ABNORMAL LOW (ref 1.7–7.7)
Neutrophils Relative %: 23 %
Platelet Count: 129 K/uL — ABNORMAL LOW (ref 150–400)
RBC: 4.58 MIL/uL (ref 4.22–5.81)
RDW: 13 % (ref 11.5–15.5)
WBC Count: 4.3 K/uL (ref 4.0–10.5)
nRBC: 0 % (ref 0.0–0.2)

## 2024-11-03 LAB — CMP (CANCER CENTER ONLY)
ALT: 29 U/L (ref 0–44)
AST: 28 U/L (ref 15–41)
Albumin: 4.6 g/dL (ref 3.5–5.0)
Alkaline Phosphatase: 73 U/L (ref 38–126)
Anion gap: 10 (ref 5–15)
BUN: 12 mg/dL (ref 6–20)
CO2: 26 mmol/L (ref 22–32)
Calcium: 9.3 mg/dL (ref 8.9–10.3)
Chloride: 103 mmol/L (ref 98–111)
Creatinine: 1.14 mg/dL (ref 0.61–1.24)
GFR, Estimated: >60 mL/min (ref >60)
Glucose, Bld: 96 mg/dL (ref 70–99)
Potassium: 4.2 mmol/L (ref 3.5–5.1)
Sodium: 139 mmol/L (ref 135–145)
Total Bilirubin: 1.5 mg/dL — ABNORMAL HIGH (ref 0.0–1.2)
Total Protein: 7.6 g/dL (ref 6.5–8.1)

## 2024-11-15 ENCOUNTER — Inpatient Hospital Stay: Attending: Hematology | Admitting: Hematology

## 2024-11-24 ENCOUNTER — Other Ambulatory Visit: Payer: Self-pay

## 2024-11-24 ENCOUNTER — Encounter: Payer: Self-pay | Admitting: Hematology

## 2024-11-25 ENCOUNTER — Ambulatory Visit (INDEPENDENT_AMBULATORY_CARE_PROVIDER_SITE_OTHER): Admitting: Infectious Disease

## 2024-11-25 ENCOUNTER — Other Ambulatory Visit (HOSPITAL_COMMUNITY)
Admission: RE | Admit: 2024-11-25 | Discharge: 2024-11-25 | Disposition: A | Source: Ambulatory Visit | Attending: Infectious Disease | Admitting: Infectious Disease

## 2024-11-25 ENCOUNTER — Other Ambulatory Visit: Payer: Self-pay

## 2024-11-25 ENCOUNTER — Telehealth: Payer: Self-pay

## 2024-11-25 ENCOUNTER — Other Ambulatory Visit (HOSPITAL_COMMUNITY): Payer: Self-pay

## 2024-11-25 VITALS — BP 115/77 | HR 69 | Temp 97.9°F

## 2024-11-25 DIAGNOSIS — Z79899 Other long term (current) drug therapy: Secondary | ICD-10-CM | POA: Diagnosis not present

## 2024-11-25 DIAGNOSIS — B20 Human immunodeficiency virus [HIV] disease: Secondary | ICD-10-CM | POA: Insufficient documentation

## 2024-11-25 DIAGNOSIS — Z202 Contact with and (suspected) exposure to infections with a predominantly sexual mode of transmission: Secondary | ICD-10-CM | POA: Diagnosis present

## 2024-11-25 DIAGNOSIS — E785 Hyperlipidemia, unspecified: Secondary | ICD-10-CM

## 2024-11-25 MED ORDER — CEFTRIAXONE SODIUM 500 MG IJ SOLR
500.0000 mg | Freq: Once | INTRAMUSCULAR | Status: AC
  Administered 2024-11-25: 500 mg via INTRAMUSCULAR

## 2024-11-25 MED ORDER — DOXYCYCLINE HYCLATE 100 MG PO TABS
100.0000 mg | ORAL_TABLET | Freq: Two times a day (BID) | ORAL | 0 refills | Status: DC
  Filled 2024-11-25 (×2): qty 28, 14d supply, fill #0

## 2024-11-25 NOTE — Telephone Encounter (Signed)
 Pharmacy Patient Advocate Encounter  Insurance verification completed.   The patient is insured through [employer]   Ran test claim for Cabenuva. Currently a quantity of 6ml is a 30 day supply and the co-pay is 0.00 . Script can be filled with [facility]  This test claim was processed through [facility]- copay amounts may vary at other pharmacies due to pharmacy/plan contracts, or as the patient moves through the different stages of their insurance plan.

## 2024-11-25 NOTE — Progress Notes (Signed)
 Subjective:  Chief complaint: concerned re possible STI   Patient ID: Logan Ward, male    DOB: November 08, 1981, 43 y.o.   MRN: [id number]  HPI  Discussed the use of AI scribe software for clinical note transcription with the patient, who gave verbal consent to proceed.  History of Present Illness   Logan Ward is a 43 year old male with HIV who presents for STI testing.  He received a phone call from an individual who informed him that he had an STI and advised him to get tested and treated. He does not know the specific STI the individual has.  No symptoms such as burning, discharge, or rashes on his genitals. He occasionally notices a 'small little burn' that resolves spontaneously.  He is currently on Biktarvy  for HIV management.      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)    HIV infection (HCC)    Internal hemorrhoids    Obesity    Rectal ulcer     Past Surgical History:  Procedure Laterality Date   IR IMAGING GUIDED PORT INSERTION  11/18/2022    Family History  Problem Relation Age of Onset   Cancer Father 2       throat cancer secondary to tobacco use   Alcohol abuse Father    Hypertension Father    Throat cancer Father    Cancer Sister    Cancer Brother 51       pancreatic cancer   Heart attack Brother    Cancer Maternal Aunt        breast cancer   Depression Paternal Aunt    Hypertension Paternal Aunt    Hypertension Paternal Uncle    Depression Paternal Uncle    Colon cancer Neg Hx    Esophageal cancer Neg Hx    Stomach cancer Neg Hx       Social History   Socioeconomic History   Marital status: Significant Other    Spouse name: Not on file   Number of children: Not on file   Years of education: Not on file   Highest education level: Not on file  Occupational History   Not on file  Tobacco Use   Smoking status: Never   Smokeless  tobacco: Never  Vaping Use   Vaping status: Never Used  Substance and Sexual Activity   Alcohol use: No   Drug use: No   Sexual activity: Yes    Comment: declined condoms  Other Topics Concern   Not on file  Social History Narrative   Not on file   Social Drivers of Health   Tobacco Use: Low Risk (10/21/2024)   Patient History    Smoking Tobacco Use: Never    Smokeless Tobacco Use: Never    Passive Exposure: Not on file  Financial Resource Strain: Low Risk (10/24/2022)   Overall Financial Resource Strain (CARDIA)    Difficulty of Paying Living Expenses: Not hard at all  Food Insecurity: Food Insecurity Present (02/26/2024)   Epic    Worried About Programme Researcher, Broadcasting/film/video in the Last Year: Sometimes true    Ran Out of Food in the Last Year: Sometimes true  Transportation Needs: No Transportation Needs (03/03/2023)   PRAPARE - Administrator, Civil Service (Medical): No    Lack of Transportation (Non-Medical): No  Physical Activity: Sufficiently Active (  10/24/2022)   Exercise Vital Sign    Days of Exercise per Week: 3 days    Minutes of Exercise per Session: 60 min  Recent Concern: Physical Activity - Insufficiently Active (10/24/2022)   Exercise Vital Sign    Days of Exercise per Week: 3 days    Minutes of Exercise per Session: 20 min  Stress: No Stress Concern Present (10/24/2022)   [employer] of Occupational Health - Occupational Stress Questionnaire    Feeling of Stress : Not at all  Social Connections: Unknown (10/24/2022)   Social Connection and Isolation Panel    Frequency of Communication with Friends and Family: Twice a week    Frequency of Social Gatherings with Friends and Family: Twice a week    Attends Religious Services: 1 to 4 times per year    Active Member of Clubs or Organizations: No    Attends Banker Meetings: 1 to 4 times per year    Marital Status: Patient declined  Depression (PHQ2-9): Low Risk (10/21/2024)   Depression  (PHQ2-9)    PHQ-2 Score: 1  Alcohol Screen: Low Risk (10/24/2022)   Alcohol Screen    Last Alcohol Screening Score (AUDIT): 1  Housing: Low Risk (03/03/2023)   Housing    Last Housing Risk Score: 0  Utilities: Not At Risk (03/03/2023)   AHC Utilities    Threatened with loss of utilities: No  Health Literacy: Not on file    Allergies[1]  Current Medications[2]   Review of Systems  Constitutional:  Negative for activity change, appetite change, chills, diaphoresis, fatigue, fever and unexpected weight change.  HENT:  Negative for congestion, rhinorrhea, sinus pressure, sneezing, sore throat and trouble swallowing.   Eyes:  Negative for photophobia and visual disturbance.  Respiratory:  Negative for cough, chest tightness, shortness of breath, wheezing and stridor.   Cardiovascular:  Negative for chest pain, palpitations and leg swelling.  Gastrointestinal:  Negative for abdominal distention, abdominal pain, anal bleeding, blood in stool, constipation, diarrhea, nausea and vomiting.  Genitourinary:  Negative for difficulty urinating, dysuria, flank pain and hematuria.  Musculoskeletal:  Negative for arthralgias, back pain, gait problem, joint swelling and myalgias.  Skin:  Negative for color change, pallor, rash and wound.  Neurological:  Negative for dizziness, tremors, weakness and light-headedness.  Hematological:  Negative for adenopathy. Does not bruise/bleed easily.  Psychiatric/Behavioral:  Negative for agitation, behavioral problems, confusion, decreased concentration, dysphoric mood and sleep disturbance.        Objective:   Physical Exam Constitutional:      Appearance: He is well-developed.  HENT:     Head: Normocephalic and atraumatic.  Eyes:     Conjunctiva/sclera: Conjunctivae normal.  Cardiovascular:     Rate and Rhythm: Normal rate and regular rhythm.  Pulmonary:     Effort: Pulmonary effort is normal. No respiratory distress.     Breath sounds: No wheezing.   Abdominal:     General: There is no distension.     Palpations: Abdomen is soft.  Musculoskeletal:        General: No tenderness. Normal range of motion.     Cervical back: Normal range of motion and neck supple.  Skin:    General: Skin is warm and dry.     Coloration: Skin is not pale.     Findings: No erythema or rash.  Neurological:     General: No focal deficit present.     Mental Status: He is alert and oriented to person, place, and  time.  Psychiatric:        Mood and Affect: Mood normal.        Behavior: Behavior normal.        Thought Content: Thought content normal.        Judgment: Judgment normal.           Assessment & Plan:   Assessment and Plan    Sexually transmitted infection exposure Potential STI exposure without symptoms. Testing and treatment for gonorrhea, chlamydia, in urine and OP checked (denied receptiave anal intercourse) and syphilis warranted.  - Administered ceftriaxone  for gonorrhea. - Prescribed doxycycline  for possible chlamydia and syphilis. -checking urine and OP for GC and CHL, serum RPR  HIV disease Currently on Biktarvy . Discussed Cabenuva as an alternative, noting benefits and risks.  He understands the need to come for appts for on time injections and the 1% VF risk w on time injections  I will forward note to [facility] and to [facility] to see about obtaining it for him. I told him [facility] may wish for a genosure archive   Hyperlipidemia Recommended statin therapy due to increased cardiovascular risk in HIV-positive individuals over 40. -He seems open to this but I think it would be best if he talk to [place] as well since that is with whom he has a therapeutic relationship with          [1] No Known Allergies [2]  Current Outpatient Medications:    B Complex-C (B-COMPLEX WITH VITAMIN C ) tablet, Take 1 tablet by mouth daily., Disp: 30 tablet, Rfl: 11   bictegravir-emtricitabine -tenofovir  AF (BIKTARVY ) 50-200-25 MG TABS  tablet, Take 1 tablet by mouth daily. Patient needs appt, Disp: 30 tablet, Rfl: 5   HYDROcodone -acetaminophen  (NORCO/VICODIN) 5-325 MG tablet, Take 1 tablet by mouth every 6 (six) hours as needed., Disp: 12 tablet, Rfl: 0   ibuprofen  (ADVIL ) 600 MG tablet, Take 1 tablet (600 mg total) by mouth every 6 (six) hours as needed., Disp: 30 tablet, Rfl: 0

## 2024-11-26 ENCOUNTER — Other Ambulatory Visit: Payer: Self-pay

## 2024-11-26 ENCOUNTER — Other Ambulatory Visit (HOSPITAL_COMMUNITY): Payer: Self-pay

## 2024-11-26 LAB — CYTOLOGY, (ORAL, ANAL, URETHRAL) ANCILLARY ONLY
Chlamydia: NEGATIVE
Comment: NEGATIVE
Comment: NORMAL
Neisseria Gonorrhea: NEGATIVE

## 2024-11-26 LAB — URINE CYTOLOGY ANCILLARY ONLY
Chlamydia: NEGATIVE
Comment: NEGATIVE
Comment: NORMAL
Neisseria Gonorrhea: NEGATIVE

## 2024-11-26 NOTE — Progress Notes (Signed)
 Specialty Pharmacy Refill Coordination Note  Spoke with Marsh Heckler is a 43 y.o. male contacted today regarding refills of specialty medication(s) Bictegravir-Emtricitab-Tenofov (Biktarvy )  Doses on hand: 9  Patient requested: Delivery   Delivery date: 11/30/24   Verified address: [address] [place] [id number]  Medication will be filled on 11/29/24

## 2024-11-29 ENCOUNTER — Telehealth: Payer: Self-pay | Admitting: Pharmacist

## 2024-11-29 ENCOUNTER — Other Ambulatory Visit: Payer: Self-pay

## 2024-11-29 NOTE — Telephone Encounter (Signed)
 Dr. Fleeta Rothman reached out to pharmacy team after patient's recent STI visit as patient is interested in [place]. He seems to be consistent with appointments and has regular fill history. My only hesitation is fluctuating viral load with most recent value now 68 which is undetectable. Prior to this, viral load was 74 in July and 105 the year before. I think it would be reasonable to start patient with close monitoring.   We could have the Cabenuva here for his appointment in January if you would like. Or you could further discuss with him in January and then decide on scheduling first injection. Let me know your thoughts.  Logan Ward

## 2024-11-30 ENCOUNTER — Encounter: Payer: Self-pay | Admitting: Pharmacist

## 2024-11-30 LAB — SYPHILIS: RPR W/REFLEX TO RPR TITER AND TREPONEMAL ANTIBODIES, TRADITIONAL SCREENING AND DIAGNOSIS ALGORITHM: RPR Ser Ql: REACTIVE — AB

## 2024-11-30 LAB — HIV-1 RNA QUANT-NO REFLEX-BLD
HIV 1 RNA Quant: 43 {copies}/mL — ABNORMAL HIGH
HIV-1 RNA Quant, Log: 1.63 {Log_copies}/mL — ABNORMAL HIGH

## 2024-11-30 LAB — T PALLIDUM AB: T Pallidum Abs: POSITIVE — AB

## 2024-11-30 LAB — RPR TITER: RPR Titer: 1:4 {titer} — ABNORMAL HIGH

## 2024-11-30 NOTE — Telephone Encounter (Signed)
 Thank you! LVM and sent MyChart with patient to discuss.

## 2024-12-13 NOTE — Telephone Encounter (Signed)
 Haven't heard back from patient about Cabenuva, so I will let y'all discuss at appointment on Friday.

## 2024-12-17 ENCOUNTER — Other Ambulatory Visit (HOSPITAL_BASED_OUTPATIENT_CLINIC_OR_DEPARTMENT_OTHER): Payer: Self-pay

## 2024-12-17 ENCOUNTER — Other Ambulatory Visit: Payer: Self-pay

## 2024-12-17 ENCOUNTER — Encounter: Payer: Self-pay | Admitting: Family

## 2024-12-17 ENCOUNTER — Other Ambulatory Visit (HOSPITAL_COMMUNITY): Payer: Self-pay

## 2024-12-17 ENCOUNTER — Ambulatory Visit: Admitting: Family

## 2024-12-17 ENCOUNTER — Encounter: Payer: Self-pay | Admitting: Hematology

## 2024-12-17 VITALS — BP 119/77 | HR 64 | Temp 98.1°F | Wt 302.0 lb

## 2024-12-17 DIAGNOSIS — B20 Human immunodeficiency virus [HIV] disease: Secondary | ICD-10-CM | POA: Diagnosis present

## 2024-12-17 DIAGNOSIS — A5149 Other secondary syphilitic conditions: Secondary | ICD-10-CM | POA: Diagnosis not present

## 2024-12-17 DIAGNOSIS — Z0000 Encounter for general adult medical examination without abnormal findings: Secondary | ICD-10-CM

## 2024-12-17 MED ORDER — BIKTARVY 50-200-25 MG PO TABS
1.0000 | ORAL_TABLET | Freq: Every day | ORAL | 5 refills | Status: AC
  Filled 2024-12-17 – 2024-12-22 (×3): qty 30, 30d supply, fill #0
  Filled 2025-01-19: qty 30, 30d supply, fill #1

## 2024-12-17 MED ORDER — DOXYCYCLINE HYCLATE 100 MG PO TABS
ORAL_TABLET | ORAL | 1 refills | Status: AC
  Filled 2024-12-17: qty 30, 15d supply, fill #0

## 2024-12-17 NOTE — Assessment & Plan Note (Signed)
 Syphilis titer improved from 1:128 down to 1:4 indicating successful treatment. He did complete recent course of doxycycline  as well. No additional treatment needed at this time. Counseled on RPR testing results and when treatment would be considered.

## 2024-12-17 NOTE — Progress Notes (Signed)
 "   Brief Narrative   Patient ID: Logan Ward, male    DOB: 1981/06/22, 44 y.o.   MRN: [id number]  Logan Ward is a 44 y/o AA male diagnosed with HIV disease in December 2020 and starting care after ED visit on 07/25/22 with initial viral load of 54,600 and CD4 count of 115.  Entered care at [facility] Stage 3. Risk factor for HIV acquisition is bisexual contact. Genotype with no significant medication resistant mutations. Developed Burkitt's lymphoma and currently completed therapy.. Started on Biktarvy  and Bactrim .     Subjective:   Chief Complaint  Patient presents with   Follow-up    B20    HPI:  Logan Ward is a 44 y.o. male with HIV disease last seen on 07/08/24 by Alan Geralds, PharmD, CPP with well controlled virus and good adherence and tolerance to Biktarvy . Viral load was undetectable and CD4 count 297. Syphilis titer was 1:128 down from 1:256. Most recent lab work on 11/25/24 with viral load that is undetectable and RPR titer improved to 1:4 indicating successful syphilis treatment. Seen by Dr. Fleeta Rothman on 11/25/24 with discussion about Cabenuva. Here today for follow up.   Logan Ward has been doing well since his last office visit and continues to take Biktarvy  as prescribed with no adverse side effects or problems obtaining medication from the pharmacy.  Covered by Medicaid.  Has questions about long-acting injectable medications following last office visit.  Working full-time with stable housing, transportation, and access to food.  Healthcare maintenance reviewed.  Denies fevers, chills, night sweats, headaches, changes in vision, neck pain/stiffness, nausea, diarrhea, vomiting, lesions or rashes.  Lab Results  Component Value Date   CD4TCELL 14 (L) 07/08/2024   CD4TABS 297 (L) 07/08/2024   Lab Results  Component Value Date   HIV1RNAQUANT 43 (H) 11/25/2024     Allergies[1]    Outpatient Medications Prior to Visit  Medication Sig Dispense Refill   B Complex-C (B-COMPLEX  WITH VITAMIN C ) tablet Take 1 tablet by mouth daily. 30 tablet 11   HYDROcodone -acetaminophen  (NORCO/VICODIN) 5-325 MG tablet Take 1 tablet by mouth every 6 (six) hours as needed. 12 tablet 0   ibuprofen  (ADVIL ) 600 MG tablet Take 1 tablet (600 mg total) by mouth every 6 (six) hours as needed. 30 tablet 0   bictegravir-emtricitabine -tenofovir  AF (BIKTARVY ) 50-200-25 MG TABS tablet Take 1 tablet by mouth daily. Patient needs appt 30 tablet 5   doxycycline  (VIBRA -TABS) 100 MG tablet Take 1 tablet (100 mg total) by mouth 2 (two) times daily. 28 tablet 0   No facility-administered medications prior to visit.     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  Physical Exam       10/21/2024   10:34 AM 09/26/2023   11:53 AM 07/22/2023    4:10 PM 06/26/2023  2:47 PM 04/08/2023    3:02 PM  Depression screen PHQ 2/9  Decreased Interest 0 0 0 0 0  Down, Depressed, Hopeless 0 0 0 0 0  PHQ - 2 Score 0 0 0 0 0  Altered sleeping 0      Tired, decreased energy 1      Change in appetite 0      Feeling bad or failure about yourself  0      Trouble concentrating 0      Moving slowly or fidgety/restless 0      Suicidal thoughts 0      PHQ-9 Score 1       Difficult doing work/chores Somewhat difficult         Data saved with a previous flowsheet row definition        10/21/2024   10:35 AM  GAD 7 : Generalized Anxiety Score  Nervous, Anxious, on Edge 0  Control/stop worrying 0  Worry too much - different things 0   Trouble relaxing 1  Restless 0  Easily annoyed or irritable 3  Afraid - awful might happen 0  Total GAD 7 Score 4  Anxiety Difficulty Somewhat difficult     The ASCVD Risk score (Arnett DK, et al., 2019) failed to calculate for the following reasons:   The valid total cholesterol range is 130 to 320 mg/dL      Assessment & Plan:    Patient Active Problem List   Diagnosis Date Noted   Class 2 obesity due to excess calories without serious comorbidity with body mass index (BMI) of 39.0 to 39.9 in adult 10/21/2024   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   Neutropenia 06/26/2023   Rash 06/26/2023   Intractable back pain suspect Granix-related 02/21/2023   Lactic acidosis 02/21/2023   Thrombocytopenia 02/01/2023   Hyponatremia 01/31/2023   Hypokalemia 01/23/2023   Hematuria 01/11/2023   Neutropenic fever 01/10/2023   Pancytopenia (HCC) 01/10/2023   Anemia 01/01/2023   Healthcare maintenance 12/19/2022   Encounter for chemotherapy management 12/12/2022   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HCC) 12/10/2022   Decreased appetite 12/02/2022   Drug-induced constipation 11/21/2022   Nausea without vomiting 11/21/2022   Acute nonintractable headache 11/21/2022   Encounter for antineoplastic chemotherapy 11/18/2022   Burkitt lymphoma of lymph nodes of multiple regions [facility]) 11/14/2022   Counseling regarding advance care planning and goals of care 11/14/2022   Chest mass 11/10/2022   Exposure to chlamydia 10/24/2022   Submental lymphadenopathy 09/05/2022   Goals of care, counseling/discussion 07/26/2022   History of syphilis 07/26/2022   Neck swelling 07/26/2022   AIDS (acquired immune deficiency syndrome) (HCC) 07/25/2022     Problem List Items Addressed This Visit       Other   AIDS (acquired immune deficiency syndrome) (HCC) - Primary   Logan Ward continues to have well-controlled virus with good adherence and tolerance to Biktarvy .  Reviewed previous lab work and discussed plan of care and U  equals U.  No problems obtaining medication from the pharmacy and covered by Medicaid.  Social determinants of health reviewed with no interventions indicated.  Following discussion we will continue with current dose of Biktarvy .  Plan for follow-up in 4 months or sooner if needed with lab work on the same day.      Relevant Medications   bictegravir-emtricitabine -tenofovir  AF (BIKTARVY ) 50-200-25 MG TABS tablet   Healthcare maintenance   Discussed importance of safe sexual practice and condom use. Condoms and site specific STD testing offered.  Vaccinations  reviewed and declined following counseling.  Due for routine dental care which he will schedule.  Discussed doxyPEP and following discussion will start DoxyPEP as needed.       Secondary syphilis   Syphilis titer improved from 1:128 down to 1:4 indicating successful treatment. He did complete recent course of doxycycline  as well. No additional treatment needed at this time. Counseled on RPR testing results and when treatment would be considered.       Relevant Medications   bictegravir-emtricitabine -tenofovir  AF (BIKTARVY ) 50-200-25 MG TABS tablet     I have changed Logan Ward's doxycycline . I am also having him maintain his B-complex with vitamin C , ibuprofen , HYDROcodone -acetaminophen , and Biktarvy .   Meds ordered this encounter  Medications   bictegravir-emtricitabine -tenofovir  AF (BIKTARVY ) 50-200-25 MG TABS tablet    Sig: Take 1 tablet by mouth daily. Patient needs appt    Dispense:  30 tablet    Refill:  5    Please mail    Supervising Provider:   LUIZ CHANNEL [phone]    Lot Number?:   [id number]    Expiration Date?:   08/16/2024    Quantity:   28    Prescription Type::   Renewal   doxycycline  (VIBRA -TABS) 100 MG tablet    Sig: Take 2 tablets by mouth daily as needed and within 72 hours of sexual contact.    Dispense:  30 tablet    Refill:  1    Please mail    Supervising Provider:   LUIZ CHANNEL [[id number]]      Follow-up: Return in about 4 months (around 04/16/2025). or sooner if needed.    Logan July, MSN, FNP-C Nurse Practitioner [facility] Main number: [phone]      [1] No Known Allergies  "

## 2024-12-17 NOTE — Patient Instructions (Addendum)
 Nice to see you.  Continue to take your medication daily as prescribed.  Refills have been sent to the pharmacy.  Plan for follow up in 4 months or sooner if needed with lab work on the same day.  Have a great day and stay safe!

## 2024-12-17 NOTE — Assessment & Plan Note (Signed)
 Discussed importance of safe sexual practice and condom use. Condoms and site specific STD testing offered.  Vaccinations reviewed and declined following counseling.  Due for routine dental care which he will schedule.  Discussed doxyPEP and following discussion will start DoxyPEP as needed.

## 2024-12-17 NOTE — Assessment & Plan Note (Signed)
 Logan Ward continues to have well-controlled virus with good adherence and tolerance to Biktarvy .  Reviewed previous lab work and discussed plan of care and U equals U.  No problems obtaining medication from the pharmacy and covered by Medicaid.  Social determinants of health reviewed with no interventions indicated.  Following discussion we will continue with current dose of Biktarvy .  Plan for follow-up in 4 months or sooner if needed with lab work on the same day.

## 2024-12-17 NOTE — Progress Notes (Signed)
 1

## 2024-12-22 ENCOUNTER — Encounter: Payer: Self-pay | Admitting: Hematology

## 2024-12-22 ENCOUNTER — Other Ambulatory Visit: Payer: Self-pay

## 2024-12-24 ENCOUNTER — Other Ambulatory Visit: Payer: Self-pay

## 2024-12-24 NOTE — Progress Notes (Signed)
 Specialty Pharmacy Refill Coordination Note  Logan Ward is a 44 y.o. male contacted today regarding refills of specialty medication(s) Bictegravir-Emtricitab-Tenofov (Biktarvy )   Patient requested Delivery   Delivery date: 12/28/24   Verified address: [address] [name] [address]   Medication will be filled on: 12/27/24

## 2025-01-04 ENCOUNTER — Ambulatory Visit: Admitting: Family

## 2025-01-19 ENCOUNTER — Other Ambulatory Visit: Payer: Self-pay

## 2025-01-21 ENCOUNTER — Other Ambulatory Visit (HOSPITAL_COMMUNITY): Payer: Self-pay
